# Patient Record
Sex: Male | Born: 1935 | Race: White | Hispanic: No | Marital: Married | Smoking: Former smoker
Health system: Southern US, Community
[De-identification: ages and names within clinical notes are randomized; demographics above are authoritative.]

## PROBLEM LIST (undated history)

## (undated) DIAGNOSIS — G709 Myoneural disorder, unspecified: Secondary | ICD-10-CM

## (undated) DIAGNOSIS — J209 Acute bronchitis, unspecified: Secondary | ICD-10-CM

## (undated) DIAGNOSIS — D649 Anemia, unspecified: Secondary | ICD-10-CM

## (undated) DIAGNOSIS — I509 Heart failure, unspecified: Secondary | ICD-10-CM

## (undated) DIAGNOSIS — I2510 Atherosclerotic heart disease of native coronary artery without angina pectoris: Secondary | ICD-10-CM

## (undated) DIAGNOSIS — N400 Enlarged prostate without lower urinary tract symptoms: Secondary | ICD-10-CM

## (undated) DIAGNOSIS — E039 Hypothyroidism, unspecified: Secondary | ICD-10-CM

## (undated) DIAGNOSIS — C4490 Unspecified malignant neoplasm of skin, unspecified: Secondary | ICD-10-CM

## (undated) DIAGNOSIS — G4730 Sleep apnea, unspecified: Secondary | ICD-10-CM

## (undated) DIAGNOSIS — N189 Chronic kidney disease, unspecified: Secondary | ICD-10-CM

## (undated) DIAGNOSIS — E785 Hyperlipidemia, unspecified: Secondary | ICD-10-CM

## (undated) DIAGNOSIS — I10 Essential (primary) hypertension: Secondary | ICD-10-CM

## (undated) DIAGNOSIS — K219 Gastro-esophageal reflux disease without esophagitis: Secondary | ICD-10-CM

## (undated) DIAGNOSIS — R059 Cough, unspecified: Secondary | ICD-10-CM

## (undated) DIAGNOSIS — IMO0002 Reserved for concepts with insufficient information to code with codable children: Secondary | ICD-10-CM

## (undated) DIAGNOSIS — R05 Cough: Secondary | ICD-10-CM

## (undated) DIAGNOSIS — Z9289 Personal history of other medical treatment: Secondary | ICD-10-CM

## (undated) DIAGNOSIS — J440 Chronic obstructive pulmonary disease with acute lower respiratory infection: Secondary | ICD-10-CM

## (undated) DIAGNOSIS — T82198A Other mechanical complication of other cardiac electronic device, initial encounter: Secondary | ICD-10-CM

## (undated) DIAGNOSIS — C679 Malignant neoplasm of bladder, unspecified: Secondary | ICD-10-CM

## (undated) HISTORY — DX: Heart failure, unspecified: I50.9

## (undated) HISTORY — DX: Atherosclerotic heart disease of native coronary artery without angina pectoris: I25.10

## (undated) HISTORY — PX: CORONARY ANGIOPLASTY: SHX604

## (undated) HISTORY — DX: Chronic kidney disease, unspecified: N18.9

## (undated) HISTORY — PX: COLONOSCOPY: SHX174

## (undated) HISTORY — DX: Essential (primary) hypertension: I10

## (undated) HISTORY — DX: Hyperlipidemia, unspecified: E78.5

## (undated) HISTORY — PX: CARDIAC DEFIBRILLATOR PLACEMENT: SHX171

## (undated) HISTORY — DX: Gastro-esophageal reflux disease without esophagitis: K21.9

## (undated) HISTORY — PX: ESOPHAGOGASTRODUODENOSCOPY: SHX1529

## (undated) HISTORY — DX: Reserved for concepts with insufficient information to code with codable children: IMO0002

## (undated) HISTORY — DX: Other mechanical complication of other cardiac electronic device, initial encounter: T82.198A

---

## 1970-10-14 HISTORY — PX: BACK SURGERY: SHX140

## 1983-10-15 HISTORY — PX: CERVICAL LAMINECTOMY: SHX94

## 1987-10-15 HISTORY — PX: HERNIA REPAIR: SHX51

## 2001-10-26 ENCOUNTER — Encounter: Payer: Self-pay | Admitting: Family Medicine

## 2001-10-26 ENCOUNTER — Encounter: Admission: RE | Admit: 2001-10-26 | Discharge: 2001-10-26 | Payer: Self-pay | Admitting: Family Medicine

## 2001-11-27 ENCOUNTER — Ambulatory Visit (HOSPITAL_COMMUNITY): Admission: RE | Admit: 2001-11-27 | Discharge: 2001-11-27 | Payer: Self-pay | Admitting: Gastroenterology

## 2004-09-15 ENCOUNTER — Inpatient Hospital Stay (HOSPITAL_COMMUNITY): Admission: EM | Admit: 2004-09-15 | Discharge: 2004-09-27 | Payer: Self-pay | Admitting: Emergency Medicine

## 2004-09-15 ENCOUNTER — Ambulatory Visit: Payer: Self-pay | Admitting: *Deleted

## 2004-10-01 ENCOUNTER — Ambulatory Visit: Payer: Self-pay | Admitting: Cardiovascular Disease

## 2004-10-01 ENCOUNTER — Ambulatory Visit: Payer: Self-pay | Admitting: Cardiology

## 2004-10-06 ENCOUNTER — Encounter (HOSPITAL_COMMUNITY): Admission: RE | Admit: 2004-10-06 | Discharge: 2005-01-04 | Payer: Self-pay | Admitting: *Deleted

## 2004-10-09 ENCOUNTER — Ambulatory Visit: Payer: Self-pay | Admitting: Cardiovascular Disease

## 2004-10-10 ENCOUNTER — Ambulatory Visit: Payer: Self-pay | Admitting: *Deleted

## 2004-10-10 ENCOUNTER — Ambulatory Visit: Payer: Self-pay | Admitting: Cardiology

## 2004-10-10 ENCOUNTER — Ambulatory Visit: Payer: Self-pay

## 2004-10-10 ENCOUNTER — Inpatient Hospital Stay (HOSPITAL_COMMUNITY): Admission: AD | Admit: 2004-10-10 | Discharge: 2004-10-13 | Payer: Self-pay | Admitting: Cardiology

## 2004-10-17 ENCOUNTER — Ambulatory Visit: Payer: Self-pay | Admitting: Internal Medicine

## 2004-10-25 ENCOUNTER — Ambulatory Visit: Payer: Self-pay | Admitting: Physician Assistant

## 2004-11-08 ENCOUNTER — Ambulatory Visit: Payer: Self-pay | Admitting: *Deleted

## 2004-11-12 ENCOUNTER — Ambulatory Visit: Payer: Self-pay | Admitting: *Deleted

## 2004-11-19 ENCOUNTER — Ambulatory Visit: Payer: Self-pay | Admitting: *Deleted

## 2004-11-20 ENCOUNTER — Ambulatory Visit: Payer: Self-pay | Admitting: *Deleted

## 2004-11-20 ENCOUNTER — Ambulatory Visit (HOSPITAL_COMMUNITY): Admission: RE | Admit: 2004-11-20 | Discharge: 2004-11-21 | Payer: Self-pay | Admitting: *Deleted

## 2004-12-03 ENCOUNTER — Ambulatory Visit: Payer: Self-pay | Admitting: *Deleted

## 2005-01-01 ENCOUNTER — Ambulatory Visit: Payer: Self-pay | Admitting: *Deleted

## 2005-01-04 ENCOUNTER — Ambulatory Visit: Payer: Self-pay | Admitting: *Deleted

## 2005-01-05 ENCOUNTER — Encounter (HOSPITAL_COMMUNITY): Admission: RE | Admit: 2005-01-05 | Discharge: 2005-04-05 | Payer: Self-pay | Admitting: *Deleted

## 2005-01-07 ENCOUNTER — Encounter (INDEPENDENT_AMBULATORY_CARE_PROVIDER_SITE_OTHER): Payer: Self-pay | Admitting: Specialist

## 2005-01-07 ENCOUNTER — Ambulatory Visit (HOSPITAL_COMMUNITY): Admission: RE | Admit: 2005-01-07 | Discharge: 2005-01-07 | Payer: Self-pay | Admitting: Gastroenterology

## 2005-01-08 ENCOUNTER — Ambulatory Visit: Payer: Self-pay

## 2005-01-08 ENCOUNTER — Ambulatory Visit: Payer: Self-pay | Admitting: *Deleted

## 2005-02-06 ENCOUNTER — Ambulatory Visit: Payer: Self-pay | Admitting: Internal Medicine

## 2005-02-13 ENCOUNTER — Ambulatory Visit: Payer: Self-pay | Admitting: Internal Medicine

## 2005-02-19 ENCOUNTER — Ambulatory Visit: Payer: Self-pay | Admitting: Internal Medicine

## 2005-02-19 ENCOUNTER — Inpatient Hospital Stay (HOSPITAL_COMMUNITY): Admission: AD | Admit: 2005-02-19 | Discharge: 2005-02-20 | Payer: Self-pay | Admitting: Internal Medicine

## 2005-03-04 ENCOUNTER — Ambulatory Visit: Payer: Self-pay | Admitting: *Deleted

## 2005-03-04 ENCOUNTER — Ambulatory Visit: Payer: Self-pay | Admitting: Internal Medicine

## 2005-05-04 ENCOUNTER — Ambulatory Visit: Payer: Self-pay | Admitting: Cardiology

## 2005-05-05 ENCOUNTER — Inpatient Hospital Stay (HOSPITAL_COMMUNITY): Admission: EM | Admit: 2005-05-05 | Discharge: 2005-05-07 | Payer: Self-pay | Admitting: Emergency Medicine

## 2005-05-14 ENCOUNTER — Ambulatory Visit: Payer: Self-pay | Admitting: Internal Medicine

## 2005-05-20 ENCOUNTER — Ambulatory Visit: Admission: RE | Admit: 2005-05-20 | Discharge: 2005-05-20 | Payer: Self-pay | Admitting: Internal Medicine

## 2005-05-21 ENCOUNTER — Ambulatory Visit: Payer: Self-pay

## 2005-06-03 ENCOUNTER — Ambulatory Visit: Payer: Self-pay | Admitting: Internal Medicine

## 2005-06-27 ENCOUNTER — Ambulatory Visit: Payer: Self-pay | Admitting: Internal Medicine

## 2005-08-05 ENCOUNTER — Ambulatory Visit: Payer: Self-pay | Admitting: Internal Medicine

## 2005-08-06 ENCOUNTER — Ambulatory Visit: Payer: Self-pay | Admitting: Internal Medicine

## 2005-09-10 ENCOUNTER — Ambulatory Visit: Payer: Self-pay | Admitting: Internal Medicine

## 2005-09-13 ENCOUNTER — Ambulatory Visit: Payer: Self-pay | Admitting: Cardiovascular Disease

## 2005-12-18 ENCOUNTER — Ambulatory Visit: Payer: Self-pay | Admitting: Internal Medicine

## 2006-01-06 ENCOUNTER — Ambulatory Visit: Payer: Self-pay

## 2006-03-08 ENCOUNTER — Emergency Department (HOSPITAL_COMMUNITY): Admission: EM | Admit: 2006-03-08 | Discharge: 2006-03-08 | Payer: Self-pay | Admitting: Emergency Medicine

## 2006-03-21 ENCOUNTER — Ambulatory Visit: Payer: Self-pay | Admitting: Internal Medicine

## 2006-03-24 ENCOUNTER — Ambulatory Visit: Payer: Self-pay | Admitting: Internal Medicine

## 2006-03-28 ENCOUNTER — Ambulatory Visit: Admission: RE | Admit: 2006-03-28 | Discharge: 2006-03-28 | Payer: Self-pay | Admitting: Internal Medicine

## 2006-04-08 ENCOUNTER — Ambulatory Visit: Payer: Self-pay | Admitting: Internal Medicine

## 2006-04-21 ENCOUNTER — Ambulatory Visit: Payer: Self-pay | Admitting: Internal Medicine

## 2006-04-21 ENCOUNTER — Ambulatory Visit: Payer: Self-pay

## 2006-04-21 ENCOUNTER — Encounter: Payer: Self-pay | Admitting: Cardiology

## 2006-07-15 ENCOUNTER — Ambulatory Visit: Payer: Self-pay | Admitting: Internal Medicine

## 2006-09-23 ENCOUNTER — Ambulatory Visit: Payer: Self-pay | Admitting: Internal Medicine

## 2006-10-27 ENCOUNTER — Ambulatory Visit: Admission: RE | Admit: 2006-10-27 | Discharge: 2006-10-27 | Payer: Self-pay | Admitting: Internal Medicine

## 2006-11-06 ENCOUNTER — Ambulatory Visit: Payer: Self-pay

## 2006-11-10 ENCOUNTER — Ambulatory Visit: Payer: Self-pay | Admitting: Internal Medicine

## 2006-11-13 ENCOUNTER — Ambulatory Visit: Payer: Self-pay

## 2006-11-13 LAB — CONVERTED CEMR LAB
ALT: 47 units/L — ABNORMAL HIGH (ref 0–40)
Alkaline Phosphatase: 65 units/L (ref 39–117)
BUN: 26 mg/dL — ABNORMAL HIGH (ref 6–23)
Bilirubin, Direct: 0.2 mg/dL (ref 0.0–0.3)
Chloride: 107 meq/L (ref 96–112)
Creatinine, Ser: 2.1 mg/dL — ABNORMAL HIGH (ref 0.4–1.5)
Free T4: 1.1 ng/dL (ref 0.6–1.6)
GFR calc Af Amer: 40 mL/min
Glucose, Bld: 109 mg/dL — ABNORMAL HIGH (ref 70–99)
Potassium: 4.7 meq/L (ref 3.5–5.1)
Sodium: 141 meq/L (ref 135–145)
Total CHOL/HDL Ratio: 3.7
Total Protein: 6.5 g/dL (ref 6.0–8.3)
Triglycerides: 75 mg/dL (ref 0–149)

## 2006-12-24 ENCOUNTER — Ambulatory Visit: Payer: Self-pay | Admitting: Internal Medicine

## 2007-01-06 ENCOUNTER — Ambulatory Visit: Payer: Self-pay | Admitting: Internal Medicine

## 2007-02-24 ENCOUNTER — Ambulatory Visit: Payer: Self-pay | Admitting: Internal Medicine

## 2007-03-17 ENCOUNTER — Ambulatory Visit (HOSPITAL_COMMUNITY): Admission: RE | Admit: 2007-03-17 | Discharge: 2007-03-17 | Payer: Self-pay | Admitting: Internal Medicine

## 2007-03-18 ENCOUNTER — Ambulatory Visit: Payer: Self-pay | Admitting: Internal Medicine

## 2007-03-23 ENCOUNTER — Ambulatory Visit: Payer: Self-pay | Admitting: Internal Medicine

## 2007-04-07 ENCOUNTER — Ambulatory Visit: Payer: Self-pay | Admitting: Internal Medicine

## 2007-07-07 ENCOUNTER — Ambulatory Visit: Payer: Self-pay | Admitting: Internal Medicine

## 2007-08-18 ENCOUNTER — Ambulatory Visit: Payer: Self-pay | Admitting: Internal Medicine

## 2007-08-18 LAB — CONVERTED CEMR LAB
Digitoxin Lvl: 0.9 ng/mL (ref 0.8–2.0)
T3, Free: 2.4 pg/mL (ref 2.3–4.2)

## 2007-08-19 ENCOUNTER — Encounter: Payer: Self-pay | Admitting: Internal Medicine

## 2007-08-19 ENCOUNTER — Ambulatory Visit: Admission: RE | Admit: 2007-08-19 | Discharge: 2007-08-19 | Payer: Self-pay | Admitting: Internal Medicine

## 2007-08-19 ENCOUNTER — Ambulatory Visit: Payer: Self-pay

## 2007-08-25 ENCOUNTER — Ambulatory Visit: Payer: Self-pay | Admitting: Internal Medicine

## 2007-08-25 LAB — CONVERTED CEMR LAB
Creatinine, Ser: 1.6 mg/dL — ABNORMAL HIGH (ref 0.4–1.5)
GFR calc Af Amer: 55 mL/min
GFR calc non Af Amer: 45 mL/min
Potassium: 4.3 meq/L (ref 3.5–5.1)

## 2007-08-28 ENCOUNTER — Ambulatory Visit (HOSPITAL_COMMUNITY): Admission: RE | Admit: 2007-08-28 | Discharge: 2007-08-28 | Payer: Self-pay | Admitting: Internal Medicine

## 2007-09-04 ENCOUNTER — Ambulatory Visit (HOSPITAL_COMMUNITY): Admission: RE | Admit: 2007-09-04 | Discharge: 2007-09-04 | Payer: Self-pay | Admitting: Internal Medicine

## 2007-09-24 ENCOUNTER — Ambulatory Visit: Payer: Self-pay | Admitting: Internal Medicine

## 2007-10-06 ENCOUNTER — Emergency Department (HOSPITAL_COMMUNITY): Admission: EM | Admit: 2007-10-06 | Discharge: 2007-10-06 | Payer: Self-pay | Admitting: Emergency Medicine

## 2007-10-12 ENCOUNTER — Ambulatory Visit: Payer: Self-pay | Admitting: Cardiology

## 2007-10-12 LAB — CONVERTED CEMR LAB
BUN: 22 mg/dL (ref 6–23)
Calcium: 9.1 mg/dL (ref 8.4–10.5)
Chloride: 105 meq/L (ref 96–112)
GFR calc non Af Amer: 40 mL/min
Glucose, Bld: 106 mg/dL — ABNORMAL HIGH (ref 70–99)

## 2007-10-13 ENCOUNTER — Ambulatory Visit: Payer: Self-pay

## 2007-10-19 ENCOUNTER — Ambulatory Visit: Payer: Self-pay | Admitting: Internal Medicine

## 2007-10-19 LAB — CONVERTED CEMR LAB
CO2: 29 meq/L (ref 19–32)
Calcium: 8.8 mg/dL (ref 8.4–10.5)
Chloride: 104 meq/L (ref 96–112)
Creatinine, Ser: 1.8 mg/dL — ABNORMAL HIGH (ref 0.4–1.5)
Glucose, Bld: 104 mg/dL — ABNORMAL HIGH (ref 70–99)
Potassium: 4.6 meq/L (ref 3.5–5.1)
Pro B Natriuretic peptide (BNP): 224 pg/mL — ABNORMAL HIGH (ref 0.0–100.0)
Sodium: 138 meq/L (ref 135–145)

## 2007-11-02 ENCOUNTER — Ambulatory Visit: Payer: Self-pay | Admitting: Internal Medicine

## 2007-11-02 LAB — CONVERTED CEMR LAB
AST: 53 units/L — ABNORMAL HIGH (ref 0–37)
Albumin: 3.3 g/dL — ABNORMAL LOW (ref 3.5–5.2)
BUN: 25 mg/dL — ABNORMAL HIGH (ref 6–23)
Bilirubin, Direct: 0.2 mg/dL (ref 0.0–0.3)
CO2: 28 meq/L (ref 19–32)
Chloride: 103 meq/L (ref 96–112)
GFR calc Af Amer: 42 mL/min
Glucose, Bld: 112 mg/dL — ABNORMAL HIGH (ref 70–99)
Potassium: 4.8 meq/L (ref 3.5–5.1)
Sodium: 138 meq/L (ref 135–145)
Triglycerides: 85 mg/dL (ref 0–149)
VLDL: 17 mg/dL (ref 0–40)

## 2007-12-21 ENCOUNTER — Ambulatory Visit: Payer: Self-pay | Admitting: Internal Medicine

## 2008-01-25 ENCOUNTER — Ambulatory Visit: Payer: Self-pay | Admitting: Internal Medicine

## 2008-01-25 LAB — CONVERTED CEMR LAB
Basophils Absolute: 0.1 10*3/uL (ref 0.0–0.1)
Basophils Relative: 1.2 % — ABNORMAL HIGH (ref 0.0–1.0)
CO2: 30 meq/L (ref 19–32)
Calcium: 8.8 mg/dL (ref 8.4–10.5)
Creatinine, Ser: 1.6 mg/dL — ABNORMAL HIGH (ref 0.4–1.5)
Eosinophils Relative: 4 % (ref 0.0–5.0)
GFR calc Af Amer: 55 mL/min
Glucose, Bld: 86 mg/dL (ref 70–99)
Hemoglobin: 12.9 g/dL — ABNORMAL LOW (ref 13.0–17.0)
INR: 1 (ref 0.8–1.0)
Monocytes Absolute: 0.8 10*3/uL (ref 0.1–1.0)
Monocytes Relative: 13.9 % — ABNORMAL HIGH (ref 3.0–12.0)
Platelets: 228 10*3/uL (ref 150–400)
Prothrombin Time: 12.2 s (ref 10.9–13.3)
RDW: 13.2 % (ref 11.5–14.6)
WBC: 5.7 10*3/uL (ref 4.5–10.5)

## 2008-01-29 ENCOUNTER — Inpatient Hospital Stay (HOSPITAL_COMMUNITY): Admission: AD | Admit: 2008-01-29 | Discharge: 2008-01-31 | Payer: Self-pay | Admitting: Internal Medicine

## 2008-01-29 ENCOUNTER — Ambulatory Visit: Payer: Self-pay | Admitting: Internal Medicine

## 2008-02-11 ENCOUNTER — Ambulatory Visit: Payer: Self-pay | Admitting: Internal Medicine

## 2008-02-11 ENCOUNTER — Ambulatory Visit: Payer: Self-pay

## 2008-02-11 LAB — CONVERTED CEMR LAB
BUN: 23 mg/dL (ref 6–23)
Creatinine, Ser: 1.5 mg/dL (ref 0.4–1.5)
GFR calc Af Amer: 59 mL/min
GFR calc non Af Amer: 49 mL/min
Glucose, Bld: 114 mg/dL — ABNORMAL HIGH (ref 70–99)
Sodium: 141 meq/L (ref 135–145)

## 2008-04-13 ENCOUNTER — Ambulatory Visit: Payer: Self-pay | Admitting: Internal Medicine

## 2008-04-19 ENCOUNTER — Ambulatory Visit: Payer: Self-pay | Admitting: Internal Medicine

## 2008-04-25 ENCOUNTER — Ambulatory Visit: Payer: Self-pay | Admitting: Internal Medicine

## 2008-04-25 LAB — CONVERTED CEMR LAB
AST: 64 units/L — ABNORMAL HIGH (ref 0–37)
BUN: 23 mg/dL (ref 6–23)
Bilirubin, Direct: 0.1 mg/dL (ref 0.0–0.3)
CO2: 30 meq/L (ref 19–32)
Calcium: 8.8 mg/dL (ref 8.4–10.5)
Creatinine, Ser: 1.5 mg/dL (ref 0.4–1.5)
Digitoxin Lvl: 1.3 ng/mL (ref 0.8–2.0)
Glucose, Bld: 117 mg/dL — ABNORMAL HIGH (ref 70–99)
Potassium: 5 meq/L (ref 3.5–5.1)
TSH: 0.29 microintl units/mL — ABNORMAL LOW (ref 0.35–5.50)
Total Protein: 6.6 g/dL (ref 6.0–8.3)

## 2008-04-28 ENCOUNTER — Ambulatory Visit: Payer: Self-pay | Admitting: Internal Medicine

## 2008-04-28 LAB — CONVERTED CEMR LAB
Free T4: 1.7 ng/dL — ABNORMAL HIGH (ref 0.6–1.6)
T3, Free: 3.8 pg/mL (ref 2.3–4.2)

## 2008-06-28 ENCOUNTER — Ambulatory Visit: Payer: Self-pay | Admitting: Internal Medicine

## 2008-06-28 LAB — CONVERTED CEMR LAB
Free T4: 2.6 ng/dL — ABNORMAL HIGH (ref 0.6–1.6)
T3, Free: 3.9 pg/mL (ref 2.3–4.2)

## 2008-07-26 ENCOUNTER — Ambulatory Visit: Payer: Self-pay | Admitting: Internal Medicine

## 2008-08-01 ENCOUNTER — Ambulatory Visit (HOSPITAL_COMMUNITY): Admission: RE | Admit: 2008-08-01 | Discharge: 2008-08-01 | Payer: Self-pay | Admitting: Internal Medicine

## 2008-08-01 ENCOUNTER — Ambulatory Visit: Payer: Self-pay | Admitting: Internal Medicine

## 2008-08-03 ENCOUNTER — Encounter (HOSPITAL_COMMUNITY): Admission: RE | Admit: 2008-08-03 | Discharge: 2008-10-11 | Payer: Self-pay | Admitting: Internal Medicine

## 2008-09-23 ENCOUNTER — Ambulatory Visit: Payer: Self-pay | Admitting: Internal Medicine

## 2008-09-23 LAB — CONVERTED CEMR LAB: TSH: 0.05 microintl units/mL — ABNORMAL LOW (ref 0.35–5.50)

## 2008-09-27 ENCOUNTER — Ambulatory Visit: Payer: Self-pay | Admitting: Internal Medicine

## 2008-10-19 ENCOUNTER — Ambulatory Visit: Payer: Self-pay | Admitting: Internal Medicine

## 2008-10-31 ENCOUNTER — Ambulatory Visit: Payer: Self-pay | Admitting: Internal Medicine

## 2008-12-06 ENCOUNTER — Ambulatory Visit: Payer: Self-pay

## 2009-01-21 DIAGNOSIS — I4891 Unspecified atrial fibrillation: Secondary | ICD-10-CM

## 2009-01-21 DIAGNOSIS — Z95810 Presence of automatic (implantable) cardiac defibrillator: Secondary | ICD-10-CM

## 2009-01-24 ENCOUNTER — Encounter: Payer: Self-pay | Admitting: Internal Medicine

## 2009-01-24 ENCOUNTER — Ambulatory Visit: Payer: Self-pay | Admitting: Internal Medicine

## 2009-01-24 DIAGNOSIS — I255 Ischemic cardiomyopathy: Secondary | ICD-10-CM | POA: Insufficient documentation

## 2009-02-01 ENCOUNTER — Encounter: Payer: Self-pay | Admitting: Internal Medicine

## 2009-02-01 ENCOUNTER — Ambulatory Visit: Payer: Self-pay | Admitting: Internal Medicine

## 2009-02-01 DIAGNOSIS — I2510 Atherosclerotic heart disease of native coronary artery without angina pectoris: Secondary | ICD-10-CM

## 2009-04-24 ENCOUNTER — Ambulatory Visit: Payer: Self-pay | Admitting: Internal Medicine

## 2009-04-24 DIAGNOSIS — G629 Polyneuropathy, unspecified: Secondary | ICD-10-CM | POA: Insufficient documentation

## 2009-04-24 DIAGNOSIS — K219 Gastro-esophageal reflux disease without esophagitis: Secondary | ICD-10-CM | POA: Insufficient documentation

## 2009-04-24 DIAGNOSIS — G4700 Insomnia, unspecified: Secondary | ICD-10-CM | POA: Insufficient documentation

## 2009-05-01 ENCOUNTER — Encounter: Payer: Self-pay | Admitting: Internal Medicine

## 2009-05-10 ENCOUNTER — Telehealth: Payer: Self-pay | Admitting: Internal Medicine

## 2009-05-16 ENCOUNTER — Ambulatory Visit: Payer: Self-pay

## 2009-05-16 ENCOUNTER — Encounter: Payer: Self-pay | Admitting: Internal Medicine

## 2009-05-25 ENCOUNTER — Ambulatory Visit: Payer: Self-pay | Admitting: Internal Medicine

## 2009-05-25 DIAGNOSIS — E782 Mixed hyperlipidemia: Secondary | ICD-10-CM

## 2009-05-26 ENCOUNTER — Telehealth: Payer: Self-pay | Admitting: Internal Medicine

## 2009-06-01 ENCOUNTER — Telehealth: Payer: Self-pay | Admitting: Internal Medicine

## 2009-06-05 ENCOUNTER — Telehealth: Payer: Self-pay | Admitting: Internal Medicine

## 2009-06-07 ENCOUNTER — Ambulatory Visit: Payer: Self-pay | Admitting: Internal Medicine

## 2009-06-08 ENCOUNTER — Encounter: Payer: Self-pay | Admitting: Internal Medicine

## 2009-06-14 ENCOUNTER — Encounter: Payer: Self-pay | Admitting: Internal Medicine

## 2009-06-14 ENCOUNTER — Telehealth: Payer: Self-pay | Admitting: Internal Medicine

## 2009-06-21 ENCOUNTER — Telehealth: Payer: Self-pay | Admitting: Internal Medicine

## 2009-06-26 DIAGNOSIS — E039 Hypothyroidism, unspecified: Secondary | ICD-10-CM | POA: Insufficient documentation

## 2009-08-08 ENCOUNTER — Ambulatory Visit: Payer: Self-pay | Admitting: Internal Medicine

## 2009-08-08 DIAGNOSIS — R079 Chest pain, unspecified: Secondary | ICD-10-CM | POA: Insufficient documentation

## 2009-09-14 ENCOUNTER — Ambulatory Visit: Payer: Self-pay | Admitting: Internal Medicine

## 2009-09-14 DIAGNOSIS — I5022 Chronic systolic (congestive) heart failure: Secondary | ICD-10-CM

## 2009-09-22 ENCOUNTER — Ambulatory Visit: Payer: Self-pay | Admitting: Internal Medicine

## 2009-09-22 DIAGNOSIS — I10 Essential (primary) hypertension: Secondary | ICD-10-CM | POA: Insufficient documentation

## 2009-09-25 LAB — CONVERTED CEMR LAB
Calcium: 8.8 mg/dL (ref 8.4–10.5)
Creatinine, Ser: 1.4 mg/dL (ref 0.4–1.5)
Sodium: 141 meq/L (ref 135–145)

## 2009-10-18 ENCOUNTER — Telehealth: Payer: Self-pay | Admitting: Internal Medicine

## 2009-10-27 ENCOUNTER — Telehealth: Payer: Self-pay | Admitting: Internal Medicine

## 2009-11-05 ENCOUNTER — Encounter: Payer: Self-pay | Admitting: Internal Medicine

## 2009-11-06 ENCOUNTER — Ambulatory Visit: Payer: Self-pay | Admitting: Internal Medicine

## 2009-11-15 ENCOUNTER — Encounter: Payer: Self-pay | Admitting: Internal Medicine

## 2010-01-10 ENCOUNTER — Telehealth: Payer: Self-pay | Admitting: Internal Medicine

## 2010-01-16 ENCOUNTER — Ambulatory Visit: Payer: Self-pay | Admitting: Internal Medicine

## 2010-02-04 ENCOUNTER — Encounter: Payer: Self-pay | Admitting: Internal Medicine

## 2010-02-05 ENCOUNTER — Telehealth: Payer: Self-pay | Admitting: Internal Medicine

## 2010-02-05 ENCOUNTER — Ambulatory Visit: Payer: Self-pay | Admitting: Internal Medicine

## 2010-02-09 ENCOUNTER — Encounter: Payer: Self-pay | Admitting: Internal Medicine

## 2010-05-08 ENCOUNTER — Telehealth (INDEPENDENT_AMBULATORY_CARE_PROVIDER_SITE_OTHER): Payer: Self-pay | Admitting: *Deleted

## 2010-05-11 ENCOUNTER — Encounter: Payer: Self-pay | Admitting: Internal Medicine

## 2010-05-17 ENCOUNTER — Ambulatory Visit: Payer: Self-pay | Admitting: Internal Medicine

## 2010-05-17 ENCOUNTER — Encounter: Payer: Self-pay | Admitting: Internal Medicine

## 2010-05-30 ENCOUNTER — Ambulatory Visit: Payer: Self-pay | Admitting: Internal Medicine

## 2010-05-31 ENCOUNTER — Encounter: Payer: Self-pay | Admitting: Internal Medicine

## 2010-07-24 ENCOUNTER — Ambulatory Visit: Payer: Self-pay | Admitting: Internal Medicine

## 2010-09-04 ENCOUNTER — Encounter: Payer: Self-pay | Admitting: Internal Medicine

## 2010-09-04 DIAGNOSIS — I739 Peripheral vascular disease, unspecified: Secondary | ICD-10-CM | POA: Insufficient documentation

## 2010-09-13 ENCOUNTER — Ambulatory Visit: Payer: Self-pay | Admitting: Internal Medicine

## 2010-09-14 ENCOUNTER — Encounter: Payer: Self-pay | Admitting: Internal Medicine

## 2010-09-19 ENCOUNTER — Ambulatory Visit: Payer: Self-pay | Admitting: Internal Medicine

## 2010-09-20 ENCOUNTER — Encounter: Payer: Self-pay | Admitting: Internal Medicine

## 2010-10-25 ENCOUNTER — Encounter: Payer: Self-pay | Admitting: Internal Medicine

## 2010-10-25 ENCOUNTER — Ambulatory Visit
Admission: RE | Admit: 2010-10-25 | Discharge: 2010-10-25 | Payer: Self-pay | Source: Home / Self Care | Attending: Internal Medicine | Admitting: Internal Medicine

## 2010-11-11 LAB — CONVERTED CEMR LAB
AST: 20 units/L (ref 0–37)
Albumin: 3.8 g/dL (ref 3.5–5.2)
BUN: 23 mg/dL (ref 6–23)
Bilirubin, Direct: 0.2 mg/dL (ref 0.0–0.3)
Calcium: 8.9 mg/dL (ref 8.4–10.5)
Chloride: 105 meq/L (ref 96–112)
Creatinine, Ser: 1.5 mg/dL (ref 0.4–1.5)
Creatinine, Ser: 1.5 mg/dL (ref 0.4–1.5)
Glucose, Bld: 96 mg/dL (ref 70–99)
Potassium: 4.4 meq/L (ref 3.5–5.1)
Pro B Natriuretic peptide (BNP): 149 pg/mL — ABNORMAL HIGH (ref 0.0–100.0)
Sodium: 141 meq/L (ref 135–145)
TSH: 1.12 microintl units/mL (ref 0.35–5.50)
Total Protein: 6.7 g/dL (ref 6.0–8.3)

## 2010-11-13 NOTE — Progress Notes (Signed)
Summary: noise on ICD  Phone Note Outgoing Call Call back at [facility] Phone [phone]   Call placed by: Chanetta Marshall RN BSN,  February 05, 2010 10:49 AM Summary of Call: Spoke with patient's wife.  Noise on ICD leads that corresponded with appt at [facility] with Dr Link Snuffer.  During procedure, noise was picked up that inhibited pacing and pt had an aborted shock for sensing of close intervals.  Wife aware that they should use a magnet from now on during these procedures to help prevent the possibility of inappropriate therapy.  Called patient and left message on machine for [facility] to call to discuss. Chanetta Marshall RN BSN  February 05, 2010 10:50 AM   Follow-up for Phone Call        [facility].  she is aware of issues.  Chanetta Marshall RN BSN  February 05, 2010 11:13 AM

## 2010-11-13 NOTE — Cardiovascular Report (Signed)
Summary: Office Visit Remote   Office Visit Remote   Imported By: Sallee Provencal 11/20/2009 16:40:27  _____________________________________________________________________  External Attachment:    Type:   Image     Comment:   External Document

## 2010-11-13 NOTE — Progress Notes (Signed)
Summary: needs help with transmission  Phone Note Call from Patient Call back at Home Phone [phone]   Caller: Spouse Reason for Call: Talk to Nurse, Talk to Doctor Summary of Call: pt got a letter regarding his device check and is a little confused at how he is suppose do it and needs a call Initial call taken by: Shelda Pal,  May 08, 2010 12:54 PM  Follow-up for Phone Call        spoke with wife, patient has wireless device and will transmitt automatically on the 28th.   Follow-up by: Alma Friendly, LPN,  July 26, 624THL 624THL PM

## 2010-11-13 NOTE — Miscellaneous (Signed)
Summary: Orders Update pft charges  Clinical Lists Changes  Orders: Added new Service order of Carbon Monoxide diffusing w/capacity (94720) - Signed Added new Service order of Lung Volumes (94240) - Signed Added new Service order of Spirometry (Pre & Post) (94060) - Signed 

## 2010-11-13 NOTE — Assessment & Plan Note (Signed)
Summary: per check out/sf   Visit Type:  Follow-up Referring Provider:  Rhylen Shaheen Primary Provider:  [name]  CC:  sob only with exertion.  History of Present Illness: [name] is delightful 77 year old male with a history of coronary artery disease, status post previous large anterior wall myocardial infarction.  This has been complicated by congestive heart failure.  Ejection fraction of 25%.  He is status post placement of a St. Jude BiV ICD.  He also has had multivessel stenting in the past. Remainder of his medical history is notable for atrial fibrillation,maintaining sinus rhythm on amiodarone.  He is not on Coumadin secondary to GI bleed, chronic renal insufficiency with baseline creatinine about 1.5-2.0, hypertension, hyperlipidemia, and a systemic tremor. He returns today with his wife for routine f/u.   At his last visit we increased Coreg. He was able to do this slowly without too much problem. Previously unable to tolerate increase in Diovan due to near syncope.  Continues to do well. Can walk around [facility] 3-4x without problem. Tolerating meds well. No orthopnea, PND or edema. No ICD shocks. Wife feels he is doing much better. mild exertional dyspnea.  Current Medications (verified): 1)  Pacerone 200 Mg Tabs (Amiodarone Hcl) .... Take 1/2 Tablet By Mouth Once Daily 2)  Carvedilol 6.25 Mg Tabs (Carvedilol) .... Take 2 By Mouth Two Times A Day 3)  Lasix 20 Mg Tabs (Furosemide) .... Take One Tablet By Mouth Once Daily. 4)  Nitroglycerin 0.4 Mg Subl (Nitroglycerin) .... Place 1 Tablet Under Tongue As Directed 5)  Potassium Chloride Cr 10 Meq Cr-Tabs (Potassium Chloride) .... Take 1 Tablet By Mouth Once A  Day 6)  Lanoxin 0.125 Mg Tabs (Digoxin) .... 1/2 Tab Once Daily 7)  Lipitor 80 Mg Tabs (Atorvastatin Calcium) .Marland Kitchen.. 1 Tab Once Daily (Changing To Crestor) 8)  Plavix 75 Mg Tabs (Clopidogrel Bisulfate) .Marland Kitchen.. 1 Tab Once Daily 9)  Protonix 40 Mg Tbec (Pantoprazole Sodium) .... Take 1  By Mouth Once Daily 10)  Zetia 10 Mg Tabs (Ezetimibe) .Marland Kitchen.. 1 Once Daily 11)  Centrum Silver  Tabs (Multiple Vitamins-Minerals) .Marland Kitchen.. 1 Tab Once Daily 12)  Cvs Iron 325 (65 Fe) Mg Tabs (Ferrous Sulfate) .... 3 Times A Week 13)  Diovan 80 Mg Tabs (Valsartan) .... Take 1 Tablet By Mouth Once A Day 14)  Aspirin Ec 325 Mg Tbec (Aspirin) .... Take One Tablet By Mouth Daily 15)  Synthroid 50 Mcg Tabs (Levothyroxine Sodium) .... Take 1 By Mouth Once Daily  Allergies (verified): 1)  ! Amoxicillin 2)  ! Penicillin 3)  Altace  Past History:  Past Medical History: Last updated: 09/14/2009 1. CAD    a. s/p anterior MI 12/05 c/b shock -> stent LAD    b. s/p stenting OM-1, 2/06 2. CHF due to ischemic CM    a. EF 20-30%. (Nov 2008)    b. s/p St. Jude BiV-ICD    c. CPX 07/2008  pvo2 16.3 (63% predicted) slope 34 RER 1.08 O2 pulse 93% 3. h/o AF maintaining sinus on amiodarone     --PFTS, TFTs ok 8/10 4. h/o large GIB - refuses coumadin 5. CRI (baseline 2.0-2.2) 6. HTN 7. Hyperlipidemia 8. GERD        Review of Systems       As per HPI and past medical history; otherwise all systems negative.   Vital Signs:  Patient profile:   77 year old male Height:      71 inches Weight:  184 pounds BMI:     25.76 Pulse rate:   74 / minute BP sitting:   122 / 60  (left arm) Cuff size:   regular  Vitals Entered By: Mignon Pine, RMA (January 16, 2010 9:19 AM)  Physical Exam  General:  General:  Gen: well appearing. no resp difficulty HEENT: normal Neck: supple. JVP 5-6. Carotids 2+ bilat; no bruits. Cor: PMI laterally displaced. Regular rate & rhythm. No rubs, gallops, 2/6 SEM murmur at apex Lungs: clear with decreased air movement (mild) Abdomen: soft, nontender, nondistended. No hepatosplenomegaly. No bruits or masses. Good bowel sounds. Extremities: no cyanosis, clubbing, rash, edema Neuro: alert & orientedx3, cranial nerves grossly intact. moves all 4 extremities w/o difficulty.  affect pleasant     ICD Specifications Following MD:  Virl Axe, MD     ICD Vendor:  St Jude     ICD Model Number:  [id number]     ICD Serial Number:  [id number] ICD DOI:  01/29/2008     ICD Implanting MD:  Virl Axe, MD  Lead 1:    Location: RA     DOI: 02/19/2005     Model #: [id number]     Serial #: [id number]     Status: active Lead 2:    Location: RV     DOI: 02/19/2005     Model #: [id number]     Serial #: [id number]     Status: active Lead 3:    Location: LV     DOI: 01/29/2008     Model #: [id number]     Serial #: [id number]     Status: active  Indications::  ICM, CHF   ICD Follow Up ICD Dependent:  No       ICD Device Measurements Configuration: BIPOLAR  Episodes Coumadin:  No  Brady Parameters Mode DDDR     Lower Rate Limit:  60     Upper Rate Limit 120 PAV 180     Sensed AV Delay:  130 Rate Response Parameters:  SLOPE >12 FOR LACK OF ENERGY  Tachy Zones VF:  250     VT:  210     VT1:  171     Impression & Recommendations:  Problem # 1:  SYSTOLIC HEART FAILURE, CHRONIC (ICD-428.22) Doing very well. NYHA Class II. Volume status looks good. Continue current meds. Unable to titrate Diovan due to hypotension. Will attempt to titrate Coreg slowly again at next visit.  Problem # 2:  CAD, NATIVE VESSEL (ICD-414.01) Stable. No evidence of ischemia. Continue current regimen.  Problem # 3:  ATRIAL FIBRILLATION (ICD-427.31) Doing well. Maintaining SR on amio. Surveillance up to date. not coumadin candidate due to GIB. Continue ASA/plavix.  Patient Instructions: 1)  Follow up in 4 months

## 2010-11-13 NOTE — Letter (Signed)
Summary: Device-Delinquent Phone Proofreader, [place]  [address] [place]   [place], [place] [address]   Phone: [phone]  Fax: [phone]     May 11, 2010 MRN: [id number]   [name], [place]  [id number]   Dear [name],  According to our records, you were scheduled for a device phone transmission on 05-10-2010.     We did not receive any results from this check.  If you transmitted on your scheduled day, please call us to help troubleshoot your system.  If you forgot to send your transmission, please send one upon receipt of this letter.  Thank you,   [facility]

## 2010-11-13 NOTE — Progress Notes (Signed)
Summary: pt having dizzness from diovan  Phone Note Call from Patient Call back at Home Phone [phone]   Caller: Patient Summary of Call: pt is having dizzness and he is taking diovan and the pt want to go back to taking  1 a day and stop taking 1 1/2 of diovan pt would like a call today. Initial call taken by: Delsa Sale,  October 27, 2009 4:30 PM  Follow-up for Phone Call        pt has been having some dizzy spells, BP has been ok, per Dr Haroldine Laws ok to go back to diovan 80mg  daily, pts wife aware Kevan Rosebush, RN  October 27, 2009 4:47 PM     New/Updated Medications: DIOVAN 80 MG TABS (VALSARTAN) Take 1 tablet by mouth once a day

## 2010-11-13 NOTE — Progress Notes (Signed)
Summary: prior auth for diovan  Phone Note Outgoing Call   Summary of Call: received prior auth for Diovan from phamacy, called [employer] at [phone] and it was approved, pharmacy and pt's wife aware  Initial call taken by: Kevan Rosebush, RN,  October 18, 2009 4:52 PM   New Allergies: ALTACE New Allergies: ALTACE

## 2010-11-13 NOTE — Procedures (Signed)
Summary: Cardiology Device Clinic   Allergies: 1)  ! Amoxicillin 2)  ! Penicillin 3)  Altace   ICD Specifications Following MD:  Virl Axe, MD     ICD Vendor:  St Jude     ICD Model Number:  [id number]     ICD Serial Number:  [id number] ICD DOI:  01/29/2008     ICD Implanting MD:  Virl Axe, MD  Lead 1:    Location: RA     DOI: 02/19/2005     Model #: [id number]     Serial #: [id number]     Status: active Lead 2:    Location: RV     DOI: 02/19/2005     Model #: [id number]     Serial #: [id number]     Status: active Lead 3:    Location: LV     DOI: 01/29/2008     Model #: [id number]     Serial #: [id number]     Status: active  Indications::  ICM, CHF   ICD Follow Up Remote Check?  No Battery Voltage:  2.93 V     Charge Time:  11.3 seconds     Battery Est. Longevity:  2.9 years Underlying rhythm:  Brady ICD Dependent:  No       ICD Device Measurements Atrium:  Amplitude: 1.9 mV, Impedance: 490 ohms, Threshold: 0.75 V at 0.5 msec Right Ventricle:  Amplitude: 10.3 mV, Impedance: 400 ohms, Threshold: 1.5 V at 0.5 msec Left Ventricle:  Impedance: 750 ohms, Threshold: 0.75 V at 0.8 msec Configuration: BIPOLAR Shock Impedance: 50 ohms   Episodes MS Episodes:  1     Percent Mode Switch:  <1%     Coumadin:  No Shock:  0     ATP:  0     Nonsustained:  0      Brady Parameters Mode DDDR     Lower Rate Limit:  60     Upper Rate Limit 120 PAV 180     Sensed AV Delay:  130 Rate Response Parameters:  SLOPE >12 FOR LACK OF ENERGY  Tachy Zones VF:  250     VT:  210     VT1:  171     Next Remote Date:  08/16/2010     Tech Comments:  RV reprogrammed 3.0@ 0.5.  Merlin transmissions every 3 months.   Alma Friendly, LPN  August  4, 624THL 11:58 AM

## 2010-11-13 NOTE — Letter (Signed)
Summary: Remote Device Check  [employer] [place]  [id number] [place]   [place], [place] [address]   Phone: [phone]  Fax: [phone]     November 15, 2009 MRN: [id number]   [name],   [id number]   Dear [name],   Your remote transmission was recieved and reviewed by your physician.  All diagnostics were within normal limits for you.  __X___Your next transmission is scheduled for:   February 05, 2010.  Please transmit at any time this day.  If you have a wireless device your transmission will be sent automatically.      Sincerely,  Hotel manager

## 2010-11-13 NOTE — Progress Notes (Signed)
Summary: PHARMACY CALLING WITH QUESTIONS ABOUT LIPITOR  Phone Note From Pharmacy   Caller: [facility]/ [phone] OPT 1 Summary of Call: PHARMACY HAVE QUESTION ABOUT LIPITOR 80MG  Initial call taken by: Delsa Sale,  January 10, 2010 11:12 AM  Follow-up for Phone Call        needs to try Lovastatin, Crestor,Vytorin, or Pravastatin before ins will pay for lipitor  Ref # [id number] Follow-up by: Mignon Pine, RMA,  January 10, 2010 1:10 PM  Additional Follow-up for Phone Call Additional follow up Details #1::        per Dr Haroldine Laws crestor probably ok but needs to come from Dr Virgina Jock who has been following cholesterol, [facility] aware they will cancel order for Lipitor, pts wife aware she will contact Dr [place] [employer] office for further recommendations Kevan Rosebush, RN  January 11, 2010 2:27 PM

## 2010-11-13 NOTE — Assessment & Plan Note (Signed)
Summary: device/saf   Visit Type:  Follow-up Referring Provider:  Bensimhon Primary Provider:  [name]   History of Present Illness:  [name] is seen in followup for congestive heart failure and is in ischemic heart disease with anterior MI w d  left ventricular functioe stimated at 25% He is status post CRT-D implantation.  He also has a history of paroxysmal atrial fibrillation for which he takes amiodarone  surveilllance underrtaken in Aug showed normal LFT, TSH as well as corrected DLCO.  Dr Db plans to recheck in a few months  The patient denies SOB, chest pain, edema or palpitations          Current Medications (verified): 1)  Pacerone 200 Mg Tabs (Amiodarone Hcl) .... Take 1/2 Tablet By Mouth Once Daily 2)  Carvedilol 6.25 Mg Tabs (Carvedilol) .... Take 2 By Mouth Two Times A Day 3)  Lasix 20 Mg Tabs (Furosemide) .... Take One Tablet By Mouth Once Daily. 4)  Nitroglycerin 0.4 Mg Subl (Nitroglycerin) .... Place 1 Tablet Under Tongue As Directed 5)  Potassium Chloride Cr 10 Meq Cr-Tabs (Potassium Chloride) .... Take 1 Tablet By Mouth Once A  Day 6)  Lanoxin 0.125 Mg Tabs (Digoxin) .... 1/2 Tab Once Daily 7)  Crestor 20 Mg Tabs (Rosuvastatin Calcium) .... Take One Tablet By Mouth Daily. 8)  Plavix 75 Mg Tabs (Clopidogrel Bisulfate) .Marland Kitchen.. 1 Tab Once Daily 9)  Protonix 40 Mg Tbec (Pantoprazole Sodium) .... Take 1 By Mouth Once Daily 10)  Zetia 10 Mg Tabs (Ezetimibe) .Marland Kitchen.. 1 Once Daily 11)  Centrum Silver  Tabs (Multiple Vitamins-Minerals) .Marland Kitchen.. 1 Tab Once Daily 12)  Cvs Iron 325 (65 Fe) Mg Tabs (Ferrous Sulfate) .... 3 Times A Week 13)  Diovan 80 Mg Tabs (Valsartan) .... Take 1 Tablet By Mouth Once A Day 14)  Aspirin Ec 325 Mg Tbec (Aspirin) .... Take One Tablet By Mouth Daily 15)  Synthroid 50 Mcg Tabs (Levothyroxine Sodium) .... Take 1 By Mouth Once Daily  Allergies: 1)  ! Amoxicillin 2)  ! Penicillin 3)  Altace  Past History:  Past Medical History: Last  updated: 09/14/2009 1. CAD    a. s/p anterior MI 12/05 c/b shock -> stent LAD    b. s/p stenting OM-1, 2/06 2. CHF due to ischemic CM    a. EF 20-30%. (Nov 2008)    b. s/p St. Jude BiV-ICD    c. CPX 07/2008  pvo2 16.3 (63% predicted) slope 34 RER 1.08 O2 pulse 93% 3. h/o AF maintaining sinus on amiodarone     --PFTS, TFTs ok 8/10 4. h/o large GIB - refuses coumadin 5. CRI (baseline 2.0-2.2) 6. HTN 7. Hyperlipidemia 8. GERD        Vital Signs:  Patient profile:   77 year old male Height:      71 inches Weight:      179 pounds BMI:     25.06 Pulse rate:   60 / minute BP sitting:   104 / 62  (left arm)  Vitals Entered By: Margaretmary Bayley CMA (July 24, 2010 11:13 AM)  Physical Exam  General:  well appearing. no resp difficulty HEENT: normal Neck: supple. JVP flatCarotids 2+ bilat; no bruits. Cor: PMI laterally displaced. Regular rate & rhythm. No rubs, gallops, 2/6 SEM murmur at apex Lungs: clear with decreased air movement (mild) Abdomen: soft, nontender, nondistended. No hepatosplenomegaly. No bruits or masses. Good bowel sounds. Extremities: no cyanosis, clubbing, rash, edema Neuro: alert & orientedx3, cranial nerves grossly  intact. moves all 4 extremities w/o difficulty. affect pleasant device pocket somewhat retracted but skin moves easily acroos the surface     ICD Specifications Following MD:  Virl Axe, MD     ICD Vendor:  St Jude     ICD Model Number:  [id number]     ICD Serial Number:  [id number] ICD DOI:  01/29/2008     ICD Implanting MD:  Virl Axe, MD  Lead 1:    Location: RA     DOI: 02/19/2005     Model #: [id number]     Serial #: [id number]     Status: active Lead 2:    Location: RV     DOI: 02/19/2005     Model #: [id number]     Serial #: [id number]     Status: active Lead 3:    Location: LV     DOI: 01/29/2008     Model #: [id number]     Serial #: [id number]     Status: active  Indications::  ICM, CHF   ICD Follow Up Remote Check?  No Battery Voltage:  2.90 V     Charge  Time:  11.3 seconds     Battery Est. Longevity:  3.3 YEARS Underlying rhythm:  SR ICD Dependent:  No       ICD Device Measurements Atrium:  Amplitude: 3.7 mV, Impedance: 530 ohms, Threshold: 0.75 V at 0.5 msec Right Ventricle:  Amplitude: 11.4 mV, Impedance: 390 ohms, Threshold: 1.25 V at 0.5 msec Left Ventricle:  Impedance: 740 ohms, Threshold: 1.0 V at 0.8 msec Configuration: BIPOLAR Shock Impedance: 49 ohms   Episodes MS Episodes:  0     Percent Mode Switch:  0     Coumadin:  No Shock:  0     ATP:  0     Nonsustained:  0     Atrial Pacing:  99%     Ventricular Pacing:  99%  Brady Parameters Mode DDDR     Lower Rate Limit:  60     Upper Rate Limit 120 PAV 180     Sensed AV Delay:  130 Rate Response Parameters:  SLOPE >12 FOR LACK OF ENERGY  Tachy Zones VF:  250     VT:  210     VT1:  171     Next Remote Date:  10/25/2010     Next Cardiology Appt Due:  07/15/2011 Tech Comments:  No parameter changes.  Device function normal.  Merlin transmissions every 3 months.  ROV 1 year with Dr. Caryl Comes. Alma Friendly, LPN  October 11, 624THL 11:23 AM   Impression & Recommendations:  Problem # 1:  SYSTOLIC HEART FAILURE, CHRONIC (ICD-428.22)  stable  His updated medication list for this problem includes:    Pacerone 200 Mg Tabs (Amiodarone hcl) .Marland Kitchen... Take 1/2 tablet by mouth once daily    Carvedilol 6.25 Mg Tabs (Carvedilol) .Marland Kitchen... Take 2 by mouth two times a day    Lasix 20 Mg Tabs (Furosemide) .Marland Kitchen... Take one tablet by mouth once daily.    Nitroglycerin 0.4 Mg Subl (Nitroglycerin) .Marland Kitchen... Place 1 tablet under tongue as directed    Lanoxin 0.125 Mg Tabs (Digoxin) .Marland Kitchen... 1/2 tab once daily    Plavix 75 Mg Tabs (Clopidogrel bisulfate) .Marland Kitchen... 1 tab once daily    Diovan 80 Mg Tabs (Valsartan) .Marland Kitchen... Take 1 tablet by mouth once a day    Aspirin Ec 325 Mg Tbec (Aspirin) .Marland Kitchen... Take one tablet by mouth daily  Problem # 2:  ATRIAL FIBRILLATION (ICD-427.31) no recurrent AF,  tests look good..Modest decrease  in DLCO apparently normal for corrected alveolar volume His updated medication list for this problem includes:    Pacerone 200 Mg Tabs (Amiodarone hcl) .Marland Kitchen... Take 1/2 tablet by mouth once daily    Carvedilol 6.25 Mg Tabs (Carvedilol) .Marland Kitchen... Take 2 by mouth two times a day    Lanoxin 0.125 Mg Tabs (Digoxin) .Marland Kitchen... 1/2 tab once daily    Plavix 75 Mg Tabs (Clopidogrel bisulfate) .Marland Kitchen... 1 tab once daily    Aspirin Ec 325 Mg Tbec (Aspirin) .Marland Kitchen... Take one tablet by mouth daily  Problem # 3:  CARDIOMYOPATHY, ISCHEMIC (ICD-414.8) no chest pain   Problem # 4:  ICD - IN SITU (ICD-V45.02) Device parameters and data were reviewed and no changes were made  Patient Instructions: 1)  Your physician recommends that you continue on your current medications as directed. Please refer to the Current Medication list given to you today. 2)  Your physician wants you to follow-up in: 1year  You will receive a reminder letter in the mail two months in advance. If you don't receive a letter, please call our office to schedule the follow-up appointment.

## 2010-11-13 NOTE — Assessment & Plan Note (Signed)
Summary: f49m   Visit Type:  Follow-up Referring Provider:  Bensimhon Primary Provider:  [name]  CC:  no complaints.  History of Present Illness: [name] is delightful 77 year old male with a history of coronary artery disease, status post previous large anterior wall myocardial infarction.  This has been complicated by congestive heart failure.  Ejection fraction of 25%.  He is status post placement of a St. Jude BiV ICD.  He also has had multivessel stenting in the past. Remainder of his medical history is notable for atrial fibrillation,maintaining sinus rhythm on amiodarone.  He is not on Coumadin secondary to GI bleed, chronic renal insufficiency with baseline creatinine about 1.5-2.0, hypertension, hyperlipidemia, and a systemic tremor. He returns today with his wife for routine f/u.   We have been trying to titrate Coreg and also restarted Diovan which was stopped in the past due to near syncope.  Continues to do well. Can walk around [place] 3-4x without problem. Tolerating meds well. No orthopnea, PND or edema. No ICD shocks. Wife feels he is doing much better. mild exertional dyspnea.  Current Medications (verified): 1)  Pacerone 200 Mg Tabs (Amiodarone Hcl) .... Take 1/2 Tablet By Mouth Once Daily 2)  Carvedilol 6.25 Mg Tabs (Carvedilol) .... Take 2 By Mouth Two Times A Day 3)  Lasix 20 Mg Tabs (Furosemide) .... Take One Tablet By Mouth Once Daily. 4)  Nitroglycerin 0.4 Mg Subl (Nitroglycerin) .... Place 1 Tablet Under Tongue As Directed 5)  Potassium Chloride Cr 10 Meq Cr-Tabs (Potassium Chloride) .... Take 1 Tablet By Mouth Once A  Day 6)  Lanoxin 0.125 Mg Tabs (Digoxin) .... 1/2 Tab Once Daily 7)  Crestor 20 Mg Tabs (Rosuvastatin Calcium) .... Take One Tablet By Mouth Daily. 8)  Plavix 75 Mg Tabs (Clopidogrel Bisulfate) .Marland Kitchen.. 1 Tab Once Daily 9)  Protonix 40 Mg Tbec (Pantoprazole Sodium) .... Take 1 By Mouth Once Daily 10)  Zetia 10 Mg Tabs (Ezetimibe) .Marland Kitchen.. 1 Once Daily 11)   Centrum Silver  Tabs (Multiple Vitamins-Minerals) .Marland Kitchen.. 1 Tab Once Daily 12)  Cvs Iron 325 (65 Fe) Mg Tabs (Ferrous Sulfate) .... 3 Times A Week 13)  Diovan 80 Mg Tabs (Valsartan) .... Take 1 Tablet By Mouth Once A Day 14)  Aspirin Ec 325 Mg Tbec (Aspirin) .... Take One Tablet By Mouth Daily 15)  Synthroid 50 Mcg Tabs (Levothyroxine Sodium) .... Take 1 By Mouth Once Daily  Allergies (verified): 1)  ! Amoxicillin 2)  ! Penicillin 3)  Altace  Review of Systems       As per HPI and past medical history; otherwise all systems negative.   Vital Signs:  Patient profile:   77 year old male Height:      71 inches Weight:      183 pounds BMI:     25.62 Pulse rate:   60 / minute BP sitting:   106 / 64  (right arm) Cuff size:   regular  Vitals Entered By: Mignon Pine, RMA (May 17, 2010 11:12 AM)  Physical Exam  General:  well appearing. no resp difficulty HEENT: normal Neck: supple. JVP flatCarotids 2+ bilat; no bruits. Cor: PMI laterally displaced. Regular rate & rhythm. No rubs, gallops, 2/6 SEM murmur at apex Lungs: clear with decreased air movement (mild) Abdomen: soft, nontender, nondistended. No hepatosplenomegaly. No bruits or masses. Good bowel sounds. Extremities: no cyanosis, clubbing, rash, edema Neuro: alert & orientedx3, cranial nerves grossly intact. moves all 4 extremities w/o difficulty. affect pleasant  ICD Specifications Following MD:  Virl Axe, MD     ICD Vendor:  [facility] Jude     ICD Model Number:  [id number]     ICD Serial Number:  [id number] ICD DOI:  01/29/2008     ICD Implanting MD:  Virl Axe, MD  Lead 1:    Location: RA     DOI: 02/19/2005     Model #: [id number]     Serial #: [id number]     Status: active Lead 2:    Location: RV     DOI: 02/19/2005     Model #: [id number]     Serial #: [id number]     Status: active Lead 3:    Location: LV     DOI: 01/29/2008     Model #: [id number]     Serial #: [id number]     Status: active  Indications::  ICM, CHF   ICD Follow  Up ICD Dependent:  No       ICD Device Measurements Configuration: BIPOLAR  Episodes Coumadin:  No  Brady Parameters Mode DDDR     Lower Rate Limit:  60     Upper Rate Limit 120 PAV 180     Sensed AV Delay:  130 Rate Response Parameters:  SLOPE >12 FOR LACK OF ENERGY  Tachy Zones VF:  250     VT:  210     VT1:  171     Impression & Recommendations:  Problem # 1:  CAD, NATIVE VESSEL (ICD-414.01) Stable. No evidence of ischemia. Continue current regimen.  Problem # 2:  SYSTOLIC HEART FAILURE, CHRONIC (ICD-428.22) Doing very well. NYHA Class II. Volume status looks great. Will keep Diovan and Coreg at current dose given previous problems with hypotension and hyperkalemia.  Problem # 3:  ATRIAL FIBRILLATION (ICD-427.31) Maintaining sinus rhythm on amio. Recheck PFTs, TFTs and LFTs.  Other Orders: EKG w/ Interpretation (93000) TLB-BMP (Basic Metabolic Panel-BMET) (99991111) TLB-Hepatic/Liver Function Pnl (80076-HEPATIC) TLB-TSH (Thyroid Stimulating Hormone) (84443-TSH) TLB-T4 (Thyrox), Free [phone]) TLB-T3, Free (Triiodothyronine) (84481-T3FREE) T-2 View CXR (71020TC) Pulmonary Function Test (PFT)  Patient Instructions: 1)  Labs today 2)  A chest x-ray takes a picture of the organs and structures inside the chest, including the heart, lungs, and blood vessels. This test can show several things, including, whether the heart is enlarged; whether fluid is building up in the lungs; and whether pacemaker / defibrillator leads are still in place. 3)  Your physician has recommended that you have a pulmonary function test.  Pulmonary Function Tests are a group of tests that measure how well air moves in and out of your lungs. 4)  Follow up in 4 months

## 2010-11-13 NOTE — Cardiovascular Report (Signed)
Summary: Office Visit   Office Visit   Imported By: Sallee Provencal 07/26/2010 14:43:07  _____________________________________________________________________  External Attachment:    Type:   Image     Comment:   External Document

## 2010-11-13 NOTE — Assessment & Plan Note (Signed)
Summary: 4 month rov/sl   Referring Provider:  Bensimhon Primary Provider:  [name]  CC:  no complaints.  History of Present Illness: [name] is delightful 77 year old male with a history of coronary artery disease, status post previous large anterior wall myocardial infarction.  This has been complicated by congestive heart failure.  Ejection fraction of 25%.  He is status post placement of a St. Jude BiV ICD.  He also has had multivessel stenting in the past. Remainder of his medical history is notable for atrial fibrillation,maintaining sinus rhythm on amiodarone.  He is not on Coumadin secondary to GI bleed, chronic renal insufficiency with baseline creatinine about 1.5-2.0, hypertension, hyperlipidemia, and a systemic tremor. He returns today with his wife for routine f/u.   We have been trying to titrate Coreg and also restarted Diovan which was stopped in the past due to near syncope. In January 2011 we increaqsed Diovan to 80/40 and he could not tolerate.  Continues to do well. Can walk around [facility] 3-4x without  Very active at home. Tolerating meds well. No orthopnea, PND or edema. No ICD shocks. Recent PFTs with mild COPD (mostly small airway) and normal DLCO. Recent labs with Dr. Virgina Jock with Cr 1.6, k 4.7 and digoxin 0.7 (all good)  Current Medications (verified): 1)  Pacerone 200 Mg Tabs (Amiodarone Hcl) .... Take 1/2 Tablet By Mouth Once Daily 2)  Carvedilol 6.25 Mg Tabs (Carvedilol) .... Take 2 By Mouth Two Times A Day 3)  Lasix 20 Mg Tabs (Furosemide) .... Take One Tablet By Mouth Once Daily. 4)  Nitroglycerin 0.4 Mg Subl (Nitroglycerin) .... Place 1 Tablet Under Tongue As Directed 5)  Potassium Chloride Cr 10 Meq Cr-Tabs (Potassium Chloride) .... Take 1 Tablet By Mouth Once A  Day 6)  Lanoxin 0.125 Mg Tabs (Digoxin) .... 1/2 Tab Once Daily 7)  Crestor 20 Mg Tabs (Rosuvastatin Calcium) .... Take One Tablet By Mouth Daily. 8)  Plavix 75 Mg Tabs (Clopidogrel Bisulfate) .Marland Kitchen.. 1  Tab Once Daily 9)  Protonix 40 Mg Tbec (Pantoprazole Sodium) .... Take 1 By Mouth Once Daily 10)  Zetia 10 Mg Tabs (Ezetimibe) .Marland Kitchen.. 1 Once Daily 11)  Centrum Silver  Tabs (Multiple Vitamins-Minerals) .Marland Kitchen.. 1 Tab Once Daily 12)  Cvs Iron 325 (65 Fe) Mg Tabs (Ferrous Sulfate) .... 3 Times A Week 13)  Diovan 80 Mg Tabs (Valsartan) .... Take 1 Tablet By Mouth Once A Day 14)  Aspirin Ec 325 Mg Tbec (Aspirin) .... Take One Tablet By Mouth Daily 15)  Synthroid 50 Mcg Tabs (Levothyroxine Sodium) .... Take 1 By Mouth Once Daily  Allergies: 1)  ! Amoxicillin 2)  ! Penicillin 3)  Altace  Past History:  Past Medical History: Last updated: 09/14/2009 1. CAD    a. s/p anterior MI 12/05 c/b shock -> stent LAD    b. s/p stenting OM-1, 2/06 2. CHF due to ischemic CM    a. EF 20-30%. (Nov 2008)    b. s/p St. Jude BiV-ICD    c. CPX 07/2008  pvo2 16.3 (63% predicted) slope 34 RER 1.08 O2 pulse 93% 3. h/o AF maintaining sinus on amiodarone     --PFTS, TFTs ok 8/10 4. h/o large GIB - refuses coumadin 5. CRI (baseline 2.0-2.2) 6. HTN 7. Hyperlipidemia 8. GERD        Review of Systems       As per HPI and past medical history; otherwise all systems negative.   Vital Signs:  Patient profile:   78  year old male Height:      71 inches Weight:      179 pounds BMI:     25.06 Pulse rate:   60 / minute Resp:     14 per minute BP sitting:   115 / 70  (left arm)  Vitals Entered By: Burnett Kanaris (September 20, 2010 9:23 AM)  Physical Exam  General:  well appearing. no resp difficulty HEENT: normal Neck: supple. JVP flatCarotids 2+ bilat; no bruits. Cor: PMI laterally displaced. Regular rate & rhythm. No rubs, gallops, 2/6 SEM murmur at apex Lungs: clear with decreased air movement (mild) Abdomen: soft, nontender, nondistended. No hepatosplenomegaly. No bruits or masses. Good bowel sounds. Extremities: no cyanosis, clubbing, rash, edema Neuro: alert & orientedx3, cranial nerves grossly  intact. moves all 4 extremities w/o difficulty. affect pleasant device pocket somewhat retracted but skin moves easily acroos the surface     ICD Specifications Following MD:  Virl Axe, MD     ICD Vendor:  [employer] Jude     ICD Model Number:  [phone]     ICD Serial Number:  [id number] ICD DOI:  01/29/2008     ICD Implanting MD:  Virl Axe, MD  Lead 1:    Location: RA     DOI: 02/19/2005     Model #: [id number]     Serial #: [id number]     Status: active Lead 2:    Location: RV     DOI: 02/19/2005     Model #: [id number]     Serial #: [id number]     Status: active Lead 3:    Location: LV     DOI: 01/29/2008     Model #: [id number]     Serial #: [id number]     Status: active  Indications::  ICM, CHF   ICD Follow Up ICD Dependent:  No       ICD Device Measurements Configuration: BIPOLAR  Episodes Coumadin:  No  Brady Parameters Mode DDDR     Lower Rate Limit:  60     Upper Rate Limit 120 PAV 180     Sensed AV Delay:  130 Rate Response Parameters:  SLOPE >12 FOR LACK OF ENERGY  Tachy Zones VF:  250     VT:  210     VT1:  171     Impression & Recommendations:  Problem # 1:  SYSTOLIC HEART FAILURE, CHRONIC (ICD-428.22) Very stable. NYHA II-III. Volume status looks good. Unable to titrate Diovan or carvedialol furhter at this point.   Problem # 2:  CAD, NATIVE VESSEL (ICD-414.01) Stable. No evidence of ischemia. Continue current regimen.  Problem # 3:  ATRIAL FIBRILLATION (ICD-427.31) Maintaining SR on amio. F/u PFTs look good. Continue surveillance. Refuses coumadin due to previous GI bleed.   Other Orders: EKG w/ Interpretation (93000)  Patient Instructions: 1)  Your physician recommends that you schedule a follow-up appointment in: 4 months with Dr. Haroldine Laws 2)  Your physician recommends that you continue on your current medications as directed. Please refer to the Current Medication list given to you today. Prescriptions: DIOVAN 80 MG TABS (VALSARTAN) Take 1 tablet by mouth once a day  #90 x  3   Entered by:   Joelyn Oms RN   Authorized by:   Jolaine Artist, MD, [facility]   Signed by:   Joelyn Oms RN on 09/20/2010   Method used:   Faxed to ...       [facility] (mail-order)             ,  [place]         Ph: [phone]       Fax: [phone]   RxIDDQ:[id number] PLAVIX 75 MG TABS (CLOPIDOGREL BISULFATE) 1 tab once daily  #90 x 3   Entered by:   Joelyn Oms RN   Authorized by:   Jolaine Artist, MD, [facility]   Signed by:   Joelyn Oms RN on 09/20/2010   Method used:   Faxed to ...       [facility] (mail-order)             , [place]         Ph: [phone]       Fax: [phone]   RxID:   [id number] CRESTOR 20 MG TABS (ROSUVASTATIN CALCIUM) Take one tablet by mouth daily.  #90 x 3   Entered by:   Joelyn Oms RN   Authorized by:   Jolaine Artist, MD, [facility]   Signed by:   Joelyn Oms RN on 09/20/2010   Method used:   Faxed to ...       [facility] (mail-order)             , [place]         Ph: [phone]       Fax: [phone]   RxID:   [id number] LASIX 20 MG TABS (FUROSEMIDE) Take one tablet by mouth once daily.  #30 x 9   Entered by:   Joelyn Oms RN   Authorized by:   Jolaine Artist, MD, [facility]   Signed by:   Joelyn Oms RN on 09/20/2010   Method used:   Electronically to        [facility] (retail)       [address]       [place], [place]  [address]       Ph: [phone]       Fax: [phone]   RxID:   [id number] PACERONE 200 MG TABS (AMIODARONE HCL) Take 1/2 tablet by mouth once daily  #90 Each x 1   Entered by:   Joelyn Oms RN   Authorized by:   Jolaine Artist, MD, [facility]   Signed by:   Joelyn Oms RN on 09/20/2010   Method used:   Electronically to        [facility] (retail)       [address]       [place], [place]  [address]       Ph: [phone]       Fax: [phone]   RxID:   [id number] POTASSIUM CHLORIDE CR 10 MEQ CR-TABS (POTASSIUM CHLORIDE) Take 1 tablet by mouth once a   day  #60 x 5   Entered by:   Joelyn Oms RN   Authorized by:   Jolaine Artist, MD, [facility]   Signed by:   Joelyn Oms RN on 09/20/2010   Method used:   Electronically to        [facility] (retail)       [address]       [place], [place]  [address]       Ph: [phone]       Fax: [phone]   RxID:   [id number] NITROGLYCERIN 0.4 MG SUBL (NITROGLYCERIN) Place 1 tablet under tongue as directed  #  25 x 6   Entered by:   Joelyn Oms RN   Authorized by:   Jolaine Artist, MD, [facility]   Signed by:   Joelyn Oms RN on 09/20/2010   Method used:   Electronically to        [facility] (retail)       [address]       [place], [place]  [address]       Ph: [phone]       Fax: [phone]   RxID:   [id number]

## 2010-11-13 NOTE — Letter (Signed)
Summary: Remote Device Check  [facility], [place]  [id number] [place]   [place], [place] [address]   Phone: [phone]  Fax: [phone]     February 09, 2010 MRN: [id number]   [place], [place]  [address]   Dear [name],   Your remote transmission was recieved and reviewed by your physician.  All diagnostics were within normal limits for you.  __X___Your next transmission is scheduled for:  May 10, 2010.  Please transmit at any time this day.  If you have a wireless device your transmission will be sent automatically.     Sincerely,  Hotel manager

## 2010-11-15 NOTE — Letter (Signed)
Summary: [facility] Office Visit Note   [facility] Office Visit Note   Imported By: Sallee Provencal 10/17/2010 16:02:27  _____________________________________________________________________  External Attachment:    Type:   Image     Comment:   External Document

## 2010-11-25 ENCOUNTER — Encounter (INDEPENDENT_AMBULATORY_CARE_PROVIDER_SITE_OTHER): Payer: Self-pay | Admitting: *Deleted

## 2010-11-27 ENCOUNTER — Other Ambulatory Visit: Payer: Self-pay | Admitting: Dermatology

## 2010-12-05 NOTE — Letter (Signed)
Summary: Remote Device Check  [facility], [place]  [id number] [place]   [place], [place] [address]   Phone: [phone]  Fax: [phone]     November 25, 2010 MRN: [id number]   [facility] [address] [place], [place]  [address]   Dear Mr. Elgin,   Your remote transmission was recieved and reviewed by your physician.  All diagnostics were within normal limits for you.  __X___Your next transmission is scheduled for:  01-17-2011 .  Please transmit at any time this day.  If you have a wireless device your transmission will be sent automatically.   Sincerely,  Shelly Bombard

## 2010-12-05 NOTE — Cardiovascular Report (Signed)
Summary: Office Visit Remote   Office Visit Remote   Imported By: Sallee Provencal 11/27/2010 15:17:09  _____________________________________________________________________  External Attachment:    Type:   Image     Comment:   External Document

## 2010-12-06 ENCOUNTER — Encounter: Payer: Self-pay | Admitting: Internal Medicine

## 2010-12-06 ENCOUNTER — Encounter (INDEPENDENT_AMBULATORY_CARE_PROVIDER_SITE_OTHER): Payer: Medicare Other

## 2010-12-06 DIAGNOSIS — I6529 Occlusion and stenosis of unspecified carotid artery: Secondary | ICD-10-CM

## 2010-12-11 NOTE — Miscellaneous (Signed)
Summary: Orders Update  Clinical Lists Changes  Problems: Added new problem of CAROTID ARTERY DISEASE (ICD-433.10) Orders: Added new Test order of Carotid Duplex (Carotid Duplex) - Signed 

## 2011-01-24 ENCOUNTER — Ambulatory Visit (INDEPENDENT_AMBULATORY_CARE_PROVIDER_SITE_OTHER): Payer: Medicare Other | Admitting: *Deleted

## 2011-01-24 ENCOUNTER — Telehealth: Payer: Self-pay | Admitting: Internal Medicine

## 2011-01-24 DIAGNOSIS — I428 Other cardiomyopathies: Secondary | ICD-10-CM

## 2011-01-24 NOTE — Telephone Encounter (Signed)
Option 1 ref# [id number]. [employer] calling pt diovan is not covered. [employer] wants to if he can get another med.

## 2011-01-25 ENCOUNTER — Telehealth: Payer: Self-pay | Admitting: *Deleted

## 2011-01-29 ENCOUNTER — Other Ambulatory Visit: Payer: Self-pay

## 2011-01-30 NOTE — Telephone Encounter (Signed)
Needed prior auth for Diovan or can change pt to enalapril, lisinopril, losartan, avapro or micardis, called [employer] at [id number] (pts ID [id number]) Diovan 80mg  daily was approved, pt's wife approved

## 2011-01-31 NOTE — Progress Notes (Signed)
icd remote

## 2011-02-04 ENCOUNTER — Telehealth: Payer: Self-pay | Admitting: Internal Medicine

## 2011-02-04 DIAGNOSIS — I10 Essential (primary) hypertension: Secondary | ICD-10-CM

## 2011-02-04 MED ORDER — VALSARTAN 80 MG PO TABS
80.0000 mg | ORAL_TABLET | Freq: Every day | ORAL | Status: DC
Start: 2011-02-04 — End: 2011-08-12

## 2011-02-04 NOTE — Telephone Encounter (Signed)
Patient got a phone call that [employer] would not be approved. Spoke with [employer] and there was an error on their part. Diovan 80 mg was approved. Patient is aware.

## 2011-02-07 ENCOUNTER — Encounter: Payer: Self-pay | Admitting: Internal Medicine

## 2011-02-08 ENCOUNTER — Encounter: Payer: Self-pay | Admitting: Internal Medicine

## 2011-02-08 ENCOUNTER — Ambulatory Visit (INDEPENDENT_AMBULATORY_CARE_PROVIDER_SITE_OTHER): Payer: Medicare Other | Admitting: Internal Medicine

## 2011-02-08 VITALS — BP 92/48 | HR 60 | Resp 18 | Ht 71.0 in | Wt 179.8 lb

## 2011-02-08 DIAGNOSIS — I5022 Chronic systolic (congestive) heart failure: Secondary | ICD-10-CM

## 2011-02-08 DIAGNOSIS — I4891 Unspecified atrial fibrillation: Secondary | ICD-10-CM

## 2011-02-08 DIAGNOSIS — I2510 Atherosclerotic heart disease of native coronary artery without angina pectoris: Secondary | ICD-10-CM

## 2011-02-08 DIAGNOSIS — R0989 Other specified symptoms and signs involving the circulatory and respiratory systems: Secondary | ICD-10-CM

## 2011-02-08 LAB — CBC WITH DIFFERENTIAL/PLATELET
Basophils Relative: 0.5 % (ref 0.0–3.0)
Eosinophils Relative: 1.6 % (ref 0.0–5.0)
HCT: 35.8 % — ABNORMAL LOW (ref 39.0–52.0)
Lymphs Abs: 1.9 10*3/uL (ref 0.7–4.0)
MCV: 90.5 fl (ref 78.0–100.0)
Monocytes Absolute: 1 10*3/uL (ref 0.1–1.0)
Monocytes Relative: 13.1 % — ABNORMAL HIGH (ref 3.0–12.0)
Neutrophils Relative %: 60 % (ref 43.0–77.0)
RBC: 3.95 Mil/uL — ABNORMAL LOW (ref 4.22–5.81)
WBC: 7.6 10*3/uL (ref 4.5–10.5)

## 2011-02-08 LAB — BASIC METABOLIC PANEL
Calcium: 8.9 mg/dL (ref 8.4–10.5)
Chloride: 107 mEq/L (ref 96–112)
Creatinine, Ser: 1.5 mg/dL (ref 0.4–1.5)
Sodium: 142 mEq/L (ref 135–145)

## 2011-02-08 LAB — TSH: TSH: 0.02 u[IU]/mL — ABNORMAL LOW (ref 0.35–5.50)

## 2011-02-08 NOTE — Assessment & Plan Note (Signed)
No evidence of ischemia. Continue current regimen.   

## 2011-02-08 NOTE — Assessment & Plan Note (Signed)
Maintaining SR on low dose amio. Recent surveillance was ok. Not coumadin candidate due to h/o GIB. Continue ASA.

## 2011-02-08 NOTE — Progress Notes (Signed)
HPI:  [name] is delightful 77 year old male with a history of coronary artery disease, status post previous large anterior wall myocardial infarction.  This has been complicated by congestive heart failure.  Ejection fraction of 25%.  He is status post placement of a St. Jude BiV ICD.  He also has had multivessel stenting in the past. Remainder of his medical history is notable for atrial fibrillation,maintaining sinus rhythm on amiodarone.  He is not on Coumadin secondary to GI bleed, chronic renal insufficiency with baseline creatinine about 1.5-2.0, hypertension, hyperlipidemia, and a systemic tremor.  He returns today with his wife for routine f/u.   Says he feels OK but just has no energy. Feels it is much worse over past few weeks. No CP. Does get some dyspnea but it is mostly fatigue. No edema, orthopnea or PND. Can walk a few hundred feet and then has to sit down. No dizziness. Having sharp pain in his shoulder and arm (chronic). + indigestion  ROS: All systems negative except as listed in HPI, PMH and Problem List.  Past Medical History  Diagnosis Date  . CAD (coronary artery disease)      a. s/p anterior MI 12/05 c/b shock -> stent LAD   b. s/p stenting OM-1, 2/06  . CHF (congestive heart failure)     due to ischemic CM  a. EF 20-30%. (Nov 2008)   b. s/p St. Jude BiV-ICD    c. CPX 07/2008  pvo2 16.3 (63% predicted) slope 34 RER 1.08 O2 pulse 93%  . Atrial fibrillation or flutter     maintaining sinus on amiodarone  --PFTS, TFTs ok 8/10  . CRI (chronic renal insufficiency)     (baseline 2.0-2.2)  . HTN (hypertension)   . Hyperlipidemia   . GERD (gastroesophageal reflux disease)     Current Outpatient Prescriptions  Medication Sig Dispense Refill  . aspirin 325 MG EC tablet Take 325 mg by mouth daily.        . carvedilol (COREG) 6.25 MG tablet Take 2 tablets by mouth Twice daily.      . CRESTOR 20 MG tablet Take 1 tablet by mouth daily.      . digoxin (LANOXIN) 0.125 MG tablet  1/2 tab once daily      . ferrous sulfate ([facility] IRON) 325 (65 FE) MG tablet 3 times a week       . furosemide (LASIX) 20 MG tablet Take 1 tablet by mouth daily.      Marland Kitchen KLOR-CON M10 10 MEQ tablet Take 1 tablet by mouth daily.      Marland Kitchen levothyroxine (SYNTHROID, LEVOTHROID) 75 MCG tablet Take 1 tablet by mouth daily.      . Multiple Vitamins-Minerals (CENTRUM SILVER PO) Take 1 tablet by mouth daily.        . nitroGLYCERIN (NITROSTAT) 0.4 MG SL tablet Place 0.4 mg under the tongue as directed.        Marland Kitchen PACERONE 200 MG tablet Take 1/2 tablet by mouth once daily      . pantoprazole (PROTONIX) 40 MG tablet Take 1 tablet by mouth daily.      Marland Kitchen PLAVIX 75 MG tablet Take 1 tablet by mouth daily.      . valsartan (DIOVAN) 80 MG tablet Take 1 tablet (80 mg total) by mouth daily.  90 tablet  3  . ZETIA 10 MG tablet Take 1 tablet by mouth daily.         PHYSICAL EXAM: Filed Vitals:   02/08/11 1351  BP: 96/56  Pulse: 60  Resp: 18    General:  Mildly fatigued appearing. no resp difficulty HEENT: normal Neck: supple. JVP flat Carotids 2+ bilat; no bruits. Cor: PMI laterally displaced. Regular rate & rhythm. No rubs, gallops, 2/6 SEM murmur at apex Lungs: clear with decreased air movement (mild) Abdomen: soft, nontender, nondistended. No hepatosplenomegaly. No bruits or masses. Good bowel sounds. Extremities: no cyanosis, clubbing, rash, edema Neuro: alert & orientedx3, cranial nerves grossly intact. moves all 4 extremities w/o difficulty. affect pleasant device pocket somewhat retracted but skin moves easily acroos the surface   ECG: AV pacing 60   ASSESSMENT & PLAN:

## 2011-02-08 NOTE — Patient Instructions (Signed)
Labs today Your physician has recommended that you have an AV optimization echo. During this procedure, an echocardiogram is performed to optimize the timing of your device using ultrasound and a device programmer. Changes will be made to the device settings to help the heart chambers pump more efficiently. This procedure takes approximately one hour.  NEEDS NEXT WEEK WITH DR Caryl Comes Your physician recommends that you schedule a follow-up appointment in: 3 weeks

## 2011-02-08 NOTE — Assessment & Plan Note (Signed)
Fatigued today with NYHA III-IIIB symptoms. Will check bloodwork, Decrease coreg to 6.25 bid and refer him for AV-optimization of his device. If no improvement in 3 weeks will need RHC.

## 2011-02-12 ENCOUNTER — Encounter: Payer: Self-pay | Admitting: *Deleted

## 2011-02-12 ENCOUNTER — Ambulatory Visit (INDEPENDENT_AMBULATORY_CARE_PROVIDER_SITE_OTHER): Payer: Medicare Other | Admitting: *Deleted

## 2011-02-12 ENCOUNTER — Ambulatory Visit (HOSPITAL_COMMUNITY): Payer: Medicare Other | Attending: Internal Medicine | Admitting: Radiology

## 2011-02-12 DIAGNOSIS — I428 Other cardiomyopathies: Secondary | ICD-10-CM

## 2011-02-12 DIAGNOSIS — Z95810 Presence of automatic (implantable) cardiac defibrillator: Secondary | ICD-10-CM

## 2011-02-12 DIAGNOSIS — I5022 Chronic systolic (congestive) heart failure: Secondary | ICD-10-CM | POA: Insufficient documentation

## 2011-02-12 DIAGNOSIS — I059 Rheumatic mitral valve disease, unspecified: Secondary | ICD-10-CM

## 2011-02-14 ENCOUNTER — Other Ambulatory Visit: Payer: Self-pay

## 2011-02-14 NOTE — Progress Notes (Signed)
Av opt

## 2011-02-26 NOTE — Assessment & Plan Note (Signed)
[facility]                         ELECTROPHYSIOLOGY OFFICE NOTE   NAME:[name], [name]                   MRN:          [id number]  DATE:09/24/2007                            DOB:          [birth year]    The patient is tolerating his medication up-titration quite well.  He  continues to be on amiodarone for his paroxysmal atrial fibrillation and  his Coreg and Diovan have been also up-titrated.   PHYSICAL EXAMINATION:  VITAL SIGNS:  Blood pressure today is 109/64,  pulse 63.  LUNGS:  Clear.  HEART:  Sounds were regular.  EXTREMITIES:  Without edema.   Interrogation of his [place] ICD demonstrates a P wave of 3 with  impedance of 450, threshold of 0.75 at 0.5, R wave was 12 with impedance  of 440, threshold of 1 volt at 0.5.  The PMT option was turned off.  He  is in the DDR mode because of underlying chronotropic incompetence.   IMPRESSION:  1. Chronotropic incompetence.  2. Paroxysmal atrial fibrillation.  3. Ischemic heart disease with depressed left ventricular function and      narrow QRS.  4. Status post dual chamber ICD for the above.  5. Amiodarone therapy.   The patient is doing quite well.  He had amiodarone surveillance labs  checked in November.   We will plan to see him again in six months' time.     Deboraha Sprang, MD, [facility]  Electronically Signed    SCK/MedQ  DD: 09/24/2007  DT: 09/25/2007  Job #: [id number]

## 2011-02-26 NOTE — Assessment & Plan Note (Signed)
[facility]                            CARDIOLOGY OFFICE NOTE   NAME:[name], [name]                   MRN:          [id number]  DATE:10/12/2007                            DOB:          11/19/1931    PRIMARY CARDIOLOGIST:  Dr. Glori Bickers.   PRIMARY CARE PHYSICIAN:  Dr. [name].   PATIENT PROFILE:  A 77 year old Caucasian male with prior history of CAD  and ischemic cardiomyopathy who presents following a recent ER visit for  dyspnea.   PROBLEM LIST:  1. Coronary artery disease.      a.     Status post anterior wall MI in 2005 treated with PCI and       stenting of the LAD and second diagonal.      b.     Most recent catheterization in February 2006 with PCI and       stenting to OM1 with CYPHER drug-eluting stent.  LAD and diagonal       stents were patent.      c.     Myoview November 13, 2006 adenosine Myoview EF 23% with large       prior anterior septal and apical infarct, and very mild peri-       infarct ischemia in the anterior wall.  The patient was medically       managed.  2. Ischemic cardiomyopathy/chronic systolic heart failure.      a.     Status post St. Jude ICD.      b.     August 19, 2007 2D echocardiogram EF 20-30% with akinesis       of the entire periapical wall and the mid to distal anterior wall       and entire septal wall.  There was trivial MR, PR, and TR.  3. History of paroxysmal atrial fibrillation.      a.     On amiodarone.      b.     Not on Coumadin secondary to history of GI bleed.  4. Chronic renal insufficiency with baseline creatinine of      approximately 1.7.  5. Hypertension.  6. Hyperlipidemia.   HISTORY OF PRESENT ILLNESS:  A 77 year old Caucasian male with the above  problem list.  He was last seen in the clinic in November of 2008 and  approximately 10 days ago began to have upper respiratory symptoms with  cough and congestion.  On Tuesday, December 23, when he went to lie down  to  bed at night he noted orthopnea as well as dyspnea on exertion, and  presented to the [facility] ED.  There, his chest x-ray showed no edema.  However, his BNP was elevated at 474.  He was advised to double his  Lasix for 2 days, thus increasing it from 20 to 40 mg daily and, with  this regimen, his weight had reduced from 187 down to 184.  He also had  some symptomatic improvement, although he says he is not quite back at  baseline.  In reviewing his records, his weight had been in the 180 to  181 range for the better part of 2 years, and he is 184 today.  He has  not had any chest pain, but continues to become dyspneic with what he  describes as any amount of exertion beyond the ordinary.  He denies any  PND, dizziness, syncope, edema, or early satiety.  He continues to sleep  on 2 pillows at night, whereas he would normally sleep on 1.  He says  the change is because of the cough, which has improved, but persisted.   HOME MEDICATIONS:  1. Amiodarone 20 mg daily.  2. Aspirin 325 mg daily.  3. Lasix 20 mg daily.  4. Plavix 75 mg daily.  5. Lipitor 80 mg daily.  6. Zetia 10 mg daily.  7. Iron 325 mg b.i.d.  8. Nexium 40 mg b.i.d.  9. Potassium 10 mEq daily.  10.Coreg 18.5 mg b.i.d.  11.Centrum Silver multivitamin daily.  12.Digoxin 0.125 mg daily.  13.Diovan 80 mg b.i.d.   PHYSICAL EXAM:  Blood pressure 101/66, heart rate 65, respirations 16.  His weight is 184 pounds.  This is down 3 pounds from when he was seen  in [facility] clinic December 11.  Pleasant white male in no acute distress.  Awake, alert, and oriented  x3.  HEENT:  Normal.  NEURO:  Grossly intact and nonfocal.  NECK:  No bruits or JVD.  LUNGS:  Aeration is regular and unlabored.  Clear to auscultation.  CARDIAC:  Regular S1, S2.  No S3 or S4 murmurs.  ABDOMEN:  Round, semi-firm.  Nontender.  Nondistended.  Bowel sounds  present x4.  EXTREMITIES:  Warm and dry.  Pink.  No cyanosis, clubbing, or edema.  Dorsalis  pedis pulses 1+ and equal bilaterally.   ACCESSORY CLINICAL FINDINGS:  BMET and adenosine Myoview are pending.   ASSESSMENT AND PLAN:  1. Acute on chronic systolic congestive heart failure/ischemic      cardiomyopathy.  [name] presented to [facility] emergency      department last week with symptoms of heart failure with elevated      BNP and his symptoms have resolved some with 2 days' worth of      doubling up his Lasix.  His weight, although down form 187 to 184      is still above his prior baseline was at 180 to 181.  I have      recommended that he double up his Lasix for 2 additional days,      along with doubling his potassium.  We will check a BMET today.  He      denies any dietary noncompliance over the holidays to account for      his symptoms, and given his history of coronary disease, we will go      ahead and schedule him for a Myoview to rule out ischemia as a      cause for his acute heart failure last week.  He did have mild peri-      infarct ischemia a year ago.  Hopefully, we can get that this week      and have him follow up with Dr. Haroldine [name] next week with a repeat      BMET at that time.  2. Coronary artery disease.  See number 1.  We will plan on a Myoview      to rule out ischemia.  Continue his aspirin, Plavix, beta blocker,      and ARB.  3. History of atrial fibrillation.  He is atrial paced by ECG.  He      remains on amiodarone, digoxin, and beta blocker therapy.  He is      not on Coumadin secondary to history of bleeding, but is on      aspirin.  4. Chronic renal insufficiency.  His creatinine in the ER was 1.9 by      point of care.  We will repeat this today in light of increased      diuretic over the past week, and increasing of diuretic today for      the next 2 days.  He should have repeat BMET next week.  5. Disposition.  We will increase his Lasix and potassium for the next      2 days and have him come back for a Myoview within the  week, and      follow up with Dr. Haroldine [name] in 1 week.      [name], ANP  Electronically Signed      [name]. [name] Blalock, MD, [facility]  Electronically Signed   CB/MedQ  DD: 10/12/2007  DT: 10/12/2007  Job #: [id number]

## 2011-02-26 NOTE — Assessment & Plan Note (Signed)
[facility]                         ELECTROPHYSIOLOGY OFFICE NOTE   NAME:[name], [name]                   MRN:          [id number]  DATE:06/28/2008                            DOB:          03-Oct-1932    [name] was seen in follow-up for CRTD implanted for congestive  heart failure.  He continues to complain of fatigue and weakness.  We  have reprogrammed his [name] to quick up couple of months ago without  significant benefit.   His medications are notable today for amiodarone taking 2 mg a day for  atrial fibrillation, Coreg 12.5 b.i.d., Diovan 80 b.i.d., Lanoxin  0.0625, Lasix 20, Lipitor 80, Plavix 75, Nexium, and iron.   On examination, his blood pressure was 78/46 with pulse of 74.  His  blood pressure at home today was 105.  His lungs were clear.  His heart  sounds were regular.  The neck veins were flat.  The extremities had no  edema.   Interrogation of his [name] demonstrated P-wave of 4.1, impedance of 40,  and threshold of 0.7 and 0.5 with an R-wave 11.9, impedance of 360,  threshold of 1.25 and 0.5, LV threshold 1.25 and 0.8 with impedance of  760.  He is 100% AV paced.  Heart rate excursion was pretty good.  There  were no episodes of intercurrent atrial fibrillation.   IMPRESSION:  1. Congestive heart failure - chronic - systolic.  2. Ischemic cardiomyopathy.      a.     Prior myocardial infarction.      b.     Depressed left ventricular function.  3. Paroxysmal atrial fibrillation on amiodarone.  4. Hypertension.  5. Protrusion of lateral aspect of his [name].  I should note at this      point that when I manipulated his shoulder over the [name], the      skin easily moved across the surface of the [name] and the pressure      points are eliminated.   [name], I think he is going to be okay.  He was not at  this point in his surgery, to place the [name] below the muscle.   Because of his hypotension,  I asked him to decrease his Diovan to 40  b.i.d. and to decrease his carvedilol to 26.25 b.i.d..  We will change  his appointment with Dr. Haroldine Laws to be seen in about 3 weeks.   We will see again in April.     Deboraha Sprang, MD, [facility]  Electronically Signed   SCK/MedQ  DD: 06/28/2008  DT: 06/29/2008  Job #: [id number]

## 2011-02-26 NOTE — Assessment & Plan Note (Signed)
[facility]                         ELECTROPHYSIOLOGY OFFICE NOTE   NAME:Klingel, [name]                   MRN:          [id number]  DATE:04/19/2008                            DOB:          1932-05-08    [name] comes in today because of concerns about a rash over his  device pocket.  He underwent device generator replacement about 3-4  months ago.  He has also noted an increasing fatigue and worsening  shortness of breath.  He had initial significant response to CRT that  has waned.   MEDICATIONS:  1. Lasix at 20 mg a day.  2. Amiodarone 200 mg a day.  3. Plavix.  4. Lipitor.  5. Aspirin.  6. Digoxin.   PHYSICAL EXAMINATION:  GENERAL:  He is an elderly Caucasian male  appearing his stated age of 59.  VITAL SIGNS:  His blood pressure was 111/71, his pulse was 72, and his  weight was 181.  LUNGS:  Clear.  NECK:  Neck veins were 7.  HEART:  Sounds were regular.  EXTREMITIES:  He had 1+ to 2+ edema.   Interrogation of his device demonstrated a threshold of 1.25 at 0.5 in  the RV, at 0.8 in the LV, and 0.75 to 0.5 in the RA.  The amplitudes  were 4 in the A , 11 in the V, and impedances were 480/390/740.  Quick  output run and the AV delay was lengthened from 110-130 and the LV was  programmed on earlier at 10 milliseconds.   IMPRESSION:  1. Congestive heart failure - Acute on chronic with intercurrent      worsening.  2. Fluid overload.  3. Ischemic cardiomyopathy with prior large anterior wall myocardial      infarction.  4. Amiodarone therapy for paroxysmal atrial fibrillation.  5. Protrusion of the lateral aspect of the device.   [name] is concerned about his device pocket.  There are couple of  areas of blistering.  There was some tape on there, I suspect that this  is noninfectious.  We will have to keep an eye on it.   There is also protrusion of the lateral superior margin.  I suspect that  this relates to an  anchoring suture that is forcing the flap device to  protrude a little bit.  At this point, it is not bothering the patient.  We have reviewed the potential concerns of erosion.  I should mention  that the skin over it is currently not tethered at all.  As related to  his shortness of breath, his device was reprogrammed and we will  increase his Lasix from 20 daily to 20 b.i.d. for 1 week.  He is to come  in to get blood work next week for [name].  At that time, we will  check his BMET.  We will also check his amiodarone surveillance labs and  the Lanoxin level.   We will plan to see him again in 3 months' time in the [facility].     Deboraha Sprang, MD, [facility]  Electronically Signed    SCK/MedQ  DD:  04/19/2008  DT: 04/20/2008  Job #: [id number]

## 2011-02-26 NOTE — Assessment & Plan Note (Signed)
[facility]                            CARDIOLOGY OFFICE NOTE   NAME:[name], [name]                   MRN:          [id number]  DATE:04/13/2008                            DOB:          05/04/1932    PRIMARY CARE PHYSICIAN:  [name] Reel, MD.   INTERVAL HISTORY:  [name] is a delightful 77 year old male with a  complicated past medical history including coronary artery disease  status post previous large anterior wall myocardial infarction  complicated by EF of approximately 25%.  He is status post placement of  dual-chamber St. Jude ICD.  He does not have a biventricular component.  He is status post multivessel stenting.  He also has a history of  paroxysmal atrial fibrillation on Coumadin secondary to GI bleed,  chronic renal insufficiency, baseline creatinine about 2, hypertension,  hyperlipidemia, and a systemic tremor.  He has been maintained on  amiodarone for his atrial fibrillation.   He returns today for routine followup.  Overall, he is doing okay.  He  continues to work in his yard, but gets short of breath with mild  activity, and this is unchanged.  He has not had any chest pain.  He  denies any orthopnea or PND.   CURRENT MEDICATIONS:  1. Amiodarone 200 mg a day.  2. Aspirin 325 a day.  3. Lasix 20 a day.  4. Plavix 75 a day.  5. Lipitor 80 a day.  6. Zetia 10 a day.  7. Potassium 10 a day.  8. Digoxin 0.125 a day.  9. Diovan 80 b.i.d.  10.Multivitamin.  11.Nexium 40 a day.  12.Iron 325 a day.  13.Coreg 12.5 b.i.d.  14.Ambien 6.25 mg half-tablet at night.   PHYSICAL EXAMINATION:  GENERAL:  He is an elderly male in no acute  distress.  He ambulates in the clinic slowly without any respiratory  difficulty.  VITAL SIGNS:  Blood pressure is 114/67, heart rate of 60.  HEENT:  Normal.  NECK:  Supple.  JVP is about 6 cm in water.  Carotids are 2+ bilaterally  without bruits.  There is no lymphadenopathy or thyromegaly.  CARDIAC:  PMI is laterally displaced.  He is regular with no murmurs,  rubs, or gallops.  He has mild skin irritation over his prominent  defibrillator wires.  There is no evidence of infection or wire  protrusion.  LUNGS:  Clear with mildly decreased breath sounds.  ABDOMEN:  Soft, nontender, nondistended.  No hepatosplenomegaly.  No  bruits.  No masses.  Good bowel sounds.  EXTREMITIES:  Warm with no cyanosis, clubbing, or edema.  No rash.  NEURO:  Alert and oriented x3.  Cranial nerves II-XII are intact.  Moves  all 4 extremities without difficulty.  Affect is pleasant.  There is  diffuse tremor.   EKG shows AV pacing at a rate of 60.   ASSESSMENT:  1. Congestive heart failure secondary to ischemic cardiomyopathy.  He      is stable New York Heart Association class III.  There is no volume      overload.  Titration of his medications has  been limited due to      blood pressure.  Given his significant amount of right ventricular      pacing in the setting of class III heart failure symptoms and a      reduced ejection fraction, we discussed possible upgrade to a BiV      device, but he is still a bit reluctant to do this.  2. Chronic atrial fibrillation.  He is maintaining sinus rhythm on      amiodarone.  Recent PFTs looked okay.  He refuses Coumadin.      Continue aspirin 325.  He does have a tremor.  I am wondering if      this may be related to the amiodarone.  For now, we will continue      the amiodarone.  If he gets worse, we can consider stopping.  3. Coronary artery disease, stable.  No evidence of ongoing ischemia.      Continue current therapy.  4. Hyperlipidemia.  He is due for a lipid check, and we will continue      to follow.  5. Mild skin irritation over defibrillator site.  There does seem to      be any evidence of infection.  I told him he can put a little pad      over it as needed and told him to watch very closely for any signs      of infection or skin  breakdown, which should prompt a call to [place]      immediately.     Shaune Pascal. Bensimhon, MD  Electronically Signed    DRB/MedQ  DD: 04/13/2008  DT: 04/14/2008  Job #: [id number]

## 2011-02-26 NOTE — Assessment & Plan Note (Signed)
[facility]                            CARDIOLOGY OFFICE NOTE   NAME:[name], [name]                   MRN:          [id number]  DATE:07/26/2008                            DOB:          12/20/1931    PRIMARY CARE PHYSICIAN:  [name], MD   INTERVAL HISTORY:  [name] is a delightful 77 year old male with a history  of coronary artery disease status post previous large anterior wall  myocardial infarction.  This has been complicated by congestive heart  failure with an ejection fraction of 25%.  He is status post placement  of St. Jude BiV ICD.  He has also had multivessel stenting in the past.  Remainder of past medical history is notable for paroxysmal atrial  fibrillation not on Coumadin secondary to a GI bleed, chronic renal  insufficiency with baseline creatinine of about 2, hypertension,  hyperlipidemia and significant systemic tremor.  He has been maintained  on amiodarone for his atrial fibrillation.   Last month, he saw Dr. Caryl Comes in the EP office.  His blood pressure was  quite low with systolics in the [id number].  Dr. Caryl Comes cut both his Diovan and  Coreg in half.   He returns today for followup.  He says he feels much better.  He has  more energy.  He says he can walk at least half a mile without having to  stop.  He is able to walk up some steps, but he does get short of breath  at the end.  He has not had any orthopnea or PND.  No lower extremity  edema.  No chest pain.   CURRENT MEDICATIONS:  1. Nexium 40 a day.  2. Iron 325 three times a day.  3. Ambien.  4. Aspirin 325 a day.  5. Ambien 200 mg 5 days a week.  6. Digoxin 125 mcg half tablet a day.  7. Lasix 20 a day.  8. Lipitor 80 a day.  9. Plavix 75 a day.  10.Potassium 10 a day.  11.Zetia 10 a day.  12.Coreg 6.25 b.i.d.  13.Diovan 80 a day.  14.Multivitamin.   PHYSICAL EXAMINATION:  GENERAL:  He is in no acute distress.  Ambulates  around the clinic without any respiratory  difficulty.  VITAL SIGNS:  Blood pressure is initially 96/62, on my manual recheck  114/60, heart rate 60, weight 173 which is stable.  HEENT:  Normal.  NECK:  Supple.  There is no JVD.  Carotids are 2+ bilaterally without  any bruits.  There is no lymphadenopathy or thyromegaly.  CARDIAC:  PMI  is laterally displaced.  He has no murmurs, rubs or gallops.  Defibrillator wires are prominent.  There is no skin breakdown.  LUNGS:  Clear with mildly decreased breath sounds throughout.  ABDOMEN:  Soft, nontender, nondistended.  No hepatosplenomegaly.  No  bruits, no mass.  EXTREMITIES:  Warm with no cyanosis, clubbing, or edema.  NEUROLOGICAL:  Alert and oriented x3.  Cranial nerves II through XII are  intact.  Moves all four extremities without difficulty.  Affect is  pleasant.  Diffuse resting tremor.  EKG shows sinus rhythm with ventricular pacing at a rate of 60.   ASSESSMENT AND PLAN:  1. Congestive heart failure secondary to ischemic cardiomyopathy.  He      is currently New York Heart Association class III.  He is      euvolemic.  His symptoms have markedly improved after cutting back      his ACE inhibitor and beta-blocker, although I am happy that he is      feeling better.  Intolerance of these medications concerns me for      progressive heart failure.  We will proceed with cardiopulmonary      exercise testing to evaluate clearly where he is.  I would like to      titrate these medicines back up if at all possible.  We will wait      for the results of his CPX test.  2. Coronary artery disease.  This is stable.  No evidence of ischemia.  3. Atrial fibrillation, maintaining sinus rhythm on amiodarone.      Continue current therapy.  He does have a tremor, but has not seen      any benefit after decreasing his amiodarone.  He refuses Coumadin      due to previous gastrointestinal bleeding.  Continue aspirin.   DISPOSITION:  We will see him back in a month or two for followup.   I  may bring him back sooner if his CPX test is concerning.     Shaune Pascal. Bensimhon, MD  Electronically Signed    DRB/MedQ  DD: 07/26/2008  DT: 07/27/2008  Job #: [id number]   cc:   [name], MD

## 2011-02-26 NOTE — Assessment & Plan Note (Signed)
[facility]                            CARDIOLOGY OFFICE NOTE   NAME:Gough, DMETRIUS BARNFIELD                   MRN:          [id number]  DATE:08/18/2007                            DOB:          08/11/1932    PRIMARY CARE PHYSICIAN:  Dr. [name].   INTERVAL HISTORY:  Mr. Berganza is a delightful 77 year old male with a  history of coronary artery disease, status post previous anterior wall  myocardial infarction.  He also has a history of congestive heart  failure secondary to ischemic cardiomyopathy with an EF of 20% to 30%.  He is status post placement of a St. Jude ICD.  The remainder of his  history is notable for paroxysmal atrial fibrillation, chronic renal  insufficiency, hypertension, hyperlipidemia and a tremor.  He returns  today for routine followup.   Overall, he is doing fairly well.  He is able to do most of his  activities without too much difficulty.  He does note that he gets short  of breath with mild to moderate exertion.  He has not had problems with  chest pain.  No significant orthopnea or PND.  He is having some  problems with pain down his left arm and neck concerning for possible  cervical pinched nerve.  He has not had any ICD shocks.   CURRENT MEDICATIONS:  1. Amiodarone 200 a day.  2. Aspirin 325.  3. Lasix 20.  4. Plavix 75.  5. Lipitor 80 a day.  6. Zetia 10 a day.  7. Iron 325 b.i.d.  8. Nexium 40 a day.  9. Diovan 40 in the morning and 80 at night.  10.Potassium 10 a day.  11.Coreg 18.75 b.i.d.  12.Digoxin 0.125 mg a day.  13.Neurontin 100 mg a day.  14.Multivitamin.   PHYSICAL EXAMINATION:  GENERAL:  He is in no acute distress, ambulatory  in the clinic without any respiratory difficulty.  VITAL SIGNS:  Blood pressure is 108/64, weight is 183, which is stable.  HEENT:  Normal.  NECK:  Supple.  There is no JVD.  Carotids are 2+ bilaterally without  any bruits.  There is no lymphadenopathy or thyromegaly.  CARDIAC:  PMI is laterally displaced.  He is regular with no murmurs,  rubs, or gallops.  LUNGS:  Clear.  ABDOMEN:  Soft, nontender and non-distended.  No hepatosplenomegaly.  No  bruits.  No masses.  Good bowel sounds.  EXTREMITIES:  Warm with no cyanosis, clubbing or edema.  No rash.  NEUROLOGIC:  He is alert and oriented x3.  Cranial nerves II-XII are  intact.  He moves all 4 extremities without difficulty.  Affect is very  pleasant.  He does have a tremor.   EKG:  Shows AV pacing at a rate of 64.   ASSESSMENT AND PLAN:  1. Coronary artery disease, status post previous anterior myocardial      infarction and no evidence of ischemia.  He is on good medical      therapy.  He will continue this.  2. Congestive heart failure secondary to ischemic cardiomyopathy.      Recent VO2 was  16.3 with a slope of 32.  He is currently [place]      Heart Association class III.  He is euvolemic.  We will titrate his      Diovan up to 80 mg b.i.d. and get a BMET in 1 week to make sure his      renal function and potassium are stable.  3. Paroxysmal atrial fibrillation.  He is maintaining sinus rhythm on      amiodarone.  He is due for surveillance pulmonary function tests      and thyroid panel.  He had a recent liver panel by Dr. Virgina Jock; we      will need to get the results of this.  He is not on Coumadin      secondary to fear of gastrointestinal bleeding.  4. Hyperlipidemia, followed by Dr. Virgina Jock.  Goal LDL is less than 70.   DISPOSITION:  We will see him back in 4 months for routine followup.  I  did discuss with him the possibility of participating in the [facility] and we will contact him further about this.     Shaune Pascal. Bensimhon, MD  Electronically Signed    DRB/MedQ  DD: 08/18/2007  DT: 08/19/2007  Job #: [id number]   cc:   [name], MD

## 2011-02-26 NOTE — Assessment & Plan Note (Signed)
[facility]                            CARDIOLOGY OFFICE NOTE   NAME:[name], [name] BARBERI                   MRN:          [id number]  DATE:10/19/2007                            DOB:          October 21, 1931    PRIMARY CARE PHYSICIAN:  [name].   INTERVAL HISTORY:  [name] is a delightful and complicated [age]-  old male with a history of coronary artery disease status post previous  large anterior wall myocardial infarction complicated by an ischemic  cardiomyopathy with an EF of approximately 25%.  He is status post  placement of his St. Jude ICD.  Remainder of his medical history is also  notable for paroxysmal atrial fibrillation not on Coumadin due to  previous GI bleed, chronic renal insufficiency baseline creatinine about  2, hypertension, hyperlipidemia, and systemic tremor.  He returns today  for continued followup.   On Christmas Eve, he was suffering from a cough.  He went to lie down  and developed some orthopnea.  He was seen in the emergency room and  thought to be mildly volume overloaded with a BNP of 474.  His Lasix was  doubled.  He was then seen back in the cardiology office and was thought  that he still may have some volume on board.  His Lasix was increased  for another 2 days and he underwent Myoview to rule out underlying  ischemia.  This showed an EF of 24% with a large anterior apical infarct  with no significant ischemia.  This was essentially unchanged from  previous.   He returns today and says overall he is doing okay.  He denies any chest  pain or chest pressure.  He has NYHA class III heart failure symptoms.  He denies any orthopnea, though he continues to sleep on 2 pillows for  comfort.  No lower extremity edema.   His wife was worried about some possible recurrent GI bleeding given  that he has had some black stools, but he attributes this to his iron  supplementation.  A recent hemoglobin was 13.   MEDICATIONS:  1. Amiodarone 200 a day.  2. Aspirin 325 a day.  3. Lasix 20 a day.  4. Plavix 75 a day.  5. Lipitor 80 a day.  6. Zetia 10 a day.  7. Iron 325 b.i.d.  8. Nexium 40 a day.  9. Potassium 10 a day.  10.Coreg 18.7 b.i.d.  11.Centrum Silver.  12.Digoxin 0.125 a day.  13.Diovan 80 b.i.d.   PHYSICAL EXAM:  This is an elderly male, somewhat frail-appearing, but  in no acute distress.  He ambulates around the clinic without  respiratory difficulty.  Blood pressure is 88/60 in the left arm and I get 80/40 in the right  arm.  It was hard to hear.  Heart rate is 68, weight 184, which is  stable.  HEENT:  Normal.  NECK:  Supple.  There is no JVD.  Carotids are 2+ bilaterally without  bruits.  There is no lymphadenopathy or thyromegaly.  CARDIAC:  PMI is laterally displaced.  He is regular with no murmurs,  rubs, or gallops.  LUNGS:  Clear with mildly decreased breath sounds at the bases.  ABDOMEN:  Soft, nontender, nondistended.  No hepatosplenomegaly.  No  bruits.  No masses.  Good bowel sounds.  EXTREMITIES:  Warm with no cyanosis, clubbing, or edema.  No rash.  NEURO:  He is alert and oriented x3.  Cranial nerves 2-12 are intact.  Moves all 4 extremities without difficulty.  Affect is very pleasant.  He does have a diffuse tremor.   EKG shows sinus rhythm at a rate of 60.  He has previous anterior  lateral infarct with diffuse nonspecific ST-T wave abnormalities.  QRS  duration is 92 ms.   CPX testing from June of last year shows a VO2 of 16.3 mL/kg per minute  with a slope of 32.   ASSESSMENT AND PLAN:  1. Congestive heart failure secondary to ischemic cardiomyopathy.  He      continues to be New York Heart Association class III.  I am worried      that his symptoms may be slowly getting worse.  However, I am      unsure that his most recent problem was related to heart failure      and may have just been more of a bronchitis.  Never the less, I am      concerned  about his possibly declining functional capacity.  We      discussed at length the options of where to go from here.  He has      already essentially maxed out his medical therapy given his blood      pressure.  We talked about possible dyssynchrony echo for      evaluation for resynchronization therapy as well as possible      implantation of a CardioMEMS sensor.  At this point, he is not      interested in pursuing any of these options and feels that he is      stable at this time, and would like to reevaluate in a few months.  2. Coronary artery disease status post previous infarct.  This is      stable.  Recent Myoview does not reveal any ischemia.  3. Atrial fibrillation.  Maintaining sinus rhythm with amiodarone.  He      is not on Coumadin due to gastrointestinal bleeding.  Continue full      dose aspirin.  4. Dark stools.  Hemoglobin is stable.  I have asked him to follow up      with [name] for possible testing with stool cards.   DISPOSITION:  Will see him back in 2 to 3 months for routine followup.   Total time 50 minutes.     Shaune Pascal. Bensimhon, MD  Electronically Signed    DRB/MedQ  DD: 10/19/2007  DT: 10/19/2007  Job #: [id number]   cc:   [name] Reel, MD

## 2011-02-26 NOTE — Discharge Summary (Signed)
NAME:  [name], [name] NO.:  [id number]   MEDICAL RECORD NO.:  [id number]           PATIENT TYPE:   LOCATION:                                 FACILITY:   PHYSICIAN:  Sueanne Margarita, PA   DATE OF BIRTH:  Jun 30, 1932   DATE OF ADMISSION:  01/29/2008  DATE OF DISCHARGE:  01/30/2008                               DISCHARGE SUMMARY   This patient has allergies to penicillin and Imdur.  Greater than 30  minutes for this dictation and exam.   FINAL DIAGNOSES:  1. Discharge day 1 after explantation of St. Jude Atlas cardioverter-      defibrillator implanted in May 2006.  2. Discharge day 1, status post implant of the St. Jude Promote RF      dual chamber, cardioverter-defibrillator with cardiac      resynchronization lead.   SECONDARY DIAGNOSES:  1. History of anterolateral wall myocardial infarction in December      2005.      a.     Emergent catheterization on September 15, 2004, ejection       fraction 42% with cath.  He already had 100% occlusion, there was       sustained VT during the catheterization.  The patient required       intraaortic balloon pump.  The patient received bare-metal stent       to the left anterior descending and percutaneous transluminal       coronary angioplasty of the second diagonal.  2. Electronic catheterization on November 20, 2004 stent to the first      obtuse marginal.  3. Dual chamber cardioverter-defibrillator implanted on May 2006, [place].  4. Ejection fraction of 20%.  5. The patient's functional status at [place] Class      III, chronic systolic congestive heart failure.  6. Chronotropic incompetence.   PROCEDURE:  January 29, 2008, explant of existing St. Jude [employer]  cardioverter defibrillator, implant of St. Jude, Promote RF CRT-D  system,  [name],  no postop procedure complications.  A basic  metabolic panel would be checked in the morning as well as a 12-lead  electrocardiogram  as well as a chest x-ray.   BRIEF HISTORY:  Mr.  [name] is a 77 year old male.  He had ICD  implanted for primary prevention of sudden cardiac death.  The patient  has a history of ischemic heart disease.  He has prior myocardial  infarction with stenting to the LAD and PTCA of the second diagonal and  stenting also to the first obtuse marginal.   The patient was seen by [name] in the fall of 2008, he seemed to be  doing pretty well, however, the patient in the meantime is complaining  of fatigue, lack of energy, and he has some nocturnal dyspnea and some  peripheral edema.  He might possibly benefit from resynchronization  therapy.  His ejection fraction has declined from the catheterization  study in 2005 from 42% to 20 % now.  He is on maximum medical therapy.   Reconfiguration of  the patient's existing cardioverter defibrillator  would help perhaps with his functional capacity.  We will shorten his AV  delay, but this would result in more ventricular pacing and would  require resynchronization anyway.  Potential benefits and risk of  cardiac resynchronization therapy have been discussed with the patient  and he wishes to proceed.   HOSPITAL COURSE:  The patient presents electively, January 29, 2008,  he  underwent implant of the St. Jude Promote CRT-D system.  There have been  no post procedural complications.  He discharged day 1 with education to  keeps his incision dry for the next 7 days, he is to sponge bathe until  Friday April 12,  he is not drive for 1 week and he is to be careful  with mobility of his left arm as described him after the surgery.   DISCHARGE MEDICATIONS:  1. Nexium 40 mg daily.  2. Ambien 6.25 mg one half tablet at bedtime.  3. Iron 325 mg 3 times weekly.  4. Amiodarone 200 mg daily.  5. Enteric coated aspirin 325 mg daily.  6. Plavix 75 mg daily.  7. Lipitor 80 mg daily at bedtime.  8. Zetia 10 mg daily.  9. Potassium chloride 10 mEq daily.   10.Digoxin 0.125 mg daily.  11.Coreg  18.75 twice daily.  12.Diovan 80 mg daily.   He follows up at the [facility], [address] ICD  clinic, Thursday February 11, 2008 at 9 o'clock.  A basic metabolic panel  would be taken his [name] on Tuesday May 24, 2008 at 9.20 in the  morning.  This also goes to his cardiologist who is [name]  [facility].   LABORATORY DATA:  Labs pertinent to this admission, white cells 5.7,  hemoglobin 12.9, hematocrit 39.1, platelets 228, this was drawn on January 25, 2008.  Protime 12.2, INR 1, sodium 140, potassium 4.3, chloride 106,  bicarbonate 30, glucose 86, BUN is 20, and creatinine 1.6.      Sueanne Margarita, PA     GM/MEDQ  D:  01/29/2008  T:  01/30/2008  Job:  [id number]   cc:   [name] Borg, MD  [name] Sprang, MD, [facility]

## 2011-02-26 NOTE — Discharge Summary (Signed)
NAME:  [name], [name] NO.:  [id number]   MEDICAL RECORD NO.:  [id number]          PATIENT TYPE:  INP   LOCATION:  2041                         FACILITY:  [place]   PHYSICIAN:  Denice Bors. Stanford Breed, MD, FACCDATE OF BIRTH:  January 29, 1932   DATE OF ADMISSION:  01/29/2008  DATE OF DISCHARGE:  01/31/2008                               DISCHARGE SUMMARY   PROCEDURES:  1. Explantation of the previously implanted device.  2. Implantation of a new St. Jude implantable cardioverter      defibrillator with pocket revision.  3. Lead repair, insertion of an LV lead and intraoperative      defibrillation threshold testing.   PRIMARY FINAL DISCHARGE DIAGNOSIS:  Chronic systolic congestive heart  failure.   SECONDARY DIAGNOSES:  1. Presumed ischemic cardiomyopathy.  2. Chronotropic incompetence.  3. Status post anterior myocardial infarction in December 2005 with a      bare metal stents to left anterior descending and percutaneous      transluminal coronary angioplasty to the second diagonal,      complicated by cardiogenic shock and paroxysmal atrial fibrillation  4. Status post percutaneous transluminal coronary angioplasty and drug-      eluting stent to the obtuse margin 1.  5. History of gastrointestinal bleed, Coumadin discontinued.  6. History of hiatal hernia, Barrett's esophagus, diverticulosis, and      gastroesophageal reflux disease as well as peptic ulcer disease.  7. Hyperlipidemia.  8. Allergy or intolerance to amoxicillin.   TIME AT DISCHARGE:  29 minutes.   HOSPITAL COURSE:  [name] is a 76 year old male with known  coronary artery disease.  He was evaluated by Dr. Caryl Comes for a device  upgrade.  [name] agreed to the procedure and he was admitted for  this on January 29, 2008.   [name] underwent a successful CRT upgrade with no significant  difficulty.  A followup chest x-ray showed pacer leads and pacer in  satisfactory position without  pneumothorax.  He had some hypotension and  problems with pain control so he was held over another day.   On January 31, 2008, [name] was a little weak but was able to move  himself around with a little help from his wife and having no chest pain  or shortness of breath.  Dr. Stanford Breed considered him stable for  discharge with close outpatient followup.   DISCHARGE INSTRUCTIONS:  His activity level is to be increased gradually  with no shower for a  weeks.  No driving for a week and increasing his  arm movements as indicated on the discharge sheet.  He is to follow up  with the pacer clinic on February 11, 2008 for a wound was checked and the  device check and then see Dr. Caryl Comes on August 11 at 9:20.  He is to  follow up with Dr. Virgina Jock as needed.   DISCHARGE MEDICATIONS:  1. Nexium 30 mg a day.  2. Ambien 1-1/2 tablet at bedtime.  3. Iron 325 mg 3 times a week.  4..  Amiodarone 2 mg a day.  1. Aspirin 325 mg daily.  2. Plavix 75 mg a day.  3. Lipitor 80 mg at bedtime.  4. Zetia 10 mg a day.  5. Potassium 10 mEq a day.  6. Digoxin 0.125 mg daily.  7. Coreg 12.5 mg b.i.d.  8. Diovan 80 mg a day.      Rosaria Ferries, PA-C      Denice Bors. Stanford Breed, MD, [facility]  Electronically Signed    RB/MEDQ  D:  01/31/2008  T:  02/01/2008  Job:  [id number]   cc:   [name], MD

## 2011-02-26 NOTE — Letter (Signed)
January 25, 2008    [name] [name]   RE:  [name] [name]  MRN:  [id number]  /  DOB:  October 22, 1931   Dear [name] [name]:   It was a pleasure to see [name] [name] at your request to consider  upgrade of his device to CRT.   As you know, he is a gentleman who underwent ICD implantation for  primary intervention for ischemic heart disease with prior myocardial  infarction, prior stenting and ejection fraction of about 20% or so.  Device implant was May 2006.  When I last saw him last fall, he seemed  to be doing pretty well.  Obviously, I did not get a good enough  history, because when you saw him a month or so later, you were quite  impressed at his functional incapacity, fatigue, paucity of energy.  He  has some nocturnal dyspnea and some peripheral edema.   You raised the question at that time as to whether he might benefit from  resynchronization.  As noted, he is status post dual-chamber ICD  implantation.  He is paced in the ventricle about 50-60% of the time.  When he does not pace, he conducts with a moderately long first degree  AV block with PR intervals of about 300 milliseconds, notwithstanding  the electrocardiogram.   MEDICATIONS:  1. Amiodarone 200.  2. Aspirin 325.  3. Lasix 20.  4. Lipitor.  5. Zetia.  6. Coreg 6.25 b.i.d.  7. Nexium.  8. Diovan 80 b.i.d.   PHYSICAL EXAMINATION:  GENERAL APPEARANCE:  He is an elderly Caucasian  male appearing his stated age.  VITAL SIGNS:  His blood pressure is 102/50 with a pulse of 76.  His  weight was 188.  NECK:  His neck veins were 7 cm with positive hepatojugular reflux.  Carotids were brisk.  BACK:  Without kyphosis or scoliosis.  LUNGS:  Clear.  CARDIOVASCULAR:  Heart sounds were regular with an early systolic  murmur.  No S3 was appreciated.  ABDOMEN:  Soft with active bowel sounds without midline pulsation or  hepatomegaly.  EXTREMITIES:  Femoral pulses were 2+.   Distal pulses were intact.  There  was no clubbing, cyanosis or edema.  NEUROLOGIC:  Exam was grossly normal.  SKIN:  Warm and dry.   Electrocardiogram today demonstrated AV pacing.   IMPRESSION:  1. Ischemic cardiomyopathy.      a.     Prior percutaneous coronary intervention.      b.     Prior myocardial infarction.      c.     Ejection fraction 20%.  2. Class III congestive heart failure.  3. Chronotropic incompetence.  4. Status post implantable cardioverter defibrillator - dual-chamber      for the above with 50-60% ventricular pacing and intrinsic first      degree AV block.   DISCUSSION:  [name] [name], has a couple of impairments that may be  contributing to his incapacity which might be remediable by  reconfiguration his device.  The first would be to shorten up his AV  delay.  This, however, would result in much more ventricular pacing and  as such would require resynchronization which might be beneficial anyway  given the fact that he paces 50-60% of the time anytime he gets up to do  anything.   I have reviewed the potential benefits with him as well as the potential  risks including infection and  the lack of response.  Given the above  abnormalities, however, he would like to proceed.   We will plan to go ahead and schedule that at  his convenience.   Thank you for the consultation.    Sincerely,      Deboraha Sprang, MD, [facility]  Electronically Signed    SCK/MedQ  DD: 01/25/2008  DT: 01/25/2008  Job #: [id number]   CC:    [name], MD

## 2011-02-26 NOTE — Assessment & Plan Note (Signed)
[facility]                            CARDIOLOGY OFFICE NOTE   NAME:[name], [name]                   MRN:          [id number]  DATE:12/21/2007                            DOB:          30-Sep-1932    PRIMARY CARE PHYSICIAN:  [name], M.D.   INTERVAL HISTORY:  [name] is a delightful 77 year old male with a  complicated past medical history, including coronary artery disease,  status post previous large anterior wall myocardial infarction,  complicated by ischemic cardiomyopathy, with an EF of approximately 25%.  He is status post placement of a dual-chamber St. Jude ICD.  He does not  have a biventricular component.  The remainder of his medical history is  notable for paroxysmal atrial fibrillation, not on Coumadin secondary to  GI bleed, chronic renal sufficiency, baseline creatinine of about 2,  hypertension, hyperlipidemia, and systemic tremor.   He returns today for routine followup.  Overall, he says he is doing  okay.  He does continue to struggle with some dyspnea and fatigue.  However, he says he is able to walk around [place] for up to an hour with  his wife is long as he goes at a slow pace.  He gets winded quite easily  if he goes faster.  He is able to take a flight of steps at a slow pace.  He denies any chest pain.  No volume overload.  Recent Myoview showed an  EF of 24%, with large anterior apical infarct.  No ischemia.   Interrogation of his device shows maintaining sinus rhythm, with 95%  atrial pacing, about 50% or more ventricular pacing.   CURRENT MEDICATIONS:  1. Amiodarone 200 mg a day.  2. Aspirin 325.  3. Lasix 20.  4. Plavix 75 a day.  5. Lipitor 80 a day.  6. Zetia 10 a day.  7. Potassium 10 a day.  8. Coreg 18.75 b.i.d.  9. Centrum Silver.  10.Digoxin 0.125 mg a day.  11.Diovan 80 b.i.d.  12.Nexium 40 a day.  13.Iron 325.  14.Ambien.   PHYSICAL EXAMINATION:  GENERAL:  He is an elderly male, in no acute  distress.  Ambulates around the clinic slowly, without any respiratory  difficulty.  VITAL SIGNS:  Blood pressure is 92/58, heart rate 65, weight is 186.  HEENT:  Normal.  NECK:  Supple.  There is no JVD.  Carotid are 2+ bilaterally, without  bruits.  There is no lymphadenopathy or thyromegaly.  CARDIAC:  PMI is laterally displaced.  He is regular. with no murmurs,  rubs or gallops.  LUNGS:  Clear. with mildly decreased breath sounds at the bases.  ABDOMEN:  Soft, nontender, nondistended.  No hepatosplenomegaly, no  bruits, no masses.  Good bowel sounds.  EXTREMITIES:  Warm, with no cyanosis, clubbing, or edema.  No rash.  NEUROLOGIC:  Alert and oriented x3.  Cranial nerves II-XII are intact.  Moves all four extremities without difficulty.  Affect is pleasant.  Does have a diffuse tremor.   EKG shows AV pacing at a rate of 65.   ASSESSMENT AND PLAN:  1. Congestive heart failure  secondary to ischemic cardiomyopathy.  He      is stable, NYHA Class III.  There is no volume overload.  He is on      decent medications, but titration has been limited due to his blood      pressure.  Given his significant amount of RV pacing in the setting      of Class III heart failure symptoms and a reduced ejection      fraction, I do think it is reasonable to consider upgrade to a      biventricular device.  I will have an see Dr. Caryl Comes in the near      future to discuss this.  2. Atrial fibrillation.  He is maintaining sinus rhythm on amiodarone.      Recent labs had PFTs looked fine.  3. Coronary artery disease is stable, with no evidence of ongoing      ischemia.  Continue current therapy.  4. Hyperlipidemia.  Most recent LDL was 73, which is right at goal.      HDL is low at 31, but he has refused Niaspan.  LFTs are just      minimally elevated.  We will continue to follow.   DISPOSITION:  He will return clinic in about 2 months for routine  followup.     Shaune Pascal. Bensimhon, MD   Electronically Signed    DRB/MedQ  DD: 12/21/2007  DT: 12/22/2007  Job #: [id number]   cc:   [name] Reel, MD

## 2011-02-26 NOTE — Assessment & Plan Note (Signed)
[facility]                                 ON-CALL NOTE   NAME:Moder, GEARY                     MRN:          [id number]  DATE:10/06/2007                            DOB:          [birth year]    Mr. Limbach's wife called me concerning her husband's shortness of  breath.  She states, over the last 3 to 4 days he has had increasing  shortness of breath with cough and cold symptoms.  This evening, he has  become weaker, just trying to get dressed to go to bed.  She has noticed  a good bit of weakness and shortness of breath with increased coughing.  He denied any chest pain other than soreness because of frequent  coughing.  His cough has been nonproductive at this time.  She denies  any fevers or chills that he has been experiencing, but he has become  weaker and is not wanting to eat over the last couple days and was  concerned to call us and ask for advisement.   The patient has a history of CHF with an EF of 23%.  He also has a St.  Jude ICD pacemaker and a history of atrial fibrillation.  He also has a  history of CAD with a previous anterior wall MI and CHF.   I have advised his wife to call EMS and have them bring him to the  hospital for further evaluation and possible treatment.  This may be  related to pneumonia versus heart failure.  Would like to have him  evaluated further and seen by the ER physician first, and if necessary  call us for cardiology assessment as necessary.  The patient's wife  verbalized understanding and will have him brought to the emergency room  for further evaluation.      Phill Myron. Purcell Nails, NP  Electronically Signed      Champ Mungo. Lovena Le, MD  Electronically Signed   KML/MedQ  DD: 10/06/2007  DT: 10/07/2007  Job #: [id number]

## 2011-02-26 NOTE — Assessment & Plan Note (Signed)
[facility]                            CARDIOLOGY OFFICE NOTE   NAME:[name], [name]                   MRN:          [id number]  DATE:02/24/2007                            DOB:          10-08-1932    PRIMARY CARE PHYSICIAN:  Dr. Precious [name]   INTERVAL HISTORY:  [name] is a very pleasant 77 year old male  with a history of coronary artery disease status post previous anterior  wall myocardial infarction with related congestive heart failure due to  a ischemic cardiomyopathy.  Most recent ejection fraction was 23% by  Myoview.  He is status post single chamber defibrillator.  He also has a  history of paroxysmal atrial fibrillation and is on Amiodarone.  He is  not on Coumadin secondary to previous gastrointestinal bleed.   At his last visit he was complaining of some chest pain.  We did get a  Myoview which showed a ejection fraction of 23%.  There was a large  previous anterior apical infarct with very minimal peri-infarct  ischemia.   He returns today, he says that he is feeling more fatigue.  He can ride  the lawnmower and do some weeding, but after he does this he needs to  take a fairly long break.  He says that his fatigue is about baseline,  however compared to previous visits it seems to me that he is slightly  more run down.  He denies orthopnea, paroxysmal nocturnal dyspnea, no  lower extremity edema, no recent chest pain.   CURRENT MEDICATIONS:  1. Amiodarone 200 mg a day.  2. Aspirin 325 mg.  3. Lasix 20 mg.  4. Plavix 75 mg.  5. Lipitor 80 mg.  6. Zetia 10 mg.  7. Iron 325 mg b.i.d.  8. Nexium 40 mg b.i.d.  9. Diovan 40 mg in the morning and 80 mg at night.  10.Potassium 10 mEq a day.  11.Coreg 18.75 mg b.i.d.  12.Centrum silver multivitamin.   PHYSICAL EXAMINATION:  He is a elderly male who ambulates around the  clinic slowly with no acute distress.  Respirations are unlabored.  Blood pressure 121/64, heart rate  64, weight 181.  HEENT:  Normal.  NECK:  Supple, there is no jugular venous distension, carotids are 2+  bilaterally without bruits, there is no lymphadenopathy or thyromegaly.  CARDIAC:  He has regular rate and rhythm with a soft S3, no obvious  murmur.  LUNGS:  Clear.  ABDOMEN:  Soft, nontender, nondistended, there is no hepatosplenomegaly,  no bruits, no masses, good bowel sounds.  EXTREMITIES:  Warm with no cyanosis, clubbing or edema.  There is no  rash.  NEURO:  He is alert and oriented x3, cranial nerves II-XII are intact,  moves all 4 extremities without difficulty, affect is normal.   EKG shows a atrial paced rhythm with non-specific ST T-wave  abnormalities.  There is previous anterior infarct.   ASSESSMENT/PLAN:  1. Congestive heart failure secondary to ischemic cardiomyopathy.  His      functional status seems to be declining.  Currently NYHA class 3.      I think  it would be appropriate to go ahead and do a      cardiopulmonary exercise test to clearly define his functional      capacity and see if he is a candidate for advanced therapies.  We      could also consider upgrade to biventricular ICD, however secure      restoration is only 100 milliseconds.  For now we will add Digoxin      0.125 mg a day.  I will not titrate his beta blocker or ARB any      further at this point.  He would be a candidate for spironolactone      as well.  We will see him back in 3 weeks to further evaluate with      his VO2 numbers in hand.  2. Hyperlipidemia, LDL is at goal.  3. Coronary artery disease.  This is stable without any events of      obvious ischemia.  4. Mild transamination.  This is being evaluated by [name].  I      think it is likely secondary to his heart failure, plus minus his      statin.  Agree with the plan to recheck in month.     [name] Pascal. Bensimhon, MD     DRB/MedQ  DD: 02/24/2007  DT: 02/24/2007  Job #: [id number]   cc:   [name] Reel, MD

## 2011-02-26 NOTE — Assessment & Plan Note (Signed)
[facility]                            CARDIOLOGY OFFICE NOTE   NAME:[name], [name]                   MRN:          [id number]  DATE:09/27/2008                            DOB:          January 05, 1932    PRIMARY CARE PHYSICIAN:  [name], MD   INTERVAL HISTORY:  [name] is delightful 77 year old male with a history  of coronary artery disease, status post previous large anterior wall  myocardial infarction.  This has been complicated by congestive heart  failure.  Ejection fraction of 25%.  He is status post placement of a  St. Jude BiV ICD.  He also has had multivessel stenting in the past.  Remainder of his medical history is notable for atrial fibrillation,  maintaining sinus rhythm on amiodarone.  He is not on Coumadin secondary  to GI bleed, chronic renal sufficiency with baseline creatinine about 2,  hypertension, hyperlipidemia, and a systemic tremor.   A few months ago, he saw Dr. Caryl Comes for hypotension and his Diovan and  Coreg were cut down.   Overall, he is doing fairly well.  He denies any chest pain.  He does  have some mild shortness of breath, which is chronic.  His main  complaint is pain in his right hip which radiates into his groin.  He  denies any orthopnea.  No PND.  No significant lower extremity edema.  His ICD has not fired.   CURRENT MEDICATIONS:  1. Iron.  2. Ambien.  3. Aspirin 325 a day.  4. Amiodarone 200 mg 5 days a week.  5. Lanoxin 0.625 mg a day.  6. Lasix 20 a day.  7. Lipitor 80 a day.  8. Plavix 75 a day.  9. Potassium 10 a day.  10.Zetia 10 a day.  11.Multivitamin.  12.Coreg 6.25 b.i.d.  13.Diovan 80 a day.  14.Omega-3.  15.Prevacid.   PHYSICAL EXAMINATION:  GENERAL:  He is in no acute distress.  He  ambulates around the clinic without any respiratory difficulty.  VITAL SIGNS:  Blood pressure is 116/70, heart rate is 70, and weight is  173.  HEENT:  Normal.  NECK:  Supple.  No obvious JVD.   Carotids are 2+ bilaterally without any  bruits.  There is no lymphadenopathy or thyromegaly.  CARDIAC:  PMI is laterally displaced.  No murmurs, rubs, or gallops.  LUNGS:  Clear with mildly decreased breath sounds throughout.  No  wheezes or rales.  ABDOMEN:  Soft, nontender, and nondistended.  No hepatosplenomegaly.  No  bruits.  No masses.  EXTREMITIES:  Warm with no cyanosis, clubbing, or edema.  NEUROLOGIC:  Alert and oriented x3.  Cranial nerves II through XII are  intact.  Moves all 4 extremities without difficulty.  Affect is  pleasant.  He has a mild tremor.   Free T4 is 1.0.  Free T3 of 2.5.  TSH is 0.05.   ASSESSMENT AND PLAN:  1. Coronary artery disease, this is stable.  No evidence of ischemia.      Continue current therapy.  2. Congestive heart failure, New York Heart Association class III,  this is stable.  We will attempt to re-titrate up some of his      medications.  We will start with Coreg 9.375.  We will watch his      blood pressure closely.  3. Atrial fibrillation.  He is maintaining sinus rhythm on amiodarone.      We will decrease it to 100 mg 5 days a week.  He continues to      refuse Coumadin.  We will continue aspirin 325 a day.  4. Hip pain.  I told him if this continues, he need to see someone in      Orthopedics.   DISPOSITION:  We will see him back in several months for followup.     Shaune Pascal. Bensimhon, MD  Electronically Signed    DRB/MedQ  DD: 09/27/2008  DT: 09/28/2008  Job #: [id number]   cc:   [name], MD

## 2011-02-26 NOTE — Op Note (Signed)
NAME:  JUSTUS, KLINKO NO.:  [id number]   MEDICAL RECORD NO.:  [id number]          PATIENT TYPE:  INP   LOCATION:  2041                         FACILITY:  [facility]   PHYSICIAN:  Deboraha Sprang, MD, FACCDATE OF BIRTH:  [birth year]   DATE OF PROCEDURE:  01/29/2008  DATE OF DISCHARGE:                               OPERATIVE REPORT   PREOPERATIVE DIAGNOSES:  Congestive heart failure previously implanted  implantable cardioverter-defibrillator, presumed ischemic  cardiomyopathy.   POSTOPERATIVE DIAGNOSES:  Congestive heart failure previously implanted  implantable cardioverter-defibrillator, presumed ischemic  cardiomyopathy.   PROCEDURES:  Explantation of a previously implanted device, implantation  of a new device, pocket revision, lead repair, insertion of an LV lead,  and intraoperative defibrillation threshold testing.   Following obtaining informed consent, the patient was brought to the  electrophysiology laboratory and placed on the fluoroscopic table in the  supine position.  After routine prep and drape of the left upper chest,  lidocaine was infiltrated along line of the previous incision.  Carried  down to the layer of the prepectoral fascia using electrocautery and  sharp dissection.  The device pocket was not opened.  Attention was  turned to gain access to extrathoracic left subclavian vein ,which was  actually a little bit difficult.  Given the vein was okay, but I was not  able to pass a micropuncture wire transversely across the innominate  vein, it would go up into the jugular.  We then repunctured with a  Glidewire and this allowed for passage of a wire through the innominate  vein into the inferior vena cava.  A 5-French sheath was used to replace  this with a Wholey wire over which was then passed a Medtronic MB2  coronary sinus cannulation catheter.  Radial access of the coronary  sinus was obtained.  Contrast venography demonstrated a mid  lateral  branch.  Initial attempts to put a Whisper wire into this vein were  successful.  So, we used a 90-degree attain one angulation sheath, which  facilitated our cannulating this vein and placing a wire.  Over this  wire was passed a St. Jude 1156T, 86-cm lead serial number [id number] to  a midpoint between through the base and apex.  In this location the  bipolar L wave was 25 with a pace impedance of 957 ohms, and threshold  0.8 volts of 0.5 msec.  Current threshold 0.8 mA.  The delivery system  was then removed.  The parameters were confirmed, the lead was secured  to the prepectoral fascia.  At this point, the previously implanted  device pocket was opened.  The device was explanted and the leads were  evaluated.  It was noted that there was heme in the right atrial lead.  Interrogation of this lead demonstrated a P-wave at 3.8 with impedance  of 501ohms, threshold 0.7 volts of 0.5 msec.  Current threshold 1.3 mA.  The RV lead was well-preserved.  The impedance was 422 with an R-wave of  14.8, threshold 0.8 volts of 0.5 msec.  Current threshold 2.6 mA.  With  these acceptable parameters recorded, the  leads were then attached to a  Promote RF ICD model 3207, serial number [id number].  Through the device,  the bipolar P-wave was 3.5 with a pace impedance of 490, threshold 0.75  at 0.5.  The R-wave was 9.2 with a pace impedance of 400, threshold 1  volt of 0.5 and the LV impedance of 790 with a threshold 1 volt of 0.5.  High-voltage impedance was 44 ohms.  At this point, the pocket was  revised to allow for caudal migration given the larger header.  I should  note that prior to insertion of the lead into the insertion of the leads  into the device medical adhesive was deployed across the proximal 3  inches of the atrial lead down to where the insulation.  Least  discolored suggesting heme had moved distal to proximal via the tip of  the lead.  The leads in the pulse generator were then  placed in the  pocket secured to the prepectoral fascia.  I did use Surgicel on the  caudal aspect of the pocket as well as the posterior aspect of the  pocket and defibrillation threshold testing was then undertaken.  Ventricular fibrillation was induced via T-wave shock.  After a total  duration of 7 seconds, a 15 joules shock was delivered, failing to  terminate ventricular fibrillation.  After a total duration of 16.5  seconds, 25 joules shock was delivered through measured resistance of 44  ohms, terminating ventricular fibrillation and restoring paced rhythm.   After wait of 5-6 minutes, ventricular fibrillation was reinduced via T-  wave shock.  After duration of 10 seconds, a 25 joules shock was  delivered through a measured resistance of 42 ohms, terminating  ventricular fibrillation and restoring sinus rhythm.  At this point, the  device was implanted.  The pocket was copiously irrigated with  antibiotic containing saline solution.  This having been done actually  prior to insertion of Surgicel, the wound was then closed in 3 layers in  a normal fashion using Monocryl and layered 1 and 2 and a Vicryl on the  closing layer.  The patient tolerated the procedure well without  apparent complications.      Deboraha Sprang, MD, [facility]  Electronically Signed     SCK/MEDQ  D:  01/29/2008  T:  01/30/2008  Job:  [id number]

## 2011-02-26 NOTE — Assessment & Plan Note (Signed)
[facility]                            CARDIOLOGY OFFICE NOTE   NAME:Bocock, ANIR ROSENER                   MRN:          [id number]  DATE:03/23/2007                            DOB:          [birth year]    PRIMARY CARE PHYSICIAN:  Dr. [name].   INTERVAL HISTORY:  [name] is a delightful 77 year old male with a  history of coronary artery disease, status post previous anterior wall  myocardial infarction.  Also has a history of congestive heart failure  secondary to ischemic cardiomyopathy with an EF of 20%-30%.  He is  status post single chamber St. Jude ICD.  The remainder of his history  is notable for paroxysmal atrial fibrillation, chronic renal  insufficiency, hypertension, and hyperlipidemia.  He returns today for  routine followup.  He did undergo cardiopulmonary exercise test last  week.  Peak VO2 was 16.3 mg/kg per minute with a VE/VCO2 slope of 32.  States his energy continues to be low.  He denies any chest pain or  shortness of breath.  He says that, however, he is able to walk about a  mile at a time before stopping as long as he goes at a slow pace.  He is  unable to keep up with his wife.  He has not had any ICD shocks.   CURRENT MEDICATIONS:  1. Amiodarone 200 a day.  2. Aspirin 325 a day.  3. Lasix 20 a day.  4. Plavix 75 a day.  5. Lipitor 80 a day.  6. Zetia 10 mg a day.  7. Iron 325 b.i.d.  8. Nexium 40 b.i.d.  9. Diovan 40 in the morning, 80 at night.  10.Potassium 10 mEq a day.  11.Coreg 18.75 b.i.d.  12.Centrum Silver.  13.Digoxin 0.125 a day.   PHYSICAL EXAMINATION:  He is an elderly male in no acute distress.  Ambulates around the clinic without difficulty.  Respirations are unlabored.  Blood pressure is 101/60 with a heart rate  of 68, weight is 181 which is stable.  HEENT:  Normal.  NECK:  Supple, no JVD.  Carotids are 2+ bilaterally without any bruits.  There is no lymphadenopathy or thyromegaly.  CARDIAC:  Distant heart sounds, regular rate and rhythm, no obvious rub  or gallop, no murmur.  PMI is laterally displaced.  LUNGS:  Clear.  ABDOMEN:  Soft, nontender, nondistended.  There is no  hepatosplenomegaly, no bruits, no masses, good bowel sounds.  EXTREMITIES:  Warm with no cyanosis, clubbing, or edema.  No rash.  NEURO:  He is alert and oriented x3.  Cranial nerves II-XII are intact.  Moves all 4 extremities without difficulty.  Affect is normal.   ASSESSMENT AND PLAN:  1. Congestive heart failure secondary to ischemic cardiomyopathy.  His      VO2 is somewhat better than I expected.  He continues to be NYHA      class III.  He is euvolemic currently and on a good medical      regimen.  We will continue his current medicines at this time.  Can      consider titrating  his Diovan up a bit in the near future.  2. Coronary artery disease.  This is stable, no evidence of ischemia.      Continue current medication.  3. Paroxysmal atrial fibrillation.  Maintaining sinus rhythm on      amiodarone.  His PFTs and thyroid functions were normal in January.      Will continue routine surveillance at the next visit.  He is not on      Coumadin due to a fear of gastrointestinal bleeding.  4. Hyperlipidemia.  This is at goal.   DISPOSITION:  We will see him back in clinic in 4-5 months.  At this  point, I think his functional capacity is too good to consider advanced  therapies such as a left ventricular assist device.  We have, in the  past, considered possible dyssynchrony echo to see if he might benefit  from a biventricular upgrade to his ICD.  We will discuss this with him  at his next visit.     Shaune Pascal. Bensimhon, MD  Electronically Signed    DRB/MedQ  DD: 03/23/2007  DT: 03/23/2007  Job #: [id number]   cc:   [name], MD

## 2011-03-01 NOTE — Op Note (Signed)
. [facility]  Patient:    [name], [name] Visit Number: [id number] MRN: [id number]          Service Type: END Location: ENDO Attending Physician:  Ernie Avena Dictated by:   Cleotis Nipper, M.D. Proc. Date: 11/27/01 Admit Date:  11/27/2001   CC:         Bobby Rumpf D. Alroy Dust, M.D.   Operative Report  PROCEDURE:  Upper endoscopy.  INDICATIONS:  This is a 77 year old with longstanding reflux symptoms and recent progressive dysphagia.  FINDINGS:   No evident ring or stricture.  Minimal exudative distal esophagitis above small hiatal hernia.  DESCRIPTION OF PROCEDURE:  The nature, purpose and risks of this procedure and possible esophageal dilatation were discussed with the patient who provide written consent.  Sedation was fentanyl 85 mcg and Versed 8.5 mg IV.  Prior to and during the course of this procedure and the colonoscopy which followed it, without significant arrhythmias or desaturation during the course of either procedure.  The Olympus small-caliber adult video endoscope was passed under direct vision.  The vocal cords looked normal and the larynx was also unremarkable.  The esophagus was entered.  The esophageal mucosa was normal.  There was a little bit of exudate right at the gastroesophageal junction without any evident mass effect or ulceration.  This was not felt to be clinically significant.  No overt ring or stricture could be identified despite the fact that a barium tablet had hung up at the gastroesophageal junction on radiographic testing just a month or two ago.  A 2-3 cm hiatal hernia was present.  The stomach contained no significant residual and he had normal mucosa without evidence of gastritis, erosions, ulcers, polyps or masses.  Retroflexed view of the cardia of the stomach was unremarkable as was the appearance of the pylorus, duodenal bulb and second duodenum.  Because the patient had had  virtually complete resolution of his dysphagia symptoms as an outpatient after being started on PPI therapy (Nexium 40 mg once daily), I elected not to perform an esophageal dilatation at this time. The scope was, therefore, removed from the patient who tolerated the procedure well and without apparent complications.  IMPRESSION: 1. No evident ring or stricture to account for previous dysphagia symptoms    which have subsequently resolved on proton pump inhibitors therapy. 2. A small hiatal hernia. 3. Focal exudate at gastroesophageal junction, possibly some residual mild    esophagitis. 4. Dilatation not performed in view of absence of endoscopic stricturing or    significant ring formation and in view of the resolution of his dysphagia    symptoms.  PLAN: 1. Continue Nexium. 2. Consider follow-up endoscopy in approximately six months to make    sure that the residual esophageal has resolved and that there is no    evidence of any underlying neoplastic process although this would seem    to be highly unlikely based on todays appearance. Dictated by:   Cleotis Nipper, M.D. Attending Physician:  Ernie Avena DD:  11/27/01 TD:  11/28/01 Job: [id number]

## 2011-03-01 NOTE — Discharge Summary (Signed)
NAME:  [name], [name] NO.:  [id number]   MEDICAL RECORD NO.:  [id number]          PATIENT TYPE:  INP   LOCATION:  2022                         FACILITY:  [place]   PHYSICIAN:  Glori Bickers, M.D. [facility] OF BIRTH:  1932-06-25   DATE OF ADMISSION:  05/05/2005  DATE OF DISCHARGE:  05/07/2005                           DISCHARGE SUMMARY - REFERRING   HISTORY:  [name] is a 77 year old white male who presented with chest  discomfort that began on the evening prior to his admission.  Describes it  as a squeezing sensation that would like three to five minutes without  associated symptoms and did not resemble his previous angina.  He continued  to have episodes throughout the evening with spontaneous resolution, rated 7  on a scale of 0-10.  His symptoms were also noted to be pleuritic. On the  morning of admission, he was with an increased intensity of similar  symptoms.  He took two nitroglycerin with some relief.  However, his  symptoms recurred and have been constant since he awakened.  After his wife  came home from church, she brought him to the emergency room around noon.  In the emergency room he received morphine and nitroglycerin with minimal  relief.   PAST MEDICAL HISTORY:  1.  Known coronary artery disease with an anterior myocardial infarction in      December 2005.  Last cardiac catheterization was in February 2006 at      which time he received a Cypher stent to the OM-1.  He also has a known      EF of 20-30% with history of heart failure, status post St. Jude ICD in      May 2006, history of paroxysmal atrial fibrillation treated with      anticoagulation with Coumadin.  However, this was discontinued secondary      to a GI bleed.  Most recent EGD/colonoscopy approximately six weeks ago.      The patient was told he did not have any active bleeding.  2.  History of hiatal hernia.  3.  Diverticulosis.  4.  GERD.  5.  Peptic ulcer disease.  6.   Hyperlipidemia.   LABORATORY DATA:  Chest x-ray on admission showed stable cardiomegaly.  A VQ  scan showed high probability of pulmonary embolic disease.  A CT scan showed  a pericardial effusion, cardiomegaly, atherosclerotic calcification of the  aorta.  No evidence of pulmonary embolism.  EKG showed normal sinus rhythm,  atrial pacing, right axis deviation, significantly delayed R waves,  nonspecific ST-T changes.  On admission weight was 175.5.  Hemoglobin 13.9,  hematocrit 41. Normal indices.  Platelets 323. WBC 9.6.  Prior to discharge  hemoglobin 10.4 and hematocrit 30.7.  Normal indices, platelets 248, WBC  7.3.  On admission, PT 13.3, INR 1, PTT 28.  D-dimer was elevated at 1.05.  On admission, sodium 139, potassium 4.4, BUN 20, creatinine 1.5, glucose  116, normal LFTs.  Prior to discharge, sodium was 141, potassium 3.9, BUN  22, creatinine 1.7, normal LFTs.  Amylase and lipase on admission 182 and  188.  Prior  to discharge 76 and 4.  CK-MB and troponins were negative x2.  BNP was elevated at 508.  TSH 2.88.   HOSPITAL COURSE:  [name] was admitted to the unit 2000.  He was  continued on his home medications and aspirin was reduced to 81 mg and he  was started on Naprosyn.  Overnight he continued to have exertional chest  discomfort.  However, enzymes and EKGs ruled out myocardial infarction.  Dr.  Haroldine Laws felt that it was musculoskeletal but with elevated D-dimer, a VQ  was performed.  VQ was abnormal.  It showed a high probability of pulmonary  embolism.  Dr. Haroldine Laws, given his risk of further bleeding with Coumadin,  ordered a spiral CT scan which did not show any evidence of pulmonary  embolism.  He was premedicated with Mucomyst and gently hydrated.  Dr.  Haroldine Laws also started heparin and Coumadin, given the probability of  pulmonary embolism.  With negative results of CT scan, Dr. Haroldine Laws felt  that he could be discharged home.  Prior to discharge after the  CT scan, his  creatinine was 1.7 and amylase and lipase had returned to normal.   DISCHARGE DIAGNOSES:  1.  Chest discomfort of uncertain etiology.  2.  Chronic renal insufficiency.  3.  History of ischemic cardiomyopathy.  4.  Elevated amylase and lipase.  However, on review of CT scan, Dr.      Haroldine Laws did not find any evidence of pancreatitis history as      previously.   DISPOSITION:  The patient is discharged home.   DISCHARGE MEDICATIONS:  1.  Amiodarone 200 mg p.o. daily.  2.  Aspirin 325 mg p.o. daily.  3.  Coreg 3.125 mg b.i.d.  4.  Cozaar 50 mg daily.  5.  Lasix 20 mg daily.  6.  Nexium 40 mg daily.  7.  Plavix 75 mg daily.  8.  K-Dur 20 mEq daily.  9.  Lipitor 80 mg at night.  10. Zetia 2 mg daily.  11. Iron daily.  12. Nitroglycerin 0.4 mg p.r.n.   DIET:  He was asked to maintain a low-salt, low-fat, low-cholesterol diet.   FOLLOW UP:  He will have some blood work for his BMET when he is seen by Dr.  Haroldine Laws on August 1 at 3 p.m.  (please note this is the only appointment  available).  He also has a scheduled appointment with the pacer clinic on  August 14 at 11:30 a.m. and a followup with Dr. __________  on August 21 at  9:45.       EW/MEDQ  D:  05/07/2005  T:  05/07/2005  Job:  [id number]   cc:   L. Donnie Coffin, M.D.  [address]  [place] [address]  Fax: [phone]   Ronald Lobo, M.D.  [address]  [place], [place] [address]  Fax: [phone]

## 2011-03-01 NOTE — Cardiovascular Report (Signed)
NAME:  [name], [name]            ACCOUNT NO.:  [id number]   MEDICAL RECORD NO.:  [id number]          PATIENT TYPE:  OIB   LOCATION:  6524                         FACILITY:  [place]   PHYSICIAN:  Junious Silk, M.D. LHCDATE OF BIRTH:  Feb 02, 1932   DATE OF PROCEDURE:  11/20/2004  DATE OF DISCHARGE:                              CARDIAC CATHETERIZATION   PROCEDURE PERFORMED:  1.  Selective coronary angiography of the left coronaries.  2.  Percutaneous transluminal coronary angiography with placement of a drug-      eluting stent in the first obtuse marginal branch.   INDICATIONS:  Mr. Ohr is a 77 year old male who experienced an acute  anterolateral wall myocardial infarction approximately 2 months ago. He was  treated with emergent PTCA and placement of the stent the proximal left  anterior descending artery as well as a PTCA of the diagonal branch.  At  that time he was found to have an 80% stenosis in the first obtuse marginal  branch.  The patient has been given time to recover from this myocardial  infarction.  One month ago, he experienced an acute GI bleed secondary to  diffuse gastritis. This occurred while on Coumadin, aspirin and ticlopidine.  The Coumadin was discontinued.  The patient has been stabilized since and  has had no evidence of recurrent bleeding.  He has, however, had episodes of  substernal chest pain occasionally on exertion as well as at rest.  We  therefore brought back today for a staged percutaneous coronary intervention  of the obtuse marginal branch.   PROCEDURE NOTE:  A 6-French sheath was placed in the right femoral artery.  Angiomax was administered per protocol.  We used a 6-French CLS 3.5 guiding  catheter.  We initially performed angiography of the  left anterior  descending artery. This revealed the stent in proximal LAD to be widely  patent with proximal 20% stenosis within the mid portion of stent.  The  diagonal branch was also widely  patent.  The mid to distal LAD showed mild  luminal irregularities.  Angiography of the left circumflex revealed a  complex 80% stenosis in the proximal portion of the first obtuse marginal  branch.  An Asahi soft coronary guidewire was advanced under fluoroscopic  guidance into the distal aspect of the obtuse marginal branch.  We then  performed PTCA with a 2.5 x 12 mm Quantum balloon inflated to 10  atmospheres.  Following this, we deployed a 2.5 x 13 mm Cypher stent at a  deployment pressure of 11 atmospheres.  We then went back with our 2.5 x 12  mm Quantum balloon and inflated this to 15 atmospheres within the stent.  Intermittent doses of intracoronary nitroglycerin were administered.  Final  angiographic images were obtained showing patency of the obtuse marginal  with 0% residual stenosis at the stent site and TIMI III flow.   At the conclusion of procedure, an AngioSeal vascular closure device was  placed in the right femoral artery with good hemostasis.   COMPLICATIONS:  None.   RESULTS:  Successful PTCA placement of a drug-eluting stent in the  first  obtuse marginal branch.  An 80% stenosis was reduced to 0% residual with  TIMI III flow.   PLAN:  Angiomax will be discontinued.  The patient has a questionable Plavix  allergy.  He will therefore be continued on combination of aspirin and  ticlopidine for a minimum of 6 months.  Because of a mild elevation in  creatinine, we did not perform a left ventriculogram today.  We will  schedule an echocardiogram in the office to reassess his left ventricular  function.      MWP/MEDQ  D:  11/20/2004  T:  11/20/2004  Job:  [id number]   cc:   L. Donnie Coffin, M.D.  [address]  [place] [address]  Fax: [phone]

## 2011-03-01 NOTE — H&P (Signed)
NAME:  [name], [name]            ACCOUNT NO.:  [id number]   MEDICAL RECORD NO.:  [id number]          PATIENT TYPE:  INP   LOCATION:  4740                         FACILITY:  [place]   PHYSICIAN:  Kirk Ruths, M.D. LHCDATE OF BIRTH:  1932/07/03   DATE OF ADMISSION:  10/10/2004  DATE OF DISCHARGE:                                HISTORY & PHYSICAL   ADDENDUM:   IMPRESSION:  1.  Anticoagulation: Because of his recent stent and myocardial infarction      will continue the aspirin 81 mg daily and Ticlid at this time. His      Coumadin will be reversed with vitamin K. His hemoglobin continues to      decrease despite discontinuing the Coumadin. Will hold Ticlid as well.  2.  Hypertension: Will continue the ACE inhibitor for now with parameters.      Decrease the amiodarone to 200 mg a day and add Coreg in the next 24 to      48 hours if blood pressure allows.  3.  History of AV fistula: Dr. Scot Dock evaluated him for this and has made      him a follow-up appointment.  4.  Bilateral carotid bruits: Carotid Dopplers can be obtained as an      outpatient.  5.  Residual 80% OM: Follow up with Dr. Vicenta Aly for possible staged      percutaneous intervention of the OM once the GI bleed has resolved.   Dr. Kirk Ruths saw the patient and determined the plan of care.       RB/MEDQ  D:  10/10/2004  T:  10/10/2004  Job:  [id number]

## 2011-03-01 NOTE — Procedures (Signed)
[place]  Patient:    NESBIT, MURRA Visit Number: [id number] MRN: [id number]          Service Type: END Location: ENDO Attending Physician:  Ernie Avena Dictated by:   Cleotis Nipper, M.D. Proc. Date: 11/27/01 Admit Date:  11/27/2001   CC:         Bobby Rumpf D. Alroy Dust, M.D.   Procedure Report  PROCEDURE:  Colonoscopy.  INDICATION:  Screening for colon cancer in an asymptomatic 77 year old gentleman without any risk factors such as family history.  FINDINGS:  Minimal left-sided diverticulosis.  DESCRIPTION OF PROCEDURE:  The nature, purpose, and risks of the procedure had been discussed with the patient, who provided written consent.  Digital exam of the prostate was unremarkable.  The Olympus adult video colonoscope was advanced with slight difficulty through a somewhat angulated and fixated sigmoid region and then quite easily around the colon to the level of the ileocecal valve.  Due to his anatomy, despite placing the patient in the supine position and applying some external abdominal compression, we were never able to reach the base of the cecum, but I was able to look down into the cecum quite readily and I believe that virtually the entire cecal surface area was visualized and that no significant lesions would have been missed. Pullback was then performed.  The quality of the prep was excellent, and it is felt that all areas were well-seen.  There were a few left-sided diverticula, but this was otherwise a normal exam, without evidence of polyps, cancer, colitis, or vascular malformations. Retroflexion in the rectum was normal.  No biopsies were obtained.  The patient tolerated the procedure well, and there were no apparent complications.  IMPRESSION:  Minimal diverticulosis, otherwise normal exam.  PLAN:  Flexible sigmoidoscopy for continued screening in five years. Dictated by:   Cleotis Nipper,  M.D. Attending Physician:  Ernie Avena DD:  11/27/01 TD:  11/28/01 Job: [id number]

## 2011-03-01 NOTE — Discharge Summary (Signed)
NAME:  KARAPET, MARTELLI            ACCOUNT NO.:  [id number]   MEDICAL RECORD NO.:  [id number]          PATIENT TYPE:  INP   LOCATION:  2022                         FACILITY:  [place]   PHYSICIAN:  Jacqulyn Ducking, M.D.  DATE OF BIRTH:  02-13-1932   DATE OF ADMISSION:  05/05/2005  DATE OF DISCHARGE:  05/07/2005                                 DISCHARGE SUMMARY   ADDENDUM:  The patient was admitted May 05, 2005, with chest pain.  This  was thought to be musculoskeletal.  He was ruled out for myocardial  infarction with serial cardiac enzymes.  CT of the chest ruled out pulmonary  embolism.  The patient is on aspirin and Plavix.  As seen by Dr. Lattie Haw on  admission and noted in his assessment history and physical, this patient  will need an outpatient Cardiolite study.  Patient says he walks on a  treadmill, rides a bicycle.  He weighs 149 pounds I think he said, and  probably could do an exercise Cardiolite.  Address this at the May 14, 2005 meeting with Dr. Haroldine Laws.       GM/MEDQ  D:  05/07/2005  T:  05/07/2005  Job:  [id number]   cc:   Glori Bickers, M.D. [facility]

## 2011-03-01 NOTE — Discharge Summary (Signed)
NAME:  [name], [name]            ACCOUNT NO.:  [id number]   MEDICAL RECORD NO.:  [id number]          PATIENT TYPE:  INP   LOCATION:  [id number]                         FACILITY:  [place]   PHYSICIAN:  Deboraha Sprang, M.D.  DATE OF BIRTH:  Sep 24, 1932   DATE OF ADMISSION:  02/19/2005  DATE OF DISCHARGE:  02/20/2005                                 DISCHARGE SUMMARY   DISCHARGE DIAGNOSES:  1.  Discharging day #1 status post implantation of St. Jude V243      cardioverter defibrillator.  2.  History of chest tightness and pain post procedure with troponin I      studies of 0.1 and eight hours later 0.08.  Pain abated by morning.  Has      been helped with both oral analgesics such as Percocet, morphine, as      well as one nitroglycerin and most effectively for this patient,      administration of his proton pump inhibitor of Protonix 40 mg.  Pain      probably of esophageal reflux spasm.  3.  History of anterior myocardial infarction with concurrent cardiogenic      shock/atrial fibrillation in December 2005, status post stent to the      proximal left anterior descending.  4.  Ischemic cardiomyopathy, ejection fraction 25 to 30%.  5.  Class II congestive heart failure.  6.  Subsequent stent to the first obtuse marginal November 20, 2004.   SECONDARY DIAGNOSES:  1.  Gastrointestinal bleed on Coumadin for his atrial fibrillation which was      discontinued.  2.  Atrial fibrillation currently sinus rhythm on amiodarone.  3.  Negative colonoscopy January 07, 2005.  4.  Esophagogastroduodenoscopy, possible short segment of Barrett's      esophagus, hiatal hernia.  No arteriovenous malformations or      ulcerations.  5.  Moderate diverticulosis.  6.  Gastroesophageal reflux disease.   PROCEDURE:  Feb 19, 2005, implantation of St. Jude Atlas + DR cardioverter  defibrillator, Dr. Deboraha Sprang, practitioner.  This is a  DDD/cardioverter defibrillator with defibrillator threshold study less  than  or equal to 15 joules.  The patient tolerated the procedure well without  complications.   DISPOSITION:  Mr. Korson discharging post procedure day #1.  In the  immediate post procedure period he was complaining of chest pain, left-sided  under the breast, spreading across the chest.  He said it was not like the  incredible pain that he was experiencing in December when he had an acute  myocardial infarction.  Electrocardiograms showed no evidence of ST  elevation or Q-waves, but the patient did do better with a combination of  Percocet and one sublingual nitroglycerin, so troponin I studies were  ordered and they were 0.10, then eight hours later 0.08.  Patient's pain has  been relieved, especially he says after getting his Protonix 40 mg which he  takes twice daily for reflux.  Patient is pain-free on the morning of  discharge.  He is afebrile.  Vital signs are stable.  He is A pacing at a  rate  of 60.  His x-ray shows that the leads are in appropriate position  without pneumothorax.  The incision is healing nicely.  Only modest  swelling.  No erythema or ecchymoses.  Patient discharging on the  medications as follows.   DISCHARGE MEDICATIONS:  1.  Coreg 3.125 mg twice daily.  2.  Lasix 20 mg daily.  3.  Amiodarone 200 mg daily.  4.  Lipitor 40 mg daily at bedtime.  5.  Enteric coated aspirin 325 mg daily.  6.  Potassium chloride 20 mEq daily.  7.  Zetia 10 mg daily.  8.  Plavix 75 mg daily.  9.  Nitroglycerin 0.4 mg one tablet under the tongue every five minutes x3      doses as needed for chest pain.  10. Nexium 40 mg twice daily.  11. For pain management, Darvocet-N 100 one to two tablets every three to      four hours as needed for pain.   Patient has been given activity sheet demonstrating left upper extremity  mobility.  He is asked to keep his left arm quiet for the next four days.   DISCHARGE DIET:  Low sodium, low cholesterol diet.   WOUND CARE:  The patient  is to keep his incision dry for the next seven  days, sponge bathe until Tuesday, Feb 26, 2005.   FOLLOW UP:  1.  At [facility] Monday, Mar 04, 2005, at 12:30 to see Dr.      Vicenta Aly.  2.  ICD clinic Thursday, Mar 07, 2005, at 10 o'clock.  3.  He will see Dr. Caryl Comes in August.  The office will call with that      appointment.   BRIEF HISTORY:  Mr. Steer is a 77 year old male.  He presents for  cardioverter defibrillator placement.  He had a history of acute myocardial  infarction in December of 2005, with concurrent cardiogenic shock with  atrial fibrillation contributing to inefficient cardiac output.  He is  status post stent to the proximal LAD at that time.  He had a subsequent to  the first obtuse marginal in February 2006.  He has class II congestive  heart failure.  He has ischemic cardiomyopathy with ejection fraction of 25  to 30%.  He was doing well since his stenting in February.  He has had no  chest pain, occasionally some tingling pain under the left breast.  He is  not short of breath with his activities of daily living.  He has paroxysms  of cough which have not responded to ACE inhibitor withdrawal.  They are  protracted. They cause nausea when they do occur.  Patient has atrial  fibrillation but is now on sinus rhythm on amiodarone.  He was put on  Coumadin for his atrial fibrillation but had a GI bleed and has currently  latent Hemoccults, five out of six were positive; however, the patient is  also taking ferrous sulfate.  He has had EGD and colonoscopy, both of which  were negative studies.  He has a penicillin allergy.  He presents Feb 19, 2005, for elective implantation of cardioverter defibrillator.   HOSPITAL COURSE:  Patient presenting Feb 19, 2005.  He underwent  implantation of cardioverter defibrillator the same day.  He has had some  mild occasion of chest pain in the post procedure period which is treated with analgesia, first Percocet then  sublingual nitroglycerin which did ease  the pain, which also caused troponin I studies  to be obtained which were both relatively not elevated.  His pain has  subsided by the morning of post procedure day #1 and including only very  mild incisional tenderness in the left shoulder.  The patient is discharged  with medications and follow-up as dictated.      GM/MEDQ  D:  02/20/2005  T:  02/20/2005  Job:  [id number]   cc:   Junious Silk, M.D. [facility]   Biagio Borg, M.D. [facility]

## 2011-03-01 NOTE — Op Note (Signed)
NAME:  [name], [name]            ACCOUNT NO.:  [id number]   MEDICAL RECORD NO.:  [id number]          PATIENT TYPE:  AMB   LOCATION:  ENDO                         FACILITY:  [place]   PHYSICIAN:  Ronald Lobo, M.D.   DATE OF BIRTH:  [birth year]   DATE OF PROCEDURE:  01/07/2005  DATE OF DISCHARGE:                                 OPERATIVE REPORT   PROCEDURE:  Upper endoscopy with biopsies.   INDICATIONS:  Transiently Hemoccult positive stool in a 77 year old who has  been on aspirin and Plavix and has a history of coronary disease.   FINDINGS:  Small hiatal hernia with possible short segment Barrett's  esophagus.   PROCEDURE:  The nature, purpose, risks of the procedure were familiar to the  patient, who provided written consent. Sedation was fentanyl 40 mcg and  Versed 6 milligrams IV without arrhythmias or desaturation. The Olympus  video endoscope was passed under direct vision. The vocal cords looked  normal. The esophagus was entered without significant difficulty. The  proximal esophageal mucosa was normal. The distal esophagus had what may be  some minimal tongues of Barrett's mucosa, versus a slightly irregular Z-line  above a  small hiatal hernia. A few biopsies were obtained at the GE  junction to help differentiate between those two possibilities. I did not  see any reflux esophagitis, extensive Barrett's esophagus, varices,  infection, neoplasia or any sign of a ring or stricture.  He had a small  hiatal hernia as noted. The stomach contained no significant residual. The  gastric mucosa was essentially normal. There was some punctate erythema here  and there but no erosive or significant inflammatory changes, no ulcers,  polyps or masses including a retroflexed view of the cardia that showed a  minimally patulous diaphragmatic hiatus. The pylorus, duodenal bulb and  second duodenum looked normal. The stomach and the duodenum were reinspected  prior to removal the scope.  The patient tolerated the procedure well and  there no apparent complications.   IMPRESSION:  Essentially normal examination. Questionable short segment  Barrett's esophagus. No source of heme positive stool identified (792.1).   PLAN:  Await pathology results and proceeded colonoscopic evaluation.      RB/MEDQ  D:  01/07/2005  T:  01/07/2005  Job:  [id number]   cc:   Kirk Ruths, M.D. [facility]   L. Donnie Coffin, M.D.  [address]  [place] [address]  Fax: [phone]

## 2011-03-01 NOTE — Discharge Summary (Signed)
NAME:  [name], [name]            ACCOUNT NO.:  [id number]   MEDICAL RECORD NO.:  [id number]          PATIENT TYPE:  INP   LOCATION:  2028                         FACILITY:  [facility]   PHYSICIAN:  Junious Silk, M.D. LHCDATE OF BIRTH:  August 28, 1932   DATE OF ADMISSION:  09/15/2004  DATE OF DISCHARGE:  09/27/2004                           DISCHARGE SUMMARY - REFERRING   DISCHARGE DIAGNOSES:  1.  Anterior myocardial infarction on September 15, 2004.  2.  Renal insufficiency, improved.  3.  Atrial fibrillation, currently in normal sinus rhythm.  4.  Long-term amiodarone use.  5.  Long-term Coumadin use.  6.  Urinary tract infection, treated.  7.  Ischemic cardiomyopathy, ejection fraction 40%.  8.  Gastroesophageal reflux disease.  9.  Hyperlipidemia, treated.  10. Diverticulosis.  11. Status post back and neck surgery.  12. Status post hernia repair.   HOSPITAL COURSE:  [name] is a 77 year old male patient who presented  to the emergency room after 3 hours of moderate-to-severe substernal chest  pain associated with diaphoresis and nausea.  He denied any other  constitutional symptoms.  He was taken emergently to the cardiac  catheterization lab by Dr. Wyonia Hough. Pulsipher and was found to have a totaled  LAD lesion.  He then underwent PTCA/bare-metal stent placement to the  proximal LAD.  Because of his hemodynamics, an intra-aortic balloon pump was  placed for cardiogenic shock.  This was quickly discontinued over the next  several days.  We did attempt to place the patient on Plavix, however, he  developed a rash.  It was unclear whether or not this was from the  penicillin that he had been on, or the Plavix, therefore the Plavix was  stopped and we placed him on Ticlid; the rash disappeared.   A 2-D echo was performed which showed an EF of around 40%.  MR was 1+.  There was hypokinesis of the mid-distal anterior wall and akinesis of the  entire anteroseptal wall.   He  also developed paroxysmal atrial fibrillation and was treated with  digoxin plus amiodarone and is currently being discharged in normal sinus  rhythm.  He was placed on Coumadin.  He was also noted to have a urinary  tract infection and was treated with antibiotics, Tequin.  In addition, he  was felt to be intravascularly volume-depleted while he was on Lasix and  Aldactone.  His creatinine reached a maximum of 1.7; his Aldactone and Lasix  were stopped.  In the future, we may need to restart these medications,  however, at this time, he is unable to tolerate them due to renal function  issues.   Lab studies on the day of discharge include a pro time of 24.8, INR of 3.1,  BUN 24, creatinine 1.6, potassium 4.4.  He did have transient elevation of  his LFTs with an AST of 273, ALT of 69, AST of 53.  Hemoglobin A1c was 6.0.  Maximum CK was 5844 with 736 MB fractions.  His troponin was greater than  100.  Total cholesterol 155, HDL 28, LDL 110, triglycerides 60.  TSH 1.361.  C-reactive  protein 0.4.   DISCHARGE MEDICATIONS:  Upon discharge, the patient is going home on the  following medications:  1.  He is to continue his home Nexium.  2.  Lipitor 80 mg nightly.  3.  Digoxin 0.125 mg a day.  4.  Baby aspirin daily.  5.  Amiodarone 200 mg 2 tablets once daily.  6.  Ticlid 250 mg b.i.d.  7.  Altace 2.5 mg daily.  8.  Sublingual nitroglycerin p.r.n. chest pain.   ACTIVITY:  No straining, no lifting over 10 pounds for 1 week, increase  activity as per cardiac rehabilitation, no driving until see in the office.   DIET:  Remain on a low-fat diet.   WOUND CARE:  Clean over cath site with soap and water, no scrubbing.   FOLLOWUP:  The patient was enrolled in the Horizon Study and the research  department will contact the patient for followup.  The patient is to follow  up at the Coumadin Clinic at [facility] on Monday, October 01, 2004, at 8:30 a.m.  At this point, we will recheck  a BMET and a CBC (since  the patient is on Ticlid).  In addition, we will evaluate his renal  function.  He then has a followup appointment with Dr. Suszanne Conners, P.A.-C at [facility] on October 10, 2004 at 1:30 p.m.  At  that point, we will need to recheck a CBC because the patient is on Ticlid  and may need to go ahead and recheck a BUN and creatinine as well.   CONDITION ON DISCHARGE:  The patient is being discharged home in stable  condition.      Jeani Hawking   LB/MEDQ  D:  09/27/2004  T:  09/27/2004  Job:  [id number]   cc:   L. Donnie Coffin, M.D.  [address]  [place] [address]  Fax: [phone]

## 2011-03-01 NOTE — Assessment & Plan Note (Signed)
[facility]                              CARDIOLOGY OFFICE NOTE   NAME:[name], [name] VANHOUSEN                   MRN:          [id number]  DATE:07/15/2006                            DOB:          August 22, 1932    PRIMARY CARE PHYSICIAN:  [name], MD.   PATIENT IDENTIFICATION:  [name] is a delightful 77 year old male who  returns for routine followup.   PROBLEM LIST:  1. Coronary artery disease.      a.     Status post anterior wall myocardial infarction in 2005 treated       with PTCA and stenting of LAD and second diagonal.      b.     Most recent catheterization February 2006 with PTCA and stenting       of the OM1 with Cypher drug-eluting stent, LAD and diagonal stents       were patent.      c.     Adenosine Myoview August 2006, EF 28%, large scar but no       ischemia.  2. Congestive heart failure secondary to ischemic cardiomyopathy.      a.     EF 20-30% by echocardiogram in July 2007.      b.     Status post St. Jude ICD.  3. Paroxysmal atrial fibrillation on amiodarone but refusing Coumadin      secondary to history of GI bleed.  4. Chronic renal insufficiency. Baseline creatinine about 1.7.  5. Hypertension.  6. Hyperlipidemia.   CURRENT MEDICATIONS:  1. Amiodarone 200 a day.  2. Aspirin 325.  3. Lasix 20.  4. Plavix 75.  5. Lipitor 80.  6. Zetia 10.  7. Iron 325 b.i.d.  8. Nexium 40 b.i.d.  9. Diovan 40 in the morning, 80 at night.  10.Potassium 10 mEq a day.  11.Coreg 12.5 b.i.d.  12.Multivitamin.   ALLERGIES:  PENICILLIN.   INTERVAL HISTORY:  [name] returns today for routine followup.  Overall, he is doing quite well. He denies any chest pain. He does have  dyspnea when he walks up hills but really none on flat ground. He denies any  orthopnea, PND or lower extremity edema. He has not had any palpitations or  syncope or presyncope. He is complaining of decreased sexual drive and  decreased sexual  function.   PHYSICAL EXAMINATION:  GENERAL:  Well appearing, no acute distress,  ambulates around the clinic without any respiratory difficulty.  HEENT:  Sclera anicteric. EOMI. There is no xanthelasma. Mucous membranes  are moist.  NECK:  Supple. No JVD. Carotids are 2+ bilaterally without any bruits. There  is  no lymphadenopathy or thyromegaly.  CARDIAC:  He has got distant heart sounds, he is regular with no obvious  murmurs, rubs or gallops.  LUNGS:  Clear.  ABDOMEN:  Soft, nontender, nondistended. There is no hepatosplenomegaly, no  bruits, no masses.  EXTREMITIES:  Warm with no cyanosis or edema. Perhaps mild clubbing.  NEUROLOGIC:  Alert and oriented x3. Cranial nerves II-XII are intact. Moves  all 4 extremities without difficulty.   EKG shows  sinus rhythm with a first degree AV block, previous anterior  infarct. No acute ST-T wave changes. Rate is 60.   ASSESSMENT/PLAN:  1. Coronary artery disease. This is stable without any evidence of ongoing      ischemia. He is on an excellent medical regimen which we will continue.  2. Congestive heart failure secondary to ischemic cardiomyopathy.      Currently NYHA class 2 symptoms. Unfortunately his blood pressure      prevents further titration of his ARB or beta blocker beta blocker at      this time. Volume status looks good.  3. Paroxysmal atrial fibrillation maintaining sinus rhythm on amiodarone.      Continue his refuse Coumadin. Will check his PFTs and thyroid functions      at his next visit.  4. Hyperlipidemia. Continue current therapy. Check his lipids at the next      visit.  5. Erectile dysfunction. We have given a prescription for Viagra 50 mg      tablets to try to see if this helps. I have made it very clear to him      and his wife that he should not use nitroglycerin in conjunction with      this or the results could be life-threatening.  6. Disposition. Return to clinic in 4 months for routine followup. I have       suggested to him that he start walking on a daily basis to get himself      in better physical condition.       Shaune Pascal. Bensimhon, MD     DRB/MedQ  DD:  07/15/2006  DT:  07/16/2006  Job #:  [id number]   cc:   [name], MD

## 2011-03-01 NOTE — H&P (Signed)
NAME:  [name], [name] NO.:  [id number]   MEDICAL RECORD NO.:  [id number]          PATIENT TYPE:  INP   LOCATION:  2022                         FACILITY:  [facility]   PHYSICIAN:  Jacqulyn Ducking, M.D.  DATE OF BIRTH:  [birth year]   DATE OF ADMISSION:  05/05/2005  DATE OF DISCHARGE:                                HISTORY & PHYSICAL   PRIMARY CARDIOLOGIST:  Glori Bickers, M.D.   PRIMARY CARE PHYSICIAN:  L. Donnie Coffin, M.D.   PATIENT PROFILE:  A 77 year old white male with a prior history of CAD who  presents with recurrent chest pain.   PROBLEM LIST:  1.  CAD.      1.  September 15, 2004 - anterior MI.      2.  September 15, 2004 - cardiac catheterization revealing total LAD          stenting with a 2.5 x 24 [id number] bare metal stent.  The 80% D2 was          angioplastied.  Hospital course complicated by cardiogenic shock          requiring IABP and paroxysmal atrial fibrillation requiring          amiodarone and digoxin therapy.      3.  November 20, 2004 - cardiac catheterization, OM-1 80% successfully          stented with a 2.5 x 13 mm Cypher drug-eluting stent.  The LAD and          diagonal were within normal limits.  2.  Ischemic cardiomyopathy/congestive heart failure.  EF of 20% to 30%.      1.  Status post St. Jude's ICD on Feb 19, 2005.  3.  History of paroxysmal atrial fibrillation, previously treated with      Coumadin and stopped secondary to GI bleed.  4.  GI bleed.      1.  Previously on Coumadin, discontinued.  Most recent EGD colonoscopy          approximately 6 weeks ago.  The patient was told no active bleeding.          Followed by Dr. Cristina Gong.  5.  Hiatal hernia.  6.  Barrett's esophagus.  7.  Diverticulosis.  8.  GERD.  9.  Peptic ulcer disease.  10. Hyperlipidemia.   HISTORY OF PRESENT ILLNESS:  A 77 year old male with a history of CAD,  status post anterior MI in December of 2005 with PCI of the LAD and D1  complicated by cardiogenic  shock requiring IABP and paroxysmal atrial  fibrillation requiring amiodarone and Coumadin therapy.  He subsequently  developed a GI bleed, requiring that his Coumadin be discontinued.  He has  been followed by GI since with EGD and colonoscopy most recently about 6  weeks ago, performed secondary to persistent melena.  In February of 2006,  she had recurrent chest discomfort and underwent PCI of the OM-1 with Cypher  drug-eluting stent.  Most recently, he underwent ICD placement in May of  2006 for persistent low EF.  The patient has been doing well with stable  dyspnea  on exertion on inclines only, otherwise doing well with flat  surfaces, and has not been having any chest discomfort until approximately  11 p.m. last night when he laid down for bed and developed 7/10 left chest  squeezing that lasted 3-5 minutes and resolved spontaneously.  He noted that  he had no associated symptoms, and that this was not similar to his previous  angina.  This discomfort was a little more focal.  Throughout the night, he  had approximately 3-4 episodes that occurred each time he would lay back  down after going to the bathroom.  The symptoms, again, would only last  several minutes and resolved spontaneously.  If he laid on his right side,  he noticed that the pain would more likely be on his right side; whereas, if  he laid on his left, it was on the leg.  The symptoms were worsened with  deep breathing, as well as moving the upper body.  He woke approximately 8  a.m. this morning with 8-9/10 discomfort, similar to what he had experienced  throughout the night without associated symptoms.  He took 2 nitroglycerin  with some relief, but after a short time, the symptoms recurred.  The  symptoms have been more or less constant since 8 a.m., and when his wife  came home from church, she advised him that he needed to go to the ER, and  he showed up here at noon.  He has continued to have between 7-9/10  chest  discomfort while in the ER without ECG changes.  His cardiac enzymes are  currently pending.  His pain was only minimally relieved with morphine and  nitroglycerin.  After nitroglycerin, his pressure did drop into the 90s,  requiring fluid bolus.   ALLERGIES:  AMOXICILLIN/PENICILLIN.   HOME MEDICATIONS:  1.  Amiodarone 10 mg daily.  2.  Aspirin 325 mg daily.  3.  Coreg 3.125 mg b.i.d.  4.  Cozaar 50 mg daily.  5.  Lasix 20 mg daily.  6.  Nexium 40 mg b.i.d.  7.  Plavix 75 mg daily.  8.  Potassium chloride 20 mEq daily.  9.  Lipitor 80 mg daily.  10. Zetia 10 mg daily.  11. Iron sulfate daily.  12. Nitroglycerin 0.4 mg sublingual p.r.n. chest pain.   FAMILY HISTORY:  Mother died in her 72s of old age.  Father died at age 20  of a cerebral bleed.  He had 3 brothers.  He thinks all of them died from  COPD.  There was no CAD in his siblings.   SOCIAL HISTORY:  He lives in [place] with his wife.  He is a retired  Airline pilot in [employer]	[place], and he smoked for approximately 60  years using cigarettes, and then primarily cigars and pipes over the  majority of that time.  He does not drink alcohol and does not use drugs.   REVIEW OF SYSTEMS:  Positive for straining on urination.  Positive for  history of a GI bleed.  No current melena with recent EGD and colonoscopy 6  weeks ago.  All other systems reviewed and negative, except for as pointed  out in the HPI.   PHYSICAL EXAMINATION:  VITAL SIGNS:  Temperature 98.1, heart rate 59,  respirations 18, blood pressure 103/62, pulse oximetry 93% on room air.  GENERAL:  He is a pleasant white male in no acute distress.  Awake, alert,  and oriented x3.  NECK:  Normal carotid upstrokes with  a soft bruit on the right versus  radiated murmur.  LUNGS:  Respirations regular and unlabored with a few crackles at the bases. CARDIAC:  Distal S1 and S2 with a 2/6 systolic murmur at the upper sternal  border.  ABDOMEN:  Rounded,  soft, nontender, nondistended.  Bowel sounds present.  EXTREMITIES:  Warm and dry, pink.  No clubbing, cyanosis, or edema.  Dorsalis pedis and posterior tibial pulses were 1+ and equal bilaterally.  He does have soft bilateral femoral bruits.   His chest x-ray shows stable cardiomegaly.  His EKG shows sinus bradycardia  with a rate of 59, right axis deviation.  No ST changes.   LABORATORY DATA:  Hemoglobin 12.1, hematocrit 36.3.  WBC 9.6, platelets 323.  Sodium 140, potassium 4.1, chloride 104, CO2 of 28.6, BUN 22, creatinine  1.8, glucose 103.  His PTT was 28, INR 1.0, PT 13.3.  Cardiac enzymes are  currently pending.   ASSESSMENT AND PLAN:  1.  The patient presents with chest pain that is worse with breathing and      repositioning.  It is dissimilar from his previous angina.  Will cycle      enzymes and check a D-dimer.  Questionable musculoskeletal cause.  Will      continue his home medications, which include a beta blocker, ARB,      aspirin, Statin, Plavix, and Zetia.  Will avoid using heparin, given his      history of GI bleed, and, provided that he rules out, we plan on      outpatient functional study.  If his D-dimer is positive, we will likely      plan for a VQ scan, given his renal insufficiency.  2.  Ischemic cardiomyopathy.  He does have some crackles in the base.  Will      check a BNP.  Will continue his home medications.  3.  Hyperlipidemia.  Continue Statin and Zetia.  Check an FLP.  4.  Paroxysmal atrial fibrillation.  He is currently in sinus rhythm.  He is      to continue beta blocker.  No anticoagulation, with the exception of      aspirin and Plavix.  5.  Peptic ulcer disease.  Will continue his Nexium.  6.  Renal insufficiency.  Will follow creatinine.  His creatinine currently      is 1.8.       CRB/MEDQ  D:  05/05/2005  T:  05/06/2005  Job:  [id number]

## 2011-03-01 NOTE — Op Note (Signed)
NAME:  [name], [name] NO.:  [id number]   MEDICAL RECORD NO.:  [id number]          PATIENT TYPE:  INP   LOCATION:  2899                         FACILITY:  [facility]   PHYSICIAN:  Deboraha Sprang, M.D.  DATE OF BIRTH:  1932/01/23   DATE OF PROCEDURE:  02/19/2005  DATE OF DISCHARGE:                                 OPERATIVE REPORT   POSTOPERATIVE DIAGNOSES:  1.  Ischemic cardiomyopathy with depressed left ventricular function.  2.  Class I heart failure.  3.  Narrow QRS.   POSTOPERATIVE DIAGNOSES:  1.  Ischemic cardiomyopathy with depressed left ventricular function.  2.  Class I heart failure.  3.  Narrow QRS.   PROCEDURES:  Dual-chamber defibrillator implantation with intraoperative  defibrillation threshold testing.   DESCRIPTION OF PROCEDURE:  Following the attainment of informed consent, the  patient was brought to the electrophysiology laboratory and placed on the  fluoroscopic table in the supine position.  After routine prep and drape of  the left upper chest, lidocaine was infiltrated in the prepectoral  subclavicular region.  An incision was made and carried down to the layer of  the prepectoral fascia using electrocautery and sharp dissection.  In fact,  the incision violated the pectoral fascia at the superior aspect of the  pocket.  The pocket was formed using electrocautery and sharp dissection.  Hemostasis was obtained.   Thereafter, attention was turned to gaining access of the extrathoracic left  subclavian vein, which was accomplished with only mild difficulty, but  without the aspiration of air or puncture of the artery.  Two separate  venipunctures were accomplished.  Guidewires were placed and retained and 7  French tear-away introducer sheaths were placed sequentially that allowed  for the passage of a [place] [id number] ICD and a Medtronic [id number] 52  cm lead, serial [id number].  Under fluoroscopic guidance these were  manipulated  in the right ventricular apex and the right atrial appendage,  respectively, where the bipolar R wave was 8.6 MV with a pacing impedence of  536 ohms and a threshold of 0.5 volts at 0.5 msec.  The current threshold  was 0.9 MA and there was no diaphragmatic pacing at 10 volts.   The bipolar P wave was 3.3 MV with a pacing impedence of 759 ohms and a  threshold of 1.8 volts at 0.5 msec.  The current threshold was 3.2 MA.  With  these acceptable parameters recorded, the leads were secured to a [place] ICD, serial [id number].  AV pacing was identified.  Through the device,  the bipolar P wave was 3 MV with a pacing impedence of 475 ohms and a  threshold of 1.5 volts at 0.5 msec.  R wave was 9 MV with a pacing impedence  of 455 ohms with a threshold of 0.5 volts at 0.5 msec pulse width.   At this point, defibrillation threshold testing was undertaken.  Ventricular  fibrillation was induced via the T-wave.  After a total duration of 7.5  seconds, a 15 joule shock was delivered through a measured resistance of 37  ohms, terminating  ventricular fibrillation and restoring sinus rhythm.   After a wait of five to six minutes, ventricular fibrillation was reinduced  via the T-wave shock.  After a total duration of six seconds, a 15 joule  shock was delivered through a measured resistance of 36 ohms, terminating  ventricular fibrillation and restoring an AV paced rhythm.  At this point,  the device was implanted.  The leads and the pulse generator were secured to  the prepectoral fascia.  Surgicel was placed at the superior margin of the  device where the pectoral fascia had been violated.  The wound was then  closed in three layers in a normal fashion.  The wound was washed and dried  and Benzoin and Steri-Strip dressing was applied.  The needle counts, sponge  counts and instrument counts were correct at the end of the procedure,  according to the staff.  The patient tolerated the procedure  without  apparent complication.      SCK/MEDQ  D:  02/19/2005  T:  02/19/2005  Job:  [id number]   cc:   Junious Silk, M.D. [facility]   Electrophysiology Laboratory   [facility]

## 2011-03-01 NOTE — Assessment & Plan Note (Signed)
[facility]                            CARDIOLOGY OFFICE NOTE   NAME:[name], [name] COODY                   MRN:          [id number]  DATE:12/24/2006                            DOB:          [birth year]    PRIMARY CARE PHYSICIAN:  [name], M.D.   INTERVAL HISTORY:  [name] is a delightful 77 year old male with  coronary artery disease, status post previous anterior wall myocardial  infarction, treated with stenting of the LAD.  He is also status post  stenting of a marginal branch.   PAST MEDICAL HISTORY:  Also significant for congestive heart failure,  secondary to severe ischemic cardiomyopathy.  Most recent EF was 23% by  Cardiolite.  He is status post single-chamber [employer] Jude ICD.  He also has  chronic renal insufficiency and atrial fibrillation for which he is  maintained on amiodarone, but refuses to take Coumadin.  He is here for  routine followup.  Since we last saw him, he underwent a Myoview for  some atypical chest pain.  This showed an EF of 23% with a large  anterior scar and just minimal anterior wall ischemia.  He also had  surveillance PFTs for his amiodarone therapy, which were okay.  In  general, he is doing fairly well.  He says that he can walk on flat  ground for several miles, but as soon as there is any hill or he bends  over, he does get short of breath.  He denies chest pain, no lower  extremity edema, no orthopnea, no PND, no syncope, no ICD shocks.   CURRENT MEDICATIONS:  1. Amiodarone 200 a day.  2. Aspirin 325.  3. Lasix 20 a day.  4. Plavix 75.  5. Lipitor 80.  6. Zetia 10.  7. Iron 325.  8. Nexium 40 b.i.d.  9. Diovan 40 in the morning, 80 at night.  10.Potassium 10 a day.  11.Coreg 18.75 b.i.d.  12.Ambien p.r.n.   PHYSICAL EXAM:  He is an elderly male in no acute distress, ambulates  around the clinic slowly without any respiratory difficulty.  Blood  pressure is 82/48, checked manually in both  arms.  Heart rate is 60.  HEENT:  Sclerae are anicteric.  EOMI.  There are a few scattered  xanthelasmas.  Oropharynx is clear.  Mucous membranes are moist.  NECK:  Supple, no JVD.  Carotids are 2+ bilaterally without any bruits.  There is no lymphadenopathy or thyromegaly.  CARDIAC:  He has a regular rate and rhythm with an S4 and no S3, no  murmur.  LUNGS:  Clear.  ABDOMEN:  Soft, nontender, nondistended.  There is no  hepatosplenomegaly, no bruits, no masses appreciated.  EXTREMITIES:  Warm with no cyanosis, clubbing or edema.  NEUROLOGIC:  He is alert and oriented times three.  Cranial nerves II  through XII are intact.  Moves all four extremities without difficulty.  Affect is pleasant.   EKG:  Shows normal sinus rhythm at a rate of 60 with a previous anterior  infarct and nonspecific T-wave abnormalities otherwise.   ASSESSMENT AND PLAN:  1. Congestive  heart failure, secondary to ischemic cardiomyopathy.      Overall, I would put him at NYHA Class II-III.  His blood pressure      is quite low today, but he is asymptomatic, so we will keep his      therapy where it is at.  We did discuss the possibility of adding      digoxin, but he is not interested in adding another medication.      Given his blood pressure, I would not consider spironolactone at      this time.  Given his severe LV dysfunction and low blood pressure,      I think we have to watch him very closely.  At some point, we may      want to consider a dys-synchrony echo to evaluate for possible      upgrade to a biventricular device.  2. Coronary artery disease.  This is stable.  Recent Myoview just      showed minimal ischemia.  Continue current therapy.  3. Hyperlipidemia.  Continue Lipitor and Zetia.  We will check lipids      at next visit.  4. Paroxysmal atrial fibrillation.  Maintaining sinus rhythm on      amiodarone.  Surveillance labs and PFTs look okay.   DISPOSITION:  We will see him back in six to  eight weeks for routine  followup.     Shaune Pascal. Bensimhon, MD  Electronically Signed    DRB/MedQ  DD: 12/24/2006  DT: 12/26/2006  Job #: [id number]   cc:   [name] Reel, MD

## 2011-03-01 NOTE — Consult Note (Signed)
NAME:  [name], [name]            ACCOUNT NO.:  [id number]   MEDICAL RECORD NO.:  [id number]          PATIENT TYPE:  INP   LOCATION:  4740                         FACILITY:  [facility]   PHYSICIAN:  John C. Amedeo Plenty, M.D.    DATE OF BIRTH:  1931-11-12   DATE OF CONSULTATION:  10/10/2004  DATE OF DISCHARGE:                                   CONSULTATION   REASON FOR CONSULTATION:  GI bleeding.   HISTORY OF PRESENT ILLNESS:  The patient is a 77 year old white male who  sustained a significant myocardial infarction status post intra-aortic  balloon pump and metal stent placement to the proximal LAD, was discharged  on December 15.  He was discharged on Coumadin, Ticlid, and aspirin and had  a hemoglobin of approximately 10.  He noticed some dark stool while in the  hospital and his INR on discharge was 3.1.  His stools have remained fairly  black since then and he has felt weak with occasional mild lower abdominal  cramps.  His stools have been formed, however.  His hemoglobin today was  found to be 5.9 with a platelet count of 391, INR of 4.5. He was also found  to have heme positive stools yesterday and was brought in for admission.  He  denies any nausea, vomiting, maroon, or bloody stools, and denies any  orthostatic dizziness or near syncope.  He had an EGD and colonoscopy in  February 2003 which showed a small hiatal hernia and mild diverticulosis  respectively.   PAST MEDICAL HISTORY:  Coronary artery disease, atrial fibrillation on his  last admission, hyperlipidemia, ischemic cardiomyopathy.   PAST SURGICAL HISTORY:  History of hernia repair, history of back and neck  surgery.   ALLERGIES:  None known.   FAMILY HISTORY:  Negative for coronary artery disease or GI malignancy.   SOCIAL HISTORY:  The patient has a 40-pack-year history of smoking, denies  alcohol use.   DISCHARGE MEDICATIONS:  Lipitor, digoxin, baby aspirin, Amiodarone, Ticlid,  Altace, Coumadin is currently on  hold.   PHYSICAL EXAMINATION:  GENERAL:  Well developed, well nourished, pale white male in no acute  distress.  HEART:  Regular rate and rhythm without murmurs.  ABDOMEN:  Soft, nondistended, with normal active bowel sounds, no  hepatosplenomegaly, masses, or guarding.   IMPRESSION:  GI bleeding is exacerbated by anticoagulant, the patient is  hemodynamically stable but a 5 gram drop in hemoglobin over the last nine  days.   PLAN:  Since he is hemodynamically stable and has eaten in the last four  hours, we will hold off on the EGD tonight.  He will be electively  transfused with further attempts to lower his INR and we will proceed with  EGD in the morning.       JCH/MEDQ  D:  10/10/2004  T:  10/10/2004  Job:  [id number]   cc:   Junious Silk, M.D. [facility]   Ronald Lobo, M.D.  [address]  [place], [place] [address]  Fax: [phone]   L. Donnie Coffin, M.D.  [address]  [place] [address]  Fax: [phone]

## 2011-03-01 NOTE — H&P (Signed)
NAME:  [name], [name]            ACCOUNT NO.:  [id number]   MEDICAL RECORD NO.:  [id number]          PATIENT TYPE:  INP   LOCATION:  2929                         FACILITY:  [facility]   PHYSICIAN:  Daniel Nones, M.D. LHCDATE OF BIRTH:  [birth year]   DATE OF ADMISSION:  09/15/2004  DATE OF DISCHARGE:                                HISTORY & PHYSICAL   PRIMARY CARE PHYSICIAN:  L. Donnie Coffin, M.D.   CHIEF COMPLAINT:  Chest pain.   HISTORY OF PRESENT ILLNESS:  The patient is a 77 year old male with minimal  past medical history except for hyperlipidemia who complains of three hours  of moderate to severe substernal chest pain associated with diaphoresis and  nausea.  He has had no shortness of breath, palpitations, presyncope,  orthopnea, PND, or edema.   PAST MEDICAL HISTORY:  1.  Gastroesophageal reflux disease.  2.  Hyperlipidemia.  3.  Neck and back surgery.  4.  Herniorrhaphy.  5.  Diverticulosis.   ALLERGIES:  No known drug allergies.   MEDICATIONS:  1.  Nexium.  2.  Zocor.   SOCIAL HISTORY:  He has a 40-pack-year smoking history, but is now smoking  only three to four packs per day.  He does not drink alcohol.   FAMILY HISTORY:  Negative for coronary artery disease.   REVIEW OF SYSTEMS:  Positive for some sweats, chest pain, dyspnea on  exertion which is no change from chronic.  The remainder of his 10-point  review is negative.   PHYSICAL EXAMINATION:  VITAL SIGNS:  Blood pressure 97.0, blood pressure  121/70, heart rate 66, respiratory rate 26, saturating 100% on room air.  GENERAL:  He is in moderate distress.  HEENT:  Unremarkable.  NECK:  JVP normal, carotid upstrokes normal, no bruits.  LUNGS:  Clear.  HEART:  Regular rate and rhythm without murmurs, rubs, or gallops.  ABDOMEN:  Benign.  RECTAL:  Heme negative.  EXTREMITIES:  No cyanosis, clubbing, or edema.  NEUROLOGY:  Nonfocal.   Chest x-ray pending.   EKG shows sinus rhythm at a rate of  about 66 with some ST segment elevation  V1 and V2.  Subsequent EKG showed more definitive ST segment elevation in  the true lateral leads.  He also has had reciprocal ST segment depression in  the inferior leads.   LABORATORY DATA:  White count 9, hemoglobin 12.4, hematocrit 36.4, platelets  268, creatinine 1.4, MB 1.7, troponin less than 0.05.  The remainder of his  labs are pending.   ASSESSMENT:  1.  Acute myocardial infarction.  The patient was given aspirin and      nitroglycerin thus far.  The nitroglycerin will be up-titrated for      symptom management.  Morphine will also be used.  The catheterization      line was immediately activated and he is currently being enrolled in the      [employer] trial.  As such, he will receive either bivalirudin or      heparin/Integrilin for primary PCI.  Plavix will be given as dictated by      Wyonia Hough. Pulsipher,  M.D. [facility], who will be doing the case.  Further      management will be determined based on his anatomy.       RPK/MEDQ  D:  09/16/2004  T:  09/16/2004  Job:  [id number]   cc:   L. Donnie Coffin, M.D.  [address]  [place] [address]  Fax: [phone]

## 2011-03-01 NOTE — Discharge Summary (Signed)
NAME:  [name], [name]            ACCOUNT NO.:  [id number]   MEDICAL RECORD NO.:  [id number]          PATIENT TYPE:  INP   LOCATION:  [id number]                         FACILITY:  [facility]   PHYSICIAN:  Junious Silk, M.D. LHCDATE OF BIRTH:  November 11, 1931   DATE OF ADMISSION:  10/10/2004  DATE OF DISCHARGE:  10/13/2004                           DISCHARGE SUMMARY - REFERRING   DISCHARGE DIAGNOSES:  1.  Gastrointestinal bleed secondary to gastritis.  2.  Long-term Coumadin therapy, now stopped.  3.  Elevated BNP, diuresed.  4.  Known coronary artery disease.  5.  History of previous myocardial infarction on aspirin and Ticlid (patient      allergic to Plavix).  6.  Paroxysmal atrial fibrillation on amiodarone.  No Coumadin secondary to      history of bleed.  7.  Urinary tract infection status post treatment.  8.  Long-term amiodarone therapy.  9.  Ejection fraction 42%, on ACE inhibitor.   HISTORY OF PRESENT ILLNESS/HOSPITAL COURSE:  Mr. Rabun is a 77 year old  male patient who was recently admitted with a myocardial infarction and a  PCI of the LAD.  He is on amiodarone and long-term Coumadin for paroxysmal  atrial fibrillation.  He had an approximate five point drop in his  hemoglobin and was hospitalized.  His Coumadin was reversed with vitamin K.  A GI consult was obtained and, eventually, the patient underwent an EGD that  revealed diffuse antral gastritis with no active bleeding.  H. pylori  antibody was negative.   The patient did have a slightly elevated BNP and was given Lasix.  Please  note that he did require several transfusions during this hospitalization.  He was also noted to have a UTI and this was treated with Tequin.  On  discharge, his hemoglobin was 11.4, hematocrit 33.4, platelets 338, BUN 16,  creatinine 1.4, and he was discharged to home in stable condition.  He was  discharged to home on the following medications:  Lasix 20 mg a day,  potassium 20 mEq a day,  Altace 2.5 mg a day.  He is to continue his Nexium,  baby aspirin, Ticlid, and amiodarone.  He is to stop his digoxin and Coumadin.  He may utilize Tylenol, but I have  asked him to avoid Advil, ibuprofen, and Motrin. Activity is as tolerated.  Remain on a low-fat diet.  Call for questions or concerns at [phone], and  Dr. Danton Clap office will call to follow up.      Jeani Hawking   LB/MEDQ  D:  10/13/2004  T:  10/13/2004  Job:  [id number]   cc:   L. Donnie Coffin, M.D.  [address] [address]  Fax: [phone]   Junious Silk, M.D. [facility]   Joyice Faster. Rolla Flatten., M.D.  [address], [place]  [place]  [place] [address]  Fax: [phone]

## 2011-03-01 NOTE — H&P (Signed)
NAME:  MEKHI, RAYER NO.:  [id number]   MEDICAL RECORD NO.:  [id number]          PATIENT TYPE:  INP   LOCATION:  2899                         FACILITY:  [facility]   PHYSICIAN:  Deboraha Sprang, M.D.  DATE OF BIRTH:  03-16-1932   DATE OF ADMISSION:  02/19/2005  DATE OF DISCHARGE:                                HISTORY & PHYSICAL   ELECTROPHYSIOLOGIST:  Deboraha Sprang, M.D.   CARDIOLOGIST:  Junious Silk, M.D. [facility].   PRIMARY CAREGIVER:  Biagio Borg, M.D. [facility].   HISTORY OF PRESENT ILLNESS:  Mr. Varriale is a 77 year old male presenting  Feb 19, 2005 for implantation of cardioverter defibrillator.  He has a  history of acute anterior myocardial infarction in December 2005 with  concurrent cardiogenic shock and atrial fibrillation.  He was stented to the  LAD and treated medically.  He subsequently had a stent placed to a residual  stenosis in his obtuse marginal in February 2006.  The patient has class II  congestive heart failure.  He is mildly short of breath when he tries to  walk uphill, and he walk on a level surface without stopping to rest.  He  has ischemic cardiomyopathy, ejection fraction 25-30%.  He has done well  since his stenting.  He has occasional tingling and pain under the left  breast at times, but this is nowhere near the encompassing burning pain  throughout his whole chest that he experienced in December 2005 during his  heart attack.  He has paroxysms of cough which have not responded to ACE  withdrawal.  These are protracted.  They cause nausea when they occur.  His  atrial fibrillation is now in sinus rhythm on amiodarone therapy.  He was  placed on Coumadin for his atrial fibrillation, but had subsequent GI  bleeding.  This was discontinued.  His last hemoccult studies, 5/6 were  positive.  The patient is continuing on iron therapy, but  esophagogastroduodenoscopy and colonoscopy workup were negative.   ALLERGIES:  PENICILLIN.   MEDICATIONS:  1.  Angiotensin receptor blocker.  2.  Amiodarone 200 daily.  3.  Aspirin 325 mg daily.  4.  Coreg 3.125 mg b.i.d.  5.  Lasix 20 mg daily.  6.  Nexium 40 mg daily.  7.  Plavix 75 mg daily.  8.  Potassium supplementation.  9.  Lipitor 80 mg daily.  10. Zetia 10 mg daily.   SOCIAL HISTORY:  The patient is married.  Has one child.  Does not smoke,  although he had been smoking up until the time of his heart attack in  December 2005.  He does not use alcohol or recreational drugs.   REVIEW OF SYSTEMS:  CONSTITUTIONAL:  He is not indicating any uncontrolled  weight gain or loss.  No fevers, chills, or night sweats.  HEENT:  No nasal  discharge.  He does have a cough, as mentioned in the history of present  illness.  No vertigo, no photophobia, no diplopia.  INTEGUMENT:  No rashes  or nonhealing ulcerations.  CARDIOPULMONARY:  No dyspnea.  He has mild  dyspnea  when walking uphill.  Chest pain as indicated in the history of  present illness.  No presyncope or syncope.  No claudication.  No lower  extremity edema.  No palpitations.  No orthopnea or paroxysmal nocturnal  dyspnea.  He has once again cough as indicated in history of present  illness.  UROGENITAL:  The patient has no dysuria, however does have some  urinary problems.  He has mild nocturia.  GI:  No bright red blood per  rectum.  No melena.  Hemoccult studies are positive.  The patient is on iron  therapy.  NEUROLOGIC:  No neuro deficits.  No anxiety.  No depression.   PHYSICAL EXAMINATION:  GENERAL:  This is an alert and oriented male in no  acute distress, well nourished.  VITAL SIGNS:  Temperature 97.5; blood pressure 93/49; pulse 57 and regular;  respirations 18; oxygen saturation 96% on room air; height 5 feet 11 inches;  weight 174 pounds.  LUNGS:  Clear to auscultation and percussion bilaterally.  HEENT:  Eyes:  Pupils equal, round, and reactive to light.  Extraocular  movements are intact.  No icterus or  xanthomas.  NECK:  Supple.  No carotid bruits auscultated.  No cervical lymphadenopathy.  ABDOMEN:  Soft, nondistended.  Bowel sounds are present.  EXTREMITIES:  Show no evidence of edema.  Dorsalis pedis pulses are readily  palpable at 4/4 bilaterally.  Radial 4/4 bilaterally.  NEUROLOGIC:  No neurologic deficits noted.   PAST SURGERY:  1.  In 1969, finger amputation secondary to work accident.  2.  In 1972, back surgery.  3.  In 1985, cervical laminectomy.  4.  In 1989, bilateral herniorrhaphies.   IMPRESSION:  1.  History of acute anterior myocardial infarction, December 2005,      demonstrating two-vessel coronary artery disease, status post stenting      in the LAD and the obtuse marginal.  2.  Ischemic cardiomyopathy, ejection fraction 25-30%.  3.  Class II congestive heart failure.  4.  History of atrial fibrillation, sinus rhythm on amiodarone.  5.  Anemia.  Negative GI workup. The patient is on iron supplementation.   PLAN:  For implantable cardioverter defibrillator, Feb 19, 2005, Dr. Virl Axe.      GM/MEDQ  D:  02/19/2005  T:  02/19/2005  Job:  [id number]

## 2011-03-01 NOTE — Cardiovascular Report (Signed)
NAME:  [name], [name]            ACCOUNT NO.:  [id number]   MEDICAL RECORD NO.:  [id number]          PATIENT TYPE:  INP   LOCATION:  2929                         FACILITY:  [facility]   PHYSICIAN:  Junious Silk, M.D. LHCDATE OF BIRTH:  Jan 09, 1932   DATE OF PROCEDURE:  09/15/2004  DATE OF DISCHARGE:                              CARDIAC CATHETERIZATION   PROCEDURE PERFORMED:  1.  Right and left heart catheterization with coronary angiography, left      ventriculography, and abdominal aortography.  2.  Placement of intraaortic balloon pump via the left femoral artery.  3.  Percutaneous transluminal coronary angioplasty with stent placement in      the proximal left anterior descending artery.  4.  Percutaneous transluminal coronary angioplasty of the second diagonal      branch.   INDICATION:  [name] is a 77 year old male who presented to the  emergency room with an acute anterior lateral wall myocardial infarction.  He was brought emergently to the cardiac catheterization laboratory.   CATHETERIZATION PROCEDURAL NOTE:  We initially place a 6 French sheath was  placed in the right femoral artery.  Coronary angiography was performed with  standard Judkins 6 French catheters.  Left ventriculography and abdominal  aortography were performed with an angled pigtail catheter.  Contrast was  Visipaque.  During the percutaneous coronary intervention, we placed an 8  French in the right femoral vein.  We performed a right heart  catheterization with a Swan-Ganz catheter after completion of the  percutaneous coronary intervention to be described below.   CATHETERIZATION RESULTS:   HEMODYNAMICS:  (Right heart pressures were measured after completion of the  percutaneous coronary intervention).  1.  Right atrial mean pressure 8, right ventricular pressure 58/10,      pulmonary artery pressure 52/22, pulmonary capillary wedge mean pressure      27 with a V wave of 46.  2.  Left  ventricular pressure 108/29.  3.  Aortic pressure 108/62.  4.  There is no aortic valve gradient.   LEFT VENTRICULOGRAM:  There is severe akinesis of the anterior wall with  dyskinesis of the apical wall.  Ejection fraction calculated at 42%.  There  is 3-4+ moderate to severe mitral regurgitation.   Abdominal aortogram reveals mild diffuse atherosclerotic disease of the  distal abdominal aorta and proximal iliac arteries.  The renal arteries are  patent.   CORONARY ARTERIOGRAPHY (RIGHT DOMINANT):  Left main has distal 20% stenosis.   Left anterior descending artery is 100% occluded in the proximal vessel just  after a small first diagonal branch.  There is TIMI-0 flow into the distal  vessel.  After we established reperfusion in the LAD, the LAD was  demonstrated to give rise to a very large bifurcating second diagonal branch  which also had an 80% stenosis at its ostium.   Left circumflex has as 50% stenosis in the ostium.  The circumflex gives  rise to a large first obtuse marginal and small second and third obtuse  marginal branch.  The first obtuse marginal has an 80% stenosis proximally.   Right coronary artery  is a large, dominant vessel.  There is a diffuse 30%  stenosis in the proximal vessel and a 20% stenosis in the distal vessel.  Distal right coronary artery gives rise to large posterior descending artery  and small first posterior lateral branch and normal size second posterior  lateral branch.   IMPRESSION:  1.  Significantly decreased left ventricular systolic function secondary to      an acute anterior lateral wall myocardial infarction.  2.  Moderate to severe mitral regurgitation.  3.  Two-vessel coronary artery disease.  Culprit for the acute myocardial      infarction is the 100% occlusion of the proximal left anterior      descending artery prior to the bifurcation of a very large diagonal      branch.  There is also significant disease in the first obtuse  marginal      branch.   PLAN:  Percutaneous intervention of the left anterior descending artery.  See below.   PERCUTANEOUS TRANSLUMINAL CORONARY ANGIOPLASTY PROCEDURAL NOTE:  We utilized  the pre-existing 6 French sheath in the right femoral artery.  The patient  was enrolled in the HORIZON study and randomized to treatment with Angiomax  which was administered per protocol.  We used a 6 [place] JL-4 guiding  catheter.  A Hi-Torque Floppy wire was advanced successfully beyond the  occlusion and this wire actually advanced into the diagonal branch.  We then  advanced an Asahi soft wire beyond the occlusion into the distal LAD.  This  established partial reperfusion.  We then performed percutaneous  transluminal coronary angioplasty of the left anterior descending artery  with a 3.0 x 15-mm Quantum balloon inflated to 8 atmospheres.  Following  this, we went in with a 2.5 x 15-mm Maverick balloon into the second  diagonal branch and inflated this to 4 atmospheres.  However, the balloon  appeared to be too large for this branch.  We therefore went back with a 2.0  x 15-mm Maverick balloon and inflated this to 8 atmospheres in the second  diagonal branch.  We then went back with our 2.5 x 15-mm Maverick balloon in  the LAD and inflated this to 8 atmospheres.  Finally, we went back in with  our 2.0 x 15-mm Maverick balloon in the diagonal branch and inflated to 6  atmospheres.  Following this, we positioned a 2.5 x 24-mm AVES 660 stent  across a diseased segment of LAD.  This stent did extent across the origin  of the second diagonal branch.  We removed our Floppy wire from the  diagonal.  We deployed this stent at a deployment pressure of 8 atmospheres.  We then readvanced our Floppy wire through the side struts of the stent into  the second diagonal branch.  We then went back with a 2.5 x 15-mm Quantum balloon and positioned this in the distal aspect of the stent inflated to 14   atmospheres.  We positioned the balloon in the proximal aspect of the stent  and inflated it to 22 atmospheres.  We then went in with a 2.0 x 15-mm  Maverick balloon in the diagonal through the stent side struts and inflated  this to 7 and then 9 atmospheres.  Finally, we went in with a 3.0 x 15-mm  Quantum balloon and inflated this to 18 atmospheres in the proximal portion  of the stent and 8 atmospheres in the mid portion of the stent.  Final  angiographic images were obtained  revealing patency of the LAD with 0%  residual stenosis at the stent site and TIMI-3 flow into the distal vessel.  The diagonal branch also remained patent with residual 20% stenosis and TIMI-  3 flow.   Of note, during the procedure the patient developed severe hypotension and  then cardiogenic shock.  This occurred after we established reperfusion in  the left anterior descending artery.  We therefore placed an 8 French 40-mL  intraaortic balloon pump via the left femoral artery.  The patient also had  sustained ventricular tachycardia with stable blood pressure.  He was  treated with intravenous lidocaine which did successfully convert the  patient back to normal sinus rhythm.   At the conclusion of the procedure we had restored TIMI-3 flow as described  above.  The patient was hemodynamically stable.  It was necessary to start  the patient on intravenous dopamine and Levophed during the procedure for  severe hypotension. At the end of the procedure, we were able to wean the  Levophed off and decrease the dopamine to a very low dose.  The patient was  then transported the cardiac care unit in stable condition.   COMPLICATIONS:  None.   RESULTS:  1.  Successful percutaneous transluminal coronary angioplasty with placement      of a bare-metal stent in the proximal left anterior descending artery.      100% occlusion with TIMI-0 flow was reduced to 0% residual with TIMI-3      flow.  2.  Successful  percutaneous transluminal coronary angioplasty of the second      diagonal branch reducing 80% stenosis to 20% residual with TIMI-3 flow.   PLAN:  The sheath will be removed from the right femoral artery.  After  which, Angiomax will be resumed and continued while the intraaortic balloon  pump is left in place.  The patient will be continued on intraaortic balloon  support over the next 12-36 hours depending on his hemodynamics.  Swan-Ganz  catheter will be left in place for hemodynamic monitoring.  We will wean the  dopamine as tolerated by blood pressure.  We will leave the lidocaine  running over the next several hours and then discontinue this if he has no  further arrhythmias.   The patient will be started on Plavix which should be continued for a  minimum of one month.  We will also reassess his left ventricular function  and severity of mitral regurgitation with an echocardiogram.  If the mitral regurgitation is not severe enough to warrant surgery, then we would  anticipate staged percutaneous coronary intervention of the first obtuse  marginal branch in approximately  four weeks.      Mark   MWP/MEDQ  D:  09/16/2004  T:  09/16/2004  Job:  [id number]   cc:   Dr. Alroy Dust

## 2011-03-01 NOTE — Discharge Summary (Signed)
NAME:  [name], [name]            ACCOUNT NO.:  [id number]   MEDICAL RECORD NO.:  [id number]          PATIENT TYPE:  OIB   LOCATION:  [id number]                         FACILITY:  [facility]   PHYSICIAN:  Junious Silk, M.D. LHCDATE OF BIRTH:  Feb 04, 1932   DATE OF ADMISSION:  11/20/2004  DATE OF DISCHARGE:  11/21/2004                                 DISCHARGE SUMMARY   TENTATIVE DATE OF DISCHARGE:  November 21, 2004.   PROCEDURES:  1.  Cardiac catheterization.  2.  Placement of a Cypher stent to the OM1.   HISTORY OF PRESENT ILLNESS:  [name] is a 77 year old male with known  coronary artery disease.  He had an [place] stent to the proximal LAD and  PTCA of a diagonal branch in December for an acute anterior wall MI.  He had  cardiogenic shock and paroxysmal atrial fibrillation as well.  He was placed  on amiodarone and Coumadin, but then had a GI bleed and the Coumadin was  discontinued.  He has been continued on a proton pump inhibitor.   [name] was evaluated on November 19, 2004, for some chest pain and  left arm pain.  His films were reviewed and it was felt that an 80% stenosis  in an OM branch could be approached percutaneously.  He was admitted to the  hospital for percutaneous intervention on November 20, 2004.   HOSPITAL COURSE:  A relook catheterization was performed as well and it  showed the previous stent to the LAD and PTCA site in the diagonal were  widely patent with less than 20% in-stent restenosis.  The OM1 had an 80%  stenosis and this was treated with a Cypher stent, reducing the stenosis to  0.  [name] tolerated the procedure well.   Post procedure, [name] had some dysuria with decreased urinary flow.  He has no history of BPH, but is to follow up with his primary care  physician for this.  A urinalysis was ordered, but was negative for  leukocytes and nitrites.  A urine culture is pending at the time of  dictation.  He was seen by  cardiac rehabilitation and was ambulating without  chest pain or shortness of breath.  He was given instructions on exercise  guidelines and nitroglycerin use.   On November 21, 2004, [name] was having no chest pain or shortness of  breath.  His systolic blood pressure was approximately 100, but was stable.  He had some serous drainage on his Angio-Seal, but when the dressing was  removed there was no oozing; it had stopped.  His post procedure CK-MB was  64/4.5 with a troponin slightly elevated at 0.30.  A recheck on these  enzymes is pending.  If the enzymes are stable or trending down, he will be  discharged today.  If he has significant enzyme elevations, he will be held  overnight.  Of note, a BNP was checked and was slightly elevated at 206.  The lipid profile showed a total cholesterol of 167, triglycerides 78, HDL  39 and LDL 112.  He has been on  Lipitor 80 mg q.h.s.  Zetia 10mg  daily was  added.  He is to continue the Ticlid for six months and then Dr. Vicenta [name]  will decide whether to re challenge him with Plavix as he has a questionable  allergy to either penicillin or Plavix with a rash.   DISCHARGE DIAGNOSES:  1.  Unstable anginal pain, status post percutaneous transluminal coronary      angiography and Cypher stent to the first obtuse marginal this      admission.  2.  Acute anterior wall myocardial infarction in December of 2005 with Frederika stent to the proximal left anterior descending and percutaneous      transluminal coronary angiography to diagonal.  3.  Cardiogenic shock secondary to myocardial infarction.  4.  Paroxysmal atrial fibrillation.  5.  Gastrointestinal bleed with a hemoglobin of 5.9 and diffuse gastritis,      Coumadin discontinued.  6.  Mild anemia with a hemoglobin of 11.7 and a hematocrit of 34.3 post      catheterization.  7.  Hyperlipidemia.   DISCHARGE INSTRUCTIONS:  1.  His activity level is to include no strenuous activity for  three days.  2.  He is to stick to a diet that is low in salt and fat.  3.  He is to call the office for problems with the catheterization site.  4.  He is to see Dr. Vicenta [name] on December 10, 2004, at 11:30 a.m.  5.  He is to see Dr. Alroy [name] as scheduled.   DISCHARGE MEDICATIONS:  1.  Coated aspirin 325 mg daily.  2.  Ticlid 250 mg b.i.d.  3.  Sublingual nitroglycerin p.r.n.  4.  Nexium 40 mg daily.  5.  Lipitor 80 mg q.h.s.  6.  Amiodarone 200 mg daily.  7.  Lasix 20 mg daily.  8.  Potassium 20 mEq daily.  9.  Altace 2.5 mg daily.  10. Iron 325 mg t.i.d.  11. Zetia 10mg  daily.      RB/MEDQ  D:  11/21/2004  T:  11/21/2004  Job:  [id number]

## 2011-03-01 NOTE — Consult Note (Signed)
NAME:  [name], [name] NO.:  [id number]   MEDICAL RECORD NO.:  [id number]          PATIENT TYPE:  INP   LOCATION:  2929                         FACILITY:  [facility]   PHYSICIAN:  Judeth Cornfield. 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   HISTORY:  This is a pleasant 77 year old gentleman who was admitted on  September 15, 2004, with chest pain.  He underwent coronary arteriography via  a right femoral approach and also had an intra-aortic balloon pump placed on  the left side.  He has had an acute MI and did well with angioplasty, and  his intra-aortic balloon pump was removed, I believe, the following day.  He  has done well with no further chest pain since his angioplasty.  On  examination he was noted to have a bruit in the left groin, and this  prompted a duplex scan, which showed evidence of an AV fistula in the left  groin between the greater saphenous vein and the adjacent artery.  Vascular  surgery was consulted for further recommendations.   Prior to this admission this patient denies any history of claudication,  rest pain, or nonhealing ulcers.  He has had some paresthesias in his feet  for some time.  The etiology of this is not clear.   PAST MEDICAL HISTORY:  1.  Significant for his recent acute MI.  2.  Hypercholesterolemia.  3.  He denies any history of diabetes, hypertension, or history of      congestive heart failure.  4.  He does have a history of gastroesophageal reflux disease.  5.  Diverticulosis.   PAST SURGICAL HISTORY:  Significant for a hernia repair in the past and neck  and back surgery.   SOCIAL HISTORY:  He has a 40 pack-year history of smoking but now only  smokes a few cigarettes a day.   REVIEW OF SYSTEMS:  Documented on the attached medical history form.  Of  note, since  his angioplasty he has had no further chest pain.  He does admit  to some mild dyspnea on exertion.   PHYSICAL EXAMINATION:  VITAL SIGNS:  Blood pressure is 104/48, heart rate is  82.  NECK:  I do not detect any carotid bruits.  CHEST:  Lungs are clear bilaterally to auscultation.  CARDIAC:  He has a regular rate and rhythm.  ABDOMEN: Soft and nontender.  VASCULAR:  He has palpable femoral pulses.  He does have a left femoral  bruit.  He has palpable popliteal, dorsalis pedis, and posterior tibial  pulses bilaterally.  He has no significant swelling on the left.  He is not  tender in the left groin.   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  This currently is asymptomatic, and I would simply recommend a  follow-up duplex scan in four to six weeks, which I will arrange.  These will often  times resolve spontaneously.  I would only consider  elective repair if it were enlarging or became symptomatic.  I will be  available as needed during this admission and will arrange to see him in the  office in four to six weeks.      Chri   CSD/MEDQ  D:  09/18/2004  T:  09/19/2004  Job:  [id number]

## 2011-03-01 NOTE — Discharge Summary (Signed)
NAME:  ALWIN, HAILU NO.:  [id number]   MEDICAL RECORD NO.:  [id number]          PATIENT TYPE:  REC   LOCATION:  REHS                         FACILITY:  [facility]   PHYSICIAN:  Sharyl Nimrod, P.A. [facility] DATE OF BIRTH:  Mar 17, 1932   DATE OF ADMISSION:  10/06/2004  DATE OF DISCHARGE:                           DISCHARGE SUMMARY - REFERRING   Dictation canceled.       EW/MEDQ  D:  10/07/2004  T:  10/08/2004  Job:  [id number]

## 2011-03-01 NOTE — Assessment & Plan Note (Signed)
[facility]                         ELECTROPHYSIOLOGY OFFICE NOTE   NAME:Leavitt, IMARI WOLBECK                   MRN:          [id number]  DATE:09/23/2006                            DOB:          1932-04-30    HISTORY:  Mr. Uppal is seen today.  He complains of shortness of  breath with walking up inclines.  He is on amiodarone for paroxysmal  atrial fibrillation.  He has PFT scheduled for next month with Dr.  Shaune Pascal. Bensimhon.   MEDICATIONS:  1. Are notable for his Coreg dose of 12.5 mg.  2. Diovan 80 mg q. evening and 40 mg q. morning.   PHYSICAL EXAMINATION:  VITAL SIGNS:  Blood pressure today 104/62, pulse  50.  LUNGS:  Clear.  HEART:  Sounds were regular.  EXTREMITIES:  Without edema.   Interrogation of his [facility] device demonstrates a P-wave of  3, impedance of 460 and a threshold of 0.75 at 0.5, with an R-wave of 12  and an impedance of 435 and threshold of 0.5, battery voltage of 3.20.  Heart rate excursion very blunted with an atrial pacing of 86% with the  sensor off.   IMPRESSION:  1. Chronotropic incompetence.  2. Paroxysmal atrial fibrillation.  3. Medications contributing potentially to number one.  4. Ischemic heart disease with depressed left ventricular function and      narrow QRS.   PLAN:  1. We have reprogrammed Mr. Massucci ICD to activate rate response      and made it a little bit more sensitive.  2. I have asked him to increase his Coreg from 12.5 mg b.i.d. to 12.5      mg q.a.m. and 18.75 mg q.p.m.  3. He is to see Dr. Shaune Pascal. Bensimhon next month and further up-      titration may be possible at that time.     Deboraha Sprang, MD, [facility]  Electronically Signed    SCK/MedQ  DD: 09/23/2006  DT: 09/23/2006  Job #: [id number]   cc:   [name], MD

## 2011-03-01 NOTE — Op Note (Signed)
NAME:  [name], [name]            ACCOUNT NO.:  [id number]   MEDICAL RECORD NO.:  [id number]          PATIENT TYPE:  AMB   LOCATION:  ENDO                         FACILITY:  [facility]   PHYSICIAN:  Ronald Lobo, M.D.   DATE OF BIRTH:  29-Oct-1931   DATE OF PROCEDURE:  01/07/2005  DATE OF DISCHARGE:                                 OPERATIVE REPORT   PROCEDURE:  Colonoscopy.   ENDOSCOPIST:  Ronald Lobo, M.D.   INDICATION:  Transiently Hemoccult positive stool in a 78 year old who has  been on aspirin and Plavix.  Colonoscopy several years ago was negative   FINDINGS:  Moderate left-sided diverticulosis.   PROCEDURE:  The nature, purpose and risks of the procedure were familiar to  the patient who provided written consent. Sedation for this procedure and  the upper endoscopy which preceded it totalled fentanyl 50 mcg and Versed 8  mg IV without arrhythmias other than his baseline bradycardia in the high  40s, or desaturation. Systolic blood pressure got as low as 88, but the  patient appeared to be clinically stable throughout.   Digital exam of the prostate was unremarkable.   The Olympus adjustable tension pediatric video colonoscope was advanced  around the colon without too much difficulty, using some external abdominal  compression to control looping. The terminal ileum was entered for a short  distance and appeared normal.  Pullback was then performed. The quality of  the prep was excellent and it was felt that all areas were well seen.   This was a normal exam except for some left-sided diverticulosis. No polyps,  cancer, colitis or vascular malformations were seen. Retroflexion of the  rectum and reinspection of the rectum were unremarkable. No biopsies were  obtained. The patient tolerated the procedure well and there were apparent  complications.   IMPRESSION:  Transiently Hemoccult positive stool, without source evident on  current examination.   PLAN:  Consider  screening flexible sigmoidoscopy in 5 years.      RB/MEDQ  D:  01/07/2005  T:  01/07/2005  Job:  [id number]   cc:   Kirk Ruths, M.D. [facility]   L. Donnie Coffin, M.D.  [address]  [place] [address]  Fax: [phone]

## 2011-03-01 NOTE — Cardiovascular Report (Signed)
NAME:  ABDULHAKEEM, KENERSON            ACCOUNT NO.:  [id number]   MEDICAL RECORD NO.:  [id number]          PATIENT TYPE:  INP   LOCATION:  2929                         FACILITY:  [facility]   PHYSICIAN:  Junious Silk, M.D. LHCDATE OF BIRTH:  28-Nov-1931   DATE OF PROCEDURE:  09/18/2004  DATE OF DISCHARGE:                              CARDIAC CATHETERIZATION   PROCEDURE PERFORMED:  1.  Left heart catheterization.  2.  Selective coronary angiography of the left coronary arteries.   INDICATIONS FOR PROCEDURE:  Mr. Fryrear is a 77 year old man who  experienced extensive acute anterolateral wall myocardial infarction 2 1/2  days ago.  This was complicated by cardiogenic shock and congestive heart  failure.  This morning the patient developed atrial fibrillation with rapid  ventricular rate.  EKG showed a new significant ST segment elevation in the  anterolateral leads.  He is, therefore, brought back emergently for relook  catheterization.   PROCEDURE NOTE:  A 6 French sheath was placed in the right femoral artery.  Coronary angiography of the left coronary artery was performed using a 6  [place] JL4 catheter.  Left ventricular pressure was measured with an angled  pigtail catheter.  Contrast was Omnipaque.  There were no complications.   RESULTS:  Hemodynamics:  Left ventricular  pressure 104/30, aortic pressure 104/82,  there was no aortic gradient.   Left ventriculography was not performed.   Coronary angiography   Left main has a 20% stenosis.   Left anterior descending artery is widely patent at the stent site and the  proximal and mid LAD with 0% stenosis within the stent and TIMI III flow  into the distal vessel.  The LAD gives rise to a small first diagonal and  large second diagonal branch.  The second diagonal branch arises from within  the stented segment of the LAD.  There is a residual 30% stenosis in the  ostium of the second diagonal branch with TIMI III flow into this  vessel.   Left circumflex is unchanged from before with a 50% stenosis in the ostium  and an 80% stenosis in the proximal portion of a large first obtuse marginal  branch.   IMPRESSION:  1.  Elevated left ventricular end diastolic pressure.  2.  Patent stent in the left anterior descending artery and patent      angioplasty site of the second diagonal branch.  3.  No change in disease in the left circumflex as described.   PLAN:  The patient will be continued on current medical therapy.      Mark   MWP/MEDQ  D:  09/18/2004  T:  09/18/2004  Job:  [id number]   cc:   L. Donnie Coffin, M.D.  [address]  [place] [address]  Fax: [phone]

## 2011-03-01 NOTE — H&P (Signed)
NAME:  [name], [name]            ACCOUNT NO.:  [id number]   MEDICAL RECORD NO.:  [id number]          PATIENT TYPE:  INP   LOCATION:  [id number]                         FACILITY:  [facility]   PHYSICIAN:  Kirk Ruths, M.D. LHCDATE OF BIRTH:  [birth year]   DATE OF ADMISSION:  10/10/2004  DATE OF DISCHARGE:                                HISTORY & PHYSICAL   CHIEF COMPLAINT:  Anemia.   HISTORY OF PRESENT ILLNESS:  [name] is a 77 year old male with a  recent history of MI. He received a bare metal stent to his LAD on September 15, 2004.  He was placed on aspirin and Plavix at that time, but had a  reaction to Plavix and this was switched to Ticlid. He has paroxysmal atrial  fibrillation during his hospital stay as well as cardiogenic shock requiring  an intra-aortic balloon pump. He was placed on Coumadin for the paroxysmal  atrial fibrillation and discharged on September 27, 2004.   Since discharge [name], who felt weak at discharge, has not noticed  any improvement in his weakness. He may have even felt a little worse. He  states that prior to discharge he was having some black and tarry stools,  but these became worse after discharge. He contacted [facility] and was told to  do stool guaiac cards which he did and turned in on October 09, 2004; 6/6  were heme positive. [name] was seen in the office and had a CBC  drawn. His hemoglobin came back at 5.9 with a hematocrit of 17.2. He is  admitted for further evaluation and treatment.   [name] has had no chest pain since discharge. He had some dyspnea on  exertion, but no edema or other symptoms of volume overload. His dyspnea on  exertion has not significantly worsened recently.   PAST MEDICAL HISTORY:  1.  Cardiac catheterization, September 15, 2004, showing proximal LAD 100%      stenosed and treated with balloon angioplasty in bare metal stent      reducing the stenosis to 0.  PTCA of the second diagonal branch  reducing      that stenosis from 80% to 20% with TIMI-III flow. Circumflex with a 50%      stenosis  and OM-1 80% proximal stenosis.  RCA reduced from 30% to a 20%      stenosis.  2.  Left ventricular dysfunction with an EF of 42% at catheterization.  3.  Cardiogenic shock requiring intra-aortic balloon pump secondary to MI.  4.  Acute renal insufficiency, improved by discharge.  5.  Paroxysmal atrial fibrillation.  6.  Anticoagulation with Coumadin.  7.  Resolved urinary tract infections.  8.  Gastroesophageal reflux disease.  9.  Hyperlipidemia.  10. Diverticulosis.   PAST SURGICAL HISTORY:  Cardiac catheterization as well as back and neck  surgery and hernia repair.   SOCIAL HISTORY:  [name] is married. He has an approximately 50-pack  year history of tobacco, but has not smoked since his MI. He does not abuse  alcohol or drugs.   FAMILY HISTORY:  Negative for premature coronary  artery disease.   ALLERGIES:  PLAVIX and PENICILLIN.   CURRENT MEDICATIONS:  1.  Digoxin 0.125 mg daily.  2.  Nexium 40 mg daily.  3.  Lipitor 80 mg q.h.s.  4.  Aspirin 81 mg daily.  5.  Amiodarone 2 mg two tabs daily.  6.  Ticlid 250 mg b.i.d.  7.  Coumadin 5 mg as directed.   REVIEW OF SYSTEMS:  Significant for weakness and dyspnea on exertion as  described above.  He describes a feeling of his heart pounding in his  head, but states the rate is not extremely high and it is not irregular. He  gets this at times. He also states that he has some chest discomfort that is  relieved by belching. His reflux symptoms are well controlled on the Nexium,  but he describes melena. He has had no fevers or chills. He has had no  nausea or vomiting.   REVIEW OF SYSTEMS:  Otherwise negative.   PHYSICAL EXAMINATION:  VITAL SIGNS: Temperature is 100.1, blood pressure  106/52, heart rate 85, respiratory rate 20. O2 saturation 97% on room air.  GENERAL: He is a well-developed, elderly white male in no  acute distress.  SKIN: Pale, but warm and dry.  HEENT: Head is normocephalic and atraumatic with pupils equal, round, and  reactive to light and accommodation. Extraocular movements are intact.  Sclera clear.  NECK: There is no JVD or thyromegaly. Bilateral carotid bruits are  appreciated.  CHEST: Essentially clear to auscultation bilaterally.  CV: Regular rate and rhythm with a 2/6 systolic ejection murmur.  ABDOMEN: Soft and nontender with active bowel sounds.  EXTREMITIES: There is no clubbing, cyanosis, or edema. Distal pulses are 2+.  He has bilateral femoral bruits.  MUSCULOSKELETAL: There is no joint deformities or effusions. No spinal or  CVA tenderness.  NEUROLOGIC: He is alert and oriented. Cranial nerves II-XII grossly intact.   ASSESSMENT/PLAN:  Anemia: Will type, cross, and transfuse three units of  packed cells. Will give Lasix 20 mg IV after the first and third unit.  Recheck CBC two hours after the third unit. GI has been asked to see him. He  is a patient of Dr. Osborn Coho and has had an endoscopy and colonoscopy in  the past.       RB/MEDQ  D:  10/10/2004  T:  10/10/2004  Job:  [id number]

## 2011-03-01 NOTE — Op Note (Signed)
NAME:  [name], [name]            ACCOUNT NO.:  [id number]   MEDICAL RECORD NO.:  [id number]          PATIENT TYPE:  INP   LOCATION:  4740                         FACILITY:  [facility]   PHYSICIAN:  John C. Amedeo Plenty, M.D.    DATE OF BIRTH:  Dec 20, 1931   DATE OF PROCEDURE:  10/10/2004  DATE OF DISCHARGE:                                 OPERATIVE REPORT   PROCEDURE PERFORMED:  Esophagogastroduodenoscopy.   ENDOSCOPIST:  Missy Sabins, M.D.   INDICATIONS FOR PROCEDURE:  Anemia and heme positive stools.   DESCRIPTION OF PROCEDURE:  The patient was placed in the left lateral  decubitus position and placed on the pulse monitor with continuous low-flow  oxygen delivered by nasal cannula.  He was sedated with 50 mcg IV fentanyl  and 5 mg of IV Versed.  The Olympus video endoscope was advanced under  direct vision into the oropharynx and esophagus.  The esophagus was straight  and of normal caliber with the squamocolumnar line at 38 cm above a 2 cm  hiatal hernia.  There was possibly a very widely patent lower esophageal  ring that was only seen when the LES was completely relaxed.  There was no  visible esophagitis, Mallory-Weiss tear or other potential bleeding lesions  of the distal esophagus or gastroesophageal junction.  The stomach was  entered and a small amount of liquid secretions was suctioned from the  fundus.  A retroflex view of the cardia was unremarkable.  The fundus showed  some generalized erythema as did the body.  In the antrum there was some  adherent exudate which could not be adequately washed off covering a large  portion of the distal antrum.  There did not appear to be any sign of ulcers  although visualization of underlying mucosa was somewhat incomplete despite  vigorous water lavage.  It was felt that some of this may have been due to  the cetacaine spray but the exudate was more pronounced than you would  typically see with this.  To my knowledge, the patient had not  been given  any Carafate which could also cause this appearance.  Due to the high INR  and recent bleeding, I did not take a CLO test.  No definite bleeding source  was seen and there was no old blood in the stomach.  The pylorus appeared  somewhat fixed and slightly inflamed but relatively easily allowed passage  of the scope beyond it.  Both the bulb and second portion of the duodenum  were well seen and appeared to be within normal limits.  The scope was then  withdrawn and the patient returned to the recovery room in stable condition.  He tolerated the procedure well and there were no immediate complications.   IMPRESSION:  Diffuse gastritis most pronounced distally with no focal  bleeding source.   PLAN:  1.  Will check Helicobacter pylori antibody and treat for eradication of      Helicobacter if positive.  2.  Minimize anticoagulation to the extent allowable by his cardiac      condition.  3.  Transfuse as necessary.  4.  Continue proton pump inhibitor.       JCH/MEDQ  D:  10/11/2004  T:  10/11/2004  Job:  [id number]   cc:   Kirk Ruths, M.D. [facility]   L. Donnie Coffin, M.D.  [address]  [place] [address]  Fax: [phone]

## 2011-03-01 NOTE — Assessment & Plan Note (Signed)
[facility]                            CARDIOLOGY OFFICE NOTE   NAME:[name], [name]                   MRN:          [id number]  DATE:11/10/2006                            DOB:          23-Jun-1932    PRIMARY CARE PHYSICIAN:  Dr. Shon [name].   IDENTIFICATION:  [name] is a delightful 77 year old male who  returns for routine followup.   PROBLEM LIST:  1. Coronary artery disease.      a.     Status post anterior wall myocardial infarction in 2005       treated with PTCI and stenting of the LAD and second diagonal.      b.     Most recent catheterization February 2006 with PTCI and       stenting of the OM1 with a Cypher drug-eluting stent.  LAD and       diagonal stents were patent.      c.     Adenosine Myoview August 2006 EF 28%, large scar, no       ischemia.  2. Congestive heart failure secondary to ischemic cardiomyopathy.      a.     EF 20-30% by echocardiogram July 2007.      b.     Status post St. Jude ICD.  3. Paroxysmal atrial fibrillation on amiodarone but not taking      Coumadin secondary to history of GI bleed.  4. Chronic renal insufficiency, baseline creatinine about 1.7.  5. Hypertension.  6. Hyperlipidemia.   CURRENT MEDICATIONS:  1. Amiodarone 200 a day.  2. Aspirin 325.  3. Lasix 20.  4. Plavix 75.  5. Lipitor 80.  6. Zetia 10.  7. Iron 325 twice a day.  8. Nexium 40 b.i.d.  9. Diovan 40 in the morning, 80 at night.  10.Potassium 10 once a day.  11.Coreg 12.5 in the morning, 18.75 at night.  12.Multivitamin.   INTERVAL HISTORY:  [name] returns with his wife today for routine  followup.  He is doing quite well.  He said that he saw Dr. Caryl [name] a week  or so ago and his rate response was turned on and since that time he  feels like he has much more energy.  This morning, he had a 5 minute  episode of sort of crampy left-sided chest pain which came and went.  There were no associated symptoms.  It was  not related to exertion.  It  resolved on its own.  He did have a previous episode of vague chest pain  which was atypical while lying in bed several weeks ago.  It got worse  with positional movement and also resolved on its own.  He has been  riding a stationary bike without any chest pain, denies any heart  failure symptoms.  No bleeding.   PHYSICAL EXAMINATION:  On physical examination, he is well appearing, in  no acute distress, ambulates around the clinic without respiratory  difficulty.  Blood pressure is 122/64, heart rate 60, weight is 182.  HEENT:  Sclerae are anicteric, EOMI.  There are no xanthelasmas.  Mucous  membranes are moist, oropharynx is clear.  NECK:  Supple, no JVD.  Carotids are 2+ bilaterally without any bruits.  There is no lymphadenopathy or thyromegaly.  CARDIAC:  Very distant heart sounds, regular rate and rhythm with no  obvious murmurs, rubs or gallops.  LUNGS:  Clear.  ABDOMEN:  Soft, nontender, nondistended.  There is no  hepatosplenomegaly, no bruits, no mass.  EXTREMITIES:  Warm, with no cyanosis or edema, perhaps mild clubbing.  NEURO:  Alert and oriented x3.  Cranial nerves II-XII intact.  Moves all  4 extremities without difficulty.   EKG shows atrial pacing at a rate of 60 with a right axis deviation.  There are previous anterior high-lateral infarcts which are old.  He has  P-wave inversions, V3 through V6 which are new since October of 2007.   ASSESSMENT AND PLAN:  1. Chest pain.  His symptoms are quite atypical and I do not think      they are ischemic in nature, however as there has been a change in      his EKG over the past few months, thus we have decided to proceed      with an Adenosine Myoview to further evaluate.  I have started him      on Imdur 30 a day.  2. Heart failure.  He is very well compensated.  I put him in an NYHA      class II.  We will go ahead and continue to titrate his Coreg to      18.75 b.i.d.  3. Hypertension.   Well controlled.  4. Hyperlipidemia.  At the last check, his LDL was just above goal.      We will recheck it with is stress test.  5. Atrial fibrillation.  Maintaining sinus nicely on amiodarone.  We      will check routine surveillance labs for his thyroid.  He also      recently had PFTs and we will follow up on his diffusion capacity.   DISPOSITION:  Return to clinic in 6 weeks for followup.  I told him  should he have recurrent worsening chest pain he needs to go to  emergency room or call 911.     [name] Pascal. Bensimhon, MD  Electronically Signed   DRB/MedQ  DD: 11/10/2006  DT: 11/10/2006  Job #: [id number]   cc:   [name] Reel, MD

## 2011-03-04 ENCOUNTER — Ambulatory Visit (INDEPENDENT_AMBULATORY_CARE_PROVIDER_SITE_OTHER): Payer: Medicare Other | Admitting: Internal Medicine

## 2011-03-04 ENCOUNTER — Encounter: Payer: Self-pay | Admitting: Internal Medicine

## 2011-03-04 VITALS — BP 110/62 | HR 68 | Resp 18 | Ht 71.0 in | Wt 179.8 lb

## 2011-03-04 DIAGNOSIS — I2510 Atherosclerotic heart disease of native coronary artery without angina pectoris: Secondary | ICD-10-CM

## 2011-03-04 DIAGNOSIS — I509 Heart failure, unspecified: Secondary | ICD-10-CM

## 2011-03-04 DIAGNOSIS — I5022 Chronic systolic (congestive) heart failure: Secondary | ICD-10-CM

## 2011-03-04 MED ORDER — DIGOXIN 125 MCG PO TABS: ORAL_TABLET | ORAL | Status: DC

## 2011-03-04 NOTE — Assessment & Plan Note (Signed)
Seems to be a bit better. NYHA III. Volume status looks good. No evidence of progressive end-organ dysfunction. We discussed options of home inotropes and/or LVAD. However, we both feel he is doing fairly well. Will continue to follow closely. If symptoms get worse will need RHC.

## 2011-03-04 NOTE — Assessment & Plan Note (Signed)
No evidence of ischemia. Continue current regimen.   

## 2011-03-04 NOTE — Assessment & Plan Note (Signed)
Seems to be a bit better. NYHA III. Volume status looks good. We discussed options of home inotropes and/or LVAD. However, we both feel he is doing fairly well. Will continue to follow closely. If symptoms get worse will need RHC.

## 2011-03-04 NOTE — Patient Instructions (Signed)
Your physician recommends that you schedule a follow-up appointment in: 2 months  

## 2011-03-04 NOTE — Progress Notes (Signed)
HPI:  [name] is delightful 77 year old male with a history of coronary artery disease, status post previous large anterior wall myocardial infarction.  This has been complicated by congestive heart failure.  Ejection fraction of 25%.  He is status post placement of a St. Jude BiV ICD.  He also has had multivessel stenting in the past. Remainder of his medical history is notable for atrial fibrillation,maintaining sinus rhythm on amiodarone.  He is not on Coumadin secondary to GI bleed, chronic renal insufficiency with baseline creatinine about 1.5-2.0, hypertension, hyperlipidemia, and a systemic tremor.  We saw him a few weeks ago with NYHA III-IIIB symptoms. We discussed possibility of RHC and inotropes but he wanted to defer.  We decreased coreg and had labs. Also underwent AV optimization. Labs stable/  He returns today with his wife for routine f/u.   Says he feels OK. Feels a bit better No CP. Does get some dyspnea but it is mostly fatigue. No edema, orthopnea or PND. Can walk a few hundred yards and then has to stop. No CP. Able to work in garden.   ROS: All systems negative except as listed in HPI, PMH and Problem List.  Past Medical History  Diagnosis Date  . CAD (coronary artery disease)      a. s/p anterior MI 12/05 c/b shock -> stent LAD   b. s/p stenting OM-1, 2/06  . CHF (congestive heart failure)     due to ischemic CM  a. EF 20-30%. (Nov 2008)   b. s/p St. Jude BiV-ICD    c. CPX 07/2008  pvo2 16.3 (63% predicted) slope 34 RER 1.08 O2 pulse 93%  . Atrial fibrillation or flutter     maintaining sinus on amiodarone  --PFTS, TFTs ok 8/10  . CRI (chronic renal insufficiency)     (baseline 2.0-2.2)  . HTN (hypertension)   . Hyperlipidemia   . GERD (gastroesophageal reflux disease)     Current Outpatient Prescriptions  Medication Sig Dispense Refill  . aspirin 325 MG EC tablet Take 325 mg by mouth daily.        . carvedilol (COREG) 6.25 MG tablet Take 2 tablets by mouth  Twice daily.      . CRESTOR 20 MG tablet Take 1 tablet by mouth daily.      . digoxin (LANOXIN) 0.125 MG tablet 1/2 tab once daily      . ferrous sulfate ([employer] IRON) 325 (65 FE) MG tablet 3 times a week       . furosemide (LASIX) 20 MG tablet Take 1 tablet by mouth daily.      Marland Kitchen KLOR-CON M10 10 MEQ tablet Take 1 tablet by mouth daily.      Marland Kitchen levothyroxine (SYNTHROID, LEVOTHROID) 75 MCG tablet Take 1 tablet by mouth daily.      . Multiple Vitamins-Minerals (CENTRUM SILVER PO) Take 1 tablet by mouth daily.        . nitroGLYCERIN (NITROSTAT) 0.4 MG SL tablet Place 0.4 mg under the tongue as directed.        Marland Kitchen PACERONE 200 MG tablet Take 1/2 tablet by mouth once daily      . pantoprazole (PROTONIX) 40 MG tablet Take 1 tablet by mouth daily.      Marland Kitchen PLAVIX 75 MG tablet Take 1 tablet by mouth daily.      . valsartan (DIOVAN) 80 MG tablet Take 1 tablet (80 mg total) by mouth daily.  90 tablet  3  . ZETIA 10 MG tablet Take  1 tablet by mouth daily.         PHYSICAL EXAM: Filed Vitals:   03/04/11 1343  BP: 110/62  Pulse: 68  Resp: 18    General:  Mildly fatigued appearing. no resp difficulty HEENT: normal Neck: supple. JVP flat Carotids 2+ bilat; no bruits. Cor: PMI laterally displaced. Regular rate & rhythm. No rubs, gallops, 2/6 SEM murmur at apex Lungs: clear with decreased air movement (mild) Abdomen: soft, nontender, nondistended. No hepatosplenomegaly. No bruits or masses. Good bowel sounds. Extremities: no cyanosis, clubbing, rash, edema Neuro: alert & orientedx3, cranial nerves grossly intact. moves all 4 extremities w/o difficulty. affect pleasant device pocket somewhat retracted but skin moves easily acroos the surface   ECG: AV pacing 60   ASSESSMENT & PLAN:

## 2011-03-08 DIAGNOSIS — M1990 Unspecified osteoarthritis, unspecified site: Secondary | ICD-10-CM | POA: Insufficient documentation

## 2011-03-08 HISTORY — DX: Unspecified osteoarthritis, unspecified site: M19.90

## 2011-05-02 ENCOUNTER — Encounter: Payer: Medicare Other | Admitting: *Deleted

## 2011-05-13 ENCOUNTER — Encounter: Payer: Self-pay | Admitting: Internal Medicine

## 2011-05-13 ENCOUNTER — Ambulatory Visit (INDEPENDENT_AMBULATORY_CARE_PROVIDER_SITE_OTHER): Payer: Medicare Other | Admitting: Internal Medicine

## 2011-05-13 VITALS — BP 108/79 | HR 60 | Resp 14 | Ht 71.0 in | Wt 179.0 lb

## 2011-05-13 DIAGNOSIS — I4891 Unspecified atrial fibrillation: Secondary | ICD-10-CM

## 2011-05-13 DIAGNOSIS — I2510 Atherosclerotic heart disease of native coronary artery without angina pectoris: Secondary | ICD-10-CM

## 2011-05-13 DIAGNOSIS — I5022 Chronic systolic (congestive) heart failure: Secondary | ICD-10-CM

## 2011-05-13 NOTE — Progress Notes (Signed)
HPI:  [name] is delightful 77 year old male with a history of coronary artery disease, status post previous large anterior wall myocardial infarction.  This has been complicated by congestive heart failure.  Ejection fraction of 25%.  He is status post placement of a St. Jude BiV ICD.  He also has had multivessel stenting in the past. Remainder of his medical history is notable for atrial fibrillation,maintaining sinus rhythm on amiodarone.  He is not on Coumadin secondary to GI bleed, chronic renal insufficiency with baseline creatinine about 1.5-2.0, hypertension, hyperlipidemia, and a systemic tremor.  He returns today with his wife for routine f/u.   At last visit cut carvedilol back due to fatigue. Says he feels much better - energy improved. Takes BP a few times a week and SBP ~110.No CP. Able to run tiller in garden and mows grass with riding mower. Takes frequent breaks. No edema, orthopnea or PND. Compliant with meds. Weight very stable. Dr. Virgina Jock following lipids.   ROS: All systems negative except as listed in HPI, PMH and Problem List.  Past Medical History  Diagnosis Date  . CAD (coronary artery disease)      a. s/p anterior MI 12/05 c/b shock -> stent LAD   b. s/p stenting OM-1, 2/06  . CHF (congestive heart failure)     due to ischemic CM  a. EF 20-30%. (Nov 2008)   b. s/p St. Jude BiV-ICD    c. CPX 07/2008  pvo2 16.3 (63% predicted) slope 34 RER 1.08 O2 pulse 93%  . Atrial fibrillation or flutter     maintaining sinus on amiodarone  --PFTS, TFTs ok 8/10  . CRI (chronic renal insufficiency)     (baseline 2.0-2.2)  . HTN (hypertension)   . Hyperlipidemia   . GERD (gastroesophageal reflux disease)     Current Outpatient Prescriptions  Medication Sig Dispense Refill  . aspirin 325 MG EC tablet Take 325 mg by mouth daily.        . carvedilol (COREG) 6.25 MG tablet Take 2 tablets by mouth Twice daily.      . CRESTOR 20 MG tablet Take 1 tablet by mouth daily.      . digoxin  (LANOXIN) 0.125 MG tablet 1/2 tab once daily  30 tablet  6  . ferrous sulfate ([employer] IRON) 325 (65 FE) MG tablet 3 times a week       . furosemide (LASIX) 20 MG tablet Take 1 tablet by mouth daily.      Marland Kitchen KLOR-CON M10 10 MEQ tablet Take 1 tablet by mouth daily.      Marland Kitchen levothyroxine (SYNTHROID, LEVOTHROID) 50 MCG tablet Take 50 mcg by mouth daily.        . Multiple Vitamins-Minerals (CENTRUM SILVER PO) Take 1 tablet by mouth daily.        . nitroGLYCERIN (NITROSTAT) 0.4 MG SL tablet Place 0.4 mg under the tongue as directed.        Marland Kitchen PACERONE 200 MG tablet Take 1/2 tablet by mouth once daily      . pantoprazole (PROTONIX) 40 MG tablet Take 1 tablet by mouth daily.      Marland Kitchen PLAVIX 75 MG tablet Take 1 tablet by mouth daily.      . valsartan (DIOVAN) 80 MG tablet Take 1 tablet (80 mg total) by mouth daily.  90 tablet  3  . ZETIA 10 MG tablet Take 1 tablet by mouth daily.         PHYSICAL EXAM: Filed Vitals:  05/13/11 1209  BP: 108/79  Pulse: 60  Resp: 14    General:  Well appearing. no resp difficulty HEENT: normal Neck: supple. JVP flat Carotids 2+ bilat; no bruits. Cor: PMI laterally displaced. Regular rate & rhythm. No rubs, gallops, 2/6 SEM murmur at apex Lungs: clear with decreased air movement (mild) Abdomen: soft, nontender, nondistended. No hepatosplenomegaly. No bruits or masses. Good bowel sounds. Extremities: no cyanosis, clubbing, rash, edema Neuro: alert & orientedx3, cranial nerves grossly intact. moves all 4 extremities w/o difficulty. affect pleasant    ECG: Sinus with V pacing 60   ASSESSMENT & PLAN:

## 2011-05-14 ENCOUNTER — Encounter: Payer: Self-pay | Admitting: Internal Medicine

## 2011-05-16 ENCOUNTER — Other Ambulatory Visit: Payer: Self-pay | Admitting: Internal Medicine

## 2011-05-16 ENCOUNTER — Encounter: Payer: Self-pay | Admitting: Internal Medicine

## 2011-05-16 ENCOUNTER — Ambulatory Visit (INDEPENDENT_AMBULATORY_CARE_PROVIDER_SITE_OTHER): Payer: Medicare Other | Admitting: *Deleted

## 2011-05-16 DIAGNOSIS — I428 Other cardiomyopathies: Secondary | ICD-10-CM

## 2011-05-16 DIAGNOSIS — I4891 Unspecified atrial fibrillation: Secondary | ICD-10-CM

## 2011-05-16 DIAGNOSIS — Z95810 Presence of automatic (implantable) cardiac defibrillator: Secondary | ICD-10-CM

## 2011-05-16 LAB — REMOTE ICD DEVICE
AL AMPLITUDE: 4.8 mv
ATRIAL PACING ICD: 97 pct
BAMS-0003: 70 {beats}/min
BATTERY VOLTAGE: 2.6 V
DEVICE MODEL ICD: 528626
HV IMPEDENCE: 52 Ohm
MODE SWITCH EPISODES: 3
TZAT-0001SLOWVT: 1
TZAT-0004SLOWVT: 8
TZAT-0012FASTVT: 200 ms
TZAT-0012SLOWVT: 200 ms
TZAT-0013FASTVT: 1
TZAT-0013SLOWVT: 3
TZAT-0018SLOWVT: NEGATIVE
TZAT-0019FASTVT: 7.5 V
TZAT-0019SLOWVT: 7.5 V
TZAT-0020FASTVT: 1 ms
TZAT-0020SLOWVT: 1 ms
TZON-0003SLOWVT: 350 ms
TZON-0004FASTVT: 12
TZON-0004SLOWVT: 12
TZON-0005FASTVT: 6
TZON-0005SLOWVT: 6
TZST-0001FASTVT: 2
TZST-0001FASTVT: 4
TZST-0001SLOWVT: 3
TZST-0003FASTVT: 36 J
TZST-0003FASTVT: 36 J
TZST-0003SLOWVT: 25 J
TZST-0003SLOWVT: 30 J

## 2011-05-16 NOTE — Assessment & Plan Note (Signed)
Maintaining SR on low dose amio. Recent surveillance was ok. Not coumadin candidate due to h/o GIB. Continue ASA.

## 2011-05-16 NOTE — Assessment & Plan Note (Signed)
Doing well. NYHA II-III. Volume status looks good. Med titration limited by BP and fatigue. Continue current regimen.

## 2011-05-16 NOTE — Assessment & Plan Note (Signed)
No evidence of ischemia. Continue current regimen.   

## 2011-05-22 NOTE — Progress Notes (Signed)
icd remote check  

## 2011-06-05 ENCOUNTER — Encounter: Payer: Self-pay | Admitting: *Deleted

## 2011-06-28 ENCOUNTER — Other Ambulatory Visit: Payer: Self-pay | Admitting: Internal Medicine

## 2011-06-28 DIAGNOSIS — T82198A Other mechanical complication of other cardiac electronic device, initial encounter: Secondary | ICD-10-CM

## 2011-06-28 NOTE — Telephone Encounter (Signed)
Checking lead

## 2011-07-03 ENCOUNTER — Ambulatory Visit (INDEPENDENT_AMBULATORY_CARE_PROVIDER_SITE_OTHER)
Admission: RE | Admit: 2011-07-03 | Discharge: 2011-07-03 | Disposition: A | Discharge status: Home / Self Care | Payer: Medicare Other | Source: Ambulatory Visit | Attending: Internal Medicine | Admitting: Internal Medicine

## 2011-07-03 ENCOUNTER — Telehealth (HOSPITAL_COMMUNITY): Payer: Self-pay | Admitting: *Deleted

## 2011-07-03 DIAGNOSIS — T82198A Other mechanical complication of other cardiac electronic device, initial encounter: Secondary | ICD-10-CM

## 2011-07-03 NOTE — Telephone Encounter (Signed)
Ms. Fluegel called this afternoon, she picked up her husbands carvedilol and the dosage was different from what he had been taking and wanted to know which one he should take.

## 2011-07-04 MED ORDER — CARVEDILOL 6.25 MG PO TABS: 6.2500 mg | ORAL_TABLET | Freq: Two times a day (BID) | ORAL | Status: DC

## 2011-07-04 NOTE — Telephone Encounter (Signed)
Spoke w/pt's wife and clarified med

## 2011-07-08 ENCOUNTER — Telehealth: Payer: Self-pay | Admitting: Internal Medicine

## 2011-07-08 NOTE — Telephone Encounter (Signed)
Pt was wondering what was going on with results of chest x-ray. They wanted results prior to going out of town

## 2011-07-09 LAB — BASIC METABOLIC PANEL
BUN: 21
CO2: 28
Chloride: 105
Chloride: 105
GFR calc Af Amer: 49 — ABNORMAL LOW
GFR calc Af Amer: 52 — ABNORMAL LOW
Potassium: 4
Potassium: 4.2
Sodium: 139
Sodium: 140

## 2011-07-09 LAB — CBC
HCT: 35.7 — ABNORMAL LOW
Hemoglobin: 12.8 — ABNORMAL LOW
MCHC: 35.8
MCV: 92.1
RBC: 3.87 — ABNORMAL LOW
WBC: 8.7

## 2011-07-11 ENCOUNTER — Telehealth: Payer: Self-pay | Admitting: *Deleted

## 2011-07-11 NOTE — Telephone Encounter (Signed)
Notified. 

## 2011-07-15 NOTE — Telephone Encounter (Signed)
Pt's wife was notified of results

## 2011-07-19 LAB — I-STAT 8, (EC8 V) (CONVERTED LAB)
BUN: 29 — ABNORMAL HIGH
Chloride: 109
HCT: 41
Hemoglobin: 13.9
Operator id: 282201
Potassium: 4.3
pCO2, Ven: 45.1

## 2011-07-19 LAB — DIFFERENTIAL
Basophils Relative: 0
Eosinophils Absolute: 0.4
Eosinophils Relative: 4
Neutrophils Relative %: 80 — ABNORMAL HIGH

## 2011-07-19 LAB — CBC
HCT: 37.9 — ABNORMAL LOW
MCHC: 34.1
MCV: 92.3
Platelets: 219

## 2011-07-19 LAB — DIGOXIN LEVEL: Digoxin Level: 0.8

## 2011-07-19 LAB — POCT I-STAT CREATININE
Creatinine, Ser: 1.9 — ABNORMAL HIGH
Operator id: 282201

## 2011-07-19 LAB — POCT CARDIAC MARKERS
Operator id: 282201
Troponin i, poc: <0.05

## 2011-07-19 LAB — B-NATRIURETIC PEPTIDE (CONVERTED LAB): Pro B Natriuretic peptide (BNP): 474 — ABNORMAL HIGH

## 2011-07-23 ENCOUNTER — Ambulatory Visit (INDEPENDENT_AMBULATORY_CARE_PROVIDER_SITE_OTHER): Payer: Medicare Other | Admitting: Internal Medicine

## 2011-07-23 ENCOUNTER — Encounter: Payer: Self-pay | Admitting: Internal Medicine

## 2011-07-23 DIAGNOSIS — R5383 Other fatigue: Secondary | ICD-10-CM

## 2011-07-23 DIAGNOSIS — I5022 Chronic systolic (congestive) heart failure: Secondary | ICD-10-CM

## 2011-07-23 DIAGNOSIS — I2589 Other forms of chronic ischemic heart disease: Secondary | ICD-10-CM

## 2011-07-23 DIAGNOSIS — R5381 Other malaise: Secondary | ICD-10-CM

## 2011-07-23 LAB — ICD DEVICE OBSERVATION
AL IMPEDENCE ICD: 500 Ohm
ATRIAL PACING ICD: 97 pct
BAMS-0001: 170 {beats}/min
BAMS-0003: 70 {beats}/min
HV IMPEDENCE: 53 Ohm
LV LEAD IMPEDENCE ICD: 800 Ohm
LV LEAD THRESHOLD: 1.25 V
RV LEAD AMPLITUDE: 12 mv
RV LEAD IMPEDENCE ICD: 437.5 Ohm
TOT-0006: 20090417000000
TOT-0007: 3
TOT-0008: 0
TOT-0009: 1
TZAT-0004FASTVT: 8
TZAT-0004SLOWVT: 8
TZAT-0012FASTVT: 200 ms
TZAT-0018FASTVT: NEGATIVE
TZAT-0019FASTVT: 7.5 V
TZON-0003FASTVT: 285 ms
TZON-0004FASTVT: 12
TZON-0010FASTVT: 80 ms
TZON-0010SLOWVT: 80 ms
TZST-0001FASTVT: 3
TZST-0001SLOWVT: 2
TZST-0001SLOWVT: 5
TZST-0003FASTVT: 36 J
TZST-0003SLOWVT: 25 J
TZST-0003SLOWVT: 36 J
VENTRICULAR PACING ICD: 99.95 pct

## 2011-07-23 LAB — HEPATIC FUNCTION PANEL
ALT: 15 U/L (ref 0–53)
AST: 23 U/L (ref 0–37)
Albumin: 4 g/dL (ref 3.5–5.2)
Alkaline Phosphatase: 62 U/L (ref 39–117)
Total Protein: 7.4 g/dL (ref 6.0–8.3)

## 2011-07-23 LAB — BASIC METABOLIC PANEL
BUN: 24 mg/dL — ABNORMAL HIGH (ref 6–23)
Chloride: 105 mEq/L (ref 96–112)
Creatinine, Ser: 1.7 mg/dL — ABNORMAL HIGH (ref 0.4–1.5)

## 2011-07-23 LAB — TSH: TSH: 3.99 u[IU]/mL (ref 0.35–5.50)

## 2011-07-23 NOTE — Progress Notes (Signed)
HPI  [name] is a 77 y.o. male Seen in followup for CRT-D implanted for congestive heart failure in the setting of ischemic heart disease. He is status post previous large anterior wall myocardial infarction. Ejection fraction of 25%.  He also has had multivessel stenting in the past.   Remainder of his medical history is notable for atrial fibrillation,maintaining sinus rhythm on amiodarone. He is not on Coumadin secondary to GI bleed, chronic renal insufficiency with baseline creatinine about 1.5-2.0,   The patient denies chest pain edema or palpitations.  There has been no syncope or presyncope. He notes fatigue in his legs getting tired.  His other concern is getting over his defibrillator site. The skin continues to move easily.    Past Medical History  Diagnosis Date  . CAD (coronary artery disease)      a. s/p anterior MI 12/05 c/b shock -> stent LAD   b. s/p stenting OM-1, 2/06  . CHF (congestive heart failure)     due to ischemic CM  a. EF 20-30%. (Nov 2008)   b. s/p St. Jude BiV-ICD    c. CPX 07/2008  pvo2 16.3 (63% predicted) slope 34 RER 1.08 O2 pulse 93%  . Atrial fibrillation or flutter     maintaining sinus on amiodarone  --PFTS, TFTs ok 8/10  . CRI (chronic renal insufficiency)     (baseline 2.0-2.2)  . HTN (hypertension)   . Hyperlipidemia   . GERD (gastroesophageal reflux disease)     Past Surgical History  Procedure Date  . Hernia repair 1989    bilateral  . Cervical laminectomy 1985  . Back surgery 1972    lower back  . Cardiac defibrillator placement     ICD St Jude    Current Outpatient Prescriptions  Medication Sig Dispense Refill  . aspirin 325 MG EC tablet Take 325 mg by mouth daily.        . carvedilol (COREG) 6.25 MG tablet Take 1 tablet (6.25 mg total) by mouth 2 (two) times daily with a meal.  120 tablet  6  . CRESTOR 20 MG tablet Take 1 tablet by mouth daily.      . digoxin (LANOXIN) 0.125 MG tablet 1/2 tab once daily  30 tablet   6  . ferrous sulfate ([employer] IRON) 325 (65 FE) MG tablet 3 times a week       . furosemide (LASIX) 20 MG tablet Take 1 tablet by mouth daily.      Marland Kitchen KLOR-CON M10 10 MEQ tablet Take 1 tablet by mouth daily.      Marland Kitchen levothyroxine (SYNTHROID, LEVOTHROID) 50 MCG tablet Take 50 mcg by mouth daily.        . Multiple Vitamins-Minerals (CENTRUM SILVER PO) Take 1 tablet by mouth daily.        . nitroGLYCERIN (NITROSTAT) 0.4 MG SL tablet Place 0.4 mg under the tongue as directed.        Marland Kitchen PACERONE 200 MG tablet Take 1/2 tablet by mouth once daily      . pantoprazole (PROTONIX) 40 MG tablet Take 1 tablet by mouth daily.      Marland Kitchen PLAVIX 75 MG tablet Take 1 tablet by mouth daily.      . valsartan (DIOVAN) 80 MG tablet Take 1 tablet (80 mg total) by mouth daily.  90 tablet  3  . ZETIA 10 MG tablet Take 1 tablet by mouth daily.        Allergies  Allergen Reactions  .  Amoxicillin     REACTION: rash  . Penicillins   . Ramipril     REACTION: cough    Review of Systems negative except from HPI and PMH  Physical Exam Well developed and well nourished in no acute distress HENT normal E scleral and icterus clear Neck Supple JVP greater than 10 cm; carotids brisk and full Defibrillator pocket is well-healed there is significant atrophy. There is some retraction of the skin but there is no tethering. Clear to ausculation Regular rate and rhythm, no murmurs gallops or rub Soft with active bowel sounds No clubbing cyanosis and edema Alert and oriented, grossly normal motor and sensory function Skin Warm and Dry   Assessment and  Plan

## 2011-07-23 NOTE — Assessment & Plan Note (Signed)
Stable. We'll continue him on his current medications

## 2011-07-23 NOTE — Assessment & Plan Note (Signed)
Stable. I'm impressed by his volume status but gases of edema. He will be seeing Dr. Reine Just later this month. It may well be a role for up titration  We'll look at his BUN and creatinine to about doing this in anticipation of him seeing Dr. Reine Just

## 2011-07-23 NOTE — Patient Instructions (Signed)
Your physician wants you to follow-up in: 1 year You will receive a reminder letter in the mail two months in advance. If you don't receive a letter, please call our office to schedule the follow-up appointment.   Your physician recommends that you return for lab work today 07/23/2011 (TSH, LIVER, Digoxin)

## 2011-07-23 NOTE — Assessment & Plan Note (Signed)
Maintaining sinus rhythm is identified by his device. Atrial fibrillation burden is less than 1%. We will check his amiodarone surveillance labs with his teenage last checked in April. We will also check a digoxin level looking for drug interactions

## 2011-07-23 NOTE — Assessment & Plan Note (Signed)
The patient's device was interrogated.  The information was reviewed. No changes were made in the programming.    

## 2011-08-12 ENCOUNTER — Telehealth (HOSPITAL_COMMUNITY): Payer: Self-pay | Admitting: *Deleted

## 2011-08-12 ENCOUNTER — Ambulatory Visit (HOSPITAL_COMMUNITY)
Admission: RE | Admit: 2011-08-12 | Discharge: 2011-08-12 | Disposition: A | Discharge status: Home / Self Care | Payer: Medicare Other | Source: Ambulatory Visit | Attending: Internal Medicine | Admitting: Internal Medicine

## 2011-08-12 DIAGNOSIS — I252 Old myocardial infarction: Secondary | ICD-10-CM | POA: Insufficient documentation

## 2011-08-12 DIAGNOSIS — I2510 Atherosclerotic heart disease of native coronary artery without angina pectoris: Secondary | ICD-10-CM

## 2011-08-12 DIAGNOSIS — Z79899 Other long term (current) drug therapy: Secondary | ICD-10-CM | POA: Insufficient documentation

## 2011-08-12 DIAGNOSIS — I10 Essential (primary) hypertension: Secondary | ICD-10-CM

## 2011-08-12 DIAGNOSIS — Z95810 Presence of automatic (implantable) cardiac defibrillator: Secondary | ICD-10-CM | POA: Insufficient documentation

## 2011-08-12 DIAGNOSIS — I5022 Chronic systolic (congestive) heart failure: Secondary | ICD-10-CM

## 2011-08-12 DIAGNOSIS — E785 Hyperlipidemia, unspecified: Secondary | ICD-10-CM | POA: Insufficient documentation

## 2011-08-12 DIAGNOSIS — N189 Chronic kidney disease, unspecified: Secondary | ICD-10-CM | POA: Insufficient documentation

## 2011-08-12 DIAGNOSIS — I129 Hypertensive chronic kidney disease with stage 1 through stage 4 chronic kidney disease, or unspecified chronic kidney disease: Secondary | ICD-10-CM | POA: Insufficient documentation

## 2011-08-12 DIAGNOSIS — I509 Heart failure, unspecified: Secondary | ICD-10-CM | POA: Insufficient documentation

## 2011-08-12 DIAGNOSIS — I4891 Unspecified atrial fibrillation: Secondary | ICD-10-CM | POA: Insufficient documentation

## 2011-08-12 DIAGNOSIS — K219 Gastro-esophageal reflux disease without esophagitis: Secondary | ICD-10-CM | POA: Insufficient documentation

## 2011-08-12 MED ORDER — ROSUVASTATIN CALCIUM 20 MG PO TABS: 20.0000 mg | ORAL_TABLET | Freq: Every day | ORAL | Status: DC

## 2011-08-12 MED ORDER — VALSARTAN 80 MG PO TABS: 80.0000 mg | ORAL_TABLET | Freq: Every day | ORAL | Status: DC

## 2011-08-12 MED ORDER — CLOPIDOGREL BISULFATE 75 MG PO TABS: 75.0000 mg | ORAL_TABLET | Freq: Every day | ORAL | Status: DC

## 2011-08-12 MED ORDER — EZETIMIBE 10 MG PO TABS: 10.0000 mg | ORAL_TABLET | Freq: Every day | ORAL | Status: DC

## 2011-08-12 NOTE — Assessment & Plan Note (Addendum)
Doing well. NYHA II . Volume status looks good.  Continue current regimen. He is instructed to take Diovan 40 mg bid instead of 80mg  in the am.  Follow up in 6 months.   Patient seen and examined with Darrick Grinder, NP. We discussed all aspects of the encounter. I agree with the assessment and plan as stated above.

## 2011-08-12 NOTE — Progress Notes (Signed)
Patient seen and examined with Amy Clegg, NP. We discussed all aspects of the encounter. I agree with the assessment and plan as stated above.   

## 2011-08-12 NOTE — Telephone Encounter (Signed)
Ms Lewey called this afternoon to find out when her husband will need to come back.  She said that on her paper it says 3 months, but Dr Haroldine Laws told the 6 months. Thanks!

## 2011-08-12 NOTE — Progress Notes (Signed)
HPI:  [name] is delightful 77 year old male with a history of coronary artery disease, status post previous large anterior wall myocardial infarction.  This has been complicated by congestive heart failure.  Ejection fraction of 25%.  He is status post placement of a St. Jude BiV ICD.  He also has had multivessel stenting in the past. Remainder of his medical history is notable for atrial fibrillation,maintaining sinus rhythm on amiodarone.  He is not on Coumadin secondary to GI bleed, chronic renal insufficiency with baseline creatinine about 1.5-2.0, hypertension, hyperlipidemia, and a systemic tremor.  We saw him a few weeks ago with NYHA III-IIIB symptoms. We discussed possibility of RHC and inotropes but he wanted to defer.  We decreased coreg and had labs. Also underwent AV optimization. Labs stable/  He returns today with his wife for routine f/u.   He is here for following up. SOB walking up hills and steps other wise denies SOB. No CP/ PND. Sleeps on one pillow. No edema in in lower extremity. Complains of gas.  Weight at home has been 183-184. He is weighing 2-3 times a week.   ROS: All systems negative except as listed in HPI, PMH and Problem List.  Past Medical History  Diagnosis Date  . CAD (coronary artery disease)      a. s/p anterior MI 12/05 c/b shock -> stent LAD   b. s/p stenting OM-1, 2/06  . CHF (congestive heart failure)     due to ischemic CM  a. EF 20-30%. (Nov 2008)   b. s/p St. Jude BiV-ICD    c. CPX 07/2008  pvo2 16.3 (63% predicted) slope 34 RER 1.08 O2 pulse 93%  . Atrial fibrillation or flutter     maintaining sinus on amiodarone  --PFTS, TFTs ok 8/10  . CRI (chronic renal insufficiency)     (baseline 2.0-2.2)  . HTN (hypertension)   . Hyperlipidemia   . GERD (gastroesophageal reflux disease)     Current Outpatient Prescriptions  Medication Sig Dispense Refill  . aspirin 325 MG EC tablet Take 325 mg by mouth daily.        . carvedilol (COREG) 6.25 MG  tablet Take 1 tablet (6.25 mg total) by mouth 2 (two) times daily with a meal.  120 tablet  6  . clopidogrel (PLAVIX) 75 MG tablet Take 1 tablet (75 mg total) by mouth daily.  90 tablet  3  . digoxin (LANOXIN) 0.125 MG tablet 1/2 tab once daily  30 tablet  6  . ezetimibe (ZETIA) 10 MG tablet Take 1 tablet (10 mg total) by mouth daily.  90 tablet  3  . ferrous sulfate ([employer] IRON) 325 (65 FE) MG tablet 3 times a week       . furosemide (LASIX) 20 MG tablet Take 1 tablet by mouth daily.      Marland Kitchen KLOR-CON M10 10 MEQ tablet Take 1 tablet by mouth daily.      Marland Kitchen levothyroxine (SYNTHROID, LEVOTHROID) 50 MCG tablet Take 50 mcg by mouth daily.        . Multiple Vitamins-Minerals (CENTRUM SILVER PO) Take 1 tablet by mouth daily.        . nitroGLYCERIN (NITROSTAT) 0.4 MG SL tablet Place 0.4 mg under the tongue as directed.        Marland Kitchen PACERONE 200 MG tablet Take 1/2 tablet by mouth once daily      . pantoprazole (PROTONIX) 40 MG tablet Take 1 tablet by mouth daily.      Marland Kitchen  rosuvastatin (CRESTOR) 20 MG tablet Take 1 tablet (20 mg total) by mouth daily.  90 tablet  3  . valsartan (DIOVAN) 80 MG tablet Take 1 tablet (80 mg total) by mouth daily.  90 tablet  3     PHYSICAL EXAM: Filed Vitals:   08/12/11 1048  BP: 104/56  Pulse: 66    General:   no resp difficulty HEENT: normal Neck: supple. JVP 5-6 Carotids 2+ bilat; no bruits. Cor: PMI laterally displaced. Regular rate & rhythm. No rubs, gallops, 2/6 SEM murmur at apex Lungs: clear with decreased air movement (mild) Abdomen: soft, nontender, distended  No hepatosplenomegaly. No bruits or masses. Good bowel sounds. Extremities: no cyanosis, clubbing, rash, edema Neuro: alert & orientedx3, cranial nerves grossly intact. moves all 4 extremities w/o difficulty. affect pleasant       ASSESSMENT & PLAN:

## 2011-08-12 NOTE — Patient Instructions (Signed)
Please take Diovan 40 mg in am and in pm.  Please continue to weigh daily.  Follow up 6 months.

## 2011-08-12 NOTE — Telephone Encounter (Signed)
6 months, pts wife aware 6 months, she is not sure what appt on 10/24/11 is, aware it is a remote device check

## 2011-08-12 NOTE — Assessment & Plan Note (Signed)
No evidence of ischemia. Continue current regimen.   

## 2011-08-15 ENCOUNTER — Other Ambulatory Visit: Payer: Self-pay | Admitting: Internal Medicine

## 2011-10-24 ENCOUNTER — Ambulatory Visit (INDEPENDENT_AMBULATORY_CARE_PROVIDER_SITE_OTHER): Payer: Medicare Other | Admitting: *Deleted

## 2011-10-24 DIAGNOSIS — I4891 Unspecified atrial fibrillation: Secondary | ICD-10-CM

## 2011-10-24 DIAGNOSIS — I2589 Other forms of chronic ischemic heart disease: Secondary | ICD-10-CM

## 2011-10-24 DIAGNOSIS — Z95810 Presence of automatic (implantable) cardiac defibrillator: Secondary | ICD-10-CM | POA: Diagnosis not present

## 2011-10-25 ENCOUNTER — Encounter: Payer: Self-pay | Admitting: Internal Medicine

## 2011-10-31 NOTE — Progress Notes (Signed)
Remote icd check  

## 2011-11-04 ENCOUNTER — Other Ambulatory Visit: Payer: Self-pay | Admitting: Internal Medicine

## 2011-11-05 ENCOUNTER — Encounter: Payer: Self-pay | Admitting: *Deleted

## 2011-11-26 DIAGNOSIS — Z85828 Personal history of other malignant neoplasm of skin: Secondary | ICD-10-CM | POA: Diagnosis not present

## 2011-11-26 DIAGNOSIS — L821 Other seborrheic keratosis: Secondary | ICD-10-CM | POA: Diagnosis not present

## 2012-01-23 ENCOUNTER — Ambulatory Visit (INDEPENDENT_AMBULATORY_CARE_PROVIDER_SITE_OTHER): Payer: Medicare Other | Admitting: *Deleted

## 2012-01-23 ENCOUNTER — Encounter: Payer: Self-pay | Admitting: Internal Medicine

## 2012-01-23 DIAGNOSIS — Z95810 Presence of automatic (implantable) cardiac defibrillator: Secondary | ICD-10-CM | POA: Diagnosis not present

## 2012-01-23 DIAGNOSIS — I4891 Unspecified atrial fibrillation: Secondary | ICD-10-CM

## 2012-01-24 LAB — REMOTE ICD DEVICE
AL AMPLITUDE: 3.7 mv
AL IMPEDENCE ICD: 500 Ohm
BRDY-0003RA: 120 {beats}/min
BRDY-0004RA: 120 {beats}/min
HV IMPEDENCE: 50 Ohm
TZAT-0004SLOWVT: 8
TZAT-0012SLOWVT: 200 ms
TZAT-0013FASTVT: 1
TZAT-0013SLOWVT: 3
TZAT-0018FASTVT: NEGATIVE
TZAT-0020FASTVT: 1 ms
TZAT-0020SLOWVT: 1 ms
TZON-0003FASTVT: 285 ms
TZON-0004FASTVT: 12
TZON-0004SLOWVT: 12
TZON-0005FASTVT: 6
TZON-0005SLOWVT: 6
TZST-0001FASTVT: 2
TZST-0001SLOWVT: 3
TZST-0001SLOWVT: 4
TZST-0001SLOWVT: 5
TZST-0003FASTVT: 36 J
TZST-0003FASTVT: 36 J
TZST-0003SLOWVT: 30 J

## 2012-01-30 NOTE — Progress Notes (Signed)
Remote icd check  

## 2012-02-11 ENCOUNTER — Encounter (HOSPITAL_COMMUNITY): Payer: Self-pay

## 2012-02-11 ENCOUNTER — Ambulatory Visit (HOSPITAL_COMMUNITY)
Admission: RE | Admit: 2012-02-11 | Discharge: 2012-02-11 | Disposition: A | Discharge status: Home / Self Care | Payer: Medicare Other | Source: Ambulatory Visit | Attending: Internal Medicine | Admitting: Internal Medicine

## 2012-02-11 ENCOUNTER — Encounter: Payer: Self-pay | Admitting: *Deleted

## 2012-02-11 VITALS — BP 122/60 | HR 66 | Wt 187.5 lb

## 2012-02-11 DIAGNOSIS — M25569 Pain in unspecified knee: Secondary | ICD-10-CM | POA: Diagnosis not present

## 2012-02-11 DIAGNOSIS — I4891 Unspecified atrial fibrillation: Secondary | ICD-10-CM | POA: Diagnosis not present

## 2012-02-11 DIAGNOSIS — M25561 Pain in right knee: Secondary | ICD-10-CM

## 2012-02-11 DIAGNOSIS — I2510 Atherosclerotic heart disease of native coronary artery without angina pectoris: Secondary | ICD-10-CM | POA: Insufficient documentation

## 2012-02-11 DIAGNOSIS — I5022 Chronic systolic (congestive) heart failure: Secondary | ICD-10-CM | POA: Insufficient documentation

## 2012-02-11 NOTE — Assessment & Plan Note (Signed)
No evidence of ischemia. Continue current regimen.   

## 2012-02-11 NOTE — Assessment & Plan Note (Signed)
Maintaining SR on amio. Refuses coumadin.

## 2012-02-11 NOTE — Assessment & Plan Note (Addendum)
Doing well. NYHA II-III. Volume status looks good. Digital scale provided. Reinforced need for daily weights and reviewed use of sliding scale diuretics. Unable to tolerate further titration of ARB and b-blocker.

## 2012-02-11 NOTE — Assessment & Plan Note (Signed)
Discussed with Dr. Ninfa Linden by phone. Suspect peripatellar bursitis. We have arranged for him to f/u there tomorrow.

## 2012-02-11 NOTE — Progress Notes (Signed)
Patient ID: [name], male   DOB: [birth year], 78 y.o.   MRN: [id number]  HPI:  [name] is delightful 78 year old male with a history of coronary artery disease, status post previous large anterior wall myocardial infarction.  This has been complicated by congestive heart failure.  Ejection fraction of 25%.  He is status post placement of a St. Jude BiV ICD.  He also has had multivessel stenting in the past. Remainder of his medical history is notable for atrial fibrillation,maintaining sinus rhythm on amiodarone.  He is not on Coumadin secondary to GI bleed, chronic renal insufficiency with baseline creatinine about 1.5-2.0, hypertension, hyperlipidemia, and a systemic tremor.  In 2012 had NYHA III-IIIB symptoms. We discussed possibility of RHC and inotropes but he wanted to defer. We decreased coreg and had labs. Also underwent AV optimization. Improved subsequently.  He returns today with his wife for routine f/u. Doing well. Remains active in yard. Able to pull up 421 pounds of carpet recently and rolled it up. Occasional dyspnea. No CP/ PND. Sleeps on one pillow. No lower extremity edema.  Weight at home has been 180. He is weighing only occasionally.  Tripped and fell in yard and hurt R knee. Now swollen and red. No fever/chills.    Had ICD interrogated last week: Remote CRT-D device check. Thresholds and sensing consistent with previous device measurements. Lead impedance trends stable over time. 7 mode switch episodes recorded, + plavix. No ventricular arrhythmia episodes recorded. Patient bi-ventricularly  pacing >100% of the time. Device programmed with appropriate safety margins. Heart failure diagnostics reviewed and trends are stable for patient. Next remote 04/30/12.   ROS: All systems negative except as listed in HPI, PMH and Problem List.  Past Medical History  Diagnosis Date  . CAD (coronary artery disease)      a. s/p anterior MI 12/05 c/b shock -> stent LAD   b. s/p  stenting OM-1, 2/06  . CHF (congestive heart failure)     due to ischemic CM  a. EF 20-30%. (Nov 2008)   b. s/p St. Jude BiV-ICD    c. CPX 07/2008  pvo2 16.3 (63% predicted) slope 34 RER 1.08 O2 pulse 93%  . Atrial fibrillation or flutter     maintaining sinus on amiodarone  --PFTS, TFTs ok 8/10  . CRI (chronic renal insufficiency)     (baseline 2.0-2.2)  . HTN (hypertension)   . Hyperlipidemia   . GERD (gastroesophageal reflux disease)     Current Outpatient Prescriptions  Medication Sig Dispense Refill  . aspirin 325 MG EC tablet Take 325 mg by mouth daily.        . carvedilol (COREG) 6.25 MG tablet Take 1 tablet (6.25 mg total) by mouth 2 (two) times daily with a meal.  120 tablet  6  . clopidogrel (PLAVIX) 75 MG tablet Take 1 tablet (75 mg total) by mouth daily.  90 tablet  3  . digoxin (LANOXIN) 0.125 MG tablet 1/2 tab once daily  30 tablet  6  . ezetimibe (ZETIA) 10 MG tablet Take 1 tablet (10 mg total) by mouth daily.  90 tablet  3  . ferrous sulfate ([employer] IRON) 325 (65 FE) MG tablet 3 times a week       . furosemide (LASIX) 20 MG tablet TAKE ONE TABLET BY MOUTH EVERY DAY  30 tablet  8  . KLOR-CON M10 10 MEQ tablet TAKE ONE TABLET  BY MOUTH EVERY DAY  60 each  2  . levothyroxine (SYNTHROID,  LEVOTHROID) 50 MCG tablet Take 50 mcg by mouth daily.        . Multiple Vitamins-Minerals (CENTRUM SILVER PO) Take 1 tablet by mouth daily.        . nitroGLYCERIN (NITROSTAT) 0.4 MG SL tablet Place 0.4 mg under the tongue as directed.        Marland Kitchen PACERONE 200 MG tablet TAKE ONE-HALF TABLET BY MOUTH EVERY DAY  90 each  2  . pantoprazole (PROTONIX) 40 MG tablet Take 1 tablet by mouth daily.      . rosuvastatin (CRESTOR) 20 MG tablet Take 1 tablet (20 mg total) by mouth daily.  90 tablet  3  . valsartan (DIOVAN) 80 MG tablet Take 40 mg by mouth 2 (two) times daily.      Marland Kitchen DISCONTD: valsartan (DIOVAN) 80 MG tablet Take 1 tablet (80 mg total) by mouth daily.  90 tablet  3     PHYSICAL EXAM: Filed  Vitals:   02/11/12 1056  BP: 122/60  Pulse: 100    General:   no resp difficulty HEENT: normal Neck: supple. JVP 5-6 Carotids 2+ bilat; no bruits. Cor: PMI laterally displaced. Regular rate & rhythm. No rubs, gallops, 2/6 SEM murmur at apex Lungs: clear with decreased air movement (mild) Abdomen: soft, nontender, distended  No hepatosplenomegaly. No bruits or masses. Good bowel sounds. Extremities: no cyanosis, clubbing, rash, edema. +vericose veins. R knee swollen + ecchymosis. Half-dollar sized area of swelling and erythema above patella Neuro: alert & orientedx3, cranial nerves grossly intact. moves all 4 extremities w/o difficulty. affect pleasant  ECG: AV pacing 66     ASSESSMENT & PLAN:

## 2012-02-11 NOTE — Patient Instructions (Signed)
We will contact you in 3 months to schedule your next appointment.  

## 2012-02-12 DIAGNOSIS — M25569 Pain in unspecified knee: Secondary | ICD-10-CM | POA: Diagnosis not present

## 2012-02-14 ENCOUNTER — Telehealth (HOSPITAL_COMMUNITY): Payer: Self-pay | Admitting: *Deleted

## 2012-02-14 MED ORDER — VALSARTAN 80 MG PO TABS: 40.0000 mg | ORAL_TABLET | Freq: Two times a day (BID) | ORAL | Status: DC

## 2012-02-14 NOTE — Telephone Encounter (Signed)
Pt's wife aware, rx sent in

## 2012-02-14 NOTE — Telephone Encounter (Signed)
Ms Smilowitz called today. She needs a refill for her husband on the diovan 80 mg called into [facility].  Thanks.

## 2012-02-17 ENCOUNTER — Telehealth (HOSPITAL_COMMUNITY): Payer: Self-pay | Admitting: *Deleted

## 2012-02-17 NOTE — Telephone Encounter (Signed)
Per [facility] pt needs PA for his Diovan, Dr Haroldine Laws called [phone]) and med was approved until the end of the year, [facility] aware

## 2012-03-04 ENCOUNTER — Other Ambulatory Visit: Payer: Self-pay | Admitting: Internal Medicine

## 2012-03-06 DIAGNOSIS — E039 Hypothyroidism, unspecified: Secondary | ICD-10-CM | POA: Diagnosis not present

## 2012-03-06 DIAGNOSIS — Z125 Encounter for screening for malignant neoplasm of prostate: Secondary | ICD-10-CM | POA: Diagnosis not present

## 2012-03-06 DIAGNOSIS — IMO0001 Reserved for inherently not codable concepts without codable children: Secondary | ICD-10-CM | POA: Diagnosis not present

## 2012-03-06 DIAGNOSIS — E785 Hyperlipidemia, unspecified: Secondary | ICD-10-CM | POA: Diagnosis not present

## 2012-03-06 DIAGNOSIS — I10 Essential (primary) hypertension: Secondary | ICD-10-CM | POA: Diagnosis not present

## 2012-03-12 DIAGNOSIS — I2510 Atherosclerotic heart disease of native coronary artery without angina pectoris: Secondary | ICD-10-CM | POA: Diagnosis not present

## 2012-03-12 DIAGNOSIS — Z0000 Encounter for general adult medical examination without abnormal findings: Secondary | ICD-10-CM | POA: Diagnosis not present

## 2012-03-12 DIAGNOSIS — I2589 Other forms of chronic ischemic heart disease: Secondary | ICD-10-CM | POA: Diagnosis not present

## 2012-03-12 DIAGNOSIS — Z125 Encounter for screening for malignant neoplasm of prostate: Secondary | ICD-10-CM | POA: Diagnosis not present

## 2012-03-16 DIAGNOSIS — Z1212 Encounter for screening for malignant neoplasm of rectum: Secondary | ICD-10-CM | POA: Diagnosis not present

## 2012-04-28 ENCOUNTER — Telehealth (HOSPITAL_COMMUNITY): Payer: Self-pay | Admitting: *Deleted

## 2012-04-28 NOTE — Telephone Encounter (Signed)
Please call [name], in regards to [name] f/u appt.  She would like to talk to heather.

## 2012-04-28 NOTE — Telephone Encounter (Signed)
Spoke w/pt's wife f/u appt scheduled

## 2012-04-29 DIAGNOSIS — I10 Essential (primary) hypertension: Secondary | ICD-10-CM | POA: Diagnosis not present

## 2012-04-29 DIAGNOSIS — R310 Gross hematuria: Secondary | ICD-10-CM | POA: Diagnosis not present

## 2012-04-30 ENCOUNTER — Encounter: Payer: Self-pay | Admitting: Internal Medicine

## 2012-04-30 ENCOUNTER — Ambulatory Visit (INDEPENDENT_AMBULATORY_CARE_PROVIDER_SITE_OTHER): Payer: Medicare Other | Admitting: *Deleted

## 2012-04-30 DIAGNOSIS — I2589 Other forms of chronic ischemic heart disease: Secondary | ICD-10-CM

## 2012-05-01 ENCOUNTER — Telehealth (HOSPITAL_COMMUNITY): Payer: Self-pay | Admitting: *Deleted

## 2012-05-01 DIAGNOSIS — R310 Gross hematuria: Secondary | ICD-10-CM | POA: Diagnosis not present

## 2012-05-01 LAB — REMOTE ICD DEVICE
AL IMPEDENCE ICD: 480 Ohm
BRDY-0002RA: 60 {beats}/min
BRDY-0003RA: 120 {beats}/min
BRDY-0004RA: 120 {beats}/min
LV LEAD IMPEDENCE ICD: 760 Ohm
RV LEAD IMPEDENCE ICD: 360 Ohm
TZAT-0001FASTVT: 1
TZAT-0004FASTVT: 8
TZAT-0004SLOWVT: 8
TZAT-0012FASTVT: 200 ms
TZAT-0012SLOWVT: 200 ms
TZAT-0013SLOWVT: 3
TZAT-0018FASTVT: NEGATIVE
TZAT-0018SLOWVT: NEGATIVE
TZAT-0019FASTVT: 7.5 V
TZAT-0019SLOWVT: 7.5 V
TZAT-0020FASTVT: 1 ms
TZON-0003FASTVT: 285 ms
TZON-0003SLOWVT: 350 ms
TZON-0004FASTVT: 12
TZON-0004SLOWVT: 12
TZON-0005FASTVT: 6
TZON-0010FASTVT: 80 ms
TZST-0001FASTVT: 3
TZST-0001FASTVT: 4
TZST-0001SLOWVT: 3
TZST-0001SLOWVT: 5
TZST-0003FASTVT: 36 J
TZST-0003FASTVT: 36 J
TZST-0003SLOWVT: 30 J
TZST-0003SLOWVT: 36 J

## 2012-05-01 NOTE — Telephone Encounter (Signed)
The patient noted hematuria.  Dr. Virgina Jock ruled out infection and had patient hold ASA and plavis until follow up with [facility].  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  Surgery has been recommended but the patient would like to speak to Dr. Haroldine Laws prior to scheduling procedure.  Have informed the patient and his wife that Dr. Haroldine Laws will not be back until Monday.  They would like to speak to him at this time.  The patient's bleeding has increased since cysto today.  Have asked them to continue to hold plavix (last stent 2006), he may start back ASA if bleeding improves.  The patient's wife voiced understanding and appreciated the call back.

## 2012-05-01 NOTE — Telephone Encounter (Signed)
Please call Romie Minus (wife of patient) in regards to surgery that was just presented to [facility].

## 2012-05-05 ENCOUNTER — Other Ambulatory Visit: Payer: Self-pay | Admitting: Urology

## 2012-05-05 ENCOUNTER — Telehealth (HOSPITAL_COMMUNITY): Payer: Self-pay | Admitting: Internal Medicine

## 2012-05-05 NOTE — Telephone Encounter (Signed)
[name] called and stated that [name] has been off his plavix and aspirin 6 days and would like to know if Dr. Haroldine Laws could call and let him what the status is concerning when he needs to start back taking his aspirin and plavix.  Thanks

## 2012-05-06 ENCOUNTER — Telehealth (HOSPITAL_COMMUNITY): Payer: Self-pay | Admitting: *Deleted

## 2012-05-06 NOTE — Telephone Encounter (Signed)
See phone note 7/24

## 2012-05-06 NOTE — Telephone Encounter (Signed)
Please call patient in regards to [facility], would like a call today.

## 2012-05-06 NOTE — Telephone Encounter (Signed)
Left message to call back  

## 2012-05-07 NOTE — Telephone Encounter (Signed)
Pt's wife aware see phone note 7/24

## 2012-05-07 NOTE — Telephone Encounter (Addendum)
Pt's wife aware ok for surgery per Dr Haroldine Laws, she does mention pt is having more fatigue than usually and this has been going on for a couple of weeks, his wt is down about 5-6 lbs from the end of April and he has no edema or SOB, will let Dr Haroldine Laws know

## 2012-05-07 NOTE — Telephone Encounter (Signed)
Patient at moderate risk for surgery but has no choice he needs to have tumor removed. Ok to stop asa and plavix. Would proceed without further testing.

## 2012-05-13 ENCOUNTER — Telehealth (HOSPITAL_COMMUNITY): Payer: Self-pay | Admitting: *Deleted

## 2012-05-13 ENCOUNTER — Encounter (HOSPITAL_COMMUNITY): Payer: Self-pay | Admitting: Pharmacy Technician

## 2012-05-13 NOTE — Telephone Encounter (Signed)
Pt advised that it is okay to have PPM checked at Dr. Olin Pia office.

## 2012-05-13 NOTE — Telephone Encounter (Signed)
[name] is seeing Dr Caryl Comes tomorrow to ck his defib & pacemaker, he is to have a procedure on 8/12 and has questions with [facility] before his procedure.  Please give patient a call.

## 2012-05-14 ENCOUNTER — Encounter: Payer: Self-pay | Admitting: Internal Medicine

## 2012-05-14 ENCOUNTER — Ambulatory Visit (INDEPENDENT_AMBULATORY_CARE_PROVIDER_SITE_OTHER): Payer: Medicare Other | Admitting: Cardiology

## 2012-05-14 ENCOUNTER — Encounter: Payer: Self-pay | Admitting: Cardiology

## 2012-05-14 VITALS — BP 95/65 | HR 89 | Ht 71.0 in | Wt 184.1 lb

## 2012-05-14 DIAGNOSIS — I5022 Chronic systolic (congestive) heart failure: Secondary | ICD-10-CM | POA: Diagnosis not present

## 2012-05-14 DIAGNOSIS — I4891 Unspecified atrial fibrillation: Secondary | ICD-10-CM | POA: Diagnosis not present

## 2012-05-14 DIAGNOSIS — I2589 Other forms of chronic ischemic heart disease: Secondary | ICD-10-CM | POA: Diagnosis not present

## 2012-05-14 DIAGNOSIS — Z95810 Presence of automatic (implantable) cardiac defibrillator: Secondary | ICD-10-CM | POA: Diagnosis not present

## 2012-05-14 NOTE — Progress Notes (Signed)
ELECTROPHYSIOLOGY OFFICE NOTE  Patient ID: [name] MRN: [id number], DOB/AGE: Dec 31, 1931   Date of Visit: 05/14/2012  Primary Physician: [name], MD Primary Cardiologist: Haroldine Laws, MD Reason for Visit: Fatigue  History of Present Illness [name] is a pleasant 78 year old gentleman with CAD, ischemic CM with chronic systolic CHF s/p BiV ICD implantation, PAF, CKD, HTN and dyslipidemia who presents today for evaluation of fatigue. He is accompanied by his wife. He reports feeling this way previously and at that time there was some sort of programming change made to his pacemaker that made him feel better. So they called to schedule this appointment for device/pacemaker evaluation. Of note, he is scheduled for urologic surgery on 05/25/2012 and has been given cardiac clearance for surgery from Dr. Haroldine Laws, see phone note 05/07/2012. He reports his fatigue has been constant x 2 weeks stating, "I just don't have any energy." He denies CP, SOB, palpitations, dizziness, near-syncope syncope. He denies LE swelling, orthopnea or PND.   Past Medical History  Diagnosis Date  . CAD (coronary artery disease)      a. s/p anterior MI 12/05 c/b shock -> stent LAD   b. s/p stenting OM-1, 2/06  . CHF (congestive heart failure)     due to ischemic CM  a. EF 20-30%. (Nov 2008)   b. s/p St. Jude BiV-ICD    c. CPX 07/2008  pvo2 16.3 (63% predicted) slope 34 RER 1.08 O2 pulse 93%  . Atrial fibrillation or flutter     maintaining sinus on amiodarone  --PFTS, TFTs ok 8/10  . CRI (chronic renal insufficiency)     (baseline 2.0-2.2)  . HTN (hypertension)   . Hyperlipidemia   . GERD (gastroesophageal reflux disease)      Past Surgical History  Procedure Date  . Hernia repair 1989    bilateral  . Cervical laminectomy 1985  . Back surgery 1972    lower back  . Cardiac defibrillator placement     ICD St Jude     Allergies/Intolerances Allergies  Allergen Reactions  . Amoxicillin    REACTION: rash  . Ramipril     REACTION: cough  . Penicillins Rash    Current Home Medications Current Outpatient Prescriptions  Medication Sig Dispense Refill  . amiodarone (PACERONE) 200 MG tablet Take 100 mg by mouth every morning.      Marland Kitchen aspirin 325 MG EC tablet Take 325 mg by mouth every morning.       . carvedilol (COREG) 6.25 MG tablet Take 1 tablet (6.25 mg total) by mouth 2 (two) times daily with a meal.  120 tablet  6  . clopidogrel (PLAVIX) 75 MG tablet Take 75 mg by mouth every morning.      . digoxin (LANOXIN) 0.125 MG tablet Take 0.25 mg by mouth every morning.      . ezetimibe (ZETIA) 10 MG tablet Take 10 mg by mouth every morning.      . ferrous sulfate ([employer] IRON) 325 (65 FE) MG tablet Take 325 mg by mouth 3 (three) times a week. 3 times a week      . furosemide (LASIX) 20 MG tablet Take 20 mg by mouth every morning.      Marland Kitchen levothyroxine (SYNTHROID, LEVOTHROID) 50 MCG tablet Take 50 mcg by mouth daily before breakfast.       . Multiple Vitamins-Minerals (CENTRUM SILVER PO) Take 1 tablet by mouth daily.        . nitroGLYCERIN (NITROSTAT) 0.4 MG  SL tablet Place 0.4 mg under the tongue every 5 (five) minutes as needed. For chest pain      . pantoprazole (PROTONIX) 40 MG tablet Take 1 tablet by mouth every morning.       . potassium chloride (K-DUR) 10 MEQ tablet Take 10 mEq by mouth every morning.      . rosuvastatin (CRESTOR) 20 MG tablet Take 20 mg by mouth at bedtime.      . valsartan (DIOVAN) 80 MG tablet Take 0.5 tablets (40 mg total) by mouth 2 (two) times daily.  30 tablet  12    Social History Social History  . Marital Status: Married   Occupational History  . retired    Social History Main Topics  . Smoking status: Former Research scientist (life sciences)  . Smokeless tobacco: Never Used   Comment: quit in 2005  . Alcohol Use: No  . Drug Use: No   Review of Systems General: No chills, fever, night sweats or weight changes Cardiovascular: No chest pain, dyspnea on exertion, edema,  orthopnea, palpitations, paroxysmal nocturnal dyspnea Dermatological: No rash, lesions or masses Respiratory: No cough, dyspnea Urologic: No hematuria, dysuria Abdominal: No nausea, vomiting, diarrhea, bright red blood per rectum, melena, or hematemesis Neurologic: No visual changes, weakness, changes in mental status All other systems reviewed and are otherwise negative except as noted above.  Physical Exam Blood pressure 95/65, pulse 89, height 5\' 11"  (1.803 m), weight 184 lb 1.9 oz (83.516 kg).  General: Well developed, well appearing 78 year old male in no acute distress. HEENT: Normocephalic, atraumatic. EOMs intact. Sclera nonicteric. Oropharynx clear.  Neck: Supple. No JVD. Lungs: Respirations regular and unlabored, CTA bilaterally. No wheezes, rales or rhonchi. Heart: RRR. S1, S2 present. No murmurs, rub, S3 or S4. Abdomen: Soft, non-distended. Extremities: No clubbing, cyanosis or edema.  Psych: Normal affect. Neuro: Alert and oriented X 3. Moves all extremities spontaneously.   Diagnostics 12-lead ECG - AV paced at 60 bpm Device interrogation shows normal BiV ICD function with good battery status and stable lead parameters/measurements; QuickOpt performed with programming changes made accordingly; no VT/VF episodes; see PaceArt report  Assessment and Plan 1. Chronic systolic CHF, ischemic CM s/p BiV ICD implant - normal device function; QuickOpt performed with programming changes made accordingly; keep scheduled follow-up with Dr. Caryl Comes in October 2013 unless needed sooner  [name] and his wife expressed verbal understanding and agree with this plan of care. Signed, Ileene Hutchinson, PA-C 05/14/2012, 3:00 PM

## 2012-05-18 ENCOUNTER — Encounter (HOSPITAL_COMMUNITY): Payer: Self-pay

## 2012-05-18 ENCOUNTER — Ambulatory Visit (HOSPITAL_COMMUNITY)
Admission: RE | Admit: 2012-05-18 | Discharge: 2012-05-18 | Disposition: A | Discharge status: Home / Self Care | Payer: Medicare Other | Source: Ambulatory Visit | Attending: Internal Medicine | Admitting: Internal Medicine

## 2012-05-18 VITALS — BP 90/55 | HR 62 | Ht 70.0 in | Wt 183.1 lb

## 2012-05-18 DIAGNOSIS — I509 Heart failure, unspecified: Secondary | ICD-10-CM | POA: Insufficient documentation

## 2012-05-18 DIAGNOSIS — Z7982 Long term (current) use of aspirin: Secondary | ICD-10-CM | POA: Insufficient documentation

## 2012-05-18 DIAGNOSIS — I5022 Chronic systolic (congestive) heart failure: Secondary | ICD-10-CM | POA: Diagnosis not present

## 2012-05-18 DIAGNOSIS — N189 Chronic kidney disease, unspecified: Secondary | ICD-10-CM | POA: Insufficient documentation

## 2012-05-18 DIAGNOSIS — D494 Neoplasm of unspecified behavior of bladder: Secondary | ICD-10-CM | POA: Insufficient documentation

## 2012-05-18 DIAGNOSIS — I4891 Unspecified atrial fibrillation: Secondary | ICD-10-CM | POA: Diagnosis not present

## 2012-05-18 DIAGNOSIS — I2510 Atherosclerotic heart disease of native coronary artery without angina pectoris: Secondary | ICD-10-CM | POA: Diagnosis not present

## 2012-05-18 DIAGNOSIS — I129 Hypertensive chronic kidney disease with stage 1 through stage 4 chronic kidney disease, or unspecified chronic kidney disease: Secondary | ICD-10-CM | POA: Insufficient documentation

## 2012-05-18 DIAGNOSIS — K219 Gastro-esophageal reflux disease without esophagitis: Secondary | ICD-10-CM | POA: Diagnosis not present

## 2012-05-18 DIAGNOSIS — G250 Essential tremor: Secondary | ICD-10-CM | POA: Diagnosis not present

## 2012-05-18 DIAGNOSIS — I252 Old myocardial infarction: Secondary | ICD-10-CM | POA: Insufficient documentation

## 2012-05-18 DIAGNOSIS — E785 Hyperlipidemia, unspecified: Secondary | ICD-10-CM | POA: Diagnosis not present

## 2012-05-18 LAB — ICD DEVICE OBSERVATION
AL AMPLITUDE: 3.9 mv
AL THRESHOLD: 0.5 V
BAMS-0001: 170 {beats}/min
DEVICE MODEL ICD: 528626
LV LEAD IMPEDENCE ICD: 800 Ohm
RV LEAD AMPLITUDE: 9.5 mv
TZAT-0001FASTVT: 1
TZAT-0004FASTVT: 8
TZAT-0004SLOWVT: 8
TZAT-0013SLOWVT: 3
TZAT-0018SLOWVT: NEGATIVE
TZAT-0019FASTVT: 7.5 V
TZAT-0019SLOWVT: 7.5 V
TZAT-0020FASTVT: 1 ms
TZON-0003SLOWVT: 350 ms
TZON-0004FASTVT: 12
TZON-0004SLOWVT: 12
TZON-0005FASTVT: 6
TZON-0010FASTVT: 80 ms
TZON-0010SLOWVT: 80 ms
TZST-0001FASTVT: 2
TZST-0001FASTVT: 4
TZST-0001SLOWVT: 2
TZST-0001SLOWVT: 3
TZST-0001SLOWVT: 5
TZST-0003FASTVT: 36 J
TZST-0003FASTVT: 36 J
TZST-0003SLOWVT: 30 J
TZST-0003SLOWVT: 36 J
VENTRICULAR PACING ICD: 99 pct

## 2012-05-18 NOTE — Progress Notes (Signed)
Patient ID: [name], male   DOB: 12-16-1931, 78 y.o.   MRN: [id number] Urologist: Dr [name] HPI: [name] is delightful 78 year old male with a history of coronary artery disease, status post previous large anterior wall myocardial infarction.  This has been complicated by congestive heart failure.  Ejection fraction of 25%.  He is status post placement of a St. Jude BiV ICD.  He also has had multivessel stenting in the past. Remainder of his medical history is notable for atrial fibrillation,maintaining sinus rhythm on amiodarone.  He is not on Coumadin secondary to GI bleed, chronic renal insufficiency with baseline creatinine about 1.5-2.0, hypertension, hyperlipidemia, and a systemic tremor.  In 2012 had NYHA III-IIIB symptoms. We discussed possibility of RHC and inotropes but he wanted to defer. We decreased coreg and had labs. Also underwent AV optimization. Improved subsequently.  Had ICD interrogated last week: Remote CRT-D device check. Thresholds and sensing consistent with previous device measurements. Lead impedance trends stable over time. 7 mode switch episodes recorded, + plavix. No ventricular arrhythmia episodes recorded. Patient bi-ventricularly  pacing >100% of the time. Device programmed with appropriate safety margins. Heart failure diagnostics reviewed and trends are stable for patient. Next remote 04/30/12.  He returns for follow up with his wife. Recently diagnosed with bladder cancer. Denies hematuria. Denies dizziness/SOB/PND/Orthopnea/CP.  Complaint with medications. Weighing daily weight stable 179-180. Staying active. 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 Can walk around store without any problem. No dizziness.    ROS: All systems negative except as listed in HPI, PMH and Problem List.  Past Medical History  Diagnosis Date  . CAD (coronary artery disease)      a. s/p anterior MI 12/05 c/b shock -> stent LAD   b. s/p stenting OM-1, 2/06  . CHF (congestive heart failure)       due to ischemic CM  a. EF 20-30%. (Nov 2008)   b. s/p St. Jude BiV-ICD    c. CPX 07/2008  pvo2 16.3 (63% predicted) slope 34 RER 1.08 O2 pulse 93%  . Atrial fibrillation or flutter     maintaining sinus on amiodarone  --PFTS, TFTs ok 8/10  . CRI (chronic renal insufficiency)     (baseline 2.0-2.2)  . HTN (hypertension)   . Hyperlipidemia   . GERD (gastroesophageal reflux disease)     Current Outpatient Prescriptions  Medication Sig Dispense Refill  . amiodarone (PACERONE) 200 MG tablet Take 100 mg by mouth every morning.      Marland Kitchen aspirin 325 MG EC tablet Take 325 mg by mouth every morning.       . carvedilol (COREG) 6.25 MG tablet Take 1 tablet (6.25 mg total) by mouth 2 (two) times daily with a meal.  120 tablet  6  . clopidogrel (PLAVIX) 75 MG tablet Take 75 mg by mouth every morning.      . digoxin (LANOXIN) 0.125 MG tablet Take 0.25 mg by mouth every morning.      . ezetimibe (ZETIA) 10 MG tablet Take 10 mg by mouth every morning.      . ferrous sulfate ([employer] IRON) 325 (65 FE) MG tablet Take 325 mg by mouth 3 (three) times a week. 3 times a week      . furosemide (LASIX) 20 MG tablet Take 20 mg by mouth every morning.      Marland Kitchen levothyroxine (SYNTHROID, LEVOTHROID) 50 MCG tablet Take 50 mcg by mouth daily before breakfast.       . Multiple Vitamins-Minerals (CENTRUM SILVER PO)  Take 1 tablet by mouth daily.        . nitroGLYCERIN (NITROSTAT) 0.4 MG SL tablet Place 0.4 mg under the tongue every 5 (five) minutes as needed. For chest pain      . pantoprazole (PROTONIX) 40 MG tablet Take 1 tablet by mouth every morning.       . potassium chloride (K-DUR) 10 MEQ tablet Take 10 mEq by mouth every morning.      . rosuvastatin (CRESTOR) 20 MG tablet Take 20 mg by mouth at bedtime.      . valsartan (DIOVAN) 80 MG tablet Take 0.5 tablets (40 mg total) by mouth 2 (two) times daily.  30 tablet  12     PHYSICAL EXAM: Filed Vitals:   05/18/12 1453  BP: 90/55  Pulse: 62    General:   no resp  difficulty (wife present)  HEENT: normal Neck: supple. JVP 5-6 Carotids 2+ bilat; no bruits. Cor: PMI laterally displaced. Regular rate & rhythm. No rubs, gallops, 2/6 SEM murmur at apex Lungs: clear with decreased air movement (mild) Abdomen: soft, nontender, distended  No hepatosplenomegaly. No bruits or masses. Good bowel sounds. Extremities: no cyanosis, clubbing, rash, edema. +vericose veins. R knee swollen + ecchymosis. Half-dollar sized area of swelling and erythema above patella Neuro: alert & orientedx3, cranial nerves grossly intact. moves all 4 extremities w/o difficulty. affect pleasant      ASSESSMENT & PLAN:

## 2012-05-18 NOTE — Assessment & Plan Note (Signed)
Encouraged him to have it resected. He is stable from cardiac perspective for the procedure. Stop Plavix today. Hold ASA starting Wednesday.

## 2012-05-18 NOTE — Addendum Note (Signed)
Encounter addended by: Scarlette Calico, RN on: 05/18/2012  4:24 PM<BR>     Documentation filed: Patient Instructions Section

## 2012-05-18 NOTE — Assessment & Plan Note (Signed)
Maintaining SR on amio. Refused coumadin due to bleeding.

## 2012-05-18 NOTE — Assessment & Plan Note (Signed)
No evidence of ischemia. Continue current regimen.   

## 2012-05-18 NOTE — Assessment & Plan Note (Addendum)
Stable NYHA II-early III. Volume status looks good. Unable to titrate HF medications due to soft SBP. Follow up in 3 months.

## 2012-05-18 NOTE — Patient Instructions (Addendum)
Follow up in 3 months   Do the following things EVERYDAY: 1) Weigh yourself in the morning before breakfast. Write it down and keep it in a log. 2) Take your medicines as prescribed 3) Eat low salt foods-Limit salt (sodium) to 2000 mg per day.  4) Stay as active as you can everyday 5) Limit all fluids for the day to less than 2 liters  

## 2012-05-19 ENCOUNTER — Ambulatory Visit (HOSPITAL_COMMUNITY)
Admission: RE | Admit: 2012-05-19 | Discharge: 2012-05-19 | Disposition: A | Discharge status: Home / Self Care | Payer: Medicare Other | Source: Ambulatory Visit | Attending: Urology | Admitting: Urology

## 2012-05-19 ENCOUNTER — Encounter (HOSPITAL_COMMUNITY)
Admission: RE | Admit: 2012-05-19 | Discharge: 2012-05-19 | Disposition: A | Discharge status: Home / Self Care | Payer: Medicare Other | Source: Ambulatory Visit | Attending: Urology | Admitting: Urology

## 2012-05-19 ENCOUNTER — Encounter (HOSPITAL_COMMUNITY): Payer: Self-pay

## 2012-05-19 DIAGNOSIS — R059 Cough, unspecified: Secondary | ICD-10-CM | POA: Insufficient documentation

## 2012-05-19 DIAGNOSIS — Z95810 Presence of automatic (implantable) cardiac defibrillator: Secondary | ICD-10-CM | POA: Insufficient documentation

## 2012-05-19 DIAGNOSIS — Z01812 Encounter for preprocedural laboratory examination: Secondary | ICD-10-CM | POA: Diagnosis not present

## 2012-05-19 DIAGNOSIS — Z01811 Encounter for preprocedural respiratory examination: Secondary | ICD-10-CM | POA: Diagnosis not present

## 2012-05-19 DIAGNOSIS — I700 Atherosclerosis of aorta: Secondary | ICD-10-CM | POA: Insufficient documentation

## 2012-05-19 DIAGNOSIS — R05 Cough: Secondary | ICD-10-CM | POA: Insufficient documentation

## 2012-05-19 DIAGNOSIS — Z01818 Encounter for other preprocedural examination: Secondary | ICD-10-CM | POA: Diagnosis not present

## 2012-05-19 HISTORY — DX: Myoneural disorder, unspecified: G70.9

## 2012-05-19 HISTORY — DX: Cough: R05

## 2012-05-19 HISTORY — DX: Unspecified malignant neoplasm of skin, unspecified: C44.90

## 2012-05-19 HISTORY — DX: Anemia, unspecified: D64.9

## 2012-05-19 HISTORY — DX: Cough, unspecified: R05.9

## 2012-05-19 HISTORY — DX: Personal history of other medical treatment: Z92.89

## 2012-05-19 HISTORY — DX: Hypothyroidism, unspecified: E03.9

## 2012-05-19 LAB — BASIC METABOLIC PANEL
CO2: 30 mEq/L (ref 19–32)
Calcium: 9.7 mg/dL (ref 8.4–10.5)
Creatinine, Ser: 1.6 mg/dL — ABNORMAL HIGH (ref 0.50–1.35)
Glucose, Bld: 114 mg/dL — ABNORMAL HIGH (ref 70–99)

## 2012-05-19 LAB — CBC
HCT: 39.5 % (ref 39.0–52.0)
Hemoglobin: 12.7 g/dL — ABNORMAL LOW (ref 13.0–17.0)
MCH: 29.6 pg (ref 26.0–34.0)
MCHC: 32.2 g/dL (ref 30.0–36.0)
MCV: 92.1 fL (ref 78.0–100.0)
Platelets: 231 K/uL (ref 150–400)
RBC: 4.29 MIL/uL (ref 4.22–5.81)
RDW: 13.5 % (ref 11.5–15.5)
WBC: 6.5 K/uL (ref 4.0–10.5)

## 2012-05-19 LAB — SURGICAL PCR SCREEN
MRSA, PCR: NEGATIVE
Staphylococcus aureus: POSITIVE — AB

## 2012-05-19 NOTE — Pre-Procedure Instructions (Signed)
States has a scrotal rash/itch with reddness- states has had several weeks- states has been seen by Dr Virgina Jock and Dr [name] is aware. States has been using "jock itch spray" since yesterday

## 2012-05-19 NOTE — Patient Instructions (Addendum)
[place]  05/19/2012   Your procedure is scheduled on:  05/25/12  Monday  Surgery [id number]  Report to [facility] at  [place]     AM.  Call this number if you have problems the morning of surgery: [phone]     Or PST   [id number]  [facility]   Remember:   Do not eat food or drink any fluids :After Midnight. Sunday NIGHT      Take these medicines the morning of surgery with A SIP OF WATER:COREG, PACERONE, DIGOXIN, LEVOTHYROXINE, PROTONIX                                      May take nitroglycerin if needed   Do not wear jewelry, make-up or nail polish.  Do not wear lotions, powders, or perfumes. You may wear deodorant.  Do not shave 48 hours prior to surgery.  Do not bring valuables to the hospital.  Contacts, dentures or bridgework may not be worn into surgery.  Leave suitcase in the car. After surgery it may be brought to your room.  For patients admitted to the hospital, checkout time is 11:00 AM the day of discharge.   Patients discharged the day of surgery will not be allowed to drive home.  Name and phone number of your driver:   wife                                                                   Special Instructions: CHG Shower Use Special Wash: 1/2 bottle night before surgery and 1/2 bottle morning of surgery. REGULAR SOAP FACE AND PRIVATES                            MEN-MAY SHAVE FACE MORNING OF SURGERY  Please read over the following fact sheets that you were given: MRSA Information

## 2012-05-19 NOTE — Pre-Procedure Instructions (Signed)
Faxed abnormal bmet with cbc to Dr [name]  With confirmation for review

## 2012-05-21 NOTE — Pre-Procedure Instructions (Signed)
Received fax from Dr Darol Destine office that labs have been reviewed and pt has chronic renal disease which accounts for abnormal BMP.   [name] called today stating that her husbands scrotal rash has not improved-instructed her to call Dr Darol Destine office

## 2012-05-24 NOTE — Anesthesia Preprocedure Evaluation (Addendum)
Anesthesia Evaluation  Patient identified by MRN, date of birth, ID band Patient awake    Reviewed: Allergy & Precautions, H&P , NPO status , Patient's Chart, lab work & pertinent test results, reviewed documented beta blocker date and time   Airway Mallampati: II TM Distance: >3 FB Neck ROM: Full    Dental  (+) Missing and Dental Advisory Given,    Pulmonary  breath sounds clear to auscultation  Pulmonary exam normal       Cardiovascular Exercise Tolerance: Poor hypertension, Pt. on medications and Pt. on home beta blockers + CAD, + Past MI, + Cardiac Stents and +CHF + dysrhythmias Atrial Fibrillation + pacemaker + Cardiac Defibrillator Rhythm:Regular Rate:Normal     Neuro/Psych negative psych ROS   GI/Hepatic GERD-  Medicated,  Endo/Other  Hypothyroidism   Renal/GU Renal Insufficiency and CRFRenal disease     Musculoskeletal   Abdominal (+) - obese,  Abdomen: soft.    Peds  Hematology negative hematology ROS (+)   Anesthesia Other Findings   Reproductive/Obstetrics                         Anesthesia Physical Anesthesia Plan  ASA: IV  Anesthesia Plan: General and Spinal   Post-op Pain Management:    Induction: Intravenous  Airway Management Planned: Oral ETT  Additional Equipment:   Intra-op Plan:   Post-operative Plan: Extubation in OR  Informed Consent: I have reviewed the patients History and Physical, chart, labs and discussed the procedure including the risks, benefits and alternatives for the proposed anesthesia with the patient or authorized representative who has indicated his/her understanding and acceptance.   Dental advisory given  Plan Discussed with: CRNA and Surgeon  Anesthesia Plan Comments:        Anesthesia Quick Evaluation

## 2012-05-25 ENCOUNTER — Encounter (HOSPITAL_COMMUNITY): Payer: Self-pay | Admitting: *Deleted

## 2012-05-25 ENCOUNTER — Encounter (HOSPITAL_COMMUNITY): Payer: Self-pay | Admitting: Anesthesiology

## 2012-05-25 ENCOUNTER — Ambulatory Visit (HOSPITAL_COMMUNITY)
Admission: RE | Admit: 2012-05-25 | Discharge: 2012-05-25 | Disposition: A | Discharge status: Home / Self Care | Payer: Medicare Other | Source: Ambulatory Visit | Attending: Urology | Admitting: Urology

## 2012-05-25 ENCOUNTER — Encounter (HOSPITAL_COMMUNITY)
Admission: RE | Disposition: A | Discharge status: Home / Self Care | Payer: Self-pay | Source: Ambulatory Visit | Attending: Urology

## 2012-05-25 ENCOUNTER — Ambulatory Visit (HOSPITAL_COMMUNITY): Payer: Medicare Other | Admitting: Anesthesiology

## 2012-05-25 DIAGNOSIS — N179 Acute kidney failure, unspecified: Secondary | ICD-10-CM | POA: Diagnosis not present

## 2012-05-25 DIAGNOSIS — K219 Gastro-esophageal reflux disease without esophagitis: Secondary | ICD-10-CM | POA: Insufficient documentation

## 2012-05-25 DIAGNOSIS — E785 Hyperlipidemia, unspecified: Secondary | ICD-10-CM | POA: Insufficient documentation

## 2012-05-25 DIAGNOSIS — C672 Malignant neoplasm of lateral wall of bladder: Secondary | ICD-10-CM | POA: Diagnosis not present

## 2012-05-25 DIAGNOSIS — I252 Old myocardial infarction: Secondary | ICD-10-CM | POA: Insufficient documentation

## 2012-05-25 DIAGNOSIS — N390 Urinary tract infection, site not specified: Secondary | ICD-10-CM | POA: Diagnosis not present

## 2012-05-25 DIAGNOSIS — C679 Malignant neoplasm of bladder, unspecified: Secondary | ICD-10-CM | POA: Diagnosis not present

## 2012-05-25 DIAGNOSIS — F29 Unspecified psychosis not due to a substance or known physiological condition: Secondary | ICD-10-CM | POA: Diagnosis not present

## 2012-05-25 DIAGNOSIS — E039 Hypothyroidism, unspecified: Secondary | ICD-10-CM | POA: Insufficient documentation

## 2012-05-25 DIAGNOSIS — I2510 Atherosclerotic heart disease of native coronary artery without angina pectoris: Secondary | ICD-10-CM | POA: Insufficient documentation

## 2012-05-25 DIAGNOSIS — C669 Malignant neoplasm of unspecified ureter: Secondary | ICD-10-CM | POA: Insufficient documentation

## 2012-05-25 DIAGNOSIS — N189 Chronic kidney disease, unspecified: Secondary | ICD-10-CM | POA: Insufficient documentation

## 2012-05-25 DIAGNOSIS — I509 Heart failure, unspecified: Secondary | ICD-10-CM | POA: Insufficient documentation

## 2012-05-25 DIAGNOSIS — D494 Neoplasm of unspecified behavior of bladder: Secondary | ICD-10-CM

## 2012-05-25 DIAGNOSIS — I129 Hypertensive chronic kidney disease with stage 1 through stage 4 chronic kidney disease, or unspecified chronic kidney disease: Secondary | ICD-10-CM | POA: Insufficient documentation

## 2012-05-25 DIAGNOSIS — I4891 Unspecified atrial fibrillation: Secondary | ICD-10-CM | POA: Insufficient documentation

## 2012-05-25 HISTORY — PX: CYSTOSCOPY W/ RETROGRADES: SHX1426

## 2012-05-25 HISTORY — PX: TRANSURETHRAL RESECTION OF BLADDER TUMOR: SHX2575

## 2012-05-25 SURGERY — TURBT (TRANSURETHRAL RESECTION OF BLADDER TUMOR)
Anesthesia: General

## 2012-05-25 SURGERY — Surgical Case
Anesthesia: *Unknown

## 2012-05-25 MED ORDER — CIPROFLOXACIN IN D5W 400 MG/200ML IV SOLN
INTRAVENOUS | Status: AC
  Filled 2012-05-25: qty 200

## 2012-05-25 MED ORDER — ONDANSETRON HCL 4 MG/2ML IJ SOLN
INTRAMUSCULAR | Status: DC | PRN
  Administered 2012-05-25: 4 mg via INTRAVENOUS

## 2012-05-25 MED ORDER — HYOSCYAMINE SULFATE 0.125 MG PO TABS: 0.1250 mg | ORAL_TABLET | ORAL | Status: DC | PRN

## 2012-05-25 MED ORDER — CIPROFLOXACIN HCL 500 MG PO TABS: 500.0000 mg | ORAL_TABLET | Freq: Two times a day (BID) | ORAL | Status: DC

## 2012-05-25 MED ORDER — IOHEXOL 300 MG/ML  SOLN
INTRAMUSCULAR | Status: DC | PRN
  Administered 2012-05-25: 10 mL

## 2012-05-25 MED ORDER — OXYBUTYNIN CHLORIDE 5 MG PO TABS: 5.0000 mg | ORAL_TABLET | Freq: Four times a day (QID) | ORAL | Status: DC | PRN

## 2012-05-25 MED ORDER — IOHEXOL 300 MG/ML  SOLN
INTRAMUSCULAR | Status: AC
  Filled 2012-05-25: qty 1

## 2012-05-25 MED ORDER — OXYCODONE HCL 5 MG PO TABS: 5.0000 mg | ORAL_TABLET | Freq: Once | ORAL | Status: DC | PRN

## 2012-05-25 MED ORDER — PROMETHAZINE HCL 25 MG/ML IJ SOLN: 6.2500 mg | INTRAMUSCULAR | Status: DC | PRN

## 2012-05-25 MED ORDER — INDIGOTINDISULFONATE SODIUM 8 MG/ML IJ SOLN
INTRAMUSCULAR | Status: AC
  Filled 2012-05-25: qty 5

## 2012-05-25 MED ORDER — BACITRACIN-NEOMYCIN-POLYMYXIN 400-5-5000 EX OINT: TOPICAL_OINTMENT | Freq: Three times a day (TID) | CUTANEOUS | Status: DC

## 2012-05-25 MED ORDER — FENTANYL CITRATE 0.05 MG/ML IJ SOLN
INTRAMUSCULAR | Status: DC | PRN
  Administered 2012-05-25: 25 ug via INTRAVENOUS
  Administered 2012-05-25: 50 ug via INTRAVENOUS
  Administered 2012-05-25: 25 ug via INTRAVENOUS

## 2012-05-25 MED ORDER — ACETAMINOPHEN 10 MG/ML IV SOLN: 1000.0000 mg | Freq: Once | INTRAVENOUS | Status: DC | PRN

## 2012-05-25 MED ORDER — ACETAMINOPHEN 10 MG/ML IV SOLN
INTRAVENOUS | Status: AC
  Filled 2012-05-25: qty 100

## 2012-05-25 MED ORDER — ROCURONIUM BROMIDE 100 MG/10ML IV SOLN
INTRAVENOUS | Status: DC | PRN
  Administered 2012-05-25: 10 mg via INTRAVENOUS
  Administered 2012-05-25: 35 mg via INTRAVENOUS

## 2012-05-25 MED ORDER — HYDROCODONE-ACETAMINOPHEN 5-325 MG PO TABS: 1.0000 | ORAL_TABLET | ORAL | Status: DC | PRN

## 2012-05-25 MED ORDER — OXYCODONE HCL 5 MG PO TABS
5.0000 mg | ORAL_TABLET | Freq: Once | ORAL | Status: AC | PRN
  Administered 2012-05-25: 5 mg via ORAL
  Filled 2012-05-25: qty 1

## 2012-05-25 MED ORDER — LIDOCAINE HCL (CARDIAC) 20 MG/ML IV SOLN
INTRAVENOUS | Status: DC | PRN
  Administered 2012-05-25: 40 mg via INTRAVENOUS

## 2012-05-25 MED ORDER — HYDROMORPHONE HCL PF 1 MG/ML IJ SOLN: 0.2500 mg | INTRAMUSCULAR | Status: DC | PRN

## 2012-05-25 MED ORDER — PHENYLEPHRINE HCL 10 MG/ML IJ SOLN
INTRAMUSCULAR | Status: DC | PRN
  Administered 2012-05-25 (×4): 80 ug via INTRAVENOUS

## 2012-05-25 MED ORDER — FENTANYL CITRATE 0.05 MG/ML IJ SOLN
INTRAMUSCULAR | Status: AC
  Filled 2012-05-25: qty 2

## 2012-05-25 MED ORDER — ACETAMINOPHEN 10 MG/ML IV SOLN
INTRAVENOUS | Status: DC | PRN
  Administered 2012-05-25: 1000 mg via INTRAVENOUS

## 2012-05-25 MED ORDER — LIDOCAINE HCL 4 % MT SOLN
OROMUCOSAL | Status: DC | PRN
  Administered 2012-05-25: 4 mL via TOPICAL

## 2012-05-25 MED ORDER — 0.9 % SODIUM CHLORIDE (POUR BTL) OPTIME
TOPICAL | Status: DC | PRN
  Administered 2012-05-25: 1000 mL

## 2012-05-25 MED ORDER — SODIUM CHLORIDE 0.9 % IV SOLN
INTRAVENOUS | Status: DC | PRN
  Administered 2012-05-25: 13:00:00 via INTRAVENOUS

## 2012-05-25 MED ORDER — LIDOCAINE HCL 2 % EX GEL
CUTANEOUS | Status: AC
  Filled 2012-05-25: qty 10

## 2012-05-25 MED ORDER — BELLADONNA ALKALOIDS-OPIUM 16.2-60 MG RE SUPP
RECTAL | Status: DC | PRN
  Administered 2012-05-25: 1 via RECTAL

## 2012-05-25 MED ORDER — OXYCODONE HCL 5 MG/5ML PO SOLN: 5.0000 mg | Freq: Once | ORAL | Status: DC | PRN

## 2012-05-25 MED ORDER — NEOSTIGMINE METHYLSULFATE 1 MG/ML IJ SOLN
INTRAMUSCULAR | Status: DC | PRN
  Administered 2012-05-25: 3 mg via INTRAVENOUS

## 2012-05-25 MED ORDER — MEPERIDINE HCL 50 MG/ML IJ SOLN: 6.2500 mg | INTRAMUSCULAR | Status: DC | PRN

## 2012-05-25 MED ORDER — CIPROFLOXACIN IN D5W 400 MG/200ML IV SOLN
400.0000 mg | INTRAVENOUS | Status: AC
  Administered 2012-05-25: 400 mg via INTRAVENOUS

## 2012-05-25 MED ORDER — HYDROMORPHONE HCL PF 1 MG/ML IJ SOLN
0.2500 mg | INTRAMUSCULAR | Status: DC | PRN
  Administered 2012-05-25 (×4): 0.5 mg via INTRAVENOUS

## 2012-05-25 MED ORDER — HYDROMORPHONE HCL PF 1 MG/ML IJ SOLN
INTRAMUSCULAR | Status: AC
  Filled 2012-05-25: qty 1

## 2012-05-25 MED ORDER — SODIUM CHLORIDE 0.9 % IR SOLN
Status: DC | PRN
  Administered 2012-05-25: 16000 mL

## 2012-05-25 MED ORDER — PROPOFOL 10 MG/ML IV EMUL
INTRAVENOUS | Status: DC | PRN
  Administered 2012-05-25: 80 mg via INTRAVENOUS

## 2012-05-25 MED ORDER — LACTATED RINGERS IV SOLN
INTRAVENOUS | Status: DC
  Administered 2012-05-25: 15:00:00 via INTRAVENOUS
  Administered 2012-05-25: 1000 mL via INTRAVENOUS

## 2012-05-25 MED ORDER — BELLADONNA ALKALOIDS-OPIUM 16.2-60 MG RE SUPP
RECTAL | Status: AC
  Filled 2012-05-25: qty 1

## 2012-05-25 MED ORDER — OXYCODONE HCL 5 MG/5ML PO SOLN
5.0000 mg | Freq: Once | ORAL | Status: AC | PRN
  Filled 2012-05-25: qty 5

## 2012-05-25 MED ORDER — STERILE WATER FOR IRRIGATION IR SOLN
Status: DC | PRN
  Administered 2012-05-25: 500 mL

## 2012-05-25 MED ORDER — PHENAZOPYRIDINE HCL 100 MG PO TABS: 100.0000 mg | ORAL_TABLET | Freq: Three times a day (TID) | ORAL | Status: DC | PRN

## 2012-05-25 MED ORDER — GLYCOPYRROLATE 0.2 MG/ML IJ SOLN
INTRAMUSCULAR | Status: DC | PRN
  Administered 2012-05-25: 0.4 mg via INTRAVENOUS

## 2012-05-25 SURGICAL SUPPLY — 35 items
ADAPTER CATH URET PLST 4-6FR (CATHETERS) IMPLANT
BAG URINE DRAINAGE (UROLOGICAL SUPPLIES) IMPLANT
BAG URO CATCHER STRL LF (DRAPE) ×4 IMPLANT
BASKET ZERO TIP NITINOL 2.4FR (BASKET) IMPLANT
CATH HEMA 3WAY 30CC 24FR COUDE (CATHETERS) ×4 IMPLANT
CATH INTERMIT  6FR 70CM (CATHETERS) IMPLANT
CATH URET 5FR 28IN CONE TIP (BALLOONS) ×1
CATH URET 5FR 28IN OPEN ENDED (CATHETERS) ×4 IMPLANT
CATH URET 5FR 70CM CONE TIP (BALLOONS) ×3 IMPLANT
CLOTH BEACON ORANGE TIMEOUT ST (SAFETY) ×4 IMPLANT
DRAPE CAMERA CLOSED 9X96 (DRAPES) ×4 IMPLANT
ELECT LOOP MED HF 24F 12D CBL (CLIP) ×4 IMPLANT
ELECT REM PT RETURN 9FT ADLT (ELECTROSURGICAL)
ELECTRODE REM PT RTRN 9FT ADLT (ELECTROSURGICAL) IMPLANT
EVACUATOR MICROVAS BLADDER (UROLOGICAL SUPPLIES) IMPLANT
GLOVE BIOGEL M STRL SZ7.5 (GLOVE) IMPLANT
GLOVE BIOGEL PI IND STRL 7.0 (GLOVE) ×3 IMPLANT
GLOVE BIOGEL PI IND STRL 7.5 (GLOVE) IMPLANT
GLOVE BIOGEL PI INDICATOR 7.0 (GLOVE) ×1
GLOVE BIOGEL PI INDICATOR 7.5 (GLOVE)
GLOVE ECLIPSE 7.0 STRL STRAW (GLOVE) IMPLANT
GLOVE ECLIPSE 7.5 STRL STRAW (GLOVE) IMPLANT
GOWN PREVENTION PLUS XLARGE (GOWN DISPOSABLE) ×4 IMPLANT
GOWN STRL NON-REIN LRG LVL3 (GOWN DISPOSABLE) IMPLANT
GUIDEWIRE ANG ZIPWIRE 038X150 (WIRE) IMPLANT
GUIDEWIRE STR DUAL SENSOR (WIRE) ×4 IMPLANT
KIT ASPIRATION TUBING (SET/KITS/TRAYS/PACK) ×4 IMPLANT
LASER FIBER DISP (UROLOGICAL SUPPLIES) IMPLANT
LOOPS RESECTOSCOPE DISP (ELECTROSURGICAL) IMPLANT
MANIFOLD NEPTUNE II (INSTRUMENTS) ×4 IMPLANT
PACK CYSTO (CUSTOM PROCEDURE TRAY) ×4 IMPLANT
PLUG CATH AND CAP STER (CATHETERS) ×4 IMPLANT
SYR 20CC LL (SYRINGE) ×4 IMPLANT
SYRINGE IRR TOOMEY STRL 70CC (SYRINGE) ×4 IMPLANT
TUBING CONNECTING 10 (TUBING) ×4 IMPLANT

## 2012-05-25 NOTE — H&P (Signed)
Urology History and Physical Exam  CC: Bladder tumor  HPI: 78 year old male with a bladder tumor presents for cystoscopy, transurethral resection of bladder tumor, bilateral retrograde pyelogram, possible left ureteral stent placement, possible left ureteroscopy with biopsy. This was discovered on office cystoscopy for hematuria. The tumor is overlying the left ureter orifice. He has a personal history of smoking for 45 years; he quit 7 years ago.  He has baseline urinary symptoms nocturia 3 times per night and a weak urinary stream. He has baseline renal insufficiency. UA April 29, 2012 from his PCPs office revealed too numerous to count RBCs, negative WBCs/leukocyte esterase/nitrite.  CT scan of the abdomen and pelvis without contrast on April 29, 2012 was performed through [facility]. I have the report which shows no stones or hydronephrosis bilaterally. There are small pre-caval and periportal lymph nodes present.   We have discussed the risks, benefits, alternatives, and likelihood of achieving goals. The patient was cleared by Dr. Haroldine Laws to be off plavix and aspirin for the surgery.   PMH: Past Medical History  Diagnosis Date  . CAD (coronary artery disease)      a. s/p anterior MI 12/05 c/b shock -> stent LAD   b. s/p stenting OM-1, 2/06  . CHF (congestive heart failure)     due to ischemic CM  a. EF 20-30%. (Nov 2008)   b. s/p St. Jude BiV-ICD    c. CPX 07/2008  pvo2 16.3 (63% predicted) slope 34 RER 1.08 O2 pulse 93%  . Atrial fibrillation or flutter     maintaining sinus on amiodarone  --PFTS, TFTs ok 8/10  . CRI (chronic renal insufficiency)     (baseline 2.0-2.2)  . Hyperlipidemia   . Hypothyroidism   . GERD (gastroesophageal reflux disease)     hx Barretts esophagitis  . Skin cancer     basal cells   facial x 4, 1 right forearm  . Anemia   . 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 Myocardial infarction 09/15/2004  . History  of blood transfusion     following coumadin  usage  . HTN (hypertension)     EKG 4/13 EPIC, LOV Dr Ronda Fairly with note 05/18/12,  pre Dr Caryl Comes- "stable cardiac perspective" with ICD orders on chart, last interrogation 05/14/12 EPIC  . Cough     x 1 month- productive, no fever  . ICD (implantable cardiac defibrillator) in place     BiV/ICD    PSH: Past Surgical History  Procedure Date  . Cervical laminectomy 1985  . Back surgery 1972    lower back  . Cardiac defibrillator placement     ICD St Jude  . Hernia repair 1989    bilateral  . Esophagogastroduodenoscopy   . Colonoscopy   . Coronary angioplasty [id number]    Allergies: Allergies  Allergen Reactions  . Amoxicillin     REACTION: rash  . Ramipril     REACTION: cough  . Penicillins Rash    Medications: No prescriptions prior to admission     Social History: History   Social History  . Marital Status: Married    Spouse Name: N/A    Number of Children: N/A  . Years of Education: N/A   Occupational History  . retired    Social History Main Topics  . Smoking status: Former Smoker    Types: Cigarettes, Cigars    Quit date: 03/02/2004  . Smokeless tobacco: Former Systems developer  Quit date: 05/20/1999   Comment: quit in 2005  . Alcohol Use: No  . Drug Use: No  . Sexually Active: Not on file   Other Topics Concern  . Not on file   Social History Narrative  . No narrative on file    Family History: Family History  Problem Relation Age of Onset  . Coronary artery disease    . Stroke      Review of Systems: Positive: Chest discomfort relieved with belching.  Negative: Fever, SOB, nausea.  A further 10 point review of systems was negative except what is listed in the HPI.  Physical Exam:  General: No acute distress.  Awake. Head:  Normocephalic.  Atraumatic. ENT:  EOMI.  Mucous membranes moist Neck:  Supple.  No lymphadenopathy. CV:  S1 present. S2 present. Regular rate. Pulmonary: Equal effort  bilaterally.  Clear to auscultation bilaterally. Abdomen: Soft.  Non- tender to palpation. Skin:  Normal turgor.  No visible rash. Extremity: No gross deformity of bilateral upper extremities.  No gross deformity of    bilateral lower extremities. Neurologic: Alert. Appropriate mood.   Studies:  No results found for this basename: HGB:2,WBC:2,PLT:2 in the last 72 hours  No results found for this basename: NA:2,K:2,CL:2,CO2:2,BUN:2,CREATININE:2,CALCIUM:2,MAGNESIUM:2,GFRNONAA:2,GFRAA:2 in the last 72 hours   No results found for this basename: PT:2,INR:2,APTT:2 in the last 72 hours   No components found with this basename: ABG:2    Assessment:  Bladder tumor  Plan: -To OR for cystoscopy, transurethral resection of bladder tumor, bilateral retrograde pyelogram, possible left ureteral stent placement, possible left ureteroscopy with biopsy.

## 2012-05-25 NOTE — Transfer of Care (Signed)
Immediate Anesthesia Transfer of Care Note  Patient: [name]  Procedure(s) Performed: Procedure(s) (LRB): TRANSURETHRAL RESECTION OF BLADDER TUMOR (TURBT) (N/A) CYSTOSCOPY WITH RETROGRADE PYELOGRAM (Bilateral) CYSTOSCOPY WITH STENT PLACEMENT (Bilateral)  Patient Location: PACU  Anesthesia Type: General  Level of Consciousness: awake, alert , oriented and patient cooperative  Airway & Oxygen Therapy: Patient Spontanous Breathing and Patient connected to face mask oxygen  Post-op Assessment: Report given to PACU RN, Post -op Vital signs reviewed and stable and Patient moving all extremities X 4  Post vital signs: stable  Complications: No apparent anesthesia complications

## 2012-05-25 NOTE — Brief Op Note (Signed)
05/25/2012  2:26 PM  PATIENT:  [name]  78 y.o. male  PRE-OPERATIVE DIAGNOSIS:  BLADDER TUMOR  POST-OPERATIVE DIAGNOSIS:  Bladder Tumor  PROCEDURE:  Procedure(s) (LRB): TRANSURETHRAL RESECTION OF BLADDER TUMOR (TURBT) (N/A) CYSTOSCOPY WITH RETROGRADE PYELOGRAM (Bilateral) CYSTOSCOPY WITH STENT PLACEMENT (Bilateral)  SURGEON:  Surgeon(s) and Role:    * Molli Hazard, MD - Primary  PHYSICIAN ASSISTANT:   ASSISTANTS: none   ANESTHESIA:   general  EBL:  Total I/O In: 1150 [I.V.:1150] Out: - Minimal  BLOOD ADMINISTERED:none  DRAINS: Urinary Catheter (Foley)   LOCAL MEDICATIONS USED:  OTHER B&O suppository.  SPECIMEN:  Source of Specimen:  Bladder tumor.  DISPOSITION OF SPECIMEN:  PATHOLOGY  COUNTS:  YES  TOURNIQUET:  * No tourniquets in log *  DICTATION: .Other Dictation: Dictation Number [id number]  PLAN OF CARE: Discharge to home after PACU  PATIENT DISPOSITION:  PACU - hemodynamically stable.   Delay start of Pharmacological VTE agent (>24hrs) due to surgical blood loss or risk of bleeding: yes

## 2012-05-25 NOTE — Anesthesia Postprocedure Evaluation (Signed)
Anesthesia Post Note  Patient: [name]  Procedure(s) Performed: Procedure(s) (LRB): TRANSURETHRAL RESECTION OF BLADDER TUMOR (TURBT) (N/A) CYSTOSCOPY WITH RETROGRADE PYELOGRAM (Bilateral) CYSTOSCOPY WITH STENT PLACEMENT (Bilateral)  Anesthesia type: General  Patient location: PACU  Post pain: Pain level controlled  Post assessment: Post-op Vital signs reviewed  Last Vitals: BP 119/69  Pulse 77  Temp 36.8 C (Oral)  Resp 16  SpO2 97%  Post vital signs: Reviewed  Level of consciousness: sedated  Complications: No apparent anesthesia complications

## 2012-05-26 ENCOUNTER — Emergency Department (HOSPITAL_COMMUNITY): Payer: Medicare Other

## 2012-05-26 ENCOUNTER — Encounter (HOSPITAL_COMMUNITY): Payer: Self-pay | Admitting: Urology

## 2012-05-26 ENCOUNTER — Inpatient Hospital Stay (HOSPITAL_COMMUNITY)
Admission: EM | Admit: 2012-05-26 | Discharge: 2012-05-28 | DRG: 669 | Disposition: A | Discharge status: Home Health | Payer: Medicare Other | Attending: Internal Medicine | Admitting: Internal Medicine

## 2012-05-26 DIAGNOSIS — I252 Old myocardial infarction: Secondary | ICD-10-CM | POA: Diagnosis not present

## 2012-05-26 DIAGNOSIS — E039 Hypothyroidism, unspecified: Secondary | ICD-10-CM | POA: Diagnosis present

## 2012-05-26 DIAGNOSIS — I4891 Unspecified atrial fibrillation: Secondary | ICD-10-CM | POA: Diagnosis present

## 2012-05-26 DIAGNOSIS — Z95810 Presence of automatic (implantable) cardiac defibrillator: Secondary | ICD-10-CM | POA: Diagnosis not present

## 2012-05-26 DIAGNOSIS — D6489 Other specified anemias: Secondary | ICD-10-CM | POA: Diagnosis present

## 2012-05-26 DIAGNOSIS — K5900 Constipation, unspecified: Secondary | ICD-10-CM | POA: Diagnosis present

## 2012-05-26 DIAGNOSIS — Z79899 Other long term (current) drug therapy: Secondary | ICD-10-CM | POA: Diagnosis not present

## 2012-05-26 DIAGNOSIS — C679 Malignant neoplasm of bladder, unspecified: Secondary | ICD-10-CM | POA: Diagnosis not present

## 2012-05-26 DIAGNOSIS — N179 Acute kidney failure, unspecified: Secondary | ICD-10-CM

## 2012-05-26 DIAGNOSIS — E860 Dehydration: Secondary | ICD-10-CM | POA: Diagnosis present

## 2012-05-26 DIAGNOSIS — N19 Unspecified kidney failure: Secondary | ICD-10-CM

## 2012-05-26 DIAGNOSIS — R109 Unspecified abdominal pain: Secondary | ICD-10-CM | POA: Diagnosis not present

## 2012-05-26 DIAGNOSIS — K5730 Diverticulosis of large intestine without perforation or abscess without bleeding: Secondary | ICD-10-CM | POA: Diagnosis not present

## 2012-05-26 DIAGNOSIS — Z8551 Personal history of malignant neoplasm of bladder: Secondary | ICD-10-CM | POA: Diagnosis not present

## 2012-05-26 DIAGNOSIS — N390 Urinary tract infection, site not specified: Secondary | ICD-10-CM | POA: Diagnosis present

## 2012-05-26 DIAGNOSIS — Z87891 Personal history of nicotine dependence: Secondary | ICD-10-CM

## 2012-05-26 DIAGNOSIS — R142 Eructation: Secondary | ICD-10-CM | POA: Diagnosis not present

## 2012-05-26 DIAGNOSIS — F29 Unspecified psychosis not due to a substance or known physiological condition: Secondary | ICD-10-CM | POA: Diagnosis present

## 2012-05-26 DIAGNOSIS — E785 Hyperlipidemia, unspecified: Secondary | ICD-10-CM | POA: Diagnosis present

## 2012-05-26 DIAGNOSIS — N189 Chronic kidney disease, unspecified: Secondary | ICD-10-CM | POA: Diagnosis present

## 2012-05-26 DIAGNOSIS — D494 Neoplasm of unspecified behavior of bladder: Secondary | ICD-10-CM | POA: Diagnosis present

## 2012-05-26 DIAGNOSIS — R509 Fever, unspecified: Secondary | ICD-10-CM | POA: Diagnosis not present

## 2012-05-26 DIAGNOSIS — R410 Disorientation, unspecified: Secondary | ICD-10-CM

## 2012-05-26 DIAGNOSIS — I2510 Atherosclerotic heart disease of native coronary artery without angina pectoris: Secondary | ICD-10-CM | POA: Diagnosis not present

## 2012-05-26 DIAGNOSIS — I129 Hypertensive chronic kidney disease with stage 1 through stage 4 chronic kidney disease, or unspecified chronic kidney disease: Secondary | ICD-10-CM | POA: Diagnosis present

## 2012-05-26 DIAGNOSIS — R1084 Generalized abdominal pain: Secondary | ICD-10-CM | POA: Diagnosis not present

## 2012-05-26 DIAGNOSIS — R4182 Altered mental status, unspecified: Secondary | ICD-10-CM | POA: Diagnosis not present

## 2012-05-26 HISTORY — DX: Malignant neoplasm of bladder, unspecified: C67.9

## 2012-05-26 LAB — BASIC METABOLIC PANEL
CO2: 24 mEq/L (ref 19–32)
Chloride: 101 mEq/L (ref 96–112)
Creatinine, Ser: 2.83 mg/dL — ABNORMAL HIGH (ref 0.50–1.35)
Potassium: 4.4 mEq/L (ref 3.5–5.1)

## 2012-05-26 LAB — CBC WITH DIFFERENTIAL/PLATELET
Basophils Absolute: 0 10*3/uL (ref 0.0–0.1)
HCT: 37.2 % — ABNORMAL LOW (ref 39.0–52.0)
Hemoglobin: 12.1 g/dL — ABNORMAL LOW (ref 13.0–17.0)
Lymphocytes Relative: 10 % — ABNORMAL LOW (ref 12–46)
Monocytes Absolute: 1.5 10*3/uL — ABNORMAL HIGH (ref 0.1–1.0)
Neutro Abs: 10.5 10*3/uL — ABNORMAL HIGH (ref 1.7–7.7)
RDW: 13.7 % (ref 11.5–15.5)
WBC: 13.4 10*3/uL — ABNORMAL HIGH (ref 4.0–10.5)

## 2012-05-26 LAB — URINALYSIS, ROUTINE W REFLEX MICROSCOPIC
Glucose, UA: NEGATIVE mg/dL
Protein, ur: >300 mg/dL — AB
pH: 5 (ref 5.0–8.0)

## 2012-05-26 MED ORDER — PHENAZOPYRIDINE HCL 100 MG PO TABS
100.0000 mg | ORAL_TABLET | Freq: Three times a day (TID) | ORAL | Status: DC
  Administered 2012-05-27 (×2): 100 mg via ORAL
  Filled 2012-05-26 (×5): qty 1

## 2012-05-26 MED ORDER — DIGOXIN 250 MCG PO TABS
0.2500 mg | ORAL_TABLET | Freq: Every morning | ORAL | Status: DC
  Administered 2012-05-27 – 2012-05-28 (×2): 0.25 mg via ORAL
  Filled 2012-05-26 (×2): qty 1

## 2012-05-26 MED ORDER — ACETAMINOPHEN 325 MG PO TABS
650.0000 mg | ORAL_TABLET | Freq: Four times a day (QID) | ORAL | Status: DC | PRN
  Administered 2012-05-27 (×3): 650 mg via ORAL
  Filled 2012-05-26 (×3): qty 2

## 2012-05-26 MED ORDER — BACITRACIN-NEOMYCIN-POLYMYXIN OINTMENT TUBE
1.0000 "application " | TOPICAL_OINTMENT | Freq: Three times a day (TID) | CUTANEOUS | Status: DC
  Administered 2012-05-27 – 2012-05-28 (×6): 1 via TOPICAL
  Filled 2012-05-26 (×3): qty 1

## 2012-05-26 MED ORDER — ACETAMINOPHEN 325 MG PO TABS
650.0000 mg | ORAL_TABLET | Freq: Once | ORAL | Status: AC
  Administered 2012-05-26: 650 mg via ORAL
  Filled 2012-05-26: qty 2

## 2012-05-26 MED ORDER — SODIUM CHLORIDE 0.9 % IV SOLN
INTRAVENOUS | Status: DC
  Administered 2012-05-27 (×2): via INTRAVENOUS

## 2012-05-26 MED ORDER — IRBESARTAN 75 MG PO TABS
75.0000 mg | ORAL_TABLET | Freq: Every day | ORAL | Status: DC
  Administered 2012-05-27 – 2012-05-28 (×2): 75 mg via ORAL
  Filled 2012-05-26 (×2): qty 1

## 2012-05-26 MED ORDER — ATORVASTATIN CALCIUM 40 MG PO TABS
40.0000 mg | ORAL_TABLET | Freq: Every day | ORAL | Status: DC
  Administered 2012-05-27: 40 mg via ORAL
  Filled 2012-05-26 (×2): qty 1

## 2012-05-26 MED ORDER — NITROGLYCERIN 0.4 MG SL SUBL: 0.4000 mg | SUBLINGUAL_TABLET | SUBLINGUAL | Status: DC | PRN

## 2012-05-26 MED ORDER — SODIUM CHLORIDE 0.9 % IV SOLN
Freq: Once | INTRAVENOUS | Status: AC
  Administered 2012-05-26: 23:00:00 via INTRAVENOUS

## 2012-05-26 MED ORDER — FERROUS SULFATE 325 (65 FE) MG PO TABS
325.0000 mg | ORAL_TABLET | ORAL | Status: DC
  Administered 2012-05-27: 325 mg via ORAL
  Filled 2012-05-26 (×3): qty 1

## 2012-05-26 MED ORDER — PANTOPRAZOLE SODIUM 40 MG PO TBEC
40.0000 mg | DELAYED_RELEASE_TABLET | Freq: Every day | ORAL | Status: DC
  Administered 2012-05-27 – 2012-05-28 (×2): 40 mg via ORAL
  Filled 2012-05-26 (×4): qty 1

## 2012-05-26 MED ORDER — LEVOTHYROXINE SODIUM 50 MCG PO TABS
50.0000 ug | ORAL_TABLET | Freq: Every day | ORAL | Status: DC
  Administered 2012-05-27 – 2012-05-28 (×2): 50 ug via ORAL
  Filled 2012-05-26 (×5): qty 1

## 2012-05-26 MED ORDER — CIPROFLOXACIN HCL 250 MG PO TABS
250.0000 mg | ORAL_TABLET | Freq: Two times a day (BID) | ORAL | Status: DC
  Administered 2012-05-27 (×3): 250 mg via ORAL
  Filled 2012-05-26 (×4): qty 1
  Filled 2012-05-26: qty 2
  Filled 2012-05-26: qty 1

## 2012-05-26 MED ORDER — CARVEDILOL 6.25 MG PO TABS
6.2500 mg | ORAL_TABLET | Freq: Two times a day (BID) | ORAL | Status: DC
  Administered 2012-05-27 – 2012-05-28 (×4): 6.25 mg via ORAL
  Filled 2012-05-26 (×6): qty 1

## 2012-05-26 MED ORDER — AMIODARONE HCL 100 MG PO TABS
100.0000 mg | ORAL_TABLET | Freq: Every day | ORAL | Status: DC
  Administered 2012-05-27 – 2012-05-28 (×2): 100 mg via ORAL
  Filled 2012-05-26 (×4): qty 1

## 2012-05-26 MED ORDER — ACETAMINOPHEN 650 MG RE SUPP: 650.0000 mg | Freq: Four times a day (QID) | RECTAL | Status: DC | PRN

## 2012-05-26 MED ORDER — EZETIMIBE 10 MG PO TABS
10.0000 mg | ORAL_TABLET | Freq: Every day | ORAL | Status: DC
  Administered 2012-05-27 – 2012-05-28 (×2): 10 mg via ORAL
  Filled 2012-05-26 (×2): qty 1

## 2012-05-26 MED ORDER — FUROSEMIDE 20 MG PO TABS
20.0000 mg | ORAL_TABLET | Freq: Every day | ORAL | Status: DC
  Filled 2012-05-26 (×2): qty 1

## 2012-05-26 MED ORDER — DEXTROSE 5 % IV SOLN
1.0000 g | INTRAVENOUS | Status: DC
  Administered 2012-05-27 – 2012-05-28 (×2): 1 g via INTRAVENOUS
  Filled 2012-05-26 (×2): qty 10

## 2012-05-26 MED ORDER — SODIUM CHLORIDE 0.9 % IV BOLUS (SEPSIS)
500.0000 mL | Freq: Once | INTRAVENOUS | Status: AC
  Administered 2012-05-26: 500 mL via INTRAVENOUS

## 2012-05-26 MED ORDER — ADULT MULTIVITAMIN W/MINERALS CH
0.5000 | ORAL_TABLET | Freq: Two times a day (BID) | ORAL | Status: DC
  Administered 2012-05-27 – 2012-05-28 (×4): 1 via ORAL
  Filled 2012-05-26 (×5): qty 1

## 2012-05-26 NOTE — H&P (Addendum)
[name] is an 78 y.o. male.   PCP:   [name], MD   Chief Complaint:  Confusion, fever, and ARF 1 day post op for Bladder tumor  HPI: 51 Male with MMP who is one day post op per Uro Dr  [name] for Cystoscopy, Transurethral resection of bladder tumor larger than 2 cm less than 5 cm, Bilateral retrograde pyelogram, Bilateral ureteral stent placement and Fluoroscopy.  He was D/ced post op.  He presented to the ED with confusion and messing with the foley.  He was found to have temp 101 rectal, Dehydration and ARF.  Urology was called first to admit but felt the pt would benefit from medicine admit.  He will need IVF and ABx.  He had a bladder scan showing 80-100ccs of urine and not obstructed.  Other issues are blood on his legs and intermittent pain in his lower abdomen.   Currently a little off despite looking good and will be admitted.     Past Medical History:  Past Medical History  Diagnosis Date  . CAD (coronary artery disease)      a. s/p anterior MI 12/05 c/b shock -> stent LAD   b. s/p stenting OM-1, 2/06  . CHF (congestive heart failure)     due to ischemic CM  a. EF 20-30%. (Nov 2008)   b. s/p St. Jude BiV-ICD    c. CPX 07/2008  pvo2 16.3 (63% predicted) slope 34 RER 1.08 O2 pulse 93%  . Atrial fibrillation or flutter     maintaining sinus on amiodarone  --PFTS, TFTs ok 8/10  . CRI (chronic renal insufficiency)     (baseline 2.0-2.2)  . Hyperlipidemia   . Hypothyroidism   . GERD (gastroesophageal reflux disease)     hx Barretts esophagitis  . Skin cancer     basal cells   facial x 4, 1 right forearm  . Anemia   . 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 Myocardial infarction 09/15/2004  . History of blood transfusion     following coumadin  usage  . HTN (hypertension)     EKG 4/13 EPIC, LOV Dr Ronda Fairly with note 05/18/12,  pre Dr Caryl Comes- "stable cardiac perspective" with ICD orders on chart, last interrogation  05/14/12 EPIC  . Cough     x 1 month- productive, no fever  . ICD (implantable cardiac defibrillator) in place     BiV/ICD    Past Surgical History  Procedure Date  . Cervical laminectomy 1985  . Back surgery 1972    lower back  . Cardiac defibrillator placement     ICD St Jude  . Hernia repair 1989    bilateral  . Esophagogastroduodenoscopy   . Colonoscopy   . Coronary angioplasty [id number]  . Transurethral resection of bladder tumor 05/25/2012    Procedure: TRANSURETHRAL RESECTION OF BLADDER TUMOR (TURBT);  Surgeon: Molli Hazard, MD;  Location: [facility];  Service: Urology;  Laterality: N/A;  Cystoscopy, TURBT, Cold cup biospy of bladder    . Cystoscopy w/ retrogrades 05/25/2012    Procedure: CYSTOSCOPY WITH RETROGRADE PYELOGRAM;  Surgeon: Molli Hazard, MD;  Location: [facility];  Service: Urology;  Laterality: Bilateral;   2nd finger removal after table saw injury-1969 Lumbar laminectomy-1972, Cervical discectomy-1985, Hernia inguinal-1989, L Cataract Surgery    Allergies:   Allergies  Allergen Reactions  . Amoxicillin     REACTION: rash  . Ramipril  REACTION: cough  . Penicillins Rash     Medications: Prior to Admission medications   Medication Sig Start Date End Date Taking? Authorizing Provider  amiodarone (PACERONE) 200 MG tablet Take 100 mg by mouth daily with breakfast.    Yes Historical Provider, MD  aspirin EC 325 MG tablet Take 325 mg by mouth daily.   Yes Historical Provider, MD  carvedilol (COREG) 6.25 MG tablet Take 6.25 mg by mouth 2 (two) times daily with a meal. 07/04/11  Yes Jolaine Artist, MD  ciprofloxacin (CIPRO) 500 MG tablet Take 500 mg by mouth 2 (two) times daily. 05/25/12 06/04/12 Yes Molli Hazard, MD  clopidogrel (PLAVIX) 75 MG tablet Take 75 mg by mouth daily.   Yes Historical Provider, MD  digoxin (LANOXIN) 0.125 MG tablet Take 0.25 mg by mouth every morning. 1/2 tablet- .125 mg  tablets   Yes Historical Provider, MD   ezetimibe (ZETIA) 10 MG tablet Take 10 mg by mouth daily with supper.  08/12/11  Yes Jolaine Artist, MD  ferrous sulfate ([employer] IRON) 325 (65 FE) MG tablet Take 325 mg by mouth 3 (three) times a week. 3 times a week   Yes Historical Provider, MD  furosemide (LASIX) 20 MG tablet Take 20 mg by mouth daily with breakfast.    Yes Historical Provider, MD  HYDROcodone-acetaminophen (NORCO/VICODIN) 5-325 MG per tablet Take 1-2 tablets by mouth every 4 (four) hours as needed. For pain 05/25/12 06/04/12 Yes Molli Hazard, MD  hyoscyamine (LEVSIN, ANASPAZ) 0.125 MG tablet Take 0.125 mg by mouth every 4 (four) hours as needed. For cramps 05/25/12 06/04/12 Yes Molli Hazard, MD  levothyroxine (SYNTHROID, LEVOTHROID) 50 MCG tablet Take 50 mcg by mouth daily before breakfast.    Yes Historical Provider, MD  Multiple Vitamin (MULTIVITAMIN WITH MINERALS) TABS Take 0.5 tablets by mouth 2 (two) times daily.   Yes Historical Provider, MD  neomycin-bacitracin-polymyxin (NEOSPORIN) ointment Apply 1 application topically 3 (three) times daily. apply to tip of penis. 05/25/12 06/04/12 Yes Molli Hazard, MD  oxybutynin (DITROPAN) 5 MG tablet Take 5 mg by mouth every 6 (six) hours as needed. For bladder spasm 05/25/12 05/25/13 Yes Molli Hazard, MD  pantoprazole (PROTONIX) 40 MG tablet Take 1 tablet by mouth daily with breakfast.  01/15/11  Yes Historical Provider, MD  phenazopyridine (PYRIDIUM) 100 MG tablet Take 100 mg by mouth every 8 (eight) hours as needed. For bladder pain 05/25/12 05/28/12 Yes Molli Hazard, MD  potassium chloride (K-DUR) 10 MEQ tablet Take 10 mEq by mouth daily with breakfast.    Yes Historical Provider, MD  rosuvastatin (CRESTOR) 20 MG tablet Take 20 mg by mouth daily with supper.  08/12/11  Yes Jolaine Artist, MD  valsartan (DIOVAN) 80 MG tablet Take 40 mg by mouth 2 (two) times daily. 02/14/12 02/13/13 Yes Shaune Pascal Bensimhon, MD  nitroGLYCERIN (NITROSTAT) 0.4 MG SL  tablet Place 0.4 mg under the tongue every 5 (five) minutes as needed. For chest pain    Historical Provider, MD   Complete Medication List: 1)  Amiodarone Hcl 200 Mg Tabs (Amiodarone hcl) .... Take 1/2 tablet po qd 2)  Aspirin Ec 325 Mg Tbec (Aspirin) .... Take one (1) tablet by mouth daily 3)  Coreg 6.25 Mg Tabs (Carvedilol) .... Take 1 tablets by mouth twice daily 4)  Lasix 20 Mg Tabs (Furosemide) .... Take 1 tablet po qd 5)  Crestor 20 Mg Tabs (Rosuvastatin calcium) .Marland Kitchen.. 1 po qd 6)  Plavix  75 Mg Tabs (Clopidogrel bisulfate) .... Take 1 tablet po qd 7)  Klor-con M10 10 Meq Cr-tabs (Potassium chloride crys cr) .... Take 1 tablet po qd 8)  Zetia 10 Mg Tabs (Ezetimibe) .... Take 1 tablet po qd 9)  Nitroglycerin 0.4 Mg Subl (Nitroglycerin) 10)  Ferrous Sulfate 325 (65 Fe) Mg Tabs (Ferrous sulfate) .... Take 1 tablet po 3 x per week 11)  Diovan 80 Mg Tabs (Valsartan) .... 1/2 po bid 12)  Digoxin 0.125 Mg Tabs (Digoxin) .... Take 1/2 po qd 13)  Daily Multiple Vitamins Tabs (Multiple vitamin) 14)  Synthroid 50 Mcg Tabs (Levothyroxine sodium) .... Take one tablet by mouth daily 15)  Protonix 40 Mg Tbec (Pantoprazole sodium) .... Take one tablet by mouth once daily    (Not in a hospital admission)   Social History:  reports that he quit smoking about 8 years ago. His smoking use included Cigarettes and Cigars. He quit smokeless tobacco use about 13 years ago. He reports that he does not drink alcohol or use illicit drugs. personal history of smoking for 45 years; he quit 8 years ago. 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 2GC's.     Family History: Family History  Problem Relation Age of Onset  . Coronary artery disease    . Stroke     Father deceased at 59 of CVA, Mother deceased at 63 CAD/MI..hx. OA. All 3 siblings deceased HX of COPD. One child deased at 2 wks coxsachie, and other has MI at 24 (still living)   Review of Systems:  Review of  Systems - No CP, SOB or any issue according to him. Poor appetite. Having BMs. All organ Systems (-) per pt despite obvious issues.  Physical Exam:  Blood pressure 100/43, pulse 78, temperature 101.5 F (38.6 C), temperature source Rectal, resp. rate 22, SpO2 93.00%. Filed Vitals:   05/26/12 1927 05/26/12 1929 05/26/12 2106  BP: 97/45 100/43   Pulse: 78    Temp: 98.4 F (36.9 C)  101.5 F (38.6 C)  TempSrc: Oral  Rectal  Resp: 22    SpO2: 93%     General appearance: looks fine Head: Normocephalic, without obvious abnormality, atraumatic Eyes: conjunctivae/corneas clear. PERRL, EOM's intact.  Nose: Nares normal. Septum midline. Mucosa normal. No drainage or sinus tenderness. Throat: lips, mucosa, and tongue dry Neck: no adenopathy, no carotid bruit, no JVD and thyroid not enlarged, symmetric, no tenderness/mass/nodules Resp: CTA Cardio: Reg/paced. m. GI: soft, non-tender; bowel sounds normal; no masses,  no organomegaly Foley draining blood Extremities: extremities normal, atraumatic, no cyanosis or edema  2nd finger removal after table saw injury-1969 Pulses: 2+ and symmetric Lymph nodes: no cervical lymphadenopathy Neurologic: Alert and oriented person and my name.  He is a little off from baseline, normal strength and tone. Normal symmetric reflexes.     Labs on Admission:   [facility] 05/26/12 2132  NA 136  K 4.4  CL 101  CO2 24  GLUCOSE 111*  BUN 33*  CREATININE 2.83*  CALCIUM 8.8  MG --  PHOS --   No results found for this basename: AST:2,ALT:2,ALKPHOS:2,BILITOT:2,PROT:2,ALBUMIN:2 in the last 72 hours No results found for this basename: LIPASE:2,AMYLASE:2 in the last 72 hours  Basename 05/26/12 2132  WBC 13.4*  NEUTROABS 10.5*  HGB 12.1*  HCT 37.2*  MCV 91.9  PLT 185   No results found for this basename: CKTOTAL:3,CKMB:3,CKMBINDEX:3,TROPONINI:3 in the last 72 hours Lab Results  Component Value Date   INR 1.0 RATIO  01/25/2008     LAB RESULT POCT:    Results for orders placed during the hospital encounter of 05/26/12  URINALYSIS, ROUTINE W REFLEX MICROSCOPIC      Component Value Range   Color, Urine RED (*) YELLOW   APPearance TURBID (*) CLEAR   Specific Gravity, Urine 1.020  1.005 - 1.030   pH 5.0  5.0 - 8.0   Glucose, UA NEGATIVE  NEGATIVE mg/dL   Hgb urine dipstick LARGE (*) NEGATIVE   Bilirubin Urine LARGE (*) NEGATIVE   Ketones, ur 15 (*) NEGATIVE mg/dL   Protein, ur >300 (*) NEGATIVE mg/dL   Urobilinogen, UA 1.0  0.0 - 1.0 mg/dL   Nitrite POSITIVE (*) NEGATIVE   Leukocytes, UA LARGE (*) NEGATIVE  CBC WITH DIFFERENTIAL      Component Value Range   WBC 13.4 (*) 4.0 - 10.5 K/uL   RBC 4.05 (*) 4.22 - 5.81 MIL/uL   Hemoglobin 12.1 (*) 13.0 - 17.0 g/dL   HCT 37.2 (*) 39.0 - 52.0 %   MCV 91.9  78.0 - 100.0 fL   MCH 29.9  26.0 - 34.0 pg   MCHC 32.5  30.0 - 36.0 g/dL   RDW 13.7  11.5 - 15.5 %   Platelets 185  150 - 400 K/uL   Neutrophils Relative 79 (*) 43 - 77 %   Neutro Abs 10.5 (*) 1.7 - 7.7 K/uL   Lymphocytes Relative 10 (*) 12 - 46 %   Lymphs Abs 1.4  0.7 - 4.0 K/uL   Monocytes Relative 11  3 - 12 %   Monocytes Absolute 1.5 (*) 0.1 - 1.0 K/uL   Eosinophils Relative 0  0 - 5 %   Eosinophils Absolute 0.0  0.0 - 0.7 K/uL   Basophils Relative 0  0 - 1 %   Basophils Absolute 0.0  0.0 - 0.1 K/uL  BASIC METABOLIC PANEL      Component Value Range   Sodium 136  135 - 145 mEq/L   Potassium 4.4  3.5 - 5.1 mEq/L   Chloride 101  96 - 112 mEq/L   CO2 24  19 - 32 mEq/L   Glucose, Bld 111 (*) 70 - 99 mg/dL   BUN 33 (*) 6 - 23 mg/dL   Creatinine, Ser 2.83 (*) 0.50 - 1.35 mg/dL   Calcium 8.8  8.4 - 10.5 mg/dL   GFR calc non Af Amer 20 (*) >90 mL/min   GFR calc Af Amer 23 (*) >90 mL/min  URINE MICROSCOPIC-ADD ON      Component Value Range   Squamous Epithelial / LPF RARE  RARE   WBC, UA TOO NUMEROUS TO COUNT  <3 WBC/hpf   RBC / HPF TOO NUMEROUS TO COUNT  <3 RBC/hpf   Bacteria, UA FIELD OBSCURED BY RBC'S (*) RARE    Urine-Other URINALYSIS PERFORMED ON SUPERNATANT        Radiological Exams on Admission: Ct Head Wo Contrast  05/26/2012  *RADIOLOGY REPORT*  Clinical Data: Altered mental status.  CT HEAD WITHOUT CONTRAST  Technique:  Contiguous axial images were obtained from the base of the skull through the vertex without contrast.  Comparison: None.  Findings: No mass lesion, mass effect, midline shift, hydrocephalus, hemorrhage.  No acute territorial cortical ischemia/infarct. Atrophy and chronic ischemic white matter disease is present.  Benign basal ganglia calcifications are present.  Mild intracranial atherosclerosis.  Mastoid air cells are clear.  The paranasal sinuses appear normal.  IMPRESSION: Atrophy and chronic ischemic white matter  disease without acute intracranial abnormality.  Original Report Authenticated By: Dereck Ligas, M.D.      Orders placed in visit on 05/14/12  . EKG 12-LEAD  . EKG 12-LEAD     Assessment/Plan Active Problems:  * No active hospital problems. *    AMS- from infection, post op care, possible Medicines, DeH, and ARF.  Hold Narcotics, levsin, and Ditropan with the confusion Gentle Hydrate.  Check Dig level. Abx.  Change Cipro to renal dosing.  Add Rocephin (PCN Allergy should tolerate easily).  Cx. H/O CAD s/p PTCIs and previous large anterior wall myocardial infarction 12/05.  ASA and Plavix on hold for 5-7 days. No Angina. CHF c EF 25% watch for vol Overload.  Lasix on hold through Thursday. S/P St. Jude BiV ICD for V Tachy- recent interogation. Dr Sung Amabile. Parox AFib but maintaining NSR rhythm on amiodarone. He is not on Coumadin secondary to GI bleed. CKDz/CRI - baseline creatinine about 1.5-2.1. 04/29/12 Cr 2.0.  Currently 2.8. Gentle hydration and follow in am HTN - BP on lower side.  Home meds written with parameters. Lipids BET.  Bladder cancer - S/P Surgery yesterday Chronic Anemia. Hbg 12.1 and follow Barrett"s esophagus/GERD/Hx of  gastrointestinal bleeding/PUD - PPI  OA   Hypothyroid  SCDs for DVT Proph - cannot use anticoagulants. PT/OT/CW if needed - assess in am.       May need Vanco?  Staph was + 05/19/12   Kenyada Dosch M 05/26/2012, 10:43 PM

## 2012-05-26 NOTE — ED Notes (Addendum)
Pt coming form [facility] with c/o foley catheter not working properly. Pt is a poor historian. Family states he gets the urge to urinate, so he goes to the bathroom and messes with his catheter. Pt reports his bag was filled twice yesterday, since then the drainage has stopped. Pt also has blood on his legs. Pt reports pain in his lower abdomen that is intermittent. Family reports pt has become more confused since yesterday.

## 2012-05-26 NOTE — Op Note (Signed)
NAME:  [name], [name] NO.:  [id number]  MEDICAL RECORD NO.:  [id number]  LOCATION:  [facility]                         FACILITY:  [facility]  PHYSICIAN:  Rolan Bucco, MD    DATE OF BIRTH:  Nov 21, 1931  DATE OF PROCEDURE: 05/25/2012 DATE OF DISCHARGE:                                OPERATIVE REPORT   SURGEON:  Rolan Bucco, MD  ASSISTANT:  None.  PREOPERATIVE DIAGNOSIS:  Bladder tumor.  POSTOPERATIVE DIAGNOSIS:  Bladder tumor.  PROCEDURES PERFORMED: 1. Cystoscopy. 2. Transurethral resection of bladder tumor larger than 2 cm less than     5 cm. 3. Bilateral retrograde pyelogram. 4. Bilateral ureteral stent placement. 5. Fluoroscopy with interpretation less than 1 hour.  FINDINGS:  Sessile tumor larger than 2 cm but less than 5 cm, along the left bladder along the left bladder floor extending up the wall of the bladder, covering the left ureteral orifice.  There were no filling defects and bilateral ureteral retrograde pyelograms.  No bladder perforation is noted.  ESTIMATED BLOOD LOSS:  Minimal.  COMPLICATIONS:  None.  DRAINS:  Foley catheter.  SPECIMEN:  Bladder tumor sent for pathology.  HISTORY OF PRESENT ILLNESS:  A 78 year old man who was worked up for hematuria and found to have a bladder tumor on office cystoscopy.  He has renal insufficiency.  Given his comorbidities and findings of the bladder tumor, I recommended bilateral retrograde pyelograms and transurethral resection of bladder tumor.  He presents to the hospital for this procedure.  He held his Plavix as recommended by the cardiologist.  PROCEDURE:  Informed consent was obtained.  The patient was taken to the operating room, was placed supine position.  IV antibiotics infused and general anesthesia was induced.  SCDs were turned on and in place prior to the induction of anesthesia.  Following this, he was placed in dorsal lithotomy position making sure to pad all pertinent  neurovascular pressure points appropriately.  His genitals were prepped and draped in usual fashion.  Rigid cystoscope was passed through the urethra and the bladder.  He was noted to have a high- riding bladder neck due to his prostate.  The bladder was distended fully evaluated in systematic fashion with a 30-degree and 70-degree lens.  There was no to be assessed all bladder tumor larger than 2 cm less than 5 cm, located on the left lateral bladder floor obscuring the left ureteral orifice, somewhat and extending up the left lateral bladder wall.  There were no other bladder tumors elsewhere in the bladder.  Noted to have a highly trabeculated bladder.  Attention was then turned to the right ureteral orifice.  It was attempted to be cannulated with, it was noted to be very narrow and was attempted to be cannulated with a 5-French ureteral catheter.  However this was unsuccessful.  Next, I attempted cannulation with a cone tip catheter.  I injected contrast in order to obtain a retrograde pyelogram, however, this simply resulted in distention of the mucosa of the bladder.  Next, a sensor tip wire was placed up the ureteral orifice which was confirmed on fluoroscopy with a good curl on the right renal pelvis. The 5-French ureteral catheter was placed over  this into the distal ureter and a retrograde pyelogram was obtained when I injected the contrast.  There were no filling defects in the bilateral upper tracts or in the ureters.  Because of the edema around the ureteral orifice due to the extravasation, I felt that it would be best to leave a ureteral stent placed.  Therefore, a sensor tip wire was placed back through the ureter catheter and the catheter was back loaded and then a 6 x 26 double-J ureteral stent was placed without the strings in place over the wire.  Good curl was noted in the right renal pelvis on fluoroscopy and a good curl was noted in their direct visualization in the  bladder. Attention was then turned to the left bladder wall.  After searching to the tumor, I was able to find the ureteral orifice and a 5-French open- end catheter was able to be placed and obtained a retrograde pyelogram by injecting contrast.  There were no filling defects in the ureter or the collecting system.  Next, a gyrus resectoscope was placed with the visual obturator, and then a transurethral resection of bladder tumor was carried out.  All of the initial chips were sent for permanent pathology.  Next, a cold cup biopsy forceps was used to biopsy the deeper sections sent for muscle and these were sent to pathology separately.  Finally, resection over the ureteral orifice on the left side.  Itself was carried out and this was sent for separate pathology.  Fulguration was carried out to maintain good hemostasis.  There was no perforation of the bladder noted.  Next, the opening to the left ureter was identified underneath the resection area.  Sensory tip wire was placed up the left renal pelvis with a good curl on the pelvis on fluoroscopy.  A double-J 6 x 26 stent was placed without strings in place.  It was deployed in the left renal pelvis with a good curl in the pelvis and a good curl noted in the bladder.  There was noted to be bleeding from the bladder neck and the prostate itself due to the tumor growing along the bladder neck. Fulguration was used to maintain hemostasis.  After this was done, a 24- [place] 3 way Coude tip hematuria catheter was placed with 30 mL of sterile water placed in the balloon.  This placed on traction and this completed the procedure.  Belladonna and opium suppository was placed into his rectum.  He was placed back in supine position.  Anesthesia was reversed and he was taken to the PACU in stable condition.  He will be assessed at that time whether the urine is clear enough to be discharged home versus kept in the hospital overnight for  observation.          ______________________________ Rolan Bucco, MD     DW/MEDQ  D:  05/25/2012  T:  05/26/2012  Job:  [id number]

## 2012-05-26 NOTE — ED Notes (Signed)
Bladder scan showed around 80-136mL in the bladder.

## 2012-05-26 NOTE — ED Provider Notes (Signed)
Patient with recent urologic procedure with decreased uop.  Bladder scan shows little urine in bladder.  Patient with increased bun, creatinine and temp.  History/physical exam/procedure(s) were performed by non-physician practitioner and as supervising physician I was immediately available for consultation/collaboration. I have reviewed all notes and am in agreement with care and plan.   Shaune Pollack, MD 05/26/12 [phone]

## 2012-05-26 NOTE — Addendum Note (Signed)
Addended by: Lubertha Basque A on: 05/26/2012 03:49 PM   Modules accepted: Orders

## 2012-05-26 NOTE — ED Provider Notes (Signed)
History     CSN: [id number]  Arrival date & time 05/26/12  [id number]   First MD Initiated Contact with Patient 05/26/12 2049      Chief Complaint  Patient presents with  . Problem with Foley catheter    HPI  History provided by patient and family. Patient is a 78 year old male with history of hypertension, hyperlipidemia, CAD, CHF, bladder tumor with recent resection presents with concerns of Foley catheter problems. Patient had resection of bladder tumor yesterday by Dr. [name] patient appear to be doing very well following surgery and was discharged home. Family states that today patient seem to be having low output with Foley catheter with associated agitation and slight confusion. Family states agitation and confusion has improved some but patient has still been slightly confused from baseline. Family does note that there is been continuous dark urine with blood following the surgery procedure. Patient has not had any episodes of vomiting or fever at home. Patient is currently on ciprofloxacin following his surgery.    Past Medical History  Diagnosis Date  . CAD (coronary artery disease)      a. s/p anterior MI 12/05 c/b shock -> stent LAD   b. s/p stenting OM-1, 2/06  . CHF (congestive heart failure)     due to ischemic CM  a. EF 20-30%. (Nov 2008)   b. s/p St[employer] Jude BiV-ICD    c. CPX 07/2008  pvo2 16.3 (63% predicted) slope 34 RER 1.08 O2 pulse 93%  . Atrial fibrillation or flutter     maintaining sinus on amiodarone  --PFTS, TFTs ok 8/10  . CRI (chronic renal insufficiency)     (baseline 2.0-2.2)  . Hyperlipidemia   . Hypothyroidism   . GERD (gastroesophageal reflux disease)     hx Barretts esophagitis  . Skin cancer     basal cells   facial x 4, 1 right forearm  . Anemia   . 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 Myocardial infarction 09/15/2004  . History of blood transfusion     following coumadin  usage  . HTN (hypertension)       EKG 4/13 EPIC, LOV Dr Ronda Fairly with note 05/18/12,  pre Dr Caryl Comes- "stable cardiac perspective" with ICD orders on chart, last interrogation 05/14/12 EPIC  . Cough     x 1 month- productive, no fever  . ICD (implantable cardiac defibrillator) in place     BiV/ICD    Past Surgical History  Procedure Date  . Cervical laminectomy 1985  . Back surgery 1972    lower back  . Cardiac defibrillator placement     ICD St Jude  . Hernia repair 1989    bilateral  . Esophagogastroduodenoscopy   . Colonoscopy   . Coronary angioplasty [id number]  . Transurethral resection of bladder tumor 05/25/2012    Procedure: TRANSURETHRAL RESECTION OF BLADDER TUMOR (TURBT);  Surgeon: Molli Hazard, MD;  Location: [facility];  Service: Urology;  Laterality: N/A;  Cystoscopy, TURBT, Cold cup biospy of bladder    . Cystoscopy w/ retrogrades 05/25/2012    Procedure: CYSTOSCOPY WITH RETROGRADE PYELOGRAM;  Surgeon: Molli Hazard, MD;  Location: [facility];  Service: Urology;  Laterality: Bilateral;    Family History  Problem Relation Age of Onset  . Coronary artery disease    . Stroke      History  Substance Use Topics  . Smoking status: Former Smoker    Types: Cigarettes,  Cigars    Quit date: 03/02/2004  . Smokeless tobacco: Former Systems developer    Quit date: 05/20/1999   Comment: quit in 2005  . Alcohol Use: No      Review of Systems  Constitutional: Negative for fever and chills.  Respiratory: Negative for cough.   Gastrointestinal: Negative for nausea and vomiting.  Genitourinary:       Blocked Foley  Psychiatric/Behavioral: Positive for confusion and agitation.    Allergies  Amoxicillin; Ramipril; and Penicillins  Home Medications   Current Outpatient Rx  Name Route Sig Dispense Refill  . AMIODARONE HCL 200 MG PO TABS Oral Take 100 mg by mouth daily with breakfast.     . ASPIRIN EC 325 MG PO TBEC Oral Take 325 mg by mouth daily.    Marland Kitchen CARVEDILOL 6.25 MG PO TABS Oral Take 6.25 mg by  mouth 2 (two) times daily with a meal.    . CIPROFLOXACIN HCL 500 MG PO TABS Oral Take 500 mg by mouth 2 (two) times daily.    Marland Kitchen CLOPIDOGREL BISULFATE 75 MG PO TABS Oral Take 75 mg by mouth daily.    Marland Kitchen DIGOXIN 0.125 MG PO TABS Oral Take 0.25 mg by mouth every morning. 1/2 tablet- .125 mg  tablets    . EZETIMIBE 10 MG PO TABS Oral Take 10 mg by mouth daily with supper.     Marland Kitchen FERROUS SULFATE 325 (65 FE) MG PO TABS Oral Take 325 mg by mouth 3 (three) times a week. 3 times a week    . FUROSEMIDE 20 MG PO TABS Oral Take 20 mg by mouth daily with breakfast.     . HYDROCODONE-ACETAMINOPHEN 5-325 MG PO TABS Oral Take 1-2 tablets by mouth every 4 (four) hours as needed. For pain    . HYOSCYAMINE SULFATE 0.125 MG PO TABS Oral Take 0.125 mg by mouth every 4 (four) hours as needed. For cramps    . LEVOTHYROXINE SODIUM 50 MCG PO TABS Oral Take 50 mcg by mouth daily before breakfast.     . ADULT MULTIVITAMIN W/MINERALS CH Oral Take 0.5 tablets by mouth 2 (two) times daily.    Marland Kitchen BACITRACIN-NEOMYCIN-POLYMYXIN [id number] EX OINT Topical Apply 1 application topically 3 (three) times daily. apply to tip of penis.    . OXYBUTYNIN CHLORIDE 5 MG PO TABS Oral Take 5 mg by mouth every 6 (six) hours as needed. For bladder spasm    . PANTOPRAZOLE SODIUM 40 MG PO TBEC Oral Take 1 tablet by mouth daily with breakfast.     . PHENAZOPYRIDINE HCL 100 MG PO TABS Oral Take 100 mg by mouth every 8 (eight) hours as needed. For bladder pain    . POTASSIUM CHLORIDE ER 10 MEQ PO TBCR Oral Take 10 mEq by mouth daily with breakfast.     . ROSUVASTATIN CALCIUM 20 MG PO TABS Oral Take 20 mg by mouth daily with supper.     Marland Kitchen VALSARTAN 80 MG PO TABS Oral Take 40 mg by mouth 2 (two) times daily.    Marland Kitchen NITROGLYCERIN 0.4 MG SL SUBL Sublingual Place 0.4 mg under the tongue every 5 (five) minutes as needed. For chest pain      BP 100/43  Pulse 78  Temp 101.5 F (38.6 C) (Rectal)  Resp 22  SpO2 93%  Physical Exam  Nursing note and vitals  reviewed. Constitutional: He appears well-developed and well-nourished. No distress.  HENT:  Head: Normocephalic and atraumatic.  Neck: Normal range of motion. Neck supple.  No meningeal sign  Cardiovascular: Normal rate and regular rhythm.   Pulmonary/Chest: Effort normal and breath sounds normal.  Abdominal: Soft. He exhibits no distension. There is no tenderness. There is no rebound and no guarding.       No CVA tenderness  Genitourinary: Penis normal.       Foley catheter with small amounts of blood and leg bag. There appears to be a dark blood clot along the Foley catheter tubing.  Neurological: He is alert.       Unsteady gait requiring slight assistant. Strength equal bilaterally. No facial droop.  Skin: Skin is warm.  Psychiatric: He has a normal mood and affect. His behavior is normal.    ED Course  Procedures   Results for orders placed during the hospital encounter of 05/26/12  URINALYSIS, ROUTINE W REFLEX MICROSCOPIC      Component Value Range   Color, Urine RED (*) YELLOW   APPearance TURBID (*) CLEAR   Specific Gravity, Urine 1.020  1.005 - 1.030   pH 5.0  5.0 - 8.0   Glucose, UA NEGATIVE  NEGATIVE mg/dL   Hgb urine dipstick LARGE (*) NEGATIVE   Bilirubin Urine LARGE (*) NEGATIVE   Ketones, ur 15 (*) NEGATIVE mg/dL   Protein, ur >300 (*) NEGATIVE mg/dL   Urobilinogen, UA 1.0  0.0 - 1.0 mg/dL   Nitrite POSITIVE (*) NEGATIVE   Leukocytes, UA LARGE (*) NEGATIVE  CBC WITH DIFFERENTIAL      Component Value Range   WBC 13.4 (*) 4.0 - 10.5 K/uL   RBC 4.05 (*) 4.22 - 5.81 MIL/uL   Hemoglobin 12.1 (*) 13.0 - 17.0 g/dL   HCT 37.2 (*) 39.0 - 52.0 %   MCV 91.9  78.0 - 100.0 fL   MCH 29.9  26.0 - 34.0 pg   MCHC 32.5  30.0 - 36.0 g/dL   RDW 13.7  11.5 - 15.5 %   Platelets 185  150 - 400 K/uL   Neutrophils Relative 79 (*) 43 - 77 %   Neutro Abs 10.5 (*) 1.7 - 7.7 K/uL   Lymphocytes Relative 10 (*) 12 - 46 %   Lymphs Abs 1.4  0.7 - 4.0 K/uL   Monocytes Relative  11  3 - 12 %   Monocytes Absolute 1.5 (*) 0.1 - 1.0 K/uL   Eosinophils Relative 0  0 - 5 %   Eosinophils Absolute 0.0  0.0 - 0.7 K/uL   Basophils Relative 0  0 - 1 %   Basophils Absolute 0.0  0.0 - 0.1 K/uL  BASIC METABOLIC PANEL      Component Value Range   Sodium 136  135 - 145 mEq/L   Potassium 4.4  3.5 - 5.1 mEq/L   Chloride 101  96 - 112 mEq/L   CO2 24  19 - 32 mEq/L   Glucose, Bld 111 (*) 70 - 99 mg/dL   BUN 33 (*) 6 - 23 mg/dL   Creatinine, Ser 2.83 (*) 0.50 - 1.35 mg/dL   Calcium 8.8  8.4 - 10.5 mg/dL   GFR calc non Af Amer 20 (*) >90 mL/min   GFR calc Af Amer 23 (*) >90 mL/min  URINE MICROSCOPIC-ADD ON      Component Value Range   Squamous Epithelial / LPF RARE  RARE   WBC, UA TOO NUMEROUS TO COUNT  <3 WBC/hpf   RBC / HPF TOO NUMEROUS TO COUNT  <3 RBC/hpf   Bacteria, UA FIELD OBSCURED BY RBC'S (*) RARE  Urine-Other URINALYSIS PERFORMED ON SUPERNATANT         Ct Head Wo Contrast  05/26/2012  *RADIOLOGY REPORT*  Clinical Data: Altered mental status.  CT HEAD WITHOUT CONTRAST  Technique:  Contiguous axial images were obtained from the base of the skull through the vertex without contrast.  Comparison: None.  Findings: No mass lesion, mass effect, midline shift, hydrocephalus, hemorrhage.  No acute territorial cortical ischemia/infarct. Atrophy and chronic ischemic white matter disease is present.  Benign basal ganglia calcifications are present.  Mild intracranial atherosclerosis.  Mastoid air cells are clear.  The paranasal sinuses appear normal.  IMPRESSION: Atrophy and chronic ischemic white matter disease without acute intracranial abnormality.  Original Report Authenticated By: Dereck Ligas, M.D.     1. Renal failure   2. Dehydration   3. Fever       MDM  8:50 PM patient seen and evaluated.  Patient seen and discussed with attending physician. Patient with significantly increased BUN and creatinine. Patient also with rectal temperature. Bladder scan did not  show any urine and bladder. This time suspect dehydration. Will continue fluids.   Spoke with Dr. Gaynelle Arabian on-call for urology. He states he is not interested in admitting pt at this time. He agrees with plan for continued IV fluids. Recommends consultation with hospitalist.   Spoke with Dr. Virgina Jock, patient's PCP. He will admit patient for observation and continued treatment.       Martie Lee, PA 05/26/12 2300

## 2012-05-27 ENCOUNTER — Encounter (HOSPITAL_COMMUNITY): Payer: Self-pay | Admitting: Radiology

## 2012-05-27 ENCOUNTER — Inpatient Hospital Stay (HOSPITAL_COMMUNITY): Payer: Medicare Other

## 2012-05-27 DIAGNOSIS — E860 Dehydration: Secondary | ICD-10-CM

## 2012-05-27 DIAGNOSIS — R410 Disorientation, unspecified: Secondary | ICD-10-CM

## 2012-05-27 DIAGNOSIS — N179 Acute kidney failure, unspecified: Secondary | ICD-10-CM

## 2012-05-27 DIAGNOSIS — R509 Fever, unspecified: Secondary | ICD-10-CM

## 2012-05-27 DIAGNOSIS — N390 Urinary tract infection, site not specified: Secondary | ICD-10-CM | POA: Diagnosis present

## 2012-05-27 LAB — CBC
MCH: 30.1 pg (ref 26.0–34.0)
MCV: 92.2 fL (ref 78.0–100.0)
Platelets: 180 10*3/uL (ref 150–400)
RBC: 3.72 MIL/uL — ABNORMAL LOW (ref 4.22–5.81)

## 2012-05-27 LAB — COMPREHENSIVE METABOLIC PANEL
AST: 26 U/L (ref 0–37)
Albumin: 3.1 g/dL — ABNORMAL LOW (ref 3.5–5.2)
Chloride: 106 mEq/L (ref 96–112)
Creatinine, Ser: 2.34 mg/dL — ABNORMAL HIGH (ref 0.50–1.35)
Potassium: 3.9 mEq/L (ref 3.5–5.1)
Total Bilirubin: 0.4 mg/dL (ref 0.3–1.2)

## 2012-05-27 NOTE — H&P (Signed)
Urology History and Physical Exam  CC: ARF. Fever.  HPI: 78 year old male with bladder cancer who had a transurethral resection of bladder tumor 05/25/12 with placement of bilateral ureter stents. He was discharged home with a catheter in place. The patient returned to the ER late last night with delirium and fever. He complains of SP pressure which are likely due to bladder spasms.  His catheter flushes easily without return of blood clot. He has had good urine output since admission. His urine is relatively clear from the hematuria point of view (it is orange which is from the pyridium). UA looks infected, but I do not know if this was drawn from his bag or not. He was covered with antibiotics when discharged home.  His renal function has improved with IV hydration. He has bilateral ureter stents in place with a catheter in place as well. I recommend ruling out obstruction of the ureters with a renal US (he may have mild hydronephrosis due to reflux up the stents. I also recommend ruling out bladder perforation with CT cystogram. The risk of this is low with good urine output and an indwelling catheter, but I feel it is in his best interest to ensure there is no leakage.  PMH: Past Medical History  Diagnosis Date  . CAD (coronary artery disease)      a. s/p anterior MI 12/05 c/b shock -> stent LAD   b. s/p stenting OM-1, 2/06  . CHF (congestive heart failure)     due to ischemic CM  a. EF 20-30%. (Nov 2008)   b. s/p St. Jude BiV-ICD    c. CPX 07/2008  pvo2 16.3 (63% predicted) slope 34 RER 1.08 O2 pulse 93%  . Atrial fibrillation or flutter     maintaining sinus on amiodarone  --PFTS, TFTs ok 8/10  . CRI (chronic renal insufficiency)     (baseline 2.0-2.2)  . Hyperlipidemia   . Hypothyroidism   . GERD (gastroesophageal reflux disease)     hx Barretts esophagitis  . Skin cancer     basal cells   facial x 4, 1 right forearm  . Anemia   . Neuromuscular disorder     tremors x years-  "familial tremors"   no neurologist  . Myocardial infarction 09/15/2004  . History of blood transfusion     following coumadin  usage  . HTN (hypertension)     EKG 4/13 EPIC, LOV Dr Ronda Fairly with note 05/18/12,  pre Dr Caryl Comes- "stable cardiac perspective" with ICD orders on chart, last interrogation 05/14/12 EPIC  . Cough     x 1 month- productive, no fever  . ICD (implantable cardiac defibrillator) in place     BiV/ICD    PSH: Past Surgical History  Procedure Date  . Cervical laminectomy 1985  . Back surgery 1972    lower back  . Cardiac defibrillator placement     ICD St Jude  . Hernia repair 1989    bilateral  . Esophagogastroduodenoscopy   . Colonoscopy   . Coronary angioplasty [id number]  . Transurethral resection of bladder tumor 05/25/2012    Procedure: TRANSURETHRAL RESECTION OF BLADDER TUMOR (TURBT);  Surgeon: Molli Hazard, MD;  Location: [facility];  Service: Urology;  Laterality: N/A;  Cystoscopy, TURBT, Cold cup biospy of bladder    . Cystoscopy w/ retrogrades 05/25/2012    Procedure: CYSTOSCOPY WITH RETROGRADE PYELOGRAM;  Surgeon: Molli Hazard, MD;  Location: [facility];  Service: Urology;  Laterality: Bilateral;  Allergies: Allergies  Allergen Reactions  . Amoxicillin     REACTION: rash  . Ramipril     REACTION: cough  . Penicillins Rash    Medications: Prescriptions prior to admission  Medication Sig Dispense Refill  . amiodarone (PACERONE) 200 MG tablet Take 100 mg by mouth daily with breakfast.       . aspirin EC 325 MG tablet Take 325 mg by mouth daily.      . carvedilol (COREG) 6.25 MG tablet Take 6.25 mg by mouth 2 (two) times daily with a meal.      . ciprofloxacin (CIPRO) 500 MG tablet Take 500 mg by mouth 2 (two) times daily.      . clopidogrel (PLAVIX) 75 MG tablet Take 75 mg by mouth daily.      . digoxin (LANOXIN) 0.125 MG tablet Take 0.25 mg by mouth every morning. 1/2 tablet- .125 mg  tablets      . ezetimibe (ZETIA) 10 MG tablet  Take 10 mg by mouth daily with supper.       . ferrous sulfate ([employer] IRON) 325 (65 FE) MG tablet Take 325 mg by mouth 3 (three) times a week. 3 times a week      . furosemide (LASIX) 20 MG tablet Take 20 mg by mouth daily with breakfast.       . HYDROcodone-acetaminophen (NORCO/VICODIN) 5-325 MG per tablet Take 1-2 tablets by mouth every 4 (four) hours as needed. For pain      . hyoscyamine (LEVSIN, ANASPAZ) 0.125 MG tablet Take 0.125 mg by mouth every 4 (four) hours as needed. For cramps      . levothyroxine (SYNTHROID, LEVOTHROID) 50 MCG tablet Take 50 mcg by mouth daily before breakfast.       . Multiple Vitamin (MULTIVITAMIN WITH MINERALS) TABS Take 0.5 tablets by mouth 2 (two) times daily.      Marland Kitchen neomycin-bacitracin-polymyxin (NEOSPORIN) ointment Apply 1 application topically 3 (three) times daily. apply to tip of penis.      Marland Kitchen oxybutynin (DITROPAN) 5 MG tablet Take 5 mg by mouth every 6 (six) hours as needed. For bladder spasm      . pantoprazole (PROTONIX) 40 MG tablet Take 1 tablet by mouth daily with breakfast.       . phenazopyridine (PYRIDIUM) 100 MG tablet Take 100 mg by mouth every 8 (eight) hours as needed. For bladder pain      . potassium chloride (K-DUR) 10 MEQ tablet Take 10 mEq by mouth daily with breakfast.       . rosuvastatin (CRESTOR) 20 MG tablet Take 20 mg by mouth daily with supper.       . valsartan (DIOVAN) 80 MG tablet Take 40 mg by mouth 2 (two) times daily.      . nitroGLYCERIN (NITROSTAT) 0.4 MG SL tablet Place 0.4 mg under the tongue every 5 (five) minutes as needed. For chest pain         Social History: History   Social History  . Marital Status: Married    Spouse Name: N/A    Number of Children: N/A  . Years of Education: N/A   Occupational History  . retired    Social History Main Topics  . Smoking status: Former Smoker    Types: Cigarettes, Cigars    Quit date: 03/02/2004  . Smokeless tobacco: Former Systems developer    Quit date: 05/20/1999   Comment:  quit in 2005  . Alcohol Use: No  . Drug Use: No  .  Sexually Active: Not on file   Other Topics Concern  . Not on file   Social History Narrative  . No narrative on file    Family History: Family History  Problem Relation Age of Onset  . Coronary artery disease    . Stroke      Review of Systems: Positive: Malaise, SP pressure, fever. Negative: Nausea, chest pain, SOB.  A further 10 point review of systems was negative except what is listed in the HPI.  Physical Exam:  General: No acute distress.  Awake. Head:  Normocephalic.  Atraumatic. ENT:  EOMI.  Mucous membranes moist Neck:  Supple.  No lymphadenopathy. CV:  S1 present. S2 present. Regular rate. Pulmonary: Equal effort bilaterally.  Clear to auscultation bilaterally. Abdomen: Soft.  Non- tender to palpation. No rebound TTP Skin:  Normal turgor.  No visible rash. Extremity: No gross deformity of bilateral upper extremities.  No gross deformity of    bilateral lower extremities. Neurologic: Alert. Appropriate mood.  Penis:  Uncircumcised.  Paraphimosis present- easily reduced. No lesions. Urethra: Foley catheter in place.  Orthotopic meatus. Foley draining orange urine. Scrotum: No lesions.  No ecchymosis.  No erythema.   Studies:  Recent Labs  [facility] 05/27/12 0505 05/26/12 2132   HGB 11.2* 12.1*   WBC 10.4 13.4*   PLT 180 185    Recent Labs  Basename 05/27/12 0505 05/26/12 2132   NA 140 136   K 3.9 4.4   CL 106 101   CO2 23 24   BUN 29* 33*   CREATININE 2.34* 2.83*   CALCIUM 8.2* 8.8   GFRNONAA 25* 20*   GFRAA 29* 23*     No results found for this basename: PT:2,INR:2,APTT:2 in the last 72 hours   No components found with this basename: ABG:2    Assessment:  Fever. Acute renal failure.  Plan: -Rule out renal obstruction with bilateral renal US now (ordered). -Rule out bladder perforation with CT cystogram stat (ordered). I feel this is a low probability. -NPO with sips of meds. -Will  follow. -Treat empirically for UTI, but if UA specimen was drawn from the bag it may be inaccurate.

## 2012-05-27 NOTE — Progress Notes (Signed)
Subjective: Less confusion. Seems at baseline. Ab distended. Ab tender and cramping Somehow he was kept on 125 cc per hr NS despite order for only 30 cc - he has no cp or sob or any issues with the fluids. No More fever. sats fine BP better  Objective: Vital signs in last 24 hours: Temp:  [98.2 F (36.8 C)-101.5 F (38.6 C)] 98.2 F (36.8 C) (08/14 0520) Pulse Rate:  [60-78] 60  (08/14 0520) Resp:  [18-22] 18  (08/14 0520) BP: (97-148)/(43-73) 148/73 mmHg (08/14 0520) SpO2:  [91 %-96 %] 96 % (08/14 0520) Weight:  [83.7 kg (184 lb 8.4 oz)] 83.7 kg (184 lb 8.4 oz) (08/14 0115) Weight change:  Last BM Date: 05/25/12  CBG (last 3)  No results found for this basename: GLUCAP:3 in the last 72 hours  Intake/Output from previous day:  Intake/Output Summary (Last 24 hours) at 05/27/12 0711 Last data filed at 05/27/12 0646  Gross per 24 hour  Intake      0 ml  Output   1450 ml  Net  -1450 ml   08/13 0701 - 08/14 0700 In: -  Out: [id number] [Urine:1450]   Physical Exam  General appearance: looks fine  OP - Moist Throat: lips, mucosa, and tongue dry  Neck: no adenopathy, no carotid bruit, no JVD and thyroid not enlarged, symmetric, no tenderness/mass/nodules  Resp: CTA  Cardio: Reg/paced. M. AICD  GI: Distended.  Mild Tender soft,  bowel sounds normal; no masses, no organomegaly  Foley draining blood  Extremities: extremities normal,  no cyanosis or edema  2nd finger removal after table saw injury-1969  Pulses: 2+ and symmetric  Lymph nodes: no cervical lymphadenopathy  Neurologic: Alert and oriented person and my name. He is a better mentally, normal strength and tone. Normal symmetric reflexes   Lab Results:  Basename 05/27/12 0505 05/26/12 2132  NA 140 136  K 3.9 4.4  CL 106 101  CO2 23 24  GLUCOSE 106* 111*  BUN 29* 33*  CREATININE 2.34* 2.83*  CALCIUM 8.2* 8.8  MG -- --  PHOS -- --     Basename 05/27/12 0505  AST 26  ALT 11  ALKPHOS 47  BILITOT 0.4    PROT 6.2  ALBUMIN 3.1*     Basename 05/27/12 0505 05/26/12 2132  WBC 10.4 13.4*  NEUTROABS -- 10.5*  HGB 11.2* 12.1*  HCT 34.3* 37.2*  MCV 92.2 91.9  PLT 180 185    Lab Results  Component Value Date   INR 1.0 RATIO 01/25/2008    No results found for this basename: CKTOTAL:3,CKMB:3,CKMBINDEX:3,TROPONINI:3 in the last 72 hours  No results found for this basename: TSH,T4TOTAL,FREET3,T3FREE,THYROIDAB in the last 72 hours  No results found for this basename: VITAMINB12:2,FOLATE:2,FERRITIN:2,TIBC:2,IRON:2,RETICCTPCT:2 in the last 72 hours  Micro Results: Recent Results (from the past 240 hour(s))  SURGICAL PCR SCREEN     Status: Abnormal   Collection Time   05/19/12  9:52 AM      Component Value Range Status Comment   MRSA, PCR NEGATIVE  NEGATIVE Final    Staphylococcus aureus POSITIVE (*) NEGATIVE Final      Studies/Results: Ct Head Wo Contrast  05/26/2012  *RADIOLOGY REPORT*  Clinical Data: Altered mental status.  CT HEAD WITHOUT CONTRAST  Technique:  Contiguous axial images were obtained from the base of the skull through the vertex without contrast.  Comparison: None.  Findings: No mass lesion, mass effect, midline shift, hydrocephalus, hemorrhage.  No acute territorial cortical ischemia/infarct. Atrophy and  chronic ischemic white matter disease is present.  Benign basal ganglia calcifications are present.  Mild intracranial atherosclerosis.  Mastoid air cells are clear.  The paranasal sinuses appear normal.  IMPRESSION: Atrophy and chronic ischemic white matter disease without acute intracranial abnormality.  Original Report Authenticated By: Dereck Ligas, M.D.     Medications: Scheduled:    . sodium chloride   Intravenous Once  . acetaminophen  650 mg Oral Once  . amiodarone  100 mg Oral Q breakfast  . atorvastatin  40 mg Oral q1800  . carvedilol  6.25 mg Oral BID WC  . cefTRIAXone (ROCEPHIN)  IV  1 g Intravenous Q24H  . ciprofloxacin  250 mg Oral BID  . digoxin   0.25 mg Oral q morning - 10a  . ezetimibe  10 mg Oral Q supper  . ferrous sulfate  325 mg Oral 3 times weekly  . furosemide  20 mg Oral Q breakfast  . irbesartan  75 mg Oral Daily  . levothyroxine  50 mcg Oral QAC breakfast  . multivitamin with minerals  1 tablet Oral BID  . neomycin-bacitracin-polymyxin  1 application Topical TID  . pantoprazole  40 mg Oral Q breakfast  . phenazopyridine  100 mg Oral Q8H  . sodium chloride  500 mL Intravenous Once   Continuous:    . sodium chloride 30 mL/hr at 05/27/12 [id number]     Assessment/Plan: Principal Problem:  *Confusion Active Problems:  Bladder tumor  UTI (lower urinary tract infection)  Acute kidney injury  Fever  Dehydration  AMS- from infection, post op care, possible Medicines, DeH, and ARF. Hold Narcotics, levsin, and Ditropan with the confusion  I ordered gentle hydration, but he received 125cc/hr (based on order written 1 hr before mine by ED PA) overnight and I had the nurse back it to 30 cc per hr.  Cr improved from 2.8 - 2.3. Dig level 0.8. Watch for CHF issues. Continue Abx for UTI? =  continue Cipro at renal dosing and s/p Rocephin (PCN Allergy should tolerate easily). Cx P.  H/O CAD s/p PTCIs and previous large anterior wall myocardial infarction 12/05. ASA and Plavix on hold for 5-7 days. No Angina.  CHF c EF 25% watch for vol Overload. Lasix on hold through Thursday. He tolerated the fluids overnight and is not in CHF Monitor. S/P [employer] BiV ICD for V Tachy- recent interogation. Dr Sung Amabile.  Parox AFib but maintaining NSR rhythm on amiodarone. He is not on Coumadin secondary to GI bleed.  CKDz/CRI - baseline creatinine about 1.5-2.1. 04/29/12 Cr 2.0. Currently 2.8 - 2.3. Gentle hydration and follow in am tomorrow HTN - BP Better post fluids. Home meds written with parameters.  Bladder cancer - Post Op Day 2 - S/P foley irrigation and Bladder scan.  CHeck KUB with soreness.  Get Dr [name] to see pt. ? Need for Ab  [place]? Chronic Anemia. Hbg 12.1 to 11.2 post fluids - expected. Barrett"s esophagus/GERD/Hx of gastrointestinal bleeding/PUD - PPI  Hypothyroid - on meds SCDs for DVT Proph - cannot use anticoagulants.  PT/OT/CW if needed - Will order if needed.      ID -  Anti-infectives     Start     Dose/Rate Route Frequency Ordered Stop   05/27/12 0000   ciprofloxacin (CIPRO) tablet 250 mg        250 mg Oral 2 times daily 05/26/12 2354     05/27/12 0000   cefTRIAXone (ROCEPHIN) 1 g in dextrose 5 % 50  mL IVPB        1 g 100 mL/hr over 30 Minutes Intravenous Every 24 hours 05/26/12 2354              LOS: 1 day   Alexandrina Fiorini M 05/27/2012, 7:11 AM

## 2012-05-28 ENCOUNTER — Encounter: Payer: Self-pay | Admitting: *Deleted

## 2012-05-28 LAB — CBC
HCT: 33.5 % — ABNORMAL LOW (ref 39.0–52.0)
Hemoglobin: 10.9 g/dL — ABNORMAL LOW (ref 13.0–17.0)
MCH: 30.1 pg (ref 26.0–34.0)
MCHC: 32.5 g/dL (ref 30.0–36.0)
MCV: 92.5 fL (ref 78.0–100.0)

## 2012-05-28 LAB — BASIC METABOLIC PANEL
BUN: 21 mg/dL (ref 6–23)
CO2: 27 mEq/L (ref 19–32)
Chloride: 108 mEq/L (ref 96–112)
Creatinine, Ser: 1.66 mg/dL — ABNORMAL HIGH (ref 0.50–1.35)
Glucose, Bld: 118 mg/dL — ABNORMAL HIGH (ref 70–99)

## 2012-05-28 LAB — URINE CULTURE
Colony Count: NO GROWTH
Culture: NO GROWTH

## 2012-05-28 MED ORDER — FUROSEMIDE 40 MG PO TABS
40.0000 mg | ORAL_TABLET | Freq: Every day | ORAL | Status: DC
  Administered 2012-05-28: 40 mg via ORAL
  Filled 2012-05-28 (×2): qty 1

## 2012-05-28 MED ORDER — LEVOFLOXACIN 250 MG PO TABS
250.0000 mg | ORAL_TABLET | Freq: Every day | ORAL | Status: DC
  Administered 2012-05-28: 250 mg via ORAL
  Filled 2012-05-28: qty 1

## 2012-05-28 MED ORDER — LEVOFLOXACIN IN D5W 250 MG/50ML IV SOLN
250.0000 mg | INTRAVENOUS | Status: DC
  Filled 2012-05-28: qty 50

## 2012-05-28 MED ORDER — LEVOFLOXACIN 250 MG PO TABS: 250.0000 mg | ORAL_TABLET | Freq: Every day | ORAL | Status: AC

## 2012-05-28 MED ORDER — BISACODYL 10 MG RE SUPP
10.0000 mg | Freq: Two times a day (BID) | RECTAL | Status: DC
  Administered 2012-05-28: 10 mg via RECTAL
  Filled 2012-05-28: qty 1

## 2012-05-28 MED ORDER — FLEET ENEMA 7-19 GM/118ML RE ENEM
1.0000 | ENEMA | Freq: Once | RECTAL | Status: DC
  Filled 2012-05-28: qty 1

## 2012-05-28 MED ORDER — ACETAMINOPHEN 325 MG PO TABS: 650.0000 mg | ORAL_TABLET | Freq: Four times a day (QID) | ORAL | Status: DC | PRN

## 2012-05-28 NOTE — Evaluation (Addendum)
Physical Therapy Evaluation Patient Details Name: [name] MRN: [id number] DOB: 07/24/1932 Today's Date: 05/28/2012 Time: [id number] PT Time Calculation (min): 36 min  PT Assessment / Plan / Recommendation Clinical Impression   pt will benefit from HHPT to maximize independence.    PT Assessment  All further PT needs can be met in the next venue of care    Follow Up Recommendations  Home health PT    Barriers to Discharge        Equipment Recommendations  None recommended by PT    Recommendations for Other Services     Frequency      Precautions / Restrictions Precautions Precautions: Fall   Pertinent Vitals/Pain      Mobility  Bed Mobility Bed Mobility: Supine to Sit;Sit to Supine Supine to Sit: 7: Independent Sit to Supine: 7: Independent Transfers Transfers: Sit to Stand;Stand to Sit Sit to Stand: 5: Supervision Stand to Sit: 5: Supervision Details for Transfer Assistance: verbal cues for hand placement Ambulation/Gait Ambulation/Gait Assistance: 4: Min guard;5: Supervision Ambulation Distance (Feet): 250 Feet Assistive device: Rolling walker Gait Pattern: Step-through pattern    Exercises     PT Diagnosis:    PT Problem List:   PT Treatment Interventions:     PT Goals    Visit Information  Last PT Received On: 05/28/12    Subjective Data  Subjective: walked once down the hall Patient Stated Goal: home   Prior [place] Lives With: Spouse Available Help at Discharge: Family;Available PRN/intermittently Type of Home: House Home Access: Stairs to enter [employer] of Steps: 4 Entrance Stairs-Rails: Right Home Layout: One level;Laundry or work area in basement;Able to live on main level with bedroom/bathroom Home Adaptive Equipment: Walker - rolling;Bedside commode/3-in-1 Additional Comments: pt work area in basement Prior Function Level of Independence: Independent Communication Communication: No  difficulties    Cognition  Overall Cognitive Status: Appears within functional limits for tasks assessed/performed Arousal/Alertness: Awake/alert Orientation Level: Oriented X4 / Intact Behavior During Session: [facility] for tasks performed    Extremity/Trunk Assessment Right Upper Extremity Assessment RUE ROM/Strength/Tone: [facility] for tasks assessed Left Upper Extremity Assessment LUE ROM/Strength/Tone: [facility] for tasks assessed Right Lower Extremity Assessment RLE ROM/Strength/Tone: [facility] for tasks assessed Left Lower Extremity Assessment LLE ROM/Strength/Tone: WFL for tasks assessed   Balance Static Standing Balance Static Standing - Balance Support: No upper extremity supported Static Standing - Level of Assistance: 5: Stand by assistance  End of Session PT - End of Session Equipment Utilized During Treatment: Gait belt Activity Tolerance: Patient tolerated treatment well Patient left: in bed;with call bell/phone within reach;with family/visitor present Nurse Communication: Mobility status (pt will need HHPT)  GP     [facility] 05/28/2012, 3:06 PM

## 2012-05-28 NOTE — Care Management Note (Signed)
    Page 1 of 1   05/28/2012     3:10:53 PM   CARE MANAGEMENT NOTE 05/28/2012  Patient:  [name], [name]   Account Number:  [id number]  Date Initiated:  05/28/2012  Documentation initiated by:  Dessa Phi  Subjective/Objective Assessment:   ADMITTED W/AMS.BALDDER CA.HAS F/C.     Action/Plan:   FROM HOME W/SPOUSE.HAS RW.   Anticipated DC Date:  05/28/2012   Anticipated DC Plan:  [place]  CM consult      Choice offered to / List presented to:  C-3 Spouse        HH arranged  HH-2 PT      [place].   Status of service:  Completed, signed off Medicare Important Message given?   (If response is "NO", the following Medicare IM given date fields will be blank) Date Medicare IM given:   Date Additional Medicare IM given:    Discharge Disposition:  [place]  Per UR Regulation:  Reviewed for med. necessity/level of care/duration of stay  If discussed at [place] of Stay Meetings, dates discussed:    Comments:  05/28/12 Maryah Marinaro RN,BSN NCM [place] D/C HOME W/HHPT.

## 2012-05-28 NOTE — Discharge Summary (Signed)
Physician Discharge Summary  DISCHARGE SUMMARY   Patient ID: [name] MR#: [id number] DOB/AGE: Aug 11, 1932 78 y.o.   Attending 33 M  Patient's [id number] M, MD  Consults:Treatment Team:  Molli Hazard, MD**  Admit date: 05/26/2012 Discharge date: 05/28/2012  Discharge Diagnoses:  Principal Problem:  *Confusion Active Problems:  Bladder tumor  UTI (lower urinary tract infection)  Acute kidney injury  Fever  Dehydration   Patient Active Problem List  Diagnosis  . MIXED HYPERLIPIDEMIA  . ESSENTIAL HYPERTENSION, BENIGN  . CAD, NATIVE VESSEL  . CARDIOMYOPATHY, ISCHEMIC  . ATRIAL FIBRILLATION  . SYSTOLIC HEART FAILURE, CHRONIC  . CHEST PAIN  . ICD-CRT-St. Jude  . CAROTID ARTERY DISEASE  . Knee pain, acute, right  . Bladder tumor  . UTI (lower urinary tract infection)  . Confusion  . Acute kidney injury  . Fever  . Dehydration   Past Medical History  Diagnosis Date  . CAD (coronary artery disease)      a. s/p anterior MI 12/05 c/b shock -> stent LAD   b. s/p stenting OM-1, 2/06  . CHF (congestive heart failure)     due to ischemic CM  a. EF 20-30%. (Nov 2008)   b. s/p St. Jude BiV-ICD    c. CPX 07/2008  pvo2 16.3 (63% predicted) slope 34 RER 1.08 O2 pulse 93%  . Atrial fibrillation or flutter     maintaining sinus on amiodarone  --PFTS, TFTs ok 8/10  . CRI (chronic renal insufficiency)     (baseline 2.0-2.2)  . Hyperlipidemia   . Hypothyroidism   . GERD (gastroesophageal reflux disease)     hx Barretts esophagitis  . Anemia   . 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 Myocardial infarction 09/15/2004  . History of blood transfusion     following coumadin  usage  . HTN (hypertension)     EKG 4/13 EPIC, LOV Dr Ronda Fairly with note 05/18/12,  pre Dr Caryl Comes- "stable cardiac perspective" with ICD orders on chart, last interrogation 05/14/12 EPIC  . Cough     x 1 month- productive, no  fever  . ICD (implantable cardiac defibrillator) in place     BiV/ICD  . Skin cancer     basal cells   facial x 4, 1 right forearm  . Bladder cancer dx'd 05/2012    Discharged Condition: Better  Discharge Medications: Medication List  As of 05/28/2012  3:50 PM   STOP taking these medications         ciprofloxacin 500 MG tablet      HYDROcodone-acetaminophen 5-325 MG per tablet      hyoscyamine 0.125 MG tablet      oxybutynin 5 MG tablet         TAKE these medications         acetaminophen 325 MG tablet   Commonly known as: TYLENOL   Take 2 tablets (650 mg total) by mouth every 6 (six) hours as needed (or Fever >/= 101).      amiodarone 200 MG tablet   Commonly known as: PACERONE   Take 100 mg by mouth daily with breakfast.      aspirin EC 325 MG tablet   Take 325 mg by mouth daily.      carvedilol 6.25 MG tablet   Commonly known as: COREG   Take 6.25 mg by mouth 2 (two) times daily with a meal.      clopidogrel 75  MG tablet   Commonly known as: PLAVIX   Take 75 mg by mouth daily.      [employer] IRON 325 (65 FE) MG tablet   Generic drug: ferrous sulfate   Take 325 mg by mouth 3 (three) times a week. 3 times a week      digoxin 0.125 MG tablet   Commonly known as: LANOXIN   Take 0.25 mg by mouth every morning. 1/2 tablet- .125 mg  tablets      ezetimibe 10 MG tablet   Commonly known as: ZETIA   Take 10 mg by mouth daily with supper.      furosemide 20 MG tablet   Commonly known as: LASIX   Take 20 mg by mouth daily with breakfast.      levofloxacin 250 MG tablet   Commonly known as: LEVAQUIN   Take 1 tablet (250 mg total) by mouth daily.      levothyroxine 50 MCG tablet   Commonly known as: SYNTHROID, LEVOTHROID   Take 50 mcg by mouth daily before breakfast.      multivitamin with minerals Tabs   Take 0.5 tablets by mouth 2 (two) times daily.      neomycin-bacitracin-polymyxin ointment   Commonly known as: NEOSPORIN   Apply 1 application topically 3  (three) times daily. apply to tip of penis.      nitroGLYCERIN 0.4 MG SL tablet   Commonly known as: NITROSTAT   Place 0.4 mg under the tongue every 5 (five) minutes as needed. For chest pain      pantoprazole 40 MG tablet   Commonly known as: PROTONIX   Take 1 tablet by mouth daily with breakfast.      phenazopyridine 100 MG tablet   Commonly known as: PYRIDIUM   Take 100 mg by mouth every 8 (eight) hours as needed. For bladder pain      potassium chloride 10 MEQ tablet   Commonly known as: K-DUR   Take 10 mEq by mouth daily with breakfast.      rosuvastatin 20 MG tablet   Commonly known as: CRESTOR   Take 20 mg by mouth daily with supper.      valsartan 80 MG tablet   Commonly known as: DIOVAN   Take 40 mg by mouth 2 (two) times daily.            Hospital Procedures: Dg Chest 2 View  05/19/2012  *RADIOLOGY REPORT*  Clinical Data: Preoperative evaluation.  History of cough for 1 month.  CHEST - 2 VIEW  Comparison: Multiple priors, most recently 07/03/2011.  Findings: Lung volumes are normal.  No consolidative airspace disease.  No pleural effusions.  Bilateral apical pleuroparenchymal thickening (right greater than left) is unchanged compared to numerous prior examinations, and compatible with scarring.  Focal area of pleural-based scarring in the lateral aspect of the right mid hemithorax is unchanged, and corresponds to a focal area of pleural thickening demonstrated on remote prior CT scans 05/06/2005.  No definite suspicious appearing pulmonary nodules or masses are identified.  Pulmonary vasculature is normal. Cardiomediastinal silhouette is within normal limits. Atherosclerosis of the thoracic aorta.  Left-sided biventricular pacemaker/AICD with lead tips projecting over the expected location of the right atrium, right ventricular apex and overlying the lateral wall of the left ventricle (via the coronary sinus and coronary veins).  IMPRESSION: 1.  No radiographic evidence of  acute cardiopulmonary disease. 2.  Radiographic appearance of the chest is essentially unchanged, as detailed above.  Original  Report Authenticated By: Etheleen Mayhew, M.D.   Dg Abd 1 View  05/27/2012  *RADIOLOGY REPORT*  Clinical Data: Generalized abdominal pain.  Abdominal distention.  ABDOMEN - 1 VIEW  Comparison: None  Findings: Bilateral ureteral stents in place.  There is diffuse gaseous distention of bowel suggesting ileus.  No definite bowel obstruction.  No free air or visible organomegaly.  No acute bony abnormality.  IMPRESSION: Diffuse gaseous distention of bowel suggests ileus.  Bilateral ureteral stents.  Original Report Authenticated By: Raelyn Number, M.D.   Ct Head Wo Contrast  05/26/2012  *RADIOLOGY REPORT*  Clinical Data: Altered mental status.  CT HEAD WITHOUT CONTRAST  Technique:  Contiguous axial images were obtained from the base of the skull through the vertex without contrast.  Comparison: None.  Findings: No mass lesion, mass effect, midline shift, hydrocephalus, hemorrhage.  No acute territorial cortical ischemia/infarct. Atrophy and chronic ischemic white matter disease is present.  Benign basal ganglia calcifications are present.  Mild intracranial atherosclerosis.  Mastoid air cells are clear.  The paranasal sinuses appear normal.  IMPRESSION: Atrophy and chronic ischemic white matter disease without acute intracranial abnormality.  Original Report Authenticated By: Dereck Ligas, M.D.   Ct Pelvis Wo Contrast  05/27/2012  *RADIOLOGY REPORT*  Clinical Data:  Evaluate for potential bladder perforation.  The patient is postoperative day #2 status post resection of bladder tumor.  CT PELVIS WITHOUT CONTRAST  Technique:  Multidetector CT imaging of the pelvis was performed following the standard protocol without intravenous contrast. 80 ml of dilute Omnipaque-300 was administered via the Foley catheter.  Comparison:  No priors.  Findings:  Pelvis: The urinary bladder is nearly  completely decompressed, with a Foley balloon catheter in place.  There are several small outpouchings of contrast into the urinary bladder wall, compatible with bladder wall diverticula.  However, there is no frank extravasation of contrast outside the confines of the urinary bladder wall to suggest bladder wall perforation. The distal aspects of bilateral ureteral stents are noted, with the distal loops properly reformed within the urinary bladder.  Normal appendix.  Numerous colonic diverticula are noted within the visualized portions of the pelvis, without surrounding inflammatory changes to suggest acute diverticulitis at this time. Atherosclerosis is noted in the visualized abdominal and pelvic vasculature.  Musculoskeletal: There are no aggressive appearing lytic or blastic lesions noted in the visualized portions of the skeleton.  IMPRESSION: 1.  No evidence of bladder perforation. 2.  Multiple small bladder wall diverticula. 3.  Colonic diverticulosis without evidence to suggest acute diverticulitis within the visualized portions of the pelvis. 4.  Bilateral double J ureteral stents are in place, as above. 5.  Atherosclerosis.  Original Report Authenticated By: Etheleen Mayhew, M.D.   US Renal  05/27/2012  *RADIOLOGY REPORT*  Clinical Data: Indwelling bilateral ureteral stents.  2 days post resection of bladder tumor.  RENAL/URINARY TRACT ULTRASOUND COMPLETE  Comparison:  CT cystogram obtained same date.  No prior renal imaging.  Findings:  Right Kidney:  No hydronephrosis.  Mild diffuse cortical thinning. No focal parenchymal abnormality.  Normal parenchymal echotexture. No shadowing calculi.  Approximately 10.6 cm in length.  Left Kidney:  No hydronephrosis.  Mild diffuse cortical thinning. No focal parenchymal abnormality.  Normal parenchymal echotexture. No shadowing calculi.  Approximate 9.2 cm in length.  Bladder:  Decompressed by Foley catheter.  IMPRESSION: No evidence of hydronephrosis  involving either kidney.  Mild diffuse cortical thinning involving both kidneys consistent with age.  Original Report Authenticated  By: Deniece Portela, M.D.    History of Present Illness: 23 Male with MMP who was admitted to my service one day post op Cystoscopy, Transurethral resection of bladder tumor larger than 2 cm less than 5 cm, Bilateral retrograde pyelogram, Bilateral ureteral stent placement and Fluoroscopy per Uro Dr [name].  He presented to the ED with confusion and messing with the foley. He was found to have temp 101 rectal, Dehydration and ARF. Urology was called first to admit but felt the pt would benefit from medicine admit. He was given IVF and ABx. He had a bladder scan showing 80-100ccs of urine and not obstructed.   Hospital Course: He was admitted, hydrated, provided IV ABx, Uro Consulted, meds adjusted and tests ran.  AMS- Multifactorial from infection?, post op care, Medicines, DeH, and ARF. We held Narcotics, levsin, and Ditropan with the confusion. CCT (-).  He improved.  Continue Abx for UTI? = I discussed with Uro and will D/C on 5 days Levaquin. Cx P.   H/O CAD s/p PTCIs and previous large anterior wall myocardial infarction 12/05. ASA and Plavix on hold for 5-7 days. No Angina.   CHF c EF 25% watch for vol Overload. Now with wet cough. Cr down a lot - give 40 lasix now, then resume outpt lasix dose. IV just Hep locked.   S/P St. Jude BiV ICD for V Tachy- recent interogation. Dr Sung Amabile.   Parox AFib but maintaining NSR rhythm on amiodarone. He is not on Coumadin secondary to GI bleed.   CKDz/CRI - baseline creatinine about 1.5-2.1. 04/29/12 Cr 2.0. Currently 2.8 - 2.3 - 1.66. S/P Gentle hydration.   HTN - BP Better post fluids. On Home meds  Bladder cancer - Post Op Day 3 - S/P foley irrigation and Bladder scan. S/p tests yesterday per Dr [name]. CT Pelvis and [place] dose yesterday revealed no new issues. He has appt with Dr [name] next week.     Chronic Anemia. Hbg 12.1 to 11.2 to 10.9. post fluids - expected.   Barrett"s esophagus/GERD/Hx of gastrointestinal bleeding/PUD - PPI   Hypothyroid - on meds   SCDs for DVT Proph was provided- cannot use anticoagulants.   Per URO's note this am:  Constipation- Dulcolax suppository.  Paraphimosis- advised patient and nurse regarding keeping foreskin reduced.  Fever- unsure of origin. Could be UTI, but doubtful. Would discharge home on low dose antibiotic prophylaxis.  Hematuria- appropriate for this stage of recovery  The plan when I left him this am was "He has been walking and OK for D/c @ lunchtime."  I was called @ 1 pm and he had no BM after the suppository and Fleets enema was given.  The lasix caused 2.6 L UOP and his breathing improved nicely.  MS remained fine.  He walked with nursing and was unstable with it - PT was ordered.   PT saw pt and recommended HHPT.  Will be set up.  Also the Enema did not work - he can go home and push bowel prep.    Day of Discharge Exam BP 145/71  Pulse 66  Temp 98.7 F (37.1 C) (Oral)  Resp 18  Ht 5\' 10"  (1.778 m)  Wt 83.7 kg (184 lb 8.4 oz)  BMI 26.48 kg/m2  SpO2 95%  See this ams PE note  Discharge Labs:  [facility] 05/28/12 0423 05/27/12 0505  NA 141 140  K 4.3 3.9  CL 108 106  CO2 27 23  GLUCOSE 118* 106*  BUN  21 29*  CREATININE 1.66* 2.34*  CALCIUM 8.6 8.2*  MG -- --  PHOS -- --    Basename 05/27/12 0505  AST 26  ALT 11  ALKPHOS 47  BILITOT 0.4  PROT 6.2  ALBUMIN 3.1*    Basename 05/28/12 0423 05/27/12 0505 05/26/12 2132  WBC 9.9 10.4 --  NEUTROABS -- -- 10.5*  HGB 10.9* 11.2* --  HCT 33.5* 34.3* --  MCV 92.5 92.2 --  PLT 183 180 --   No results found for this basename: CKTOTAL:3,CKMB:3,CKMBINDEX:3,TROPONINI:3 in the last 72 hours No results found for this basename: TSH,T4TOTAL,FREET3,T3FREE,THYROIDAB in the last 72 hours No results found for this basename:  VITAMINB12:2,FOLATE:2,FERRITIN:2,TIBC:2,IRON:2,RETICCTPCT:2 in the last 72 hours Lab Results  Component Value Date   INR 1.0 RATIO 01/25/2008       Discharge instructions:  01-Home or Self Care Follow-up Information    Follow up with [name], MD in 2 weeks.   Contact information:   [place], [place]. [place] [phone]       Follow up with Molli Hazard, MD in 1 week.   Contact information:   [facility] [place] [phone]           Disposition: Home  Follow-up Appts: Follow-up with Dr. Virgina Jock at [facility] in 2 weeks.  Call for appointment.  Condition on Discharge: stable, but cough  Tests Needing Follow-up: I will follow up labs in 2 weeks.  Time spent in discharge (includes decision making & examination of pt): 30 min  Signed: Cliffton Spradley M 05/28/2012, 3:50 PM

## 2012-05-28 NOTE — Progress Notes (Signed)
Subjective: At  Baseline. No More fever. S/P uro W/Up I just talked c Dr [name] and We will work on D/C today. New cough...chest clear.   Objective: Vital signs in last 24 hours: Temp:  [98.6 F (37 C)-99.1 F (37.3 C)] 99.1 F (37.3 C) (08/15 0532) Pulse Rate:  [66] 66  (08/15 0532) Resp:  [18] 18  (08/15 0532) BP: (108-112)/(57-62) 108/57 mmHg (08/15 0532) SpO2:  [92 %-94 %] 94 % (08/15 0532) Weight change:  Last BM Date: 05/25/12  CBG (last 3)  No results found for this basename: GLUCAP:3 in the last 72 hours  Intake/Output from previous day:  Intake/Output Summary (Last 24 hours) at 05/28/12 0736 Last data filed at 05/28/12 0653  Gross per 24 hour  Intake  902.5 ml  Output   2350 ml  Net -1447.5 ml   08/14 0701 - 08/15 0700 In: 902.5 [P.O.:240; I.V.:562.5; IV Piggyback:100] Out: 2350 [Urine:2350]   Physical Exam  General appearance: looks fine  OP - Moist Throat: lips, mucosa, and tongue dry  Neck: no adenopathy, no carotid bruit, no JVD and thyroid not enlarged, symmetric, no tenderness/mass/nodules  Resp: CTA  Cardio: Reg/paced. [name]. AICD  GI: Distended.  Mild Tender soft,  bowel sounds normal; no masses, no organomegaly  Foley draining clearer fluid Extremities: extremities normal,  no cyanosis or edema  2nd finger removal after table saw injury-1969  Pulses: 2+ and symmetric  Lymph nodes: no cervical lymphadenopathy  Neurologic: Alert and oriented person and my name. He is a better mentally, normal strength and tone. Normal symmetric reflexes   Lab Results:  Basename 05/28/12 0423 05/27/12 0505  NA 141 140  K 4.3 3.9  CL 108 106  CO2 27 23  GLUCOSE 118* 106*  BUN 21 29*  CREATININE 1.66* 2.34*  CALCIUM 8.6 8.2*  MG -- --  PHOS -- --     Basename 05/27/12 0505  AST 26  ALT 11  ALKPHOS 47  BILITOT 0.4  PROT 6.2  ALBUMIN 3.1*     Basename 05/28/12 0423 05/27/12 0505 05/26/12 2132  WBC 9.9 10.4 --  NEUTROABS -- -- 10.5*  HGB  10.9* 11.2* --  HCT 33.5* 34.3* --  MCV 92.5 92.2 --  PLT 183 180 --    Lab Results  Component Value Date   INR 1.0 RATIO 01/25/2008    No results found for this basename: CKTOTAL:3,CKMB:3,CKMBINDEX:3,TROPONINI:3 in the last 72 hours  No results found for this basename: TSH,T4TOTAL,FREET3,T3FREE,THYROIDAB in the last 72 hours  No results found for this basename: VITAMINB12:2,FOLATE:2,FERRITIN:2,TIBC:2,IRON:2,RETICCTPCT:2 in the last 72 hours  Micro Results: Recent Results (from the past 240 hour(s))  SURGICAL PCR SCREEN     Status: Abnormal   Collection Time   05/19/12  9:52 AM      Component Value Range Status Comment   MRSA, PCR NEGATIVE  NEGATIVE Final    Staphylococcus aureus POSITIVE (*) NEGATIVE Final      Studies/Results: Dg Abd 1 View  05/27/2012  *RADIOLOGY REPORT*  Clinical Data: Generalized abdominal pain.  Abdominal distention.  ABDOMEN - 1 VIEW  Comparison: None  Findings: Bilateral ureteral stents in place.  There is diffuse gaseous distention of bowel suggesting ileus.  No definite bowel obstruction.  No free air or visible organomegaly.  No acute bony abnormality.  IMPRESSION: Diffuse gaseous distention of bowel suggests ileus.  Bilateral ureteral stents.  Original Report Authenticated By: Raelyn Number, [name].D.   Ct Head Wo Contrast  05/26/2012  *RADIOLOGY REPORT*  Clinical Data: Altered mental status.  CT HEAD WITHOUT CONTRAST  Technique:  Contiguous axial images were obtained from the base of the skull through the vertex without contrast.  Comparison: None.  Findings: No mass lesion, mass effect, midline shift, hydrocephalus, hemorrhage.  No acute territorial cortical ischemia/infarct. Atrophy and chronic ischemic white matter disease is present.  Benign basal ganglia calcifications are present.  Mild intracranial atherosclerosis.  Mastoid air cells are clear.  The paranasal sinuses appear normal.  IMPRESSION: Atrophy and chronic ischemic white matter disease without  acute intracranial abnormality.  Original Report Authenticated By: Dereck Ligas, [name].D.   Ct Pelvis Wo Contrast  05/27/2012  *RADIOLOGY REPORT*  Clinical Data:  Evaluate for potential bladder perforation.  The patient is postoperative day #2 status post resection of bladder tumor.  CT PELVIS WITHOUT CONTRAST  Technique:  Multidetector CT imaging of the pelvis was performed following the standard protocol without intravenous contrast. 80 ml of dilute Omnipaque-300 was administered via the Foley catheter.  Comparison:  No priors.  Findings:  Pelvis: The urinary bladder is nearly completely decompressed, with a Foley balloon catheter in place.  There are several small outpouchings of contrast into the urinary bladder wall, compatible with bladder wall diverticula.  However, there is no frank extravasation of contrast outside the confines of the urinary bladder wall to suggest bladder wall perforation. The distal aspects of bilateral ureteral stents are noted, with the distal loops properly reformed within the urinary bladder.  Normal appendix.  Numerous colonic diverticula are noted within the visualized portions of the pelvis, without surrounding inflammatory changes to suggest acute diverticulitis at this time. Atherosclerosis is noted in the visualized abdominal and pelvic vasculature.  Musculoskeletal: There are no aggressive appearing lytic or blastic lesions noted in the visualized portions of the skeleton.  IMPRESSION: 1.  No evidence of bladder perforation. 2.  Multiple small bladder wall diverticula. 3.  Colonic diverticulosis without evidence to suggest acute diverticulitis within the visualized portions of the pelvis. 4.  Bilateral double J ureteral stents are in place, as above. 5.  Atherosclerosis.  Original Report Authenticated By: Etheleen Mayhew, [name].D.   US Renal  05/27/2012  *RADIOLOGY REPORT*  Clinical Data: Indwelling bilateral ureteral stents.  2 days post resection of bladder tumor.   RENAL/URINARY TRACT ULTRASOUND COMPLETE  Comparison:  CT cystogram obtained same date.  No prior renal imaging.  Findings:  Right Kidney:  No hydronephrosis.  Mild diffuse cortical thinning. No focal parenchymal abnormality.  Normal parenchymal echotexture. No shadowing calculi.  Approximately 10.6 cm in length.  Left Kidney:  No hydronephrosis.  Mild diffuse cortical thinning. No focal parenchymal abnormality.  Normal parenchymal echotexture. No shadowing calculi.  Approximate 9.2 cm in length.  Bladder:  Decompressed by Foley catheter.  IMPRESSION: No evidence of hydronephrosis involving either kidney.  Mild diffuse cortical thinning involving both kidneys consistent with age.  Original Report Authenticated By: Deniece Portela, [name].D.     Medications: Scheduled:    . amiodarone  100 mg Oral Q breakfast  . atorvastatin  40 mg Oral q1800  . bisacodyl  10 mg Rectal BID  . carvedilol  6.25 mg Oral BID WC  . cefTRIAXone (ROCEPHIN)  IV  1 g Intravenous Q24H  . ciprofloxacin  250 mg Oral BID  . digoxin  0.25 mg Oral q morning - 10a  . ezetimibe  10 mg Oral Q supper  . ferrous sulfate  325 mg Oral 3 times weekly  . furosemide  20 mg  Oral Q breakfast  . irbesartan  75 mg Oral Daily  . levothyroxine  50 mcg Oral QAC breakfast  . multivitamin with minerals  1 tablet Oral BID  . neomycin-bacitracin-polymyxin  1 application Topical TID  . pantoprazole  40 mg Oral Q breakfast  . DISCONTD: phenazopyridine  100 mg Oral Q8H   Continuous:    . sodium chloride 30 mL/hr at 05/27/12 [id number]     Assessment/Plan: Principal Problem:  *Confusion Active Problems:  Bladder tumor  UTI (lower urinary tract infection)  Acute kidney injury  Fever  Dehydration  AMS- Multifactorial from infection?, post op care,  Medicines, DeH, and ARF. Hold Narcotics, levsin, and Ditropan with the confusion.  CCT (-)   Continue Abx for UTI? =  I discussed with Uro and will D/C on 5 days Levaquin.  Cx P.   H/O CAD s/p  PTCIs and previous large anterior wall myocardial infarction 12/05. ASA and Plavix on hold for 5-7 days. No Angina.   CHF c EF 25% watch for vol Overload. Now with wet cough.  Cr down a lot - give 40 lasix now.  Hep lock IVF  S/P St. Jude BiV ICD for V Tachy- recent interogation. Dr Sung Amabile.   Parox AFib but maintaining NSR rhythm on amiodarone. He is not on Coumadin secondary to GI bleed.   CKDz/CRI - baseline creatinine about 1.5-2.1. 04/29/12 Cr 2.0. Currently 2.8 - 2.3 - 1.66. S/P Gentle hydration.  HTN - BP Better post fluids. Home meds written with parameters.   Bladder cancer - Post Op Day 3 - S/P foley irrigation and Bladder scan.  S/p tests yesterday per Dr [name].  Chronic Anemia. Hbg 12.1 to 11.2 to 10.9. post fluids - expected.  Barrett"s esophagus/GERD/Hx of gastrointestinal bleeding/PUD - PPI   Hypothyroid - on meds  SCDs for DVT Proph - cannot use anticoagulants.   He has been walking  Per URO: Constipation- Dulcolax suppository.  Paraphimosis- advised patient and nurse regarding keeping foreskin reduced.  Fever- unsure of origin. Could be UTI, but doubtful. Would discharge home on low dose antibiotic prophylaxis.  Hematuria- appropriate for this stage of recovery    ID -  Anti-infectives     Start     Dose/Rate Route Frequency Ordered Stop   05/27/12 0000   ciprofloxacin (CIPRO) tablet 250 mg        250 mg Oral 2 times daily 05/26/12 2354     05/27/12 0000   cefTRIAXone (ROCEPHIN) 1 g in dextrose 5 % 50 mL IVPB        1 g 100 mL/hr over 30 Minutes Intravenous Every 24 hours 05/26/12 2354              LOS: 2 days   [name] 05/28/2012, 7:36 AM

## 2012-05-28 NOTE — Progress Notes (Signed)
Urology Progress Note  Subjective:     No acute urologic events overnight. Complains of productive cough. No BM since Monday. Urine more clear today. Has some bladder spasms.  I reviewed the pathology report with the patient & his son. I explained the findings and explained I would recommend repeat TURBT in 6-8 weeks to confirm the diagnosis.     ROS: Negative: SOB  Objective:  Patient Vitals for the past 24 hrs:  BP Temp Temp src Pulse Resp SpO2  05/28/12 0532 108/57 mmHg 99.1 F (37.3 C) Oral 66  18  94 %  05/27/12 2138 112/62 mmHg 98.6 F (37 C) Oral 66  18  92 %  05/27/12 1312 110/61 mmHg 98.7 F (37.1 C) Oral 66  18  94 %    Physical Exam: General:  No acute distress, awake Cardiovascular:    [x]   S1/S2 present, RRR  []   Irregularly irregular Chest:  CTA-B Abdomen:               []  Soft, appropriately TTP  [x]  Soft, NTTP  []  Soft, appropriately TTP, incision(s) clean/dry/intact  Genitourinary: Foley in place. Foreskin retracted- reduced with ease. Foley:  Draining pink fluid. No clots.    I/O last 3 completed shifts: In: 902.5 [P.O.:240; I.V.:562.5; IV Piggyback:100] Out: 3800 [Urine:3800]  Recent Labs  [facility] 05/28/12 0423 05/27/12 0505   HGB 10.9* 11.2*   WBC 9.9 10.4   PLT 183 180    Recent Labs  Basename 05/28/12 0423 05/27/12 0505   NA 141 140   K 4.3 3.9   CL 108 106   CO2 27 23   BUN 21 29*   CREATININE 1.66* 2.34*   CALCIUM 8.6 8.2*   GFRNONAA 38* 25*   GFRAA 44* 29*     No results found for this basename: PT:2,INR:2,APTT:2 in the last 72 hours   No components found with this basename: ABG:2    Length of stay: 2 days.  Assessment: Hematuria. Confusion. Fever of unknown origin.   Plan: Constipation- Dulcolax suppository. Paraphimosis- advised patient and nurse regarding keeping foreskin reduced. Fever- unsure of origin. Could be UTI, but doubtful. Would discharge home on low dose antibiotic prophylaxis. Hematuria- appropriate  for this stage of recovery.  OK for discharge home from GU point of view. Keep appointment with me next week for further discussion of bladder cancer management.   Rolan Bucco, MD [phone]

## 2012-06-02 LAB — CULTURE, BLOOD (ROUTINE X 2): Culture: NO GROWTH

## 2012-06-03 ENCOUNTER — Other Ambulatory Visit: Payer: Self-pay | Admitting: Internal Medicine

## 2012-06-03 DIAGNOSIS — I509 Heart failure, unspecified: Secondary | ICD-10-CM | POA: Diagnosis not present

## 2012-06-03 DIAGNOSIS — N390 Urinary tract infection, site not specified: Secondary | ICD-10-CM | POA: Diagnosis not present

## 2012-06-03 DIAGNOSIS — C679 Malignant neoplasm of bladder, unspecified: Secondary | ICD-10-CM | POA: Diagnosis not present

## 2012-06-03 DIAGNOSIS — I2589 Other forms of chronic ischemic heart disease: Secondary | ICD-10-CM | POA: Diagnosis not present

## 2012-06-03 DIAGNOSIS — R269 Unspecified abnormalities of gait and mobility: Secondary | ICD-10-CM | POA: Diagnosis not present

## 2012-06-03 DIAGNOSIS — IMO0001 Reserved for inherently not codable concepts without codable children: Secondary | ICD-10-CM | POA: Diagnosis not present

## 2012-06-03 NOTE — Telephone Encounter (Signed)
Refilled furosemide

## 2012-06-04 DIAGNOSIS — R310 Gross hematuria: Secondary | ICD-10-CM | POA: Diagnosis not present

## 2012-06-04 DIAGNOSIS — C670 Malignant neoplasm of trigone of bladder: Secondary | ICD-10-CM | POA: Diagnosis not present

## 2012-06-12 DIAGNOSIS — Z23 Encounter for immunization: Secondary | ICD-10-CM | POA: Diagnosis not present

## 2012-06-12 DIAGNOSIS — I10 Essential (primary) hypertension: Secondary | ICD-10-CM | POA: Diagnosis not present

## 2012-06-12 DIAGNOSIS — C679 Malignant neoplasm of bladder, unspecified: Secondary | ICD-10-CM | POA: Diagnosis not present

## 2012-06-19 ENCOUNTER — Other Ambulatory Visit: Payer: Self-pay | Admitting: Urology

## 2012-06-25 DIAGNOSIS — C679 Malignant neoplasm of bladder, unspecified: Secondary | ICD-10-CM | POA: Diagnosis not present

## 2012-06-25 DIAGNOSIS — R82998 Other abnormal findings in urine: Secondary | ICD-10-CM | POA: Diagnosis not present

## 2012-06-26 DIAGNOSIS — I10 Essential (primary) hypertension: Secondary | ICD-10-CM | POA: Diagnosis not present

## 2012-06-26 DIAGNOSIS — C679 Malignant neoplasm of bladder, unspecified: Secondary | ICD-10-CM | POA: Diagnosis not present

## 2012-06-29 ENCOUNTER — Encounter (HOSPITAL_COMMUNITY): Payer: Self-pay | Admitting: Pharmacy Technician

## 2012-07-02 ENCOUNTER — Encounter (HOSPITAL_COMMUNITY): Payer: Self-pay

## 2012-07-02 ENCOUNTER — Encounter (HOSPITAL_COMMUNITY)
Admission: RE | Admit: 2012-07-02 | Discharge: 2012-07-02 | Disposition: A | Discharge status: Home / Self Care | Payer: Medicare Other | Source: Ambulatory Visit | Attending: Urology | Admitting: Urology

## 2012-07-02 DIAGNOSIS — N3289 Other specified disorders of bladder: Secondary | ICD-10-CM | POA: Diagnosis not present

## 2012-07-02 DIAGNOSIS — Z01812 Encounter for preprocedural laboratory examination: Secondary | ICD-10-CM | POA: Diagnosis not present

## 2012-07-02 DIAGNOSIS — E039 Hypothyroidism, unspecified: Secondary | ICD-10-CM | POA: Diagnosis not present

## 2012-07-02 DIAGNOSIS — C679 Malignant neoplasm of bladder, unspecified: Secondary | ICD-10-CM | POA: Diagnosis not present

## 2012-07-02 DIAGNOSIS — I129 Hypertensive chronic kidney disease with stage 1 through stage 4 chronic kidney disease, or unspecified chronic kidney disease: Secondary | ICD-10-CM | POA: Diagnosis not present

## 2012-07-02 DIAGNOSIS — K219 Gastro-esophageal reflux disease without esophagitis: Secondary | ICD-10-CM | POA: Diagnosis not present

## 2012-07-02 DIAGNOSIS — Z95810 Presence of automatic (implantable) cardiac defibrillator: Secondary | ICD-10-CM | POA: Diagnosis not present

## 2012-07-02 DIAGNOSIS — Z79899 Other long term (current) drug therapy: Secondary | ICD-10-CM | POA: Diagnosis not present

## 2012-07-02 DIAGNOSIS — N189 Chronic kidney disease, unspecified: Secondary | ICD-10-CM | POA: Diagnosis not present

## 2012-07-02 DIAGNOSIS — E785 Hyperlipidemia, unspecified: Secondary | ICD-10-CM | POA: Diagnosis not present

## 2012-07-02 DIAGNOSIS — G4730 Sleep apnea, unspecified: Secondary | ICD-10-CM | POA: Diagnosis not present

## 2012-07-02 DIAGNOSIS — N2889 Other specified disorders of kidney and ureter: Secondary | ICD-10-CM | POA: Diagnosis not present

## 2012-07-02 HISTORY — DX: Sleep apnea, unspecified: G47.30

## 2012-07-02 LAB — CBC
HCT: 36.6 % — ABNORMAL LOW (ref 39.0–52.0)
Hemoglobin: 11.8 g/dL — ABNORMAL LOW (ref 13.0–17.0)
MCH: 29.4 pg (ref 26.0–34.0)
MCHC: 32.2 g/dL (ref 30.0–36.0)
MCV: 91.3 fL (ref 78.0–100.0)

## 2012-07-02 LAB — BASIC METABOLIC PANEL
BUN: 25 mg/dL — ABNORMAL HIGH (ref 6–23)
Calcium: 9.1 mg/dL (ref 8.4–10.5)
GFR calc non Af Amer: 43 mL/min — ABNORMAL LOW (ref >90)
Glucose, Bld: 100 mg/dL — ABNORMAL HIGH (ref 70–99)

## 2012-07-02 NOTE — Patient Instructions (Signed)
Nicholson  07/02/2012   Your procedure is scheduled on:  07/08/12  Wednesday  Surgery 1400-1600       2PM- 4PM  Report to [facility] at   1130    AM.  Call this number if you have problems the morning of surgery: [phone]     Or PST   [id number]  [facility]   Remember:   Do not eat food:After Midnight. Tuesday NIGHT  May have clear liquids: until 0530 am  Wednesday MORNING THEN  NONE  Clear liquids include soda, tea, black coffee, apple or grape juice, broth.  Take these medicines the morning of surgery with A SIP OF WATER: Pacerone, COREG, DIGOXIN, LEVOTHYROXINE, PROTONIX                                             May use nitroglycerin if needed   Do not wear jewelry, make-up or nail polish.  Do not wear lotions, powders, or perfumes. You may wear deodorant.  Do not shave 48 hours prior to surgery.  Do not bring valuables to the hospital.  Contacts, dentures or bridgework may not be worn into surgery.  Leave suitcase in the car. After surgery it may be brought to your room.  For patients admitted to the hospital, checkout time is 11:00 AM the day of discharge.   Patients discharged the day of surgery will not be allowed to drive home.  Name and phone number of your driver:    wife                                                                  Special Instructions: CHG Shower Use Special Wash:   Instructions   reviewed  REGULAR SOAP FACE AND PRIVATES                        MEN-MAY SHAVE FACE MORNING OF SURGERY  Please read over the following fact sheets that you were given: MRSA Information

## 2012-07-02 NOTE — Pre-Procedure Instructions (Signed)
Faxed STOP BANG SCREEN to Dr Virgina Jock with [facility]

## 2012-07-02 NOTE — Pre-Procedure Instructions (Signed)
Left voicemail message with [facility], urology, for possible cardiac clearance report if one is available

## 2012-07-02 NOTE — Progress Notes (Signed)
07/02/12 1128  OBSTRUCTIVE SLEEP APNEA  Have you ever been diagnosed with sleep apnea through a sleep study? No  Do you snore loudly (loud enough to be heard through closed doors)?  0  Do you often feel tired, fatigued, or sleepy during the daytime? 1  Has anyone observed you stop breathing during your sleep? 0  Do you have, or are you being treated for high blood pressure? 1  BMI more than 35 kg/m2? 0  Age over 76 years old? 1  Neck circumference greater than 40 cm/18 inches? 0  Gender: 1  Obstructive Sleep Apnea Score 4   Score 4 or greater  Updated health history;Results sent to PCP

## 2012-07-02 NOTE — Pre-Procedure Instructions (Signed)
Faxed CBC, BMET to Dr [name] for review with confirmation

## 2012-07-08 ENCOUNTER — Encounter (HOSPITAL_COMMUNITY): Payer: Self-pay | Admitting: *Deleted

## 2012-07-08 ENCOUNTER — Encounter (HOSPITAL_COMMUNITY): Payer: Self-pay | Admitting: Anesthesiology

## 2012-07-08 ENCOUNTER — Ambulatory Visit (HOSPITAL_COMMUNITY): Payer: Medicare Other | Admitting: Anesthesiology

## 2012-07-08 ENCOUNTER — Ambulatory Visit (HOSPITAL_COMMUNITY)
Admission: RE | Admit: 2012-07-08 | Discharge: 2012-07-09 | Disposition: A | Discharge status: Home / Self Care | Payer: Medicare Other | Source: Ambulatory Visit | Attending: Urology | Admitting: Urology

## 2012-07-08 ENCOUNTER — Encounter (HOSPITAL_COMMUNITY)
Admission: RE | Disposition: A | Discharge status: Home / Self Care | Payer: Self-pay | Source: Ambulatory Visit | Attending: Urology

## 2012-07-08 DIAGNOSIS — E785 Hyperlipidemia, unspecified: Secondary | ICD-10-CM | POA: Insufficient documentation

## 2012-07-08 DIAGNOSIS — N3289 Other specified disorders of bladder: Secondary | ICD-10-CM | POA: Insufficient documentation

## 2012-07-08 DIAGNOSIS — K219 Gastro-esophageal reflux disease without esophagitis: Secondary | ICD-10-CM | POA: Insufficient documentation

## 2012-07-08 DIAGNOSIS — N2889 Other specified disorders of kidney and ureter: Secondary | ICD-10-CM | POA: Insufficient documentation

## 2012-07-08 DIAGNOSIS — C679 Malignant neoplasm of bladder, unspecified: Secondary | ICD-10-CM | POA: Insufficient documentation

## 2012-07-08 DIAGNOSIS — Z01812 Encounter for preprocedural laboratory examination: Secondary | ICD-10-CM | POA: Insufficient documentation

## 2012-07-08 DIAGNOSIS — R310 Gross hematuria: Secondary | ICD-10-CM | POA: Diagnosis not present

## 2012-07-08 DIAGNOSIS — I2510 Atherosclerotic heart disease of native coronary artery without angina pectoris: Secondary | ICD-10-CM | POA: Diagnosis not present

## 2012-07-08 DIAGNOSIS — N189 Chronic kidney disease, unspecified: Secondary | ICD-10-CM | POA: Insufficient documentation

## 2012-07-08 DIAGNOSIS — G4730 Sleep apnea, unspecified: Secondary | ICD-10-CM | POA: Insufficient documentation

## 2012-07-08 DIAGNOSIS — Z79899 Other long term (current) drug therapy: Secondary | ICD-10-CM | POA: Insufficient documentation

## 2012-07-08 DIAGNOSIS — I129 Hypertensive chronic kidney disease with stage 1 through stage 4 chronic kidney disease, or unspecified chronic kidney disease: Secondary | ICD-10-CM | POA: Insufficient documentation

## 2012-07-08 DIAGNOSIS — E039 Hypothyroidism, unspecified: Secondary | ICD-10-CM | POA: Insufficient documentation

## 2012-07-08 DIAGNOSIS — I4891 Unspecified atrial fibrillation: Secondary | ICD-10-CM | POA: Diagnosis not present

## 2012-07-08 DIAGNOSIS — Z95810 Presence of automatic (implantable) cardiac defibrillator: Secondary | ICD-10-CM | POA: Insufficient documentation

## 2012-07-08 HISTORY — PX: TRANSURETHRAL RESECTION OF BLADDER TUMOR: SHX2575

## 2012-07-08 HISTORY — PX: CYSTOSCOPY W/ URETERAL STENT PLACEMENT: SHX1429

## 2012-07-08 HISTORY — PX: CYSTOSCOPY W/ URETERAL STENT REMOVAL: SHX1430

## 2012-07-08 HISTORY — PX: CYSTOSCOPY/RETROGRADE/URETEROSCOPY: SHX5316

## 2012-07-08 SURGERY — TURBT (TRANSURETHRAL RESECTION OF BLADDER TUMOR)
Anesthesia: General | Wound class: Clean Contaminated

## 2012-07-08 MED ORDER — ACETAMINOPHEN 10 MG/ML IV SOLN
INTRAVENOUS | Status: DC | PRN
  Administered 2012-07-08: 1000 mg via INTRAVENOUS

## 2012-07-08 MED ORDER — NEOSTIGMINE METHYLSULFATE 1 MG/ML IJ SOLN
INTRAMUSCULAR | Status: DC | PRN
  Administered 2012-07-08: 3 mg via INTRAVENOUS

## 2012-07-08 MED ORDER — ATORVASTATIN CALCIUM 40 MG PO TABS
40.0000 mg | ORAL_TABLET | Freq: Every day | ORAL | Status: DC
  Administered 2012-07-08: 40 mg via ORAL
  Filled 2012-07-08 (×2): qty 1

## 2012-07-08 MED ORDER — CIPROFLOXACIN IN D5W 400 MG/200ML IV SOLN
400.0000 mg | INTRAVENOUS | Status: AC
  Administered 2012-07-08: 400 mg via INTRAVENOUS

## 2012-07-08 MED ORDER — PANTOPRAZOLE SODIUM 40 MG PO TBEC
40.0000 mg | DELAYED_RELEASE_TABLET | Freq: Every evening | ORAL | Status: DC
  Administered 2012-07-08: 40 mg via ORAL
  Filled 2012-07-08 (×2): qty 1

## 2012-07-08 MED ORDER — AMIODARONE HCL 100 MG PO TABS
100.0000 mg | ORAL_TABLET | Freq: Every day | ORAL | Status: DC
  Administered 2012-07-09: 100 mg via ORAL
  Filled 2012-07-08: qty 1

## 2012-07-08 MED ORDER — BACITRACIN-NEOMYCIN-POLYMYXIN 400-5-5000 EX OINT
1.0000 "application " | TOPICAL_OINTMENT | Freq: Four times a day (QID) | CUTANEOUS | Status: DC
  Administered 2012-07-08 (×2): 1 via TOPICAL

## 2012-07-08 MED ORDER — OXYCODONE HCL 5 MG PO TABS
5.0000 mg | ORAL_TABLET | ORAL | Status: DC | PRN
  Administered 2012-07-08: 5 mg via ORAL
  Filled 2012-07-08: qty 1

## 2012-07-08 MED ORDER — MIDAZOLAM HCL 5 MG/5ML IJ SOLN
INTRAMUSCULAR | Status: DC | PRN
  Administered 2012-07-08: .5 mg via INTRAVENOUS

## 2012-07-08 MED ORDER — IRBESARTAN 75 MG PO TABS
75.0000 mg | ORAL_TABLET | Freq: Every day | ORAL | Status: DC
  Administered 2012-07-08 – 2012-07-09 (×2): 75 mg via ORAL
  Filled 2012-07-08 (×2): qty 1

## 2012-07-08 MED ORDER — PROPOFOL 10 MG/ML IV BOLUS
INTRAVENOUS | Status: DC | PRN
  Administered 2012-07-08: 170 mg via INTRAVENOUS

## 2012-07-08 MED ORDER — HETASTARCH-ELECTROLYTES 6 % IV SOLN
INTRAVENOUS | Status: DC | PRN
  Administered 2012-07-08: 16:00:00 via INTRAVENOUS

## 2012-07-08 MED ORDER — LACTATED RINGERS IV SOLN
INTRAVENOUS | Status: DC
  Administered 2012-07-08: 1000 mL via INTRAVENOUS

## 2012-07-08 MED ORDER — BACITRACIN-NEOMYCIN-POLYMYXIN 400-5-5000 EX OINT: 1.0000 "application " | TOPICAL_OINTMENT | Freq: Three times a day (TID) | CUTANEOUS | Status: DC | PRN

## 2012-07-08 MED ORDER — PROMETHAZINE HCL 25 MG/ML IJ SOLN: 6.2500 mg | INTRAMUSCULAR | Status: DC | PRN

## 2012-07-08 MED ORDER — ACETAMINOPHEN 325 MG PO TABS: 650.0000 mg | ORAL_TABLET | ORAL | Status: DC | PRN

## 2012-07-08 MED ORDER — POTASSIUM CHLORIDE ER 10 MEQ PO TBCR
10.0000 meq | EXTENDED_RELEASE_TABLET | Freq: Every day | ORAL | Status: DC
  Administered 2012-07-09: 10 meq via ORAL
  Filled 2012-07-08: qty 1

## 2012-07-08 MED ORDER — SODIUM CHLORIDE 0.9 % IV SOLN
INTRAVENOUS | Status: DC
  Administered 2012-07-08: 17:00:00 via INTRAVENOUS

## 2012-07-08 MED ORDER — EZETIMIBE 10 MG PO TABS
10.0000 mg | ORAL_TABLET | Freq: Every day | ORAL | Status: DC
  Administered 2012-07-08: 10 mg via ORAL
  Filled 2012-07-08 (×2): qty 1

## 2012-07-08 MED ORDER — DIATRIZOATE MEGLUMINE 30 % UR SOLN
URETHRAL | Status: DC | PRN
  Administered 2012-07-08: 300 mL via URETHRAL

## 2012-07-08 MED ORDER — FUROSEMIDE 20 MG PO TABS
20.0000 mg | ORAL_TABLET | Freq: Every day | ORAL | Status: DC
  Administered 2012-07-09: 20 mg via ORAL
  Filled 2012-07-08: qty 1

## 2012-07-08 MED ORDER — FENTANYL CITRATE 0.05 MG/ML IJ SOLN
INTRAMUSCULAR | Status: DC | PRN
  Administered 2012-07-08: 100 ug via INTRAVENOUS
  Administered 2012-07-08: 25 ug via INTRAVENOUS
  Administered 2012-07-08: 75 ug via INTRAVENOUS

## 2012-07-08 MED ORDER — ONDANSETRON HCL 4 MG/2ML IJ SOLN
INTRAMUSCULAR | Status: DC | PRN
  Administered 2012-07-08: 4 mg via INTRAVENOUS

## 2012-07-08 MED ORDER — ACETAMINOPHEN 10 MG/ML IV SOLN
INTRAVENOUS | Status: AC
  Filled 2012-07-08: qty 100

## 2012-07-08 MED ORDER — NITROGLYCERIN 0.4 MG SL SUBL: 0.4000 mg | SUBLINGUAL_TABLET | SUBLINGUAL | Status: DC | PRN

## 2012-07-08 MED ORDER — ACETAMINOPHEN 10 MG/ML IV SOLN
1000.0000 mg | Freq: Four times a day (QID) | INTRAVENOUS | Status: DC
  Administered 2012-07-08 – 2012-07-09 (×2): 1000 mg via INTRAVENOUS
  Filled 2012-07-08 (×4): qty 100

## 2012-07-08 MED ORDER — LACTATED RINGERS IV SOLN: INTRAVENOUS | Status: DC

## 2012-07-08 MED ORDER — ONDANSETRON HCL 4 MG/2ML IJ SOLN: 4.0000 mg | INTRAMUSCULAR | Status: DC | PRN

## 2012-07-08 MED ORDER — BELLADONNA ALKALOIDS-OPIUM 16.2-60 MG RE SUPP: 1.0000 | Freq: Four times a day (QID) | RECTAL | Status: DC | PRN

## 2012-07-08 MED ORDER — MORPHINE SULFATE 2 MG/ML IJ SOLN: 2.0000 mg | INTRAMUSCULAR | Status: DC | PRN

## 2012-07-08 MED ORDER — CARVEDILOL 6.25 MG PO TABS
6.2500 mg | ORAL_TABLET | Freq: Two times a day (BID) | ORAL | Status: DC
  Administered 2012-07-09: 6.25 mg via ORAL
  Filled 2012-07-08 (×4): qty 1

## 2012-07-08 MED ORDER — SODIUM CHLORIDE 0.9 % IV SOLN
INTRAVENOUS | Status: DC
  Administered 2012-07-09: 02:00:00 via INTRAVENOUS

## 2012-07-08 MED ORDER — SODIUM CHLORIDE 0.9 % IV SOLN
INTRAVENOUS | Status: DC | PRN
  Administered 2012-07-08: 17:00:00 via INTRAVENOUS

## 2012-07-08 MED ORDER — FENTANYL CITRATE 0.05 MG/ML IJ SOLN: 25.0000 ug | INTRAMUSCULAR | Status: DC | PRN

## 2012-07-08 MED ORDER — PHENYLEPHRINE HCL 10 MG/ML IJ SOLN
INTRAMUSCULAR | Status: DC | PRN
  Administered 2012-07-08 (×3): 80 ug via INTRAVENOUS

## 2012-07-08 MED ORDER — BELLADONNA ALKALOIDS-OPIUM 16.2-60 MG RE SUPP
RECTAL | Status: AC
  Filled 2012-07-08: qty 1

## 2012-07-08 MED ORDER — BELLADONNA ALKALOIDS-OPIUM 16.2-60 MG RE SUPP
RECTAL | Status: DC | PRN
  Administered 2012-07-08: 1 via RECTAL

## 2012-07-08 MED ORDER — CIPROFLOXACIN IN D5W 400 MG/200ML IV SOLN
INTRAVENOUS | Status: AC
  Filled 2012-07-08: qty 200

## 2012-07-08 MED ORDER — CISATRACURIUM BESYLATE (PF) 10 MG/5ML IV SOLN
INTRAVENOUS | Status: DC | PRN
  Administered 2012-07-08: 6 mg via INTRAVENOUS
  Administered 2012-07-08: 1 mg via INTRAVENOUS

## 2012-07-08 MED ORDER — DIGOXIN 125 MCG PO TABS
0.1250 mg | ORAL_TABLET | Freq: Every day | ORAL | Status: DC
  Administered 2012-07-09: 0.125 mg via ORAL
  Filled 2012-07-08: qty 1

## 2012-07-08 MED ORDER — LIDOCAINE HCL (CARDIAC) 20 MG/ML IV SOLN
INTRAVENOUS | Status: DC | PRN
  Administered 2012-07-08: 60 mg via INTRAVENOUS

## 2012-07-08 MED ORDER — IOHEXOL 300 MG/ML  SOLN
INTRAMUSCULAR | Status: AC
  Filled 2012-07-08: qty 1

## 2012-07-08 MED ORDER — MEPERIDINE HCL 50 MG/ML IJ SOLN: 6.2500 mg | INTRAMUSCULAR | Status: DC | PRN

## 2012-07-08 MED ORDER — IOHEXOL 300 MG/ML  SOLN
INTRAMUSCULAR | Status: DC | PRN
  Administered 2012-07-08: 10 mL

## 2012-07-08 MED ORDER — SODIUM CHLORIDE 0.9 % IR SOLN
Status: DC | PRN
  Administered 2012-07-08: 9000 mL

## 2012-07-08 MED ORDER — LEVOTHYROXINE SODIUM 50 MCG PO TABS
50.0000 ug | ORAL_TABLET | Freq: Every day | ORAL | Status: DC
  Administered 2012-07-09: 50 ug via ORAL
  Filled 2012-07-08 (×3): qty 1

## 2012-07-08 MED ORDER — LIDOCAINE HCL 2 % EX GEL
CUTANEOUS | Status: AC
  Filled 2012-07-08: qty 10

## 2012-07-08 MED ORDER — SENNOSIDES-DOCUSATE SODIUM 8.6-50 MG PO TABS
1.0000 | ORAL_TABLET | Freq: Two times a day (BID) | ORAL | Status: DC
  Administered 2012-07-08 – 2012-07-09 (×2): 1 via ORAL
  Filled 2012-07-08 (×3): qty 1

## 2012-07-08 MED ORDER — 0.9 % SODIUM CHLORIDE (POUR BTL) OPTIME
TOPICAL | Status: DC | PRN
  Administered 2012-07-08: 1000 mL

## 2012-07-08 MED ORDER — GLYCOPYRROLATE 0.2 MG/ML IJ SOLN
INTRAMUSCULAR | Status: DC | PRN
  Administered 2012-07-08: 0.6 mg via INTRAVENOUS

## 2012-07-08 MED ORDER — CIPROFLOXACIN HCL 250 MG PO TABS
250.0000 mg | ORAL_TABLET | Freq: Two times a day (BID) | ORAL | Status: DC
  Administered 2012-07-08 – 2012-07-09 (×2): 250 mg via ORAL
  Filled 2012-07-08 (×6): qty 1

## 2012-07-08 SURGICAL SUPPLY — 33 items
ADAPTER CATH URET PLST 4-6FR (CATHETERS) ×4 IMPLANT
BAG URINE DRAINAGE (UROLOGICAL SUPPLIES) ×4 IMPLANT
BAG URO CATCHER STRL LF (DRAPE) ×4 IMPLANT
BASKET ZERO TIP NITINOL 2.4FR (BASKET) IMPLANT
CATH COUDE 22FR 5CC (CATHETERS) ×3 IMPLANT
CATH INTERMIT  6FR 70CM (CATHETERS) IMPLANT
CATH RIBBED COUDE 30CC (CATHETERS) ×1
CATH TIEMANN FOLEY 18FR 5CC (CATHETERS) ×4 IMPLANT
CATH URET 5FR 28IN OPEN ENDED (CATHETERS) ×4 IMPLANT
CATH URET DUAL LUMEN 6-10FR 50 (CATHETERS) ×4 IMPLANT
CLOTH BEACON ORANGE TIMEOUT ST (SAFETY) ×4 IMPLANT
DRAPE CAMERA CLOSED 9X96 (DRAPES) ×4 IMPLANT
ELECT LOOP MED HF 24F 12D CBL (CLIP) ×4 IMPLANT
ELECT REM PT RETURN 9FT ADLT (ELECTROSURGICAL)
ELECTRODE REM PT RTRN 9FT ADLT (ELECTROSURGICAL) IMPLANT
EVACUATOR MICROVAS BLADDER (UROLOGICAL SUPPLIES) IMPLANT
GLOVE BIOGEL M 7.0 STRL (GLOVE) ×4 IMPLANT
GLOVE BIOGEL M STRL SZ7.5 (GLOVE) ×4 IMPLANT
GLOVE BIOGEL PI IND STRL 7.5 (GLOVE) IMPLANT
GLOVE BIOGEL PI INDICATOR 7.5 (GLOVE)
GLOVE ECLIPSE 7.0 STRL STRAW (GLOVE) ×4 IMPLANT
GOWN PREVENTION PLUS XLARGE (GOWN DISPOSABLE) ×4 IMPLANT
GOWN STRL NON-REIN LRG LVL3 (GOWN DISPOSABLE) ×8 IMPLANT
GUIDEWIRE ANG ZIPWIRE 038X150 (WIRE) IMPLANT
GUIDEWIRE STR DUAL SENSOR (WIRE) ×8 IMPLANT
KIT ASPIRATION TUBING (SET/KITS/TRAYS/PACK) IMPLANT
LASER FIBER DISP (UROLOGICAL SUPPLIES) IMPLANT
LOOPS RESECTOSCOPE DISP (ELECTROSURGICAL) IMPLANT
MANIFOLD NEPTUNE II (INSTRUMENTS) ×4 IMPLANT
PACK CYSTO (CUSTOM PROCEDURE TRAY) ×4 IMPLANT
STENT CONTOUR 6FRX26X.038 (STENTS) ×4 IMPLANT
SYRINGE IRR TOOMEY STRL 70CC (SYRINGE) ×4 IMPLANT
TUBING CONNECTING 10 (TUBING) ×4 IMPLANT

## 2012-07-08 NOTE — Anesthesia Postprocedure Evaluation (Signed)
  Anesthesia Post-op Note  Patient: [name]  Procedure(s) Performed: Procedure(s) (LRB): TRANSURETHRAL RESECTION OF BLADDER TUMOR (TURBT) (N/A) CYSTOSCOPY WITH STENT REMOVAL (Bilateral) CYSTOSCOPY WITH STENT REPLACEMENT (Left) CYSTOSCOPY/RETROGRADE/URETEROSCOPY (Left)  Patient Location: PACU  Anesthesia Type: General  Level of Consciousness: awake and alert   Airway and Oxygen Therapy: Patient Spontanous Breathing  Post-op Pain: mild  Post-op Assessment: Post-op Vital signs reviewed, Patient's Cardiovascular Status Stable, Respiratory Function Stable, Patent Airway and No signs of Nausea or vomiting  Post-op Vital Signs: stable  Complications: No apparent anesthesia complications

## 2012-07-08 NOTE — Transfer of Care (Signed)
Immediate Anesthesia Transfer of Care Note  Patient: [name]  Procedure(s) Performed: Procedure(s) (LRB) with comments: TRANSURETHRAL RESECTION OF BLADDER TUMOR (TURBT) (N/A) - CYSTO, TURBT W/ GYRUS, BILATERAL STENT REMOVAL, LEFT URETEROSCOPY WITH BIOPSY, POSSIBLE LEFT STENT PLACEMENT   CYSTOSCOPY WITH STENT REMOVAL (Bilateral) CYSTOSCOPY WITH STENT REPLACEMENT (Left) CYSTOSCOPY/RETROGRADE/URETEROSCOPY (Left)  Patient Location: PACU  Anesthesia Type: General  Level of Consciousness: sedated  Airway & Oxygen Therapy: Patient Spontanous Breathing and Patient connected to face mask oxygen  Post-op Assessment: Report given to PACU RN, Post -op Vital signs reviewed and stable and Patient moving all extremities X 4  Post vital signs: stable  Complications: No apparent anesthesia complications

## 2012-07-08 NOTE — Anesthesia Preprocedure Evaluation (Signed)
Anesthesia Evaluation  Patient identified by MRN, date of birth, ID band Patient awake    Reviewed: Allergy & Precautions, H&P , NPO status , Patient's Chart, lab work & pertinent test results, reviewed documented beta blocker date and time   Airway Mallampati: II TM Distance: >3 FB Neck ROM: Full    Dental  (+) Missing and Dental Advisory Given,    Pulmonary  breath sounds clear to auscultation  Pulmonary exam normal       Cardiovascular Exercise Tolerance: Poor hypertension, Pt. on medications and Pt. on home beta blockers + CAD, + Past MI, + Cardiac Stents and +CHF + dysrhythmias Atrial Fibrillation + pacemaker + Cardiac Defibrillator Rhythm:Regular Rate:Normal     Neuro/Psych negative psych ROS   GI/Hepatic GERD-  Medicated,  Endo/Other  Hypothyroidism   Renal/GU Renal Insufficiency and CRFRenal disease     Musculoskeletal   Abdominal (+) - obese,  Abdomen: soft.    Peds  Hematology negative hematology ROS (+)   Anesthesia Other Findings   Reproductive/Obstetrics                           Anesthesia Physical  Anesthesia Plan  ASA: IV  Anesthesia Plan: General   Post-op Pain Management:    Induction: Intravenous  Airway Management Planned: Oral ETT  Additional Equipment:   Intra-op Plan:   Post-operative Plan: Extubation in OR  Informed Consent: I have reviewed the patients History and Physical, chart, labs and discussed the procedure including the risks, benefits and alternatives for the proposed anesthesia with the patient or authorized representative who has indicated his/her understanding and acceptance.   Dental advisory given  Plan Discussed with: CRNA and Surgeon  Anesthesia Plan Comments:         Anesthesia Quick Evaluation

## 2012-07-08 NOTE — Brief Op Note (Signed)
07/08/2012  4:39 PM  PATIENT:  [name]  78 y.o. male  PRE-OPERATIVE DIAGNOSIS:  BLADDER CANCER  POST-OPERATIVE DIAGNOSIS:  BLADDER CANCER  PROCEDURE:  Procedure(s) (LRB) with comments: TRANSURETHRAL RESECTION OF BLADDER TUMOR (TURBT) (N/A) - CYSTO, TURBT W/ GYRUS, BILATERAL STENT REMOVAL, LEFT URETEROSCOPY WITH BIOPSY, POSSIBLE LEFT STENT PLACEMENT   CYSTOSCOPY WITH STENT REMOVAL (Bilateral) CYSTOSCOPY WITH STENT REPLACEMENT (Left) CYSTOSCOPY/RETROGRADE/URETEROSCOPY (Left) Cystogram  SURGEON:  Surgeon(s) and Role:    * Molli Hazard, MD - Primary  PHYSICIAN ASSISTANT:   ASSISTANTS: none   ANESTHESIA:   general  EBL: Minimal  Total I/O In: 1000 [I.V.:1000] Out: -   BLOOD ADMINISTERED:none  DRAINS: Urinary Catheter (Foley)   LOCAL MEDICATIONS USED:  OTHER B&O suppository.   SPECIMEN:  Source of Specimen:  Bladder and left distal ureter.  DISPOSITION OF SPECIMEN:  PATHOLOGY  COUNTS:  YES  TOURNIQUET:  * No tourniquets in log *  DICTATION: .Other Dictation: Dictation Number [id number]  PLAN OF CARE: Admit for overnight observation  PATIENT DISPOSITION:  PACU - hemodynamically stable.   Delay start of Pharmacological VTE agent (>24hrs) due to surgical blood loss or risk of bleeding: yes

## 2012-07-08 NOTE — H&P (Signed)
Urology History and Physical Exam  CC: Bladder cancer.  HPI: 78 year old male presents for repeat TURBT for bladder cancer. He was found to have a bladder tumor and had TURBT which revealed high grade non-muscle invasive bladder cancer. This was diagnosed August 2013. I have recommended a repeat resection to ensure that he is accurately staged. We have reviewed the risks, benefits, side effects, alternatives, and likelihood of achieving goals. Urine culture from 06/25/12 was negative for growth. Patient was cleared by Dr. Haroldine Laws to be off plavix and aspirin for the surgery. Lab from Dr. Virgina Jock 06/12/12 show values to be at baseline: Cr 1.9 GFR 34 BUN 34   PMH: Past Medical History  Diagnosis Date  . CAD (coronary artery disease)      a. s/p anterior MI 12/05 c/b shock -> stent LAD   b. s/p stenting OM-1, 2/06  . CHF (congestive heart failure)     due to ischemic CM  a. EF 20-30%. (Nov 2008)   b. s/p St. Jude BiV-ICD    c. CPX 07/2008  pvo2 16.3 (63% predicted) slope 34 RER 1.08 O2 pulse 93%  . Hyperlipidemia   . Hypothyroidism   . GERD (gastroesophageal reflux disease)     hx Barretts esophagitis  . Anemia   . 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 Myocardial infarction 09/15/2004  . History of blood transfusion     following coumadin  usage  . Cough     occasional /productive, no fever/ not new  . ICD (implantable cardiac defibrillator) in place     BiV/ICD  . Skin cancer     basal cells   facial x 4, 1 right forearm  . Bladder cancer dx'd 05/2012  . HTN (hypertension)     EKG 05/14/12,Chest x ray 8/13, last ICD interrogation 8/13 EPIC  . Atrial fibrillation or flutter     maintaining sinus on amiodarone    . Sleep apnea     STOP BANG SCORE 4  . Pacemaker   . CRI (chronic renal insufficiency)     (baseline 2.0-2.2)/ recent hospitalization 8/13    PSH: Past Surgical History  Procedure Date  . Cervical laminectomy 1985  . Back  surgery 1972    lower back  . Cardiac defibrillator placement     ICD St Jude  . Hernia repair 1989    bilateral  . Esophagogastroduodenoscopy   . Colonoscopy   . Coronary angioplasty [id number]  . Transurethral resection of bladder tumor 05/25/2012    cold cup biopsy prostate  . Cystoscopy w/ retrogrades 05/25/2012    Procedure: CYSTOSCOPY WITH RETROGRADE PYELOGRAM;  Surgeon: Molli Hazard, MD;  Location: [facility];  Service: Urology;  Laterality: Bilateral;    Allergies: Allergies  Allergen Reactions  . Ramipril Other (See Comments)    cough  . Amoxicillin Rash  . Penicillins Rash    Medications: No prescriptions prior to admission     Social History: History   Social History  . Marital Status: Married    Spouse Name: N/A    Number of Children: N/A  . Years of Education: N/A   Occupational History  . retired    Social History Main Topics  . Smoking status: Former Smoker    Types: Cigarettes, Cigars    Quit date: 03/02/2004  . Smokeless tobacco: Former Systems developer    Quit date: 05/20/1999   Comment: quit in 2005  . Alcohol Use: No  .  Drug Use: No  . Sexually Active: Not on file   Other Topics Concern  . Not on file   Social History Narrative  . No narrative on file    Family History: Family History  Problem Relation Age of Onset  . Coronary artery disease    . Stroke      Review of Systems: Positive: Weak stream. Negative: Fever, chest pain, SOB.  A further 10 point review of systems was negative except what is listed in the HPI.  Physical Exam: Filed Vitals:   07/08/12 1053  BP: 105/70  Pulse: 61  Temp: 97.2 F (36.2 C)  Resp: 16    General: No acute distress.  Awake. Head:  Normocephalic.  Atraumatic. ENT:  EOMI.  Mucous membranes moist Neck:  Supple.  No lymphadenopathy. Pulmonary: Equal effort bilaterally.  Clear to auscultation bilaterally. Abdomen: Soft.  Non- tender to palpation. Skin:  Normal turgor.  No visible  rash. Extremity: No gross deformity of bilateral upper extremities.  No gross deformity of    bilateral lower extremities. Neurologic: Alert. Appropriate mood.   Studies:  No results found for this basename: HGB:2,WBC:2,PLT:2 in the last 72 hours  No results found for this basename: NA:2,K:2,CL:2,CO2:2,BUN:2,CREATININE:2,CALCIUM:2,MAGNESIUM:2,GFRNONAA:2,GFRAA:2 in the last 72 hours   No results found for this basename: PT:2,INR:2,APTT:2 in the last 72 hours   No components found with this basename: ABG:2    Assessment:  Bladder cancer.  Plan: To OR for cystoscopy, TURBT, bilateral ureteral stent removal, left ureteroscopy with biopsy, possible left ureter stent placement.  Admit for overnight observation due to multiple comorbidities.

## 2012-07-09 ENCOUNTER — Encounter (HOSPITAL_COMMUNITY): Payer: Self-pay | Admitting: Urology

## 2012-07-09 DIAGNOSIS — N2889 Other specified disorders of kidney and ureter: Secondary | ICD-10-CM | POA: Diagnosis not present

## 2012-07-09 DIAGNOSIS — D4100 Neoplasm of uncertain behavior of unspecified kidney: Secondary | ICD-10-CM | POA: Diagnosis not present

## 2012-07-09 DIAGNOSIS — D4120 Neoplasm of uncertain behavior of unspecified ureter: Secondary | ICD-10-CM | POA: Diagnosis not present

## 2012-07-09 LAB — BASIC METABOLIC PANEL
CO2: 26 mEq/L (ref 19–32)
Calcium: 8.2 mg/dL — ABNORMAL LOW (ref 8.4–10.5)
Creatinine, Ser: 1.6 mg/dL — ABNORMAL HIGH (ref 0.50–1.35)
Glucose, Bld: 106 mg/dL — ABNORMAL HIGH (ref 70–99)

## 2012-07-09 MED ORDER — SENNOSIDES-DOCUSATE SODIUM 8.6-50 MG PO TABS: 1.0000 | ORAL_TABLET | Freq: Two times a day (BID) | ORAL | Status: DC

## 2012-07-09 MED ORDER — PHENAZOPYRIDINE HCL 100 MG PO TABS: 200.0000 mg | ORAL_TABLET | Freq: Three times a day (TID) | ORAL | Status: DC | PRN

## 2012-07-09 MED ORDER — HYDROCODONE-ACETAMINOPHEN 5-325 MG PO TABS: 1.0000 | ORAL_TABLET | ORAL | Status: DC | PRN

## 2012-07-09 MED ORDER — OXYBUTYNIN CHLORIDE 5 MG PO TABS: 5.0000 mg | ORAL_TABLET | Freq: Four times a day (QID) | ORAL | Status: DC | PRN

## 2012-07-09 MED ORDER — CIPROFLOXACIN HCL 250 MG PO TABS: 250.0000 mg | ORAL_TABLET | Freq: Two times a day (BID) | ORAL | Status: DC

## 2012-07-09 NOTE — Op Note (Signed)
NAME:  [name], [name] NO.:  [id number]  MEDICAL RECORD NO.:  [id number]  LOCATION:  [id number]                         FACILITY:  [facility]  PHYSICIAN:  Rolan Bucco, MD    DATE OF BIRTH:  21-Apr-1932  DATE OF PROCEDURE:  07/08/2012 DATE OF DISCHARGE:                              OPERATIVE REPORT   SURGEON:  Rolan Bucco, MD  ASSISTANT:  None.  PREOPERATIVE DIAGNOSIS:  Bladder cancer.  POSTOPERATIVE DIAGNOSIS:  Bladder cancer.  PROCEDURES PERFORMED: 1. Cystoscopy. 2. Bilateral ureteral stent removal. 3. Left ureteroscopy. 4. Left ureteral biopsy. 5. Left retrograde pyelogram. 6. Left ureteral stent placement. 7. Transurethral resection of bladder tumor. 8. Cystogram.  FINDINGS:  No recurrent tumor in the bladder.  There was no concerning area of tumor in the distal left ureter, but biopsies were taken.  I did take several cold cup biopsies after performing the TURBT of deeper tissue to ensure we had muscle on specimen.  One biopsy was very deep. I did an on-table cystogram because of this specimen.  There was some fat showing, however, there was no extravasation on on-table cystogram.  PATHOLOGY: 1. Separate specimens were sent.  One specimen was a left ureter     biopsy. 2. Left ureteral orifice, which was resected with the resectoscope. 3. Transurethral resection of bladder tumor, the tumor bed  4. Deeper tissue biopsies with cold cup forceps.  COMPLICATIONS:  None.  DRAINS:  Foley catheter.  HISTORY OF PRESENT ILLNESS:  This is a 78 year old gentleman who presented today with a known history of high-grade urothelial carcinoma of the bladder.  The initial resection showed no invasion of the muscle of the bladder.  The patient presents today for re-resection to ensure that staging is accurate before proceeding with any other type of therapy.  PROCEDURE:  After informed consent was obtained, the patient was taken to the operating room where he  was placed in the supine position.  IV antibiotics were infused and general anesthesia was induced.  SCDs were in place and turned on prior to induction of anesthesia.  He was placed in the dorsal lithotomy position making sure to pad all pertinent neurovascular pressure points appropriately.  His genitals were prepped and draped in usual sterile fashion.  Time-out was performed, which the correct patient, surgical site, procedure were identified and agreed upon by the team.  Rigid cystoscope was advanced through the urethra and to the bladder. The bladder was distended fully and evaluated in a systematic fashion with the 30-degree and 70-degree lens.  After this, a stent grasper was used to grasp the right ureteral stent, and this was removed in its entirety.  Next, the left ureter stent was grasped and brought to the urethral meatus.  A Sensor wire was placed up over this with ease.  Due to the acute angle of the ureteral orifice, I had to use a dual-lumen access sheath and placed this over one Sensor tip wire to place a second Sensor wire into the left renal pelvis with a good curl on fluoroscopy into the left renal pelvis.  After this was done, semi-rigid ureteroscope was placed into the left distal ureter.  I was able to navigate this  into the midureter with no tumor noted in the ureter.  In the distal ureter, I did use a Piranha forceps to perform a biopsy in the distal ureter due to the patient's high-grade cancer.  These were sent for separate pathology.  After this was done, both wires were removed. Then, the visual obturator to the resectoscope was placed.  First, I carried out resection over the ureteral orifice and sent this as a separate pathology specimen.  Next, transurethral resection of the tumor bed was carried out and these were sent for separate specimen.  Finally, deep biopsies were taken with cold cup biopsy forceps and these were sent for separate specimen.   Hemostasis was maintained with the loop electrocautery.  I was careful about electrocautery around the ureteral orifice to prevent any stenosis.  Next, a 5-French ureteral catheter was placed through the cystoscope and the Sensor tip wire was placed into the left distal ureter up into the midureter.  The ureter catheter was placed up to the midureter and then the Sensor tip wire was removed.  I injected a contrast through this to perform retrograde pyelogram to ensure that we were in the correct position and this confirmed that we were in the ureter.  A Sensor tip wire was placed back into the left renal pelvis on fluoroscopy.  The ureteral catheter was removed and then a 6 x 26 double-J ureteral stent without strings in place was placed up with ease.  There was a good curl noted in the left renal pelvis on fluoroscopy and a good curl in the bladder on direct visualization. Next, because of the deep nature of the biopsies, I felt that there was a small amount of fat at one of the biopsy sites that was on the anterior bladder floor.  I placed a 22-French coude-tip catheter with 10 mL of sterile water in the balloon.  I then took an x-ray of the precontrast film and then I filled the bladder to gravity drainage with approximately 370 mL of Cystografin.  The patient's bladder stopped taking more contrast at this point.  Another spot film was taken noted to have bladder diverticuli, but no extravasation.  The catheter was then drained and then another spot film was taken.  There was no extravasation of contrast.  There was only some contrast around the balloon of the catheter.  The catheter was left in place.  Belladonna and opium suppository was placed into his rectum.  The rectal examination revealed no nodules of the prostate.  Anesthesia was reversed.  He has placed back in supine position and he was taken to the PACU in stable condition.  He will be admitted overnight for  observation.          ______________________________ Rolan Bucco, MD     DW/MEDQ  D:  07/08/2012  T:  07/09/2012  Job:  [id number]

## 2012-07-09 NOTE — Discharge Summary (Signed)
Physician Discharge Summary  Patient ID: [name] MRN: [id number] DOB/AGE: [birth year] 78 y.o.  Admit date: 07/08/2012 Discharge date: 07/09/2012  Admission Diagnoses: Bladder cancer  Discharge Diagnoses:  Principal Problem:  *Bladder cancer   Discharged Condition: good  Hospital Course:  Patient with known bladder cancer presented for repeat transurethral resection of bladder tumor for staging purposes. His surgery went well. He was admitted for observation overnight as he had some changes in mental status following he last TURBT. He did well overnight on telemetry monitoring. He was able to ambulate with ease, tolerated his foley catheter, and tolerated a diet.  He felt he could be discharged home and I agree.  Consults: None  Significant Diagnostic Studies: None.  Treatments: IV hydration, antibiotics: Cipro and surgery: Transurethral resection of bladder tumor, bilateral ureter stent removal, left ureteroscopy with biopsy, left retrograde pyelogram, left ureter stent placement, cystogram.  Discharge Exam: Blood pressure 119/54, pulse 65, temperature 97.6 F (36.4 C), temperature source Oral, resp. rate 16, height 5\' 10"  (1.778 m), weight 82.101 kg (181 lb), SpO2 98.00%. Refer to PE from progress note on date of discharge.  Disposition: Home- self care.    Medication List     As of 07/09/2012  7:23 AM    STOP taking these medications         aspirin EC 325 MG tablet      clopidogrel 75 MG tablet   Commonly known as: PLAVIX      TAKE these medications         amiodarone 200 MG tablet   Commonly known as: PACERONE   Take 100 mg by mouth daily with breakfast.      carvedilol 6.25 MG tablet   Commonly known as: COREG   Take 6.25 mg by mouth 2 (two) times daily with a meal.      CENTRUM SILVER PO   Take 0.5 tablets by mouth 2 (two) times daily.      ciprofloxacin 250 MG tablet   Commonly known as: CIPRO   Take 1 tablet (250 mg total) by mouth 2 (two)  times daily.      [employer] IRON 325 (65 FE) MG tablet   Generic drug: ferrous sulfate   Take 325 mg by mouth 3 (three) times a week. 3 times a week      digoxin 0.125 MG tablet   Commonly known as: LANOXIN   Take 0.125 mg by mouth daily with breakfast. 1/2 tablet- .125 mg  tablets      ezetimibe 10 MG tablet   Commonly known as: ZETIA   Take 10 mg by mouth daily with supper.      furosemide 20 MG tablet   Commonly known as: LASIX   Take 20 mg by mouth daily with breakfast.      HYDROcodone-acetaminophen 5-325 MG per tablet   Commonly known as: NORCO/VICODIN   Take 1-2 tablets by mouth every 4 (four) hours as needed for pain.      levothyroxine 50 MCG tablet   Commonly known as: SYNTHROID, LEVOTHROID   Take 50 mcg by mouth daily before breakfast.      neomycin-bacitracin-polymyxin [phone] ointment   Apply 1 application topically 4 (four) times daily. Apply to cath insertion      nitroGLYCERIN 0.4 MG SL tablet   Commonly known as: NITROSTAT   Place 0.4 mg under the tongue every 5 (five) minutes as needed. For chest pain      oxybutynin 5 MG tablet  Commonly known as: DITROPAN   Take 1 tablet (5 mg total) by mouth every 6 (six) hours as needed.      pantoprazole 40 MG tablet   Commonly known as: PROTONIX   Take 1 tablet by mouth every evening.      phenazopyridine 100 MG tablet   Commonly known as: PYRIDIUM   Take 2 tablets (200 mg total) by mouth every 8 (eight) hours as needed for pain (Burning urination.  Will turn urine and body fluids orange.).      potassium chloride 10 MEQ tablet   Commonly known as: K-DUR   Take 10 mEq by mouth daily with breakfast.      rosuvastatin 20 MG tablet   Commonly known as: CRESTOR   Take 20 mg by mouth daily with supper.      senna-docusate 8.6-50 MG per tablet   Commonly known as: Senokot-S   Take 1 tablet by mouth 2 (two) times daily.      valsartan 80 MG tablet   Commonly known as: DIOVAN   Take 40 mg by mouth 2 (two) times  daily.           Follow-up Information    Follow up with Molli Hazard, MD. On 07/17/2012. (Cystogram at 3:00, doctor at 3:30 pm)    Contact information:   [place] [address] [phone]          Signed: Molli Hazard 07/09/2012, 7:23 AM

## 2012-07-09 NOTE — Progress Notes (Signed)
Urology Progress Note  Subjective:     No acute urologic events overnight. No bladder spasms. Ambulating with ease. Tolerating diet. No nausea. Wants to go home.   ROS: Positive: dyspepsia Negative: cardiac type chest pain  Objective:  Patient Vitals for the past 24 hrs:  BP Temp Temp src Pulse Resp SpO2 Height Weight  07/09/12 0622 119/54 mmHg 97.6 F (36.4 C) Oral 65  16  98 % - -  07/09/12 0144 107/52 mmHg 97.6 F (36.4 C) Oral 60  16  95 % - -  07/08/12 2016 - - - - - 93 % - -  07/08/12 2004 112/51 mmHg 97.7 F (36.5 C) Oral 66  14  99 % - -  07/08/12 1813 - - - - - - 5\' 10"  (1.778 m) 82.101 kg (181 lb)  07/08/12 1809 - - - - - - 5\' 10"  (1.778 m) 82.101 kg (181 lb)  07/08/12 1744 109/50 mmHg 97.8 F (36.6 C) - 66  14  99 % - -  07/08/12 1730 109/47 mmHg 97.8 F (36.6 C) - 60  17  99 % - -  07/08/12 1715 103/48 mmHg - - 60  15  96 % - -  07/08/12 1700 103/66 mmHg - - 60  15  100 % - -  07/08/12 1645 110/49 mmHg 97.4 F (36.3 C) - 60  18  100 % - -  07/08/12 1053 105/70 mmHg 97.2 F (36.2 C) Oral 61  16  98 % - -    Physical Exam: General:  No acute distress, awake Cardiovascular:    [x]   S1/S2 present, RRR  []   Irregularly irregular Chest:  CTA-B Abdomen:               []  Soft, appropriately TTP  [x]  Soft, NTTP  []  Soft, appropriately TTP, incision(s) clean/dry/intact  Genitourinary: Foley in place. Negative genital edema. Foley:  Draining pink tinged urine.    I/O last 3 completed shifts: In: 2090 [P.O.:240; I.V.:1150; IV Piggyback:700] Out: [id number] [Urine:1365]  No results found for this basename: HGB:2,WBC:2,PLT:2 in the last 72 hours  Recent Labs  [facility] 07/09/12 0502   NA 137   K 3.8   CL 105   CO2 26   BUN 20   CREATININE 1.60*   CALCIUM 8.2*   GFRNONAA 40*   GFRAA 46*     No results found for this basename: PT:2,INR:2,APTT:2 in the last 72 hours   No components found with this basename: ABG:2    Length of stay: 1 days.  Assessment:  Bladder cancer. POD#1 Transurethral resection of bladder tumor, right ureter stent removal, left ureteroscopy with biopsy, left ureter stent placement.   Plan: -Eat breakfast. -Discharge home. -Follow up as scheduled 07/17/12 with cystogram and discussion of pathology.   Rolan Bucco, MD [phone]

## 2012-07-17 DIAGNOSIS — C679 Malignant neoplasm of bladder, unspecified: Secondary | ICD-10-CM | POA: Diagnosis not present

## 2012-07-22 ENCOUNTER — Other Ambulatory Visit: Payer: Self-pay | Admitting: Internal Medicine

## 2012-07-23 MED ORDER — CARVEDILOL 6.25 MG PO TABS: 6.2500 mg | ORAL_TABLET | Freq: Two times a day (BID) | ORAL | Status: DC

## 2012-07-27 ENCOUNTER — Encounter (HOSPITAL_COMMUNITY): Payer: Medicare Other

## 2012-08-12 ENCOUNTER — Ambulatory Visit (HOSPITAL_COMMUNITY)
Admission: RE | Admit: 2012-08-12 | Discharge: 2012-08-12 | Disposition: A | Discharge status: Home / Self Care | Payer: Medicare Other | Source: Ambulatory Visit | Attending: Internal Medicine | Admitting: Internal Medicine

## 2012-08-12 ENCOUNTER — Other Ambulatory Visit: Payer: Self-pay

## 2012-08-12 ENCOUNTER — Encounter (HOSPITAL_COMMUNITY): Payer: Self-pay

## 2012-08-12 VITALS — BP 130/62 | HR 70 | Ht 70.0 in | Wt 183.1 lb

## 2012-08-12 DIAGNOSIS — I2510 Atherosclerotic heart disease of native coronary artery without angina pectoris: Secondary | ICD-10-CM | POA: Diagnosis not present

## 2012-08-12 DIAGNOSIS — T148XXA Other injury of unspecified body region, initial encounter: Secondary | ICD-10-CM

## 2012-08-12 DIAGNOSIS — I5022 Chronic systolic (congestive) heart failure: Secondary | ICD-10-CM | POA: Insufficient documentation

## 2012-08-12 DIAGNOSIS — I4891 Unspecified atrial fibrillation: Secondary | ICD-10-CM | POA: Diagnosis not present

## 2012-08-12 LAB — CBC
MCV: 90.8 fL (ref 78.0–100.0)
Platelets: 193 10*3/uL (ref 150–400)
RBC: 4.11 MIL/uL — ABNORMAL LOW (ref 4.22–5.81)
WBC: 6 10*3/uL (ref 4.0–10.5)

## 2012-08-12 LAB — COMPREHENSIVE METABOLIC PANEL
ALT: 14 U/L (ref 0–53)
AST: 19 U/L (ref 0–37)
CO2: 27 mEq/L (ref 19–32)
Chloride: 109 mEq/L (ref 96–112)
Creatinine, Ser: 1.64 mg/dL — ABNORMAL HIGH (ref 0.50–1.35)
GFR calc non Af Amer: 38 mL/min — ABNORMAL LOW (ref >90)
Sodium: 144 mEq/L (ref 135–145)
Total Bilirubin: 0.4 mg/dL (ref 0.3–1.2)

## 2012-08-12 NOTE — Assessment & Plan Note (Signed)
Will check CBC 

## 2012-08-12 NOTE — Progress Notes (Signed)
Patient ID: [name], male   DOB: 10-Nov-1931, 78 y.o.   MRN: [id number] Urologist: Dr [name] HPI: [name] is delightful 78 year old male with a history of coronary artery disease, status post previous large anterior wall myocardial infarction.  This has been complicated by congestive heart failure.  Ejection fraction of 25%.  He is status post placement of a St. Jude BiV ICD.  He also has had multivessel stenting in the past. Remainder of his medical history is notable for atrial fibrillation,maintaining sinus rhythm on amiodarone.  He is not on Coumadin secondary to GI bleed, chronic renal insufficiency with baseline creatinine about 1.5-2.0, hypertension, hyperlipidemia, and a systemic tremor.  In 2012 had NYHA III-IIIB symptoms. We discussed possibility of RHC and inotropes but he wanted to defer. We decreased coreg and had labs. Also underwent AV optimization. Improved subsequently.  He returns for follow up with his wife. Recently diagnosed with bladder cancer which was excised in August 2013. Pending BCG treatments.   Doing well from a cardiac perspective. Back to his regular activities. "I do pretty much what I want to." Occasional urinary incontinence. Denies dizziness/SOB/PND/Orthopnea/CP.  Complaint with medications. Weighing daily weight stable 177-179.  Bruising easily  ROS: All systems negative except as listed in HPI, PMH and Problem List.  Past Medical History  Diagnosis Date  . CAD (coronary artery disease)      a. s/p anterior MI 12/05 c/b shock -> stent LAD   b. s/p stenting OM-1, 2/06  . CHF (congestive heart failure)     due to ischemic CM  a. EF 20-30%. (Nov 2008)   b. s/p St. Jude BiV-ICD    c. CPX 07/2008  pvo2 16.3 (63% predicted) slope 34 RER 1.08 O2 pulse 93%  . Hyperlipidemia   . Hypothyroidism   . GERD (gastroesophageal reflux disease)     hx Barretts esophagitis  . Anemia   . 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 Myocardial infarction 09/15/2004  . History of blood transfusion     following coumadin  usage  . Cough     occasional /productive, no fever/ not new  . ICD (implantable cardiac defibrillator) in place     BiV/ICD  . Skin cancer     basal cells   facial x 4, 1 right forearm  . Bladder cancer dx'd 05/2012  . HTN (hypertension)     EKG 05/14/12,Chest x ray 8/13, last ICD interrogation 8/13 EPIC  . Atrial fibrillation or flutter     maintaining sinus on amiodarone    . Sleep apnea     STOP BANG SCORE 4  . Pacemaker   . CRI (chronic renal insufficiency)     (baseline 2.0-2.2)/ recent hospitalization 8/13    Current Outpatient Prescriptions  Medication Sig Dispense Refill  . amiodarone (PACERONE) 200 MG tablet Take 100 mg by mouth daily with breakfast.       . carvedilol (COREG) 6.25 MG tablet Take 1 tablet (6.25 mg total) by mouth 2 (two) times daily with a meal.  60 tablet  2  . digoxin (LANOXIN) 0.125 MG tablet Take 0.125 mg by mouth daily with breakfast. 1/2 tablet- .125 mg  tablets      . ezetimibe (ZETIA) 10 MG tablet Take 10 mg by mouth daily with supper.       . ferrous sulfate (CVS IRON) 325 (65 FE) MG tablet Take 325 mg by mouth 3 (three) times a  week. 3 times a week      . furosemide (LASIX) 20 MG tablet Take 20 mg by mouth daily with breakfast.       . KLOR-CON M10 10 MEQ tablet TAKE ONE TABLET  BY MOUTH EVERY DAY  60 tablet  2  . levothyroxine (SYNTHROID, LEVOTHROID) 50 MCG tablet Take 50 mcg by mouth daily before breakfast.       . Multiple Vitamins-Minerals (CENTRUM SILVER PO) Take 0.5 tablets by mouth 2 (two) times daily.      . nitroGLYCERIN (NITROSTAT) 0.4 MG SL tablet Place 0.4 mg under the tongue every 5 (five) minutes as needed. For chest pain      . pantoprazole (PROTONIX) 40 MG tablet Take 1 tablet by mouth every evening.       . rosuvastatin (CRESTOR) 20 MG tablet Take 20 mg by mouth daily with supper.       . valsartan (DIOVAN) 80 MG tablet Take 40  mg by mouth 2 (two) times daily.      . ciprofloxacin (CIPRO) 250 MG tablet Take 1 tablet (250 mg total) by mouth 2 (two) times daily.  20 tablet  0  . HYDROcodone-acetaminophen (NORCO/VICODIN) 5-325 MG per tablet Take 1-2 tablets by mouth every 4 (four) hours as needed for pain.  40 tablet  0  . neomycin-bacitracin-polymyxin (NEOSPORIN) [phone] ointment Apply 1 application topically 4 (four) times daily. Apply to cath insertion      . oxybutynin (DITROPAN) 5 MG tablet Take 1 tablet (5 mg total) by mouth every 6 (six) hours as needed.  40 tablet  4  . phenazopyridine (PYRIDIUM) 100 MG tablet Take 2 tablets (200 mg total) by mouth every 8 (eight) hours as needed for pain (Burning urination.  Will turn urine and body fluids orange.).  30 tablet  1  . potassium chloride (K-DUR) 10 MEQ tablet Take 10 mEq by mouth daily with breakfast.       . senna-docusate (SENOKOT-S) 8.6-50 MG per tablet Take 1 tablet by mouth 2 (two) times daily.  60 tablet  0     PHYSICAL EXAM: Filed Vitals:   08/12/12 1032  BP: 130/62  Pulse: 104    General:  Looks good.  no resp difficulty (wife present)  HEENT: normal Neck: supple. JVP 5-6 Carotids 2+ bilat; no bruits. Cor: PMI laterally displaced. Regular rate & rhythm. No rubs, gallops, 2/6 SEM murmur at apex Lungs: clear with decreased air movement (mild) Abdomen: soft, nontender, distended  No hepatosplenomegaly. No bruits or masses. Good bowel sounds. Extremities: no cyanosis, clubbing, rash, edema. +vericose veins. + multiple ecchymosis Neuro: alert & orientedx3, cranial nerves grossly intact. moves all 4 extremities w/o difficulty. affect pleasant      ASSESSMENT & PLAN:

## 2012-08-12 NOTE — Patient Instructions (Addendum)
We will contact you in 3 months to schedule your next appointment.  

## 2012-08-12 NOTE — Assessment & Plan Note (Signed)
Maintaining SR on amio. Refused coumadin due to bleeding.

## 2012-08-12 NOTE — Assessment & Plan Note (Signed)
Stable NYHA II-early III. Volume status looks good. Unable to titrate HF medications due to soft SBP. Follow up in 3 months.

## 2012-08-12 NOTE — Assessment & Plan Note (Signed)
No evidence of ischemia. Continue current regimen.   

## 2012-08-25 DIAGNOSIS — C679 Malignant neoplasm of bladder, unspecified: Secondary | ICD-10-CM | POA: Diagnosis not present

## 2012-09-01 DIAGNOSIS — R82998 Other abnormal findings in urine: Secondary | ICD-10-CM | POA: Diagnosis not present

## 2012-09-07 ENCOUNTER — Other Ambulatory Visit (HOSPITAL_COMMUNITY): Payer: Self-pay | Admitting: *Deleted

## 2012-09-07 MED ORDER — EZETIMIBE 10 MG PO TABS: 10.0000 mg | ORAL_TABLET | Freq: Every day | ORAL | Status: DC

## 2012-09-07 MED ORDER — CLOPIDOGREL BISULFATE 75 MG PO TABS: 75.0000 mg | ORAL_TABLET | Freq: Every day | ORAL | Status: DC

## 2012-09-07 MED ORDER — ROSUVASTATIN CALCIUM 20 MG PO TABS: 20.0000 mg | ORAL_TABLET | Freq: Every day | ORAL | Status: DC

## 2012-09-08 DIAGNOSIS — C679 Malignant neoplasm of bladder, unspecified: Secondary | ICD-10-CM | POA: Diagnosis not present

## 2012-09-08 DIAGNOSIS — M539 Dorsopathy, unspecified: Secondary | ICD-10-CM | POA: Diagnosis not present

## 2012-09-14 DIAGNOSIS — I4891 Unspecified atrial fibrillation: Secondary | ICD-10-CM | POA: Diagnosis not present

## 2012-09-14 DIAGNOSIS — E039 Hypothyroidism, unspecified: Secondary | ICD-10-CM | POA: Diagnosis not present

## 2012-09-14 DIAGNOSIS — Z0000 Encounter for general adult medical examination without abnormal findings: Secondary | ICD-10-CM | POA: Diagnosis not present

## 2012-09-14 DIAGNOSIS — I2589 Other forms of chronic ischemic heart disease: Secondary | ICD-10-CM | POA: Diagnosis not present

## 2012-09-15 DIAGNOSIS — C679 Malignant neoplasm of bladder, unspecified: Secondary | ICD-10-CM | POA: Diagnosis not present

## 2012-09-17 DIAGNOSIS — E039 Hypothyroidism, unspecified: Secondary | ICD-10-CM | POA: Diagnosis not present

## 2012-09-17 DIAGNOSIS — E785 Hyperlipidemia, unspecified: Secondary | ICD-10-CM | POA: Diagnosis not present

## 2012-09-21 ENCOUNTER — Other Ambulatory Visit: Payer: Self-pay | Admitting: Internal Medicine

## 2012-09-21 DIAGNOSIS — R300 Dysuria: Secondary | ICD-10-CM | POA: Diagnosis not present

## 2012-09-21 DIAGNOSIS — R509 Fever, unspecified: Secondary | ICD-10-CM | POA: Diagnosis not present

## 2012-09-21 DIAGNOSIS — R6889 Other general symptoms and signs: Secondary | ICD-10-CM | POA: Diagnosis not present

## 2012-09-22 ENCOUNTER — Encounter: Payer: Medicare Other | Admitting: Internal Medicine

## 2012-09-24 ENCOUNTER — Ambulatory Visit
Admission: RE | Admit: 2012-09-24 | Discharge: 2012-09-24 | Disposition: A | Discharge status: Home / Self Care | Payer: Medicare Other | Source: Ambulatory Visit | Attending: Internal Medicine | Admitting: Internal Medicine

## 2012-09-24 DIAGNOSIS — N189 Chronic kidney disease, unspecified: Secondary | ICD-10-CM | POA: Diagnosis not present

## 2012-09-28 DIAGNOSIS — Z961 Presence of intraocular lens: Secondary | ICD-10-CM | POA: Diagnosis not present

## 2012-09-28 DIAGNOSIS — H2510 Age-related nuclear cataract, unspecified eye: Secondary | ICD-10-CM | POA: Diagnosis not present

## 2012-09-29 DIAGNOSIS — R300 Dysuria: Secondary | ICD-10-CM | POA: Diagnosis not present

## 2012-09-29 DIAGNOSIS — C679 Malignant neoplasm of bladder, unspecified: Secondary | ICD-10-CM | POA: Diagnosis not present

## 2012-09-29 DIAGNOSIS — N289 Disorder of kidney and ureter, unspecified: Secondary | ICD-10-CM | POA: Diagnosis not present

## 2012-10-02 DIAGNOSIS — C679 Malignant neoplasm of bladder, unspecified: Secondary | ICD-10-CM | POA: Diagnosis not present

## 2012-10-02 DIAGNOSIS — N179 Acute kidney failure, unspecified: Secondary | ICD-10-CM | POA: Diagnosis not present

## 2012-10-08 ENCOUNTER — Encounter: Payer: Self-pay | Admitting: *Deleted

## 2012-10-09 DIAGNOSIS — R82998 Other abnormal findings in urine: Secondary | ICD-10-CM | POA: Diagnosis not present

## 2012-10-12 DIAGNOSIS — I4891 Unspecified atrial fibrillation: Secondary | ICD-10-CM | POA: Diagnosis not present

## 2012-10-12 DIAGNOSIS — N179 Acute kidney failure, unspecified: Secondary | ICD-10-CM | POA: Diagnosis not present

## 2012-10-20 ENCOUNTER — Telehealth: Payer: Self-pay | Admitting: *Deleted

## 2012-10-20 NOTE — Telephone Encounter (Signed)
Pt's wife called in regarding letter for device check. Pt received letter for needing device check. Pt is currently under treatment for bladder cancer and will end treatments end of this week. Pt's wife will call back and reschedule once treatments are complete.

## 2012-10-23 ENCOUNTER — Other Ambulatory Visit: Payer: Self-pay | Admitting: Internal Medicine

## 2012-10-28 ENCOUNTER — Ambulatory Visit (INDEPENDENT_AMBULATORY_CARE_PROVIDER_SITE_OTHER): Payer: Medicare Other | Admitting: *Deleted

## 2012-10-28 ENCOUNTER — Encounter: Payer: Self-pay | Admitting: Internal Medicine

## 2012-10-28 DIAGNOSIS — I2589 Other forms of chronic ischemic heart disease: Secondary | ICD-10-CM

## 2012-10-28 DIAGNOSIS — I5022 Chronic systolic (congestive) heart failure: Secondary | ICD-10-CM

## 2012-10-28 LAB — ICD DEVICE OBSERVATION
AL THRESHOLD: 0.75 V
ATRIAL PACING ICD: 96 pct
BAMS-0001: 170 {beats}/min
FVT: 0
LV LEAD THRESHOLD: 1.25 V
PACEART VT: 0
RV LEAD AMPLITUDE: 8.2 mv
TZAT-0001FASTVT: 1
TZAT-0004FASTVT: 8
TZAT-0013SLOWVT: 3
TZAT-0018SLOWVT: NEGATIVE
TZAT-0019FASTVT: 7.5 V
TZAT-0019SLOWVT: 7.5 V
TZAT-0020SLOWVT: 1 ms
TZON-0003SLOWVT: 350 ms
TZON-0004FASTVT: 12
TZON-0004SLOWVT: 12
TZON-0005SLOWVT: 6
TZON-0010FASTVT: 80 ms
TZON-0010SLOWVT: 80 ms
TZST-0001FASTVT: 2
TZST-0001FASTVT: 3
TZST-0001FASTVT: 4
TZST-0001SLOWVT: 2
TZST-0001SLOWVT: 5
TZST-0003FASTVT: 36 J
TZST-0003FASTVT: 36 J
TZST-0003FASTVT: 36 J
TZST-0003SLOWVT: 30 J
TZST-0003SLOWVT: 36 J
VENTRICULAR PACING ICD: 99.96 pct

## 2012-10-28 NOTE — Progress Notes (Signed)
ICD check

## 2012-11-06 ENCOUNTER — Other Ambulatory Visit (HOSPITAL_COMMUNITY): Payer: Self-pay | Admitting: Internal Medicine

## 2012-11-09 ENCOUNTER — Encounter (HOSPITAL_COMMUNITY): Payer: Medicare Other

## 2012-11-16 ENCOUNTER — Ambulatory Visit (HOSPITAL_COMMUNITY)
Admission: RE | Admit: 2012-11-16 | Discharge: 2012-11-16 | Disposition: A | Discharge status: Home / Self Care | Payer: Medicare Other | Source: Ambulatory Visit | Attending: Internal Medicine | Admitting: Internal Medicine

## 2012-11-16 VITALS — BP 112/60 | HR 88 | Wt 184.0 lb

## 2012-11-16 DIAGNOSIS — I4891 Unspecified atrial fibrillation: Secondary | ICD-10-CM

## 2012-11-16 DIAGNOSIS — I5022 Chronic systolic (congestive) heart failure: Secondary | ICD-10-CM | POA: Insufficient documentation

## 2012-11-16 DIAGNOSIS — I2510 Atherosclerotic heart disease of native coronary artery without angina pectoris: Secondary | ICD-10-CM | POA: Diagnosis not present

## 2012-11-16 NOTE — Patient Instructions (Addendum)
Follow up in 3 months   Do the following things EVERYDAY: 1) Weigh yourself in the morning before breakfast. Write it down and keep it in a log. 2) Take your medicines as prescribed 3) Eat low salt foods-Limit salt (sodium) to 2000 mg per day.  4) Stay as active as you can everyday 5) Limit all fluids for the day to less than 2 liters  

## 2012-11-16 NOTE — Assessment & Plan Note (Addendum)
Doing well. NYHA II-III. No change. Volume status stable. Continue current diuretic regimen. Unable to tolerate further titration of HF meds. Repeat ECHO. Follow up in 3 months.

## 2012-11-19 ENCOUNTER — Other Ambulatory Visit (HOSPITAL_COMMUNITY): Payer: Self-pay | Admitting: Internal Medicine

## 2012-11-24 DIAGNOSIS — I10 Essential (primary) hypertension: Secondary | ICD-10-CM | POA: Diagnosis not present

## 2012-11-24 DIAGNOSIS — I2589 Other forms of chronic ischemic heart disease: Secondary | ICD-10-CM | POA: Diagnosis not present

## 2012-11-24 DIAGNOSIS — N179 Acute kidney failure, unspecified: Secondary | ICD-10-CM | POA: Diagnosis not present

## 2012-12-02 ENCOUNTER — Other Ambulatory Visit (HOSPITAL_COMMUNITY): Payer: Self-pay | Admitting: Internal Medicine

## 2012-12-12 NOTE — Progress Notes (Signed)
Patient ID: [name], male   DOB: March 18, 1932, 79 y.o.   MRN: [id number] Urologist: Dr [name] HPI: [name] is delightful 79 year old male with a history of coronary artery disease, status post previous large anterior wall myocardial infarction.  This has been complicated by congestive heart failure.  Ejection fraction of 25%.  He is status post placement of a St. Jude BiV ICD.  He also has had multivessel stenting in the past. Remainder of his medical history is notable for atrial fibrillation,maintaining sinus rhythm on amiodarone.  He is not on Coumadin secondary to GI bleed, chronic renal insufficiency with baseline creatinine about 1.5-2.0, hypertension, hyperlipidemia, and a systemic tremor.Bladder cancer 05/2012. Completed BCG treatment.   In 2012 had NYHA III-IIIB symptoms. We discussed possibility of RHC and inotropes but he wanted to defer.   He returns for follow up. Urologist cut back diuretics to 20 mg tid and Diovan 40 mg daily due to elevated kidney function. Plan for repeat cystoscope in a few months. Denies SOB/PND/Orthopnea. Complains of fatigue. Weight at home 180-181 pounds. Complaint with medications. Appetite improved off treatment. Denies lower extremity edema.   ROS: All systems negative except as listed in HPI, PMH and Problem List.  Past Medical History  Diagnosis Date  . CAD (coronary artery disease)      a. s/p anterior MI 12/05 c/b shock -> stent LAD   b. s/p stenting OM-1, 2/06  . CHF (congestive heart failure)     due to ischemic CM  a. EF 20-30%. (Nov 2008)   b. s/p St. Jude BiV-ICD    c. CPX 07/2008  pvo2 16.3 (63% predicted) slope 34 RER 1.08 O2 pulse 93%  . Hyperlipidemia   . Hypothyroidism   . GERD (gastroesophageal reflux disease)     hx Barretts esophagitis  . Anemia   . 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 Myocardial infarction 09/15/2004  . History of blood transfusion     following coumadin  usage   . Cough     occasional /productive, no fever/ not new  . ICD (implantable cardiac defibrillator) in place     BiV/ICD  . Skin cancer     basal cells   facial x 4, 1 right forearm  . Bladder cancer dx'd 05/2012  . HTN (hypertension)     EKG 05/14/12,Chest x ray 8/13, last ICD interrogation 8/13 EPIC  . Atrial fibrillation or flutter     maintaining sinus on amiodarone    . Sleep apnea     STOP BANG SCORE 4  . Pacemaker   . CRI (chronic renal insufficiency)     (baseline 2.0-2.2)/ recent hospitalization 8/13    Current Outpatient Prescriptions  Medication Sig Dispense Refill  . aspirin 325 MG tablet Take 325 mg by mouth daily.      . carvedilol (COREG) 6.25 MG tablet TAKE ONE TABLET BY MOUTH TWICE DAILY WITH MEALS  60 tablet  1  . CRESTOR 20 MG tablet TAKE ONE TABLET BY MOUTH EVERY DAY WITH  SUPPER  30 tablet  4  . digoxin (LANOXIN) 0.125 MG tablet Take 0.0625 mg by mouth daily.       . ferrous sulfate (CVS IRON) 325 (65 FE) MG tablet Take 325 mg by mouth 3 (three) times a week. 3 times a week      . furosemide (LASIX) 20 MG tablet Take 20 mg by mouth 3 (three) times a week.       Marland Kitchen  HYDROcodone-acetaminophen (NORCO/VICODIN) 5-325 MG per tablet Take 1-2 tablets by mouth every 4 (four) hours as needed for pain.  40 tablet  0  . levothyroxine (SYNTHROID, LEVOTHROID) 50 MCG tablet Take 50 mcg by mouth daily before breakfast.       . Multiple Vitamins-Minerals (CENTRUM SILVER PO) Take 0.5 tablets by mouth 2 (two) times daily.      Marland Kitchen neomycin-bacitracin-polymyxin (NEOSPORIN) [phone] ointment Apply 1 application topically 4 (four) times daily. Apply to cath insertion      . nitroGLYCERIN (NITROSTAT) 0.4 MG SL tablet Place 0.4 mg under the tongue every 5 (five) minutes as needed. For chest pain      . oxybutynin (DITROPAN) 5 MG tablet Take 1 tablet (5 mg total) by mouth every 6 (six) hours as needed.  40 tablet  4  . pantoprazole (PROTONIX) 40 MG tablet Take 1 tablet by mouth every evening.        . phenazopyridine (PYRIDIUM) 100 MG tablet Take 2 tablets (200 mg total) by mouth every 8 (eight) hours as needed for pain (Burning urination.  Will turn urine and body fluids orange.).  30 tablet  1  . potassium chloride (K-DUR) 10 MEQ tablet Take 10 mEq by mouth daily with breakfast.       . senna-docusate (SENOKOT-S) 8.6-50 MG per tablet Take 1 tablet by mouth as needed.      . valsartan (DIOVAN) 80 MG tablet Take 40 mg by mouth daily.       . clopidogrel (PLAVIX) 75 MG tablet TAKE ONE TABLET BY MOUTH EVERY DAY  30 tablet  0  . PACERONE 200 MG tablet TAKE ONE-HALF TABLET BY MOUTH EVERY DAY  45 tablet  3  . ZETIA 10 MG tablet TAKE ONE TABLET BY MOUTH EVERY DAY WITH SUPPER  30 tablet  12   No current facility-administered medications for this encounter.     PHYSICAL EXAM: Filed Vitals:   11/16/12 1334  BP: 112/60  Pulse: 88    General:  Elderly.   no resp difficulty (wife present)  HEENT: normal Neck: supple. JVP 6-7 Carotids 2+ bilat; no bruits. Cor: PMI laterally displaced. Regular rate & rhythm. No rubs, gallops, 2/6 SEM murmur at apex Lungs: clear with decreased air movement (mild) Abdomen: soft, nontender, distended  No hepatosplenomegaly. No bruits or masses. Good bowel sounds. Extremities: no cyanosis, clubbing, rash, edema. +vericose veins. + multiple ecchymosis Neuro: alert & orientedx3, cranial nerves grossly intact. moves all 4 extremities w/o difficulty. affect pleasant      ASSESSMENT & PLAN:

## 2012-12-12 NOTE — Assessment & Plan Note (Signed)
No evidence of ischemia. Continue current regimen.   

## 2012-12-12 NOTE — Assessment & Plan Note (Signed)
Maintaining SR on low-dose amio. Not coumadin candidate due to previous GIBs. Continue ASA.

## 2012-12-15 ENCOUNTER — Ambulatory Visit (INDEPENDENT_AMBULATORY_CARE_PROVIDER_SITE_OTHER): Payer: Medicare Other | Admitting: Internal Medicine

## 2012-12-15 ENCOUNTER — Encounter: Payer: Self-pay | Admitting: Internal Medicine

## 2012-12-15 VITALS — BP 118/80 | HR 61 | Wt 183.0 lb

## 2012-12-15 DIAGNOSIS — I2589 Other forms of chronic ischemic heart disease: Secondary | ICD-10-CM

## 2012-12-15 DIAGNOSIS — I4891 Unspecified atrial fibrillation: Secondary | ICD-10-CM

## 2012-12-15 LAB — ICD DEVICE OBSERVATION
AL IMPEDENCE ICD: 462.5 Ohm
ATRIAL PACING ICD: 99.24 pct
BAMS-0001: 170 {beats}/min
BAMS-0003: 70 {beats}/min
BATTERY VOLTAGE: 2.5418 V
CHARGE TIME: 14 s
DEVICE MODEL ICD: 528626
FVT: 0
HV IMPEDENCE: 45 Ohm
PACEART VT: 0
RV LEAD IMPEDENCE ICD: 375 Ohm
TOT-0006: 20090417000000
TOT-0008: 0
TOT-0009: 1
TOT-0010: 33
TZAT-0004FASTVT: 8
TZAT-0004SLOWVT: 8
TZAT-0012FASTVT: 200 ms
TZAT-0012SLOWVT: 200 ms
TZAT-0013FASTVT: 1
TZAT-0018FASTVT: NEGATIVE
TZAT-0018SLOWVT: NEGATIVE
TZAT-0019FASTVT: 7.5 V
TZAT-0020FASTVT: 1 ms
TZON-0003FASTVT: 285 ms
TZON-0003SLOWVT: 350 ms
TZON-0004FASTVT: 12
TZON-0005FASTVT: 6
TZON-0010SLOWVT: 80 ms
TZST-0001FASTVT: 3
TZST-0001FASTVT: 4
TZST-0001SLOWVT: 3
TZST-0001SLOWVT: 5
TZST-0003FASTVT: 36 J
TZST-0003FASTVT: 36 J
TZST-0003FASTVT: 36 J
TZST-0003SLOWVT: 25 J
TZST-0003SLOWVT: 36 J

## 2012-12-15 NOTE — Assessment & Plan Note (Signed)
No intercurrent atrial fibrillation. He is not on anticoagulation. This has been deferred secondary to GI bleeding. It is likely that this could possibly be resumed.

## 2012-12-15 NOTE — Patient Instructions (Signed)
Remote monitoring is used to monitor your Pacemaker of ICD from home. This monitoring reduces the number of office visits required to check your device to one time per year. It allows [place] to keep an eye on the functioning of your device to ensure it is working properly. You are scheduled for a device check from home on 03/15/13. You may send your transmission at any time that day. If you have a wireless device, the transmission will be sent automatically. After your physician reviews your transmission, you will receive a postcard with your next transmission date.   Your physician wants you to follow-up in: 1 year with Dr. Caryl Comes. You will receive a reminder letter in the mail two months in advance. If you don't receive a letter, please call our office to schedule the follow-up appointment.  Your physician recommends that you continue on your current medications as directed. Please refer to the Current Medication list given to you today.

## 2012-12-15 NOTE — Assessment & Plan Note (Signed)
Relatively stable. I will discuss with Dr. Reine Just as to whether we can reduce aspirin dose is also on Plavix

## 2012-12-15 NOTE — Assessment & Plan Note (Signed)
euvolemic but significantly fatigued. I wonder if there is a role of find using alternative beta blocker. I will defer this to Dr. Reine Just

## 2012-12-15 NOTE — Progress Notes (Signed)
Patient Care Team: [name], MD as PCP - General (Internal Medicine)   HPI  [name] is a 79 y.o. male Seen in followup for CRT-D implanted for congestive heart failure in the setting of ischemic heart disease. His had multiple prior revascularization by standing. It is a large anterior wall MI.  Also has a history of atrial fibrillation previously treated with amiodarone and not on anticoagulation secondary to GI bleeding: He has grade 3 kidney disease  He is undergoing chemotherapy for bladder cancer which has been very challenging. It makes her feel very cold.  Past Medical History  Diagnosis Date  . CAD (coronary artery disease)      a. s/p anterior MI 12/05 c/b shock -> stent LAD   b. s/p stenting OM-1, 2/06  . CHF (congestive heart failure)     due to ischemic CM  a. EF 20-30%. (Nov 2008)   b. s/p St. Jude BiV-ICD    c. CPX 07/2008  pvo2 16.3 (63% predicted) slope 34 RER 1.08 O2 pulse 93%  . Hyperlipidemia   . Hypothyroidism   . GERD (gastroesophageal reflux disease)     hx Barretts esophagitis  . Anemia   . 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 Myocardial infarction 09/15/2004  . History of blood transfusion     following coumadin  usage  . Cough     occasional /productive, no fever/ not new  . ICD (implantable cardiac defibrillator) in place     BiV/ICD  . Skin cancer     basal cells   facial x 4, 1 right forearm  . Bladder cancer dx'd 05/2012  . HTN (hypertension)     EKG 05/14/12,Chest x ray 8/13, last ICD interrogation 8/13 EPIC  . Atrial fibrillation or flutter     maintaining sinus on amiodarone    . Sleep apnea     STOP BANG SCORE 4  . Pacemaker   . CRI (chronic renal insufficiency)     (baseline 2.0-2.2)/ recent hospitalization 8/13    Past Surgical History  Procedure Laterality Date  . Cervical laminectomy  1985  . Back surgery  1972    lower back  . Cardiac defibrillator placement      ICD St  Jude  . Hernia repair  1989    bilateral  . Esophagogastroduodenoscopy    . Colonoscopy    . Coronary angioplasty  [id number]  . Transurethral resection of bladder tumor  05/25/2012    cold cup biopsy prostate  . Cystoscopy w/ retrogrades  05/25/2012    Procedure: CYSTOSCOPY WITH RETROGRADE PYELOGRAM;  Surgeon: Molli Hazard, MD;  Location: [facility];  Service: Urology;  Laterality: Bilateral;  . Transurethral resection of bladder tumor  07/08/2012    Procedure: TRANSURETHRAL RESECTION OF BLADDER TUMOR (TURBT);  Surgeon: Molli Hazard, MD;  Location: [facility];  Service: Urology;  Laterality: N/A;  CYSTO, TURBT W/ GYRUS, BILATERAL STENT REMOVAL, LEFT URETEROSCOPY WITH BIOPSY, POSSIBLE LEFT STENT PLACEMENT    . Cystoscopy w/ ureteral stent removal  07/08/2012    Procedure: CYSTOSCOPY WITH STENT REMOVAL;  Surgeon: Molli Hazard, MD;  Location: [facility];  Service: Urology;  Laterality: Bilateral;  . Cystoscopy w/ ureteral stent placement  07/08/2012    Procedure: CYSTOSCOPY WITH STENT REPLACEMENT;  Surgeon: Molli Hazard, MD;  Location: [facility];  Service: Urology;  Laterality: Left;  . Cystoscopy/retrograde/ureteroscopy  07/08/2012    Procedure: CYSTOSCOPY/RETROGRADE/URETEROSCOPY;  Surgeon: Molli Hazard, MD;  Location: [facility];  Service: Urology;  Laterality: Left;    Current Outpatient Prescriptions  Medication Sig Dispense Refill  . aspirin 325 MG tablet Take 325 mg by mouth daily.      . carvedilol (COREG) 6.25 MG tablet TAKE ONE TABLET BY MOUTH TWICE DAILY WITH MEALS  60 tablet  1  . clopidogrel (PLAVIX) 75 MG tablet TAKE ONE TABLET BY MOUTH EVERY DAY  30 tablet  0  . CRESTOR 20 MG tablet TAKE ONE TABLET BY MOUTH EVERY DAY WITH  SUPPER  30 tablet  4  . digoxin (LANOXIN) 0.125 MG tablet Take 0.0625 mg by mouth daily.       . ferrous sulfate ([facility] IRON) 325 (65 FE) MG tablet Take 325 mg by mouth 3 (three) times a week. 3 times a week      . furosemide (LASIX) 20 MG  tablet Take 20 mg by mouth 3 (three) times a week.       . levothyroxine (SYNTHROID, LEVOTHROID) 50 MCG tablet Take 50 mcg by mouth daily before breakfast.       . Multiple Vitamins-Minerals (CENTRUM SILVER PO) Take 0.5 tablets by mouth 2 (two) times daily.      Marland Kitchen neomycin-bacitracin-polymyxin (NEOSPORIN) [phone] ointment Apply 1 application topically 4 (four) times daily. Apply to cath insertion      . nitroGLYCERIN (NITROSTAT) 0.4 MG SL tablet Place 0.4 mg under the tongue every 5 (five) minutes as needed. For chest pain      . PACERONE 200 MG tablet TAKE ONE-HALF TABLET BY MOUTH EVERY DAY  45 tablet  3  . pantoprazole (PROTONIX) 40 MG tablet Take 1 tablet by mouth every evening.       . phenazopyridine (PYRIDIUM) 100 MG tablet Take 2 tablets (200 mg total) by mouth every 8 (eight) hours as needed for pain (Burning urination.  Will turn urine and body fluids orange.).  30 tablet  1  . potassium chloride (K-DUR) 10 MEQ tablet Take 10 mEq by mouth daily with breakfast.       . senna-docusate (SENOKOT-S) 8.6-50 MG per tablet Take 1 tablet by mouth as needed.      . valsartan (DIOVAN) 80 MG tablet Take 40 mg by mouth daily.       Marland Kitchen ZETIA 10 MG tablet TAKE ONE TABLET BY MOUTH EVERY DAY WITH SUPPER  30 tablet  12   No current facility-administered medications for this visit.    Allergies  Allergen Reactions  . Ramipril Other (See Comments)    cough  . Amoxicillin Rash  . Penicillins Rash    Review of Systems negative except from HPI and PMH  Physical Exam BP 118/80  Pulse 61  Wt 183 lb (83.008 kg)  BMI 26.26 kg/m2 Well developed and cachectic in no acute distress HENT normal E scleral and icterus clear Neck Supple JVP flat; carotids brisk and full Clear to ausculation  Regular rate and rhythm, no murmurs gallops or rub Soft with active bowel sounds No clubbing cyanosis none Edema Alert and oriented, grossly normal motor and sensory function Skin Warm and Dry  ECG  demonstrates AV pacing  Assessment and  Plan

## 2012-12-15 NOTE — Assessment & Plan Note (Signed)
The patient's device was interrogated.  The information was reviewed. No changes were made in the programming.   He is approaching ERI. A change out probably would be appropriate to change out for CRT P.

## 2012-12-21 ENCOUNTER — Encounter (INDEPENDENT_AMBULATORY_CARE_PROVIDER_SITE_OTHER): Payer: Medicare Other

## 2012-12-21 DIAGNOSIS — I6529 Occlusion and stenosis of unspecified carotid artery: Secondary | ICD-10-CM

## 2012-12-29 ENCOUNTER — Other Ambulatory Visit: Payer: Self-pay | Admitting: Internal Medicine

## 2012-12-31 DIAGNOSIS — H902 Conductive hearing loss, unspecified: Secondary | ICD-10-CM | POA: Diagnosis not present

## 2012-12-31 DIAGNOSIS — H6500 Acute serous otitis media, unspecified ear: Secondary | ICD-10-CM | POA: Diagnosis not present

## 2013-01-04 ENCOUNTER — Other Ambulatory Visit (HOSPITAL_COMMUNITY): Payer: Self-pay | Admitting: Internal Medicine

## 2013-01-06 ENCOUNTER — Encounter: Payer: Self-pay | Admitting: Internal Medicine

## 2013-01-30 ENCOUNTER — Other Ambulatory Visit: Payer: Self-pay | Admitting: Internal Medicine

## 2013-02-08 DIAGNOSIS — Z8551 Personal history of malignant neoplasm of bladder: Secondary | ICD-10-CM | POA: Diagnosis not present

## 2013-02-08 DIAGNOSIS — C679 Malignant neoplasm of bladder, unspecified: Secondary | ICD-10-CM | POA: Diagnosis not present

## 2013-02-11 DIAGNOSIS — I2589 Other forms of chronic ischemic heart disease: Secondary | ICD-10-CM | POA: Diagnosis not present

## 2013-02-11 DIAGNOSIS — I4891 Unspecified atrial fibrillation: Secondary | ICD-10-CM | POA: Diagnosis not present

## 2013-02-11 DIAGNOSIS — I2510 Atherosclerotic heart disease of native coronary artery without angina pectoris: Secondary | ICD-10-CM | POA: Diagnosis not present

## 2013-02-11 DIAGNOSIS — E039 Hypothyroidism, unspecified: Secondary | ICD-10-CM | POA: Diagnosis not present

## 2013-02-11 DIAGNOSIS — C679 Malignant neoplasm of bladder, unspecified: Secondary | ICD-10-CM | POA: Diagnosis not present

## 2013-02-11 DIAGNOSIS — I10 Essential (primary) hypertension: Secondary | ICD-10-CM | POA: Diagnosis not present

## 2013-02-11 DIAGNOSIS — E785 Hyperlipidemia, unspecified: Secondary | ICD-10-CM | POA: Diagnosis not present

## 2013-02-11 DIAGNOSIS — N179 Acute kidney failure, unspecified: Secondary | ICD-10-CM | POA: Diagnosis not present

## 2013-02-16 DIAGNOSIS — C679 Malignant neoplasm of bladder, unspecified: Secondary | ICD-10-CM | POA: Diagnosis not present

## 2013-02-18 ENCOUNTER — Ambulatory Visit (HOSPITAL_COMMUNITY)
Admission: RE | Admit: 2013-02-18 | Discharge: 2013-02-18 | Disposition: A | Discharge status: Home / Self Care | Payer: Medicare Other | Source: Ambulatory Visit | Attending: Internal Medicine | Admitting: Internal Medicine

## 2013-02-18 ENCOUNTER — Other Ambulatory Visit (HOSPITAL_COMMUNITY): Payer: Self-pay | Admitting: Radiology

## 2013-02-18 ENCOUNTER — Ambulatory Visit (HOSPITAL_BASED_OUTPATIENT_CLINIC_OR_DEPARTMENT_OTHER)
Admission: RE | Admit: 2013-02-18 | Discharge: 2013-02-18 | Disposition: A | Discharge status: Home / Self Care | Payer: Medicare Other | Source: Ambulatory Visit | Attending: Internal Medicine | Admitting: Internal Medicine

## 2013-02-18 VITALS — BP 96/50 | Wt 182.0 lb

## 2013-02-18 DIAGNOSIS — I509 Heart failure, unspecified: Secondary | ICD-10-CM | POA: Insufficient documentation

## 2013-02-18 DIAGNOSIS — Z9861 Coronary angioplasty status: Secondary | ICD-10-CM | POA: Diagnosis not present

## 2013-02-18 DIAGNOSIS — E039 Hypothyroidism, unspecified: Secondary | ICD-10-CM | POA: Insufficient documentation

## 2013-02-18 DIAGNOSIS — Z95810 Presence of automatic (implantable) cardiac defibrillator: Secondary | ICD-10-CM | POA: Insufficient documentation

## 2013-02-18 DIAGNOSIS — R259 Unspecified abnormal involuntary movements: Secondary | ICD-10-CM | POA: Diagnosis not present

## 2013-02-18 DIAGNOSIS — N189 Chronic kidney disease, unspecified: Secondary | ICD-10-CM | POA: Insufficient documentation

## 2013-02-18 DIAGNOSIS — I252 Old myocardial infarction: Secondary | ICD-10-CM | POA: Insufficient documentation

## 2013-02-18 DIAGNOSIS — I517 Cardiomegaly: Secondary | ICD-10-CM

## 2013-02-18 DIAGNOSIS — I129 Hypertensive chronic kidney disease with stage 1 through stage 4 chronic kidney disease, or unspecified chronic kidney disease: Secondary | ICD-10-CM | POA: Insufficient documentation

## 2013-02-18 DIAGNOSIS — I5022 Chronic systolic (congestive) heart failure: Secondary | ICD-10-CM | POA: Diagnosis not present

## 2013-02-18 DIAGNOSIS — I2589 Other forms of chronic ischemic heart disease: Secondary | ICD-10-CM | POA: Diagnosis not present

## 2013-02-18 DIAGNOSIS — Z7902 Long term (current) use of antithrombotics/antiplatelets: Secondary | ICD-10-CM | POA: Insufficient documentation

## 2013-02-18 DIAGNOSIS — Z79899 Other long term (current) drug therapy: Secondary | ICD-10-CM | POA: Diagnosis not present

## 2013-02-18 DIAGNOSIS — E785 Hyperlipidemia, unspecified: Secondary | ICD-10-CM | POA: Insufficient documentation

## 2013-02-18 DIAGNOSIS — K219 Gastro-esophageal reflux disease without esophagitis: Secondary | ICD-10-CM | POA: Insufficient documentation

## 2013-02-18 DIAGNOSIS — I2510 Atherosclerotic heart disease of native coronary artery without angina pectoris: Secondary | ICD-10-CM

## 2013-02-18 DIAGNOSIS — I6529 Occlusion and stenosis of unspecified carotid artery: Secondary | ICD-10-CM | POA: Diagnosis not present

## 2013-02-18 DIAGNOSIS — I4891 Unspecified atrial fibrillation: Secondary | ICD-10-CM | POA: Insufficient documentation

## 2013-02-18 DIAGNOSIS — C679 Malignant neoplasm of bladder, unspecified: Secondary | ICD-10-CM | POA: Diagnosis not present

## 2013-02-18 DIAGNOSIS — Z7982 Long term (current) use of aspirin: Secondary | ICD-10-CM | POA: Insufficient documentation

## 2013-02-18 NOTE — Assessment & Plan Note (Signed)
In SR on amio. Refuses coumadin due to h/o GIB. Will need f/u TFTs, CXR and PFTs.

## 2013-02-18 NOTE — Addendum Note (Signed)
Encounter addended by: Scarlette Calico, RN on: 02/18/2013  3:19 PM<BR>     Documentation filed: Patient Instructions Section, Orders

## 2013-02-18 NOTE — Assessment & Plan Note (Signed)
Stable. NYHA III symptoms. Volume status OK. BP soft so will not titrate meds. Continue current regimen. Reinforced need for daily weights and reviewed use of sliding scale diuretics.

## 2013-02-18 NOTE — Assessment & Plan Note (Signed)
U/S in 3/14.  40-59% R 0-39%%L F/u 1 year.

## 2013-02-18 NOTE — Progress Notes (Signed)
Patient ID: [name], male   DOB: 01-15-1932, 79 y.o.   MRN: [id number] Urologist: Dr [name] HPI: [name] is delightful 79 year old male with a history of coronary artery disease, status post previous large anterior wall myocardial infarction.  This has been complicated by congestive heart failure.  Ejection fraction of 25%.  He is status post placement of a St. Jude BiV ICD.  He also has had multivessel stenting in the past. Remainder of his medical history is notable for atrial fibrillation,maintaining sinus rhythm on amiodarone.  He is not on Coumadin secondary to GI bleed, chronic renal insufficiency with baseline creatinine about 1.5-2.0, hypertension, hyperlipidemia, and a systemic tremor.Bladder cancer 05/2012. Completed BCG treatment. He is not on coumadin due to previous GI bleeds.   In 2012 had NYHA III-IIIB symptoms. We discussed possibility of RHC and inotropes but he wanted to defer.   ECHO 02/18/13 EF 25%  He returns for follow up. He continues with BCG  Infusions. Dr Virgina Jock cut back Diovan to 40 mg once a day in December due to renal failure. Denies PND/Orthopnea/CP. Weight at home 176-177. Compliant with medications. Using lasix three times per week. Weights every day and would take extra if needed but hasn't had to. Continues with NYHA Class III fatigue/dyspnea.     ROS: All systems negative except as listed in HPI, PMH and Problem List.  Past Medical History  Diagnosis Date  . CAD (coronary artery disease)      a. s/p anterior MI 12/05 c/b shock -> stent LAD   b. s/p stenting OM-1, 2/06  . CHF (congestive heart failure)     due to ischemic CM  a. EF 20-30%. (Nov 2008)   b. s/p St. Jude BiV-ICD    c. CPX 07/2008  pvo2 16.3 (63% predicted) slope 34 RER 1.08 O2 pulse 93%  . Hyperlipidemia   . Hypothyroidism   . GERD (gastroesophageal reflux disease)     hx Barretts esophagitis  . Anemia   . Neuromuscular disorder     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"   no neurologist   . Myocardial infarction 09/15/2004  . History of blood transfusion     following coumadin  usage  . Cough     occasional /productive, no fever/ not new  . ICD (implantable cardiac defibrillator) in place     BiV/ICD  . Skin cancer     basal cells   facial x 4, 1 right forearm  . Bladder cancer dx'd 05/2012  . HTN (hypertension)     EKG 05/14/12,Chest x ray 8/13, last ICD interrogation 8/13 EPIC  . Atrial fibrillation or flutter     maintaining sinus on amiodarone    . Sleep apnea     STOP BANG SCORE 4  . Pacemaker   . CRI (chronic renal insufficiency)     (baseline 2.0-2.2)/ recent hospitalization 8/13    Current Outpatient Prescriptions  Medication Sig Dispense Refill  . aspirin 325 MG tablet Take 325 mg by mouth daily.      . carvedilol (COREG) 6.25 MG tablet TAKE ONE TABLET BY MOUTH TWICE DAILY WITH MEALS  60 tablet  12  . clopidogrel (PLAVIX) 75 MG tablet TAKE ONE TABLET BY MOUTH ONCE DAILY  30 tablet  5  . CRESTOR 20 MG tablet TAKE ONE TABLET BY MOUTH EVERY DAY WITH  SUPPER  30 tablet  4  . digoxin (LANOXIN) 0.125 MG tablet Take 0.0625 mg by mouth daily.       Marland Kitchen  ferrous sulfate (CVS IRON) 325 (65 FE) MG tablet Take 325 mg by mouth 3 (three) times a week. 3 times a week      . furosemide (LASIX) 20 MG tablet Take 20 mg by mouth 3 (three) times a week.       . levothyroxine (SYNTHROID, LEVOTHROID) 50 MCG tablet Take 50 mcg by mouth daily before breakfast.       . Multiple Vitamins-Minerals (CENTRUM SILVER PO) Take 0.5 tablets by mouth 2 (two) times daily.      Marland Kitchen neomycin-bacitracin-polymyxin (NEOSPORIN) [phone] ointment Apply 1 application topically 4 (four) times daily. Apply to cath insertion      . nitroGLYCERIN (NITROSTAT) 0.4 MG SL tablet Place 0.4 mg under the tongue every 5 (five) minutes as needed. For chest pain      . PACERONE 200 MG tablet TAKE ONE-HALF TABLET BY MOUTH EVERY DAY  45 tablet  3  . pantoprazole (PROTONIX) 40 MG tablet Take 1 tablet by mouth every  evening.       . potassium chloride (K-DUR) 10 MEQ tablet Take 10 mEq by mouth daily with breakfast.       . senna-docusate (SENOKOT-S) 8.6-50 MG per tablet Take 1 tablet by mouth as needed.      . valsartan (DIOVAN) 80 MG tablet Take 40 mg by mouth daily.       Marland Kitchen ZETIA 10 MG tablet TAKE ONE TABLET BY MOUTH EVERY DAY WITH SUPPER  30 tablet  12   No current facility-administered medications for this encounter.     PHYSICAL EXAM: Filed Vitals:   02/18/13 1407  BP: 96/50    General:  Elderly.   no resp difficulty (wife present)  HEENT: normal Neck: supple. JVP 6-7 Carotids 2+ bilat; no bruits. Cor: PMI laterally displaced. Regular rate & rhythm. No rubs, gallops, 2/6 SEM murmur at apex Lungs: clear with decreased air movement (mild) Abdomen: soft, nontender, distended  No hepatosplenomegaly. No bruits or masses. Good bowel sounds. Extremities: no cyanosis, clubbing, rash, edema. +vericose veins. + multiple ecchymosis Neuro: alert & orientedx3, cranial nerves grossly intact. moves all 4 extremities w/o difficulty. affect pleasant      ASSESSMENT & PLAN:

## 2013-02-18 NOTE — Patient Instructions (Addendum)
Chest xray today  Your physician has recommended that you have a pulmonary function test. Pulmonary Function Tests are a group of tests that measure how well air moves in and out of your lungs.  We will contact you in 6 months to schedule your next appointment.

## 2013-02-18 NOTE — Progress Notes (Signed)
  Echocardiogram 2D Echocardiogram has been performed.  [name], [name] 02/18/2013, 1:55 PM

## 2013-02-18 NOTE — Assessment & Plan Note (Signed)
No evidence of ischemia. Continue current regimen.   

## 2013-02-26 DIAGNOSIS — C679 Malignant neoplasm of bladder, unspecified: Secondary | ICD-10-CM | POA: Diagnosis not present

## 2013-03-04 ENCOUNTER — Ambulatory Visit (HOSPITAL_COMMUNITY)
Admission: RE | Admit: 2013-03-04 | Discharge: 2013-03-04 | Disposition: A | Discharge status: Home / Self Care | Payer: Medicare Other | Source: Ambulatory Visit | Attending: Internal Medicine | Admitting: Internal Medicine

## 2013-03-04 DIAGNOSIS — Z79899 Other long term (current) drug therapy: Secondary | ICD-10-CM | POA: Diagnosis not present

## 2013-03-04 DIAGNOSIS — I4891 Unspecified atrial fibrillation: Secondary | ICD-10-CM | POA: Insufficient documentation

## 2013-03-04 DIAGNOSIS — I5022 Chronic systolic (congestive) heart failure: Secondary | ICD-10-CM | POA: Diagnosis not present

## 2013-03-04 MED ORDER — ALBUTEROL SULFATE (5 MG/ML) 0.5% IN NEBU
2.5000 mg | INHALATION_SOLUTION | Freq: Once | RESPIRATORY_TRACT | Status: AC
  Administered 2013-03-04: 2.5 mg via RESPIRATORY_TRACT

## 2013-03-05 DIAGNOSIS — C679 Malignant neoplasm of bladder, unspecified: Secondary | ICD-10-CM | POA: Diagnosis not present

## 2013-03-15 ENCOUNTER — Encounter: Payer: Medicare Other | Admitting: *Deleted

## 2013-03-16 ENCOUNTER — Encounter: Payer: Self-pay | Admitting: *Deleted

## 2013-03-18 ENCOUNTER — Other Ambulatory Visit (HOSPITAL_COMMUNITY): Payer: Self-pay | Admitting: Internal Medicine

## 2013-03-29 ENCOUNTER — Other Ambulatory Visit: Payer: Self-pay | Admitting: *Deleted

## 2013-03-30 ENCOUNTER — Other Ambulatory Visit (HOSPITAL_COMMUNITY): Payer: Self-pay | Admitting: *Deleted

## 2013-03-30 ENCOUNTER — Other Ambulatory Visit: Payer: Self-pay

## 2013-03-30 MED ORDER — DIGOXIN 125 MCG PO TABS: 0.0625 mg | ORAL_TABLET | Freq: Every day | ORAL | Status: DC

## 2013-03-30 MED ORDER — FUROSEMIDE 20 MG PO TABS: 20.0000 mg | ORAL_TABLET | ORAL | Status: DC

## 2013-04-21 ENCOUNTER — Other Ambulatory Visit: Payer: Self-pay

## 2013-04-21 MED ORDER — NITROGLYCERIN 0.4 MG SL SUBL: 0.4000 mg | SUBLINGUAL_TABLET | SUBLINGUAL | Status: DC | PRN

## 2013-04-27 ENCOUNTER — Other Ambulatory Visit: Payer: Self-pay

## 2013-04-27 MED ORDER — ROSUVASTATIN CALCIUM 20 MG PO TABS: ORAL_TABLET | ORAL | Status: DC

## 2013-05-10 DIAGNOSIS — R300 Dysuria: Secondary | ICD-10-CM | POA: Diagnosis not present

## 2013-05-10 DIAGNOSIS — C679 Malignant neoplasm of bladder, unspecified: Secondary | ICD-10-CM | POA: Diagnosis not present

## 2013-05-13 DIAGNOSIS — E039 Hypothyroidism, unspecified: Secondary | ICD-10-CM | POA: Diagnosis not present

## 2013-05-13 DIAGNOSIS — E785 Hyperlipidemia, unspecified: Secondary | ICD-10-CM | POA: Diagnosis not present

## 2013-05-13 DIAGNOSIS — R82998 Other abnormal findings in urine: Secondary | ICD-10-CM | POA: Diagnosis not present

## 2013-05-13 DIAGNOSIS — I10 Essential (primary) hypertension: Secondary | ICD-10-CM | POA: Diagnosis not present

## 2013-05-13 DIAGNOSIS — Z125 Encounter for screening for malignant neoplasm of prostate: Secondary | ICD-10-CM | POA: Diagnosis not present

## 2013-05-21 DIAGNOSIS — N184 Chronic kidney disease, stage 4 (severe): Secondary | ICD-10-CM | POA: Diagnosis not present

## 2013-05-21 DIAGNOSIS — I739 Peripheral vascular disease, unspecified: Secondary | ICD-10-CM | POA: Diagnosis not present

## 2013-05-21 DIAGNOSIS — Z1331 Encounter for screening for depression: Secondary | ICD-10-CM | POA: Diagnosis not present

## 2013-05-21 DIAGNOSIS — I252 Old myocardial infarction: Secondary | ICD-10-CM | POA: Insufficient documentation

## 2013-05-21 DIAGNOSIS — R739 Hyperglycemia, unspecified: Secondary | ICD-10-CM | POA: Insufficient documentation

## 2013-05-21 DIAGNOSIS — Z0000 Encounter for general adult medical examination without abnormal findings: Secondary | ICD-10-CM | POA: Diagnosis not present

## 2013-05-21 DIAGNOSIS — I2510 Atherosclerotic heart disease of native coronary artery without angina pectoris: Secondary | ICD-10-CM | POA: Diagnosis not present

## 2013-05-21 DIAGNOSIS — I2589 Other forms of chronic ischemic heart disease: Secondary | ICD-10-CM | POA: Diagnosis not present

## 2013-05-21 DIAGNOSIS — R7309 Other abnormal glucose: Secondary | ICD-10-CM | POA: Diagnosis not present

## 2013-05-21 DIAGNOSIS — D649 Anemia, unspecified: Secondary | ICD-10-CM | POA: Insufficient documentation

## 2013-05-21 DIAGNOSIS — C4491 Basal cell carcinoma of skin, unspecified: Secondary | ICD-10-CM | POA: Insufficient documentation

## 2013-05-21 DIAGNOSIS — M1990 Unspecified osteoarthritis, unspecified site: Secondary | ICD-10-CM | POA: Diagnosis not present

## 2013-05-21 DIAGNOSIS — R159 Full incontinence of feces: Secondary | ICD-10-CM | POA: Insufficient documentation

## 2013-05-21 DIAGNOSIS — R251 Tremor, unspecified: Secondary | ICD-10-CM | POA: Insufficient documentation

## 2013-05-24 DIAGNOSIS — Z1212 Encounter for screening for malignant neoplasm of rectum: Secondary | ICD-10-CM | POA: Diagnosis not present

## 2013-06-07 DIAGNOSIS — H902 Conductive hearing loss, unspecified: Secondary | ICD-10-CM | POA: Diagnosis not present

## 2013-06-07 DIAGNOSIS — H6500 Acute serous otitis media, unspecified ear: Secondary | ICD-10-CM | POA: Diagnosis not present

## 2013-07-29 DIAGNOSIS — Z23 Encounter for immunization: Secondary | ICD-10-CM | POA: Diagnosis not present

## 2013-07-30 ENCOUNTER — Other Ambulatory Visit (HOSPITAL_COMMUNITY): Payer: Self-pay | Admitting: Cardiology

## 2013-07-30 DIAGNOSIS — I2510 Atherosclerotic heart disease of native coronary artery without angina pectoris: Secondary | ICD-10-CM

## 2013-07-30 MED ORDER — CLOPIDOGREL BISULFATE 75 MG PO TABS: ORAL_TABLET | ORAL | Status: DC

## 2013-07-30 NOTE — Telephone Encounter (Signed)
Requested Prescriptions   Signed Prescriptions Disp Refills  . clopidogrel (PLAVIX) 75 MG tablet 30 tablet 5    Sig: TAKE ONE TABLET BY MOUTH ONCE DAILY    Authorizing Provider: Jolaine Artist    Ordering User: JEFFRIES, Sharlot Gowda

## 2013-08-12 DIAGNOSIS — C679 Malignant neoplasm of bladder, unspecified: Secondary | ICD-10-CM | POA: Diagnosis not present

## 2013-08-15 ENCOUNTER — Inpatient Hospital Stay (HOSPITAL_COMMUNITY)
Admission: EM | Admit: 2013-08-15 | Discharge: 2013-08-18 | DRG: 872 | Disposition: A | Discharge status: Home Health | Payer: Medicare Other | Attending: Internal Medicine | Admitting: Internal Medicine

## 2013-08-15 ENCOUNTER — Emergency Department (HOSPITAL_COMMUNITY): Payer: Medicare Other

## 2013-08-15 ENCOUNTER — Inpatient Hospital Stay (HOSPITAL_COMMUNITY): Payer: Medicare Other

## 2013-08-15 ENCOUNTER — Encounter (HOSPITAL_COMMUNITY): Payer: Self-pay | Admitting: Emergency Medicine

## 2013-08-15 DIAGNOSIS — IMO0002 Reserved for concepts with insufficient information to code with codable children: Secondary | ICD-10-CM | POA: Diagnosis present

## 2013-08-15 DIAGNOSIS — D649 Anemia, unspecified: Secondary | ICD-10-CM | POA: Diagnosis present

## 2013-08-15 DIAGNOSIS — R066 Hiccough: Secondary | ICD-10-CM | POA: Diagnosis present

## 2013-08-15 DIAGNOSIS — Z95810 Presence of automatic (implantable) cardiac defibrillator: Secondary | ICD-10-CM

## 2013-08-15 DIAGNOSIS — E039 Hypothyroidism, unspecified: Secondary | ICD-10-CM | POA: Diagnosis present

## 2013-08-15 DIAGNOSIS — R509 Fever, unspecified: Secondary | ICD-10-CM | POA: Diagnosis not present

## 2013-08-15 DIAGNOSIS — D6489 Other specified anemias: Secondary | ICD-10-CM | POA: Diagnosis present

## 2013-08-15 DIAGNOSIS — I959 Hypotension, unspecified: Secondary | ICD-10-CM | POA: Diagnosis not present

## 2013-08-15 DIAGNOSIS — N179 Acute kidney failure, unspecified: Secondary | ICD-10-CM | POA: Diagnosis not present

## 2013-08-15 DIAGNOSIS — R5381 Other malaise: Secondary | ICD-10-CM | POA: Diagnosis not present

## 2013-08-15 DIAGNOSIS — R1031 Right lower quadrant pain: Secondary | ICD-10-CM | POA: Diagnosis present

## 2013-08-15 DIAGNOSIS — Z823 Family history of stroke: Secondary | ICD-10-CM | POA: Diagnosis not present

## 2013-08-15 DIAGNOSIS — Z87891 Personal history of nicotine dependence: Secondary | ICD-10-CM

## 2013-08-15 DIAGNOSIS — A419 Sepsis, unspecified organism: Secondary | ICD-10-CM | POA: Diagnosis not present

## 2013-08-15 DIAGNOSIS — I509 Heart failure, unspecified: Secondary | ICD-10-CM | POA: Diagnosis present

## 2013-08-15 DIAGNOSIS — J9819 Other pulmonary collapse: Secondary | ICD-10-CM | POA: Diagnosis not present

## 2013-08-15 DIAGNOSIS — E785 Hyperlipidemia, unspecified: Secondary | ICD-10-CM | POA: Diagnosis present

## 2013-08-15 DIAGNOSIS — I255 Ischemic cardiomyopathy: Secondary | ICD-10-CM | POA: Diagnosis present

## 2013-08-15 DIAGNOSIS — I129 Hypertensive chronic kidney disease with stage 1 through stage 4 chronic kidney disease, or unspecified chronic kidney disease: Secondary | ICD-10-CM | POA: Diagnosis present

## 2013-08-15 DIAGNOSIS — M545 Low back pain, unspecified: Secondary | ICD-10-CM | POA: Diagnosis present

## 2013-08-15 DIAGNOSIS — R627 Adult failure to thrive: Secondary | ICD-10-CM | POA: Diagnosis present

## 2013-08-15 DIAGNOSIS — Z87440 Personal history of urinary (tract) infections: Secondary | ICD-10-CM

## 2013-08-15 DIAGNOSIS — D696 Thrombocytopenia, unspecified: Secondary | ICD-10-CM | POA: Diagnosis present

## 2013-08-15 DIAGNOSIS — I252 Old myocardial infarction: Secondary | ICD-10-CM | POA: Diagnosis not present

## 2013-08-15 DIAGNOSIS — Y849 Medical procedure, unspecified as the cause of abnormal reaction of the patient, or of later complication, without mention of misadventure at the time of the procedure: Secondary | ICD-10-CM | POA: Diagnosis present

## 2013-08-15 DIAGNOSIS — N183 Chronic kidney disease, stage 3 unspecified: Secondary | ICD-10-CM | POA: Diagnosis not present

## 2013-08-15 DIAGNOSIS — G4730 Sleep apnea, unspecified: Secondary | ICD-10-CM | POA: Diagnosis present

## 2013-08-15 DIAGNOSIS — N9989 Other postprocedural complications and disorders of genitourinary system: Secondary | ICD-10-CM | POA: Diagnosis present

## 2013-08-15 DIAGNOSIS — N189 Chronic kidney disease, unspecified: Secondary | ICD-10-CM | POA: Diagnosis present

## 2013-08-15 DIAGNOSIS — Z8249 Family history of ischemic heart disease and other diseases of the circulatory system: Secondary | ICD-10-CM | POA: Diagnosis not present

## 2013-08-15 DIAGNOSIS — I5022 Chronic systolic (congestive) heart failure: Secondary | ICD-10-CM | POA: Diagnosis present

## 2013-08-15 DIAGNOSIS — I4891 Unspecified atrial fibrillation: Secondary | ICD-10-CM | POA: Diagnosis present

## 2013-08-15 DIAGNOSIS — K219 Gastro-esophageal reflux disease without esophagitis: Secondary | ICD-10-CM | POA: Diagnosis present

## 2013-08-15 DIAGNOSIS — K449 Diaphragmatic hernia without obstruction or gangrene: Secondary | ICD-10-CM | POA: Diagnosis not present

## 2013-08-15 DIAGNOSIS — K2970 Gastritis, unspecified, without bleeding: Secondary | ICD-10-CM | POA: Diagnosis not present

## 2013-08-15 DIAGNOSIS — R110 Nausea: Secondary | ICD-10-CM | POA: Diagnosis not present

## 2013-08-15 DIAGNOSIS — C679 Malignant neoplasm of bladder, unspecified: Secondary | ICD-10-CM | POA: Diagnosis present

## 2013-08-15 DIAGNOSIS — Z9889 Other specified postprocedural states: Secondary | ICD-10-CM

## 2013-08-15 DIAGNOSIS — I2510 Atherosclerotic heart disease of native coronary artery without angina pectoris: Secondary | ICD-10-CM | POA: Diagnosis present

## 2013-08-15 DIAGNOSIS — N390 Urinary tract infection, site not specified: Secondary | ICD-10-CM | POA: Diagnosis present

## 2013-08-15 DIAGNOSIS — R059 Cough, unspecified: Secondary | ICD-10-CM | POA: Diagnosis present

## 2013-08-15 DIAGNOSIS — R112 Nausea with vomiting, unspecified: Secondary | ICD-10-CM | POA: Diagnosis not present

## 2013-08-15 DIAGNOSIS — I2589 Other forms of chronic ischemic heart disease: Secondary | ICD-10-CM | POA: Diagnosis present

## 2013-08-15 DIAGNOSIS — R05 Cough: Secondary | ICD-10-CM | POA: Diagnosis present

## 2013-08-15 DIAGNOSIS — I10 Essential (primary) hypertension: Secondary | ICD-10-CM | POA: Diagnosis present

## 2013-08-15 LAB — POCT I-STAT, CHEM 8
Calcium, Ion: 1.11 mmol/L — ABNORMAL LOW (ref 1.13–1.30)
Creatinine, Ser: 3.3 mg/dL — ABNORMAL HIGH (ref 0.50–1.35)
Glucose, Bld: 135 mg/dL — ABNORMAL HIGH (ref 70–99)
Hemoglobin: 10.5 g/dL — ABNORMAL LOW (ref 13.0–17.0)
Sodium: 137 mEq/L (ref 135–145)
TCO2: 22 mmol/L (ref 0–100)

## 2013-08-15 LAB — COMPREHENSIVE METABOLIC PANEL
ALT: 14 U/L (ref 0–53)
Albumin: 3.2 g/dL — ABNORMAL LOW (ref 3.5–5.2)
Alkaline Phosphatase: 46 U/L (ref 39–117)
Potassium: 4.1 mEq/L (ref 3.5–5.1)
Sodium: 131 mEq/L — ABNORMAL LOW (ref 135–145)
Total Protein: 6.7 g/dL (ref 6.0–8.3)

## 2013-08-15 LAB — CBC WITH DIFFERENTIAL/PLATELET
Basophils Absolute: 0 10*3/uL (ref 0.0–0.1)
Basophils Relative: 0 % (ref 0–1)
Eosinophils Absolute: 0 10*3/uL (ref 0.0–0.7)
MCH: 29.2 pg (ref 26.0–34.0)
MCHC: 31.9 g/dL (ref 30.0–36.0)
Neutrophils Relative %: 87 % — ABNORMAL HIGH (ref 43–77)
Platelets: 159 10*3/uL (ref 150–400)
RBC: 3.43 MIL/uL — ABNORMAL LOW (ref 4.22–5.81)

## 2013-08-15 LAB — URINALYSIS, ROUTINE W REFLEX MICROSCOPIC
Bilirubin Urine: NEGATIVE
Nitrite: NEGATIVE
Specific Gravity, Urine: 1.026 (ref 1.005–1.030)
pH: 5.5 (ref 5.0–8.0)

## 2013-08-15 LAB — URINE MICROSCOPIC-ADD ON

## 2013-08-15 MED ORDER — CARVEDILOL 6.25 MG PO TABS
6.2500 mg | ORAL_TABLET | Freq: Two times a day (BID) | ORAL | Status: DC
  Administered 2013-08-16 – 2013-08-18 (×5): 6.25 mg via ORAL
  Filled 2013-08-15 (×7): qty 1

## 2013-08-15 MED ORDER — SODIUM CHLORIDE 0.9 % IV SOLN
Freq: Once | INTRAVENOUS | Status: AC
  Administered 2013-08-15: 11:00:00 via INTRAVENOUS

## 2013-08-15 MED ORDER — CLOPIDOGREL BISULFATE 75 MG PO TABS
75.0000 mg | ORAL_TABLET | Freq: Every day | ORAL | Status: DC
  Administered 2013-08-16 – 2013-08-18 (×3): 75 mg via ORAL
  Filled 2013-08-15 (×4): qty 1

## 2013-08-15 MED ORDER — LEVOTHYROXINE SODIUM 50 MCG PO TABS
50.0000 ug | ORAL_TABLET | Freq: Every day | ORAL | Status: DC
  Administered 2013-08-16 – 2013-08-18 (×3): 50 ug via ORAL
  Filled 2013-08-15 (×4): qty 1

## 2013-08-15 MED ORDER — NITROGLYCERIN 0.4 MG SL SUBL: 0.4000 mg | SUBLINGUAL_TABLET | SUBLINGUAL | Status: DC | PRN

## 2013-08-15 MED ORDER — ENOXAPARIN SODIUM 30 MG/0.3ML ~~LOC~~ SOLN
30.0000 mg | SUBCUTANEOUS | Status: DC
  Administered 2013-08-15 – 2013-08-17 (×3): 30 mg via SUBCUTANEOUS
  Filled 2013-08-15 (×4): qty 0.3

## 2013-08-15 MED ORDER — ASPIRIN 325 MG PO TABS
325.0000 mg | ORAL_TABLET | Freq: Every day | ORAL | Status: DC
  Administered 2013-08-15 – 2013-08-18 (×4): 325 mg via ORAL
  Filled 2013-08-15 (×4): qty 1

## 2013-08-15 MED ORDER — LEVOFLOXACIN IN D5W 500 MG/100ML IV SOLN
500.0000 mg | INTRAVENOUS | Status: DC
  Administered 2013-08-17: 500 mg via INTRAVENOUS
  Filled 2013-08-15: qty 100

## 2013-08-15 MED ORDER — PANTOPRAZOLE SODIUM 40 MG PO TBEC
40.0000 mg | DELAYED_RELEASE_TABLET | Freq: Every day | ORAL | Status: DC
  Administered 2013-08-15 – 2013-08-18 (×4): 40 mg via ORAL
  Filled 2013-08-15 (×4): qty 1

## 2013-08-15 MED ORDER — ACETAMINOPHEN 325 MG PO TABS
650.0000 mg | ORAL_TABLET | Freq: Four times a day (QID) | ORAL | Status: DC | PRN
  Administered 2013-08-16 – 2013-08-18 (×2): 650 mg via ORAL
  Filled 2013-08-15 (×2): qty 2

## 2013-08-15 MED ORDER — SODIUM CHLORIDE 0.9 % IV SOLN
INTRAVENOUS | Status: DC
  Administered 2013-08-15 – 2013-08-18 (×5): via INTRAVENOUS

## 2013-08-15 MED ORDER — SODIUM CHLORIDE 0.9 % IV BOLUS (SEPSIS): 1000.0000 mL | Freq: Once | INTRAVENOUS | Status: DC

## 2013-08-15 MED ORDER — ONDANSETRON HCL 4 MG/2ML IJ SOLN
4.0000 mg | INTRAMUSCULAR | Status: DC | PRN
  Administered 2013-08-15: 4 mg via INTRAVENOUS
  Filled 2013-08-15: qty 2

## 2013-08-15 MED ORDER — MORPHINE SULFATE 2 MG/ML IJ SOLN
2.0000 mg | INTRAMUSCULAR | Status: DC | PRN
  Administered 2013-08-15: 2 mg via INTRAVENOUS
  Filled 2013-08-15: qty 1

## 2013-08-15 MED ORDER — ATORVASTATIN CALCIUM 10 MG PO TABS
10.0000 mg | ORAL_TABLET | Freq: Every day | ORAL | Status: DC
  Administered 2013-08-15 – 2013-08-17 (×3): 10 mg via ORAL
  Filled 2013-08-15 (×4): qty 1

## 2013-08-15 MED ORDER — ONDANSETRON HCL 4 MG/2ML IJ SOLN
4.0000 mg | Freq: Once | INTRAMUSCULAR | Status: AC
  Administered 2013-08-15: 4 mg via INTRAVENOUS
  Filled 2013-08-15: qty 2

## 2013-08-15 MED ORDER — IOHEXOL 300 MG/ML  SOLN
50.0000 mL | Freq: Once | INTRAMUSCULAR | Status: AC | PRN
  Administered 2013-08-15: 50 mL via ORAL

## 2013-08-15 MED ORDER — SODIUM CHLORIDE 0.9 % IV BOLUS (SEPSIS)
500.0000 mL | Freq: Once | INTRAVENOUS | Status: AC
  Administered 2013-08-15: 500 mL via INTRAVENOUS

## 2013-08-15 MED ORDER — DIGOXIN 0.0625 MG HALF TABLET
0.0625 mg | ORAL_TABLET | Freq: Every day | ORAL | Status: DC
  Administered 2013-08-15 – 2013-08-18 (×4): 0.0625 mg via ORAL
  Filled 2013-08-15 (×4): qty 1

## 2013-08-15 MED ORDER — AMIODARONE HCL 200 MG PO TABS
200.0000 mg | ORAL_TABLET | Freq: Every day | ORAL | Status: DC
  Administered 2013-08-15 – 2013-08-18 (×4): 200 mg via ORAL
  Filled 2013-08-15 (×4): qty 1

## 2013-08-15 MED ORDER — DEXTROSE 5 % IV SOLN
1.0000 g | Freq: Once | INTRAVENOUS | Status: AC
  Administered 2013-08-15: 1 g via INTRAVENOUS
  Filled 2013-08-15: qty 1

## 2013-08-15 MED ORDER — ACETAMINOPHEN 650 MG RE SUPP: 650.0000 mg | Freq: Four times a day (QID) | RECTAL | Status: DC | PRN

## 2013-08-15 MED ORDER — LEVOFLOXACIN IN D5W 750 MG/150ML IV SOLN
750.0000 mg | Freq: Once | INTRAVENOUS | Status: AC
  Administered 2013-08-15: 750 mg via INTRAVENOUS
  Filled 2013-08-15: qty 150

## 2013-08-15 MED ORDER — SODIUM CHLORIDE 0.9 % IV SOLN
Freq: Once | INTRAVENOUS | Status: AC
  Administered 2013-08-15: 12:00:00 via INTRAVENOUS

## 2013-08-15 MED ORDER — SODIUM CHLORIDE 0.9 % IV SOLN: INTRAVENOUS | Status: DC

## 2013-08-15 MED ORDER — DEXTROSE 5 % IV SOLN
1.0000 g | Freq: Three times a day (TID) | INTRAVENOUS | Status: DC
  Administered 2013-08-15 – 2013-08-18 (×8): 1 g via INTRAVENOUS
  Filled 2013-08-15 (×9): qty 1

## 2013-08-15 NOTE — ED Notes (Signed)
Report given to Isaias Sakai, RN on floor

## 2013-08-15 NOTE — H&P (Signed)
PCP:   [name], MD   Chief Complaint:  N/v/fever  HPI: 79 yo with cad, htn, bladder ca to er with fever, n/v. Underwent cysto Thursday and sick since.  Called last pm, declined er, started on home zofran and cipro.  Symptoms continued and presented to er with continued sxs. He denies cp, sob, cough or sputum.  He denies blood, diarrhea, abdominal pain.  He does relate low back pain, dysuria.  Urology contacted by ED and despite seeming complication from cystoscopy, declined to admit.   Past Medical History: Past Medical History  Diagnosis Date  . CAD (coronary artery disease)      a. s/p anterior MI 12/05 c/b shock -> stent LAD   b. s/p stenting OM-1, 2/06  . CHF (congestive heart failure)     due to ischemic CM  a. EF 20-30%. (Nov 2008)   b. s/p St. Jude BiV-ICD    c. CPX 07/2008  pvo2 16.3 (63% predicted) slope 34 RER 1.08 O2 pulse 93%  . Hyperlipidemia   . Hypothyroidism   . GERD (gastroesophageal reflux disease)     hx Barretts esophagitis  . Anemia   . 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 Myocardial infarction 09/15/2004  . History of blood transfusion     following coumadin  usage  . Cough     occasional /productive, no fever/ not new  . ICD (implantable cardiac defibrillator) in place     BiV/ICD [address], Model [id number], Serial # [id number], implanted 01/29/08  . Skin cancer     basal cells   facial x 4, 1 right forearm  . Bladder cancer dx'd 05/2012  . HTN (hypertension)     EKG 05/14/12,Chest x ray 8/13, last ICD interrogation 8/13 EPIC  . Atrial fibrillation or flutter     maintaining sinus on amiodarone    . Sleep apnea     STOP BANG SCORE 4  . Pacemaker   . CRI (chronic renal insufficiency)     (baseline 2.0-2.2)/ recent hospitalization 8/13   Past Surgical History  Procedure Laterality Date  . Cervical laminectomy  1985  . Back surgery  1972    lower back  . Cardiac defibrillator placement      ICD St Jude  .  Hernia repair  1989    bilateral  . Esophagogastroduodenoscopy    . Colonoscopy    . Coronary angioplasty  [id number]  . Transurethral resection of bladder tumor  05/25/2012    cold cup biopsy prostate  . Cystoscopy w/ retrogrades  05/25/2012    Procedure: CYSTOSCOPY WITH RETROGRADE PYELOGRAM;  Surgeon: Molli Hazard, MD;  Location: [facility];  Service: Urology;  Laterality: Bilateral;  . Transurethral resection of bladder tumor  07/08/2012    Procedure: TRANSURETHRAL RESECTION OF BLADDER TUMOR (TURBT);  Surgeon: Molli Hazard, MD;  Location: [facility];  Service: Urology;  Laterality: N/A;  CYSTO, TURBT W/ GYRUS, BILATERAL STENT REMOVAL, LEFT URETEROSCOPY WITH BIOPSY, POSSIBLE LEFT STENT PLACEMENT    . Cystoscopy w/ ureteral stent removal  07/08/2012    Procedure: CYSTOSCOPY WITH STENT REMOVAL;  Surgeon: Molli Hazard, MD;  Location: [facility];  Service: Urology;  Laterality: Bilateral;  . Cystoscopy w/ ureteral stent placement  07/08/2012    Procedure: CYSTOSCOPY WITH STENT REPLACEMENT;  Surgeon: Molli Hazard, MD;  Location: [facility];  Service: Urology;  Laterality: Left;  . Cystoscopy/retrograde/ureteroscopy  07/08/2012    Procedure:  CYSTOSCOPY/RETROGRADE/URETEROSCOPY;  Surgeon: Molli Hazard, MD;  Location: [facility];  Service: Urology;  Laterality: Left;    Medications: Prior to Admission medications   Medication Sig Start Date End Date Taking? Authorizing Provider  aspirin 325 MG tablet Take 325 mg by mouth daily.   Yes Historical Provider, MD  carvedilol (COREG) 6.25 MG tablet TAKE ONE TABLET BY MOUTH TWICE DAILY WITH MEALS 12/29/12  Yes Jolaine Artist, MD  ciprofloxacin (CIPRO) 500 MG tablet Take 500 mg by mouth 2 (two) times daily. 08/14/13  Yes Historical Provider, MD  clopidogrel (PLAVIX) 75 MG tablet TAKE ONE TABLET BY MOUTH ONCE DAILY 07/30/13  Yes Jolaine Artist, MD  digoxin (LANOXIN) 0.125 MG tablet Take 0.5 tablets (0.0625 mg total) by mouth daily.  03/30/13  Yes Jolaine Artist, MD  ferrous sulfate ([employer]S IRON) 325 (65 FE) MG tablet Take 325 mg by mouth 3 (three) times a week. 3 times a week   Yes Historical Provider, MD  furosemide (LASIX) 20 MG tablet Take 1 tablet (20 mg total) by mouth 3 (three) times a week. 03/30/13  Yes Jolaine Artist, MD  levothyroxine (SYNTHROID, LEVOTHROID) 50 MCG tablet Take 50 mcg by mouth daily before breakfast.    Yes Historical Provider, MD  Multiple Vitamins-Minerals (CENTRUM SILVER PO) Take 0.5 tablets by mouth 2 (two) times daily.   Yes Historical Provider, MD  neomycin-bacitracin-polymyxin (NEOSPORIN) [phone] ointment Apply 1 application topically 4 (four) times daily. Apply to cath insertion   Yes Historical Provider, MD  nitroGLYCERIN (NITROSTAT) 0.4 MG SL tablet Place 1 tablet (0.4 mg total) under the tongue every 5 (five) minutes as needed. For chest pain 04/21/13  Yes Jolaine Artist, MD  PACERONE 200 MG tablet TAKE ONE-HALF TABLET BY MOUTH EVERY DAY 11/19/12  Yes Jolaine Artist, MD  pantoprazole (PROTONIX) 40 MG tablet Take 1 tablet by mouth daily.  01/15/11  Yes Historical Provider, MD  potassium chloride (K-DUR) 10 MEQ tablet Take 10 mEq by mouth daily with breakfast.    Yes Historical Provider, MD  rosuvastatin (CRESTOR) 20 MG tablet TAKE ONE TABLET BY MOUTH EVERY DAY WITH  SUPPER 04/27/13  Yes Shaune Pascal Bensimhon, MD  senna-docusate (SENOKOT-S) 8.6-50 MG per tablet Take 1 tablet by mouth as needed. 07/09/12  Yes Molli Hazard, MD  valsartan (DIOVAN) 80 MG tablet Take 40 mg by mouth daily.   Yes Historical Provider, MD  ZETIA 10 MG tablet TAKE ONE TABLET BY MOUTH EVERY DAY WITH SUPPER 11/19/12  Yes Jolaine Artist, MD    Allergies:   Allergies  Allergen Reactions  . Ramipril Other (See Comments)    cough  . Amoxicillin Rash  . Penicillins Rash    Social History:  reports that he quit smoking about 9 years ago. His smoking use included Cigarettes and Cigars. He smoked 0.00  packs per day. He quit smokeless tobacco use about 14 years ago. He reports that he does not drink alcohol or use illicit drugs.  Family History: Family History  Problem Relation Age of Onset  . Coronary artery disease    . Stroke      Physical Exam: Filed Vitals:   08/15/13 1415 08/15/13 1430 08/15/13 1516 08/15/13 1600  BP: 103/48 100/42 112/43 150/59  Pulse: 66 65 72 74  Temp:   98.5 F (36.9 C) 98.4 F (36.9 C)  TempSrc:   Oral Oral  Resp: 24 18 24 23   Height:   5\' 10"  (1.778 m)  Weight:   84.5 kg (186 lb 4.6 oz)   SpO2: 99%  100% 93%   General appearance: alert, cooperative and mild distress Head: Normocephalic, without obvious abnormality, atraumatic Eyes: conjunctivae/corneas clear. PERRL, EOM's intact.  Nose: Nares normal. Septum midline. Mucosa normal. No drainage or sinus tenderness. Throat: lips, mucosa, and tongue normal; teeth and gums normal Neck: no adenopathy, no carotid bruit, no JVD and thyroid not enlarged, symmetric, no tenderness/mass/nodules Resp: clear to auscultation bilaterally Cardio: regular rate and rhythm GI: soft, mild suprapubic tendernessr; bowel sounds normal; no masses,  no organomegaly Extremities: extremities normal, atraumatic, no cyanosis or edema Pulses: 2+ and symmetric Lymph nodes: Cervical adenopathy: no cervical lymphadenopathy Neurologic: Alert and oriented X 3, normal strength and tone. Normal symmetric reflexes.     Labs on Admission:   Recent Labs  08/15/13 1051  NA 131*  137  K 4.1  4.3  CL 98  106  CO2 22  GLUCOSE 140*  135*  BUN 37*  46*  CREATININE 2.73*  3.30*  CALCIUM 8.7    Recent Labs  08/15/13 1051  AST 39*  ALT 14  ALKPHOS 46  BILITOT 0.5  PROT 6.7  ALBUMIN 3.2*    Recent Labs  08/15/13 1051  LIPASE 21    Recent Labs  08/15/13 1051  WBC 12.3*  NEUTROABS 10.8*  HGB 10.0*  10.5*  HCT 31.3*  31.0*  MCV 91.3  PLT 159   No results found for this basename: CKTOTAL, CKMB,  CKMBINDEX, TROPONINI,  in the last 72 hours No results found for this basename: TSH, T4TOTAL, FREET3, T3FREE, THYROIDAB,  in the last 72 hours No results found for this basename: VITAMINB12, FOLATE, FERRITIN, TIBC, IRON, RETICCTPCT,  in the last 72 hours  Radiological Exams on Admission: Ct Abdomen Pelvis Wo Contrast  08/15/2013   CLINICAL DATA:  History of stroke. History of urinary tract infection. Question stones. Urosepsis.  EXAM: CT ABDOMEN AND PELVIS WITHOUT CONTRAST  TECHNIQUE: Multidetector CT imaging of the abdomen and pelvis was performed following the standard protocol without intravenous contrast.  COMPARISON:  CT of the abdomen and pelvis 05/27/2012  FINDINGS: There is scarring at both lung bases. The patient has AICD, with leads to the right ventricle and coronary sinus. Heart is enlarged.  No focal abnormality identified within the liver, spleen, pancreas, or adrenal glands. The gallbladder is present. Note is made of a hiatal hernia. No intrarenal or ureteral stones are identified. No evidence for renal abscess. The bladder is notable for several cellules. The prostate contains several calcifications.  Small bowel loops have a normal appearance. There are numerous colonic diverticula, especially involving the distal colon. However, no acute diverticulitis identified. The appendix is well seen and has a normal appearance.  There is atherosclerotic calcification of the abdominal aorta and without aneurysm. No retroperitoneal or mesenteric adenopathy. Significant degenerative changes are identified in the lumbar spine. No suspicious lytic or blastic lesions are noted.  IMPRESSION: 1. Cardiomegaly. 2. Hiatal hernia. 3. No intrarenal or ureteral stones. 4. Bladder cellules, consistent with increased bladder pressure and raising the question of bladder outlet obstruction. 5. Colonic diverticulosis. 6. No evidence for acute diverticulitis. 7. Atherosclerotic abdominal aorta.   Electronically Signed    By: Shon Hale M.D.   On: 08/15/2013 11:51   Dg Chest Portable 1 View  08/15/2013   CLINICAL DATA:  Initial encounter for fever, nausea, and hypotension. Prior history of resection of bladder tumor.  EXAM: PORTABLE CHEST - 1 VIEW  COMPARISON:  Two-view chest x-rays 02/18/2013, 05/19/2012, 07/03/2011, 05/17/2010.  FINDINGS: Suboptimal inspiration accounts for crowded bronchovascular markings, especially in the bases, and accentuates the cardiac silhouette. Cardiac silhouette moderately enlarged even allowing for the AP portable technique and degree of inspiration. Mild pulmonary venous hypertension without overt edema. Lungs clear. No visible pleural effusions. No pneumothorax. Stable biapical pleuroparenchymal scarring. Left subclavian biventricular pacing defibrillator unchanged and appears intact.  IMPRESSION: 1. Suboptimal inspiration.  No acute cardiopulmonary disease. 2. Stable cardiomegaly without pulmonary edema.   Electronically Signed   By: Evangeline Dakin M.D.   On: 08/15/2013 13:36   Orders placed in visit on 12/15/12  . EKG 12-LEAD    Assessment/Plan 1. Sepsis 2. UTI post cstoscopy 3. CAD- stable, no evidence chf  Admit, culture, ivf, iv abx*   Addalyn Speedy A 08/15/2013, 5:55 PM

## 2013-08-15 NOTE — ED Notes (Signed)
Pt to CT

## 2013-08-15 NOTE — Progress Notes (Signed)
ANTIBIOTIC CONSULT NOTE - INITIAL  Pharmacy Consult for Levaquin, Aztreonam Indication: Urosepsis  Allergies  Allergen Reactions  . Ramipril Other (See Comments)    cough  . Amoxicillin Rash  . Penicillins Rash    Patient Measurements: Height: 5\' 10"  (177.8 cm) Weight: 180 lb (81.647 kg) IBW/kg (Calculated) : 73 Adjusted Body Weight:   Vital Signs: Temp: 99.5 F (37.5 C) (11/02 1040) Temp src: Oral (11/02 1040) BP: 88/41 mmHg (11/02 1132) Pulse Rate: 66 (11/02 1132) Intake/Output from previous day:   Intake/Output from this shift:    Labs:  Recent Labs  08/15/13 1051  WBC 12.3*  HGB 10.0*  10.5*  PLT 159  CREATININE 2.73*  3.30*   Estimated Creatinine Clearance: 22.7 ml/min (by C-G formula based on Cr of 2.73). No results found for this basename: VANCOTROUGH, VANCOPEAK, VANCORANDOM, GENTTROUGH, GENTPEAK, GENTRANDOM, TOBRATROUGH, TOBRAPEAK, TOBRARND, AMIKACINPEAK, AMIKACINTROU, AMIKACIN,  in the last 72 hours   Microbiology: No results found for this or any previous visit (from the past 720 hour(s)).  Medical History: Past Medical History  Diagnosis Date  . CAD (coronary artery disease)      a. s/p anterior MI 12/05 c/b shock -> stent LAD   b. s/p stenting OM-1, 2/06  . CHF (congestive heart failure)     due to ischemic CM  a. EF 20-30%. (Nov 2008)   b. s/p St. Jude BiV-ICD    c. CPX 07/2008  pvo2 16.3 (63% predicted) slope 34 RER 1.08 O2 pulse 93%  . Hyperlipidemia   . Hypothyroidism   . GERD (gastroesophageal reflux disease)     hx Barretts esophagitis  . Anemia   . 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 Myocardial infarction 09/15/2004  . History of blood transfusion     following coumadin  usage  . Cough     occasional /productive, no fever/ not new  . ICD (implantable cardiac defibrillator) in place     BiV/ICD  . Skin cancer     basal cells   facial x 4, 1 right forearm  . Bladder cancer dx'd 05/2012   . HTN (hypertension)     EKG 05/14/12,Chest x ray 8/13, last ICD interrogation 8/13 EPIC  . Atrial fibrillation or flutter     maintaining sinus on amiodarone    . Sleep apnea     STOP BANG SCORE 4  . Pacemaker   . CRI (chronic renal insufficiency)     (baseline 2.0-2.2)/ recent hospitalization 8/13   Assessment:  65 yoM with PMHx bladder cancer with recurrent UTIs and CKD presents with fever and abdominal pain.  Pt had cystoscopy 3 days and was started on ciprofloxacin for possible UTI.  Pharmacy consulted to dose levaquin and aztreonam for urosepsis.  Pt has allergies to amoxicillin/penicillins with reaction of rash.    Antibiotics:  Outpatient ciprofloxacin 11/2 >> levaquin  >> 11/2 >> aztreonam >>    Tmax: 99.5 WBCs:12.3 Renal:SCr 3.30, CrCl ~19 (N=22)  Microbiology: 11/2 blood: Collected 11/2 urine: Collected  Goal of Therapy:  Eradication of Infection  Plan:  1.  Levaquin 750mg  IV x 1, then 500mg  IV q 48 hours 2.  Aztreonam 1gm IV q 8 h 3.  F/u cultures, renal function, clinical course  Ralene Bathe, PharmD 08/15/2013, 11:55 AM  Pager: [phone]

## 2013-08-15 NOTE — ED Notes (Addendum)
Per ems pt is from home. C/o back pain started 1 week ago, improved, yesterday N/V and abdominal pain started. Unable to keep most food down.   Temp 101.1 given 1000 mg tylenol PO.  cbg 114. Hx of bladder cancer.   Hx of afib, MI, bladder cancer. Pt responds very slowly to questions, generally not a quick responder. Hx of familial tremors. Hx of UTI, being treated with Cipro currently.

## 2013-08-15 NOTE — ED Provider Notes (Signed)
CSN: [id number]     Arrival date & time 08/15/13  [id number] History   First MD Initiated Contact with Patient 08/15/13 1020     Chief Complaint  Patient presents with  . Fever  . Abdominal Pain  . Nausea   (Consider location/radiation/quality/duration/timing/severity/associated sxs/prior Treatment) HPI Comments: 79 year old male returns with fever and intermittent abdominal pain since last night. He points to the right lower quadrant as the source of his abdominal pain. He had a cystoscopy done 3 days ago by Dr. [name]. He has a long history of bladder cancer there were multiple treatments. He is currently on Cipro for possible UTI by his PCP. Has been having some vomiting as well. States he's had some dysuria during this time but denies hematuria. He has had multiple UTIs in the past.   Past Medical History  Diagnosis Date  . CAD (coronary artery disease)      a. s/p anterior MI 12/05 c/b shock -> stent LAD   b. s/p stenting OM-1, 2/06  . CHF (congestive heart failure)     due to ischemic CM  a. EF 20-30%. (Nov 2008)   b. s/p St. Jude BiV-ICD    c. CPX 07/2008  pvo2 16.3 (63% predicted) slope 34 RER 1.08 O2 pulse 93%  . Hyperlipidemia   . Hypothyroidism   . GERD (gastroesophageal reflux disease)     hx Barretts esophagitis  . Anemia   . 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 Myocardial infarction 09/15/2004  . History of blood transfusion     following coumadin  usage  . Cough     occasional /productive, no fever/ not new  . ICD (implantable cardiac defibrillator) in place     BiV/ICD  . Skin cancer     basal cells   facial x 4, 1 right forearm  . Bladder cancer dx'd 05/2012  . HTN (hypertension)     EKG 05/14/12,Chest x ray 8/13, last ICD interrogation 8/13 EPIC  . Atrial fibrillation or flutter     maintaining sinus on amiodarone    . Sleep apnea     STOP BANG SCORE 4  . Pacemaker   . CRI (chronic renal insufficiency)     (baseline  2.0-2.2)/ recent hospitalization 8/13   Past Surgical History  Procedure Laterality Date  . Cervical laminectomy  1985  . Back surgery  1972    lower back  . Cardiac defibrillator placement      ICD St Jude  . Hernia repair  1989    bilateral  . Esophagogastroduodenoscopy    . Colonoscopy    . Coronary angioplasty  [id number]  . Transurethral resection of bladder tumor  05/25/2012    cold cup biopsy prostate  . Cystoscopy w/ retrogrades  05/25/2012    Procedure: CYSTOSCOPY WITH RETROGRADE PYELOGRAM;  Surgeon: Molli Hazard, MD;  Location: [facility];  Service: Urology;  Laterality: Bilateral;  . Transurethral resection of bladder tumor  07/08/2012    Procedure: TRANSURETHRAL RESECTION OF BLADDER TUMOR (TURBT);  Surgeon: Molli Hazard, MD;  Location: [facility];  Service: Urology;  Laterality: N/A;  CYSTO, TURBT W/ GYRUS, BILATERAL STENT REMOVAL, LEFT URETEROSCOPY WITH BIOPSY, POSSIBLE LEFT STENT PLACEMENT    . Cystoscopy w/ ureteral stent removal  07/08/2012    Procedure: CYSTOSCOPY WITH STENT REMOVAL;  Surgeon: Molli Hazard, MD;  Location: [facility];  Service: Urology;  Laterality: Bilateral;  . Cystoscopy w/ ureteral stent  placement  07/08/2012    Procedure: CYSTOSCOPY WITH STENT REPLACEMENT;  Surgeon: Molli Hazard, MD;  Location: [facility];  Service: Urology;  Laterality: Left;  . Cystoscopy/retrograde/ureteroscopy  07/08/2012    Procedure: CYSTOSCOPY/RETROGRADE/URETEROSCOPY;  Surgeon: Molli Hazard, MD;  Location: [facility];  Service: Urology;  Laterality: Left;   Family History  Problem Relation Age of Onset  . Coronary artery disease    . Stroke     History  Substance Use Topics  . Smoking status: Former Smoker    Types: Cigarettes, Cigars    Quit date: 03/02/2004  . Smokeless tobacco: Former Systems developer    Quit date: 05/20/1999     Comment: quit in 2005  . Alcohol Use: No    Review of Systems  Constitutional: Positive for fever.  Gastrointestinal:  Positive for vomiting and abdominal pain. Negative for diarrhea.  Genitourinary: Positive for dysuria and frequency. Negative for hematuria.  Musculoskeletal: Positive for back pain.  All other systems reviewed and are negative.    Allergies  Ramipril; Amoxicillin; and Penicillins  Home Medications   Current Outpatient Rx  Name  Route  Sig  Dispense  Refill  . aspirin 325 MG tablet   Oral   Take 325 mg by mouth daily.         . carvedilol (COREG) 6.25 MG tablet      TAKE ONE TABLET BY MOUTH TWICE DAILY WITH MEALS   60 tablet   12   . ciprofloxacin (CIPRO) 500 MG tablet   Oral   Take 500 mg by mouth 2 (two) times daily.         . clopidogrel (PLAVIX) 75 MG tablet      TAKE ONE TABLET BY MOUTH ONCE DAILY   30 tablet   5   . digoxin (LANOXIN) 0.125 MG tablet   Oral   Take 0.5 tablets (0.0625 mg total) by mouth daily.   15 tablet   5   . DIOVAN 80 MG tablet      TAKE ONE-HALF TABLET (40 MG)  BY MOUTH TWICE DAILY   30 tablet   2   . ferrous sulfate ([employer] IRON) 325 (65 FE) MG tablet   Oral   Take 325 mg by mouth 3 (three) times a week. 3 times a week         . furosemide (LASIX) 20 MG tablet   Oral   Take 1 tablet (20 mg total) by mouth 3 (three) times a week.   30 tablet   6   . levothyroxine (SYNTHROID, LEVOTHROID) 50 MCG tablet   Oral   Take 50 mcg by mouth daily before breakfast.          . Multiple Vitamins-Minerals (CENTRUM SILVER PO)   Oral   Take 0.5 tablets by mouth 2 (two) times daily.         Marland Kitchen neomycin-bacitracin-polymyxin (NEOSPORIN) [phone] ointment   Topical   Apply 1 application topically 4 (four) times daily. Apply to cath insertion         . nitroGLYCERIN (NITROSTAT) 0.4 MG SL tablet   Sublingual   Place 1 tablet (0.4 mg total) under the tongue every 5 (five) minutes as needed. For chest pain   25 tablet   2   . PACERONE 200 MG tablet      TAKE ONE-HALF TABLET BY MOUTH EVERY DAY   45 tablet   3   . pantoprazole  (PROTONIX) 40 MG tablet   Oral  Take 1 tablet by mouth every evening.          . potassium chloride (K-DUR) 10 MEQ tablet   Oral   Take 10 mEq by mouth daily with breakfast.          . rosuvastatin (CRESTOR) 20 MG tablet      TAKE ONE TABLET BY MOUTH EVERY DAY WITH  SUPPER   30 tablet   5   . senna-docusate (SENOKOT-S) 8.6-50 MG per tablet   Oral   Take 1 tablet by mouth as needed.         . valsartan (DIOVAN) 80 MG tablet   Oral   Take 40 mg by mouth daily.          Marland Kitchen ZETIA 10 MG tablet      TAKE ONE TABLET BY MOUTH EVERY DAY WITH SUPPER   30 tablet   12    BP 95/31  Pulse 65  Temp(Src) 99.5 F (37.5 C) (Oral)  Resp 20  SpO2 93% Physical Exam  Nursing note and vitals reviewed. Constitutional: He is oriented to person, place, and time. He appears well-developed and well-nourished.  HENT:  Head: Normocephalic and atraumatic.  Right Ear: External ear normal.  Left Ear: External ear normal.  Nose: Nose normal.  Eyes: Right eye exhibits no discharge. Left eye exhibits no discharge.  Neck: Neck supple.  Cardiovascular: Normal rate, regular rhythm, normal heart sounds and intact distal pulses.   Pulmonary/Chest: Effort normal.  Abdominal: Soft. There is tenderness in the right lower quadrant.  Musculoskeletal:       Lumbar back: He exhibits tenderness.  Neurological: He is alert and oriented to person, place, and time.  Skin: Skin is warm. He is diaphoretic.    ED Course  Procedures (including critical care time) Labs Review Labs Reviewed  CBC WITH DIFFERENTIAL - Abnormal; Notable for the following:    WBC 12.3 (*)    RBC 3.43 (*)    Hemoglobin 10.0 (*)    HCT 31.3 (*)    Neutrophils Relative % 87 (*)    Neutro Abs 10.8 (*)    Lymphocytes Relative 6 (*)    All other components within normal limits  COMPREHENSIVE METABOLIC PANEL - Abnormal; Notable for the following:    Sodium 131 (*)    Glucose, Bld 140 (*)    BUN 37 (*)    Creatinine, Ser  2.73 (*)    Albumin 3.2 (*)    AST 39 (*)    GFR calc non Af Amer 21 (*)    GFR calc Af Amer 24 (*)    All other components within normal limits  URINALYSIS, ROUTINE W REFLEX MICROSCOPIC - Abnormal; Notable for the following:    APPearance CLOUDY (*)    Hgb urine dipstick LARGE (*)    Protein, ur 100 (*)    Leukocytes, UA SMALL (*)    All other components within normal limits  URINE MICROSCOPIC-ADD ON - Abnormal; Notable for the following:    Bacteria, UA MANY (*)    All other components within normal limits  POCT I-STAT, CHEM 8 - Abnormal; Notable for the following:    BUN 46 (*)    Creatinine, Ser 3.30 (*)    Glucose, Bld 135 (*)    Calcium, Ion 1.11 (*)    Hemoglobin 10.5 (*)    HCT 31.0 (*)    All other components within normal limits  CG4 I-STAT (LACTIC ACID) - Abnormal; Notable for the following:  Lactic Acid, Venous 2.46 (*)    All other components within normal limits  URINE CULTURE  CULTURE, BLOOD (ROUTINE X 2)  CULTURE, BLOOD (ROUTINE X 2)  LIPASE, BLOOD   Imaging Review Ct Abdomen Pelvis Wo Contrast  08/15/2013   CLINICAL DATA:  History of stroke. History of urinary tract infection. Question stones. Urosepsis.  EXAM: CT ABDOMEN AND PELVIS WITHOUT CONTRAST  TECHNIQUE: Multidetector CT imaging of the abdomen and pelvis was performed following the standard protocol without intravenous contrast.  COMPARISON:  CT of the abdomen and pelvis 05/27/2012  FINDINGS: There is scarring at both lung bases. The patient has AICD, with leads to the right ventricle and coronary sinus. Heart is enlarged.  No focal abnormality identified within the liver, spleen, pancreas, or adrenal glands. The gallbladder is present. Note is made of a hiatal hernia. No intrarenal or ureteral stones are identified. No evidence for renal abscess. The bladder is notable for several cellules. The prostate contains several calcifications.  Small bowel loops have a normal appearance. There are numerous colonic  diverticula, especially involving the distal colon. However, no acute diverticulitis identified. The appendix is well seen and has a normal appearance.  There is atherosclerotic calcification of the abdominal aorta and without aneurysm. No retroperitoneal or mesenteric adenopathy. Significant degenerative changes are identified in the lumbar spine. No suspicious lytic or blastic lesions are noted.  IMPRESSION: 1. Cardiomegaly. 2. Hiatal hernia. 3. No intrarenal or ureteral stones. 4. Bladder cellules, consistent with increased bladder pressure and raising the question of bladder outlet obstruction. 5. Colonic diverticulosis. 6. No evidence for acute diverticulitis. 7. Atherosclerotic abdominal aorta.   Electronically Signed   By: Shon Hale M.D.   On: 08/15/2013 11:51     MDM   1. Sepsis   2. Acute renal failure   3. UTI (urinary tract infection)    Patient with signs/sx of sepsis. Given his recent cystoscopy, will cover for hospital acquired UTI. Given small fluid boluses to help his borderline BP. No respiratory sx while in ED to suggest heart failure component. D/w Dr. Reynaldo Minium, will admit to stepdown. His mental status remained well in ED.    Ephraim Hamburger, MD 08/15/13 [phone]

## 2013-08-15 NOTE — ED Notes (Signed)
Pt has received 1 L NS, now has 2 NS bags hanging, going in 43ml/hr for both. Pt getting 571ml bolus from one bag.   Pt was cleaned up from bowel movement, but continues to have small bowel movements and cannot control bowels.

## 2013-08-16 ENCOUNTER — Inpatient Hospital Stay (HOSPITAL_COMMUNITY): Payer: Medicare Other

## 2013-08-16 DIAGNOSIS — I2510 Atherosclerotic heart disease of native coronary artery without angina pectoris: Secondary | ICD-10-CM | POA: Diagnosis not present

## 2013-08-16 DIAGNOSIS — J9819 Other pulmonary collapse: Secondary | ICD-10-CM | POA: Diagnosis not present

## 2013-08-16 DIAGNOSIS — I959 Hypotension, unspecified: Secondary | ICD-10-CM | POA: Diagnosis not present

## 2013-08-16 DIAGNOSIS — D649 Anemia, unspecified: Secondary | ICD-10-CM | POA: Diagnosis not present

## 2013-08-16 DIAGNOSIS — A419 Sepsis, unspecified organism: Secondary | ICD-10-CM | POA: Diagnosis present

## 2013-08-16 LAB — CBC
HCT: 29.1 % — ABNORMAL LOW (ref 39.0–52.0)
Hemoglobin: 9.3 g/dL — ABNORMAL LOW (ref 13.0–17.0)
MCH: 29.3 pg (ref 26.0–34.0)
MCHC: 32 g/dL (ref 30.0–36.0)
MCV: 91.8 fL (ref 78.0–100.0)
RBC: 3.17 MIL/uL — ABNORMAL LOW (ref 4.22–5.81)

## 2013-08-16 LAB — BASIC METABOLIC PANEL
BUN: 36 mg/dL — ABNORMAL HIGH (ref 6–23)
CO2: 21 mEq/L (ref 19–32)
Calcium: 7.8 mg/dL — ABNORMAL LOW (ref 8.4–10.5)
Glucose, Bld: 103 mg/dL — ABNORMAL HIGH (ref 70–99)
Potassium: 4.3 mEq/L (ref 3.5–5.1)
Sodium: 136 mEq/L (ref 135–145)

## 2013-08-16 LAB — URINE CULTURE
Colony Count: NO GROWTH
Culture: NO GROWTH

## 2013-08-16 MED ORDER — IPRATROPIUM BROMIDE 0.02 % IN SOLN
0.5000 mg | Freq: Once | RESPIRATORY_TRACT | Status: AC
  Administered 2013-08-16: 0.5 mg via RESPIRATORY_TRACT
  Filled 2013-08-16: qty 2.5

## 2013-08-16 MED ORDER — FUROSEMIDE 10 MG/ML IJ SOLN
20.0000 mg | Freq: Once | INTRAMUSCULAR | Status: AC
Start: 2013-08-16 — End: 2013-08-16
  Administered 2013-08-16: 20 mg via INTRAVENOUS

## 2013-08-16 MED ORDER — SODIUM CHLORIDE 0.9 % IV SOLN
12.5000 mg | Freq: Once | INTRAVENOUS | Status: AC
  Administered 2013-08-16: 12.5 mg via INTRAVENOUS
  Filled 2013-08-16: qty 0.5

## 2013-08-16 MED ORDER — FUROSEMIDE 10 MG/ML IJ SOLN
INTRAMUSCULAR | Status: AC
  Administered 2013-08-16: 20 mg via INTRAVENOUS
  Filled 2013-08-16: qty 2

## 2013-08-16 MED ORDER — CHLORPROMAZINE HCL 25 MG PO TABS
12.5000 mg | ORAL_TABLET | Freq: Once | ORAL | Status: AC
  Administered 2013-08-16: 12.5 mg via ORAL
  Filled 2013-08-16: qty 1

## 2013-08-16 MED ORDER — SODIUM CHLORIDE 0.9 % IV BOLUS (SEPSIS)
250.0000 mL | INTRAVENOUS | Status: DC | PRN
  Administered 2013-08-16: 250 mL via INTRAVENOUS

## 2013-08-16 NOTE — Progress Notes (Signed)
[id number] Rosana Hoes, RN, BSN, [place] [phone] Chart Reviewed for discharge and hospital needs. Discharge needs at time of review:  None Review of patient progress due on [id number].

## 2013-08-16 NOTE — Progress Notes (Signed)
Called c SOB following PT requiring 4L [place]. He has had lots of IVF. IVF turned down from 40 - 10 cc/hr. CXR ordered. Atrovent neb Ordered.

## 2013-08-16 NOTE — Progress Notes (Signed)
[facility] Progress Note Patient Name: [name] DOB: 12-Apr-1932 MRN: [id number]  Date of Service  08/16/2013   HPI/Events of Note  Intractable hiccups  eICU Interventions  Plan: One time dose of thorazine 12.5 mg IV.  If hiccups persist nurse to contact primary for additional interventions.   Intervention Category Minor Interventions: Routine modifications to care plan (e.g. PRN medications for pain, fever)  DETERDING,ELIZABETH 08/16/2013, 4:38 AM

## 2013-08-16 NOTE — Evaluation (Signed)
Physical Therapy Evaluation Patient Details Name: [name] MRN: [id number] DOB: [birth year] Today's Date: 08/16/2013 Time: [id number] PT Time Calculation (min): 27 min  PT Assessment / Plan / Recommendation History of Present Illness  79 yo with cad, htn, bladder ca to er with fever, n/v. Underwent cysto Thursday and sick since.  Called last pm, declined er, started on home zofran and cipro.  Symptoms continued and presented to er with continued sxs. He denies cp, sob, cough or sputum.  He denies blood, diarrhea, abdominal pain.  He does relate low back pain, dysuria.  Urology contacted by ED and despite seeming complication from cystoscopy, declined to admit.  Clinical Impression  Pt will benefit from PT to address deficits below;    PT Assessment  Patient needs continued PT services    Follow Up Recommendations  Home health PT    Does the patient have the potential to tolerate intense rehabilitation      Barriers to Discharge        Equipment Recommendations       Recommendations for Other Services     Frequency Min 3X/week    Precautions / Restrictions Precautions Precautions: Fall Precaution Comments: monitor O2 sats   Pertinent Vitals/Pain Sats 89-92% on RA during mobility; O2 replaced at rest and sats >97% on 2L BP in the lower range but did incr with position changes and activity Pt without c/opain but does report feeling weak and a little SOB      Mobility  Bed Mobility Bed Mobility: Supine to Sit Supine to Sit: 3: Mod assist Details for Bed Mobility Assistance: cues for self assist; assist with trunk Transfers Transfers: Sit to Stand;Stand to Sit Sit to Stand: 3: Mod assist;From bed Stand to Sit: 4: Min assist;3: Mod assist;To chair/3-in-1 Details for Transfer Assistance: verbal cues for hand placement and safety Ambulation/Gait Ambulation/Gait Assistance: 1: +2 Total assist Ambulation/Gait: Patient Percentage: 60% Ambulation Distance (Feet): 80  Feet Assistive device: Rolling walker Ambulation/Gait Assistance Details: +2 for balance, RW direction and multiple lines Gait Pattern: Decreased stride length;Decreased trunk rotation    Exercises     PT Diagnosis: Difficulty walking;Generalized weakness  PT Problem List: Decreased strength;Decreased range of motion;Decreased activity tolerance;Decreased balance;Decreased mobility;Decreased knowledge of use of DME PT Treatment Interventions: DME instruction;Gait training;Functional mobility training;Stair training;Therapeutic activities;Therapeutic exercise;Patient/family education     PT Goals(Current goals can be found in the care plan section) Acute Rehab PT Goals Patient Stated Goal: home, return to I  PT Goal Formulation: With patient Time For Goal Achievement: 08/30/13 Potential to Achieve Goals: Good  Visit Information  Last PT Received On: 08/16/13 Assistance Needed: +1 History of Present Illness: 79 yo with cad, htn, bladder ca to er with fever, n/v. Underwent cysto Thursday and sick since.  Called last pm, declined er, started on home zofran and cipro.  Symptoms continued and presented to er with continued sxs. He denies cp, sob, cough or sputum.  He denies blood, diarrhea, abdominal pain.  He does relate low back pain, dysuria.  Urology contacted by ED and despite seeming complication from cystoscopy, declined to admit.       Prior [place] expects to be discharged to:: Private residence Living Arrangements: Spouse/significant other Available Help at Discharge: Family;Available PRN/intermittently Type of Home: House Home Access: Stairs to enter [employer] of Steps: 4 Entrance Stairs-Rails: Right Home Layout: One level;Laundry or work area in basement;Able to live on main level with bedroom/bathroom Home Equipment: Environmental consultant - 2  wheels;Bedside commode Additional Comments: work area in basement; pt likes to build furniture Prior  Function Level of Independence: Independent Communication Communication: No difficulties    Solicitor Arousal/Alertness: Awake/alert Behavior During Therapy: WFL for tasks assessed/performed Overall Cognitive Status: Within Functional Limits for tasks assessed    Extremity/Trunk Assessment Upper Extremity Assessment Upper Extremity Assessment: Generalized weakness Lower Extremity Assessment Lower Extremity Assessment: Generalized weakness   Balance    End of Session PT - End of Session Activity Tolerance: Patient limited by fatigue Patient left: in chair;with call bell/phone within reach;with family/visitor present Nurse Communication: Mobility status  GP     [facility] 08/16/2013, 4:02 PM

## 2013-08-16 NOTE — Progress Notes (Addendum)
Subjective: Admitted for Cystoscopy induced Urosepsis. On IV Abx. Hiccoughs treated last night c Thorazine. No more fever or chills. Feeling some better. Ab is tight. Last BM was yesterday. Foley in place   Objective: Vital signs in last 24 hours: Temp:  [98.4 F (36.9 C)-99.5 F (37.5 C)] 98.9 F (37.2 C) (11/03 0400) Pulse Rate:  [60-74] 66 (11/03 0500) Resp:  [16-31] 31 (11/03 0500) BP: (86-150)/(29-92) 131/92 mmHg (11/03 0500) SpO2:  [92 %-100 %] 97 % (11/03 0500) Weight:  [81.647 kg (180 lb)-84.5 kg (186 lb 4.6 oz)] 84.3 kg (185 lb 13.6 oz) (11/03 0454) Weight change:  Last BM Date: 08/15/13  CBG (last 3)  No results found for this basename: GLUCAP,  in the last 72 hours  Intake/Output from previous day:  Intake/Output Summary (Last 24 hours) at 08/16/13 0727 Last data filed at 08/16/13 0508  Gross per 24 hour  Intake 1904.17 ml  Output    825 ml  Net 1079.17 ml   11/02 0701 - 11/03 0700 In: 1904.2 [I.V.:1529.2; IV Piggyback:375] Out: [id number] [Urine:825]   Physical Exam General appearance: NAD.  Looks tired.  A and O Eyes: no scleral icterus Throat: oropharynx moist without erythema Resp: CTA Cardio: Pacer. Reg GI: soft, non-tender; bowel sounds normal; no masses,  no organomegaly.  Bloated Foley in place Extremities: no clubbing, cyanosis or edema   Lab Results:  Recent Labs  08/15/13 1051 08/16/13 0335  NA 131*  137 136  K 4.1  4.3 4.3  CL 98  106 105  CO2 22 21  GLUCOSE 140*  135* 103*  BUN 37*  46* 36*  CREATININE 2.73*  3.30* 2.67*  CALCIUM 8.7 7.8*     Recent Labs  08/15/13 1051  AST 39*  ALT 14  ALKPHOS 46  BILITOT 0.5  PROT 6.7  ALBUMIN 3.2*     Recent Labs  08/15/13 1051 08/16/13 0335  WBC 12.3* 11.7*  NEUTROABS 10.8*  --   HGB 10.0*  10.5* 9.3*  HCT 31.3*  31.0* 29.1*  MCV 91.3 91.8  PLT 159 132*    Lab Results  Component Value Date   INR 1.0 RATIO 01/25/2008    No results found for this basename:  CKTOTAL, CKMB, CKMBINDEX, TROPONINI,  in the last 72 hours  No results found for this basename: TSH, T4TOTAL, FREET3, T3FREE, THYROIDAB,  in the last 72 hours  No results found for this basename: VITAMINB12, FOLATE, FERRITIN, TIBC, IRON, RETICCTPCT,  in the last 72 hours  Micro Results: Recent Results (from the past 240 hour(s))  MRSA PCR SCREENING     Status: None   Collection Time    08/15/13  3:46 PM      Result Value Range Status   MRSA by PCR NEGATIVE  NEGATIVE Final   Comment:            The GeneXpert MRSA Assay (FDA     approved for NASAL specimens     only), is one component of a     comprehensive MRSA colonization     surveillance program. It is not     intended to diagnose MRSA     infection nor to guide or     monitor treatment for     MRSA infections.     Studies/Results: Ct Abdomen Pelvis Wo Contrast  08/15/2013   CLINICAL DATA:  History of stroke. History of urinary tract infection. Question stones. Urosepsis.  EXAM: CT ABDOMEN AND PELVIS WITHOUT CONTRAST  TECHNIQUE:  Multidetector CT imaging of the abdomen and pelvis was performed following the standard protocol without intravenous contrast.  COMPARISON:  CT of the abdomen and pelvis 05/27/2012  FINDINGS: There is scarring at both lung bases. The patient has AICD, with leads to the right ventricle and coronary sinus. Heart is enlarged.  No focal abnormality identified within the liver, spleen, pancreas, or adrenal glands. The gallbladder is present. Note is made of a hiatal hernia. No intrarenal or ureteral stones are identified. No evidence for renal abscess. The bladder is notable for several cellules. The prostate contains several calcifications.  Small bowel loops have a normal appearance. There are numerous colonic diverticula, especially involving the distal colon. However, no acute diverticulitis identified. The appendix is well seen and has a normal appearance.  There is atherosclerotic calcification of the abdominal  aorta and without aneurysm. No retroperitoneal or mesenteric adenopathy. Significant degenerative changes are identified in the lumbar spine. No suspicious lytic or blastic lesions are noted.  IMPRESSION: 1. Cardiomegaly. 2. Hiatal hernia. 3. No intrarenal or ureteral stones. 4. Bladder cellules, consistent with increased bladder pressure and raising the question of bladder outlet obstruction. 5. Colonic diverticulosis. 6. No evidence for acute diverticulitis. 7. Atherosclerotic abdominal aorta.   Electronically Signed   By: Shon Hale M.D.   On: 08/15/2013 11:51   Dg Chest Portable 1 View  08/15/2013   CLINICAL DATA:  Initial encounter for fever, nausea, and hypotension. Prior history of resection of bladder tumor.  EXAM: PORTABLE CHEST - 1 VIEW  COMPARISON:  Two-view chest x-rays 02/18/2013, 05/19/2012, 07/03/2011, 05/17/2010.  FINDINGS: Suboptimal inspiration accounts for crowded bronchovascular markings, especially in the bases, and accentuates the cardiac silhouette. Cardiac silhouette moderately enlarged even allowing for the AP portable technique and degree of inspiration. Mild pulmonary venous hypertension without overt edema. Lungs clear. No visible pleural effusions. No pneumothorax. Stable biapical pleuroparenchymal scarring. Left subclavian biventricular pacing defibrillator unchanged and appears intact.  IMPRESSION: 1. Suboptimal inspiration.  No acute cardiopulmonary disease. 2. Stable cardiomegaly without pulmonary edema.   Electronically Signed   By: Evangeline Dakin M.D.   On: 08/15/2013 13:36     Medications: Scheduled: . amiodarone  200 mg Oral Daily  . aspirin  325 mg Oral Daily  . atorvastatin  10 mg Oral q1800  . aztreonam  1 g Intravenous Q8H  . carvedilol  6.25 mg Oral BID WC  . clopidogrel  75 mg Oral Q breakfast  . digoxin  0.0625 mg Oral Daily  . enoxaparin (LOVENOX) injection  30 mg Subcutaneous Q24H  . [START ON 08/17/2013] levofloxacin (LEVAQUIN) IV  500 mg Intravenous  Q48H  . levothyroxine  50 mcg Oral QAC breakfast  . pantoprazole  40 mg Oral Daily   Continuous: . sodium chloride 125 mL/hr at 08/15/13 1758     Assessment/Plan: Principal Problem:   Urosepsis Active Problems:   Essential hypertension, benign   CAD, NATIVE VESSEL   CARDIOMYOPATHY, ISCHEMIC   Bladder cancer  Urosepsis due to Cystoscopy done Thursday.  Pt c Bladder CA.  Not on Maintenance BCG but no residual seen.  Continue IV Abx - Aztreonam/Levaquin.  Scale down Abx over the next 1-2 days.  Await Cxs and S. Continue IVF but lower the rate.  Keep Foley until tomorrow, then remove.  ARF/AKI/CKDz - Cr 2.73 up to 3.3 down to 2.67.  Baseline @ 2 - 2.2.  Watch for overload. S/P many IVF boluses.  Watch fluid status and BP.  congestive heart failure c EF  20-25% S/P BiV ICD in the setting of ischemic heart disease/CAD.   History of atrial fibrillation previously treated with amiodarone and not on anticoagulation secondary to GI bleeding  HTN - BP has improved.  Monitor in Step down for 24 more hrs.  Anemia - Hbg falling - Monitor.  Check Iron profile.  Thrombocytopenia - follow.  Feed pt.  Get moving.  PT ordered.  ID -  Anti-infectives   Start     Dose/Rate Route Frequency Ordered Stop   08/17/13 1100  levofloxacin (LEVAQUIN) IVPB 500 mg     500 mg 100 mL/hr over 60 Minutes Intravenous Every 48 hours 08/15/13 1157     08/15/13 2000  aztreonam (AZACTAM) 1 g in dextrose 5 % 50 mL IVPB     1 g 100 mL/hr over 30 Minutes Intravenous Every 8 hours 08/15/13 1157     08/15/13 1115  levofloxacin (LEVAQUIN) IVPB 750 mg     750 mg 100 mL/hr over 90 Minutes Intravenous  Once 08/15/13 1113 08/15/13 1258   08/15/13 1115  aztreonam (AZACTAM) 1 g in dextrose 5 % 50 mL IVPB     1 g 100 mL/hr over 30 Minutes Intravenous  Once 08/15/13 1113 08/15/13 1256     DVT Prophylaxis - on low dose Lovenox.    LOS: 1 day   Bryden Darden M 08/16/2013, 7:27 AM

## 2013-08-16 NOTE — Progress Notes (Signed)
Pt slightly more confused at times this evening noted per staff and wife at bedside.  Pt attempted to get out bed at least 2 times this evening.  Pt oriented to self and time.  Also has some low back pain radiating to shoulders 4/10, low grade temp and MAP<65 and chronic hiccups.  On call MD notified with orders received. Cont antibiotic course  And monitor for changes.

## 2013-08-16 NOTE — Progress Notes (Signed)
GU  Notified of patient's admission yesterday for possible urosepsis. Had uncomplicated office cystoscopy with clear UA on 08/12/13. CT reviewed; this was negative for obstruction of the upper tract, but the bladder was distended. Foley placed last evening. Patient feels better, but still weak.  Filed Vitals:   08/16/13 0500  BP: 131/92  Pulse: 66  Temp:   Resp: 31   Gen: NAD, awake Chest: Equal effort bilaterally, negative use of accessory muscles. Abd: NTTP, ND. GU: Foley in place draining clear yellow urine.  A/P: Possible urosepsis. -Agree with managing team regarding antibiotics. -Plan to d/c foley tomorrow; will see how he does.

## 2013-08-17 DIAGNOSIS — I959 Hypotension, unspecified: Secondary | ICD-10-CM | POA: Diagnosis not present

## 2013-08-17 DIAGNOSIS — D649 Anemia, unspecified: Secondary | ICD-10-CM | POA: Diagnosis not present

## 2013-08-17 DIAGNOSIS — N189 Chronic kidney disease, unspecified: Secondary | ICD-10-CM | POA: Diagnosis not present

## 2013-08-17 DIAGNOSIS — I2510 Atherosclerotic heart disease of native coronary artery without angina pectoris: Secondary | ICD-10-CM | POA: Diagnosis not present

## 2013-08-17 DIAGNOSIS — N179 Acute kidney failure, unspecified: Secondary | ICD-10-CM | POA: Diagnosis not present

## 2013-08-17 DIAGNOSIS — N390 Urinary tract infection, site not specified: Secondary | ICD-10-CM | POA: Diagnosis not present

## 2013-08-17 LAB — CBC
MCH: 29.4 pg (ref 26.0–34.0)
MCHC: 32.4 g/dL (ref 30.0–36.0)
MCV: 90.6 fL (ref 78.0–100.0)
Platelets: 145 10*3/uL — ABNORMAL LOW (ref 150–400)
RDW: 14.3 % (ref 11.5–15.5)

## 2013-08-17 LAB — BASIC METABOLIC PANEL
CO2: 21 mEq/L (ref 19–32)
Calcium: 8.5 mg/dL (ref 8.4–10.5)
Creatinine, Ser: 2.93 mg/dL — ABNORMAL HIGH (ref 0.50–1.35)
Glucose, Bld: 115 mg/dL — ABNORMAL HIGH (ref 70–99)

## 2013-08-17 LAB — IRON AND TIBC
Iron: 10 ug/dL — ABNORMAL LOW (ref 42–135)
TIBC: 176 ug/dL — ABNORMAL LOW (ref 215–435)
UIBC: 166 ug/dL (ref 125–400)

## 2013-08-17 LAB — TYPE AND SCREEN

## 2013-08-17 NOTE — Progress Notes (Signed)
Subjective: Admitted for Cystoscopy induced Urosepsis. On IV Abx. Hiccoughs again treated c Thorazine. Had pulm edema post working c PT yesterday and needed IV Lasix Elevated temp last night. Hypotensive last night requiring resumption of fluids. Less Ab pain Feeling some better. Foley in place Restless till 12:30 am then fell asleep. No longer confused - Last night episode seems related to Thorazine, low temp, Low BP and infection.  Objective: Vital signs in last 24 hours: Temp:  [97.3 F (36.3 C)-99.8 F (37.7 C)] 97.3 F (36.3 C) (11/04 0300) Pulse Rate:  [65-93] 70 (11/04 0330) Resp:  [17-29] 28 (11/04 0330) BP: (71-150)/(33-86) 99/40 mmHg (11/04 0330) SpO2:  [92 %-99 %] 99 % (11/04 0330) Weight change:  Last BM Date: 08/15/13  CBG (last 3)  No results found for this basename: GLUCAP,  in the last 72 hours  Intake/Output from previous day:  Intake/Output Summary (Last 24 hours) at 08/17/13 0609 Last data filed at 08/17/13 0400  Gross per 24 hour  Intake 1218.91 ml  Output   1620 ml  Net -401.09 ml   11/03 0701 - 11/04 0700 In: 968.9 [I.V.:868.9; IV Piggyback:100] Out: [id number] [Urine:1620]   Physical Exam General appearance: NAD.  Looks tired.  A and O.  Wearing FIO2 Eyes: no scleral icterus Throat: oropharynx moist without erythema Resp: mild rales o/w clear Cardio: Pacer. Some Irreg GI: soft, non-tender; bowel sounds normal; no masses,  no organomegaly.  Bloated Foley in place Extremities: no clubbing, cyanosis or edema   Lab Results:  Recent Labs  08/16/13 0335 08/17/13 0305  NA 136 139  K 4.3 3.7  CL 105 108  CO2 21 21  GLUCOSE 103* 115*  BUN 36* 42*  CREATININE 2.67* 2.93*  CALCIUM 7.8* 8.5     Recent Labs  08/15/13 1051  AST 39*  ALT 14  ALKPHOS 46  BILITOT 0.5  PROT 6.7  ALBUMIN 3.2*     Recent Labs  08/15/13 1051 08/16/13 0335 08/17/13 0305  WBC 12.3* 11.7* 9.0  NEUTROABS 10.8*  --   --   HGB 10.0*  10.5* 9.3* 9.4*   HCT 31.3*  31.0* 29.1* 29.0*  MCV 91.3 91.8 90.6  PLT 159 132* 145*    Lab Results  Component Value Date   INR 1.0 RATIO 01/25/2008    No results found for this basename: CKTOTAL, CKMB, CKMBINDEX, TROPONINI,  in the last 72 hours  No results found for this basename: TSH, T4TOTAL, FREET3, T3FREE, THYROIDAB,  in the last 72 hours  No results found for this basename: VITAMINB12, FOLATE, FERRITIN, TIBC, IRON, RETICCTPCT,  in the last 72 hours  Micro Results: Recent Results (from the past 240 hour(s))  CULTURE, BLOOD (ROUTINE X 2)     Status: None   Collection Time    08/15/13 10:41 AM      Result Value Range Status   Specimen Description BLOOD LEFT ARM   Final   Special Requests BOTTLES DRAWN AEROBIC AND ANAEROBIC 5CC EACH   Final   Culture  Setup Time     Final   Value: 08/15/2013 20:57     Performed at [facility]   Culture     Final   Value:        BLOOD CULTURE RECEIVED NO GROWTH TO DATE CULTURE WILL BE HELD FOR 5 DAYS BEFORE ISSUING A FINAL NEGATIVE REPORT     Performed at [facility]   Report Status PENDING   Incomplete  CULTURE, BLOOD (ROUTINE  X 2)     Status: None   Collection Time    08/15/13 11:00 AM      Result Value Range Status   Specimen Description BLOOD LEFT HAND   Final   Special Requests BOTTLES DRAWN AEROBIC AND ANAEROBIC [facility]   Final   Culture  Setup Time     Final   Value: 08/15/2013 20:57     Performed at [facility]   Culture     Final   Value:        BLOOD CULTURE RECEIVED NO GROWTH TO DATE CULTURE WILL BE HELD FOR 5 DAYS BEFORE ISSUING A FINAL NEGATIVE REPORT     Performed at [facility]   Report Status PENDING   Incomplete  URINE CULTURE     Status: None   Collection Time    08/15/13 11:32 AM      Result Value Range Status   Specimen Description URINE, CATHETERIZED   Final   Special Requests NONE   Final   Culture  Setup Time     Final   Value: 08/15/2013 21:22     Performed at [place]     Final   Value: NO GROWTH     Performed at [facility]   Culture     Final   Value: NO GROWTH     Performed at [facility]   Report Status 08/16/2013 FINAL   Final  MRSA PCR SCREENING     Status: None   Collection Time    08/15/13  3:46 PM      Result Value Range Status   MRSA by PCR NEGATIVE  NEGATIVE Final   Comment:            The GeneXpert MRSA Assay (FDA     approved for NASAL specimens     only), is one component of a     comprehensive MRSA colonization     surveillance program. It is not     intended to diagnose MRSA     infection nor to guide or     monitor treatment for     MRSA infections.     Studies/Results: Ct Abdomen Pelvis Wo Contrast  08/15/2013   CLINICAL DATA:  History of stroke. History of urinary tract infection. Question stones. Urosepsis.  EXAM: CT ABDOMEN AND PELVIS WITHOUT CONTRAST  TECHNIQUE: Multidetector CT imaging of the abdomen and pelvis was performed following the standard protocol without intravenous contrast.  COMPARISON:  CT of the abdomen and pelvis 05/27/2012  FINDINGS: There is scarring at both lung bases. The patient has AICD, with leads to the right ventricle and coronary sinus. Heart is enlarged.  No focal abnormality identified within the liver, spleen, pancreas, or adrenal glands. The gallbladder is present. Note is made of a hiatal hernia. No intrarenal or ureteral stones are identified. No evidence for renal abscess. The bladder is notable for several cellules. The prostate contains several calcifications.  Small bowel loops have a normal appearance. There are numerous colonic diverticula, especially involving the distal colon. However, no acute diverticulitis identified. The appendix is well seen and has a normal appearance.  There is atherosclerotic calcification of the abdominal aorta and without aneurysm. No retroperitoneal or mesenteric adenopathy. Significant degenerative changes are identified in the  lumbar spine. No suspicious lytic or blastic lesions are noted.  IMPRESSION: 1. Cardiomegaly. 2. Hiatal hernia. 3. No intrarenal or ureteral stones. 4. Bladder cellules,  consistent with increased bladder pressure and raising the question of bladder outlet obstruction. 5. Colonic diverticulosis. 6. No evidence for acute diverticulitis. 7. Atherosclerotic abdominal aorta.   Electronically Signed   By: Shon Hale M.D.   On: 08/15/2013 11:51   Dg Chest Port 1 View  08/16/2013   CLINICAL DATA:  Worsening shortness of breath, smoker  EXAM: PORTABLE CHEST - 1 VIEW  COMPARISON:  08/15/2013  FINDINGS: Cardiomegaly again evident with worsening diffuse vascular congestion and interstitial changes concerning for developing mild CHF pattern. Minor basilar atelectasis. No enlarging effusion or pneumothorax. Trachea midline. Left subclavian AICD/ pacer evident.  IMPRESSION: Cardiomegaly with mild developing edema pattern and basilar atelectasis. Suspect CHF.   Electronically Signed   By: Daryll Brod M.D.   On: 08/16/2013 16:31   Dg Chest Portable 1 View  08/15/2013   CLINICAL DATA:  Initial encounter for fever, nausea, and hypotension. Prior history of resection of bladder tumor.  EXAM: PORTABLE CHEST - 1 VIEW  COMPARISON:  Two-view chest x-rays 02/18/2013, 05/19/2012, 07/03/2011, 05/17/2010.  FINDINGS: Suboptimal inspiration accounts for crowded bronchovascular markings, especially in the bases, and accentuates the cardiac silhouette. Cardiac silhouette moderately enlarged even allowing for the AP portable technique and degree of inspiration. Mild pulmonary venous hypertension without overt edema. Lungs clear. No visible pleural effusions. No pneumothorax. Stable biapical pleuroparenchymal scarring. Left subclavian biventricular pacing defibrillator unchanged and appears intact.  IMPRESSION: 1. Suboptimal inspiration.  No acute cardiopulmonary disease. 2. Stable cardiomegaly without pulmonary edema.   Electronically  Signed   By: Evangeline Dakin M.D.   On: 08/15/2013 13:36     Medications: Scheduled: . amiodarone  200 mg Oral Daily  . aspirin  325 mg Oral Daily  . atorvastatin  10 mg Oral q1800  . aztreonam  1 g Intravenous Q8H  . carvedilol  6.25 mg Oral BID WC  . clopidogrel  75 mg Oral Q breakfast  . digoxin  0.0625 mg Oral Daily  . enoxaparin (LOVENOX) injection  30 mg Subcutaneous Q24H  . levofloxacin (LEVAQUIN) IV  500 mg Intravenous Q48H  . levothyroxine  50 mcg Oral QAC breakfast  . pantoprazole  40 mg Oral Daily   Continuous: . sodium chloride 100 mL/hr at 08/17/13 0124     Assessment/Plan: Principal Problem:   Urosepsis Active Problems:   Essential hypertension, benign   CAD, NATIVE VESSEL   CARDIOMYOPATHY, ISCHEMIC   Bladder cancer  Urosepsis due to Cystoscopy done Thursday.  Pt c Bladder CA.  Not on Maintenance BCG but no residual seen.  Continue IV Abx - Aztreonam/Levaquin.  Scale down Abx over the next 1-2 days as Cxs and S come out. Continue IVF but lower the rate.  Try to remove Foley today but keep Foley until more stable.  ARF/AKI/CKDz - Cr 2.73 up to 3.3 down to 2.67 - 2.93.  Baseline @ 2 - 2.2.  Abx are renally dosed.  Volume overload yesterday over responded to V Lasix and now back on gentle hydration - will follow closely. BP is soft.  Was in 6's - now in 40's  congestive heart failure c EF 20-25% S/P BiV ICD in the setting of ischemic heart disease/CAD.   History of atrial fibrillation treated with amiodarone and not on anticoagulation secondary to GI bleeding  HTN - BP erratic and low.  Monitor in Step down for another 24 more hrs.  Anemia - Hbg fell and now stable.  Type and screen done - Monitor.  Check Iron profile.  Thrombocytopenia - stable and follow.  Feed pt.  Get moving.  PT ordered.  Will continue to micro-manage in Step down and hopefully move to floor tomorrow - Home Thursday??  ID -  Anti-infectives   Start     Dose/Rate Route Frequency  Ordered Stop   08/17/13 1100  levofloxacin (LEVAQUIN) IVPB 500 mg     500 mg 100 mL/hr over 60 Minutes Intravenous Every 48 hours 08/15/13 1157     08/15/13 2000  aztreonam (AZACTAM) 1 g in dextrose 5 % 50 mL IVPB     1 g 100 mL/hr over 30 Minutes Intravenous Every 8 hours 08/15/13 1157     08/15/13 1115  levofloxacin (LEVAQUIN) IVPB 750 mg     750 mg 100 mL/hr over 90 Minutes Intravenous  Once 08/15/13 1113 08/15/13 1258   08/15/13 1115  aztreonam (AZACTAM) 1 g in dextrose 5 % 50 mL IVPB     1 g 100 mL/hr over 30 Minutes Intravenous  Once 08/15/13 1113 08/15/13 1256     DVT Prophylaxis - on low dose Lovenox.    LOS: 2 days   Calyb Mcquarrie M 08/17/2013, 6:09 AM    Atrovent neb Ordered.

## 2013-08-17 NOTE — Progress Notes (Signed)
GU   Patient with hypotension and SOB overnight. Now resolved. Has a productive cough. Had emesis from coughing.    Filed Vitals:   08/17/13 0700  BP: 95/38  Pulse: 71  Temp:   Resp: 22   Gen: NAD, awake Chest: Equal effort bilaterally, negative use of accessory muscles. Abd: NTTP, ND. GU: Foley in place draining clear yellow urine.  A/P: SIRS. ARF on CRI. -Urine cultures negative (could be due to the one dose of antibiotic he had on Saturday) or could be SIRS from another source. -I would leave the catheter for now to ensure there is no component of outlet obstruction associated with his renal dysfunction. His renal dysfunction is most likely due to a pre-renal cause. -I have ordered for cath to come out early tomorrow morning.

## 2013-08-17 NOTE — Progress Notes (Signed)
Pt hypotensive L) 87/33 R) 76/47.  Dr. Philip Aspen notified. Orders to increase IVF.

## 2013-08-18 ENCOUNTER — Encounter (HOSPITAL_COMMUNITY): Payer: Medicare Other

## 2013-08-18 DIAGNOSIS — N189 Chronic kidney disease, unspecified: Secondary | ICD-10-CM | POA: Diagnosis not present

## 2013-08-18 DIAGNOSIS — D649 Anemia, unspecified: Secondary | ICD-10-CM | POA: Diagnosis not present

## 2013-08-18 DIAGNOSIS — N390 Urinary tract infection, site not specified: Secondary | ICD-10-CM | POA: Diagnosis not present

## 2013-08-18 DIAGNOSIS — I959 Hypotension, unspecified: Secondary | ICD-10-CM | POA: Diagnosis not present

## 2013-08-18 DIAGNOSIS — I2510 Atherosclerotic heart disease of native coronary artery without angina pectoris: Secondary | ICD-10-CM | POA: Diagnosis not present

## 2013-08-18 DIAGNOSIS — N179 Acute kidney failure, unspecified: Secondary | ICD-10-CM | POA: Diagnosis not present

## 2013-08-18 LAB — CBC
HCT: 29.8 % — ABNORMAL LOW (ref 39.0–52.0)
MCH: 28.9 pg (ref 26.0–34.0)
MCV: 89.8 fL (ref 78.0–100.0)
Platelets: 170 10*3/uL (ref 150–400)
RDW: 14.4 % (ref 11.5–15.5)
WBC: 8.2 10*3/uL (ref 4.0–10.5)

## 2013-08-18 LAB — BASIC METABOLIC PANEL
BUN: 38 mg/dL — ABNORMAL HIGH (ref 6–23)
CO2: 20 mEq/L (ref 19–32)
Calcium: 8.4 mg/dL (ref 8.4–10.5)
Chloride: 108 mEq/L (ref 96–112)
Creatinine, Ser: 2.38 mg/dL — ABNORMAL HIGH (ref 0.50–1.35)
Glucose, Bld: 124 mg/dL — ABNORMAL HIGH (ref 70–99)

## 2013-08-18 MED ORDER — FERUMOXYTOL INJECTION 510 MG/17 ML
510.0000 mg | Freq: Once | INTRAVENOUS | Status: AC
  Administered 2013-08-18: 510 mg via INTRAVENOUS
  Filled 2013-08-18: qty 17

## 2013-08-18 MED ORDER — ACETAMINOPHEN 325 MG PO TABS: 650.0000 mg | ORAL_TABLET | Freq: Four times a day (QID) | ORAL | Status: DC | PRN

## 2013-08-18 MED ORDER — LEVOFLOXACIN 250 MG PO TABS: 250.0000 mg | ORAL_TABLET | Freq: Every day | ORAL | Status: DC

## 2013-08-18 MED ORDER — LEVOFLOXACIN 250 MG PO TABS
250.0000 mg | ORAL_TABLET | Freq: Every day | ORAL | Status: DC
  Administered 2013-08-18: 250 mg via ORAL
  Filled 2013-08-18: qty 1

## 2013-08-18 NOTE — Discharge Summary (Signed)
Physician Discharge Summary  DISCHARGE SUMMARY   Patient ID: [name] MR#: [id number] DOB/AGE: 15-Aug-1932 79 y.o.   Attending 79 M  Patient's [id number] M, MD  Consults:Treatment Team:  Molli Hazard, MD**  Admit date: 08/15/2013 Discharge date: 08/18/2013  Discharge Diagnoses:  Principal Problem:   Urosepsis Active Problems:   Essential hypertension, benign   CAD, NATIVE VESSEL   CARDIOMYOPATHY, ISCHEMIC   Bladder cancer   Patient Active Problem List   Diagnosis Date Noted  . Urosepsis 08/16/2013  . Bruising 08/12/2012  . Bladder cancer 07/08/2012  . Bladder tumor 05/18/2012  . Knee pain, acute, right 02/11/2012  . CAROTID ARTERY DISEASE 12/06/2010  . Essential hypertension, benign 09/22/2009  . SYSTOLIC HEART FAILURE, CHRONIC 09/14/2009  . CHEST PAIN 08/08/2009  . MIXED HYPERLIPIDEMIA 05/25/2009  . CAD, NATIVE VESSEL 02/01/2009  . CARDIOMYOPATHY, ISCHEMIC 01/24/2009  . Atrial fibrillation 01/21/2009  . ICD-CRT-St. Jude 01/21/2009   Past Medical History  Diagnosis Date  . CAD (coronary artery disease)      a. s/p anterior MI 12/05 c/b shock -> stent LAD   b. s/p stenting OM-1, 2/06  . CHF (congestive heart failure)     due to ischemic CM  a. EF 20-30%. (Nov 2008)   b. s/p St. Jude BiV-ICD    c. CPX 07/2008  pvo2 16.3 (63% predicted) slope 34 RER 1.08 O2 pulse 93%  . Hyperlipidemia   . Hypothyroidism   . GERD (gastroesophageal reflux disease)     hx Barretts esophagitis  . Anemia   . 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 Myocardial infarction 09/15/2004  . History of blood transfusion     following coumadin  usage  . Cough     occasional /productive, no fever/ not new  . ICD (implantable cardiac defibrillator) in place     BiV/ICD [address], Model [id number], Serial # [id number], implanted 01/29/08  . Skin cancer     basal cells   facial x 4, 1 right forearm  . Bladder cancer  dx'd 05/2012  . HTN (hypertension)     EKG 05/14/12,Chest x ray 8/13, last ICD interrogation 8/13 EPIC  . Atrial fibrillation or flutter     maintaining sinus on amiodarone    . Sleep apnea     STOP BANG SCORE 4  . Pacemaker   . CRI (chronic renal insufficiency)     (baseline 2.0-2.2)/ recent hospitalization 8/13    Discharged Condition: good   Discharge Medications:   Medication List    STOP taking these medications       ciprofloxacin 500 MG tablet  Commonly known as:  CIPRO     furosemide 20 MG tablet  Commonly known as:  LASIX     potassium chloride 10 MEQ tablet  Commonly known as:  K-DUR     valsartan 80 MG tablet  Commonly known as:  DIOVAN      TAKE these medications       acetaminophen 325 MG tablet  Commonly known as:  TYLENOL  Take 2 tablets (650 mg total) by mouth every 6 (six) hours as needed for mild pain (or Fever >/= 101).     aspirin 325 MG tablet  Take 325 mg by mouth daily.     carvedilol 6.25 MG tablet  Commonly known as:  COREG  TAKE ONE TABLET BY MOUTH TWICE DAILY WITH MEALS     CENTRUM SILVER PO  Take  0.5 tablets by mouth 2 (two) times daily.     clopidogrel 75 MG tablet  Commonly known as:  PLAVIX  TAKE ONE TABLET BY MOUTH ONCE DAILY     [employer] IRON 325 (65 FE) MG tablet  Generic drug:  ferrous sulfate  Take 325 mg by mouth 3 (three) times a week. 3 times a week     digoxin 0.125 MG tablet  Commonly known as:  LANOXIN  Take 0.5 tablets (0.0625 mg total) by mouth daily.     levofloxacin 250 MG tablet  Commonly known as:  LEVAQUIN  Take 1 tablet (250 mg total) by mouth daily.     levothyroxine 50 MCG tablet  Commonly known as:  SYNTHROID, LEVOTHROID  Take 50 mcg by mouth daily before breakfast.     neomycin-bacitracin-polymyxin [phone] ointment  Apply 1 application topically 4 (four) times daily. Apply to cath insertion     nitroGLYCERIN 0.4 MG SL tablet  Commonly known as:  NITROSTAT  Place 1 tablet (0.4 mg total) under  the tongue every 5 (five) minutes as needed. For chest pain     PACERONE 200 MG tablet  Generic drug:  amiodarone  TAKE ONE-HALF TABLET BY MOUTH EVERY DAY     pantoprazole 40 MG tablet  Commonly known as:  PROTONIX  Take 1 tablet by mouth daily.     rosuvastatin 20 MG tablet  Commonly known as:  CRESTOR  TAKE ONE TABLET BY MOUTH EVERY DAY WITH  SUPPER     senna-docusate 8.6-50 MG per tablet  Commonly known as:  Senokot-S  Take 1 tablet by mouth as needed.     ZETIA 10 MG tablet  Generic drug:  ezetimibe  TAKE ONE TABLET BY MOUTH EVERY DAY WITH SUPPER        Hospital Procedures: Ct Abdomen Pelvis Wo Contrast  08/15/2013   CLINICAL DATA:  History of stroke. History of urinary tract infection. Question stones. Urosepsis.  EXAM: CT ABDOMEN AND PELVIS WITHOUT CONTRAST  TECHNIQUE: Multidetector CT imaging of the abdomen and pelvis was performed following the standard protocol without intravenous contrast.  COMPARISON:  CT of the abdomen and pelvis 05/27/2012  FINDINGS: There is scarring at both lung bases. The patient has AICD, with leads to the right ventricle and coronary sinus. Heart is enlarged.  No focal abnormality identified within the liver, spleen, pancreas, or adrenal glands. The gallbladder is present. Note is made of a hiatal hernia. No intrarenal or ureteral stones are identified. No evidence for renal abscess. The bladder is notable for several cellules. The prostate contains several calcifications.  Small bowel loops have a normal appearance. There are numerous colonic diverticula, especially involving the distal colon. However, no acute diverticulitis identified. The appendix is well seen and has a normal appearance.  There is atherosclerotic calcification of the abdominal aorta and without aneurysm. No retroperitoneal or mesenteric adenopathy. Significant degenerative changes are identified in the lumbar spine. No suspicious lytic or blastic lesions are noted.  IMPRESSION: 1.  Cardiomegaly. 2. Hiatal hernia. 3. No intrarenal or ureteral stones. 4. Bladder cellules, consistent with increased bladder pressure and raising the question of bladder outlet obstruction. 5. Colonic diverticulosis. 6. No evidence for acute diverticulitis. 7. Atherosclerotic abdominal aorta.   Electronically Signed   By: Shon Hale M.D.   On: 08/15/2013 11:51   Dg Chest Port 1 View  08/16/2013   CLINICAL DATA:  Worsening shortness of breath, smoker  EXAM: PORTABLE CHEST - 1 VIEW  COMPARISON:  08/15/2013  FINDINGS: Cardiomegaly again evident with worsening diffuse vascular congestion and interstitial changes concerning for developing mild CHF pattern. Minor basilar atelectasis. No enlarging effusion or pneumothorax. Trachea midline. Left subclavian AICD/ pacer evident.  IMPRESSION: Cardiomegaly with mild developing edema pattern and basilar atelectasis. Suspect CHF.   Electronically Signed   By: Daryll Brod M.D.   On: 08/16/2013 16:31   Dg Chest Portable 1 View  08/15/2013   CLINICAL DATA:  Initial encounter for fever, nausea, and hypotension. Prior history of resection of bladder tumor.  EXAM: PORTABLE CHEST - 1 VIEW  COMPARISON:  Two-view chest x-rays 02/18/2013, 05/19/2012, 07/03/2011, 05/17/2010.  FINDINGS: Suboptimal inspiration accounts for crowded bronchovascular markings, especially in the bases, and accentuates the cardiac silhouette. Cardiac silhouette moderately enlarged even allowing for the AP portable technique and degree of inspiration. Mild pulmonary venous hypertension without overt edema. Lungs clear. No visible pleural effusions. No pneumothorax. Stable biapical pleuroparenchymal scarring. Left subclavian biventricular pacing defibrillator unchanged and appears intact.  IMPRESSION: 1. Suboptimal inspiration.  No acute cardiopulmonary disease. 2. Stable cardiomegaly without pulmonary edema.   Electronically Signed   By: Evangeline Dakin M.D.   On: 08/15/2013 13:36    History of Present  Illness: 79 yo with cad, htn, bladder ca who underwent maintenance Cysto on Thursday and no residual Bladder CA.  He developed fever, n/v, weakness, Dysuria, FTT, and fever.  He was started on home zofran and cipro. Symptoms continued and presented to er withworsening sxs.  He was noted to have Urosepsis c DeH, ARF, and hypotension.  Hospital Course: Admitted for Cystoscopy (done 08/12/13) induced Urosepsis. Placed on IVF and IV Abx. He defervesced and improved.  Foley was placed.  BP responded to fluids, but we overshot and he went into flash pulmonary edema requiring IV Lasix.  His BP remained soft and he was kept off the PO lasix and his Valsartan - which will be restarted as an outpatient as the need arises.  As he improved his IV Abx were converted over to PO.  U Cx remained (-).  His Creatinine improved then worsened then improved towards baseline.  He was determined to be stable and ready for D/c 08/19/13.  He was unsteady on feet in the morning.  He worked c PT and nursing throughout the day and improved. Home health PT was recommended and set up via [place]. Rolling walker with 5" wheels (pt reports RW at home is unreachable (in a barn)) was also provided.  Foley was removed per URO.  He was able to Urinate without difficulty.  He will finish up 3 more days of PO Levaquin.  His FIO2 had been weaned to off.  He had Hiccoughs 2x and both times treated c Thorazine (made him confused).   Bladder CA. Not on Maintenance BCG but no residual seen.   ARF/AKI/CKDz - Cr 2.73 -3.3 - 2.67 - 2.93 - 2.38. Baseline @ 2 - 2.2. Abx are renally dosed.   BP is soft but better - adjust BP meds as outpt.   Congestive heart failure c EF 20-25% S/P BiV ICD in the setting of ischemic heart disease/CAD - monitor BP and Vol.  History of atrial fibrillation treated with amiodarone and not on anticoagulation secondary to GI bleeding  HTN - BP erratic and low.  Anemia - Hbg stable - Monitor. Iron profile low  and given Feraheme.  Will be D/ced on oral Iron.  Thrombocytopenia - stable and follow.   Day of Discharge Exam BP 98/53  Pulse 70  Temp(Src) 98.2 F (36.8 C) (Oral)  Resp 19  Ht 5\' 10"  (1.778 m)  Wt 80 kg (176 lb 5.9 oz)  BMI 25.31 kg/m2  SpO2 94%  Physical Exam: See PN   Discharge Labs:  Recent Labs  08/17/13 0305 08/18/13 0330  NA 139 137  K 3.7 3.8  CL 108 108  CO2 21 20  GLUCOSE 115* 124*  BUN 42* 38*  CREATININE 2.93* 2.38*  CALCIUM 8.5 8.4   No results found for this basename: AST, ALT, ALKPHOS, BILITOT, PROT, ALBUMIN,  in the last 72 hours  Recent Labs  08/17/13 0305 08/18/13 0330  WBC 9.0 8.2  HGB 9.4* 9.6*  HCT 29.0* 29.8*  MCV 90.6 89.8  PLT 145* 170   No results found for this basename: CKTOTAL, CKMB, CKMBINDEX, TROPONINI,  in the last 72 hours No results found for this basename: TSH, T4TOTAL, FREET3, T3FREE, THYROIDAB,  in the last 72 hours  Recent Labs  08/17/13 0305  TIBC 176*  IRON 10*   Lab Results  Component Value Date   INR 1.0 RATIO 01/25/2008       Discharge instructions:  01-Home or Self Care     Follow-up Information   Follow up with [name], MD In 5 days.   Specialty:  Internal Medicine   Contact information:   [facility] [place] [address] [phone]       Follow up with Molli Hazard, MD. (As needed)    Specialty:  Urology   Contact information:   [address], [place]  [id number] [phone]        Disposition: home Follow-up Appts: Follow-up with Dr. Virgina Jock at [facility] in a few days.  Call for appointment.  Condition on Discharge: stable  Tests Needing Follow-up: Labs at [facility]  Time spent in discharge (includes decision making & examination of pt): 40 min  Signed: Krystelle Prashad M 08/18/2013, 2:43 PM

## 2013-08-18 NOTE — Progress Notes (Signed)
Physical Therapy Treatment Patient Details Name: [name] MRN: [id number] DOB: 02/10/1932 Today's Date: 08/18/2013 Time: [id number] PT Time Calculation (min): 27 min  PT Assessment / Plan / Recommendation  History of Present Illness 79 yo with cad, htn, bladder ca to er with fever, n/v. Underwent cysto Thursday and sick since.  Called last pm, declined er, started on home zofran and cipro.  Symptoms continued and presented to er with continued sxs. He denies cp, sob, cough or sputum.  He denies blood, diarrhea, abdominal pain.  He does relate low back pain, dysuria.  Urology contacted by ED and despite seeming complication from cystoscopy, declined to admit.   PT Comments   Pt demonstrating progress as he was able to ambulate longer distances with less assist today. PT educated pt and pt's wife on the importance of using RW during ambulation for safety and that pt would benefit from HHPT in order to increase LE strength, endurance, and safety; pt and pt's wife seem agreeable. Pt would continue to benefit from skilled PT to improve functional mobility and safety.  Follow Up Recommendations  Home health PT     Does the patient have the potential to tolerate intense rehabilitation     Barriers to Discharge        Equipment Recommendations  Rolling walker with 5" wheels (pt reports RW at home is unreachable (in a barn))    Recommendations for Other Services    Frequency Min 3X/week   Progress towards PT Goals Progress towards PT goals: Progressing toward goals  Plan Current plan remains appropriate    Precautions / Restrictions Precautions Precautions: Fall Precaution Comments: monitor O2 sats Restrictions Weight Bearing Restrictions: No   Pertinent Vitals/Pain No c/o pain during session. SaO2 at rest on room air: 93-96%; SaO2 during ambulation on room air: 91-95% with no c/o SOB; SaO2 after amb on room air: 94-95%.     Mobility  Bed Mobility Bed Mobility: Supine to  Sit;Sitting - Scoot to Edge of Bed Supine to Sit: 5: Supervision Sitting - Scoot to Edge of Bed: 6: Modified independent (Device/Increase time) Details for Bed Mobility Assistance: supervision to ensure safety and to manage lines. SaO2 monitored during session, please see vitals section for detail. Transfers Transfers: Sit to Stand;Stand to Sit Sit to Stand: 4: Min guard;From bed;With upper extremity assist Stand to Sit: 4: Min guard;To chair/3-in-1;With upper extremity assist;With armrests Details for Transfer Assistance: min guard to ensure safety, VC's for hand placement. Ambulation/Gait Ambulation/Gait Assistance: 4: Min guard Ambulation Distance (Feet): 350 Feet Assistive device: Rolling walker Ambulation/Gait Assistance Details: min guard to ensure safety with RW assisting to steady pt during ambulation, with pt progressing to supervision. VC's for upright posture, proper gait sequence, and to remain close to RW. pt required standing rest breaks during ambulation due to fatigue but no reports of SOB. Gait Pattern: Decreased stride length;Step-through pattern;Trunk flexed Gait velocity: decreased General Gait Details: PT educated pt on the importance of utilizing RW at all times to improve safety during ambulation.    Exercises     PT Diagnosis:    PT Problem List:   PT Treatment Interventions:     PT Goals (current goals can now be found in the care plan section)    Visit Information  Last PT Received On: 08/18/13 Assistance Needed: +1 History of Present Illness: 79 yo with cad, htn, bladder ca to er with fever, n/v. Underwent cysto Thursday and sick since.  Called last pm, declined er, started  on home zofran and cipro.  Symptoms continued and presented to er with continued sxs. He denies cp, sob, cough or sputum.  He denies blood, diarrhea, abdominal pain.  He does relate low back pain, dysuria.  Urology contacted by ED and despite seeming complication from cystoscopy, declined  to admit.    Subjective Data      Cognition  Cognition Arousal/Alertness: Awake/alert Behavior During Therapy: WFL for tasks assessed/performed Overall Cognitive Status: Within Functional Limits for tasks assessed    Balance     End of Session PT - End of Session Equipment Utilized During Treatment: Gait belt Activity Tolerance: Patient tolerated treatment well Patient left: in chair;with call bell/phone within reach;with family/visitor present Nurse Communication: Mobility status;Other (comment) (case manager notified pt needs RW to d/c home.)   GP     Audie Clear 08/18/2013, 12:38 PM

## 2013-08-18 NOTE — Progress Notes (Signed)
GU  No acute events overnight. Denies urinary urgency or flank pain.   ROS: negative SOB or chest pain.  Filed Vitals:   08/18/13 0600  BP: 102/46  Pulse: 70  Temp:   Resp: 26   Gen: NAD, awake Chest: Equal effort bilaterally, negative use of accessory muscles. Abd: NTTP, ND. GU: Foley in place draining clear yellow urine.  Microbiology: Blood and urine cultures: no growth.  A/P: SIRS. ARF on CRI. -Voiding trial today; nurse to contact me w/ results of a bladder scan if he has not voided by noon. -Continue antibiotics.

## 2013-08-18 NOTE — Progress Notes (Signed)
I have reviewed this note and agree with all findings. Kati Mylissa Lambe, PT, DPT Pager: [phone]   

## 2013-08-18 NOTE — Progress Notes (Signed)
Subjective: Admitted for Cystoscopy induced Urosepsis. On IV Abx. Hiccoughs gone.  Breathing well BPs better No Ab pain Foley out and he has urinated. Mild cough Asking when he can be D/ced  Objective: Vital signs in last 24 hours: Temp:  [98 F (36.7 C)-98.5 F (36.9 C)] 98.5 F (36.9 C) (11/05 0425) Pulse Rate:  [66-115] 70 (11/05 0600) Resp:  [17-26] 26 (11/05 0600) BP: (93-127)/(32-55) 102/46 mmHg (11/05 0600) SpO2:  [93 %-100 %] 94 % (11/05 0600) Weight:  [80 kg (176 lb 5.9 oz)] 80 kg (176 lb 5.9 oz) (11/05 0620) Weight change:  Last BM Date: 08/15/13  CBG (last 3)  No results found for this basename: GLUCAP,  in the last 72 hours  Intake/Output from previous day:  Intake/Output Summary (Last 24 hours) at 08/18/13 0711 Last data filed at 08/18/13 0600  Gross per 24 hour  Intake   1905 ml  Output   1915 ml  Net    -10 ml   11/04 0701 - 11/05 0700 In: 1905 [P.O.:480; I.V.:1175; IV Piggyback:250] Out: 1915 [Urine:1915]   Physical Exam General appearance: NAD.  Looks better  A and O.  off FIO2 Eyes: no scleral icterus Throat: oropharynx moist without erythema Resp: no rales o/w clear Cardio: Pacer. Some Irreg GI: soft, non-tender; bowel sounds normal; no masses,  no organomegaly.  Bloated Foley out Extremities: no clubbing, cyanosis or edema   Lab Results:  Recent Labs  08/17/13 0305 08/18/13 0330  NA 139 137  K 3.7 3.8  CL 108 108  CO2 21 20  GLUCOSE 115* 124*  BUN 42* 38*  CREATININE 2.93* 2.38*  CALCIUM 8.5 8.4     Recent Labs  08/15/13 1051  AST 39*  ALT 14  ALKPHOS 46  BILITOT 0.5  PROT 6.7  ALBUMIN 3.2*     Recent Labs  08/15/13 1051  08/17/13 0305 08/18/13 0330  WBC 12.3*  < > 9.0 8.2  NEUTROABS 10.8*  --   --   --   HGB 10.0*  10.5*  < > 9.4* 9.6*  HCT 31.3*  31.0*  < > 29.0* 29.8*  MCV 91.3  < > 90.6 89.8  PLT 159  < > 145* 170  < > = values in this interval not displayed.  Lab Results  Component Value Date   INR 1.0 RATIO 01/25/2008    No results found for this basename: CKTOTAL, CKMB, CKMBINDEX, TROPONINI,  in the last 72 hours  No results found for this basename: TSH, T4TOTAL, FREET3, T3FREE, THYROIDAB,  in the last 72 hours   Recent Labs  08/17/13 0305  TIBC 176*  IRON 10*    Micro Results: Recent Results (from the past 240 hour(s))  CULTURE, BLOOD (ROUTINE X 2)     Status: None   Collection Time    08/15/13 10:41 AM      Result Value Range Status   Specimen Description BLOOD LEFT ARM   Final   Special Requests BOTTLES DRAWN AEROBIC AND ANAEROBIC 5CC EACH   Final   Culture  Setup Time     Final   Value: 08/15/2013 20:57     Performed at [facility]   Culture     Final   Value:        BLOOD CULTURE RECEIVED NO GROWTH TO DATE CULTURE WILL BE HELD FOR 5 DAYS BEFORE ISSUING A FINAL NEGATIVE REPORT     Performed at [facility]   Report Status PENDING  Incomplete  CULTURE, BLOOD (ROUTINE X 2)     Status: None   Collection Time    08/15/13 11:00 AM      Result Value Range Status   Specimen Description BLOOD LEFT HAND   Final   Special Requests BOTTLES DRAWN AEROBIC AND ANAEROBIC [facility]   Final   Culture  Setup Time     Final   Value: 08/15/2013 20:57     Performed at [facility]   Culture     Final   Value:        BLOOD CULTURE RECEIVED NO GROWTH TO DATE CULTURE WILL BE HELD FOR 5 DAYS BEFORE ISSUING A FINAL NEGATIVE REPORT     Performed at [facility]   Report Status PENDING   Incomplete  URINE CULTURE     Status: None   Collection Time    08/15/13 11:32 AM      Result Value Range Status   Specimen Description URINE, CATHETERIZED   Final   Special Requests NONE   Final   Culture  Setup Time     Final   Value: 08/15/2013 21:22     Performed at [facility]     Final   Value: NO GROWTH     Performed at [facility]   Culture     Final   Value: NO GROWTH     Performed at [facility]   Report  Status 08/16/2013 FINAL   Final  MRSA PCR SCREENING     Status: None   Collection Time    08/15/13  3:46 PM      Result Value Range Status   MRSA by PCR NEGATIVE  NEGATIVE Final   Comment:            The GeneXpert MRSA Assay (FDA     approved for NASAL specimens     only), is one component of a     comprehensive MRSA colonization     surveillance program. It is not     intended to diagnose MRSA     infection nor to guide or     monitor treatment for     MRSA infections.     Studies/Results: Dg Chest Port 1 View  08/16/2013   CLINICAL DATA:  Worsening shortness of breath, smoker  EXAM: PORTABLE CHEST - 1 VIEW  COMPARISON:  08/15/2013  FINDINGS: Cardiomegaly again evident with worsening diffuse vascular congestion and interstitial changes concerning for developing mild CHF pattern. Minor basilar atelectasis. No enlarging effusion or pneumothorax. Trachea midline. Left subclavian AICD/ pacer evident.  IMPRESSION: Cardiomegaly with mild developing edema pattern and basilar atelectasis. Suspect CHF.   Electronically Signed   By: Daryll Brod M.D.   On: 08/16/2013 16:31     Medications: Scheduled: . amiodarone  200 mg Oral Daily  . aspirin  325 mg Oral Daily  . atorvastatin  10 mg Oral q1800  . aztreonam  1 g Intravenous Q8H  . carvedilol  6.25 mg Oral BID WC  . clopidogrel  75 mg Oral Q breakfast  . digoxin  0.0625 mg Oral Daily  . enoxaparin (LOVENOX) injection  30 mg Subcutaneous Q24H  . levofloxacin (LEVAQUIN) IV  500 mg Intravenous Q48H  . levothyroxine  50 mcg Oral QAC breakfast  . pantoprazole  40 mg Oral Daily   Continuous: . sodium chloride 50 mL/hr at 08/18/13 0335     Assessment/Plan: Principal Problem:   Urosepsis  Active Problems:   Essential hypertension, benign   CAD, NATIVE VESSEL   CARDIOMYOPATHY, ISCHEMIC   Bladder cancer  Urosepsis due to Cystoscopy done Thursday.  U Cx (-).  Switch to PO Levaquin today.  Transfer to floor and work towards D/c later  today or tomorrow.    Pt c Bladder CA.  Not on Maintenance BCG but no residual seen.  Continue IV Abx  ARF/AKI/CKDz - Cr 2.73 -3.3 - 2.67 - 2.93 - 2.38.  Baseline @ 2 - 2.2.  Abx are renally dosed. Hep Lock IV.  Try to remove Foley today as URO has written.  Volume overload resolved.  Wean FIO2. BP is soft but better.  congestive heart failure c EF 20-25% S/P BiV ICD in the setting of ischemic heart disease/CAD - monitor BP and Vol.   History of atrial fibrillation treated with amiodarone and not on anticoagulation secondary to GI bleeding  HTN - BP erratic and low.  Monitor in Step down for another 24 more hrs. Coreg was held c Low BP and had fast HR yesterday but HR better this am.  Anemia - Hbg stable - Monitor.  Iron profile low.  Thrombocytopenia - stable and follow.  ID -  Anti-infectives   Start     Dose/Rate Route Frequency Ordered Stop   08/17/13 1100  levofloxacin (LEVAQUIN) IVPB 500 mg     500 mg 100 mL/hr over 60 Minutes Intravenous Every 48 hours 08/15/13 1157     08/15/13 2000  aztreonam (AZACTAM) 1 g in dextrose 5 % 50 mL IVPB     1 g 100 mL/hr over 30 Minutes Intravenous Every 8 hours 08/15/13 1157     08/15/13 1115  levofloxacin (LEVAQUIN) IVPB 750 mg     750 mg 100 mL/hr over 90 Minutes Intravenous  Once 08/15/13 1113 08/15/13 1258   08/15/13 1115  aztreonam (AZACTAM) 1 g in dextrose 5 % 50 mL IVPB     1 g 100 mL/hr over 30 Minutes Intravenous  Once 08/15/13 1113 08/15/13 1256     DVT Prophylaxis - on low dose Lovenox.    LOS: 3 days   Arwyn Besaw M 08/18/2013, 7:11 AM

## 2013-08-20 ENCOUNTER — Encounter: Payer: Medicare Other | Admitting: *Deleted

## 2013-08-20 ENCOUNTER — Encounter: Payer: Self-pay | Admitting: Internal Medicine

## 2013-08-20 ENCOUNTER — Other Ambulatory Visit: Payer: Self-pay | Admitting: Internal Medicine

## 2013-08-20 DIAGNOSIS — I10 Essential (primary) hypertension: Secondary | ICD-10-CM | POA: Diagnosis not present

## 2013-08-20 DIAGNOSIS — R627 Adult failure to thrive: Secondary | ICD-10-CM | POA: Insufficient documentation

## 2013-08-20 DIAGNOSIS — D696 Thrombocytopenia, unspecified: Secondary | ICD-10-CM | POA: Diagnosis not present

## 2013-08-20 DIAGNOSIS — I2589 Other forms of chronic ischemic heart disease: Secondary | ICD-10-CM

## 2013-08-20 DIAGNOSIS — Z95810 Presence of automatic (implantable) cardiac defibrillator: Secondary | ICD-10-CM

## 2013-08-20 DIAGNOSIS — C679 Malignant neoplasm of bladder, unspecified: Secondary | ICD-10-CM | POA: Diagnosis not present

## 2013-08-20 DIAGNOSIS — IMO0001 Reserved for inherently not codable concepts without codable children: Secondary | ICD-10-CM | POA: Diagnosis not present

## 2013-08-20 DIAGNOSIS — N184 Chronic kidney disease, stage 4 (severe): Secondary | ICD-10-CM | POA: Diagnosis not present

## 2013-08-21 LAB — CULTURE, BLOOD (ROUTINE X 2): Culture: NO GROWTH

## 2013-08-31 LAB — MDC_IDC_ENUM_SESS_TYPE_REMOTE
Battery Remaining Longevity: 3 mo — CL
Brady Statistic RA Percent Paced: 98 %
Brady Statistic RV Percent Paced: 99 %
HighPow Impedance: 43 Ohm
Lead Channel Impedance Value: 360 Ohm
Lead Channel Impedance Value: 460 Ohm
Lead Channel Sensing Intrinsic Amplitude: 2.5 mV
Lead Channel Setting Pacing Amplitude: 2 V
Lead Channel Setting Pacing Amplitude: 2.5 V
Lead Channel Setting Pacing Amplitude: 2.5 V
Lead Channel Setting Pacing Pulse Width: 0.8 ms
Lead Channel Setting Pacing Pulse Width: 0.8 ms
Zone Setting Detection Interval: 285 ms

## 2013-09-02 DIAGNOSIS — R82998 Other abnormal findings in urine: Secondary | ICD-10-CM | POA: Diagnosis not present

## 2013-09-02 DIAGNOSIS — N184 Chronic kidney disease, stage 4 (severe): Secondary | ICD-10-CM | POA: Diagnosis not present

## 2013-09-02 DIAGNOSIS — R300 Dysuria: Secondary | ICD-10-CM | POA: Diagnosis not present

## 2013-09-06 DIAGNOSIS — I10 Essential (primary) hypertension: Secondary | ICD-10-CM | POA: Diagnosis not present

## 2013-09-10 ENCOUNTER — Encounter: Payer: Self-pay | Admitting: Internal Medicine

## 2013-09-14 ENCOUNTER — Encounter: Payer: Self-pay | Admitting: Internal Medicine

## 2013-09-14 ENCOUNTER — Ambulatory Visit (INDEPENDENT_AMBULATORY_CARE_PROVIDER_SITE_OTHER): Payer: Medicare Other | Admitting: Cardiology

## 2013-09-14 ENCOUNTER — Encounter: Payer: Self-pay | Admitting: Cardiology

## 2013-09-14 VITALS — BP 102/60 | HR 67 | Ht 70.0 in | Wt 190.0 lb

## 2013-09-14 DIAGNOSIS — I5022 Chronic systolic (congestive) heart failure: Secondary | ICD-10-CM | POA: Diagnosis not present

## 2013-09-14 DIAGNOSIS — I2589 Other forms of chronic ischemic heart disease: Secondary | ICD-10-CM

## 2013-09-14 DIAGNOSIS — I255 Ischemic cardiomyopathy: Secondary | ICD-10-CM

## 2013-09-14 DIAGNOSIS — I4891 Unspecified atrial fibrillation: Secondary | ICD-10-CM | POA: Diagnosis not present

## 2013-09-14 DIAGNOSIS — Z95810 Presence of automatic (implantable) cardiac defibrillator: Secondary | ICD-10-CM

## 2013-09-14 DIAGNOSIS — Z4502 Encounter for adjustment and management of automatic implantable cardiac defibrillator: Secondary | ICD-10-CM | POA: Diagnosis not present

## 2013-09-14 LAB — MDC_IDC_ENUM_SESS_TYPE_INCLINIC
Battery Remaining Longevity: 0 mo
Battery Voltage: 2.44 V
Date Time Interrogation Session: 20141202174928
HighPow Impedance: 50 Ohm
Implantable Pulse Generator Serial Number: 528626
Lead Channel Impedance Value: 425 Ohm
Lead Channel Impedance Value: 875 Ohm
Lead Channel Pacing Threshold Amplitude: 0.75 V
Lead Channel Pacing Threshold Amplitude: 1.25 V
Lead Channel Pacing Threshold Pulse Width: 0.5 ms
Lead Channel Pacing Threshold Pulse Width: 0.8 ms
Lead Channel Pacing Threshold Pulse Width: 0.8 ms
Lead Channel Pacing Threshold Pulse Width: 0.8 ms
Lead Channel Pacing Threshold Pulse Width: 0.8 ms
Lead Channel Setting Pacing Amplitude: 2.5 V
Lead Channel Setting Pacing Amplitude: 2.5 V
Lead Channel Setting Pacing Pulse Width: 0.8 ms
Lead Channel Setting Pacing Pulse Width: 0.8 ms
Zone Setting Detection Interval: 240 ms
Zone Setting Detection Interval: 285 ms

## 2013-09-14 NOTE — Progress Notes (Signed)
ELECTROPHYSIOLOGY OFFICE NOTE  Patient ID: RYLAN MINE MRN: [id number], DOB/AGE: 12-28-1931   Date of Visit: 09/14/2013  Primary Physician: [name], MD Primary Cardiologist: Haroldine Laws, MD / Caryl Comes, MD Reason for Visit: EP/device follow-up  History of Present Illness  [name] is a 79 y.o. male with an ischemic CM s/p CRT-D implant, chronic systolic HF, CAD and bladder CA s/p resection and BCG treatment who presents today for routine electrophysiology followup. He is accompanied by his wife. His BiV ICD battery has reached ERI.   Since last being seen in our clinic, he reports he is doing well from a cardiac standpoint and has no cardiac complaints. He and his wife report he has only been limited recently by UTI which he developed after recent routine cystoscopy. He was admitted to [facility] 08/15/2013 - 08/17/2013, hydrated and given IV abx. Urine and blood cultures were negative. He denies recurrent urinary symptoms, fever or chills and completed his course of abx. He has routine surveillance cystoscopies for bladder CA follow-up and he reports no recurrence. From a cardiac standpoint, he has no complaints. He denies chest pain or shortness of breath. He denies palpitations, dizziness, near syncope or syncope. He denies LE swelling, orthopnea, PND or recent weight gain. He is compliant with medications.  Past Medical History Past Medical History  Diagnosis Date  . CAD (coronary artery disease)      a. s/p anterior MI 12/05 c/b shock -> stent LAD   b. s/p stenting OM-1, 2/06  . CHF (congestive heart failure)     due to ischemic CM  a. EF 20-30%. (Nov 2008)   b. s/p St. Jude BiV-ICD    c. CPX 07/2008  pvo2 16.3 (63% predicted) slope 34 RER 1.08 O2 pulse 93%  . Hyperlipidemia   . Hypothyroidism   . GERD (gastroesophageal reflux disease)     hx Barretts esophagitis  . Anemia   . 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  Myocardial infarction 09/15/2004  . History of blood transfusion     following coumadin  usage  . Cough     occasional /productive, no fever/ not new  . ICD (implantable cardiac defibrillator) in place     BiV/ICD [address], Model [id number], Serial # [id number], implanted 01/29/08  . Skin cancer     basal cells   facial x 4, 1 right forearm  . Bladder cancer dx'd 05/2012  . HTN (hypertension)     EKG 05/14/12,Chest x ray 8/13, last ICD interrogation 8/13 EPIC  . Atrial fibrillation or flutter     maintaining sinus on amiodarone    . Sleep apnea     STOP BANG SCORE 4  . Pacemaker   . CRI (chronic renal insufficiency)     (baseline 2.0-2.2)/ recent hospitalization 8/13    Past Surgical History Past Surgical History  Procedure Laterality Date  . Cervical laminectomy  1985  . Back surgery  1972    lower back  . Cardiac defibrillator placement      ICD St Jude  . Hernia repair  1989    bilateral  . Esophagogastroduodenoscopy    . Colonoscopy    . Coronary angioplasty  [id number]  . Transurethral resection of bladder tumor  05/25/2012    cold cup biopsy prostate  . Cystoscopy w/ retrogrades  05/25/2012    Procedure: CYSTOSCOPY WITH RETROGRADE PYELOGRAM;  Surgeon: Molli Hazard, MD;  Location: [facility];  Service: Urology;  Laterality: Bilateral;  . Transurethral resection of bladder tumor  07/08/2012    Procedure: TRANSURETHRAL RESECTION OF BLADDER TUMOR (TURBT);  Surgeon: Molli Hazard, MD;  Location: [facility];  Service: Urology;  Laterality: N/A;  CYSTO, TURBT W/ GYRUS, BILATERAL STENT REMOVAL, LEFT URETEROSCOPY WITH BIOPSY, POSSIBLE LEFT STENT PLACEMENT    . Cystoscopy w/ ureteral stent removal  07/08/2012    Procedure: CYSTOSCOPY WITH STENT REMOVAL;  Surgeon: Molli Hazard, MD;  Location: [facility];  Service: Urology;  Laterality: Bilateral;  . Cystoscopy w/ ureteral stent placement  07/08/2012    Procedure: CYSTOSCOPY WITH STENT REPLACEMENT;  Surgeon: Molli Hazard,  MD;  Location: [facility];  Service: Urology;  Laterality: Left;  . Cystoscopy/retrograde/ureteroscopy  07/08/2012    Procedure: CYSTOSCOPY/RETROGRADE/URETEROSCOPY;  Surgeon: Molli Hazard, MD;  Location: [facility];  Service: Urology;  Laterality: Left;    Allergies/Intolerances Allergies  Allergen Reactions  . Ramipril Other (See Comments)    cough  . Amoxicillin Rash  . Penicillins Rash    Current Home Medications Current Outpatient Prescriptions  Medication Sig Dispense Refill  . acetaminophen (TYLENOL) 325 MG tablet Take 2 tablets (650 mg total) by mouth every 6 (six) hours as needed for mild pain (or Fever >/= 101).      Marland Kitchen aspirin 325 MG tablet Take 325 mg by mouth daily.      . carvedilol (COREG) 6.25 MG tablet TAKE ONE TABLET BY MOUTH TWICE DAILY WITH MEALS  60 tablet  12  . clopidogrel (PLAVIX) 75 MG tablet TAKE ONE TABLET BY MOUTH ONCE DAILY  30 tablet  5  . digoxin (LANOXIN) 0.125 MG tablet Take 0.5 tablets (0.0625 mg total) by mouth daily.  15 tablet  5  . ferrous sulfate ([employer]S IRON) 325 (65 FE) MG tablet Take 325 mg by mouth 3 (three) times a week. 3 times a week      . furosemide (LASIX) 20 MG tablet Take 20 mg by mouth. 3 TIMES A WEEK      . KLOR-CON M10 10 MEQ tablet Take 10 mEq by mouth. 3 TIMES A WEEK      . levothyroxine (SYNTHROID, LEVOTHROID) 50 MCG tablet Take 50 mcg by mouth daily before breakfast.       . Multiple Vitamins-Minerals (CENTRUM SILVER PO) Take 0.5 tablets by mouth 2 (two) times daily.      . nitroGLYCERIN (NITROSTAT) 0.4 MG SL tablet Place 1 tablet (0.4 mg total) under the tongue every 5 (five) minutes as needed. For chest pain  25 tablet  2  . PACERONE 200 MG tablet TAKE ONE-HALF TABLET BY MOUTH EVERY DAY  45 tablet  3  . pantoprazole (PROTONIX) 40 MG tablet Take 1 tablet by mouth daily.       . rosuvastatin (CRESTOR) 20 MG tablet TAKE ONE TABLET BY MOUTH EVERY DAY WITH  SUPPER  30 tablet  5  . senna-docusate (SENOKOT-S) 8.6-50 MG per tablet Take 1  tablet by mouth as needed.      . valsartan (DIOVAN) 80 MG tablet Take 40 mg by mouth daily.      Marland Kitchen ZETIA 10 MG tablet TAKE ONE TABLET BY MOUTH EVERY DAY WITH SUPPER  30 tablet  12   No current facility-administered medications for this visit.    Social History History   Social History  . Marital Status: Married    Spouse Name: N/A    Number of Children: N/A  . Years of Education:  N/A   Occupational History  . retired    Social History Main Topics  . Smoking status: Former Smoker    Types: Cigarettes, Cigars    Quit date: 03/02/2004  . Smokeless tobacco: Former Systems developer    Quit date: 05/20/1999     Comment: quit in 2005  . Alcohol Use: No  . Drug Use: No  . Sexual Activity: Not on file   Other Topics Concern  . Not on file   Social History Narrative  . No narrative on file     Review of Systems General: No chills, fever, night sweats or weight changes Cardiovascular: No chest pain, dyspnea on exertion, edema, orthopnea, palpitations, paroxysmal nocturnal dyspnea Dermatological: No rash, lesions or masses Respiratory: No cough, dyspnea Urologic: No hematuria, dysuria Abdominal: No nausea, vomiting, diarrhea, bright red blood per rectum, melena, or hematemesis Neurologic: No visual changes, weakness, changes in mental status All other systems reviewed and are otherwise negative except as noted above.  Physical Exam Vitals: Blood pressure 102/60, pulse 67, height 5\' 10"  (1.778 m), weight 190 lb (86.183 kg), SpO2 97.00%.  General: Well developed, well appearing 79 y.o. male in no acute distress. HEENT: Normocephalic, atraumatic. EOMs intact. Sclera nonicteric. Oropharynx clear.  Neck: Supple. No JVD. Lungs: Respirations regular and unlabored, CTA bilaterally. No wheezes, rales or rhonchi. Heart: RRR. S1, S2 present. No murmurs, rub, S3 or S4. Abdomen: Soft, non-tender, non-distended. BS present x 4 quadrants. No hepatosplenomegaly.  Extremities: No clubbing, cyanosis or  edema. PT/Radials 2+ and equal bilaterally. Psych: Normal affect. Neuro: Alert and oriented X 3. Moves all extremities spontaneously. Skin: Left upper chest / implant site intact and well healed.   Diagnostics Device interrogation today - BiV ICD battery at ERI since 09/09/2013; RV lead Riata on recall; otherwise normal device function with stable lead measurements; no programming changes made; see PaceArt report for full details  Assessment and Plan 1. CRT-D / BiV ICD battery at ERI 2. RV lead Riata on recall 3. Ischemic CM s/p CRT-D implant 3. Chronic systolic HF 4. CAD 5. Bladder CA s/p resection and BCG treatment  [name] presents for EP follow-up. His BiV ICD battery is at [facility]. Discussed the need for BiV ICD generator change. Also explained that the RV lead will be evaluated with fluoroscopy per FDA recommendations. Risks, benefits and alternatives to BiV ICD generator change were discussed in detail. These risks include, but are not limited to, lead dislodgement, bleeding and infection. As required for NCDR ICD registry, will update echo and ECG. [name] expressed verbal understanding and wishes to proceed. This will be scheduled with Dr. Caryl Comes at his next available time.   Signed, Ileene Hutchinson, PA-C 09/14/2013, 5:28 PM

## 2013-09-14 NOTE — Patient Instructions (Addendum)
Your physician has requested that you have an echocardiogram September 16, 2013 at 4:00 pm. Echocardiography is a painless test that uses sound waves to create images of your heart. It provides your doctor with information about the size and shape of your heart and how well your heart's chambers and valves are working. This procedure takes approximately one hour. There are no restrictions for this procedure.    Your physician has recommended that you have a defibrillator inserted. An implantable cardioverter defibrillator (ICD) GENERATOR CHANGE WITH RV LEAD FLUOROSCOPY is a small device that is placed in your chest or, in rare cases, your abdomen. This device uses electrical pulses or shocks to help control life-threatening, irregular heartbeats that could lead the heart to suddenly stop beating (sudden cardiac arrest). Leads are attached to the ICD that goes into your heart. This is done in the hospital and usually requires an overnight stay. Please see the instruction sheet given to you today for more information.  September 20, 2013 ARRIVE AT 8:30 AM FOR A PROCEDURE AT 10:30 AM.    Your physician recommends that you return for lab work in: TODAY (BMET, CBC, PT-INR)  Your physician has recommended you make the following change in your medication:   [place]

## 2013-09-15 ENCOUNTER — Encounter (HOSPITAL_COMMUNITY): Payer: Self-pay | Admitting: Pharmacy Technician

## 2013-09-15 ENCOUNTER — Telehealth: Payer: Self-pay | Admitting: Cardiology

## 2013-09-15 ENCOUNTER — Encounter: Payer: Self-pay | Admitting: *Deleted

## 2013-09-15 LAB — BASIC METABOLIC PANEL
Calcium: 9.1 mg/dL (ref 8.4–10.5)
GFR: 27.06 mL/min — ABNORMAL LOW (ref >60.00)
Potassium: 4.5 mEq/L (ref 3.5–5.1)
Sodium: 140 mEq/L (ref 135–145)

## 2013-09-15 LAB — CBC WITH DIFFERENTIAL/PLATELET
Basophils Relative: 0.6 % (ref 0.0–3.0)
Eosinophils Absolute: 0.5 10*3/uL (ref 0.0–0.7)
Eosinophils Relative: 5.7 % — ABNORMAL HIGH (ref 0.0–5.0)
Hemoglobin: 11.5 g/dL — ABNORMAL LOW (ref 13.0–17.0)
Lymphocytes Relative: 23.3 % (ref 12.0–46.0)
Monocytes Relative: 9.6 % (ref 3.0–12.0)
Neutro Abs: 5.2 10*3/uL (ref 1.4–7.7)
Neutrophils Relative %: 60.8 % (ref 43.0–77.0)
RBC: 3.98 Mil/uL — ABNORMAL LOW (ref 4.22–5.81)
RDW: 15.5 % — ABNORMAL HIGH (ref 11.5–14.6)

## 2013-09-15 NOTE — Telephone Encounter (Signed)
New message    Saw [name] yesterday--having procedure on Monday---will he need an antibiotic before procedure?

## 2013-09-16 ENCOUNTER — Ambulatory Visit (HOSPITAL_COMMUNITY): Payer: Medicare Other | Attending: Cardiology | Admitting: Radiology

## 2013-09-16 ENCOUNTER — Other Ambulatory Visit (HOSPITAL_COMMUNITY): Payer: Self-pay | Admitting: Radiology

## 2013-09-16 ENCOUNTER — Other Ambulatory Visit: Payer: Self-pay | Admitting: Internal Medicine

## 2013-09-16 ENCOUNTER — Encounter: Payer: Self-pay | Admitting: Cardiology

## 2013-09-16 ENCOUNTER — Other Ambulatory Visit (HOSPITAL_COMMUNITY): Payer: Self-pay | Admitting: Internal Medicine

## 2013-09-16 DIAGNOSIS — I252 Old myocardial infarction: Secondary | ICD-10-CM | POA: Diagnosis not present

## 2013-09-16 DIAGNOSIS — I359 Nonrheumatic aortic valve disorder, unspecified: Secondary | ICD-10-CM | POA: Diagnosis not present

## 2013-09-16 DIAGNOSIS — I2510 Atherosclerotic heart disease of native coronary artery without angina pectoris: Secondary | ICD-10-CM | POA: Insufficient documentation

## 2013-09-16 DIAGNOSIS — I509 Heart failure, unspecified: Secondary | ICD-10-CM | POA: Diagnosis not present

## 2013-09-16 DIAGNOSIS — I2589 Other forms of chronic ischemic heart disease: Secondary | ICD-10-CM

## 2013-09-16 DIAGNOSIS — I5022 Chronic systolic (congestive) heart failure: Secondary | ICD-10-CM

## 2013-09-16 DIAGNOSIS — Z950 Presence of cardiac pacemaker: Secondary | ICD-10-CM | POA: Insufficient documentation

## 2013-09-16 DIAGNOSIS — I428 Other cardiomyopathies: Secondary | ICD-10-CM | POA: Insufficient documentation

## 2013-09-16 DIAGNOSIS — I10 Essential (primary) hypertension: Secondary | ICD-10-CM | POA: Diagnosis not present

## 2013-09-16 DIAGNOSIS — Z87891 Personal history of nicotine dependence: Secondary | ICD-10-CM | POA: Insufficient documentation

## 2013-09-16 DIAGNOSIS — C639 Malignant neoplasm of male genital organ, unspecified: Secondary | ICD-10-CM | POA: Insufficient documentation

## 2013-09-16 DIAGNOSIS — I059 Rheumatic mitral valve disease, unspecified: Secondary | ICD-10-CM | POA: Insufficient documentation

## 2013-09-16 DIAGNOSIS — I079 Rheumatic tricuspid valve disease, unspecified: Secondary | ICD-10-CM | POA: Insufficient documentation

## 2013-09-16 DIAGNOSIS — I4891 Unspecified atrial fibrillation: Secondary | ICD-10-CM | POA: Diagnosis not present

## 2013-09-16 DIAGNOSIS — R079 Chest pain, unspecified: Secondary | ICD-10-CM

## 2013-09-16 NOTE — Progress Notes (Signed)
Echocardiogram performed.  

## 2013-09-17 NOTE — Telephone Encounter (Signed)
Called pt and informed that [facility] don't think he needs to have antibiotic before his procedure. Pt agreed.

## 2013-09-18 DIAGNOSIS — R040 Epistaxis: Secondary | ICD-10-CM | POA: Diagnosis not present

## 2013-09-18 DIAGNOSIS — Z6828 Body mass index (BMI) 28.0-28.9, adult: Secondary | ICD-10-CM | POA: Diagnosis not present

## 2013-09-18 DIAGNOSIS — D696 Thrombocytopenia, unspecified: Secondary | ICD-10-CM | POA: Diagnosis not present

## 2013-09-18 DIAGNOSIS — N390 Urinary tract infection, site not specified: Secondary | ICD-10-CM | POA: Diagnosis not present

## 2013-09-18 DIAGNOSIS — C679 Malignant neoplasm of bladder, unspecified: Secondary | ICD-10-CM | POA: Diagnosis not present

## 2013-09-18 DIAGNOSIS — N184 Chronic kidney disease, stage 4 (severe): Secondary | ICD-10-CM | POA: Diagnosis not present

## 2013-09-18 DIAGNOSIS — R627 Adult failure to thrive: Secondary | ICD-10-CM | POA: Diagnosis not present

## 2013-09-18 DIAGNOSIS — I10 Essential (primary) hypertension: Secondary | ICD-10-CM | POA: Diagnosis not present

## 2013-09-19 DIAGNOSIS — N189 Chronic kidney disease, unspecified: Secondary | ICD-10-CM | POA: Diagnosis not present

## 2013-09-19 DIAGNOSIS — Z87891 Personal history of nicotine dependence: Secondary | ICD-10-CM | POA: Diagnosis not present

## 2013-09-19 DIAGNOSIS — E039 Hypothyroidism, unspecified: Secondary | ICD-10-CM | POA: Diagnosis not present

## 2013-09-19 DIAGNOSIS — I4891 Unspecified atrial fibrillation: Secondary | ICD-10-CM | POA: Diagnosis not present

## 2013-09-19 DIAGNOSIS — K219 Gastro-esophageal reflux disease without esophagitis: Secondary | ICD-10-CM | POA: Diagnosis not present

## 2013-09-19 DIAGNOSIS — I252 Old myocardial infarction: Secondary | ICD-10-CM | POA: Diagnosis not present

## 2013-09-19 DIAGNOSIS — E785 Hyperlipidemia, unspecified: Secondary | ICD-10-CM | POA: Diagnosis not present

## 2013-09-19 DIAGNOSIS — Z4502 Encounter for adjustment and management of automatic implantable cardiac defibrillator: Secondary | ICD-10-CM | POA: Diagnosis not present

## 2013-09-19 DIAGNOSIS — I129 Hypertensive chronic kidney disease with stage 1 through stage 4 chronic kidney disease, or unspecified chronic kidney disease: Secondary | ICD-10-CM | POA: Diagnosis not present

## 2013-09-19 DIAGNOSIS — I5032 Chronic diastolic (congestive) heart failure: Secondary | ICD-10-CM | POA: Diagnosis not present

## 2013-09-19 DIAGNOSIS — Z01812 Encounter for preprocedural laboratory examination: Secondary | ICD-10-CM | POA: Diagnosis not present

## 2013-09-19 DIAGNOSIS — I2510 Atherosclerotic heart disease of native coronary artery without angina pectoris: Secondary | ICD-10-CM | POA: Diagnosis not present

## 2013-09-19 DIAGNOSIS — I2589 Other forms of chronic ischemic heart disease: Secondary | ICD-10-CM | POA: Diagnosis not present

## 2013-09-19 DIAGNOSIS — I509 Heart failure, unspecified: Secondary | ICD-10-CM | POA: Diagnosis not present

## 2013-09-19 DIAGNOSIS — Z8551 Personal history of malignant neoplasm of bladder: Secondary | ICD-10-CM | POA: Diagnosis not present

## 2013-09-19 DIAGNOSIS — C44319 Basal cell carcinoma of skin of other parts of face: Secondary | ICD-10-CM | POA: Diagnosis not present

## 2013-09-19 DIAGNOSIS — R259 Unspecified abnormal involuntary movements: Secondary | ICD-10-CM | POA: Diagnosis not present

## 2013-09-19 MED ORDER — SODIUM CHLORIDE 0.9 % IR SOLN
80.0000 mg | Status: AC
  Filled 2013-09-19: qty 2

## 2013-09-19 MED ORDER — VANCOMYCIN HCL IN DEXTROSE 1-5 GM/200ML-% IV SOLN
1000.0000 mg | INTRAVENOUS | Status: AC
  Filled 2013-09-19: qty 200

## 2013-09-20 ENCOUNTER — Ambulatory Visit (HOSPITAL_COMMUNITY)
Admission: RE | Admit: 2013-09-20 | Discharge: 2013-09-20 | Disposition: A | Discharge status: Home / Self Care | Payer: Medicare Other | Source: Ambulatory Visit | Attending: Internal Medicine | Admitting: Internal Medicine

## 2013-09-20 ENCOUNTER — Encounter (HOSPITAL_COMMUNITY)
Admission: RE | Disposition: A | Discharge status: Home / Self Care | Payer: Self-pay | Source: Ambulatory Visit | Attending: Internal Medicine

## 2013-09-20 DIAGNOSIS — Z8551 Personal history of malignant neoplasm of bladder: Secondary | ICD-10-CM | POA: Insufficient documentation

## 2013-09-20 DIAGNOSIS — Z87891 Personal history of nicotine dependence: Secondary | ICD-10-CM | POA: Insufficient documentation

## 2013-09-20 DIAGNOSIS — I2589 Other forms of chronic ischemic heart disease: Secondary | ICD-10-CM | POA: Insufficient documentation

## 2013-09-20 DIAGNOSIS — C44319 Basal cell carcinoma of skin of other parts of face: Secondary | ICD-10-CM | POA: Insufficient documentation

## 2013-09-20 DIAGNOSIS — E785 Hyperlipidemia, unspecified: Secondary | ICD-10-CM | POA: Insufficient documentation

## 2013-09-20 DIAGNOSIS — R259 Unspecified abnormal involuntary movements: Secondary | ICD-10-CM | POA: Insufficient documentation

## 2013-09-20 DIAGNOSIS — I509 Heart failure, unspecified: Secondary | ICD-10-CM | POA: Diagnosis not present

## 2013-09-20 DIAGNOSIS — I4891 Unspecified atrial fibrillation: Secondary | ICD-10-CM | POA: Insufficient documentation

## 2013-09-20 DIAGNOSIS — I252 Old myocardial infarction: Secondary | ICD-10-CM | POA: Insufficient documentation

## 2013-09-20 DIAGNOSIS — Z01812 Encounter for preprocedural laboratory examination: Secondary | ICD-10-CM | POA: Diagnosis not present

## 2013-09-20 DIAGNOSIS — N189 Chronic kidney disease, unspecified: Secondary | ICD-10-CM | POA: Insufficient documentation

## 2013-09-20 DIAGNOSIS — Z4502 Encounter for adjustment and management of automatic implantable cardiac defibrillator: Secondary | ICD-10-CM | POA: Insufficient documentation

## 2013-09-20 DIAGNOSIS — K219 Gastro-esophageal reflux disease without esophagitis: Secondary | ICD-10-CM | POA: Insufficient documentation

## 2013-09-20 DIAGNOSIS — I2510 Atherosclerotic heart disease of native coronary artery without angina pectoris: Secondary | ICD-10-CM | POA: Insufficient documentation

## 2013-09-20 DIAGNOSIS — I129 Hypertensive chronic kidney disease with stage 1 through stage 4 chronic kidney disease, or unspecified chronic kidney disease: Secondary | ICD-10-CM | POA: Insufficient documentation

## 2013-09-20 DIAGNOSIS — I5032 Chronic diastolic (congestive) heart failure: Secondary | ICD-10-CM | POA: Insufficient documentation

## 2013-09-20 DIAGNOSIS — E039 Hypothyroidism, unspecified: Secondary | ICD-10-CM | POA: Insufficient documentation

## 2013-09-20 HISTORY — PX: IMPLANTABLE CARDIOVERTER DEFIBRILLATOR (ICD) GENERATOR CHANGE: SHX5469

## 2013-09-20 LAB — BASIC METABOLIC PANEL
BUN: 30 mg/dL — ABNORMAL HIGH (ref 6–23)
CO2: 24 mEq/L (ref 19–32)
GFR calc non Af Amer: 26 mL/min — ABNORMAL LOW (ref >90)
Glucose, Bld: 108 mg/dL — ABNORMAL HIGH (ref 70–99)
Potassium: 4.6 mEq/L (ref 3.5–5.1)

## 2013-09-20 LAB — SURGICAL PCR SCREEN: Staphylococcus aureus: NEGATIVE

## 2013-09-20 SURGERY — ICD GENERATOR CHANGE
Anesthesia: LOCAL

## 2013-09-20 MED ORDER — CHLORHEXIDINE GLUCONATE 4 % EX LIQD
60.0000 mL | Freq: Once | CUTANEOUS | Status: DC
  Filled 2013-09-20: qty 60

## 2013-09-20 MED ORDER — LIDOCAINE HCL (PF) 1 % IJ SOLN
INTRAMUSCULAR | Status: AC
  Filled 2013-09-20: qty 60

## 2013-09-20 MED ORDER — FENTANYL CITRATE 0.05 MG/ML IJ SOLN
INTRAMUSCULAR | Status: AC
  Filled 2013-09-20: qty 2

## 2013-09-20 MED ORDER — MUPIROCIN 2 % EX OINT
TOPICAL_OINTMENT | Freq: Two times a day (BID) | CUTANEOUS | Status: DC
  Administered 2013-09-20: 1 via NASAL
  Filled 2013-09-20: qty 22

## 2013-09-20 MED ORDER — ONDANSETRON HCL 4 MG/2ML IJ SOLN: 4.0000 mg | Freq: Four times a day (QID) | INTRAMUSCULAR | Status: DC | PRN

## 2013-09-20 MED ORDER — MIDAZOLAM HCL 5 MG/5ML IJ SOLN
INTRAMUSCULAR | Status: AC
  Filled 2013-09-20: qty 5

## 2013-09-20 MED ORDER — SODIUM CHLORIDE 0.9 % IV SOLN
INTRAVENOUS | Status: DC
  Administered 2013-09-20: 09:00:00 via INTRAVENOUS

## 2013-09-20 MED ORDER — ACETAMINOPHEN 325 MG PO TABS
325.0000 mg | ORAL_TABLET | ORAL | Status: DC | PRN
Start: 2013-09-20 — End: 2013-09-20
  Filled 2013-09-20: qty 2

## 2013-09-20 MED ORDER — SODIUM CHLORIDE 0.9 % IV SOLN: INTRAVENOUS | Status: DC

## 2013-09-20 MED ORDER — MUPIROCIN 2 % EX OINT
TOPICAL_OINTMENT | CUTANEOUS | Status: AC
  Filled 2013-09-20: qty 22

## 2013-09-20 NOTE — Interval H&P Note (Signed)
History and Physical Interval Note:  09/20/2013 10:04 AM  [name]  has presented today for surgery, with the diagnosis of biv icd eol  The various methods of treatment have been discussed with the patient and family. After consideration of risks, benefits and other options for treatment, the patient has consented to  Procedure(s): ICD GENERATOR CHANGE (N/A) as a surgical intervention .  The patient's history has been reviewed, patient examined, no change in status, stable for surgery.  I have reviewed the patient's chart and labs.  Questions were answered to the patient's satisfaction.     Virl Axe  ICD Criteria  Current LVEF (within 6 months):20-25 %  NYHA Functional Classification: Class III  Heart Failure History:  Yes, Duration of heart failure since onset is > 9 months  Non-Ischemic Dilated Cardiomyopathy History:  No.  Atrial Fibrillation/Atrial Flutter:  Yes, A-Fib/A-Flutter type: Paroxysmal.  Ventricular Tachycardia History:  No.  Cardiac Arrest History:  No  History of Syndromes with Risk of Sudden Death:  No.  Previous ICD:  Yes, ICD Type:  CRT-D, Reason for ICD:  Primary prevention.  LVEF is not available  Electrophysiology Study: No.  Prior MI: Yes, Most recent MI timeframe is > 40 days.  PPM: No.  OSA:  No  Patient Life Expectancy of >=1 year: Yes.  Anticoagulation Therapy:  Patient is NOT on anticoagulation therapy.   Beta Blocker Therapy:  Yes.   Ace Inhibitor/ARB Therapy:  Yes.

## 2013-09-20 NOTE — H&P (View-Only) (Signed)
ELECTROPHYSIOLOGY OFFICE NOTE  Patient ID: [name] MRN: [id number], DOB/AGE: 11/29/1931   Date of Visit: 09/14/2013  Primary Physician: [name], MD Primary Cardiologist: Haroldine Laws, MD / Caryl Comes, MD Reason for Visit: EP/device follow-up  History of Present Illness  [name] is a 79 y.o. male with an ischemic CM s/p CRT-D implant, chronic systolic HF, CAD and bladder CA s/p resection and BCG treatment who presents today for routine electrophysiology followup. He is accompanied by his wife. His BiV ICD battery has reached ERI.   Since last being seen in our clinic, he reports he is doing well from a cardiac standpoint and has no cardiac complaints. He and his wife report he has only been limited recently by UTI which he developed after recent routine cystoscopy. He was admitted to [facility] 08/15/2013 - 08/17/2013, hydrated and given IV abx. Urine and blood cultures were negative. He denies recurrent urinary symptoms, fever or chills and completed his course of abx. He has routine surveillance cystoscopies for bladder CA follow-up and he reports no recurrence. From a cardiac standpoint, he has no complaints. He denies chest pain or shortness of breath. He denies palpitations, dizziness, near syncope or syncope. He denies LE swelling, orthopnea, PND or recent weight gain. He is compliant with medications.  Past Medical History Past Medical History  Diagnosis Date  . CAD (coronary artery disease)      a. s/p anterior MI 12/05 c/b shock -> stent LAD   b. s/p stenting OM-1, 2/06  . CHF (congestive heart failure)     due to ischemic CM  a. EF 20-30%. (Nov 2008)   b. s/p St. Jude BiV-ICD    c. CPX 07/2008  pvo2 16.3 (63% predicted) slope 34 RER 1.08 O2 pulse 93%  . Hyperlipidemia   . Hypothyroidism   . GERD (gastroesophageal reflux disease)     hx Barretts esophagitis  . Anemia   . 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  Myocardial infarction 09/15/2004  . History of blood transfusion     following coumadin  usage  . Cough     occasional /productive, no fever/ not new  . ICD (implantable cardiac defibrillator) in place     BiV/ICD [address], Model [id number], Serial # [id number], implanted 01/29/08  . Skin cancer     basal cells   facial x 4, 1 right forearm  . Bladder cancer dx'd 05/2012  . HTN (hypertension)     EKG 05/14/12,Chest x ray 8/13, last ICD interrogation 8/13 EPIC  . Atrial fibrillation or flutter     maintaining sinus on amiodarone    . Sleep apnea     STOP BANG SCORE 4  . Pacemaker   . CRI (chronic renal insufficiency)     (baseline 2.0-2.2)/ recent hospitalization 8/13    Past Surgical History Past Surgical History  Procedure Laterality Date  . Cervical laminectomy  1985  . Back surgery  1972    lower back  . Cardiac defibrillator placement      ICD St Jude  . Hernia repair  1989    bilateral  . Esophagogastroduodenoscopy    . Colonoscopy    . Coronary angioplasty  [id number]  . Transurethral resection of bladder tumor  05/25/2012    cold cup biopsy prostate  . Cystoscopy w/ retrogrades  05/25/2012    Procedure: CYSTOSCOPY WITH RETROGRADE PYELOGRAM;  Surgeon: Molli Hazard, MD;  Location: [facility];  Service: Urology;  Laterality: Bilateral;  . Transurethral resection of bladder tumor  07/08/2012    Procedure: TRANSURETHRAL RESECTION OF BLADDER TUMOR (TURBT);  Surgeon: Molli Hazard, MD;  Location: [facility];  Service: Urology;  Laterality: N/A;  CYSTO, TURBT W/ GYRUS, BILATERAL STENT REMOVAL, LEFT URETEROSCOPY WITH BIOPSY, POSSIBLE LEFT STENT PLACEMENT    . Cystoscopy w/ ureteral stent removal  07/08/2012    Procedure: CYSTOSCOPY WITH STENT REMOVAL;  Surgeon: Molli Hazard, MD;  Location: [facility];  Service: Urology;  Laterality: Bilateral;  . Cystoscopy w/ ureteral stent placement  07/08/2012    Procedure: CYSTOSCOPY WITH STENT REPLACEMENT;  Surgeon: Molli Hazard,  MD;  Location: [facility];  Service: Urology;  Laterality: Left;  . Cystoscopy/retrograde/ureteroscopy  07/08/2012    Procedure: CYSTOSCOPY/RETROGRADE/URETEROSCOPY;  Surgeon: Molli Hazard, MD;  Location: [facility];  Service: Urology;  Laterality: Left;    Allergies/Intolerances Allergies  Allergen Reactions  . Ramipril Other (See Comments)    cough  . Amoxicillin Rash  . Penicillins Rash    Current Home Medications Current Outpatient Prescriptions  Medication Sig Dispense Refill  . acetaminophen (TYLENOL) 325 MG tablet Take 2 tablets (650 mg total) by mouth every 6 (six) hours as needed for mild pain (or Fever >/= 101).      Marland Kitchen aspirin 325 MG tablet Take 325 mg by mouth daily.      . carvedilol (COREG) 6.25 MG tablet TAKE ONE TABLET BY MOUTH TWICE DAILY WITH MEALS  60 tablet  12  . clopidogrel (PLAVIX) 75 MG tablet TAKE ONE TABLET BY MOUTH ONCE DAILY  30 tablet  5  . digoxin (LANOXIN) 0.125 MG tablet Take 0.5 tablets (0.0625 mg total) by mouth daily.  15 tablet  5  . ferrous sulfate (CVS IRON) 325 (65 FE) MG tablet Take 325 mg by mouth 3 (three) times a week. 3 times a week      . furosemide (LASIX) 20 MG tablet Take 20 mg by mouth. 3 TIMES A WEEK      . KLOR-CON M10 10 MEQ tablet Take 10 mEq by mouth. 3 TIMES A WEEK      . levothyroxine (SYNTHROID, LEVOTHROID) 50 MCG tablet Take 50 mcg by mouth daily before breakfast.       . Multiple Vitamins-Minerals (CENTRUM SILVER PO) Take 0.5 tablets by mouth 2 (two) times daily.      . nitroGLYCERIN (NITROSTAT) 0.4 MG SL tablet Place 1 tablet (0.4 mg total) under the tongue every 5 (five) minutes as needed. For chest pain  25 tablet  2  . PACERONE 200 MG tablet TAKE ONE-HALF TABLET BY MOUTH EVERY DAY  45 tablet  3  . pantoprazole (PROTONIX) 40 MG tablet Take 1 tablet by mouth daily.       . rosuvastatin (CRESTOR) 20 MG tablet TAKE ONE TABLET BY MOUTH EVERY DAY WITH  SUPPER  30 tablet  5  . senna-docusate (SENOKOT-S) 8.6-50 MG per tablet Take 1  tablet by mouth as needed.      . valsartan (DIOVAN) 80 MG tablet Take 40 mg by mouth daily.      Marland Kitchen ZETIA 10 MG tablet TAKE ONE TABLET BY MOUTH EVERY DAY WITH SUPPER  30 tablet  12   No current facility-administered medications for this visit.    Social History History   Social History  . Marital Status: Married    Spouse Name: N/A    Number of Children: N/A  . Years of Education:  N/A   Occupational History  . retired    Social History Main Topics  . Smoking status: Former Smoker    Types: Cigarettes, Cigars    Quit date: 03/02/2004  . Smokeless tobacco: Former Systems developer    Quit date: 05/20/1999     Comment: quit in 2005  . Alcohol Use: No  . Drug Use: No  . Sexual Activity: Not on file   Other Topics Concern  . Not on file   Social History Narrative  . No narrative on file     Review of Systems General: No chills, fever, night sweats or weight changes Cardiovascular: No chest pain, dyspnea on exertion, edema, orthopnea, palpitations, paroxysmal nocturnal dyspnea Dermatological: No rash, lesions or masses Respiratory: No cough, dyspnea Urologic: No hematuria, dysuria Abdominal: No nausea, vomiting, diarrhea, bright red blood per rectum, melena, or hematemesis Neurologic: No visual changes, weakness, changes in mental status All other systems reviewed and are otherwise negative except as noted above.  Physical Exam Vitals: Blood pressure 102/60, pulse 67, height 5\' 10"  (1.778 m), weight 190 lb (86.183 kg), SpO2 97.00%.  General: Well developed, well appearing 79 y.o. male in no acute distress. HEENT: Normocephalic, atraumatic. EOMs intact. Sclera nonicteric. Oropharynx clear.  Neck: Supple. No JVD. Lungs: Respirations regular and unlabored, CTA bilaterally. No wheezes, rales or rhonchi. Heart: RRR. S1, S2 present. No murmurs, rub, S3 or S4. Abdomen: Soft, non-tender, non-distended. BS present x 4 quadrants. No hepatosplenomegaly.  Extremities: No clubbing, cyanosis or  edema. PT/Radials 2+ and equal bilaterally. Psych: Normal affect. Neuro: Alert and oriented X 3. Moves all extremities spontaneously. Skin: Left upper chest / implant site intact and well healed.   Diagnostics Device interrogation today - BiV ICD battery at ERI since 09/09/2013; RV lead Riata on recall; otherwise normal device function with stable lead measurements; no programming changes made; see PaceArt report for full details  Assessment and Plan 1. CRT-D / BiV ICD battery at ERI 2. RV lead Riata on recall 3. Ischemic CM s/p CRT-D implant 3. Chronic systolic HF 4. CAD 5. Bladder CA s/p resection and BCG treatment  Mr. Debnam presents for EP follow-up. His BiV ICD battery is at [facility]. Discussed the need for BiV ICD generator change. Also explained that the RV lead will be evaluated with fluoroscopy per FDA recommendations. Risks, benefits and alternatives to BiV ICD generator change were discussed in detail. These risks include, but are not limited to, lead dislodgement, bleeding and infection. As required for NCDR ICD registry, will update echo and ECG. Mr. Pitter expressed verbal understanding and wishes to proceed. This will be scheduled with Dr. Caryl Comes at his next available time.   Signed, Ileene Hutchinson, PA-C 09/14/2013, 5:28 PM

## 2013-09-20 NOTE — CV Procedure (Addendum)
Preoperative diagnosis icm CHD prev CRT-D now at [facility] Postoperative diagnosis same/   Procedure: Generator replacement     Following informed consent the patient was brought to the electrophysiology laboratory in place of the fluoroscopic table in the supine position after routine prep and drape lidocaine was infiltrated in the region of the previous incision and carried down to later the device pocket using sharp dissection and electrocautery. The pocket was opened the device was freed up and was explanted.  Interrogation of the previously implanted ICD ventricular lead  St Jude [id number]  demonstrated an R wave of  39millivolts., and impedance of 410 ohms, and a pacing threshold of 1.3 volts at 0.5 msec.  pacign impedances through the HV leads were 250-400Ohms     Interrogation of the previously implanted Left ventricular lead St Jude 1156  Demonstrated  and impedance of 780 ohms, and a pacing threshold of 1.8 volts at 0.5 msec.    The previously implanted atrial lead Medtronic demonstrated a P-wave amplitude of 3.5 milllivolts  and impedance of  420 ohms, and a pacing threshold of 0.8 volts at @ 0.108milliseconds.  Leads were attached to a St Jude  pulse generator, serial number [id number].     High voltage impedances were 48 ohms  The pocket was irrigated with antibiotic containing saline solution hemostasis was assured and the leads and the device were placed in the pocket. The wound was then closed in 3 layers in normal fashion.  The patient tolerated the procedure without apparent complication.  DFT testing was not performed  Virl Axe   \  flouroscopy normal lead appearance

## 2013-09-21 ENCOUNTER — Encounter (HOSPITAL_COMMUNITY): Payer: Self-pay | Admitting: *Deleted

## 2013-09-21 ENCOUNTER — Encounter: Payer: Self-pay | Admitting: *Deleted

## 2013-09-21 ENCOUNTER — Telehealth: Payer: Self-pay | Admitting: Internal Medicine

## 2013-09-21 NOTE — Telephone Encounter (Signed)
Concerned there were no steri strips in place - I explained that there was skin glue instead. Also advised them to keep area open, no bandage necessary, per their questions. Patient and wife verbalized understanding of instructions and agreeable to plan.

## 2013-09-21 NOTE — Telephone Encounter (Signed)
New message  Patient's wife took bandage off and she is concerned that it looks very exposed. Should it be covered up? Please call and advise.

## 2013-09-23 ENCOUNTER — Other Ambulatory Visit (HOSPITAL_COMMUNITY): Payer: Medicare Other

## 2013-09-30 ENCOUNTER — Ambulatory Visit (HOSPITAL_COMMUNITY)
Admission: RE | Admit: 2013-09-30 | Discharge: 2013-09-30 | Disposition: A | Discharge status: Home / Self Care | Payer: Medicare Other | Source: Ambulatory Visit | Attending: Internal Medicine | Admitting: Internal Medicine

## 2013-09-30 ENCOUNTER — Encounter (HOSPITAL_COMMUNITY): Payer: Self-pay

## 2013-09-30 VITALS — BP 108/58 | HR 91 | Resp 18 | Wt 182.0 lb

## 2013-09-30 DIAGNOSIS — I509 Heart failure, unspecified: Secondary | ICD-10-CM | POA: Diagnosis not present

## 2013-09-30 DIAGNOSIS — I2510 Atherosclerotic heart disease of native coronary artery without angina pectoris: Secondary | ICD-10-CM | POA: Diagnosis not present

## 2013-09-30 DIAGNOSIS — I4891 Unspecified atrial fibrillation: Secondary | ICD-10-CM

## 2013-09-30 DIAGNOSIS — I5022 Chronic systolic (congestive) heart failure: Secondary | ICD-10-CM | POA: Diagnosis not present

## 2013-09-30 LAB — COMPREHENSIVE METABOLIC PANEL
ALT: 12 U/L (ref 0–53)
AST: 18 U/L (ref 0–37)
Albumin: 3.6 g/dL (ref 3.5–5.2)
Alkaline Phosphatase: 64 U/L (ref 39–117)
CO2: 25 mEq/L (ref 19–32)
Chloride: 108 mEq/L (ref 96–112)
Creatinine, Ser: 2.46 mg/dL — ABNORMAL HIGH (ref 0.50–1.35)
GFR calc non Af Amer: 23 mL/min — ABNORMAL LOW (ref >90)
Potassium: 4.8 mEq/L (ref 3.5–5.1)
Sodium: 142 mEq/L (ref 135–145)
Total Bilirubin: 0.3 mg/dL (ref 0.3–1.2)

## 2013-09-30 NOTE — Progress Notes (Signed)
Patient ID: [name], male   DOB: 08-17-1932, 79 y.o.   MRN: [id number] Urologist: Dr [name] HPI:  [name] is delightful 79 year old male with a history of coronary artery disease, status post previous large anterior wall myocardial infarction s/p multivessel stenting, systolic CHF with EF 123456 s/p St. Jude BiV ICD.  Remainder of his medical history is notable for atrial fibrillation,maintaining sinus rhythm on amiodarone.  He is not on Coumadin secondary to GI bleed, chronic renal insufficiency with baseline creatinine about 2.2-2.5, hypertension, hyperlipidemia, and a systemic tremor.Bladder cancer 05/2012. Completed BCG treatment.   In 2012 had NYHA III-IIIB symptoms. We discussed possibility of RHC and inotropes but he wanted to defer.   ECHO 02/18/13 EF 25%  He returns for follow up. In November 2014 admitted for urosepsis after cystoscopy (which showed no residual bladder CA). During that admission creatinine peaked at 2.9. Valsartan stopped - had it restarted at half dose 940 mg daily) and holds with SBP < 100. Had ICD generator replaced last week. Weighs every day  Weight at home ~ 173 (lost 5 pounds) Compliant with medications. Using lasix three times per week with KCL.  Takes extra if needed but hasn't had to. Continues with NYHA Class II-III fatigue/dyspnea. No edema, orthopnea, PND.   ROS: All systems negative except as listed in HPI, PMH and Problem List.  Past Medical History  Diagnosis Date  . CAD (coronary artery disease)      a. s/p anterior MI 12/05 c/b shock -> stent LAD   b. s/p stenting OM-1, 2/06  . CHF (congestive heart failure)     due to ischemic CM  a. EF 20-30%. (Nov 2008)   b. s/p St. Jude BiV-ICD    c. CPX 07/2008  pvo2 16.3 (63% predicted) slope 34 RER 1.08 O2 pulse 93%  . Hyperlipidemia   . Hypothyroidism   . GERD (gastroesophageal reflux disease)     hx Barretts esophagitis  . Anemia   . Neuromuscular disorder     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"    no neurologist  . History of blood transfusion     following coumadin  usage  . Cough     occasional /productive, no fever/ not new  . Skin cancer     basal cells   facial x 4, 1 right forearm  . Bladder cancer dx'd 05/2012  . HTN (hypertension)     EKG 05/14/12,Chest x ray 8/13, last ICD interrogation 8/13 EPIC  . Atrial fibrillation or flutter     maintaining sinus on amiodarone    . Sleep apnea     STOP BANG SCORE 4  . CRI (chronic renal insufficiency)     (baseline 2.0-2.2)/ recent hospitalization 8/13    Current Outpatient Prescriptions  Medication Sig Dispense Refill  . acetaminophen (TYLENOL) 325 MG tablet Take 2 tablets (650 mg total) by mouth every 6 (six) hours as needed for mild pain (or Fever >/= 101).      Marland Kitchen aspirin 325 MG tablet Take 325 mg by mouth daily.      . carvedilol (COREG) 6.25 MG tablet Take 6.25 mg by mouth 2 (two) times daily with a meal.      . clopidogrel (PLAVIX) 75 MG tablet Take 75 mg by mouth daily.      . digoxin (LANOXIN) 0.125 MG tablet Take 0.5 tablets (0.0625 mg total) by mouth daily.  15 tablet  5  . ezetimibe (ZETIA) 10 MG tablet Take 10 mg by mouth daily.      Marland Kitchen  ferrous sulfate ([employer] IRON) 325 (65 FE) MG tablet Take 325 mg by mouth every Monday, Wednesday, and Friday.       . furosemide (LASIX) 20 MG tablet Take 20 mg by mouth every Monday, Wednesday, and Friday.       Marland Kitchen KLOR-CON M10 10 MEQ tablet Take 10 mEq by mouth every Monday, Wednesday, and Friday.       . levothyroxine (SYNTHROID, LEVOTHROID) 50 MCG tablet Take 50 mcg by mouth daily before breakfast.       . Magnesium Hydroxide (MILK OF MAGNESIA PO) Take 20 mLs by mouth daily as needed (for constipation).      . Multiple Vitamins-Minerals (CENTRUM SILVER PO) Take 0.5 tablets by mouth 2 (two) times daily.      . nitroGLYCERIN (NITROSTAT) 0.4 MG SL tablet Place 1 tablet (0.4 mg total) under the tongue every 5 (five) minutes as needed. For chest pain  25 tablet  2  . PACERONE 200 MG tablet TAKE  ONE-HALF TABLET BY MOUTH EVERY DAY  45 tablet  3  . pantoprazole (PROTONIX) 40 MG tablet Take 40 mg by mouth daily.       . rosuvastatin (CRESTOR) 20 MG tablet Take 20 mg by mouth daily.      . valsartan (DIOVAN) 80 MG tablet Take 40 mg by mouth daily. Only if SBP > 100       No current facility-administered medications for this encounter.     PHYSICAL EXAM: Filed Vitals:   09/30/13 1101  BP: 108/58  Pulse: 91  Resp: 18    General:  Elderly.   no resp difficulty (wife present)  HEENT: normal Neck: supple. JVP 6-7 Carotids 2+ bilat; no bruits. Cor: PMI laterally displaced. Regular rate & rhythm. No rubs, gallops, 2/6 SEM murmur at apex. ICD site with mild swelling over (evalauted by EP nurse) Lungs: clear with decreased air movement (mild) Abdomen: soft, nontender, distended  No hepatosplenomegaly. No bruits or masses. Good bowel sounds. Extremities: no cyanosis, clubbing, rash, edema. +vericose veins.  Neuro: alert & orientedx3, cranial nerves grossly intact. moves all 4 extremities w/o difficulty. affect pleasant    ASSESSMENT & PLAN: 1. Chronic systolic CHF    --Doing very well. Stable NYHA II-III. Agree with current medicine regimen. BP too low to advance further.    --Will check BMET today. If K > 45.0 will stop supplemental potassium  2. CAD     --No evidence of ischemia. Continue current regimen.  3. PAF       --Maintaining SR on low dose amio. Not on coumadin due to h/o GIB. Will check TFT, LFTs.   Laytoya Ion,MD 11:26 AM

## 2013-09-30 NOTE — Patient Instructions (Signed)
Labs today  We will contact you in 4 months to schedule your next appointment.  

## 2013-09-30 NOTE — Addendum Note (Signed)
Encounter addended by: Scarlette Calico, RN on: 09/30/2013 11:31 AM<BR>     Documentation filed: Patient Instructions Section, Orders

## 2013-10-11 ENCOUNTER — Other Ambulatory Visit: Payer: Self-pay | Admitting: Internal Medicine

## 2013-10-13 ENCOUNTER — Telehealth: Payer: Self-pay | Admitting: Internal Medicine

## 2013-10-13 ENCOUNTER — Other Ambulatory Visit: Payer: Self-pay

## 2013-10-13 DIAGNOSIS — Z4502 Encounter for adjustment and management of automatic implantable cardiac defibrillator: Secondary | ICD-10-CM

## 2013-10-13 DIAGNOSIS — I255 Ischemic cardiomyopathy: Secondary | ICD-10-CM

## 2013-10-13 DIAGNOSIS — Z95810 Presence of automatic (implantable) cardiac defibrillator: Secondary | ICD-10-CM

## 2013-10-13 DIAGNOSIS — I4891 Unspecified atrial fibrillation: Secondary | ICD-10-CM

## 2013-10-13 DIAGNOSIS — I5022 Chronic systolic (congestive) heart failure: Secondary | ICD-10-CM

## 2013-10-13 MED ORDER — VALSARTAN 80 MG PO TABS: 40.0000 mg | ORAL_TABLET | Freq: Every day | ORAL | Status: DC

## 2013-10-13 NOTE — Telephone Encounter (Signed)
Gave pt's wife [phone] for [employer]. Jude.

## 2013-10-13 NOTE — Telephone Encounter (Signed)
New Problem:  Pt's wife is calling to see when they can expect to have the new device ID card. Pt/Pt's wife would like a call back.

## 2013-10-21 ENCOUNTER — Other Ambulatory Visit (HOSPITAL_COMMUNITY): Payer: Self-pay | Admitting: Cardiology

## 2013-10-21 DIAGNOSIS — I5022 Chronic systolic (congestive) heart failure: Secondary | ICD-10-CM

## 2013-10-21 MED ORDER — DIGOXIN 125 MCG PO TABS: 0.0625 mg | ORAL_TABLET | Freq: Every day | ORAL | Status: DC

## 2013-10-21 NOTE — Telephone Encounter (Signed)
pts wife called called to request refill

## 2013-10-29 ENCOUNTER — Other Ambulatory Visit: Payer: Self-pay

## 2013-10-29 MED ORDER — ROSUVASTATIN CALCIUM 20 MG PO TABS: 20.0000 mg | ORAL_TABLET | Freq: Every day | ORAL | Status: DC

## 2013-11-18 DIAGNOSIS — H2510 Age-related nuclear cataract, unspecified eye: Secondary | ICD-10-CM | POA: Diagnosis not present

## 2013-11-18 DIAGNOSIS — Z961 Presence of intraocular lens: Secondary | ICD-10-CM | POA: Diagnosis not present

## 2013-11-23 DIAGNOSIS — N138 Other obstructive and reflux uropathy: Secondary | ICD-10-CM | POA: Diagnosis not present

## 2013-11-23 DIAGNOSIS — N401 Enlarged prostate with lower urinary tract symptoms: Secondary | ICD-10-CM | POA: Diagnosis not present

## 2013-11-23 DIAGNOSIS — R351 Nocturia: Secondary | ICD-10-CM | POA: Diagnosis not present

## 2013-11-23 DIAGNOSIS — Z8551 Personal history of malignant neoplasm of bladder: Secondary | ICD-10-CM | POA: Diagnosis not present

## 2013-11-23 DIAGNOSIS — R339 Retention of urine, unspecified: Secondary | ICD-10-CM | POA: Diagnosis not present

## 2013-11-24 DIAGNOSIS — E039 Hypothyroidism, unspecified: Secondary | ICD-10-CM | POA: Diagnosis not present

## 2013-11-24 DIAGNOSIS — I2589 Other forms of chronic ischemic heart disease: Secondary | ICD-10-CM | POA: Diagnosis not present

## 2013-11-24 DIAGNOSIS — R7309 Other abnormal glucose: Secondary | ICD-10-CM | POA: Diagnosis not present

## 2013-11-24 DIAGNOSIS — E785 Hyperlipidemia, unspecified: Secondary | ICD-10-CM | POA: Diagnosis not present

## 2013-11-24 DIAGNOSIS — I10 Essential (primary) hypertension: Secondary | ICD-10-CM | POA: Diagnosis not present

## 2013-11-24 DIAGNOSIS — N184 Chronic kidney disease, stage 4 (severe): Secondary | ICD-10-CM | POA: Diagnosis not present

## 2013-11-24 DIAGNOSIS — C679 Malignant neoplasm of bladder, unspecified: Secondary | ICD-10-CM | POA: Diagnosis not present

## 2013-11-24 DIAGNOSIS — I252 Old myocardial infarction: Secondary | ICD-10-CM | POA: Diagnosis not present

## 2013-12-06 ENCOUNTER — Encounter: Payer: Self-pay | Admitting: Internal Medicine

## 2013-12-11 ENCOUNTER — Other Ambulatory Visit (HOSPITAL_COMMUNITY): Payer: Self-pay | Admitting: Internal Medicine

## 2013-12-16 ENCOUNTER — Encounter: Payer: Self-pay | Admitting: Internal Medicine

## 2013-12-16 ENCOUNTER — Ambulatory Visit (INDEPENDENT_AMBULATORY_CARE_PROVIDER_SITE_OTHER): Payer: Medicare Other | Admitting: Internal Medicine

## 2013-12-16 VITALS — BP 107/64 | HR 60 | Ht 70.0 in | Wt 176.0 lb

## 2013-12-16 DIAGNOSIS — T82198A Other mechanical complication of other cardiac electronic device, initial encounter: Secondary | ICD-10-CM

## 2013-12-16 DIAGNOSIS — I2589 Other forms of chronic ischemic heart disease: Secondary | ICD-10-CM

## 2013-12-16 DIAGNOSIS — I4891 Unspecified atrial fibrillation: Secondary | ICD-10-CM | POA: Diagnosis not present

## 2013-12-16 DIAGNOSIS — I5022 Chronic systolic (congestive) heart failure: Secondary | ICD-10-CM | POA: Diagnosis not present

## 2013-12-16 DIAGNOSIS — Z95810 Presence of automatic (implantable) cardiac defibrillator: Secondary | ICD-10-CM

## 2013-12-16 DIAGNOSIS — I255 Ischemic cardiomyopathy: Secondary | ICD-10-CM

## 2013-12-16 HISTORY — DX: Other mechanical complication of other cardiac electronic device, initial encounter: T82.198A

## 2013-12-16 LAB — MDC_IDC_ENUM_SESS_TYPE_INCLINIC
Brady Statistic RA Percent Paced: 99.29 %
Brady Statistic RV Percent Paced: 99.96 %
HighPow Impedance: 48 Ohm
Implantable Pulse Generator Serial Number: 7138995
Lead Channel Impedance Value: 437.5 Ohm
Lead Channel Pacing Threshold Amplitude: 1 V
Lead Channel Pacing Threshold Amplitude: 1.5 V
Lead Channel Pacing Threshold Amplitude: 1.5 V
Lead Channel Pacing Threshold Pulse Width: 0.5 ms
Lead Channel Pacing Threshold Pulse Width: 0.5 ms
Lead Channel Pacing Threshold Pulse Width: 0.8 ms
Lead Channel Pacing Threshold Pulse Width: 0.8 ms
Lead Channel Sensing Intrinsic Amplitude: 3.2 mV
Lead Channel Setting Pacing Amplitude: 2 V
Lead Channel Setting Pacing Amplitude: 2.5 V
Lead Channel Setting Sensing Sensitivity: 0.5 mV
MDC IDC MSMT BATTERY REMAINING LONGEVITY: 58.8 mo
MDC IDC MSMT LEADCHNL LV IMPEDANCE VALUE: 437.5 Ohm
MDC IDC MSMT LEADCHNL LV PACING THRESHOLD AMPLITUDE: 1 V
MDC IDC MSMT LEADCHNL LV PACING THRESHOLD PULSEWIDTH: 0.8 ms
MDC IDC MSMT LEADCHNL LV PACING THRESHOLD PULSEWIDTH: 0.8 ms
MDC IDC MSMT LEADCHNL RA PACING THRESHOLD AMPLITUDE: 0.75 V
MDC IDC MSMT LEADCHNL RA PACING THRESHOLD AMPLITUDE: 0.75 V
MDC IDC MSMT LEADCHNL RV IMPEDANCE VALUE: 375 Ohm
MDC IDC MSMT LEADCHNL RV SENSING INTR AMPL: 12 mV
MDC IDC SESS DTM: 20150305154121
MDC IDC SET LEADCHNL LV PACING AMPLITUDE: 2.5 V
MDC IDC SET LEADCHNL LV PACING PULSEWIDTH: 0.8 ms
MDC IDC SET LEADCHNL RV PACING PULSEWIDTH: 0.8 ms
MDC IDC SET ZONE DETECTION INTERVAL: 250 ms
MDC IDC SET ZONE DETECTION INTERVAL: 300 ms

## 2013-12-16 NOTE — Progress Notes (Signed)
Patient Care Team: [name], MD as PCP - General (Internal Medicine)   HPI  [name] is a 80 y.o. male seen in followup for ischemic heart myopathy congestive heart failure and prior CRT-D with generator replaced 12/14 Past Medical History  Diagnosis Date  . CAD (coronary artery disease)      a. s/p anterior MI 12/05 c/b shock -> stent LAD   b. s/p stenting OM-1, 2/06  . CHF (congestive heart failure)     due to ischemic CM  a. EF 20-30%. (Nov 2008)   b. s/p St. Jude BiV-ICD    c. CPX 07/2008  pvo2 16.3 (63% predicted) slope 34 RER 1.08 O2 pulse 93%  . Hyperlipidemia   . Hypothyroidism   . GERD (gastroesophageal reflux disease)     hx Barretts esophagitis  . Anemia   . 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 History of blood transfusion     following coumadin  usage  . Cough     occasional /productive, no fever/ not new  . Skin cancer     basal cells   facial x 4, 1 right forearm  . Bladder cancer dx'd 05/2012  . HTN (hypertension)     EKG 05/14/12,Chest x ray 8/13, last ICD interrogation 8/13 EPIC  . Atrial fibrillation or flutter     maintaining sinus on amiodarone    . Sleep apnea     STOP BANG SCORE 4  . CRI (chronic renal insufficiency)     (baseline 2.0-2.2)/ recent hospitalization 8/13    Past Surgical History  Procedure Laterality Date  . Cervical laminectomy  1985  . Back surgery  1972    lower back  . Cardiac defibrillator placement      ICD St Jude; gen change 09-20-13  . Hernia repair  1989    bilateral  . Esophagogastroduodenoscopy    . Colonoscopy    . Coronary angioplasty  [id number]  . Transurethral resection of bladder tumor  05/25/2012    cold cup biopsy prostate  . Cystoscopy w/ retrogrades  05/25/2012    Procedure: CYSTOSCOPY WITH RETROGRADE PYELOGRAM;  Surgeon: Molli Hazard, MD;  Location: [facility];  Service: Urology;  Laterality: Bilateral;  . Transurethral resection of  bladder tumor  07/08/2012    Procedure: TRANSURETHRAL RESECTION OF BLADDER TUMOR (TURBT);  Surgeon: Molli Hazard, MD;  Location: [facility];  Service: Urology;  Laterality: N/A;  CYSTO, TURBT W/ GYRUS, BILATERAL STENT REMOVAL, LEFT URETEROSCOPY WITH BIOPSY, POSSIBLE LEFT STENT PLACEMENT   . Cystoscopy w/ ureteral stent removal  07/08/2012    Procedure: CYSTOSCOPY WITH STENT REMOVAL;  Surgeon: Molli Hazard, MD;  Location: [facility];  Service: Urology;  Laterality: Bilateral;  . Cystoscopy w/ ureteral stent placement  07/08/2012    Procedure: CYSTOSCOPY WITH STENT REPLACEMENT;  Surgeon: Molli Hazard, MD;  Location: [facility];  Service: Urology;  Laterality: Left;  . Cystoscopy/retrograde/ureteroscopy  07/08/2012    Procedure: CYSTOSCOPY/RETROGRADE/URETEROSCOPY;  Surgeon: Molli Hazard, MD;  Location: [facility];  Service: Urology;  Laterality: Left;    Current Outpatient Prescriptions  Medication Sig Dispense Refill  . acetaminophen (TYLENOL) 325 MG tablet Take 2 tablets (650 mg total) by mouth every 6 (six) hours as needed for mild pain (or Fever >/= 101).      Marland Kitchen aspirin 325 MG tablet Take 325 mg by mouth daily.      Marland Kitchen  carvedilol (COREG) 6.25 MG tablet Take 6.25 mg by mouth 2 (two) times daily with a meal.      . clopidogrel (PLAVIX) 75 MG tablet Take 75 mg by mouth daily.      . digoxin (LANOXIN) 0.125 MG tablet Take 0.5 tablets (0.0625 mg total) by mouth daily.  15 tablet  5  . ezetimibe (ZETIA) 10 MG tablet Take 10 mg by mouth daily.      . ferrous sulfate ([employer]S IRON) 325 (65 FE) MG tablet Take 325 mg by mouth every Monday, Wednesday, and Friday.       . furosemide (LASIX) 20 MG tablet Take 20 mg by mouth every Monday, Wednesday, and Friday.       . hydrOXYzine (ATARAX/VISTARIL) 25 MG tablet Take 1 tablet by mouth as needed.      Marland Kitchen KLOR-CON M10 10 MEQ tablet Take 10 mEq by mouth every Monday, Wednesday, and Friday.       . levothyroxine (SYNTHROID, LEVOTHROID) 50 MCG tablet  Take 50 mcg by mouth daily before breakfast.       . Magnesium Hydroxide (MILK OF MAGNESIA PO) Take 20 mLs by mouth daily as needed (for constipation).      . Multiple Vitamins-Minerals (CENTRUM SILVER PO) Take 0.5 tablets by mouth 2 (two) times daily.      . nitroGLYCERIN (NITROSTAT) 0.4 MG SL tablet Place 1 tablet (0.4 mg total) under the tongue every 5 (five) minutes as needed. For chest pain  25 tablet  2  . PACERONE 200 MG tablet TAKE ONE-HALF TABLET BY MOUTH EVERY DAY  45 tablet  3  . pantoprazole (PROTONIX) 40 MG tablet Take 40 mg by mouth daily.       . rosuvastatin (CRESTOR) 20 MG tablet Take 1 tablet (20 mg total) by mouth daily.  30 tablet  6  . tamsulosin (FLOMAX) 0.4 MG CAPS capsule Take 1 capsule by mouth at bedtime.      . valsartan (DIOVAN) 80 MG tablet Take 0.5 tablets (40 mg total) by mouth daily. Only if SBP > 100  30 tablet  6   No current facility-administered medications for this visit.    Allergies  Allergen Reactions  . Ramipril Other (See Comments)    cough  . Amoxicillin Rash  . Penicillins Rash    Review of Systems negative except from HPI and PMH  Physical Exam BP 107/64  Pulse 60  Ht 5\' 10"  (1.778 m)  Wt 176 lb (79.833 kg)  BMI 25.25 kg/m2 Well developed and well nourished in no acute distress HENT normal E scleral and icterus clear Neck Supple JVP flat; carotids brisk and full Clear to ausculation *Regular rate and rhythm, no murmurs gallops or rub Soft with active bowel sounds No clubbing cyanosis 2+ Edema Alert and oriented, grossly normal motor and sensory function Skin Warm and Dry  ECG changed axis   Assessment and  Plan  Ischemic cardiomyopathy]  CRT-d  Left ventricular lead failure to capture  Atrial fibrillation  Heart failure-chronic systolic  Renal insufficiency grade 3-4  The patient has symptoms of heart failure and fluid accumulation. His left ventricular lead has not been capturing because of a high is in the ring  electrodes suggestive of either fracture of the proximal electrode or an issue at the header. We have reprogrammed the device to use to the RV coil which is tolerating has a good threshold. We'll also increase his diuretic and 20-40 mg 3 times a week may go back  to his baseline.  There is no intercurrent atrial fibrillation of note.  Last creatinine was about 2.5

## 2013-12-16 NOTE — Patient Instructions (Signed)

## 2013-12-20 ENCOUNTER — Ambulatory Visit (HOSPITAL_COMMUNITY)
Admission: RE | Admit: 2013-12-20 | Discharge: 2013-12-20 | Disposition: A | Discharge status: Home / Self Care | Payer: Medicare Other | Source: Ambulatory Visit | Attending: Internal Medicine | Admitting: Internal Medicine

## 2013-12-20 VITALS — BP 102/52 | HR 90 | Wt 190.5 lb

## 2013-12-20 DIAGNOSIS — I2510 Atherosclerotic heart disease of native coronary artery without angina pectoris: Secondary | ICD-10-CM | POA: Diagnosis not present

## 2013-12-20 DIAGNOSIS — I6529 Occlusion and stenosis of unspecified carotid artery: Secondary | ICD-10-CM | POA: Diagnosis not present

## 2013-12-20 DIAGNOSIS — I5022 Chronic systolic (congestive) heart failure: Secondary | ICD-10-CM | POA: Diagnosis not present

## 2013-12-20 LAB — CBC
HEMATOCRIT: 36.8 % — AB (ref 39.0–52.0)
Hemoglobin: 11.9 g/dL — ABNORMAL LOW (ref 13.0–17.0)
MCH: 29.5 pg (ref 26.0–34.0)
MCHC: 32.3 g/dL (ref 30.0–36.0)
MCV: 91.1 fL (ref 78.0–100.0)
Platelets: 202 10*3/uL (ref 150–400)
RBC: 4.04 MIL/uL — ABNORMAL LOW (ref 4.22–5.81)
RDW: 13.7 % (ref 11.5–15.5)
WBC: 6.9 10*3/uL (ref 4.0–10.5)

## 2013-12-20 LAB — COMPREHENSIVE METABOLIC PANEL
ALK PHOS: 59 U/L (ref 39–117)
ALT: 12 U/L (ref 0–53)
AST: 17 U/L (ref 0–37)
Albumin: 3.6 g/dL (ref 3.5–5.2)
BILIRUBIN TOTAL: 0.4 mg/dL (ref 0.3–1.2)
BUN: 34 mg/dL — AB (ref 6–23)
CHLORIDE: 105 meq/L (ref 96–112)
CO2: 24 mEq/L (ref 19–32)
Calcium: 9 mg/dL (ref 8.4–10.5)
Creatinine, Ser: 2.74 mg/dL — ABNORMAL HIGH (ref 0.50–1.35)
GFR calc Af Amer: 24 mL/min — ABNORMAL LOW (ref >90)
GFR calc non Af Amer: 20 mL/min — ABNORMAL LOW (ref >90)
Glucose, Bld: 125 mg/dL — ABNORMAL HIGH (ref 70–99)
POTASSIUM: 4.1 meq/L (ref 3.7–5.3)
Sodium: 142 mEq/L (ref 137–147)
Total Protein: 7 g/dL (ref 6.0–8.3)

## 2013-12-20 NOTE — Addendum Note (Signed)
Encounter addended by: Scarlette Calico, RN on: 12/20/2013 12:37 PM<BR>     Documentation filed: Patient Instructions Section, Orders

## 2013-12-20 NOTE — Patient Instructions (Signed)
Labs today  We will contact you in 6 months to schedule your next appointment.  

## 2013-12-20 NOTE — Progress Notes (Signed)
Patient ID: [name], male   DOB: 02-26-1932, 80 y.o.   MRN: [id number] Urologist: Dr [name] HPI:  [name] is delightful 80 year old male with a history of coronary artery disease, status post previous large anterior wall myocardial infarction s/p multivessel stenting, systolic CHF with EF 123456 s/p St. Jude BiV ICD.  Remainder of his medical history is notable for atrial fibrillation,maintaining sinus rhythm on amiodarone.  He is not on Coumadin secondary to GI bleed, chronic renal insufficiency with baseline creatinine about 2.2-2.5, hypertension, hyperlipidemia, and a systemic tremor.Bladder cancer 05/2012. Completed BCG treatment.   In 2012 had NYHA III-IIIB symptoms. We discussed possibility of RHC and inotropes but he wanted to defer.    ECHO 12/14 LVEF 20-25% Carotid u/s 3/14: 40-59% R 0-39%%L   He returns for follow up. Saw Dr. Caryl [name] last week and found that his left ventricular lead has not been capturing suggestive of either fracture of the proximal electrode or an issue at the header. They reprogrammed the device to use to the RV coil which he tolerated well and had a good threshold. He also had volume overload and lasix increased from 20 mg 3x/week to 40mg  daily for 1 week. Noticed increased u/o for 2-3 days now back to baseline. Weight stable at 180.  Says he feels a bit better after reprogramming but not much different.    ROS: All systems negative except as listed in HPI, PMH and Problem List.  Past Medical History  Diagnosis Date  . CAD (coronary artery disease)      a. s/p anterior MI 12/05 c/b shock -> stent LAD   b. s/p stenting OM-1, 2/06  . CHF (congestive heart failure)     due to ischemic CM  a. EF 20-30%. (Nov 2008)   b. s/p St. Jude BiV-ICD    c. CPX 07/2008  pvo2 16.3 (63% predicted) slope 34 RER 1.08 O2 pulse 93%  . Hyperlipidemia   . Hypothyroidism   . GERD (gastroesophageal reflux disease)     hx Barretts esophagitis  . Anemia   . Neuromuscular disorder      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"   no neurologist  . History of blood transfusion     following coumadin  usage  . Cough     occasional /productive, no fever/ not new  . Skin cancer     basal cells   facial x 4, 1 right forearm  . Bladder cancer dx'd 05/2012  . HTN (hypertension)     EKG 05/14/12,Chest x ray 8/13, last ICD interrogation 8/13 EPIC  . Atrial fibrillation or flutter     maintaining sinus on amiodarone    . Sleep apnea     STOP BANG SCORE 4  . CRI (chronic renal insufficiency)     (baseline 2.0-2.2)/ recent hospitalization 8/13  . 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 acetaminophen (TYLENOL) 325 MG tablet Take 2 tablets (650 mg total) by mouth every 6 (six) hours as needed for mild pain (or Fever >/= 101).      Marland Kitchen aspirin 325 MG tablet Take 325 mg by mouth daily.      . carvedilol (COREG) 6.25 MG tablet Take 6.25 mg by mouth 2 (two) times daily with a meal.      . clopidogrel (PLAVIX) 75 MG tablet Take 75 mg by mouth daily.      . digoxin (LANOXIN) 0.125  MG tablet Take 0.5 tablets (0.0625 mg total) by mouth daily.  15 tablet  5  . ezetimibe (ZETIA) 10 MG tablet Take 10 mg by mouth daily.      . ferrous sulfate ([employer]S IRON) 325 (65 FE) MG tablet Take 325 mg by mouth every Monday, Wednesday, and Friday.       . furosemide (LASIX) 20 MG tablet Take 20 mg by mouth every Monday, Wednesday, and Friday.       . hydrOXYzine (ATARAX/VISTARIL) 25 MG tablet Take 1 tablet by mouth as needed.      Marland Kitchen KLOR-CON M10 10 MEQ tablet Take 10 mEq by mouth every Monday, Wednesday, and Friday.       . levothyroxine (SYNTHROID, LEVOTHROID) 50 MCG tablet Take 50 mcg by mouth daily before breakfast.       . Magnesium Hydroxide (MILK OF MAGNESIA PO) Take 20 mLs by mouth daily as needed (for constipation).      . Multiple Vitamins-Minerals (CENTRUM SILVER PO) Take 0.5 tablets by mouth 2 (two)  times daily.      . nitroGLYCERIN (NITROSTAT) 0.4 MG SL tablet Place 1 tablet (0.4 mg total) under the tongue every 5 (five) minutes as needed. For chest pain  25 tablet  2  . PACERONE 200 MG tablet TAKE ONE-HALF TABLET BY MOUTH EVERY DAY  45 tablet  3  . pantoprazole (PROTONIX) 40 MG tablet Take 40 mg by mouth daily.       . rosuvastatin (CRESTOR) 20 MG tablet Take 1 tablet (20 mg total) by mouth daily.  30 tablet  6  . tamsulosin (FLOMAX) 0.4 MG CAPS capsule Take 1 capsule by mouth at bedtime.      . valsartan (DIOVAN) 80 MG tablet Take 0.5 tablets (40 mg total) by mouth daily. Only if SBP > 100  30 tablet  6   No current facility-administered medications for this encounter.     PHYSICAL EXAM: Filed Vitals:   12/20/13 1152  BP: 102/52  Pulse: 90    General:  Elderly.   no resp difficulty (wife present)  HEENT: normal Neck: supple. JVP 6 Carotids 2+ bilat; no bruits. Cor: PMI laterally displaced. Regular rate & rhythm. No rubs, gallops, 2/6 SEM murmur at apex. ICD site ok Lungs: clear with decreased air movement (mild) Abdomen: soft, nontender, distended  No hepatosplenomegaly. No bruits or masses. Good bowel sounds. Extremities: no cyanosis, clubbing, rash, edema. +vericose veins.  Neuro: alert & orientedx3, cranial nerves grossly intact. moves all 4 extremities w/o difficulty. affect pleasant   ASSESSMENT & PLAN: 1. Chronic systolic CHF    --Doing well. Stable NYHA II-III. Agree with current medicine regimen. BP too low to advance further.    --He is on very low dose lasix for his LV function and renal failure but seems to be tolerating well. Reinforced need for daily weights and reviewed use of sliding scale diuretics. Told him he may need to take lasix most days of the week.     --Check labs today 2. CAD     --No evidence of ischemia. Continue current regimen. Dr. Virgina [name] following lipids.  3. PAF       --Maintaining SR on low dose amio. No AF on device interrogation with Dr.  Caryl [name]. Not on coumadin due to h/o GIB. 4. Carotid ultrasound - asx. Due for repeat scanning.    [name] Bensimhon,MD 12:20 PM

## 2013-12-20 NOTE — Addendum Note (Signed)
Encounter addended by: Renee Pain, RN on: 12/20/2013 12:45 PM<BR>     Documentation filed: Orders

## 2013-12-21 ENCOUNTER — Other Ambulatory Visit (HOSPITAL_COMMUNITY): Payer: Self-pay | Admitting: Internal Medicine

## 2013-12-21 DIAGNOSIS — N401 Enlarged prostate with lower urinary tract symptoms: Secondary | ICD-10-CM | POA: Diagnosis not present

## 2013-12-21 DIAGNOSIS — R339 Retention of urine, unspecified: Secondary | ICD-10-CM | POA: Diagnosis not present

## 2013-12-21 DIAGNOSIS — R351 Nocturia: Secondary | ICD-10-CM | POA: Diagnosis not present

## 2013-12-21 DIAGNOSIS — N138 Other obstructive and reflux uropathy: Secondary | ICD-10-CM | POA: Diagnosis not present

## 2013-12-21 DIAGNOSIS — Z8551 Personal history of malignant neoplasm of bladder: Secondary | ICD-10-CM | POA: Diagnosis not present

## 2013-12-28 ENCOUNTER — Ambulatory Visit (HOSPITAL_COMMUNITY)
Admission: RE | Admit: 2013-12-28 | Discharge: 2013-12-28 | Disposition: A | Discharge status: Home / Self Care | Payer: Medicare Other | Source: Ambulatory Visit | Attending: Internal Medicine | Admitting: Internal Medicine

## 2013-12-28 DIAGNOSIS — I6529 Occlusion and stenosis of unspecified carotid artery: Secondary | ICD-10-CM | POA: Insufficient documentation

## 2013-12-28 NOTE — Progress Notes (Signed)
VASCULAR LAB PRELIMINARY  PRELIMINARY  PRELIMINARY  PRELIMINARY  Carotid duplex  completed.    Preliminary report:  No significant ICA stenosis noted. Vertebral artery flow antegrade.  [name], RVT 12/28/2013, 11:38 AM

## 2014-01-25 ENCOUNTER — Other Ambulatory Visit: Payer: Self-pay

## 2014-01-25 MED ORDER — CARVEDILOL 6.25 MG PO TABS: 6.2500 mg | ORAL_TABLET | Freq: Two times a day (BID) | ORAL | Status: DC

## 2014-02-02 DIAGNOSIS — H6500 Acute serous otitis media, unspecified ear: Secondary | ICD-10-CM | POA: Diagnosis not present

## 2014-02-07 ENCOUNTER — Other Ambulatory Visit (HOSPITAL_COMMUNITY): Payer: Self-pay | Admitting: Internal Medicine

## 2014-02-24 ENCOUNTER — Telehealth (HOSPITAL_COMMUNITY): Payer: Self-pay | Admitting: Cardiology

## 2014-02-24 NOTE — Telephone Encounter (Signed)
Pt saw on television ad that taking a daily dose of Vitamin B3 can help reduce the risk of basal skin cancer Pt has already had small basal skin cell and squamous skin areas, pt would like to reduce the risk of areas returning. Please advise

## 2014-02-24 NOTE — Telephone Encounter (Signed)
Have been trying to reach pt however line is busy, will try again tomorrow

## 2014-02-25 NOTE — Telephone Encounter (Signed)
pt's wife aware ok to take

## 2014-03-01 DIAGNOSIS — Z8551 Personal history of malignant neoplasm of bladder: Secondary | ICD-10-CM | POA: Diagnosis not present

## 2014-03-17 ENCOUNTER — Encounter: Payer: Self-pay | Admitting: Internal Medicine

## 2014-03-17 ENCOUNTER — Ambulatory Visit (INDEPENDENT_AMBULATORY_CARE_PROVIDER_SITE_OTHER): Payer: Medicare Other | Admitting: *Deleted

## 2014-03-17 DIAGNOSIS — I4891 Unspecified atrial fibrillation: Secondary | ICD-10-CM | POA: Diagnosis not present

## 2014-03-17 DIAGNOSIS — I2589 Other forms of chronic ischemic heart disease: Secondary | ICD-10-CM

## 2014-03-18 NOTE — Progress Notes (Signed)
Remote ICD transmission.   

## 2014-03-21 LAB — MDC_IDC_ENUM_SESS_TYPE_REMOTE
Battery Remaining Percentage: 90 %
Battery Voltage: 3.1 V
Brady Statistic AP VS Percent: 1 %
Brady Statistic AS VP Percent: 1 %
Date Time Interrogation Session: 20150604080019
HIGH POWER IMPEDANCE MEASURED VALUE: 47 Ohm
HighPow Impedance: 46 Ohm
Implantable Pulse Generator Serial Number: 7138995
Lead Channel Impedance Value: 360 Ohm
Lead Channel Impedance Value: 400 Ohm
Lead Channel Pacing Threshold Amplitude: 0.75 V
Lead Channel Pacing Threshold Pulse Width: 0.5 ms
Lead Channel Sensing Intrinsic Amplitude: 2.3 mV
Lead Channel Setting Pacing Amplitude: 2.5 V
Lead Channel Setting Sensing Sensitivity: 0.5 mV
MDC IDC MSMT BATTERY REMAINING LONGEVITY: 61 mo
MDC IDC MSMT LEADCHNL LV IMPEDANCE VALUE: 480 Ohm
MDC IDC MSMT LEADCHNL LV PACING THRESHOLD AMPLITUDE: 1 V
MDC IDC MSMT LEADCHNL LV PACING THRESHOLD PULSEWIDTH: 0.8 ms
MDC IDC MSMT LEADCHNL RV PACING THRESHOLD AMPLITUDE: 1.5 V
MDC IDC MSMT LEADCHNL RV PACING THRESHOLD PULSEWIDTH: 0.8 ms
MDC IDC MSMT LEADCHNL RV SENSING INTR AMPL: 12 mV
MDC IDC SET LEADCHNL LV PACING AMPLITUDE: 2.5 V
MDC IDC SET LEADCHNL LV PACING PULSEWIDTH: 0.8 ms
MDC IDC SET LEADCHNL RA PACING AMPLITUDE: 2 V
MDC IDC SET LEADCHNL RV PACING PULSEWIDTH: 0.8 ms
MDC IDC STAT BRADY AP VP PERCENT: 99 %
MDC IDC STAT BRADY AS VS PERCENT: 1 %
MDC IDC STAT BRADY RA PERCENT PACED: 99 %
Zone Setting Detection Interval: 250 ms
Zone Setting Detection Interval: 300 ms

## 2014-03-24 DIAGNOSIS — I252 Old myocardial infarction: Secondary | ICD-10-CM | POA: Diagnosis not present

## 2014-03-24 DIAGNOSIS — R5383 Other fatigue: Secondary | ICD-10-CM | POA: Insufficient documentation

## 2014-03-24 DIAGNOSIS — D696 Thrombocytopenia, unspecified: Secondary | ICD-10-CM | POA: Diagnosis not present

## 2014-03-24 DIAGNOSIS — Z23 Encounter for immunization: Secondary | ICD-10-CM | POA: Diagnosis not present

## 2014-03-24 DIAGNOSIS — D649 Anemia, unspecified: Secondary | ICD-10-CM | POA: Diagnosis not present

## 2014-03-24 DIAGNOSIS — R5381 Other malaise: Secondary | ICD-10-CM | POA: Diagnosis not present

## 2014-03-24 DIAGNOSIS — E039 Hypothyroidism, unspecified: Secondary | ICD-10-CM | POA: Diagnosis not present

## 2014-03-24 DIAGNOSIS — Z950 Presence of cardiac pacemaker: Secondary | ICD-10-CM | POA: Insufficient documentation

## 2014-03-24 DIAGNOSIS — R531 Weakness: Secondary | ICD-10-CM | POA: Insufficient documentation

## 2014-03-24 DIAGNOSIS — N184 Chronic kidney disease, stage 4 (severe): Secondary | ICD-10-CM | POA: Diagnosis not present

## 2014-03-24 DIAGNOSIS — I4891 Unspecified atrial fibrillation: Secondary | ICD-10-CM | POA: Diagnosis not present

## 2014-03-24 DIAGNOSIS — I2510 Atherosclerotic heart disease of native coronary artery without angina pectoris: Secondary | ICD-10-CM | POA: Diagnosis not present

## 2014-03-24 DIAGNOSIS — E785 Hyperlipidemia, unspecified: Secondary | ICD-10-CM | POA: Diagnosis not present

## 2014-03-25 DIAGNOSIS — H66009 Acute suppurative otitis media without spontaneous rupture of ear drum, unspecified ear: Secondary | ICD-10-CM | POA: Diagnosis not present

## 2014-04-07 ENCOUNTER — Encounter: Payer: Self-pay | Admitting: Internal Medicine

## 2014-04-07 ENCOUNTER — Ambulatory Visit (INDEPENDENT_AMBULATORY_CARE_PROVIDER_SITE_OTHER): Payer: Medicare Other | Admitting: Internal Medicine

## 2014-04-07 VITALS — BP 119/65 | HR 60 | Ht 70.0 in | Wt 184.0 lb

## 2014-04-07 DIAGNOSIS — I2510 Atherosclerotic heart disease of native coronary artery without angina pectoris: Secondary | ICD-10-CM | POA: Diagnosis not present

## 2014-04-07 DIAGNOSIS — I2589 Other forms of chronic ischemic heart disease: Secondary | ICD-10-CM | POA: Diagnosis not present

## 2014-04-07 DIAGNOSIS — I5022 Chronic systolic (congestive) heart failure: Secondary | ICD-10-CM | POA: Diagnosis not present

## 2014-04-07 DIAGNOSIS — I4891 Unspecified atrial fibrillation: Secondary | ICD-10-CM | POA: Diagnosis not present

## 2014-04-07 LAB — MDC_IDC_ENUM_SESS_TYPE_INCLINIC
Brady Statistic RV Percent Paced: 99.78 %
Date Time Interrogation Session: 20150625162828
HIGH POWER IMPEDANCE MEASURED VALUE: 47 Ohm
Implantable Pulse Generator Serial Number: 7138995
Lead Channel Impedance Value: 412.5 Ohm
Lead Channel Impedance Value: 462.5 Ohm
Lead Channel Pacing Threshold Amplitude: 0.75 V
Lead Channel Pacing Threshold Amplitude: 1 V
Lead Channel Pacing Threshold Amplitude: 1 V
Lead Channel Pacing Threshold Pulse Width: 0.8 ms
Lead Channel Pacing Threshold Pulse Width: 0.8 ms
Lead Channel Pacing Threshold Pulse Width: 0.8 ms
Lead Channel Sensing Intrinsic Amplitude: 12 mV
Lead Channel Setting Pacing Amplitude: 2 V
Lead Channel Setting Pacing Pulse Width: 0.8 ms
Lead Channel Setting Sensing Sensitivity: 0.5 mV
MDC IDC MSMT BATTERY REMAINING LONGEVITY: 57.6 mo
MDC IDC MSMT LEADCHNL LV PACING THRESHOLD AMPLITUDE: 1 V
MDC IDC MSMT LEADCHNL RA PACING THRESHOLD AMPLITUDE: 0.75 V
MDC IDC MSMT LEADCHNL RA PACING THRESHOLD PULSEWIDTH: 0.5 ms
MDC IDC MSMT LEADCHNL RA PACING THRESHOLD PULSEWIDTH: 0.5 ms
MDC IDC MSMT LEADCHNL RA SENSING INTR AMPL: 4.4 mV
MDC IDC MSMT LEADCHNL RV IMPEDANCE VALUE: 362.5 Ohm
MDC IDC SET LEADCHNL LV PACING AMPLITUDE: 2 V
MDC IDC SET LEADCHNL RV PACING AMPLITUDE: 2.5 V
MDC IDC SET LEADCHNL RV PACING PULSEWIDTH: 0.8 ms
MDC IDC STAT BRADY RA PERCENT PACED: 99 %
Zone Setting Detection Interval: 250 ms
Zone Setting Detection Interval: 300 ms

## 2014-04-07 NOTE — Patient Instructions (Signed)
Your physician wants you to follow-up in: 12 months with Dr Gari Crown will receive a reminder letter in the mail two months in advance. If you don't receive a letter, please call our office to schedule the follow-up appointment.  Remote monitoring is used to monitor your Pacemaker of ICD from home. This monitoring reduces the number of office visits required to check your device to one time per year. It allows [place] to keep an eye on the functioning of your device to ensure it is working properly. You are scheduled for a device check from home on 07/07/14. You may send your transmission at any time that day. If you have a wireless device, the transmission will be sent automatically. After your physician reviews your transmission, you will receive a postcard with your next transmission date.

## 2014-04-07 NOTE — Progress Notes (Signed)
Patient Care Team: [name], MD as PCP - General (Internal Medicine)   HPI  [name] is a 80 y.o. male seen in followup for ischemic heart myopathy congestive heart failure and prior CRT-D with generator replaced 12/14   We  reprogrammed his device at the last visit to exclude the proximal ring on the LV lead.  He continues to complain of exercise intolerance. This is manifested mostly is fatigue. He does not recall when he had less fatigue however.  He has had no palpitations.  He has a history of significant GI bleeding on Coumadin years and years ago he takes aspirin 325. He is here because significant atrial fibrillation has been detected on his device  Past Medical History  Diagnosis Date  . CAD (coronary artery disease)      a. s/p anterior MI 12/05 c/b shock -> stent LAD   b. s/p stenting OM-1, 2/06  . CHF (congestive heart failure)     due to ischemic CM  a. EF 20-30%. (Nov 2008)   b. s/p St. Jude BiV-ICD    c. CPX 07/2008  pvo2 16.3 (63% predicted) slope 34 RER 1.08 O2 pulse 93%  . Hyperlipidemia   . Hypothyroidism   . GERD (gastroesophageal reflux disease)     hx Barretts esophagitis  . Anemia   . 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 History of blood transfusion     following coumadin  usage  . Cough     occasional /productive, no fever/ not new  . Skin cancer     basal cells   facial x 4, 1 right forearm  . Bladder cancer dx'd 05/2012  . HTN (hypertension)     EKG 05/14/12,Chest x ray 8/13, last ICD interrogation 8/13 EPIC  . Atrial fibrillation or flutter     maintaining sinus on amiodarone    . Sleep apnea     STOP BANG SCORE 4  . CRI (chronic renal insufficiency)     (baseline 2.0-2.2)/ recent hospitalization 8/13  . 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 Cervical laminectomy  1985  . Back surgery   1972    lower back  . Cardiac defibrillator placement      ICD St Jude; gen change 09-20-13  . Hernia repair  1989    bilateral  . Esophagogastroduodenoscopy    . Colonoscopy    . Coronary angioplasty  [id number]  . Transurethral resection of bladder tumor  05/25/2012    cold cup biopsy prostate  . Cystoscopy w/ retrogrades  05/25/2012    Procedure: CYSTOSCOPY WITH RETROGRADE PYELOGRAM;  Surgeon: Molli Hazard, MD;  Location: [facility];  Service: Urology;  Laterality: Bilateral;  . Transurethral resection of bladder tumor  07/08/2012    Procedure: TRANSURETHRAL RESECTION OF BLADDER TUMOR (TURBT);  Surgeon: Molli Hazard, MD;  Location: [facility];  Service: Urology;  Laterality: N/A;  CYSTO, TURBT W/ GYRUS, BILATERAL STENT REMOVAL, LEFT URETEROSCOPY WITH BIOPSY, POSSIBLE LEFT STENT PLACEMENT   . Cystoscopy w/ ureteral stent removal  07/08/2012    Procedure: CYSTOSCOPY WITH STENT REMOVAL;  Surgeon: Molli Hazard, MD;  Location: [facility];  Service: Urology;  Laterality: Bilateral;  . Cystoscopy w/ ureteral stent placement  07/08/2012    Procedure: CYSTOSCOPY WITH STENT REPLACEMENT;  Surgeon: Molli Hazard,  MD;  Location: [facility];  Service: Urology;  Laterality: Left;  . Cystoscopy/retrograde/ureteroscopy  07/08/2012    Procedure: CYSTOSCOPY/RETROGRADE/URETEROSCOPY;  Surgeon: Molli Hazard, MD;  Location: [facility];  Service: Urology;  Laterality: Left;    Current Outpatient Prescriptions  Medication Sig Dispense Refill  . acetaminophen (TYLENOL) 325 MG tablet Take 2 tablets (650 mg total) by mouth every 6 (six) hours as needed for mild pain (or Fever >/= 101).      Marland Kitchen aspirin 325 MG tablet Take 325 mg by mouth daily.      . carvedilol (COREG) 6.25 MG tablet Take 1 tablet (6.25 mg total) by mouth 2 (two) times daily with a meal.  60 tablet  4  . clopidogrel (PLAVIX) 75 MG tablet TAKE ONE TABLET BY MOUTH ONCE DAILY  30 tablet  12  . digoxin (LANOXIN) 0.125 MG tablet Take  0.5 tablets (0.0625 mg total) by mouth daily.  15 tablet  5  . ferrous sulfate ([employer]S IRON) 325 (65 FE) MG tablet Take 325 mg by mouth every Monday, Wednesday, and Friday.       . furosemide (LASIX) 20 MG tablet Take 20 mg by mouth every Monday, Wednesday, and Friday.       . hydrOXYzine (ATARAX/VISTARIL) 25 MG tablet Take 1 tablet by mouth as needed.      Marland Kitchen KLOR-CON M10 10 MEQ tablet Take 10 mEq by mouth daily.       Marland Kitchen levothyroxine (SYNTHROID, LEVOTHROID) 50 MCG tablet Take 50 mcg by mouth daily before breakfast.       . Magnesium Hydroxide (MILK OF MAGNESIA PO) Take 20 mLs by mouth daily as needed (for constipation).      . Multiple Vitamins-Minerals (CENTRUM SILVER PO) Take 0.5 tablets by mouth 2 (two) times daily.      . nitroGLYCERIN (NITROSTAT) 0.4 MG SL tablet Place 1 tablet (0.4 mg total) under the tongue every 5 (five) minutes as needed. For chest pain  25 tablet  2  . PACERONE 200 MG tablet TAKE ONE-HALF TABLET BY MOUTH EVERY DAY  45 tablet  3  . pantoprazole (PROTONIX) 40 MG tablet Take 40 mg by mouth daily.       . rosuvastatin (CRESTOR) 20 MG tablet Take 1 tablet (20 mg total) by mouth daily.  30 tablet  6  . tamsulosin (FLOMAX) 0.4 MG CAPS capsule Take 1 capsule by mouth at bedtime.      . valsartan (DIOVAN) 80 MG tablet Take 0.5 tablets (40 mg total) by mouth daily. Only if SBP > 100  30 tablet  6  . ZETIA 10 MG tablet TAKE ONE TABLET BY MOUTH EVERY DAY WITH  SUPPER  30 tablet  12   No current facility-administered medications for this visit.    Allergies  Allergen Reactions  . Ramipril Other (See Comments)    cough  . Amoxicillin Rash  . Penicillins Rash    Review of Systems negative except from HPI and PMH  Physical Exam BP 119/65  Pulse 60  Ht 5\' 10"  (1.778 m)  Wt 184 lb (83.462 kg)  BMI 26.40 kg/m2 Well developed and well nourished in no acute distress HENT normal E scleral and icterus clear Neck Supple JVP flat; carotids brisk and full Clear to  ausculation *Regular rate and rhythm, no murmurs gallops or rub Soft with active bowel sounds No clubbing cyanosis 2+ Edema Alert and oriented, grossly normal motor and sensory function Skin Warm and Dry  ECG changed axis  Assessment and  Plan  Ischemic cardiomyopathy]  CRT-d  Left ventricular lead failure to capture  Atrial fibrillation  Heart failure-chronic systolic  Renal insufficiency grade 3-4  His device has detected significant atrial fibrillation.  His CHADS-VASc score is 5 (age-17 heart failure-1 hypertension-1 vascular disease-1)  he is currently taking aspirin 325 I've advised that we consider anticoagulation. He has a history of GI bleeding and racing somewhat reluctant. He would like to review this with Dr. Reine Just.  I think that is reasonable. My bias would be to use apixaban was based on the dosing relative to a renal functions.

## 2014-04-08 ENCOUNTER — Telehealth (HOSPITAL_COMMUNITY): Payer: Self-pay | Admitting: Vascular Surgery

## 2014-04-08 NOTE — Telephone Encounter (Signed)
PT=== this call is for Dr. Haroldine Laws or his nurse pt wife called.Marland KitchenMarland KitchenConcerning a suggested medication change by another doctor. Please advise

## 2014-04-08 NOTE — Telephone Encounter (Signed)
Spoke w/pt's wife she states pt saw Dr Caryl Comes yesterday who recommended he may need to start on eliquis or another anticoag, he previously was on coumadin which was stopped years ago due to GI bleed and is only on ASA now, he would like to make sure this is ok with Dr Haroldine Laws before starting med, will send mess to Dr Haroldine Laws for review and get back to them next week

## 2014-04-13 NOTE — Telephone Encounter (Signed)
Dr Haroldine Laws discussed w/pt via phone he does not want to start anticoag at this time will discuss more at next OV

## 2014-04-19 ENCOUNTER — Other Ambulatory Visit (HOSPITAL_COMMUNITY): Payer: Self-pay | Admitting: Internal Medicine

## 2014-05-30 ENCOUNTER — Other Ambulatory Visit: Payer: Self-pay | Admitting: *Deleted

## 2014-05-30 MED ORDER — ROSUVASTATIN CALCIUM 20 MG PO TABS
20.0000 mg | ORAL_TABLET | Freq: Every day | ORAL | Status: DC
Start: 2014-05-30 — End: 2015-01-13

## 2014-06-07 ENCOUNTER — Encounter (HOSPITAL_COMMUNITY): Payer: Self-pay

## 2014-06-07 ENCOUNTER — Ambulatory Visit (HOSPITAL_COMMUNITY)
Admission: RE | Admit: 2014-06-07 | Discharge: 2014-06-07 | Disposition: A | Discharge status: Home / Self Care | Payer: Medicare Other | Source: Ambulatory Visit | Attending: Cardiology | Admitting: Cardiology

## 2014-06-07 VITALS — BP 120/72 | HR 95 | Wt 179.4 lb

## 2014-06-07 DIAGNOSIS — G4730 Sleep apnea, unspecified: Secondary | ICD-10-CM | POA: Insufficient documentation

## 2014-06-07 DIAGNOSIS — K922 Gastrointestinal hemorrhage, unspecified: Secondary | ICD-10-CM | POA: Diagnosis not present

## 2014-06-07 DIAGNOSIS — I480 Paroxysmal atrial fibrillation: Secondary | ICD-10-CM

## 2014-06-07 DIAGNOSIS — Z7982 Long term (current) use of aspirin: Secondary | ICD-10-CM | POA: Diagnosis not present

## 2014-06-07 DIAGNOSIS — I129 Hypertensive chronic kidney disease with stage 1 through stage 4 chronic kidney disease, or unspecified chronic kidney disease: Secondary | ICD-10-CM | POA: Diagnosis not present

## 2014-06-07 DIAGNOSIS — I5022 Chronic systolic (congestive) heart failure: Secondary | ICD-10-CM

## 2014-06-07 DIAGNOSIS — D500 Iron deficiency anemia secondary to blood loss (chronic): Secondary | ICD-10-CM | POA: Insufficient documentation

## 2014-06-07 DIAGNOSIS — Z95810 Presence of automatic (implantable) cardiac defibrillator: Secondary | ICD-10-CM | POA: Diagnosis not present

## 2014-06-07 DIAGNOSIS — C4490 Unspecified malignant neoplasm of skin, unspecified: Secondary | ICD-10-CM | POA: Insufficient documentation

## 2014-06-07 DIAGNOSIS — G709 Myoneural disorder, unspecified: Secondary | ICD-10-CM | POA: Diagnosis not present

## 2014-06-07 DIAGNOSIS — I252 Old myocardial infarction: Secondary | ICD-10-CM | POA: Insufficient documentation

## 2014-06-07 DIAGNOSIS — I4892 Unspecified atrial flutter: Secondary | ICD-10-CM | POA: Insufficient documentation

## 2014-06-07 DIAGNOSIS — N184 Chronic kidney disease, stage 4 (severe): Secondary | ICD-10-CM | POA: Insufficient documentation

## 2014-06-07 DIAGNOSIS — I2510 Atherosclerotic heart disease of native coronary artery without angina pectoris: Secondary | ICD-10-CM

## 2014-06-07 DIAGNOSIS — E039 Hypothyroidism, unspecified: Secondary | ICD-10-CM | POA: Diagnosis not present

## 2014-06-07 DIAGNOSIS — I4891 Unspecified atrial fibrillation: Secondary | ICD-10-CM | POA: Diagnosis not present

## 2014-06-07 DIAGNOSIS — C679 Malignant neoplasm of bladder, unspecified: Secondary | ICD-10-CM | POA: Insufficient documentation

## 2014-06-07 DIAGNOSIS — K219 Gastro-esophageal reflux disease without esophagitis: Secondary | ICD-10-CM | POA: Diagnosis not present

## 2014-06-07 DIAGNOSIS — I509 Heart failure, unspecified: Secondary | ICD-10-CM | POA: Diagnosis not present

## 2014-06-07 DIAGNOSIS — E785 Hyperlipidemia, unspecified: Secondary | ICD-10-CM | POA: Insufficient documentation

## 2014-06-07 LAB — CBC
HCT: 35.7 % — ABNORMAL LOW (ref 39.0–52.0)
Hemoglobin: 11.6 g/dL — ABNORMAL LOW (ref 13.0–17.0)
MCH: 28.2 pg (ref 26.0–34.0)
MCHC: 32.5 g/dL (ref 30.0–36.0)
MCV: 86.9 fL (ref 78.0–100.0)
PLATELETS: 209 10*3/uL (ref 150–400)
RBC: 4.11 MIL/uL — AB (ref 4.22–5.81)
RDW: 13.5 % (ref 11.5–15.5)
WBC: 7.5 10*3/uL (ref 4.0–10.5)

## 2014-06-07 LAB — COMPREHENSIVE METABOLIC PANEL
ALT: 12 U/L (ref 0–53)
AST: 17 U/L (ref 0–37)
Albumin: 3.4 g/dL — ABNORMAL LOW (ref 3.5–5.2)
Alkaline Phosphatase: 75 U/L (ref 39–117)
Anion gap: 12 (ref 5–15)
BILIRUBIN TOTAL: 0.4 mg/dL (ref 0.3–1.2)
BUN: 30 mg/dL — ABNORMAL HIGH (ref 6–23)
CALCIUM: 8.9 mg/dL (ref 8.4–10.5)
CHLORIDE: 107 meq/L (ref 96–112)
CO2: 20 mEq/L (ref 19–32)
Creatinine, Ser: 2.04 mg/dL — ABNORMAL HIGH (ref 0.50–1.35)
GFR, EST AFRICAN AMERICAN: 34 mL/min — AB (ref >90)
GFR, EST NON AFRICAN AMERICAN: 29 mL/min — AB (ref >90)
GLUCOSE: 111 mg/dL — AB (ref 70–99)
Potassium: 4.5 mEq/L (ref 3.7–5.3)
Sodium: 139 mEq/L (ref 137–147)
Total Protein: 7.1 g/dL (ref 6.0–8.3)

## 2014-06-07 LAB — T4, FREE: Free T4: 1.92 ng/dL — ABNORMAL HIGH (ref 0.80–1.80)

## 2014-06-07 LAB — DIGOXIN LEVEL: DIGOXIN LVL: 0.6 ng/mL — AB (ref 0.8–2.0)

## 2014-06-07 LAB — TSH: TSH: 0.005 u[IU]/mL — ABNORMAL LOW (ref 0.350–4.500)

## 2014-06-07 MED ORDER — AMIODARONE HCL 200 MG PO TABS: 200.0000 mg | ORAL_TABLET | Freq: Two times a day (BID) | ORAL | Status: DC

## 2014-06-07 MED ORDER — APIXABAN 2.5 MG PO TABS: 2.5000 mg | ORAL_TABLET | Freq: Two times a day (BID) | ORAL | Status: DC

## 2014-06-07 NOTE — Patient Instructions (Signed)
Increase Amiodarone to 200 mg Twice daily   Start Eliquis 2.5 mg Twice daily   Stop Aspirin  Stop Plavix   Labs today  You have been referred to Dr Marval Regal, we will fax them the referral and they will call you to schedule appointment  Your physician recommends that you schedule a follow-up appointment in: 1 month with Dr Haroldine Laws

## 2014-06-07 NOTE — Progress Notes (Signed)
Patient ID: [name], male   DOB: [birth year], 80 y.o.   MRN: [id number] Urologist: Dr [name] HPI:  [name] is delightful 80 year old male with a history of coronary artery disease, status post previous large anterior wall myocardial infarction s/p multivessel stenting, systolic CHF with EF 123456 s/p St. Jude BiV ICD.  Remainder of his medical history is notable for atrial fibrillation,maintaining sinus rhythm on amiodarone.  He is not on Coumadin secondary to GI bleed, chronic renal insufficiency with baseline creatinine about 2.2-2.5, hypertension, hyperlipidemia, and a systemic tremor.Bladder cancer 05/2012. Completed BCG treatment.   In 2012 had NYHA III-IIIB symptoms. We discussed possibility of RHC and inotropes but he wanted to defer.   Last year found that his left ventricular lead has not been capturing suggestive of either fracture of the proximal electrode or an issue at the header. They reprogrammed the device to use to the RV coil which he tolerated well and had a good threshold.    ECHO 12/14 LVEF 20-25% Carotid u/s 3/14: 40-59% R 0-39%%L  He returns for follow up. Saw Dr. Caryl [name] recently and found to have more PAF on his ICD. Continues to have exertional fatigue NYHA III. This is particularly with any inclines. No CP. Mild edema. No palpitations. Weight down 5 pounds.   Labs 12/20/13: K 4.1 Cr 2.7 Hgb 11.9     ROS: All systems negative except as listed in HPI, PMH and Problem List.  Past Medical History  Diagnosis Date  . CAD (coronary artery disease)      a. s/p anterior MI 12/05 c/b shock -> stent LAD   b. s/p stenting OM-1, 2/06  . CHF (congestive heart failure)     due to ischemic CM  a. EF 20-30%. (Nov 2008)   b. s/p St. Jude BiV-ICD    c. CPX 07/2008  pvo2 16.3 (63% predicted) slope 34 RER 1.08 O2 pulse 93%  . Hyperlipidemia   . Hypothyroidism   . GERD (gastroesophageal reflux disease)     hx Barretts esophagitis  . Anemia   . Neuromuscular disorder    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"   no neurologist  . History of blood transfusion     following coumadin  usage  . Cough     occasional /productive, no fever/ not new  . Skin cancer     basal cells   facial x 4, 1 right forearm  . Bladder cancer dx'd 05/2012  . HTN (hypertension)     EKG 05/14/12,Chest x ray 8/13, last ICD interrogation 8/13 EPIC  . Atrial fibrillation or flutter     maintaining sinus on amiodarone    . Sleep apnea     STOP BANG SCORE 4  . CRI (chronic renal insufficiency)     (baseline 2.0-2.2)/ recent hospitalization 8/13  . 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 acetaminophen (TYLENOL) 325 MG tablet Take 2 tablets (650 mg total) by mouth every 6 (six) hours as needed for mild pain (or Fever >/= 101).      Marland Kitchen aspirin 325 MG tablet Take 325 mg by mouth daily.      . carvedilol (COREG) 6.25 MG tablet Take 1 tablet (6.25 mg total) by mouth 2 (two) times daily with a meal.  60 tablet  4  . clopidogrel (PLAVIX) 75 MG tablet TAKE ONE TABLET BY MOUTH ONCE DAILY  30 tablet  12  .  digoxin (LANOXIN) 0.125 MG tablet TAKE ONE-HALF TABLET BY MOUTH ONCE DAILY  15 tablet  6  . ferrous sulfate ([employer]S IRON) 325 (65 FE) MG tablet Take 325 mg by mouth every Monday, Wednesday, and Friday.       . furosemide (LASIX) 20 MG tablet Take 20 mg by mouth every Monday, Wednesday, and Friday.       . hydrOXYzine (ATARAX/VISTARIL) 25 MG tablet Take 1 tablet by mouth as needed.      Marland Kitchen KLOR-CON M10 10 MEQ tablet Take 10 mEq by mouth daily.       Marland Kitchen levothyroxine (SYNTHROID, LEVOTHROID) 50 MCG tablet Take 50 mcg by mouth daily before breakfast.       . Magnesium Hydroxide (MILK OF MAGNESIA PO) Take 20 mLs by mouth daily as needed (for constipation).      . Multiple Vitamins-Minerals (CENTRUM SILVER PO) Take 0.5 tablets by mouth 2 (two) times daily.      . nitroGLYCERIN (NITROSTAT) 0.4 MG SL tablet  Place 1 tablet (0.4 mg total) under the tongue every 5 (five) minutes as needed. For chest pain  25 tablet  2  . PACERONE 200 MG tablet TAKE ONE-HALF TABLET BY MOUTH EVERY DAY  45 tablet  3  . pantoprazole (PROTONIX) 40 MG tablet Take 40 mg by mouth daily.       . rosuvastatin (CRESTOR) 20 MG tablet Take 1 tablet (20 mg total) by mouth daily.  30 tablet  6  . tamsulosin (FLOMAX) 0.4 MG CAPS capsule Take 1 capsule by mouth at bedtime.      . valsartan (DIOVAN) 80 MG tablet Take 0.5 tablets (40 mg total) by mouth daily. Only if SBP > 100  30 tablet  6  . ZETIA 10 MG tablet TAKE ONE TABLET BY MOUTH EVERY DAY WITH  SUPPER  30 tablet  12   No current facility-administered medications for this encounter.     PHYSICAL EXAM: Filed Vitals:   06/07/14 0908  BP: 120/72  Pulse: 95    General:  Elderly.   no resp difficulty (wife present)  HEENT: normal Neck: supple. JVP 6 Carotids 2+ bilat; no bruits. Cor: PMI laterally displaced. Regular rate & rhythm. No rubs, gallops, 2/6 SEM murmur at apex. ICD site ok Lungs: clear with decreased air movement (mild) Abdomen: soft, nontender, distended  No hepatosplenomegaly. No bruits or masses. Good bowel sounds. Extremities: no cyanosis, clubbing, rash, edema. +vericose veins.  Neuro: alert & orientedx3, cranial nerves grossly intact. moves all 4 extremities w/o difficulty. affect pleasant   ASSESSMENT & PLAN: 1. Chronic systolic CHF    --He is worse today with NYHA III symptoms. Volume status ok. Unclear if his functional decline is related to his AF, worsening LV function, age or something else.     --Will attempt to keep in NSR by increasing amio. Check labs.     --Check labs today including digoxin level    --Echo at next visit. May need RHC  2. CAD     --No evidence of ischemia. Continue current regimen. Dr. Virgina [name] following lipids.  3. PAF       --Has more AF on ICD interrogation. I suspect this may be contributing to exertional fatigue. Increase  amio to 200 bid x 4weeks. Long talk about anticoagulation. Will start Eliquis 2.5 bid. Stop ASA & Plavix. Check LFTS and TSH 4. Carotid ultrasound - asx. Scan 1-39% bilaterally. No need for repeat.   5. CKD - stage 4 - will refer  to nephrology  Total time spent 45 minutes. Over half that time spent discussing above.    [name] Goetting,MD 9:13 AM

## 2014-06-07 NOTE — Addendum Note (Signed)
Encounter addended by: Scarlette Calico, RN on: 06/07/2014  9:44 AM<BR>     Documentation filed: Medications, Visit Diagnoses, Patient Instructions Section, Orders

## 2014-06-16 ENCOUNTER — Other Ambulatory Visit (HOSPITAL_COMMUNITY): Payer: Self-pay

## 2014-06-16 MED ORDER — NITROGLYCERIN 0.4 MG SL SUBL: 0.4000 mg | SUBLINGUAL_TABLET | SUBLINGUAL | Status: DC | PRN

## 2014-06-21 ENCOUNTER — Telehealth (HOSPITAL_COMMUNITY): Payer: Self-pay | Admitting: Vascular Surgery

## 2014-06-21 NOTE — Telephone Encounter (Signed)
Referral was faxed will f/u with [facility] 9/9 and let her know

## 2014-06-21 NOTE — Telephone Encounter (Signed)
When her husband was here she was told that he would get  appt w/ [facility] doctor she hasnt heard anymore about it ... Please advise

## 2014-06-22 MED ORDER — AMIODARONE HCL 200 MG PO TABS: 200.0000 mg | ORAL_TABLET | Freq: Every day | ORAL | Status: DC

## 2014-06-22 NOTE — Telephone Encounter (Signed)
Have left mess with [facility] to f/u on referral, spoke w/pt's wife she is aware I will call her when I hear back from them.  She also reports pt has been stumbling around at times, he seems to loose his balance and has almost fallen several times this started after last OV when amio was increased, discussed w/Dr Bensimhon and will cut amio back to 200 mg daily, wife agreeable

## 2014-06-28 DIAGNOSIS — N401 Enlarged prostate with lower urinary tract symptoms: Secondary | ICD-10-CM | POA: Diagnosis not present

## 2014-06-28 DIAGNOSIS — C670 Malignant neoplasm of trigone of bladder: Secondary | ICD-10-CM | POA: Diagnosis not present

## 2014-06-28 DIAGNOSIS — N289 Disorder of kidney and ureter, unspecified: Secondary | ICD-10-CM | POA: Diagnosis not present

## 2014-06-28 DIAGNOSIS — N138 Other obstructive and reflux uropathy: Secondary | ICD-10-CM | POA: Diagnosis not present

## 2014-06-29 ENCOUNTER — Other Ambulatory Visit: Payer: Self-pay | Admitting: Internal Medicine

## 2014-07-07 ENCOUNTER — Telehealth: Payer: Self-pay | Admitting: Cardiology

## 2014-07-07 ENCOUNTER — Encounter: Payer: Medicare Other | Admitting: *Deleted

## 2014-07-07 ENCOUNTER — Ambulatory Visit (HOSPITAL_COMMUNITY)
Admission: RE | Admit: 2014-07-07 | Discharge: 2014-07-07 | Disposition: A | Discharge status: Home / Self Care | Payer: Medicare Other | Source: Ambulatory Visit | Attending: Internal Medicine | Admitting: Internal Medicine

## 2014-07-07 VITALS — BP 108/58 | HR 64 | Wt 180.5 lb

## 2014-07-07 DIAGNOSIS — I129 Hypertensive chronic kidney disease with stage 1 through stage 4 chronic kidney disease, or unspecified chronic kidney disease: Secondary | ICD-10-CM | POA: Diagnosis not present

## 2014-07-07 DIAGNOSIS — Z95810 Presence of automatic (implantable) cardiac defibrillator: Secondary | ICD-10-CM | POA: Insufficient documentation

## 2014-07-07 DIAGNOSIS — M25519 Pain in unspecified shoulder: Secondary | ICD-10-CM | POA: Insufficient documentation

## 2014-07-07 DIAGNOSIS — E785 Hyperlipidemia, unspecified: Secondary | ICD-10-CM | POA: Insufficient documentation

## 2014-07-07 DIAGNOSIS — I480 Paroxysmal atrial fibrillation: Secondary | ICD-10-CM

## 2014-07-07 DIAGNOSIS — I5022 Chronic systolic (congestive) heart failure: Secondary | ICD-10-CM | POA: Insufficient documentation

## 2014-07-07 DIAGNOSIS — I4891 Unspecified atrial fibrillation: Secondary | ICD-10-CM | POA: Insufficient documentation

## 2014-07-07 DIAGNOSIS — I509 Heart failure, unspecified: Secondary | ICD-10-CM | POA: Diagnosis not present

## 2014-07-07 DIAGNOSIS — N184 Chronic kidney disease, stage 4 (severe): Secondary | ICD-10-CM | POA: Diagnosis not present

## 2014-07-07 DIAGNOSIS — I2510 Atherosclerotic heart disease of native coronary artery without angina pectoris: Secondary | ICD-10-CM

## 2014-07-07 DIAGNOSIS — Z8551 Personal history of malignant neoplasm of bladder: Secondary | ICD-10-CM | POA: Insufficient documentation

## 2014-07-07 DIAGNOSIS — I252 Old myocardial infarction: Secondary | ICD-10-CM | POA: Insufficient documentation

## 2014-07-07 NOTE — Patient Instructions (Signed)
Your physician recommends that you schedule a follow-up appointment in: 3 months.  

## 2014-07-07 NOTE — Progress Notes (Signed)
Patient ID: [name], male   DOB: December 03, 1931, 80 y.o.   MRN: [id number] Urologist: Dr [name] HPI:  [name] is delightful 80 year old male with a history of coronary artery disease, status post previous large anterior wall myocardial infarction s/p multivessel stenting, systolic CHF with EF 123456 s/p St. Jude BiV ICD.  Remainder of his medical history is notable for atrial fibrillation,maintaining sinus rhythm on amiodarone.  He is not on Coumadin secondary to GI bleed, chronic renal insufficiency with baseline creatinine about 2.2-2.5, hypertension, hyperlipidemia, and a systemic tremor.Bladder cancer 05/2012. Completed BCG treatment.   In 2012 had NYHA III-IIIB symptoms. We discussed possibility of RHC and inotropes but he wanted to defer.   Last year found that his left ventricular lead has not been capturing suggestive of either fracture of the proximal electrode or an issue at the header. They reprogrammed the device to use to the RV coil which he tolerated well and had a good threshold.    ECHO 12/14 LVEF 20-25% Carotid u/s 3/14: 40-59% R 0-39%%L  He returns for follow up. We saw him a month ago after he saw Dr. Caryl Comes who found more PAF on his ICD. He was feeling pretty rundown at that point. Amio increased to 200 bid. Labs looked ok. We started low-dose Eliquis for AF. Which he is tolerating well. No bleeding. Continues to have pain between his shoulder blades and neck. Sore to touch. Gets worse when he walks but did not bother when he mows the grass. Very different from previous angina. No swelling, orthopnea, PND.  Amio decreased to 200 daily due to wobbliness.   Labs 12/20/13: K 4.1 Cr 2.7 Hgb 11.9  06/07/14: K 4.5 Cr 2.0  ROS: All systems negative except as listed in HPI, PMH and Problem List.  Past Medical History  Diagnosis Date  . CAD (coronary artery disease)      a. s/p anterior MI 12/05 c/b shock -> stent LAD   b. s/p stenting OM-1, 2/06  . CHF (congestive heart failure)      due to ischemic CM  a. EF 20-30%. (Nov 2008)   b. s/p St. Jude BiV-ICD    c. CPX 07/2008  pvo2 16.3 (63% predicted) slope 34 RER 1.08 O2 pulse 93%  . Hyperlipidemia   . Hypothyroidism   . GERD (gastroesophageal reflux disease)     hx Barretts esophagitis  . Anemia   . 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 History of blood transfusion     following coumadin  usage  . Cough     occasional /productive, no fever/ not new  . Skin cancer     basal cells   facial x 4, 1 right forearm  . Bladder cancer dx'd 05/2012  . HTN (hypertension)     EKG 05/14/12,Chest x ray 8/13, last ICD interrogation 8/13 EPIC  . Atrial fibrillation or flutter     maintaining sinus on amiodarone    . Sleep apnea     STOP BANG SCORE 4  . CRI (chronic renal insufficiency)     (baseline 2.0-2.2)/ recent hospitalization 8/13  . 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 acetaminophen (TYLENOL) 325 MG tablet Take 2 tablets (650 mg total) by mouth every 6 (six) hours as needed for mild pain (or Fever >/= 101).      Marland Kitchen amiodarone (  PACERONE) 200 MG tablet Take 1 tablet (200 mg total) by mouth daily.  60 tablet  3  . apixaban (ELIQUIS) 2.5 MG TABS tablet Take 1 tablet (2.5 mg total) by mouth 2 (two) times daily.  60 tablet  6  . carvedilol (COREG) 6.25 MG tablet TAKE ONE TABLET BY MOUTH TWICE DAILY WITH MEALS  60 tablet  6  . digoxin (LANOXIN) 0.125 MG tablet TAKE ONE-HALF TABLET BY MOUTH ONCE DAILY  15 tablet  6  . ferrous sulfate (CVS IRON) 325 (65 FE) MG tablet Take 325 mg by mouth every Monday, Wednesday, and Friday.       . furosemide (LASIX) 20 MG tablet Take 20 mg by mouth daily.       . hydrOXYzine (ATARAX/VISTARIL) 25 MG tablet Take 1 tablet by mouth as needed.      Marland Kitchen KLOR-CON M10 10 MEQ tablet Take 10 mEq by mouth daily.       Marland Kitchen levothyroxine (SYNTHROID, LEVOTHROID)  50 MCG tablet Take 25 mcg by mouth daily before breakfast.       . Magnesium Hydroxide (MILK OF MAGNESIA PO) Take 20 mLs by mouth daily as needed (for constipation).      . Multiple Vitamins-Minerals (CENTRUM SILVER PO) Take 0.5 tablets by mouth 2 (two) times daily.      . nitroGLYCERIN (NITROSTAT) 0.4 MG SL tablet Place 1 tablet (0.4 mg total) under the tongue every 5 (five) minutes as needed. For chest pain  25 tablet  2  . pantoprazole (PROTONIX) 40 MG tablet Take 40 mg by mouth daily.       . rosuvastatin (CRESTOR) 20 MG tablet Take 1 tablet (20 mg total) by mouth daily.  30 tablet  6  . tamsulosin (FLOMAX) 0.4 MG CAPS capsule Take 1 capsule by mouth at bedtime.      . valsartan (DIOVAN) 80 MG tablet Take 0.5 tablets (40 mg total) by mouth daily. Only if SBP > 100  30 tablet  6  . ZETIA 10 MG tablet TAKE ONE TABLET BY MOUTH EVERY DAY WITH  SUPPER  30 tablet  12   No current facility-administered medications for this encounter.     PHYSICAL EXAM: Filed Vitals:   07/07/14 1357  BP: 108/58  Pulse: 64    General:  Elderly.   no resp difficulty (wife present)  HEENT: normal Neck: supple. JVP 6 Carotids 2+ bilat; no bruits. Cor: PMI laterally displaced. Regular rate & rhythm. No rubs, gallops, 2/6 SEM murmur at apex. ICD site ok Lungs: clear with decreased air movement (mild) Abdomen: soft, nontender, distended  No hepatosplenomegaly. No bruits or masses. Good bowel sounds. Extremities: no cyanosis, clubbing, rash, edema. +vericose veins.  Neuro: alert & orientedx3, cranial nerves grossly intact. moves all 4 extremities w/o difficulty. affect pleasant   ASSESSMENT & PLAN: 1. Chronic systolic CHF    --Stable with NYHA III symptoms. Volume status looks good.    -_Continue current regimen  2. Shoulder pain/CAD     --I think this is likely musculoskeletal but there does seem to be a partial exertional component. We discussed the possibility of a stress test but will hold off for now. If  pain persists and is exertional/reproducible he will call me and we will get a Myoview ASAP. With CKD would cath only if high risk Myoview.  3. PAF       --Has more AF on ICD interrogation. Did not tolerate increase in amio. Will continue 200 daily. Tolerating Eliquis well.  4. Carotid ultrasound - asx. Scan 1-39% bilaterally. No need for repeat.   5. CKD - stage 4 - will refer to nephrology   Benay Spice 2:15 PM

## 2014-07-07 NOTE — Addendum Note (Signed)
Encounter addended by: Scarlette Calico, RN on: 07/07/2014  2:32 PM<BR>     Documentation filed: Patient Instructions Section

## 2014-07-07 NOTE — Telephone Encounter (Signed)
LMOVM reminding pt to send remote transmission.   

## 2014-07-08 ENCOUNTER — Encounter: Payer: Self-pay | Admitting: Cardiology

## 2014-07-15 ENCOUNTER — Encounter: Payer: Self-pay | Admitting: Internal Medicine

## 2014-07-15 ENCOUNTER — Ambulatory Visit (INDEPENDENT_AMBULATORY_CARE_PROVIDER_SITE_OTHER): Payer: Medicare Other | Admitting: *Deleted

## 2014-07-15 DIAGNOSIS — I429 Cardiomyopathy, unspecified: Secondary | ICD-10-CM | POA: Diagnosis not present

## 2014-07-15 NOTE — Progress Notes (Signed)
Remote ICD transmission.   

## 2014-07-18 LAB — MDC_IDC_ENUM_SESS_TYPE_REMOTE
Battery Remaining Longevity: 58 mo
Battery Voltage: 3.01 V
Brady Statistic AS VS Percent: 1 %
Brady Statistic RA Percent Paced: 93 %
Date Time Interrogation Session: 20151002194816
HIGH POWER IMPEDANCE MEASURED VALUE: 47 Ohm
HighPow Impedance: 47 Ohm
Implantable Pulse Generator Serial Number: 7138995
Lead Channel Impedance Value: 390 Ohm
Lead Channel Impedance Value: 450 Ohm
Lead Channel Pacing Threshold Amplitude: 0.75 V
Lead Channel Pacing Threshold Pulse Width: 0.5 ms
Lead Channel Sensing Intrinsic Amplitude: 12 mV
Lead Channel Sensing Intrinsic Amplitude: 3.6 mV
Lead Channel Setting Pacing Amplitude: 2 V
Lead Channel Setting Pacing Amplitude: 2.5 V
Lead Channel Setting Pacing Pulse Width: 0.8 ms
MDC IDC MSMT BATTERY REMAINING PERCENTAGE: 85 %
MDC IDC MSMT LEADCHNL LV PACING THRESHOLD AMPLITUDE: 1 V
MDC IDC MSMT LEADCHNL LV PACING THRESHOLD PULSEWIDTH: 0.8 ms
MDC IDC MSMT LEADCHNL RA IMPEDANCE VALUE: 410 Ohm
MDC IDC MSMT LEADCHNL RV PACING THRESHOLD AMPLITUDE: 1 V
MDC IDC MSMT LEADCHNL RV PACING THRESHOLD PULSEWIDTH: 0.8 ms
MDC IDC SET LEADCHNL RA PACING AMPLITUDE: 2 V
MDC IDC SET LEADCHNL RV PACING PULSEWIDTH: 0.8 ms
MDC IDC SET LEADCHNL RV SENSING SENSITIVITY: 0.5 mV
MDC IDC STAT BRADY AP VP PERCENT: 99 %
MDC IDC STAT BRADY AP VS PERCENT: 1 %
MDC IDC STAT BRADY AS VP PERCENT: 1 %
Zone Setting Detection Interval: 250 ms
Zone Setting Detection Interval: 300 ms

## 2014-07-19 DIAGNOSIS — R8299 Other abnormal findings in urine: Secondary | ICD-10-CM | POA: Diagnosis not present

## 2014-07-19 DIAGNOSIS — E039 Hypothyroidism, unspecified: Secondary | ICD-10-CM | POA: Diagnosis not present

## 2014-07-19 DIAGNOSIS — I10 Essential (primary) hypertension: Secondary | ICD-10-CM | POA: Diagnosis not present

## 2014-07-19 DIAGNOSIS — R739 Hyperglycemia, unspecified: Secondary | ICD-10-CM | POA: Diagnosis not present

## 2014-07-19 DIAGNOSIS — E785 Hyperlipidemia, unspecified: Secondary | ICD-10-CM | POA: Diagnosis not present

## 2014-07-19 DIAGNOSIS — I2510 Atherosclerotic heart disease of native coronary artery without angina pectoris: Secondary | ICD-10-CM | POA: Diagnosis not present

## 2014-07-19 DIAGNOSIS — Z125 Encounter for screening for malignant neoplasm of prostate: Secondary | ICD-10-CM | POA: Diagnosis not present

## 2014-07-19 DIAGNOSIS — Z23 Encounter for immunization: Secondary | ICD-10-CM | POA: Diagnosis not present

## 2014-07-21 ENCOUNTER — Telehealth (HOSPITAL_COMMUNITY): Payer: Self-pay | Admitting: Vascular Surgery

## 2014-07-21 NOTE — Telephone Encounter (Signed)
Pt wife called pt has some concerns about a side affect of one of his  Medicines..please advise

## 2014-07-21 NOTE — Telephone Encounter (Signed)
fa

## 2014-07-22 NOTE — Telephone Encounter (Signed)
Patient states his eyes are hard to get open in the morning.  No pain noted, just states "theyre hard to get open in the morning and aggravated after watching tv for awhile".  Recently started this past week.  Last med change noted for him was starting eliquis 6 weeks ago.  Advised that this is doubtful to be medication related, and might need to f/u with PCP for general eye exam.

## 2014-07-26 DIAGNOSIS — N184 Chronic kidney disease, stage 4 (severe): Secondary | ICD-10-CM | POA: Diagnosis not present

## 2014-07-26 DIAGNOSIS — Z0000 Encounter for general adult medical examination without abnormal findings: Secondary | ICD-10-CM | POA: Diagnosis not present

## 2014-07-26 DIAGNOSIS — R739 Hyperglycemia, unspecified: Secondary | ICD-10-CM | POA: Diagnosis not present

## 2014-07-26 DIAGNOSIS — R040 Epistaxis: Secondary | ICD-10-CM | POA: Diagnosis not present

## 2014-07-26 DIAGNOSIS — I6529 Occlusion and stenosis of unspecified carotid artery: Secondary | ICD-10-CM | POA: Diagnosis not present

## 2014-07-26 DIAGNOSIS — I739 Peripheral vascular disease, unspecified: Secondary | ICD-10-CM | POA: Diagnosis not present

## 2014-07-26 DIAGNOSIS — Z1389 Encounter for screening for other disorder: Secondary | ICD-10-CM | POA: Diagnosis not present

## 2014-07-26 DIAGNOSIS — I252 Old myocardial infarction: Secondary | ICD-10-CM | POA: Diagnosis not present

## 2014-07-26 DIAGNOSIS — Z950 Presence of cardiac pacemaker: Secondary | ICD-10-CM | POA: Diagnosis not present

## 2014-07-26 DIAGNOSIS — Z1212 Encounter for screening for malignant neoplasm of rectum: Secondary | ICD-10-CM | POA: Diagnosis not present

## 2014-07-28 ENCOUNTER — Other Ambulatory Visit: Payer: Self-pay | Admitting: Dermatology

## 2014-07-28 DIAGNOSIS — C44212 Basal cell carcinoma of skin of right ear and external auricular canal: Secondary | ICD-10-CM | POA: Diagnosis not present

## 2014-07-28 DIAGNOSIS — D485 Neoplasm of uncertain behavior of skin: Secondary | ICD-10-CM | POA: Diagnosis not present

## 2014-07-28 DIAGNOSIS — C44319 Basal cell carcinoma of skin of other parts of face: Secondary | ICD-10-CM | POA: Diagnosis not present

## 2014-07-28 DIAGNOSIS — L570 Actinic keratosis: Secondary | ICD-10-CM | POA: Diagnosis not present

## 2014-07-31 IMAGING — US US RENAL
1 series · 14 of 25 positions shown · non-contrast
Comparison: CT cystogram obtained same date.  No prior renal
imaging.

CLINICAL DATA: Indwelling bilateral ureteral stents.  2 days post
resection of bladder tumor.

RENAL/URINARY TRACT ULTRASOUND COMPLETE

[Series 1: us renal · 0.32mm/px · 14 of 25 slices shown]
[im 1/25]
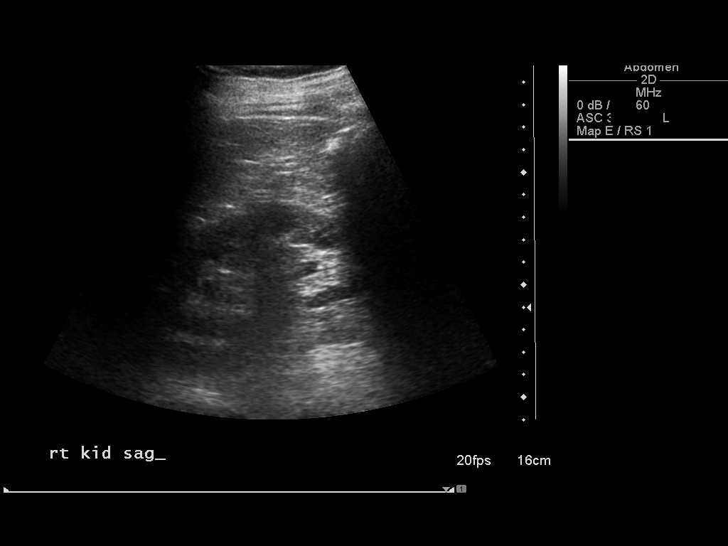
[im 3/25]
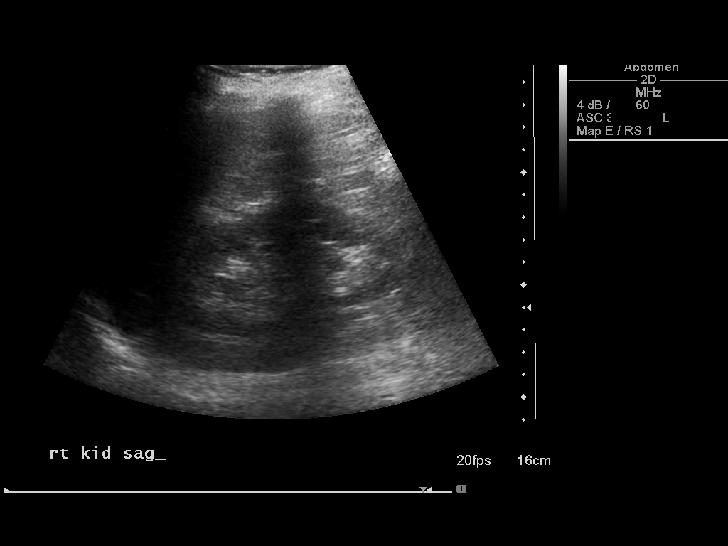
[im 5/25]
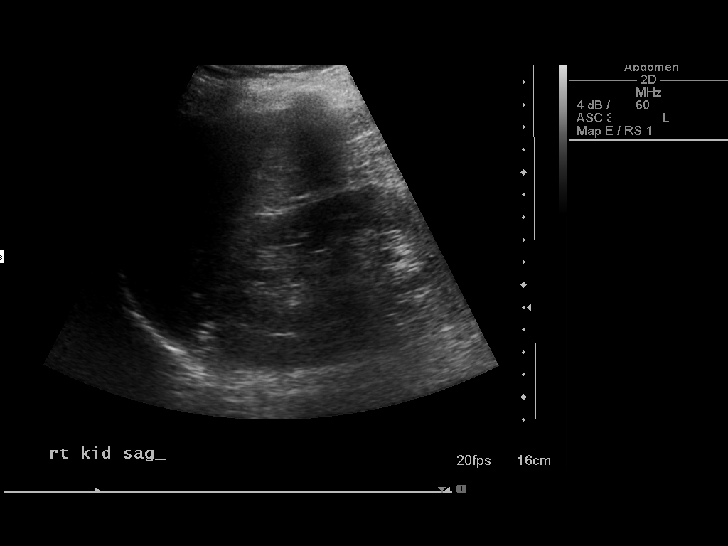
[im 7/25]
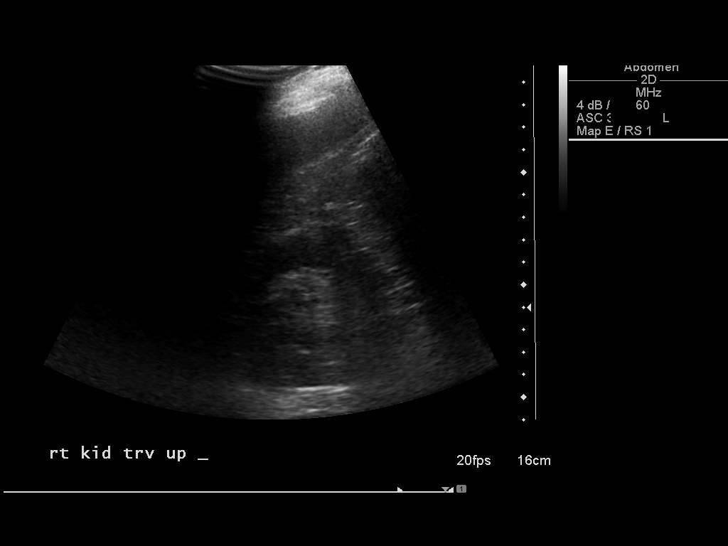
[im 9/25]
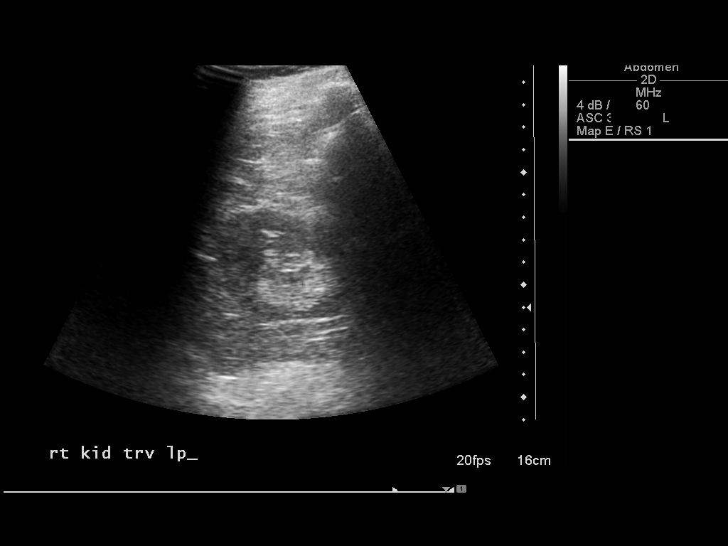
[im 10/25]
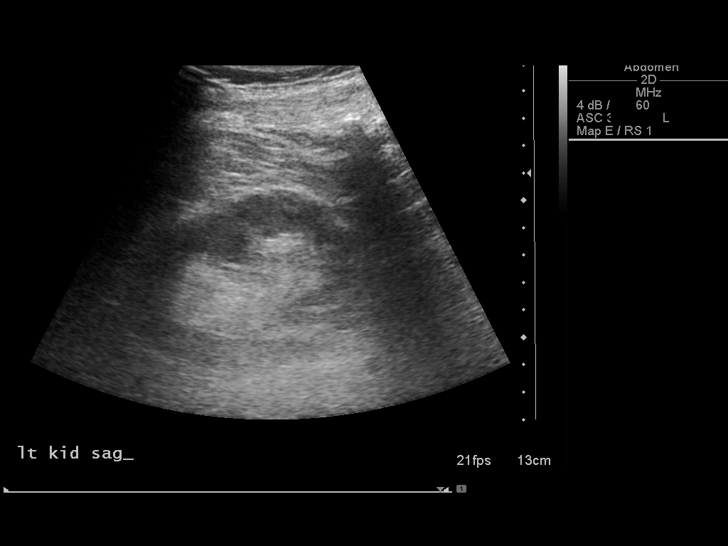
[im 12/25]
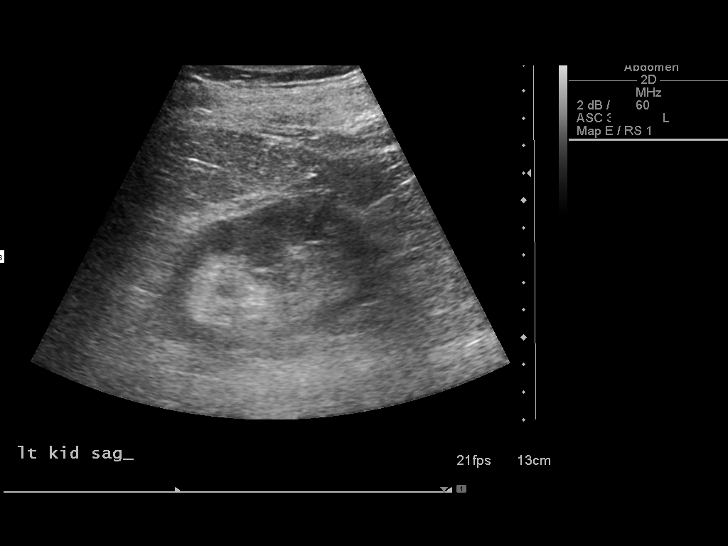
[im 14/25]
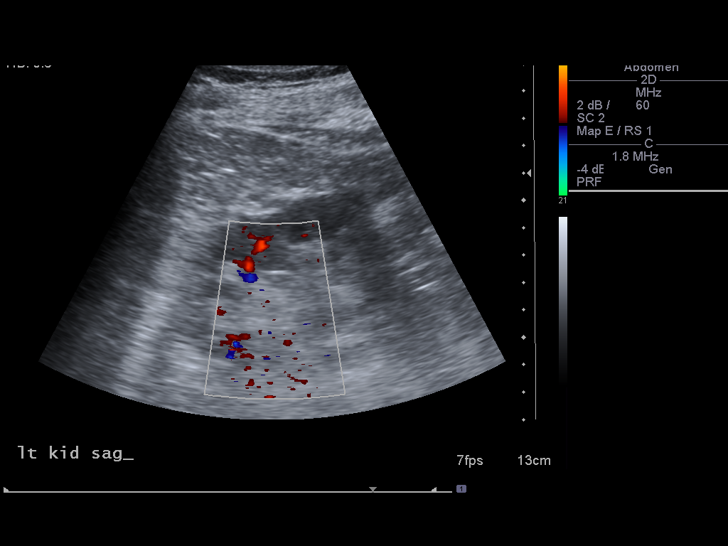
[im 16/25]
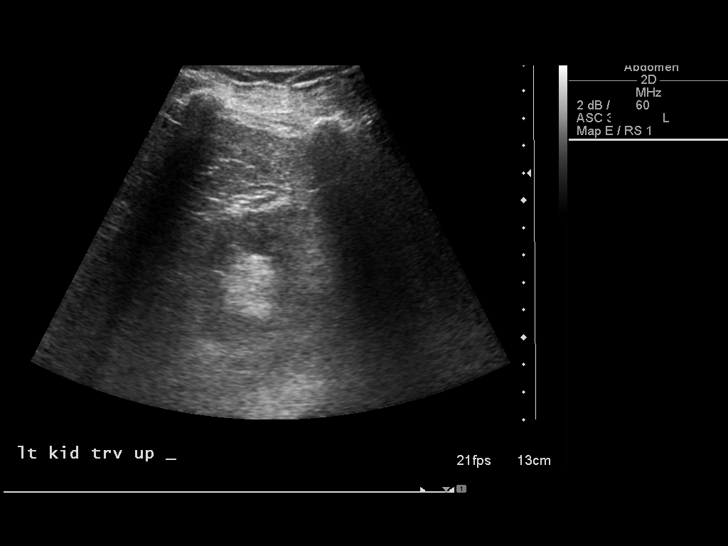
[im 17/25]
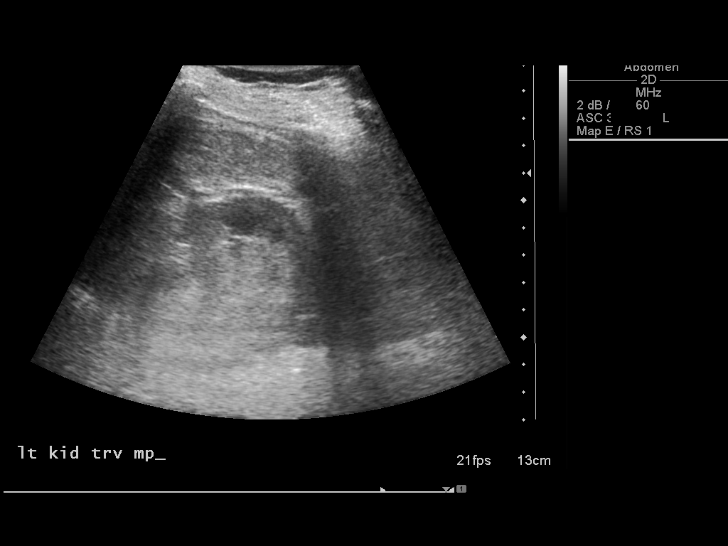
[im 19/25]
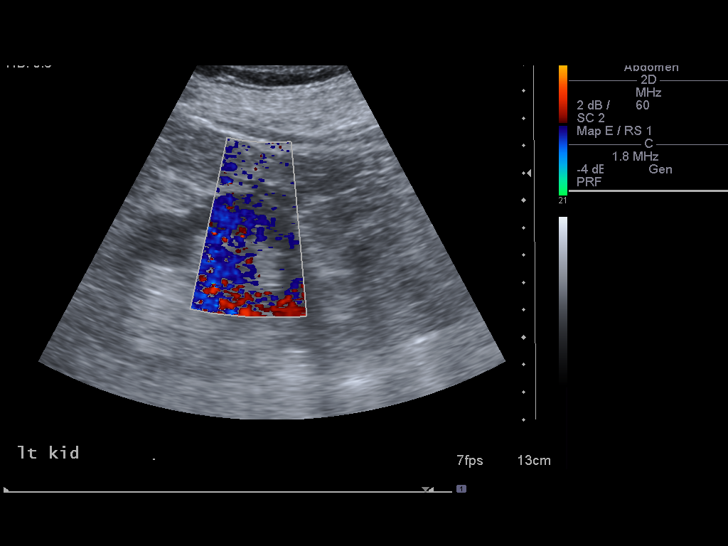
[im 21/25]
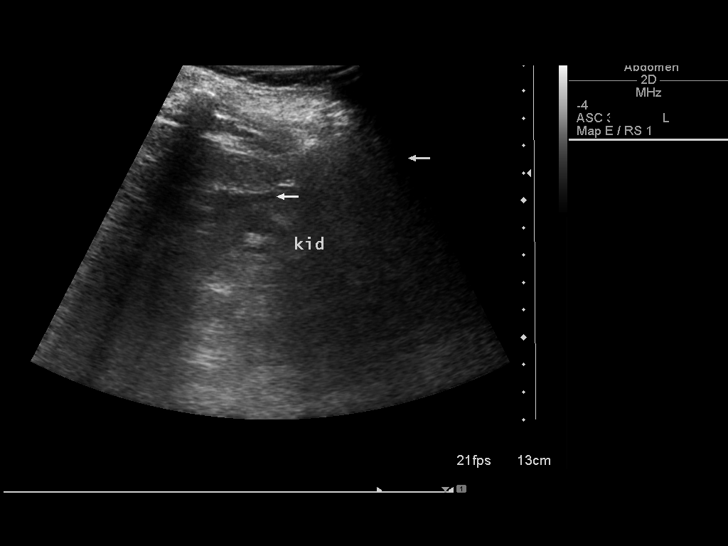
[im 23/25]
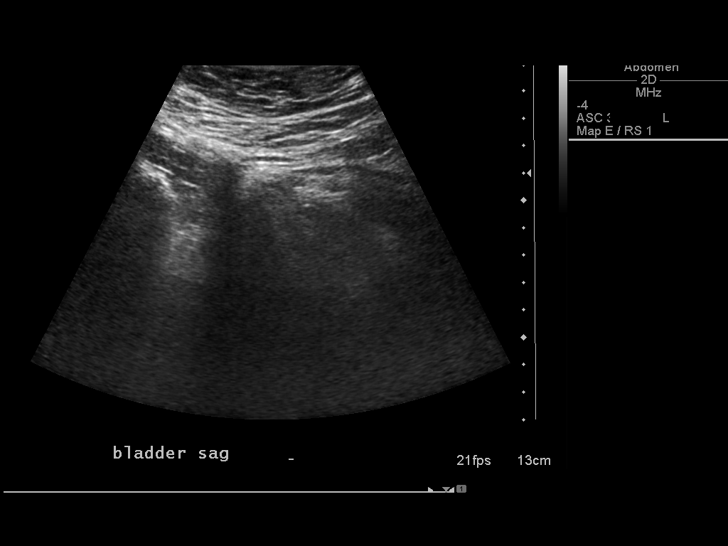
[im 25/25]
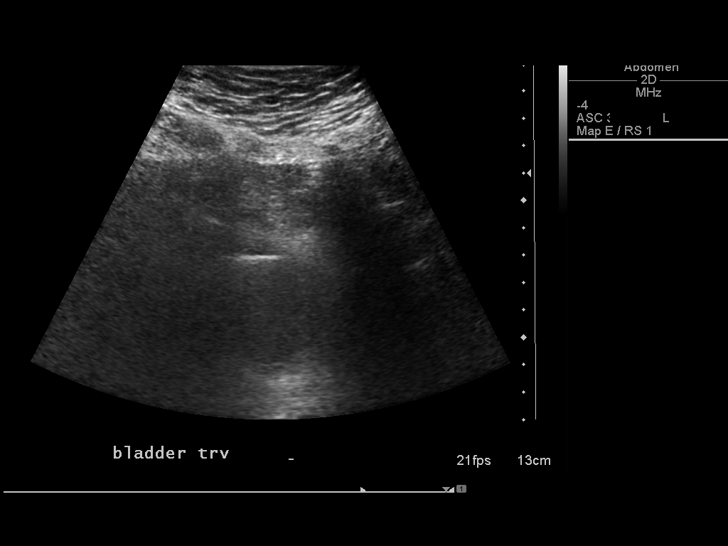

[14 of 25 positions shown; findings below may reference images not displayed]

FINDINGS: Right Kidney:  No hydronephrosis.  Mild diffuse cortical thinning.
No focal parenchymal abnormality.  Normal parenchymal echotexture.
No shadowing calculi.  Approximately 10.6 cm in length.

Left Kidney:  No hydronephrosis.  Mild diffuse cortical thinning.
No focal parenchymal abnormality.  Normal parenchymal echotexture.
No shadowing calculi.  Approximate 9.2 cm in length.

Bladder:  Decompressed by Foley catheter.
IMPRESSION: No evidence of hydronephrosis involving either kidney.  Mild
diffuse cortical thinning involving both kidneys consistent with
age.

## 2014-07-31 IMAGING — CR DG ABDOMEN 1V
1 series · 2 of 2 positions shown · non-contrast
Comparison: None

CLINICAL DATA: Generalized abdominal pain.  Abdominal distention.

ABDOMEN - 1 VIEW

[Series 1: ap (kub) · U · 2 of 2 slices shown]
[im 1/2]
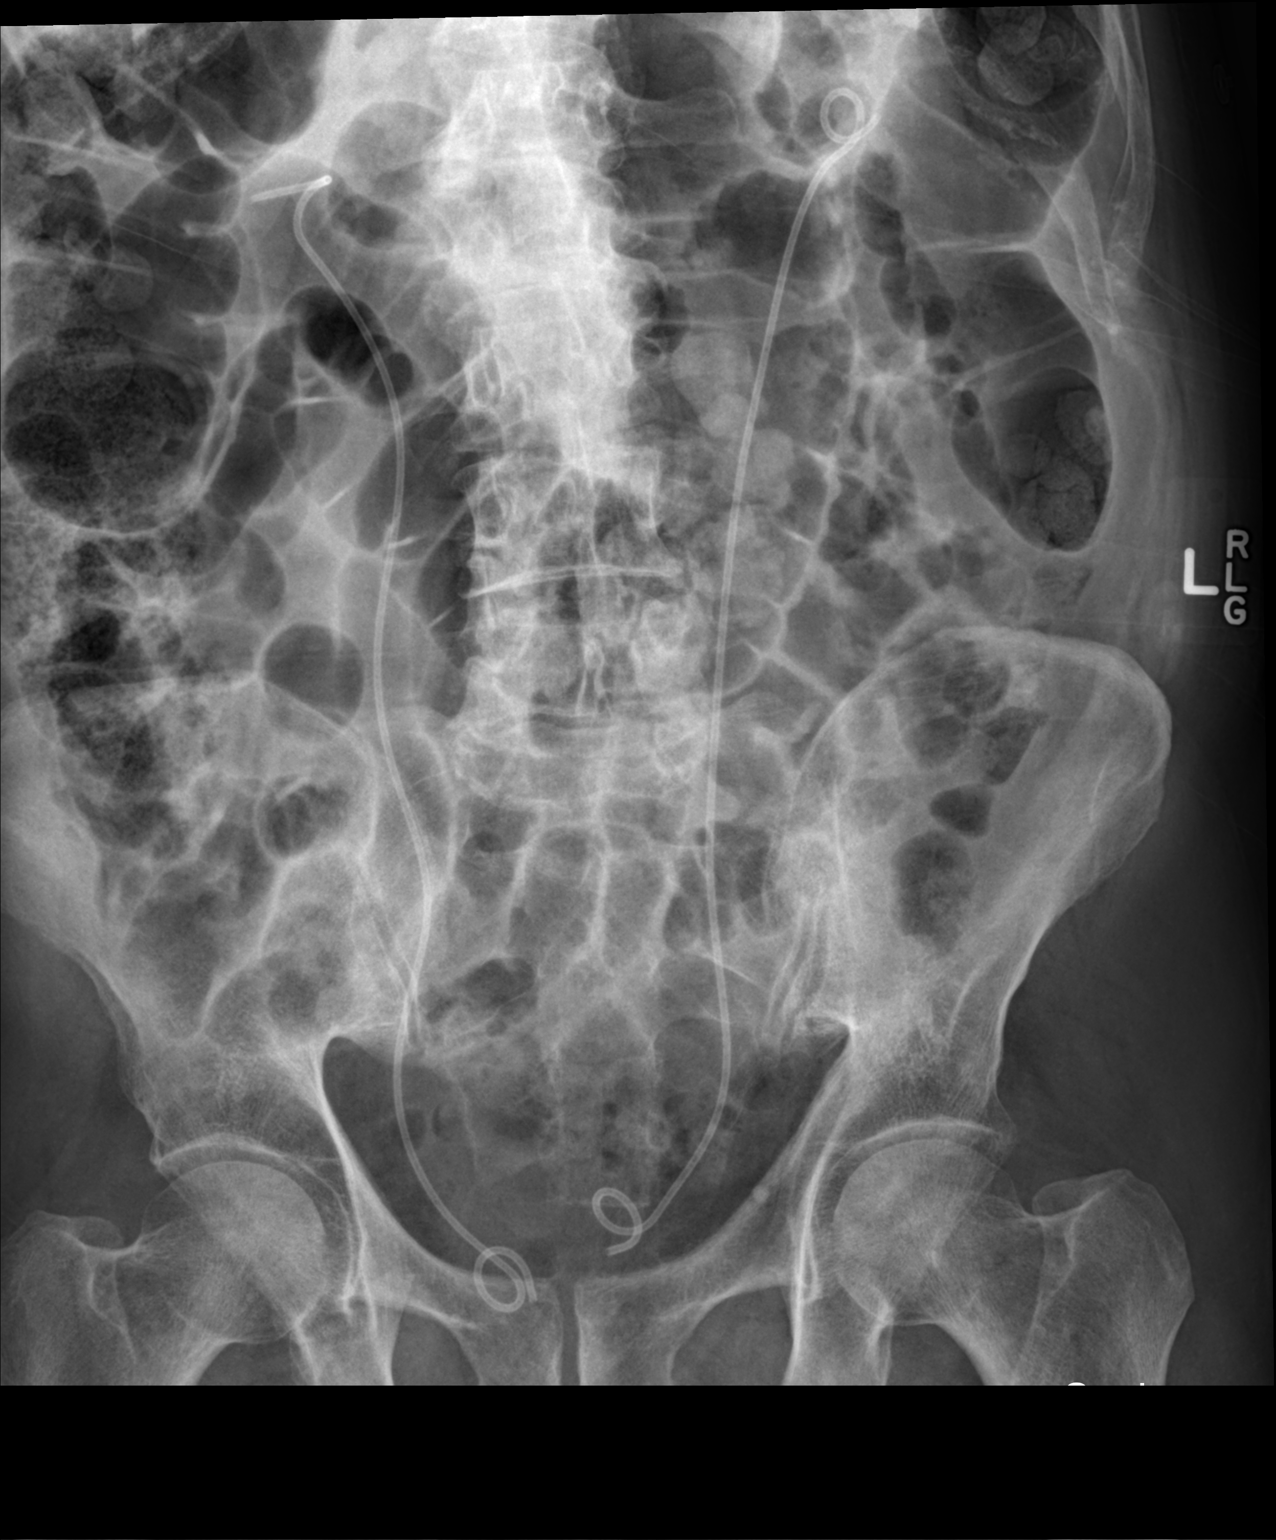
[im 2/2]
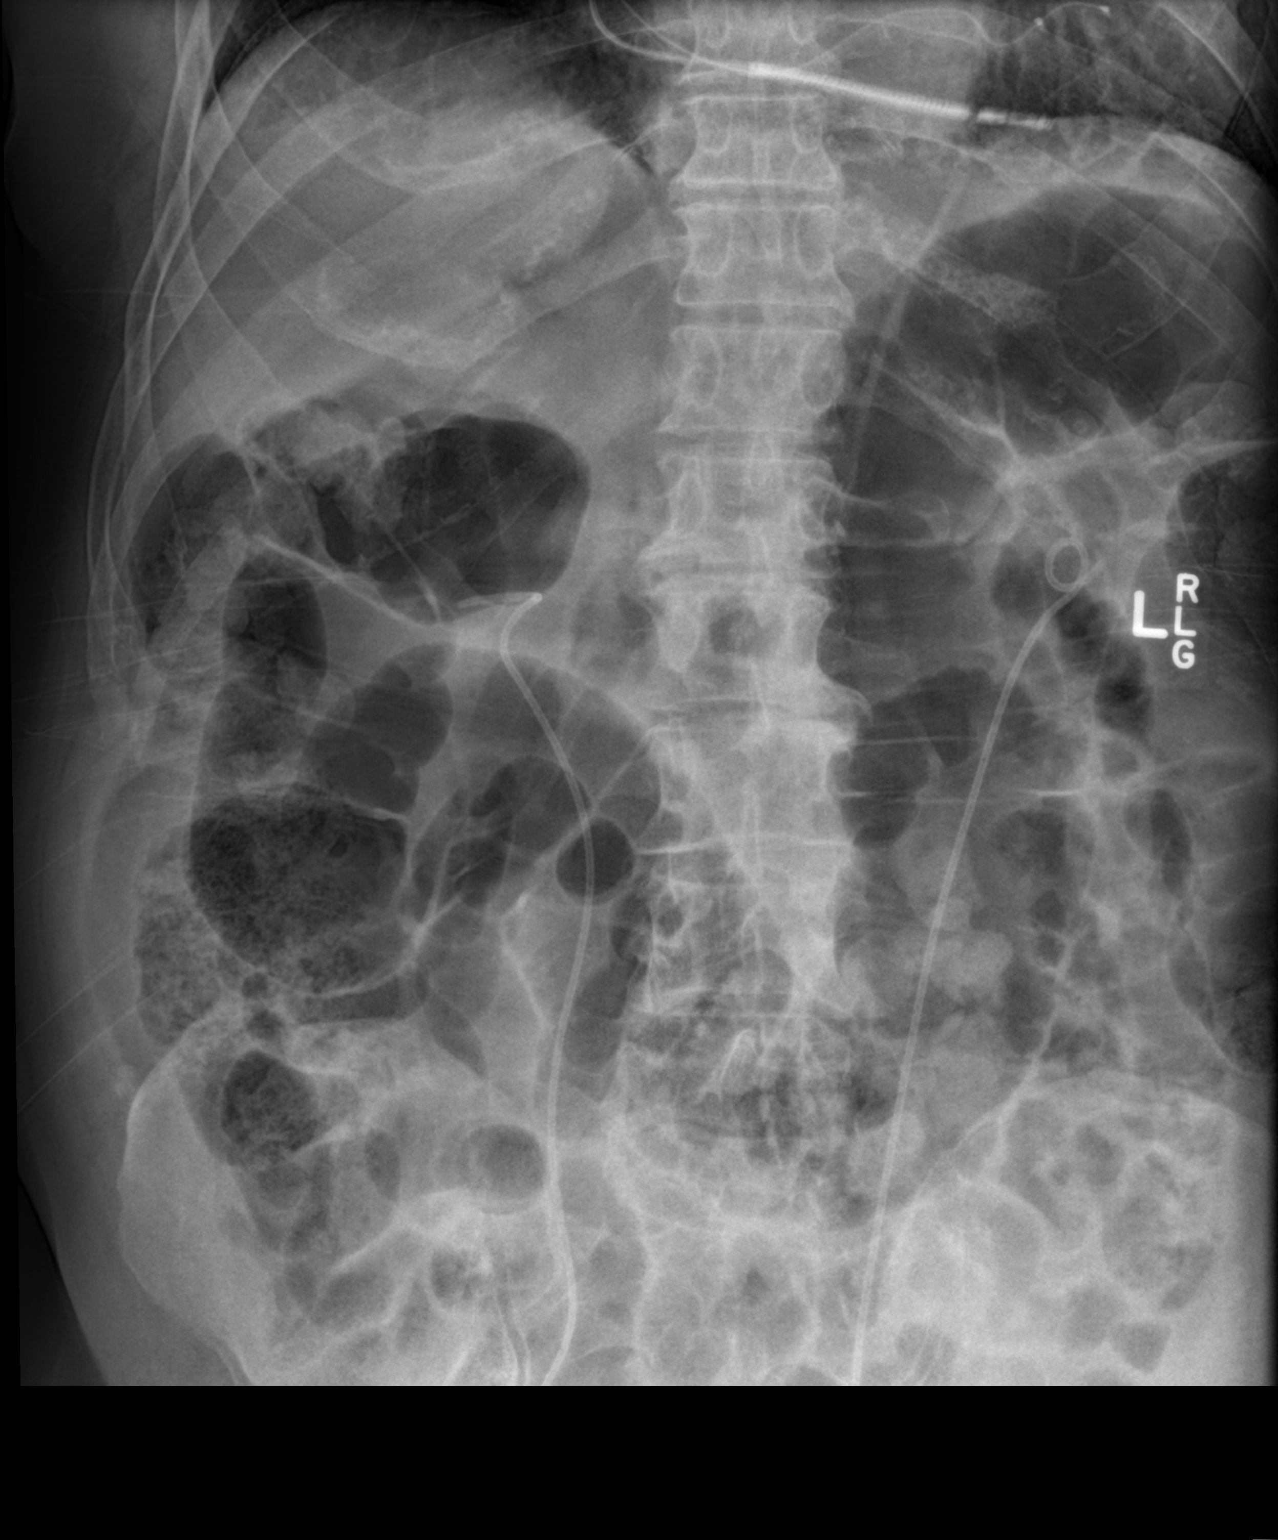

[2 of 2 positions shown; findings below may reference images not displayed]

FINDINGS: Bilateral ureteral stents in place.  There is diffuse
gaseous distention of bowel suggesting ileus.  No definite bowel
obstruction.  No free air or visible organomegaly.  No acute bony
abnormality.
IMPRESSION: Diffuse gaseous distention of bowel suggests ileus.

Bilateral ureteral stents.

## 2014-08-02 ENCOUNTER — Encounter: Payer: Self-pay | Admitting: Cardiology

## 2014-08-02 DIAGNOSIS — I2510 Atherosclerotic heart disease of native coronary artery without angina pectoris: Secondary | ICD-10-CM | POA: Diagnosis not present

## 2014-08-02 DIAGNOSIS — I4891 Unspecified atrial fibrillation: Secondary | ICD-10-CM | POA: Diagnosis not present

## 2014-08-02 DIAGNOSIS — N184 Chronic kidney disease, stage 4 (severe): Secondary | ICD-10-CM | POA: Diagnosis not present

## 2014-08-02 DIAGNOSIS — I10 Essential (primary) hypertension: Secondary | ICD-10-CM | POA: Diagnosis not present

## 2014-08-16 DIAGNOSIS — D472 Monoclonal gammopathy: Secondary | ICD-10-CM | POA: Diagnosis not present

## 2014-09-12 DIAGNOSIS — C44212 Basal cell carcinoma of skin of right ear and external auricular canal: Secondary | ICD-10-CM | POA: Diagnosis not present

## 2014-09-12 DIAGNOSIS — Z85828 Personal history of other malignant neoplasm of skin: Secondary | ICD-10-CM | POA: Diagnosis not present

## 2014-09-12 DIAGNOSIS — C44319 Basal cell carcinoma of skin of other parts of face: Secondary | ICD-10-CM | POA: Diagnosis not present

## 2014-09-22 ENCOUNTER — Encounter (HOSPITAL_COMMUNITY): Payer: Self-pay | Admitting: Internal Medicine

## 2014-10-03 ENCOUNTER — Encounter (HOSPITAL_COMMUNITY): Payer: Medicare Other

## 2014-10-17 ENCOUNTER — Telehealth (HOSPITAL_COMMUNITY): Payer: Self-pay | Admitting: Vascular Surgery

## 2014-10-17 NOTE — Telephone Encounter (Signed)
Pt wife called pt is running 6 days short on his Carvedilol, she wants someone to call back to ok that prescription

## 2014-10-17 NOTE — Telephone Encounter (Signed)
Spoke with pts wife Verbal given to [facility] to ok early refill

## 2014-10-19 ENCOUNTER — Telehealth: Payer: Self-pay | Admitting: Internal Medicine

## 2014-10-19 ENCOUNTER — Ambulatory Visit (INDEPENDENT_AMBULATORY_CARE_PROVIDER_SITE_OTHER): Payer: Medicare Other | Admitting: *Deleted

## 2014-10-19 DIAGNOSIS — I429 Cardiomyopathy, unspecified: Secondary | ICD-10-CM | POA: Diagnosis not present

## 2014-10-19 NOTE — Progress Notes (Signed)
Remote ICD transmission.   

## 2014-10-19 NOTE — Telephone Encounter (Signed)
Informed [name] that pt's device will send auto transmissions and one was received this am. Romie Minus voiced understanding.

## 2014-10-19 NOTE — Telephone Encounter (Signed)
New Msg        Pt attempting to do pacer check and not sure they have the right number to call.   Please contact pt.

## 2014-10-21 LAB — MDC_IDC_ENUM_SESS_TYPE_REMOTE
Battery Remaining Percentage: 81 %
Brady Statistic AS VP Percent: 1 %
Brady Statistic AS VS Percent: 1 %
Brady Statistic RA Percent Paced: 96 %
Date Time Interrogation Session: 20160106070017
HighPow Impedance: 48 Ohm
HighPow Impedance: 48 Ohm
Implantable Pulse Generator Serial Number: 7138995
Lead Channel Impedance Value: 350 Ohm
Lead Channel Pacing Threshold Amplitude: 1 V
Lead Channel Sensing Intrinsic Amplitude: 3.9 mV
Lead Channel Setting Pacing Amplitude: 2 V
Lead Channel Setting Pacing Amplitude: 2 V
Lead Channel Setting Pacing Amplitude: 2.5 V
Lead Channel Setting Pacing Pulse Width: 0.8 ms
Lead Channel Setting Sensing Sensitivity: 0.5 mV
MDC IDC MSMT BATTERY REMAINING LONGEVITY: 55 mo
MDC IDC MSMT BATTERY VOLTAGE: 2.99 V
MDC IDC MSMT LEADCHNL LV IMPEDANCE VALUE: 450 Ohm
MDC IDC MSMT LEADCHNL LV PACING THRESHOLD AMPLITUDE: 1 V
MDC IDC MSMT LEADCHNL LV PACING THRESHOLD PULSEWIDTH: 0.8 ms
MDC IDC MSMT LEADCHNL RA IMPEDANCE VALUE: 390 Ohm
MDC IDC MSMT LEADCHNL RA PACING THRESHOLD AMPLITUDE: 0.75 V
MDC IDC MSMT LEADCHNL RA PACING THRESHOLD PULSEWIDTH: 0.5 ms
MDC IDC MSMT LEADCHNL RV PACING THRESHOLD PULSEWIDTH: 0.8 ms
MDC IDC MSMT LEADCHNL RV SENSING INTR AMPL: 12 mV
MDC IDC SET LEADCHNL LV PACING PULSEWIDTH: 0.8 ms
MDC IDC SET ZONE DETECTION INTERVAL: 250 ms
MDC IDC SET ZONE DETECTION INTERVAL: 300 ms
MDC IDC STAT BRADY AP VP PERCENT: 99 %
MDC IDC STAT BRADY AP VS PERCENT: 1 %
MDC IDC STAT BRADY RV PERCENT PACED: 99 %

## 2014-10-28 ENCOUNTER — Telehealth (HOSPITAL_COMMUNITY): Payer: Self-pay | Admitting: Vascular Surgery

## 2014-10-28 DIAGNOSIS — Z4502 Encounter for adjustment and management of automatic implantable cardiac defibrillator: Secondary | ICD-10-CM

## 2014-10-28 DIAGNOSIS — I255 Ischemic cardiomyopathy: Secondary | ICD-10-CM

## 2014-10-28 DIAGNOSIS — I4891 Unspecified atrial fibrillation: Secondary | ICD-10-CM

## 2014-10-28 DIAGNOSIS — I5022 Chronic systolic (congestive) heart failure: Secondary | ICD-10-CM

## 2014-10-28 DIAGNOSIS — Z95810 Presence of automatic (implantable) cardiac defibrillator: Secondary | ICD-10-CM

## 2014-10-28 MED ORDER — VALSARTAN 80 MG PO TABS: 40.0000 mg | ORAL_TABLET | Freq: Every day | ORAL | Status: DC

## 2014-10-28 NOTE — Telephone Encounter (Signed)
As requested med refills return to pharmacy

## 2014-10-28 NOTE — Telephone Encounter (Signed)
Pt wife called new prescription Valsartan .Marland Kitchen Please advise

## 2014-11-03 ENCOUNTER — Encounter (HOSPITAL_COMMUNITY): Payer: Self-pay

## 2014-11-03 ENCOUNTER — Ambulatory Visit (HOSPITAL_COMMUNITY)
Admission: RE | Admit: 2014-11-03 | Discharge: 2014-11-03 | Disposition: A | Discharge status: Home / Self Care | Payer: Medicare Other | Source: Ambulatory Visit | Attending: Internal Medicine | Admitting: Internal Medicine

## 2014-11-03 VITALS — BP 117/61 | HR 82 | Resp 18 | Wt 190.5 lb

## 2014-11-03 DIAGNOSIS — K219 Gastro-esophageal reflux disease without esophagitis: Secondary | ICD-10-CM | POA: Diagnosis not present

## 2014-11-03 DIAGNOSIS — Z79899 Other long term (current) drug therapy: Secondary | ICD-10-CM | POA: Diagnosis not present

## 2014-11-03 DIAGNOSIS — N184 Chronic kidney disease, stage 4 (severe): Secondary | ICD-10-CM | POA: Insufficient documentation

## 2014-11-03 DIAGNOSIS — E039 Hypothyroidism, unspecified: Secondary | ICD-10-CM | POA: Insufficient documentation

## 2014-11-03 DIAGNOSIS — I252 Old myocardial infarction: Secondary | ICD-10-CM | POA: Diagnosis not present

## 2014-11-03 DIAGNOSIS — I2510 Atherosclerotic heart disease of native coronary artery without angina pectoris: Secondary | ICD-10-CM | POA: Diagnosis not present

## 2014-11-03 DIAGNOSIS — I129 Hypertensive chronic kidney disease with stage 1 through stage 4 chronic kidney disease, or unspecified chronic kidney disease: Secondary | ICD-10-CM | POA: Diagnosis not present

## 2014-11-03 DIAGNOSIS — G4730 Sleep apnea, unspecified: Secondary | ICD-10-CM | POA: Diagnosis not present

## 2014-11-03 DIAGNOSIS — I480 Paroxysmal atrial fibrillation: Secondary | ICD-10-CM | POA: Diagnosis not present

## 2014-11-03 DIAGNOSIS — E785 Hyperlipidemia, unspecified: Secondary | ICD-10-CM | POA: Insufficient documentation

## 2014-11-03 DIAGNOSIS — I5022 Chronic systolic (congestive) heart failure: Secondary | ICD-10-CM

## 2014-11-03 DIAGNOSIS — R251 Tremor, unspecified: Secondary | ICD-10-CM | POA: Insufficient documentation

## 2014-11-03 MED ORDER — AMIODARONE HCL 100 MG PO TABS: 100.0000 mg | ORAL_TABLET | Freq: Every day | ORAL | Status: DC

## 2014-11-03 NOTE — Patient Instructions (Signed)
DECREASE Amiodarone to 100mg  tablet once daily.  Follow up 4 months.  Do the following things EVERYDAY: 1) Weigh yourself in the morning before breakfast. Write it down and keep it in a log. 2) Take your medicines as prescribed 3) Eat low salt foods-Limit salt (sodium) to 2000 mg per day.  4) Stay as active as you can everyday 5) Limit all fluids for the day to less than 2 liters

## 2014-11-03 NOTE — Progress Notes (Signed)
Patient ID: SRUJAN VALERA, male   DOB: 1931/12/23, 81 y.o.   MRN: [id number] Urologist: Dr [name] HPI:  Kentarius is delightful 81 year old male with a history of coronary artery disease, status post previous large anterior wall myocardial infarction s/p multivessel stenting, systolic CHF with EF [id number] s/p St. Jude BiV ICD.  Remainder of his medical history is notable for atrial fibrillation,maintaining sinus rhythm on amiodarone.  He is not on Coumadin secondary to GI bleed, chronic renal insufficiency with baseline creatinine about 2.2-2.5, hypertension, hyperlipidemia, and a systemic tremor.Bladder cancer 05/2012. Completed BCG treatment.   In 2012 had NYHA III-IIIB symptoms. We discussed possibility of RHC and inotropes but he wanted to defer.   Last year found that his left ventricular lead has not been capturing suggestive of either fracture of the proximal electrode or an issue at the header. They reprogrammed the device to use to the RV coil which he tolerated well and had a good threshold.    ECHO 12/14 LVEF 20-25% Carotid u/s 3/14: 40-59% R 0-39%%L  He returns for follow up. Saw Dr. Caryl Comes in 6/15  who found more PAF on his ICD. He was feeling pretty rundown at that point. Amio increased to 200 bid. Eventually decreased to 200 daily due to weakness. We started low-dose Eliquis for AF. Which he is tolerating well. Two complaints today. Feels like he is having a bit more exertional fatigue. Has chronic DOE without change. Able to do all ADLs without problem. No edema. Also c/o worsening tremor. Had a fall yesterday while carrying wood into basement.   Labs 12/20/13: K 4.1 Cr 2.7 Hgb 11.9  06/07/14: K 4.5 Cr 2.0  ROS: All systems negative except as listed in HPI, PMH and Problem List.  Past Medical History  Diagnosis Date  . CAD (coronary artery disease)      a. s/p anterior MI 12/05 c/b shock -> stent LAD   b. s/p stenting OM-1, 2/06  . CHF (congestive heart failure)     due to ischemic  CM  a. EF 20-30%. (Nov 2008)   b. s/p St. Jude BiV-ICD    c. CPX 07/2008  pvo2 16.3 (63% predicted) slope 34 RER 1.08 O2 pulse 93%  . Hyperlipidemia   . Hypothyroidism   . GERD (gastroesophageal reflux disease)     hx Barretts esophagitis  . Anemia   . 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 History of blood transfusion     following coumadin  usage  . Cough     occasional /productive, no fever/ not new  . Skin cancer     basal cells   facial x 4, 1 right forearm  . Bladder cancer dx'd 05/2012  . HTN (hypertension)     EKG 05/14/12,Chest x ray 8/13, last ICD interrogation 8/13 EPIC  . Atrial fibrillation or flutter     maintaining sinus on amiodarone    . Sleep apnea     STOP BANG SCORE 4  . CRI (chronic renal insufficiency)     (baseline 2.0-2.2)/ recent hospitalization 8/13  . 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 acetaminophen (TYLENOL) 325 MG tablet Take 2 tablets (650 mg total) by mouth every 6 (six) hours as needed for mild pain (or Fever >/= 101).    Marland Kitchen amiodarone (PACERONE) 200 MG tablet Take 1 tablet (200 mg  total) by mouth daily. 60 tablet 3  . apixaban (ELIQUIS) 2.5 MG TABS tablet Take 1 tablet (2.5 mg total) by mouth 2 (two) times daily. 60 tablet 6  . carvedilol (COREG) 6.25 MG tablet TAKE ONE TABLET BY MOUTH TWICE DAILY WITH MEALS 60 tablet 6  . digoxin (LANOXIN) 0.125 MG tablet TAKE ONE-HALF TABLET BY MOUTH ONCE DAILY 15 tablet 6  . ferrous sulfate ([employer] IRON) 325 (65 FE) MG tablet Take 325 mg by mouth every Monday, Wednesday, and Friday.     . furosemide (LASIX) 20 MG tablet Take 20 mg by mouth daily.     . hydrOXYzine (ATARAX/VISTARIL) 25 MG tablet Take 1 tablet by mouth every 4 (four) hours as needed for itching.     Marland Kitchen KLOR-CON M10 10 MEQ tablet Take 10 mEq by mouth daily.     Marland Kitchen levothyroxine (SYNTHROID, LEVOTHROID) 50  MCG tablet Take 25 mcg by mouth daily before breakfast.     . Multiple Vitamins-Minerals (CENTRUM SILVER PO) Take 0.5 tablets by mouth 2 (two) times daily.    . pantoprazole (PROTONIX) 40 MG tablet Take 40 mg by mouth daily.     . rosuvastatin (CRESTOR) 20 MG tablet Take 1 tablet (20 mg total) by mouth daily. 30 tablet 6  . sodium chloride (OCEAN) 0.65 % SOLN nasal spray Place 1 spray into both nostrils as needed for congestion.    . tamsulosin (FLOMAX) 0.4 MG CAPS capsule Take 1 capsule by mouth at bedtime.    . valsartan (DIOVAN) 80 MG tablet Take 0.5 tablets (40 mg total) by mouth daily. Only if SBP > 100 30 tablet 6  . ZETIA 10 MG tablet TAKE ONE TABLET BY MOUTH EVERY DAY WITH  SUPPER 30 tablet 12  . Magnesium Hydroxide (MILK OF MAGNESIA PO) Take 20 mLs by mouth daily as needed (for constipation).    . nitroGLYCERIN (NITROSTAT) 0.4 MG SL tablet Place 1 tablet (0.4 mg total) under the tongue every 5 (five) minutes as needed. For chest pain (Patient not taking: Reported on 11/03/2014) 25 tablet 2   No current facility-administered medications for this encounter.     PHYSICAL EXAM: Filed Vitals:   11/03/14 1348  BP: 117/61  Pulse: 82  Resp: 18    General:  Elderly.   no resp difficulty (wife present)  HEENT: normal Neck: supple. JVP 6 Carotids 2+ bilat; no bruits. Cor: PMI laterally displaced. Regular rate & rhythm. No rubs, gallops, 2/6 SEM murmur at apex. ICD site ok Lungs: clear with decreased air movement (mild) Abdomen: soft, nontender, distended  No hepatosplenomegaly. No bruits or masses. Good bowel sounds. Extremities: no cyanosis, clubbing, rash, edema. +varicose veins.  Neuro: alert & orientedx3, cranial nerves grossly intact. moves all 4 extremities w/o difficulty. affect pleasant +diffuse tremor   ASSESSMENT & PLAN: 1. Chronic systolic CHF    --Stable with NYHA III symptoms. Volume status looks good.    --Continue current regimen. Have been unable to titrate due to  intolerance. 2. CAD     --no ischemic s/s. Off ASA due to Eliquis. Continue statin.  3. PAF       --Decrease amio to 100 daily due to tremor. Tolerating Eliquis well. Cautioned to be more careful to prevent falls. TSH was low in 8/15. Synthroid cut back by Dr. Virgina Jock. 4. Carotid ultrasound - asx. Scan 1-39% bilaterally. No need for repeat.   5. CKD - stage 4 - followed at [facility] by Dr. Joelyn Oms 6. Tremor - may be  due to [facility]. Will decrease to 100 daily. If no improvement will refer to Neurology.    Benay Spice 2:31 PM

## 2014-11-03 NOTE — Progress Notes (Signed)
Advanced Heart Failure Medication Review by a Pharmacist  Does the patient  feel that his/her medications are working for him/her?  yes  Has the patient been experiencing any side effects to the medications prescribed?  Unsure - increased tremor and unsteadiness  Does the patient measure his/her own blood pressure or blood glucose at home?  yes   Does the patient have any problems obtaining medications due to transportation or finances?   no  Understanding of regimen: good Understanding of indications: good Potential of compliance: good    Pharmacist comments:  Pleasant 81 yo M presenting with his wife. He states that he forgot his medication list and bottles but he and his wife were able to verbalize good understanding of his regimen. He states he rarely misses any doses of his medications and uses a pillbox as a reminder. He does complain of increased resting hand tremor and unsteady gait. This may be due to his chronic use of amiodarone. He is on a fairly low dose of amiodarone but I did explain that there is still a small risk of tremor in these patients. Will discuss options with HF physician.   [name]. Velva Harman, PharmD Clinical Pharmacist - Resident Pager: [phone] Pharmacy: [phone] 11/03/2014 2:29 PM

## 2014-11-11 ENCOUNTER — Telehealth (HOSPITAL_COMMUNITY): Payer: Self-pay | Admitting: Vascular Surgery

## 2014-11-11 NOTE — Telephone Encounter (Signed)
Spoke w/pt's wife, she seems to be very confused about Carvedilol she feels that he is out of medication to early, she verified bottle does states 6.25 mg and he is taking 1 tablet Twice daily, she feels the pharmacy made a mistake and only gave him 30 tabs, advised our records indicate a prescription was last sent in in Sep with # 58 and 6 refills, if pharmacy gave her wrong quanity she will need to discuss with them, she will give them a call and call back if she needs assistance from [place]

## 2014-11-11 NOTE — Telephone Encounter (Signed)
Pt wife called pt will be out of  Carvedilol and Furosemide in a few days but pt cant get a refill unitl March .Marland Kitchen Pt wife wants to know what they need to do.. Please advise

## 2014-11-22 ENCOUNTER — Other Ambulatory Visit (HOSPITAL_COMMUNITY): Payer: Self-pay | Admitting: Cardiology

## 2014-11-22 MED ORDER — DIGOXIN 125 MCG PO TABS: 62.5000 ug | ORAL_TABLET | Freq: Every day | ORAL | Status: DC

## 2014-11-24 DIAGNOSIS — I480 Paroxysmal atrial fibrillation: Secondary | ICD-10-CM | POA: Diagnosis not present

## 2014-11-24 DIAGNOSIS — G629 Polyneuropathy, unspecified: Secondary | ICD-10-CM | POA: Diagnosis not present

## 2014-11-24 DIAGNOSIS — N184 Chronic kidney disease, stage 4 (severe): Secondary | ICD-10-CM | POA: Diagnosis not present

## 2014-11-24 DIAGNOSIS — R251 Tremor, unspecified: Secondary | ICD-10-CM | POA: Diagnosis not present

## 2014-11-24 DIAGNOSIS — Z950 Presence of cardiac pacemaker: Secondary | ICD-10-CM | POA: Diagnosis not present

## 2014-11-24 DIAGNOSIS — Z6828 Body mass index (BMI) 28.0-28.9, adult: Secondary | ICD-10-CM | POA: Diagnosis not present

## 2014-11-24 DIAGNOSIS — R627 Adult failure to thrive: Secondary | ICD-10-CM | POA: Diagnosis not present

## 2014-11-24 DIAGNOSIS — D696 Thrombocytopenia, unspecified: Secondary | ICD-10-CM | POA: Diagnosis not present

## 2014-11-24 DIAGNOSIS — D649 Anemia, unspecified: Secondary | ICD-10-CM | POA: Diagnosis not present

## 2014-11-24 DIAGNOSIS — I6529 Occlusion and stenosis of unspecified carotid artery: Secondary | ICD-10-CM | POA: Diagnosis not present

## 2014-11-24 DIAGNOSIS — Z1389 Encounter for screening for other disorder: Secondary | ICD-10-CM | POA: Diagnosis not present

## 2014-11-24 DIAGNOSIS — I10 Essential (primary) hypertension: Secondary | ICD-10-CM | POA: Diagnosis not present

## 2014-11-24 DIAGNOSIS — I255 Ischemic cardiomyopathy: Secondary | ICD-10-CM | POA: Diagnosis not present

## 2014-11-30 ENCOUNTER — Encounter: Payer: Self-pay | Admitting: Neurology

## 2014-11-30 ENCOUNTER — Ambulatory Visit (INDEPENDENT_AMBULATORY_CARE_PROVIDER_SITE_OTHER): Payer: Medicare Other | Admitting: Neurology

## 2014-11-30 VITALS — BP 120/62 | HR 76 | Ht 70.0 in | Wt 190.0 lb

## 2014-11-30 DIAGNOSIS — G251 Drug-induced tremor: Secondary | ICD-10-CM | POA: Diagnosis not present

## 2014-11-30 DIAGNOSIS — I255 Ischemic cardiomyopathy: Secondary | ICD-10-CM | POA: Diagnosis not present

## 2014-11-30 DIAGNOSIS — G609 Hereditary and idiopathic neuropathy, unspecified: Secondary | ICD-10-CM

## 2014-11-30 DIAGNOSIS — G250 Essential tremor: Secondary | ICD-10-CM | POA: Diagnosis not present

## 2014-11-30 NOTE — Progress Notes (Signed)
[name] was seen today in the movement disorders clinic for neurologic consultation at the request of [name], MD.  The consultation is for the evaluation of tremor and gait change.  Pt states that balance is good but that tremor is main complaint. Wife admits that he has had a few falls.  He does have paresthesias in the legs that "come and go," especially at night.    He has had tremor "all his life."  His wife remembers that he would complain of internal tremor (they are married 62 years) for as long as she can remember.  He saw [facility] in 1994 and they diagnosed him with ET.  He remembers that in the early 1990's he was given ? Xanax and that "knocked it out."  However, his primary doctor did not want to continue that medication and the patient ultimately went to [facility].  He was tried on "3-4 different medications."  He doesn't remember any of the medications.  He was tried on primidone (? Dose) and it didn't help and they don't think that it had SE.  Tremor has picked up especially over the last few weeks.  His cardiologist did try to drop the amiodarone dose and that did not help (about a month ago).  He has been on amiodarone about 10 years.  He notices tremor the most when using the hands.  The L hand is the worst, although he is R hand dominant.  His mother had tremor.    Specific Symptoms:  Tremor: Yes.     Affected by caffeine:  No.  Affected by alcohol:  Unknown; doesn't drink  Affected by stress:  No.  Affected by fatigue:  Yes.    Spills soup if on spoon:  Yes.    Spills glass of liquid if full:  Yes.    Affects ADL's (tying shoes, brushing teeth, etc):  No.   ALLERGIES:   Allergies  Allergen Reactions  . Ramipril Other (See Comments)    cough  . Amoxicillin Rash  . Penicillins Rash    CURRENT MEDICATIONS:  Outpatient Encounter Prescriptions as of 11/30/2014  Medication Sig  . acetaminophen (TYLENOL) 325 MG tablet Take 2 tablets (650 mg total) by mouth every 6  (six) hours as needed for mild pain (or Fever >/= 101).  Marland Kitchen amiodarone (PACERONE) 100 MG tablet Take 1 tablet (100 mg total) by mouth daily.  Marland Kitchen apixaban (ELIQUIS) 2.5 MG TABS tablet Take 1 tablet (2.5 mg total) by mouth 2 (two) times daily.  . carvedilol (COREG) 6.25 MG tablet TAKE ONE TABLET BY MOUTH TWICE DAILY WITH MEALS  . digoxin (LANOXIN) 0.125 MG tablet Take 0.5 tablets (62.5 mcg total) by mouth daily.  . ferrous sulfate (CVS IRON) 325 (65 FE) MG tablet Take 325 mg by mouth every Monday, Wednesday, and Friday.   . furosemide (LASIX) 20 MG tablet Take 20 mg by mouth daily. Every Monday and Friday  . hydrOXYzine (ATARAX/VISTARIL) 25 MG tablet Take 1 tablet by mouth every 4 (four) hours as needed for itching.   Marland Kitchen KLOR-CON M10 10 MEQ tablet Take 10 mEq by mouth daily.   Marland Kitchen levothyroxine (SYNTHROID, LEVOTHROID) 50 MCG tablet Take 25 mcg by mouth daily before breakfast.   . Magnesium Hydroxide (MILK OF MAGNESIA PO) Take 20 mLs by mouth daily as needed (for constipation).  . Multiple Vitamins-Minerals (CENTRUM SILVER PO) Take 0.5 tablets by mouth 2 (two) times daily.  . pantoprazole (PROTONIX) 40 MG tablet Take 40  mg by mouth daily.   . Pumpkin Seed-Soy Germ (AZO BLADDER CONTROL/GO-LESS) CAPS Take by mouth as needed.  . rosuvastatin (CRESTOR) 20 MG tablet Take 1 tablet (20 mg total) by mouth daily.  . sodium chloride (OCEAN) 0.65 % SOLN nasal spray Place 1 spray into both nostrils as needed for congestion.  . tamsulosin (FLOMAX) 0.4 MG CAPS capsule Take 1 capsule by mouth at bedtime.  . valsartan (DIOVAN) 80 MG tablet Take 0.5 tablets (40 mg total) by mouth daily. Only if SBP > 100  . ZETIA 10 MG tablet TAKE ONE TABLET BY MOUTH EVERY DAY WITH  SUPPER  . nitroGLYCERIN (NITROSTAT) 0.4 MG SL tablet Place 1 tablet (0.4 mg total) under the tongue every 5 (five) minutes as needed. For chest pain (Patient not taking: Reported on 11/03/2014)    PAST MEDICAL HISTORY:   Past Medical History  Diagnosis  Date  . CAD (coronary artery disease)      a. s/p anterior MI 12/05 c/b shock -> stent LAD   b. s/p stenting OM-1, 2/06  . CHF (congestive heart failure)     due to ischemic CM  a. EF 20-30%. (Nov 2008)   b. s/p St. Jude BiV-ICD    c. CPX 07/2008  pvo2 16.3 (63% predicted) slope 34 RER 1.08 O2 pulse 93%  . Hyperlipidemia   . Hypothyroidism   . GERD (gastroesophageal reflux disease)     hx Barretts esophagitis  . Anemia   . 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 History of blood transfusion     following coumadin  usage  . Cough     occasional /productive, no fever/ not new  . Skin cancer     basal cells   facial x 4, 1 right forearm  . Bladder cancer dx'd 05/2012  . HTN (hypertension)     EKG 05/14/12,Chest x ray 8/13, last ICD interrogation 8/13 EPIC  . Atrial fibrillation or flutter     maintaining sinus on amiodarone    . Sleep apnea     STOP BANG SCORE 4  . CRI (chronic renal insufficiency)     (baseline 2.0-2.2)/ recent hospitalization 8/13  . 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 Cervical laminectomy  1985  . Back surgery  1972    lower back  . Cardiac defibrillator placement      ICD St Jude; gen change 09-20-13  . Hernia repair  1989    bilateral  . Esophagogastroduodenoscopy    . Colonoscopy    . Coronary angioplasty  [id number]  . Transurethral resection of bladder tumor  05/25/2012    cold cup biopsy prostate  . Cystoscopy w/ retrogrades  05/25/2012    Procedure: CYSTOSCOPY WITH RETROGRADE PYELOGRAM;  Surgeon: Molli Hazard, MD;  Location: [facility];  Service: Urology;  Laterality: Bilateral;  . Transurethral resection of bladder tumor  07/08/2012    Procedure: TRANSURETHRAL RESECTION OF BLADDER TUMOR (TURBT);  Surgeon: Molli Hazard, MD;  Location: [facility];  Service: Urology;  Laterality: N/A;   CYSTO, TURBT W/ GYRUS, BILATERAL STENT REMOVAL, LEFT URETEROSCOPY WITH BIOPSY, POSSIBLE LEFT STENT PLACEMENT   . Cystoscopy w/ ureteral stent removal  07/08/2012    Procedure: CYSTOSCOPY WITH STENT REMOVAL;  Surgeon: Molli Hazard, MD;  Location: [facility];  Service: Urology;  Laterality:  Bilateral;  . Cystoscopy w/ ureteral stent placement  07/08/2012    Procedure: CYSTOSCOPY WITH STENT REPLACEMENT;  Surgeon: Molli Hazard, MD;  Location: [facility];  Service: Urology;  Laterality: Left;  . Cystoscopy/retrograde/ureteroscopy  07/08/2012    Procedure: CYSTOSCOPY/RETROGRADE/URETEROSCOPY;  Surgeon: Molli Hazard, MD;  Location: [facility];  Service: Urology;  Laterality: Left;  . Implantable cardioverter defibrillator (icd) generator change N/A 09/20/2013    Procedure: ICD GENERATOR CHANGE;  Surgeon: Deboraha Sprang, MD;  Location: [facility] CATH LAB;  Service: Cardiovascular;  Laterality: N/A;    SOCIAL HISTORY:   History   Social History  . Marital Status: Married    Spouse Name: N/A  . Number of Children: N/A  . Years of Education: N/A   Occupational History  . retired    Social History Main Topics  . Smoking status: Former Smoker    Types: Cigarettes, Cigars    Quit date: 03/02/2004  . Smokeless tobacco: Former Systems developer    Quit date: 05/20/1999     Comment: quit in 2005  . Alcohol Use: No  . Drug Use: No  . Sexual Activity: Not on file   Other Topics Concern  . Not on file   Social History Narrative    FAMILY HISTORY:   Family Status  Relation Status Death Age  . Mother Deceased 69    familial tremor, stroke  . Father Deceased     cerebral hemorrhage  . Brother Deceased     emphysema   . Son Alive     DM, MI    ROS:  A complete 10 system review of systems was obtained and was unremarkable apart from what is mentioned above.  PHYSICAL EXAMINATION:    VITALS:   Filed Vitals:   11/30/14 0920  BP: 120/62  Pulse: 76  Height: 5\' 10"  (1.778 m)  Weight: 190 lb  (86.183 kg)    GEN:  The patient appears stated age and is in NAD. HEENT:  Normocephalic, atraumatic.  The mucous membranes are moist. The superficial temporal arteries are without ropiness or tenderness. CV:  RRR Lungs:  CTAB Neck/HEME:  There are no carotid bruits bilaterally.  Neurological examination:  Orientation: The patient is alert and oriented x3. Fund of knowledge is appropriate.  Recent and remote memory are intact.  Attention and concentration are normal.    Able to name objects and repeat phrases. Cranial nerves: There is good facial symmetry. Pupils are equal round and reactive to light bilaterally. Fundoscopic exam reveals clear margins bilaterally. Extraocular muscles are intact. The visual fields are full to confrontational testing. The speech is fluent and clear. Soft palate rises symmetrically and there is no tongue deviation. Hearing is intact to conversational tone. Sensation: Sensation is intact to light and pinprick throughout (facial, trunk, extremities). Vibration is markedly decreased in a distal fashion.  Proprioception is intact at the bilateral big toe.  There is no extinction with double simultaneous stimulation. There is no sensory dermatomal level identified. Motor: Strength is 5/5 in the bilateral upper and lower extremities.   Shoulder shrug is equal and symmetric.  There is no pronator drift. Deep tendon reflexes: Deep tendon reflexes are 2/4 at the bilateral biceps, triceps, brachioradialis, 1/4 at the bilateral patella and absent at the bilateral achilles. Plantar responses are downgoing bilaterally.  Movement examination: Tone: There is normal tone in the bilateral upper extremities.  The tone in the lower extremities is normal.  Abnormal movements: There is tremor that can be felt  at rest, left greater than right.  There is tremor of the outstretched hands that increases with intention, especially on the left.  He has difficulty pouring water from one glass  to another and spills the water. Coordination:  There is no significant decremation with RAM's, seen with any form of rapid alternating movement in the arms or legs. Gait and Station: The patient has no difficulty arising out of a deep-seated chair without the use of the hands. The patient's stride length is normal, but he is wide-based.    ASSESSMENT/PLAN:  1.  Tremor  -I think that this is multifactorial.  I do think that he has a long history of essential tremor, that may be worsened by amiodarone.  I explained to him that 33% of patients who are exposed amiodarone will have some type of tremor.  However, even if the amiodarone was gone, I have no doubt that he would have tremor.  We talked about various tremor medications, and even talked about retrying primidone and working up to higher dosages, but he just did not think that he wanted to add more medication now, especially when I told him that I was fairly confident that this would not alleviate his tremor altogether.  -I did talk to him about DBS, but I also told him that I really did not think he was a candidate given his multiple other medical problems.  I talked to him about focused ultrasound, although [facility] is not considering further candidates until September.  -I reassured the patient that I saw no evidence of a degenerative process such as Parkinson's disease.  It turns out, that was really the only thing that the patient wanted today. 2.  Gait instability.  -The patient had clear evidence of peripheral neuropathy on his examination.  I think that his gait instability and also the paresthesias are a result of this.  The peripheral neuropathy could also be from amiodarone.  However, if not already done so I would check labs including B12, folate, RPR, SPEP/UPEP with immunofixation, fasting glucose.  He prefers to have these done through his family physician since he will not be following up here.  We discussed safety associated with peripheral  neuropathy.  Much greater than 50% of this 60 minute visit was spent in counseling.  We talked about the importance of hard soled shoes.  We talked about nightlights and getting rid of throw rugs. 3.  Follow up prn

## 2014-12-02 DIAGNOSIS — I10 Essential (primary) hypertension: Secondary | ICD-10-CM | POA: Diagnosis not present

## 2014-12-02 DIAGNOSIS — R05 Cough: Secondary | ICD-10-CM | POA: Diagnosis not present

## 2014-12-02 DIAGNOSIS — J069 Acute upper respiratory infection, unspecified: Secondary | ICD-10-CM | POA: Diagnosis not present

## 2014-12-02 DIAGNOSIS — I480 Paroxysmal atrial fibrillation: Secondary | ICD-10-CM | POA: Diagnosis not present

## 2014-12-02 DIAGNOSIS — Z6828 Body mass index (BMI) 28.0-28.9, adult: Secondary | ICD-10-CM | POA: Diagnosis not present

## 2014-12-02 DIAGNOSIS — N184 Chronic kidney disease, stage 4 (severe): Secondary | ICD-10-CM | POA: Diagnosis not present

## 2014-12-19 DIAGNOSIS — H2511 Age-related nuclear cataract, right eye: Secondary | ICD-10-CM | POA: Diagnosis not present

## 2014-12-19 DIAGNOSIS — H40013 Open angle with borderline findings, low risk, bilateral: Secondary | ICD-10-CM | POA: Diagnosis not present

## 2014-12-19 DIAGNOSIS — Z961 Presence of intraocular lens: Secondary | ICD-10-CM | POA: Diagnosis not present

## 2014-12-20 DIAGNOSIS — N289 Disorder of kidney and ureter, unspecified: Secondary | ICD-10-CM | POA: Diagnosis not present

## 2014-12-20 DIAGNOSIS — R3915 Urgency of urination: Secondary | ICD-10-CM | POA: Diagnosis not present

## 2014-12-20 DIAGNOSIS — C670 Malignant neoplasm of trigone of bladder: Secondary | ICD-10-CM | POA: Diagnosis not present

## 2014-12-20 DIAGNOSIS — N401 Enlarged prostate with lower urinary tract symptoms: Secondary | ICD-10-CM | POA: Diagnosis not present

## 2014-12-21 ENCOUNTER — Telehealth: Payer: Self-pay | Admitting: Neurology

## 2014-12-21 NOTE — Telephone Encounter (Signed)
Note faxed to Dr Virgina Jock at [phone] with confirmation received per their request.

## 2014-12-22 DIAGNOSIS — E039 Hypothyroidism, unspecified: Secondary | ICD-10-CM | POA: Diagnosis not present

## 2014-12-22 DIAGNOSIS — I10 Essential (primary) hypertension: Secondary | ICD-10-CM | POA: Diagnosis not present

## 2014-12-29 ENCOUNTER — Encounter: Payer: Self-pay | Admitting: Cardiology

## 2015-01-05 ENCOUNTER — Other Ambulatory Visit (HOSPITAL_COMMUNITY): Payer: Self-pay

## 2015-01-05 MED ORDER — EZETIMIBE 10 MG PO TABS: ORAL_TABLET | ORAL | Status: DC

## 2015-01-09 ENCOUNTER — Other Ambulatory Visit (HOSPITAL_COMMUNITY): Payer: Self-pay

## 2015-01-09 MED ORDER — APIXABAN 2.5 MG PO TABS: 2.5000 mg | ORAL_TABLET | Freq: Two times a day (BID) | ORAL | Status: DC

## 2015-01-11 ENCOUNTER — Encounter: Payer: Self-pay | Admitting: Internal Medicine

## 2015-01-13 ENCOUNTER — Other Ambulatory Visit (HOSPITAL_COMMUNITY): Payer: Self-pay | Admitting: *Deleted

## 2015-01-13 MED ORDER — ROSUVASTATIN CALCIUM 20 MG PO TABS: 20.0000 mg | ORAL_TABLET | Freq: Every day | ORAL | Status: DC

## 2015-01-23 ENCOUNTER — Ambulatory Visit (INDEPENDENT_AMBULATORY_CARE_PROVIDER_SITE_OTHER): Payer: Medicare Other | Admitting: *Deleted

## 2015-01-23 ENCOUNTER — Encounter: Payer: Self-pay | Admitting: Internal Medicine

## 2015-01-23 DIAGNOSIS — I255 Ischemic cardiomyopathy: Secondary | ICD-10-CM | POA: Diagnosis not present

## 2015-01-23 DIAGNOSIS — N184 Chronic kidney disease, stage 4 (severe): Secondary | ICD-10-CM | POA: Diagnosis not present

## 2015-01-23 NOTE — Progress Notes (Signed)
Remote ICD transmission.   

## 2015-01-24 LAB — MDC_IDC_ENUM_SESS_TYPE_REMOTE
Brady Statistic RV Percent Paced: 99 %
Lead Channel Impedance Value: 360 Ohm
Lead Channel Setting Pacing Amplitude: 2 V
Lead Channel Setting Pacing Amplitude: 2 V
Lead Channel Setting Pacing Amplitude: 2.5 V
Lead Channel Setting Pacing Pulse Width: 0.8 ms
Lead Channel Setting Pacing Pulse Width: 0.8 ms
Lead Channel Setting Sensing Sensitivity: 0.5 mV
MDC IDC MSMT LEADCHNL LV IMPEDANCE VALUE: 440 Ohm
MDC IDC MSMT LEADCHNL RA IMPEDANCE VALUE: 390 Ohm
MDC IDC MSMT LEADCHNL RA SENSING INTR AMPL: 4.6 mV
MDC IDC PG SERIAL: 7138995
MDC IDC STAT BRADY RA PERCENT PACED: 97 %
Zone Setting Detection Interval: 250 ms
Zone Setting Detection Interval: 300 ms

## 2015-01-25 DIAGNOSIS — I509 Heart failure, unspecified: Secondary | ICD-10-CM | POA: Diagnosis not present

## 2015-01-25 DIAGNOSIS — I10 Essential (primary) hypertension: Secondary | ICD-10-CM | POA: Diagnosis not present

## 2015-01-25 DIAGNOSIS — N184 Chronic kidney disease, stage 4 (severe): Secondary | ICD-10-CM | POA: Diagnosis not present

## 2015-01-26 ENCOUNTER — Telehealth (HOSPITAL_COMMUNITY): Payer: Self-pay | Admitting: *Deleted

## 2015-01-26 NOTE — Telephone Encounter (Signed)
Pt kept appt October 2015 last appt was 01/25/15

## 2015-02-14 ENCOUNTER — Other Ambulatory Visit (HOSPITAL_COMMUNITY): Payer: Self-pay | Admitting: *Deleted

## 2015-02-14 MED ORDER — CARVEDILOL 6.25 MG PO TABS: 6.2500 mg | ORAL_TABLET | Freq: Two times a day (BID) | ORAL | Status: DC

## 2015-02-21 ENCOUNTER — Encounter: Payer: Self-pay | Admitting: Cardiology

## 2015-03-16 DIAGNOSIS — H6522 Chronic serous otitis media, left ear: Secondary | ICD-10-CM | POA: Diagnosis not present

## 2015-03-23 DIAGNOSIS — I10 Essential (primary) hypertension: Secondary | ICD-10-CM | POA: Diagnosis not present

## 2015-03-23 DIAGNOSIS — R251 Tremor, unspecified: Secondary | ICD-10-CM | POA: Diagnosis not present

## 2015-03-23 DIAGNOSIS — I2510 Atherosclerotic heart disease of native coronary artery without angina pectoris: Secondary | ICD-10-CM | POA: Diagnosis not present

## 2015-03-23 DIAGNOSIS — R5383 Other fatigue: Secondary | ICD-10-CM | POA: Diagnosis not present

## 2015-03-23 DIAGNOSIS — E785 Hyperlipidemia, unspecified: Secondary | ICD-10-CM | POA: Diagnosis not present

## 2015-03-23 DIAGNOSIS — D649 Anemia, unspecified: Secondary | ICD-10-CM | POA: Diagnosis not present

## 2015-03-23 DIAGNOSIS — Z950 Presence of cardiac pacemaker: Secondary | ICD-10-CM | POA: Diagnosis not present

## 2015-03-23 DIAGNOSIS — Z008 Encounter for other general examination: Secondary | ICD-10-CM | POA: Diagnosis not present

## 2015-03-23 DIAGNOSIS — I6529 Occlusion and stenosis of unspecified carotid artery: Secondary | ICD-10-CM | POA: Diagnosis not present

## 2015-03-23 DIAGNOSIS — I255 Ischemic cardiomyopathy: Secondary | ICD-10-CM | POA: Diagnosis not present

## 2015-03-23 DIAGNOSIS — E039 Hypothyroidism, unspecified: Secondary | ICD-10-CM | POA: Diagnosis not present

## 2015-03-23 DIAGNOSIS — N184 Chronic kidney disease, stage 4 (severe): Secondary | ICD-10-CM | POA: Diagnosis not present

## 2015-03-30 ENCOUNTER — Ambulatory Visit (HOSPITAL_COMMUNITY)
Admission: RE | Admit: 2015-03-30 | Discharge: 2015-03-30 | Disposition: A | Discharge status: Home / Self Care | Payer: Medicare Other | Source: Ambulatory Visit | Attending: Internal Medicine | Admitting: Internal Medicine

## 2015-03-30 ENCOUNTER — Encounter (HOSPITAL_COMMUNITY): Payer: Self-pay

## 2015-03-30 VITALS — BP 100/58 | HR 64 | Wt 189.2 lb

## 2015-03-30 DIAGNOSIS — N184 Chronic kidney disease, stage 4 (severe): Secondary | ICD-10-CM

## 2015-03-30 DIAGNOSIS — I480 Paroxysmal atrial fibrillation: Secondary | ICD-10-CM

## 2015-03-30 DIAGNOSIS — I5022 Chronic systolic (congestive) heart failure: Secondary | ICD-10-CM | POA: Diagnosis not present

## 2015-03-30 NOTE — Patient Instructions (Signed)
Your physician has requested that you have an echocardiogram. Echocardiography is a painless test that uses sound waves to create images of your heart. It provides your doctor with information about the size and shape of your heart and how well your heart's chambers and valves are working. This procedure takes approximately one hour. There are no restrictions for this procedure.  IN 6 MONTHS  Your physician recommends that you schedule a follow-up appointment in: 6 months

## 2015-03-30 NOTE — Progress Notes (Signed)
Patient ID: [name], male   DOB: April 04, 1932, 81 y.o.   MRN: [id number] Urologist: Dr [name] HPI: [name] is delightful 81 year old male with a history of coronary artery disease, status post previous large anterior wall myocardial infarction s/p multivessel stenting, systolic CHF with EF 123456 s/p St. Jude BiV ICD.  Remainder of his medical history is notable for atrial fibrillation,maintaining sinus rhythm on amiodarone.  He is not on Coumadin secondary to GI bleed, chronic renal insufficiency with baseline creatinine about 2.2-2.5, hypertension, hyperlipidemia, and a systemic tremor.Bladder cancer 05/2012. Completed BCG treatment.   Last year found that his left ventricular lead has not been capturing suggestive of either fracture of the proximal electrode or an issue at the header. They reprogrammed the device to use to the RV coil which he tolerated well and had a good threshold.    He returns for follow up. Overall he feels pretty good. Denies SOB/PND/Orthopnea. Completing lots yard work.  Taking all medications.   ECHO 12/14 LVEF 20-25% Carotid u/s 3/14: 40-59% R 0-39%%L  Labs  12/20/13: K 4.1 Cr 2.7 Hgb 11.9  06/07/14: K 4.5 Cr 2.0  ROS: All systems negative except as listed in HPI, PMH and Problem List.  Past Medical History  Diagnosis Date  . CAD (coronary artery disease)      a. s/p anterior MI 12/05 c/b shock -> stent LAD   b. s/p stenting OM-1, 2/06  . CHF (congestive heart failure)     due to ischemic CM  a. EF 20-30%. (Nov 2008)   b. s/p St. Jude BiV-ICD    c. CPX 07/2008  pvo2 16.3 (63% predicted) slope 34 RER 1.08 O2 pulse 93%  . Hyperlipidemia   . Hypothyroidism   . GERD (gastroesophageal reflux disease)     hx Barretts esophagitis  . Anemia   . 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 History of blood transfusion     following coumadin  usage  . Cough     occasional /productive, no fever/ not new  . Skin cancer      basal cells   facial x 4, 1 right forearm  . Bladder cancer dx'd 05/2012  . HTN (hypertension)     EKG 05/14/12,Chest x ray 8/13, last ICD interrogation 8/13 EPIC  . Atrial fibrillation or flutter     maintaining sinus on amiodarone    . Sleep apnea     STOP BANG SCORE 4  . CRI (chronic renal insufficiency)     (baseline 2.0-2.2)/ recent hospitalization 8/13  . 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 acetaminophen (TYLENOL) 325 MG tablet Take 2 tablets (650 mg total) by mouth every 6 (six) hours as needed for mild pain (or Fever >/= 101).    Marland Kitchen amiodarone (PACERONE) 100 MG tablet Take 1 tablet (100 mg total) by mouth daily.    Marland Kitchen apixaban (ELIQUIS) 2.5 MG TABS tablet Take 1 tablet (2.5 mg total) by mouth 2 (two) times daily. 60 tablet 6  . carvedilol (COREG) 6.25 MG tablet Take 1 tablet (6.25 mg total) by mouth 2 (two) times daily with a meal. 60 tablet 6  . digoxin (LANOXIN) 0.125 MG tablet Take 0.5 tablets (62.5 mcg total) by mouth daily. 15 tablet 6  . ezetimibe (ZETIA) 10 MG tablet TAKE ONE TABLET BY MOUTH EVERY DAY WITH  SUPPER  90 tablet 3  . ferrous sulfate (CVS IRON) 325 (65 FE) MG tablet Take 325 mg by mouth every Monday, Wednesday, and Friday.     . furosemide (LASIX) 20 MG tablet Take 20 mg by mouth daily. Every Monday and Friday    . hydrOXYzine (ATARAX/VISTARIL) 25 MG tablet Take 1 tablet by mouth every 4 (four) hours as needed for itching.     Marland Kitchen KLOR-CON M10 10 MEQ tablet Take 10 mEq by mouth daily.     Marland Kitchen levothyroxine (SYNTHROID, LEVOTHROID) 50 MCG tablet Take 25 mcg by mouth daily before breakfast.     . Magnesium Hydroxide (MILK OF MAGNESIA PO) Take 20 mLs by mouth daily as needed (for constipation).    . Multiple Vitamins-Minerals (CENTRUM SILVER PO) Take 0.5 tablets by mouth 2 (two) times daily.    . nitroGLYCERIN (NITROSTAT) 0.4 MG SL tablet Place 1 tablet (0.4 mg total)  under the tongue every 5 (five) minutes as needed. For chest pain 25 tablet 2  . pantoprazole (PROTONIX) 40 MG tablet Take 40 mg by mouth daily.     . Pumpkin Seed-Soy Germ (AZO BLADDER CONTROL/GO-LESS) CAPS Take by mouth as needed.    . rosuvastatin (CRESTOR) 20 MG tablet Take 1 tablet (20 mg total) by mouth daily. 30 tablet 6  . sodium chloride (OCEAN) 0.65 % SOLN nasal spray Place 1 spray into both nostrils as needed for congestion.    . tamsulosin (FLOMAX) 0.4 MG CAPS capsule Take 1 capsule by mouth at bedtime.    . valsartan (DIOVAN) 80 MG tablet Take 0.5 tablets (40 mg total) by mouth daily. Only if SBP > 100 30 tablet 6   No current facility-administered medications for this encounter.     PHYSICAL EXAM: Filed Vitals:   03/30/15 1418  BP: 100/58  Pulse: 64    General:  Elderly. No resp difficulty (wife present)  HEENT: normal Neck: supple. JVP flat.  Carotids 2+ bilat; no bruits. Cor: PMI laterally displaced. Regular rate & rhythm. No rubs, gallops, 2/6 SEM murmur at apex. ICD site ok Lungs: clear  Abdomen: soft, nontender, distended  No hepatosplenomegaly. No bruits or masses. Good bowel sounds. Extremities: no cyanosis, clubbing, rash, edema. +varicose veins.  Neuro: alert & orientedx3, cranial nerves grossly intact. moves all 4 extremities w/o difficulty. affect pleasant +diffuse tremor   ASSESSMENT & PLAN: 1. Chronic systolic CHF. ECHO EF 20-25%    --Stable with NYHA II-III symptoms. Volume status stable.     --Continue current regimen. Have been unable to titrate due to intolerance. 2. CAD     --no ischemic s/s. Off ASA due to Eliquis. Continue statin.  3. PAF       --Continue amio to 100 daily. Tolerating Eliquis well.   4. Carotid ultrasound - asx. Scan 1-39% bilaterally. No need for repeat.   5. CKD - stage 4 - followed at [facility] by Dr. Joelyn Oms 6. Tremor - Followed by Neurology.   Follow up in 6 months with an ECHO.  CLEGG,AMY, NP-C  2:29 PM

## 2015-04-04 DIAGNOSIS — N184 Chronic kidney disease, stage 4 (severe): Secondary | ICD-10-CM | POA: Insufficient documentation

## 2015-04-07 ENCOUNTER — Ambulatory Visit (INDEPENDENT_AMBULATORY_CARE_PROVIDER_SITE_OTHER): Payer: Medicare Other | Admitting: Internal Medicine

## 2015-04-07 ENCOUNTER — Encounter: Payer: Self-pay | Admitting: Internal Medicine

## 2015-04-07 VITALS — BP 114/68 | HR 61 | Ht 70.0 in | Wt 187.7 lb

## 2015-04-07 DIAGNOSIS — I480 Paroxysmal atrial fibrillation: Secondary | ICD-10-CM | POA: Diagnosis not present

## 2015-04-07 DIAGNOSIS — Z79899 Other long term (current) drug therapy: Secondary | ICD-10-CM

## 2015-04-07 DIAGNOSIS — I255 Ischemic cardiomyopathy: Secondary | ICD-10-CM

## 2015-04-07 DIAGNOSIS — I5022 Chronic systolic (congestive) heart failure: Secondary | ICD-10-CM

## 2015-04-07 DIAGNOSIS — Z4502 Encounter for adjustment and management of automatic implantable cardiac defibrillator: Secondary | ICD-10-CM | POA: Diagnosis not present

## 2015-04-07 NOTE — Progress Notes (Signed)
Received lab work from Dr Virgina Jock.  Dr. Caryl Comes reviewed results.  Results sent to medical records to be scanned into chart.

## 2015-04-07 NOTE — Progress Notes (Signed)
Patient states he had lab work at Dr. Keane Police office a couple of weeks ago.  Have left a message for medical records to call me with information of labs.  Patient is aware he may have to come back if specific labs were not drawn.

## 2015-04-07 NOTE — Progress Notes (Addendum)
Patient Care Team: [name], MD as PCP - General (Internal Medicine)   HPI  [name] is a 81 y.o. male seen in followup for ischemic heart myopathy congestive heart failure and prior CRT-D with generator replaced 12/14   We  reprogrammed his device at the last visit to exclude the proximal ring on the LV lead.  He continues to complain of exercise intolerance. This is manifested mostly is fatigue. He does not recall when he had less fatigue however.  He has had no palpitations. He has had problems with atrial fibrillation. He is managed with amiodarone  his last TSH 9/15 was 0.005.  It has not been checked recently that I'm aware of. Last DIG  level is 0.6  Creatinine 8/15 was 2.04    Past Medical History  Diagnosis Date  . CAD (coronary artery disease)      a. s/p anterior MI 12/05 c/b shock -> stent LAD   b. s/p stenting OM-1, 2/06  . CHF (congestive heart failure)     due to ischemic CM  a. EF 20-30%. (Nov 2008)   b. s/p St. Jude BiV-ICD    c. CPX 07/2008  pvo2 16.3 (63% predicted) slope 34 RER 1.08 O2 pulse 93%  . Hyperlipidemia   . Hypothyroidism   . GERD (gastroesophageal reflux disease)     hx Barretts esophagitis  . Anemia   . 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 History of blood transfusion     following coumadin  usage  . Cough     occasional /productive, no fever/ not new  . Skin cancer     basal cells   facial x 4, 1 right forearm  . Bladder cancer dx'd 05/2012  . HTN (hypertension)     EKG 05/14/12,Chest x ray 8/13, last ICD interrogation 8/13 EPIC  . Atrial fibrillation or flutter     maintaining sinus on amiodarone    . Sleep apnea     STOP BANG SCORE 4  . CRI (chronic renal insufficiency)     (baseline 2.0-2.2)/ recent hospitalization 8/13  . 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 Cervical  laminectomy  1985  . Back surgery  1972    lower back  . Cardiac defibrillator placement      ICD St Jude; gen change 09-20-13  . Hernia repair  1989    bilateral  . Esophagogastroduodenoscopy    . Colonoscopy    . Coronary angioplasty  [id number]  . Transurethral resection of bladder tumor  05/25/2012    cold cup biopsy prostate  . Cystoscopy w/ retrogrades  05/25/2012    Procedure: CYSTOSCOPY WITH RETROGRADE PYELOGRAM;  Surgeon: Molli Hazard, MD;  Location: [facility];  Service: Urology;  Laterality: Bilateral;  . Transurethral resection of bladder tumor  07/08/2012    Procedure: TRANSURETHRAL RESECTION OF BLADDER TUMOR (TURBT);  Surgeon: Molli Hazard, MD;  Location: [facility];  Service: Urology;  Laterality: N/A;  CYSTO, TURBT W/ GYRUS, BILATERAL STENT REMOVAL, LEFT URETEROSCOPY WITH BIOPSY, POSSIBLE LEFT STENT PLACEMENT   . Cystoscopy w/ ureteral stent removal  07/08/2012    Procedure: CYSTOSCOPY WITH STENT REMOVAL;  Surgeon: Molli Hazard, MD;  Location: [facility];  Service: Urology;  Laterality: Bilateral;  . Cystoscopy w/ ureteral stent placement  07/08/2012  Procedure: CYSTOSCOPY WITH STENT REPLACEMENT;  Surgeon: Molli Hazard, MD;  Location: [facility];  Service: Urology;  Laterality: Left;  . Cystoscopy/retrograde/ureteroscopy  07/08/2012    Procedure: CYSTOSCOPY/RETROGRADE/URETEROSCOPY;  Surgeon: Molli Hazard, MD;  Location: [facility];  Service: Urology;  Laterality: Left;  . Implantable cardioverter defibrillator (icd) generator change N/A 09/20/2013    Procedure: ICD GENERATOR CHANGE;  Surgeon: Deboraha Sprang, MD;  Location: [facility] CATH LAB;  Service: Cardiovascular;  Laterality: N/A;    Current Outpatient Prescriptions  Medication Sig Dispense Refill  . acetaminophen (TYLENOL) 325 MG tablet Take 2 tablets (650 mg total) by mouth every 6 (six) hours as needed for mild pain (or Fever >/= 101).    Marland Kitchen amiodarone (PACERONE) 100 MG tablet Take 1 tablet (100 mg  total) by mouth daily.    Marland Kitchen apixaban (ELIQUIS) 2.5 MG TABS tablet Take 1 tablet (2.5 mg total) by mouth 2 (two) times daily. 60 tablet 6  . carvedilol (COREG) 6.25 MG tablet Take 1 tablet (6.25 mg total) by mouth 2 (two) times daily with a meal. 60 tablet 6  . digoxin (LANOXIN) 0.125 MG tablet Take 0.5 tablets (62.5 mcg total) by mouth daily. 15 tablet 6  . ezetimibe (ZETIA) 10 MG tablet TAKE ONE TABLET BY MOUTH EVERY DAY WITH  SUPPER 90 tablet 3  . ferrous sulfate (CVS IRON) 325 (65 FE) MG tablet Take 325 mg by mouth every Monday, Wednesday, and Friday.     . furosemide (LASIX) 20 MG tablet Take 20 mg by mouth daily.     . hydrOXYzine (ATARAX/VISTARIL) 25 MG tablet Take 1 tablet by mouth every 4 (four) hours as needed for itching.     Marland Kitchen KLOR-CON M10 10 MEQ tablet Take 10 mEq by mouth daily.     Marland Kitchen levothyroxine (SYNTHROID, LEVOTHROID) 50 MCG tablet Take 25 mcg by mouth daily before breakfast.     . Magnesium Hydroxide (MILK OF MAGNESIA PO) Take 20 mLs by mouth daily as needed (for constipation).    . Multiple Vitamins-Minerals (CENTRUM SILVER PO) Take 0.5 tablets by mouth 2 (two) times daily.    . nitroGLYCERIN (NITROSTAT) 0.4 MG SL tablet Place 1 tablet (0.4 mg total) under the tongue every 5 (five) minutes as needed. For chest pain 25 tablet 2  . pantoprazole (PROTONIX) 40 MG tablet Take 40 mg by mouth daily.     . Pumpkin Seed-Soy Germ (AZO BLADDER CONTROL/GO-LESS) CAPS Take by mouth as needed.    . rosuvastatin (CRESTOR) 20 MG tablet Take 1 tablet (20 mg total) by mouth daily. 30 tablet 6  . sodium chloride (OCEAN) 0.65 % SOLN nasal spray Place 1 spray into both nostrils as needed for congestion.    . tamsulosin (FLOMAX) 0.4 MG CAPS capsule Take 1 capsule by mouth at bedtime.    . valsartan (DIOVAN) 80 MG tablet Take 0.5 tablets (40 mg total) by mouth daily. Only if SBP > 100 30 tablet 6   No current facility-administered medications for this visit.    Allergies  Allergen Reactions  .  Ramipril Other (See Comments)    cough  . Amoxicillin Rash  . Penicillins Rash    Review of Systems negative except from HPI and PMH  Physical Exam BP 114/68 mmHg  Pulse 61  Ht 5\' 10"  (1.778 m)  Wt 187 lb 11.2 oz (85.14 kg)  BMI 26.93 kg/m2 Well developed and well nourished in no acute distress HENT normal E scleral and icterus clear Neck Supple JVP  flat; carotids brisk and full Clear to ausculation *Regular rate and rhythm, no murmurs gallops or rub Soft with active bowel sounds No clubbing cyanosis 1+ Edema Alert and oriented, grossly normal motor and sensory function Skin Warm and Dry  ECG changed axis   Assessment and  Plan  Ischemic cardiomyopathy]  CRT-d  Left ventricular lead failure to capture  Atrial fibrillation  Heart failure-chronic systolic  Renal insufficiency grade 3-4  His device has detected significant atrial fibrillation.  His CHADS-VASc score is 5 (age-76 heart failure-1 hypertension-1 vascular disease-1)   He is currently taking low-dose apixaban and tolerated well.  He has a little bit of peripheral edema. We will increase his diuretics to have him take 20 daily.40 on Mondays and Fridays. We also noted that his heart rate excursion with mild ambulation went to a heart rate in the 115-20. His rate response was decreased We will check amiodarone surveillance laboratories; his last TSH was low. We will reassess his renal function as he has grade 3-for renal insufficiency and we'll check his digoxin level as well.

## 2015-04-07 NOTE — Patient Instructions (Addendum)
Medication Instructions:  Your physician recommends that you continue on your current medications as directed. Please refer to the Current Medication list given to you today.  Labwork: None ordered  Testing/Procedures: None ordered  Follow-Up: Remote monitoring is used to monitor your ICD from home. This monitoring reduces the number of office visits required to check your device to one time per year. It allows [place] to keep an eye on the functioning of your device to ensure it is working properly. You are scheduled for a device check from home on 07/10/2015. You may send your transmission at any time that day. If you have a wireless device, the transmission will be sent automatically. After your physician reviews your transmission, you will receive a postcard with your next transmission date.  Your physician recommends that you schedule a follow-up appointment in: 12 months with Dr.Klein  Thank you for choosing [facility]!!

## 2015-04-07 NOTE — Addendum Note (Signed)
Addended by: Stanton Kidney on: 04/07/2015 01:03 PM   Modules accepted: Orders, Level of Service

## 2015-04-10 DIAGNOSIS — Z85828 Personal history of other malignant neoplasm of skin: Secondary | ICD-10-CM | POA: Diagnosis not present

## 2015-04-10 DIAGNOSIS — D485 Neoplasm of uncertain behavior of skin: Secondary | ICD-10-CM | POA: Diagnosis not present

## 2015-04-10 DIAGNOSIS — L570 Actinic keratosis: Secondary | ICD-10-CM | POA: Diagnosis not present

## 2015-04-10 DIAGNOSIS — L821 Other seborrheic keratosis: Secondary | ICD-10-CM | POA: Diagnosis not present

## 2015-04-11 LAB — CUP PACEART INCLINIC DEVICE CHECK
Brady Statistic RA Percent Paced: 98 %
Brady Statistic RV Percent Paced: 99.42 %
HighPow Impedance: 47.5313
Lead Channel Impedance Value: 387.5 Ohm
Lead Channel Impedance Value: 437.5 Ohm
Lead Channel Impedance Value: 475 Ohm
Lead Channel Pacing Threshold Amplitude: 0.75 V
Lead Channel Pacing Threshold Amplitude: 0.75 V
Lead Channel Pacing Threshold Amplitude: 1 V
Lead Channel Pacing Threshold Amplitude: 1 V
Lead Channel Pacing Threshold Pulse Width: 0.5 ms
Lead Channel Pacing Threshold Pulse Width: 0.5 ms
Lead Channel Sensing Intrinsic Amplitude: 3.8 mV
Lead Channel Setting Pacing Amplitude: 2 V
Lead Channel Setting Pacing Amplitude: 2 V
Lead Channel Setting Pacing Amplitude: 2.5 V
Lead Channel Setting Pacing Pulse Width: 0.8 ms
Lead Channel Setting Sensing Sensitivity: 0.5 mV
MDC IDC MSMT BATTERY REMAINING LONGEVITY: 48 mo
MDC IDC MSMT LEADCHNL LV PACING THRESHOLD PULSEWIDTH: 0.8 ms
MDC IDC MSMT LEADCHNL LV PACING THRESHOLD PULSEWIDTH: 0.8 ms
MDC IDC MSMT LEADCHNL RV PACING THRESHOLD AMPLITUDE: 1 V
MDC IDC MSMT LEADCHNL RV PACING THRESHOLD AMPLITUDE: 1 V
MDC IDC MSMT LEADCHNL RV PACING THRESHOLD PULSEWIDTH: 0.8 ms
MDC IDC MSMT LEADCHNL RV PACING THRESHOLD PULSEWIDTH: 0.8 ms
MDC IDC MSMT LEADCHNL RV SENSING INTR AMPL: 12 mV
MDC IDC SESS DTM: 20160624162510
MDC IDC SET LEADCHNL RV PACING PULSEWIDTH: 0.8 ms
MDC IDC SET ZONE DETECTION INTERVAL: 250 ms
MDC IDC SET ZONE DETECTION INTERVAL: 300 ms
Pulse Gen Serial Number: 7138995

## 2015-05-18 ENCOUNTER — Other Ambulatory Visit (HOSPITAL_COMMUNITY): Payer: Self-pay | Admitting: Internal Medicine

## 2015-06-22 DIAGNOSIS — N401 Enlarged prostate with lower urinary tract symptoms: Secondary | ICD-10-CM | POA: Diagnosis not present

## 2015-06-22 DIAGNOSIS — C670 Malignant neoplasm of trigone of bladder: Secondary | ICD-10-CM | POA: Diagnosis not present

## 2015-06-22 DIAGNOSIS — N289 Disorder of kidney and ureter, unspecified: Secondary | ICD-10-CM | POA: Diagnosis not present

## 2015-06-22 DIAGNOSIS — R3915 Urgency of urination: Secondary | ICD-10-CM | POA: Diagnosis not present

## 2015-07-06 ENCOUNTER — Other Ambulatory Visit (HOSPITAL_COMMUNITY): Payer: Self-pay | Admitting: Internal Medicine

## 2015-07-10 ENCOUNTER — Ambulatory Visit (INDEPENDENT_AMBULATORY_CARE_PROVIDER_SITE_OTHER): Payer: Medicare Other | Admitting: *Deleted

## 2015-07-10 ENCOUNTER — Encounter: Payer: Self-pay | Admitting: Internal Medicine

## 2015-07-10 DIAGNOSIS — I255 Ischemic cardiomyopathy: Secondary | ICD-10-CM

## 2015-07-11 NOTE — Progress Notes (Signed)
Remote ICD transmission.   

## 2015-07-14 LAB — CUP PACEART REMOTE DEVICE CHECK
Battery Remaining Longevity: 49 mo
Battery Voltage: 2.96 V
Brady Statistic AP VP Percent: 99 %
Brady Statistic AP VS Percent: 1 %
Brady Statistic AS VP Percent: 1 %
Brady Statistic AS VS Percent: 1 %
Brady Statistic RA Percent Paced: 99 %
Date Time Interrogation Session: 20160926060018
HighPow Impedance: 47 Ohm
HighPow Impedance: 47 Ohm
Lead Channel Impedance Value: 400 Ohm
Lead Channel Impedance Value: 480 Ohm
Lead Channel Pacing Threshold Amplitude: 1 V
Lead Channel Pacing Threshold Amplitude: 1 V
Lead Channel Sensing Intrinsic Amplitude: 12 mV
Lead Channel Sensing Intrinsic Amplitude: 4.2 mV
Lead Channel Setting Pacing Amplitude: 2 V
Lead Channel Setting Pacing Amplitude: 2 V
Lead Channel Setting Pacing Amplitude: 2.5 V
Lead Channel Setting Pacing Pulse Width: 0.8 ms
Lead Channel Setting Pacing Pulse Width: 0.8 ms
MDC IDC MSMT BATTERY REMAINING PERCENTAGE: 70 %
MDC IDC MSMT LEADCHNL LV PACING THRESHOLD PULSEWIDTH: 0.8 ms
MDC IDC MSMT LEADCHNL RA PACING THRESHOLD AMPLITUDE: 0.75 V
MDC IDC MSMT LEADCHNL RA PACING THRESHOLD PULSEWIDTH: 0.5 ms
MDC IDC MSMT LEADCHNL RV IMPEDANCE VALUE: 380 Ohm
MDC IDC MSMT LEADCHNL RV PACING THRESHOLD PULSEWIDTH: 0.8 ms
MDC IDC PG SERIAL: 7138995
MDC IDC SET LEADCHNL RV SENSING SENSITIVITY: 0.5 mV
Zone Setting Detection Interval: 250 ms
Zone Setting Detection Interval: 300 ms

## 2015-07-15 DIAGNOSIS — Z23 Encounter for immunization: Secondary | ICD-10-CM | POA: Diagnosis not present

## 2015-07-25 ENCOUNTER — Encounter: Payer: Self-pay | Admitting: Cardiology

## 2015-07-25 DIAGNOSIS — R739 Hyperglycemia, unspecified: Secondary | ICD-10-CM | POA: Diagnosis not present

## 2015-07-25 DIAGNOSIS — I10 Essential (primary) hypertension: Secondary | ICD-10-CM | POA: Diagnosis not present

## 2015-07-25 DIAGNOSIS — E784 Other hyperlipidemia: Secondary | ICD-10-CM | POA: Diagnosis not present

## 2015-07-25 DIAGNOSIS — R8290 Unspecified abnormal findings in urine: Secondary | ICD-10-CM | POA: Diagnosis not present

## 2015-07-25 DIAGNOSIS — Z0000 Encounter for general adult medical examination without abnormal findings: Secondary | ICD-10-CM | POA: Diagnosis not present

## 2015-07-25 DIAGNOSIS — E785 Hyperlipidemia, unspecified: Secondary | ICD-10-CM | POA: Diagnosis not present

## 2015-07-25 DIAGNOSIS — Z125 Encounter for screening for malignant neoplasm of prostate: Secondary | ICD-10-CM | POA: Diagnosis not present

## 2015-07-25 DIAGNOSIS — I2510 Atherosclerotic heart disease of native coronary artery without angina pectoris: Secondary | ICD-10-CM | POA: Diagnosis not present

## 2015-07-30 ENCOUNTER — Other Ambulatory Visit (HOSPITAL_COMMUNITY): Payer: Self-pay | Admitting: Internal Medicine

## 2015-08-01 DIAGNOSIS — I130 Hypertensive heart and chronic kidney disease with heart failure and stage 1 through stage 4 chronic kidney disease, or unspecified chronic kidney disease: Secondary | ICD-10-CM | POA: Diagnosis not present

## 2015-08-01 DIAGNOSIS — N184 Chronic kidney disease, stage 4 (severe): Secondary | ICD-10-CM | POA: Diagnosis not present

## 2015-08-01 DIAGNOSIS — D485 Neoplasm of uncertain behavior of skin: Secondary | ICD-10-CM | POA: Diagnosis not present

## 2015-08-01 DIAGNOSIS — I509 Heart failure, unspecified: Secondary | ICD-10-CM | POA: Insufficient documentation

## 2015-08-01 DIAGNOSIS — R739 Hyperglycemia, unspecified: Secondary | ICD-10-CM | POA: Diagnosis not present

## 2015-08-01 DIAGNOSIS — Z85828 Personal history of other malignant neoplasm of skin: Secondary | ICD-10-CM | POA: Diagnosis not present

## 2015-08-01 DIAGNOSIS — D696 Thrombocytopenia, unspecified: Secondary | ICD-10-CM | POA: Diagnosis not present

## 2015-08-01 DIAGNOSIS — C679 Malignant neoplasm of bladder, unspecified: Secondary | ICD-10-CM | POA: Diagnosis not present

## 2015-08-01 DIAGNOSIS — I255 Ischemic cardiomyopathy: Secondary | ICD-10-CM | POA: Diagnosis not present

## 2015-08-01 DIAGNOSIS — Z6827 Body mass index (BMI) 27.0-27.9, adult: Secondary | ICD-10-CM | POA: Diagnosis not present

## 2015-08-01 DIAGNOSIS — C44629 Squamous cell carcinoma of skin of left upper limb, including shoulder: Secondary | ICD-10-CM | POA: Diagnosis not present

## 2015-08-01 DIAGNOSIS — D649 Anemia, unspecified: Secondary | ICD-10-CM | POA: Diagnosis not present

## 2015-08-01 DIAGNOSIS — R251 Tremor, unspecified: Secondary | ICD-10-CM | POA: Diagnosis not present

## 2015-08-01 DIAGNOSIS — D692 Other nonthrombocytopenic purpura: Secondary | ICD-10-CM | POA: Insufficient documentation

## 2015-08-01 DIAGNOSIS — Z0000 Encounter for general adult medical examination without abnormal findings: Secondary | ICD-10-CM | POA: Diagnosis not present

## 2015-08-21 DIAGNOSIS — N184 Chronic kidney disease, stage 4 (severe): Secondary | ICD-10-CM | POA: Diagnosis not present

## 2015-08-21 DIAGNOSIS — I509 Heart failure, unspecified: Secondary | ICD-10-CM | POA: Diagnosis not present

## 2015-08-21 DIAGNOSIS — I10 Essential (primary) hypertension: Secondary | ICD-10-CM | POA: Diagnosis not present

## 2015-08-21 DIAGNOSIS — D472 Monoclonal gammopathy: Secondary | ICD-10-CM | POA: Diagnosis not present

## 2015-08-24 ENCOUNTER — Other Ambulatory Visit (HOSPITAL_COMMUNITY): Payer: Self-pay | Admitting: Internal Medicine

## 2015-08-30 ENCOUNTER — Other Ambulatory Visit (HOSPITAL_COMMUNITY): Payer: Self-pay | Admitting: Internal Medicine

## 2015-09-11 ENCOUNTER — Other Ambulatory Visit (HOSPITAL_COMMUNITY): Payer: Self-pay | Admitting: Internal Medicine

## 2015-09-18 ENCOUNTER — Other Ambulatory Visit (HOSPITAL_COMMUNITY): Payer: Self-pay | Admitting: Internal Medicine

## 2015-09-20 ENCOUNTER — Other Ambulatory Visit (HOSPITAL_COMMUNITY): Payer: Self-pay | Admitting: Internal Medicine

## 2015-10-03 DIAGNOSIS — L570 Actinic keratosis: Secondary | ICD-10-CM | POA: Diagnosis not present

## 2015-10-03 DIAGNOSIS — L821 Other seborrheic keratosis: Secondary | ICD-10-CM | POA: Diagnosis not present

## 2015-10-03 DIAGNOSIS — Z85828 Personal history of other malignant neoplasm of skin: Secondary | ICD-10-CM | POA: Diagnosis not present

## 2015-10-03 DIAGNOSIS — L812 Freckles: Secondary | ICD-10-CM | POA: Diagnosis not present

## 2015-10-10 ENCOUNTER — Ambulatory Visit (INDEPENDENT_AMBULATORY_CARE_PROVIDER_SITE_OTHER): Payer: Medicare Other | Admitting: *Deleted

## 2015-10-10 DIAGNOSIS — I255 Ischemic cardiomyopathy: Secondary | ICD-10-CM | POA: Diagnosis not present

## 2015-10-10 NOTE — Progress Notes (Signed)
Remote ICD transmission.   

## 2015-10-13 ENCOUNTER — Encounter: Payer: Self-pay | Admitting: Cardiology

## 2015-10-13 LAB — CUP PACEART REMOTE DEVICE CHECK
Brady Statistic AP VP Percent: 99 %
Brady Statistic AP VS Percent: 1 %
Brady Statistic AS VP Percent: 1 %
Brady Statistic AS VS Percent: 1 %
Brady Statistic RA Percent Paced: 99 %
Date Time Interrogation Session: 20161227070014
HIGH POWER IMPEDANCE MEASURED VALUE: 53 Ohm
HighPow Impedance: 54 Ohm
Implantable Lead Implant Date: 20060509
Implantable Lead Implant Date: 20090417
Implantable Lead Location: 753858
Implantable Lead Location: 753859
Implantable Lead Location: 753860
Implantable Lead Model: 7001
Lead Channel Impedance Value: 390 Ohm
Lead Channel Setting Pacing Amplitude: 2 V
Lead Channel Setting Pacing Amplitude: 2.5 V
Lead Channel Setting Pacing Pulse Width: 0.8 ms
Lead Channel Setting Pacing Pulse Width: 0.8 ms
Lead Channel Setting Sensing Sensitivity: 0.5 mV
MDC IDC LEAD IMPLANT DT: 20060509
MDC IDC MSMT BATTERY REMAINING LONGEVITY: 47 mo
MDC IDC MSMT BATTERY REMAINING PERCENTAGE: 66 %
MDC IDC MSMT BATTERY VOLTAGE: 2.96 V
MDC IDC MSMT LEADCHNL LV IMPEDANCE VALUE: 510 Ohm
MDC IDC MSMT LEADCHNL RA IMPEDANCE VALUE: 410 Ohm
MDC IDC MSMT LEADCHNL RA SENSING INTR AMPL: 2 mV
MDC IDC SET LEADCHNL RA PACING AMPLITUDE: 2 V
Pulse Gen Serial Number: 7138995

## 2015-10-18 ENCOUNTER — Other Ambulatory Visit (HOSPITAL_COMMUNITY): Payer: Self-pay | Admitting: Pharmacist

## 2015-10-18 MED ORDER — ROSUVASTATIN CALCIUM 20 MG PO TABS
20.0000 mg | ORAL_TABLET | Freq: Every day | ORAL | Status: DC
Start: 2015-10-18 — End: 2021-07-16

## 2015-10-18 NOTE — Telephone Encounter (Signed)
[employer] prefers generic rosuvastatin so Crestor will be switched. Rx for rosuvastatin sent to pharmacy.   [name]. Velva Harman, PharmD, BCPS, CPP Clinical Pharmacist Pager: [phone] Phone: [phone] 10/18/2015 4:14 PM

## 2015-10-19 ENCOUNTER — Ambulatory Visit (HOSPITAL_COMMUNITY)
Admission: RE | Admit: 2015-10-19 | Discharge: 2015-10-19 | Disposition: A | Discharge status: Home / Self Care | Payer: Medicare Other | Source: Ambulatory Visit | Attending: Internal Medicine | Admitting: Internal Medicine

## 2015-10-19 ENCOUNTER — Ambulatory Visit (HOSPITAL_BASED_OUTPATIENT_CLINIC_OR_DEPARTMENT_OTHER)
Admission: RE | Admit: 2015-10-19 | Discharge: 2015-10-19 | Disposition: A | Discharge status: Home / Self Care | Payer: Medicare Other | Source: Ambulatory Visit | Attending: Internal Medicine | Admitting: Internal Medicine

## 2015-10-19 ENCOUNTER — Encounter (HOSPITAL_COMMUNITY): Payer: Self-pay | Admitting: Internal Medicine

## 2015-10-19 VITALS — BP 108/56 | HR 66 | Wt 192.2 lb

## 2015-10-19 DIAGNOSIS — E782 Mixed hyperlipidemia: Secondary | ICD-10-CM | POA: Diagnosis not present

## 2015-10-19 DIAGNOSIS — I5022 Chronic systolic (congestive) heart failure: Secondary | ICD-10-CM

## 2015-10-19 DIAGNOSIS — I480 Paroxysmal atrial fibrillation: Secondary | ICD-10-CM | POA: Diagnosis not present

## 2015-10-19 DIAGNOSIS — I2510 Atherosclerotic heart disease of native coronary artery without angina pectoris: Secondary | ICD-10-CM | POA: Diagnosis not present

## 2015-10-19 DIAGNOSIS — I517 Cardiomegaly: Secondary | ICD-10-CM | POA: Diagnosis not present

## 2015-10-19 DIAGNOSIS — I5189 Other ill-defined heart diseases: Secondary | ICD-10-CM | POA: Insufficient documentation

## 2015-10-19 DIAGNOSIS — R29898 Other symptoms and signs involving the musculoskeletal system: Secondary | ICD-10-CM | POA: Insufficient documentation

## 2015-10-19 DIAGNOSIS — I10 Essential (primary) hypertension: Secondary | ICD-10-CM | POA: Diagnosis not present

## 2015-10-19 NOTE — Progress Notes (Signed)
  Echocardiogram 2D Echocardiogram has been performed.  [name] 10/19/2015, 12:26 PM

## 2015-10-19 NOTE — Progress Notes (Signed)
Patient ID: [name], male   DOB: 1931-12-05, 82 y.o.   MRN: [id number]    Advanced Heart Failure Clinic Note   PCP: Virgina Jock  HPI: [name] is delightful 82 year old male with a history of coronary artery disease, status post previous large anterior wall myocardial infarction s/p multivessel stenting, systolic CHF with EF 123456 s/p St. Jude BiV ICD.  Remainder of his medical history is notable for atrial fibrillation,maintaining sinus rhythm on amiodarone.  He is not on Coumadin secondary to GI bleed, chronic renal insufficiency with baseline creatinine about 2.2-2.5, hypertension, hyperlipidemia, and a systemic tremor.Bladder cancer 05/2012. Completed BCG treatment.   2015 found that his left ventricular lead has not been capturing suggestive of either fracture of the proximal electrode or an issue at the header. They reprogrammed the device to use to the RV coil which he tolerated well and had a good threshold.    He returns today for regular follow up. Says he has been feeling good, for an "82 year old".  Says he gets fatigued after moderate exertion, but denies SOB. Has not had any edema. Taking his lasix 20 mg daily and has not needed any extra. Denies orthopnea, PND, or bendopnea. Is not limited in his activities. Keeps up with his wife at the grocery store. Weight stable within a few lbs at home.  Comes up over holidays but balances out on current dose of lasix.   Echo 12/14 LVEF 20-25% Carotid u/s 3/14: 40-59% R 0-39%%L Echo today shows: EF 25%   Labs  12/20/13: K 4.1 Cr 2.7 Hgb 11.9  06/07/14: K 4.5 Cr 2.0 03/2015 K 4.6, Cr 2.7   ROS: All systems negative except as listed in HPI, PMH and Problem List.  Past Medical History  Diagnosis Date  . CAD (coronary artery disease)      a. s/p anterior MI 12/05 c/b shock -> stent LAD   b. s/p stenting OM-1, 2/06  . CHF (congestive heart failure) (HCC)     due to ischemic CM  a. EF 20-30%. (Nov 2008)   b. s/p St. Jude BiV-ICD    c. CPX  07/2008  pvo2 16.3 (63% predicted) slope 34 RER 1.08 O2 pulse 93%  . Hyperlipidemia   . Hypothyroidism   . GERD (gastroesophageal reflux disease)     hx Barretts esophagitis  . Anemia   . Neuromuscular disorder ([place])     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"   no neurologist  . History of blood transfusion     following coumadin  usage  . Cough     occasional /productive, no fever/ not new  . Skin cancer     basal cells   facial x 4, 1 right forearm  . Bladder cancer ([name]) dx'd 05/2012  . HTN (hypertension)     EKG 05/14/12,Chest x ray 8/13, last ICD interrogation 8/13 EPIC  . Atrial fibrillation or flutter     maintaining sinus on amiodarone    . Sleep apnea     STOP BANG SCORE 4  . CRI (chronic renal insufficiency)     (baseline 2.0-2.2)/ recent hospitalization 8/13  . 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 acetaminophen (TYLENOL) 325 MG tablet Take 2 tablets (650 mg total) by mouth every 6 (six) hours as needed for mild pain (or Fever >/= 101).    Marland Kitchen amiodarone (PACERONE) 100 MG tablet Take 1 tablet (100  mg total) by mouth daily.    . carvedilol (COREG) 6.25 MG tablet TAKE ONE TABLET BY MOUTH TWICE DAILY WITH MEALS 60 tablet 3  . digoxin (LANOXIN) 0.125 MG tablet TAKE ONE-HALF TABLET BY MOUTH ONCE DAILY 15 tablet 3  . ELIQUIS 2.5 MG TABS tablet TAKE ONE TABLET BY MOUTH TWICE DAILY 60 tablet 3  . ezetimibe (ZETIA) 10 MG tablet TAKE ONE TABLET BY MOUTH EVERY DAY WITH  SUPPER 90 tablet 3  . ferrous sulfate (CVS IRON) 325 (65 FE) MG tablet Take 325 mg by mouth every Monday, Wednesday, and Friday.     . furosemide (LASIX) 20 MG tablet Take 20 mg by mouth daily.     Marland Kitchen KLOR-CON M10 10 MEQ tablet Take 10 mEq by mouth daily.     Marland Kitchen levothyroxine (SYNTHROID, LEVOTHROID) 50 MCG tablet Take 25 mcg by mouth daily before breakfast.     . Magnesium Hydroxide (MILK OF MAGNESIA PO) Take 20 mLs by  mouth daily as needed (for constipation).    . Multiple Vitamins-Minerals (CENTRUM SILVER PO) Take 0.5 tablets by mouth 2 (two) times daily.    . pantoprazole (PROTONIX) 40 MG tablet Take 40 mg by mouth daily.     . Pumpkin Seed-Soy Germ (AZO BLADDER CONTROL/GO-LESS) CAPS Take by mouth as needed.    . rosuvastatin (CRESTOR) 20 MG tablet Take 1 tablet (20 mg total) by mouth daily. 90 tablet 3  . sodium chloride (OCEAN) 0.65 % SOLN nasal spray Place 1 spray into both nostrils as needed for congestion.    . tamsulosin (FLOMAX) 0.4 MG CAPS capsule Take 1 capsule by mouth at bedtime.    . valsartan (DIOVAN) 80 MG tablet Take 0.5 tablets (40 mg total) by mouth daily. Only if SBP > 100 30 tablet 6  . hydrOXYzine (ATARAX/VISTARIL) 25 MG tablet Take 1 tablet by mouth every 4 (four) hours as needed for itching. Reported on 10/19/2015    . nitroGLYCERIN (NITROSTAT) 0.4 MG SL tablet Place 1 tablet (0.4 mg total) under the tongue every 5 (five) minutes as needed. For chest pain (Patient not taking: Reported on 10/19/2015) 25 tablet 2   No current facility-administered medications for this encounter.     PHYSICAL EXAM: Filed Vitals:   10/19/15 1221  BP: 108/56  Pulse: 66    General:  Elderly. NAD HEENT: normal Neck: supple. JVP 6-7 cm with very mild HJR.  Carotids 2+ bilat; no bruits. Cor: PMI laterally displaced. RRR, No rubs, gallops, 2/6 SEM murmur at apex. ICD site ok Lungs: CTAB, normal effort.  Abdomen: soft, NT, ND, no HSM. No bruits or masses. +BS  Extremities: no cyanosis, clubbing, rash. Trace bilateral LE edema. +varicose veins.  Neuro: alert & orientedx3, cranial nerves grossly intact. moves all 4 extremities w/o difficulty. affect pleasant +diffuse tremor   ASSESSMENT & PLAN: 1. Chronic systolic CHF. ECHO reviewed personally today EF 25%    -- Stable with NYHA II-III symptoms. Volume status stable.     -- Continue lasix 20 mg daily.     -- He has not been able to tolerate uptitration  of Coreg or valsartan, Continue Coreg 6.25 BID and Valsartan 40 mg daily if BP > 100.  2. CAD     -- Denies CP. Off ASA due to Eliquis.      -- Continue statin.  3. PAF       --IN NSR. Continue amio 100 daily.       -- No bleeding on Eliquis. 4.  Carotid ultrasound       -- 1-39% bilaterally on last scan. No repeat warranted. 5. CKD, Stage IV       -- Sees Dr. Joelyn Oms at [facility] 6. Essential Tremors       -- Followed by PCP. Had a neurologic work up and negative for [employer].     Follow up in 6 months, or prn for symptoms.   Shirley Friar, PA-C  12:57 PM  Patient seen and examined with Oda Kilts, PA-C. We discussed all aspects of the encounter. I agree with the assessment and plan as stated above.   Overall doing well NYHA II-III. Volume status looks good. Echo images reviewed personally and EF stable at 25%. He is s/p ICD. Unable to tolerate further titration of HF meds. Remains in NSR on amio. Not coumadin candidate. CAD stable. Lipids followed by Dr. Virgina Jock.   Danne Vasek,MD 10:55 PM

## 2015-11-13 ENCOUNTER — Other Ambulatory Visit (HOSPITAL_COMMUNITY): Payer: Self-pay | Admitting: Internal Medicine

## 2015-11-29 ENCOUNTER — Telehealth: Payer: Self-pay | Admitting: *Deleted

## 2015-11-29 NOTE — Telephone Encounter (Signed)
Pharmacist left msg on primary care triage stating pt is needing refills on his Zetia. Rx MD is Bensimhon. Wrong office Dr. Haroldine Laws is his cardiologist. Acquanetta Belling msg to [place] [facility] Dr. Haroldine Laws...[name]

## 2015-12-01 ENCOUNTER — Other Ambulatory Visit (HOSPITAL_COMMUNITY): Payer: Self-pay | Admitting: *Deleted

## 2015-12-01 MED ORDER — EZETIMIBE 10 MG PO TABS: ORAL_TABLET | ORAL | Status: DC

## 2015-12-18 DIAGNOSIS — Z0000 Encounter for general adult medical examination without abnormal findings: Secondary | ICD-10-CM | POA: Diagnosis not present

## 2015-12-18 DIAGNOSIS — C670 Malignant neoplasm of trigone of bladder: Secondary | ICD-10-CM | POA: Diagnosis not present

## 2015-12-18 DIAGNOSIS — N289 Disorder of kidney and ureter, unspecified: Secondary | ICD-10-CM | POA: Diagnosis not present

## 2015-12-18 DIAGNOSIS — R3915 Urgency of urination: Secondary | ICD-10-CM | POA: Diagnosis not present

## 2015-12-18 DIAGNOSIS — N401 Enlarged prostate with lower urinary tract symptoms: Secondary | ICD-10-CM | POA: Diagnosis not present

## 2015-12-19 ENCOUNTER — Other Ambulatory Visit (HOSPITAL_COMMUNITY): Payer: Self-pay | Admitting: Internal Medicine

## 2015-12-27 ENCOUNTER — Other Ambulatory Visit (HOSPITAL_COMMUNITY): Payer: Self-pay

## 2015-12-27 ENCOUNTER — Telehealth (HOSPITAL_COMMUNITY): Payer: Self-pay

## 2015-12-27 MED ORDER — EZETIMIBE 10 MG PO TABS: ORAL_TABLET | ORAL | Status: DC

## 2015-12-27 NOTE — Telephone Encounter (Signed)
Refill for zetia sent per patient request

## 2016-01-08 ENCOUNTER — Other Ambulatory Visit (HOSPITAL_COMMUNITY): Payer: Self-pay | Admitting: Internal Medicine

## 2016-01-09 ENCOUNTER — Ambulatory Visit (INDEPENDENT_AMBULATORY_CARE_PROVIDER_SITE_OTHER): Payer: Medicare Other | Admitting: *Deleted

## 2016-01-09 DIAGNOSIS — Z95810 Presence of automatic (implantable) cardiac defibrillator: Secondary | ICD-10-CM

## 2016-01-09 DIAGNOSIS — I255 Ischemic cardiomyopathy: Secondary | ICD-10-CM

## 2016-01-09 NOTE — Progress Notes (Signed)
Remote ICD transmission.   

## 2016-01-24 ENCOUNTER — Other Ambulatory Visit (HOSPITAL_COMMUNITY): Payer: Self-pay | Admitting: Internal Medicine

## 2016-01-29 ENCOUNTER — Other Ambulatory Visit (HOSPITAL_COMMUNITY): Payer: Self-pay | Admitting: Internal Medicine

## 2016-02-05 DIAGNOSIS — I7389 Other specified peripheral vascular diseases: Secondary | ICD-10-CM | POA: Diagnosis not present

## 2016-02-05 DIAGNOSIS — I255 Ischemic cardiomyopathy: Secondary | ICD-10-CM | POA: Diagnosis not present

## 2016-02-05 DIAGNOSIS — E038 Other specified hypothyroidism: Secondary | ICD-10-CM | POA: Diagnosis not present

## 2016-02-05 DIAGNOSIS — N184 Chronic kidney disease, stage 4 (severe): Secondary | ICD-10-CM | POA: Diagnosis not present

## 2016-02-05 DIAGNOSIS — Z950 Presence of cardiac pacemaker: Secondary | ICD-10-CM | POA: Diagnosis not present

## 2016-02-05 DIAGNOSIS — R627 Adult failure to thrive: Secondary | ICD-10-CM | POA: Diagnosis not present

## 2016-02-05 DIAGNOSIS — I130 Hypertensive heart and chronic kidney disease with heart failure and stage 1 through stage 4 chronic kidney disease, or unspecified chronic kidney disease: Secondary | ICD-10-CM | POA: Diagnosis not present

## 2016-02-05 DIAGNOSIS — E784 Other hyperlipidemia: Secondary | ICD-10-CM | POA: Diagnosis not present

## 2016-02-05 DIAGNOSIS — C679 Malignant neoplasm of bladder, unspecified: Secondary | ICD-10-CM | POA: Diagnosis not present

## 2016-02-05 DIAGNOSIS — D692 Other nonthrombocytopenic purpura: Secondary | ICD-10-CM | POA: Diagnosis not present

## 2016-02-05 DIAGNOSIS — I509 Heart failure, unspecified: Secondary | ICD-10-CM | POA: Diagnosis not present

## 2016-02-05 DIAGNOSIS — Z6828 Body mass index (BMI) 28.0-28.9, adult: Secondary | ICD-10-CM | POA: Diagnosis not present

## 2016-02-07 DIAGNOSIS — Z79899 Other long term (current) drug therapy: Secondary | ICD-10-CM | POA: Diagnosis not present

## 2016-02-07 DIAGNOSIS — E039 Hypothyroidism, unspecified: Secondary | ICD-10-CM | POA: Diagnosis not present

## 2016-02-07 DIAGNOSIS — E785 Hyperlipidemia, unspecified: Secondary | ICD-10-CM | POA: Diagnosis not present

## 2016-02-07 DIAGNOSIS — N184 Chronic kidney disease, stage 4 (severe): Secondary | ICD-10-CM | POA: Diagnosis not present

## 2016-02-07 LAB — CUP PACEART REMOTE DEVICE CHECK
Battery Remaining Longevity: 44 mo
Battery Remaining Percentage: 63 %
Battery Voltage: 2.96 V
Brady Statistic AP VP Percent: 99 %
Brady Statistic AP VS Percent: 1 %
Brady Statistic AS VP Percent: 1 %
Brady Statistic AS VS Percent: 1 %
Brady Statistic RA Percent Paced: 99 %
Date Time Interrogation Session: 20170328060017
HighPow Impedance: 48 Ohm
HighPow Impedance: 48 Ohm
Implantable Lead Implant Date: 20060509
Implantable Lead Implant Date: 20060509
Implantable Lead Implant Date: 20090417
Implantable Lead Location: 753858
Implantable Lead Location: 753859
Implantable Lead Location: 753860
Implantable Lead Model: 5076
Implantable Lead Model: 7001
Lead Channel Impedance Value: 360 Ohm
Lead Channel Impedance Value: 410 Ohm
Lead Channel Impedance Value: 460 Ohm
Lead Channel Pacing Threshold Amplitude: 0.75 V
Lead Channel Pacing Threshold Amplitude: 1 V
Lead Channel Pacing Threshold Amplitude: 1 V
Lead Channel Pacing Threshold Pulse Width: 0.5 ms
Lead Channel Pacing Threshold Pulse Width: 0.8 ms
Lead Channel Pacing Threshold Pulse Width: 0.8 ms
Lead Channel Sensing Intrinsic Amplitude: 12 mV
Lead Channel Sensing Intrinsic Amplitude: 2.1 mV
Lead Channel Setting Pacing Amplitude: 2 V
Lead Channel Setting Pacing Amplitude: 2 V
Lead Channel Setting Pacing Amplitude: 2.5 V
Lead Channel Setting Pacing Pulse Width: 0.8 ms
Lead Channel Setting Pacing Pulse Width: 0.8 ms
Lead Channel Setting Sensing Sensitivity: 0.5 mV
Pulse Gen Serial Number: 7138995

## 2016-02-09 ENCOUNTER — Encounter: Payer: Self-pay | Admitting: Cardiology

## 2016-02-12 ENCOUNTER — Other Ambulatory Visit (HOSPITAL_COMMUNITY): Payer: Self-pay | Admitting: Internal Medicine

## 2016-02-15 ENCOUNTER — Telehealth (HOSPITAL_COMMUNITY): Payer: Self-pay | Admitting: *Deleted

## 2016-02-15 NOTE — Telephone Encounter (Signed)
02/12/16 received labs from pt's PCP which showed Dig level 1.0, per Dr Haroldine Laws cut dig dose in half  02/13/16 spoke w/pt's wife, she states pt is on Dig 0.125 mg and takes a 1/2 tab daily for total dose of 0.0625 daily, which is lowest dose.    02/13/16 discuss dose w/Dr Bensimhon, he would like pt to stop Dig completely, attempted to call pt and left mess for him to call us back  02/15/16 spoke w/pt and wife, they are aware to stop Dig and agreeable.

## 2016-02-16 ENCOUNTER — Other Ambulatory Visit (HOSPITAL_COMMUNITY): Payer: Self-pay | Admitting: Internal Medicine

## 2016-02-19 DIAGNOSIS — I10 Essential (primary) hypertension: Secondary | ICD-10-CM | POA: Diagnosis not present

## 2016-02-19 DIAGNOSIS — N184 Chronic kidney disease, stage 4 (severe): Secondary | ICD-10-CM | POA: Diagnosis not present

## 2016-02-19 DIAGNOSIS — D472 Monoclonal gammopathy: Secondary | ICD-10-CM | POA: Diagnosis not present

## 2016-04-09 DIAGNOSIS — D692 Other nonthrombocytopenic purpura: Secondary | ICD-10-CM | POA: Diagnosis not present

## 2016-04-09 DIAGNOSIS — Z85828 Personal history of other malignant neoplasm of skin: Secondary | ICD-10-CM | POA: Diagnosis not present

## 2016-04-09 DIAGNOSIS — D0439 Carcinoma in situ of skin of other parts of face: Secondary | ICD-10-CM | POA: Diagnosis not present

## 2016-04-09 DIAGNOSIS — L570 Actinic keratosis: Secondary | ICD-10-CM | POA: Diagnosis not present

## 2016-04-09 DIAGNOSIS — C44319 Basal cell carcinoma of skin of other parts of face: Secondary | ICD-10-CM | POA: Diagnosis not present

## 2016-04-09 DIAGNOSIS — L821 Other seborrheic keratosis: Secondary | ICD-10-CM | POA: Diagnosis not present

## 2016-04-09 DIAGNOSIS — D485 Neoplasm of uncertain behavior of skin: Secondary | ICD-10-CM | POA: Diagnosis not present

## 2016-04-18 ENCOUNTER — Encounter: Payer: Medicare Other | Admitting: Internal Medicine

## 2016-04-24 ENCOUNTER — Encounter: Payer: Self-pay | Admitting: Internal Medicine

## 2016-04-24 ENCOUNTER — Ambulatory Visit (INDEPENDENT_AMBULATORY_CARE_PROVIDER_SITE_OTHER): Payer: Medicare Other | Admitting: Internal Medicine

## 2016-04-24 ENCOUNTER — Encounter (INDEPENDENT_AMBULATORY_CARE_PROVIDER_SITE_OTHER): Payer: Self-pay

## 2016-04-24 VITALS — BP 112/58 | HR 60 | Ht 70.0 in | Wt 192.0 lb

## 2016-04-24 DIAGNOSIS — Z95810 Presence of automatic (implantable) cardiac defibrillator: Secondary | ICD-10-CM

## 2016-04-24 DIAGNOSIS — I5022 Chronic systolic (congestive) heart failure: Secondary | ICD-10-CM

## 2016-04-24 DIAGNOSIS — I255 Ischemic cardiomyopathy: Secondary | ICD-10-CM

## 2016-04-24 DIAGNOSIS — I480 Paroxysmal atrial fibrillation: Secondary | ICD-10-CM

## 2016-04-24 LAB — CUP PACEART INCLINIC DEVICE CHECK
Brady Statistic RV Percent Paced: 99.95 %
HighPow Impedance: 50.8696
Implantable Lead Implant Date: 20060509
Implantable Lead Implant Date: 20090417
Implantable Lead Location: 753859
Implantable Lead Location: 753860
Implantable Lead Model: 5076
Implantable Lead Model: 7001
Lead Channel Impedance Value: 375 Ohm
Lead Channel Impedance Value: 450 Ohm
Lead Channel Pacing Threshold Amplitude: 0.75 V
Lead Channel Pacing Threshold Pulse Width: 0.8 ms
Lead Channel Pacing Threshold Pulse Width: 0.8 ms
Lead Channel Sensing Intrinsic Amplitude: 3.5 mV
Lead Channel Setting Pacing Pulse Width: 0.8 ms
Lead Channel Setting Sensing Sensitivity: 0.5 mV
MDC IDC LEAD IMPLANT DT: 20060509
MDC IDC LEAD LOCATION: 753858
MDC IDC MSMT BATTERY REMAINING LONGEVITY: 39.6
MDC IDC MSMT LEADCHNL LV IMPEDANCE VALUE: 512.5 Ohm
MDC IDC MSMT LEADCHNL RA PACING THRESHOLD AMPLITUDE: 1 V
MDC IDC MSMT LEADCHNL RA PACING THRESHOLD PULSEWIDTH: 0.5 ms
MDC IDC MSMT LEADCHNL RV PACING THRESHOLD AMPLITUDE: 1.25 V
MDC IDC MSMT LEADCHNL RV SENSING INTR AMPL: 12 mV
MDC IDC SESS DTM: 20170712153442
MDC IDC SET LEADCHNL LV PACING AMPLITUDE: 2 V
MDC IDC SET LEADCHNL RA PACING AMPLITUDE: 2 V
MDC IDC SET LEADCHNL RV PACING AMPLITUDE: 2.5 V
MDC IDC SET LEADCHNL RV PACING PULSEWIDTH: 0.8 ms
MDC IDC STAT BRADY RA PERCENT PACED: 99.17 %
Pulse Gen Serial Number: 7138995

## 2016-04-24 NOTE — Patient Instructions (Signed)
Medication Instructions: - Your physician recommends that you continue on your current medications as directed. Please refer to the Current Medication list given to you today.  Labwork: - none  Procedures/Testing: - none  Follow-Up: - Remote monitoring is used to monitor your Pacemaker of ICD from home. This monitoring reduces the number of office visits required to check your device to one time per year. It allows [place] to keep an eye on the functioning of your device to ensure it is working properly. You are scheduled for a device check from home on 07/24/16. You may send your transmission at any time that day. If you have a wireless device, the transmission will be sent automatically. After your physician reviews your transmission, you will receive a postcard with your next transmission date.  - Your physician wants you to follow-up in: 1 year with Chanetta Marshall, NP for Dr. Caryl Comes. You will receive a reminder letter in the mail two months in advance. If you don't receive a letter, please call our office to schedule the follow-up appointment.  Any Additional Special Instructions Will Be Listed Below (If Applicable).     If you need a refill on your cardiac medications before your next appointment, please call your pharmacy.

## 2016-04-24 NOTE — Progress Notes (Signed)
Patient Care Team: [name], MD as PCP - General (Internal Medicine)   HPI  [name] is a 82 y.o. male seen in followup for ischemic heart myopathy congestive heart failure and prior CRT-D with generator replaced 12/14   We  reprogrammed his device at the last visit to exclude the proximal ring on the LV lead. He has been doing much better. He has had no significant shortness of breath or chest pain.  He has had no palpitations. He has had problems with atrial fibrillation. He is managed with amiodarone he had problems with neurotoxicity with gait instability and his amiodarone level has been decreased. Digoxin was discontinued   4/17 creatinine 2.5 TSH 4.47     Past Medical History  Diagnosis Date  . CAD (coronary artery disease)      a. s/p anterior MI 12/05 c/b shock -> stent LAD   b. s/p stenting OM-1, 2/06  . CHF (congestive heart failure) (HCC)     due to ischemic CM  a. EF 20-30%. (Nov 2008)   b. s/p St. Jude BiV-ICD    c. CPX 07/2008  pvo2 16.3 (63% predicted) slope 34 RER 1.08 O2 pulse 93%  . Hyperlipidemia   . Hypothyroidism   . GERD (gastroesophageal reflux disease)     hx Barretts esophagitis  . Anemia   . Neuromuscular disorder ([place])     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"   no neurologist  . History of blood transfusion     following coumadin  usage  . Cough     occasional /productive, no fever/ not new  . Skin cancer     basal cells   facial x 4, 1 right forearm  . Bladder cancer ([place]) dx'd 05/2012  . HTN (hypertension)     EKG 05/14/12,Chest x ray 8/13, last ICD interrogation 8/13 EPIC  . Atrial fibrillation or flutter     maintaining sinus on amiodarone    . Sleep apnea     STOP BANG SCORE 4  . CRI (chronic renal insufficiency)     (baseline 2.0-2.2)/ recent hospitalization 8/13  . 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 Cervical laminectomy   1985  . Back surgery  1972    lower back  . Cardiac defibrillator placement      ICD St Jude; gen change 09-20-13  . Hernia repair  1989    bilateral  . Esophagogastroduodenoscopy    . Colonoscopy    . Coronary angioplasty  [id number]  . Transurethral resection of bladder tumor  05/25/2012    cold cup biopsy prostate  . Cystoscopy w/ retrogrades  05/25/2012    Procedure: CYSTOSCOPY WITH RETROGRADE PYELOGRAM;  Surgeon: Molli Hazard, MD;  Location: [facility];  Service: Urology;  Laterality: Bilateral;  . Transurethral resection of bladder tumor  07/08/2012    Procedure: TRANSURETHRAL RESECTION OF BLADDER TUMOR (TURBT);  Surgeon: Molli Hazard, MD;  Location: [facility];  Service: Urology;  Laterality: N/A;  CYSTO, TURBT W/ GYRUS, BILATERAL STENT REMOVAL, LEFT URETEROSCOPY WITH BIOPSY, POSSIBLE LEFT STENT PLACEMENT   . Cystoscopy w/ ureteral stent removal  07/08/2012    Procedure: CYSTOSCOPY WITH STENT REMOVAL;  Surgeon: Molli Hazard, MD;  Location: [facility];  Service: Urology;  Laterality: Bilateral;  . Cystoscopy w/ ureteral stent placement  07/08/2012    Procedure: CYSTOSCOPY WITH STENT REPLACEMENT;  Surgeon:  Molli Hazard, MD;  Location: [facility];  Service: Urology;  Laterality: Left;  . Cystoscopy/retrograde/ureteroscopy  07/08/2012    Procedure: CYSTOSCOPY/RETROGRADE/URETEROSCOPY;  Surgeon: Molli Hazard, MD;  Location: [facility];  Service: Urology;  Laterality: Left;  . Implantable cardioverter defibrillator (icd) generator change N/A 09/20/2013    Procedure: ICD GENERATOR CHANGE;  Surgeon: Deboraha Sprang, MD;  Location[facility] CATH LAB;  Service: Cardiovascular;  Laterality: N/A;    Current Outpatient Prescriptions  Medication Sig Dispense Refill  . acetaminophen (TYLENOL) 325 MG tablet Take 2 tablets (650 mg total) by mouth every 6 (six) hours as needed for mild pain (or Fever >/= 101).    Marland Kitchen amiodarone (PACERONE) 100 MG tablet Take 1 tablet (100 mg total) by mouth  daily.    . carvedilol (COREG) 6.25 MG tablet TAKE ONE TABLET BY MOUTH TWICE DAILY WITH MEALS 60 tablet 3  . ELIQUIS 2.5 MG TABS tablet TAKE ONE TABLET BY MOUTH TWICE DAILY 60 tablet 3  . ezetimibe (ZETIA) 10 MG tablet TAKE ONE TABLET BY MOUTH EVERY DAY WITH  SUPPER 90 tablet 3  . ferrous sulfate ([employer] IRON) 325 (65 FE) MG tablet Take 325 mg by mouth every Monday, Wednesday, and Friday.     . furosemide (LASIX) 20 MG tablet Take 20 mg by mouth daily.     . hydrOXYzine (ATARAX/VISTARIL) 25 MG tablet Take 1 tablet by mouth every 4 (four) hours as needed for itching. Reported on 10/19/2015    . KLOR-CON M10 10 MEQ tablet Take 10 mEq by mouth daily.     Marland Kitchen levothyroxine (SYNTHROID, LEVOTHROID) 50 MCG tablet Take 25 mcg by mouth daily before breakfast.     . Magnesium Hydroxide (MILK OF MAGNESIA PO) Take 20 mLs by mouth daily as needed (for constipation).    . Multiple Vitamins-Minerals (CENTRUM SILVER PO) Take 0.5 tablets by mouth 2 (two) times daily.    Marland Kitchen NITROSTAT 0.4 MG SL tablet PLACE ONE TABLET UNDER THE TONGUE EVERY 5 MINUTES AS NEEDED FOR CHEST PAIN 25 tablet 0  . pantoprazole (PROTONIX) 40 MG tablet Take 40 mg by mouth daily.     . Pumpkin Seed-Soy Germ (AZO BLADDER CONTROL/GO-LESS) CAPS Take 1 capsule by mouth as needed (for bladder incontinence).     . rosuvastatin (CRESTOR) 20 MG tablet Take 1 tablet (20 mg total) by mouth daily. 90 tablet 3  . sodium chloride (OCEAN) 0.65 % SOLN nasal spray Place 1 spray into both nostrils as needed for congestion.    . tamsulosin (FLOMAX) 0.4 MG CAPS capsule Take 1 capsule by mouth at bedtime.    . valsartan (DIOVAN) 80 MG tablet TAKE ONE-HALF TABLET BY MOUTH ONCE DAILY ONCE IF SYSTOLIC BLOOD PRESSURE IS GREATER THAN 100 30 tablet 3   No current facility-administered medications for this visit.    Allergies  Allergen Reactions  . Ramipril Other (See Comments)    cough  . Amoxicillin Rash  . Penicillins Rash    Review of Systems negative except from  HPI and PMH  Physical Exam BP 112/58 mmHg  Pulse 60  Ht 5\' 10"  (1.778 m)  Wt 192 lb (87.091 kg)  BMI 27.55 kg/m2  SpO2 98% Well developed and well nourished in no acute distress HENT normal E scleral and icterus clear Neck Supple JVP flat; carotids brisk and full Clear to ausculation *Regular rate and rhythm, no murmurs gallops or rub Soft with active bowel sounds No clubbing cyanosis 1+ Edema Alert and oriented, grossly normal  motor and sensory function Skin Warm and Dry  ECG  Assessment and  Plan  Ischemic cardiomyopathy]  CRT-d  Left ventricular lead failure to capture  Atrial fibrillation   Heart failure-chronic systolic  Renal insufficiency grade 3-4  His device has detected significant atrial fibrillation.  His CHADS-VASc score is 5 (age-25 heart failure-1 hypertension-1 vascular disease-1)   He is currently taking low-dose apixaban and tolerated well. Euvolemic continue current meds  Without symptoms of ischemia  No intercurrent atrial fibrillation or flutter

## 2016-05-09 DIAGNOSIS — C44319 Basal cell carcinoma of skin of other parts of face: Secondary | ICD-10-CM | POA: Diagnosis not present

## 2016-05-09 DIAGNOSIS — Z85828 Personal history of other malignant neoplasm of skin: Secondary | ICD-10-CM | POA: Diagnosis not present

## 2016-05-10 ENCOUNTER — Encounter (HOSPITAL_COMMUNITY): Payer: Medicare Other | Admitting: Internal Medicine

## 2016-05-15 ENCOUNTER — Other Ambulatory Visit (HOSPITAL_COMMUNITY): Payer: Self-pay | Admitting: Internal Medicine

## 2016-05-27 ENCOUNTER — Ambulatory Visit (HOSPITAL_COMMUNITY)
Admission: RE | Admit: 2016-05-27 | Discharge: 2016-05-27 | Disposition: A | Discharge status: Home / Self Care | Payer: Medicare Other | Source: Ambulatory Visit | Attending: Internal Medicine | Admitting: Internal Medicine

## 2016-05-27 VITALS — BP 108/54 | HR 84 | Wt 191.5 lb

## 2016-05-27 DIAGNOSIS — I130 Hypertensive heart and chronic kidney disease with heart failure and stage 1 through stage 4 chronic kidney disease, or unspecified chronic kidney disease: Secondary | ICD-10-CM | POA: Insufficient documentation

## 2016-05-27 DIAGNOSIS — E785 Hyperlipidemia, unspecified: Secondary | ICD-10-CM | POA: Insufficient documentation

## 2016-05-27 DIAGNOSIS — E039 Hypothyroidism, unspecified: Secondary | ICD-10-CM | POA: Insufficient documentation

## 2016-05-27 DIAGNOSIS — I5022 Chronic systolic (congestive) heart failure: Secondary | ICD-10-CM | POA: Diagnosis not present

## 2016-05-27 DIAGNOSIS — I480 Paroxysmal atrial fibrillation: Secondary | ICD-10-CM | POA: Insufficient documentation

## 2016-05-27 DIAGNOSIS — Z7901 Long term (current) use of anticoagulants: Secondary | ICD-10-CM | POA: Diagnosis not present

## 2016-05-27 DIAGNOSIS — Z79899 Other long term (current) drug therapy: Secondary | ICD-10-CM | POA: Diagnosis not present

## 2016-05-27 DIAGNOSIS — N184 Chronic kidney disease, stage 4 (severe): Secondary | ICD-10-CM | POA: Insufficient documentation

## 2016-05-27 DIAGNOSIS — I252 Old myocardial infarction: Secondary | ICD-10-CM | POA: Diagnosis not present

## 2016-05-27 DIAGNOSIS — Z8551 Personal history of malignant neoplasm of bladder: Secondary | ICD-10-CM | POA: Insufficient documentation

## 2016-05-27 DIAGNOSIS — G4730 Sleep apnea, unspecified: Secondary | ICD-10-CM | POA: Insufficient documentation

## 2016-05-27 DIAGNOSIS — Z955 Presence of coronary angioplasty implant and graft: Secondary | ICD-10-CM | POA: Diagnosis not present

## 2016-05-27 DIAGNOSIS — Z85828 Personal history of other malignant neoplasm of skin: Secondary | ICD-10-CM | POA: Diagnosis not present

## 2016-05-27 DIAGNOSIS — Z95810 Presence of automatic (implantable) cardiac defibrillator: Secondary | ICD-10-CM | POA: Diagnosis not present

## 2016-05-27 DIAGNOSIS — I2510 Atherosclerotic heart disease of native coronary artery without angina pectoris: Secondary | ICD-10-CM | POA: Insufficient documentation

## 2016-05-27 DIAGNOSIS — K219 Gastro-esophageal reflux disease without esophagitis: Secondary | ICD-10-CM | POA: Insufficient documentation

## 2016-05-27 DIAGNOSIS — G250 Essential tremor: Secondary | ICD-10-CM | POA: Insufficient documentation

## 2016-05-27 NOTE — Progress Notes (Signed)
Advanced Heart Failure Medication Review by a Pharmacist  Does the patient  feel that his/her medications are working for him/her?  yes  Has the patient been experiencing any side effects to the medications prescribed?  no  Does the patient measure his/her own blood pressure or blood glucose at home?  yes   Does the patient have any problems obtaining medications due to transportation or finances?   no  Understanding of regimen: good Understanding of indications: good Potential of compliance: good Patient understands to avoid NSAIDs. Patient understands to avoid decongestants.  Issues to address at subsequent visits: None   Pharmacist comments:  [name] is a pleasant 82 yo M presenting with his wife and a current medication list. He reports good compliance with his regimen and did not have any specific medication-related questions or concerns for me at this time.   [name]. Velva Harman, PharmD, BCPS, CPP Clinical Pharmacist Pager: [phone] Phone: [phone] 05/27/2016 10:53 AM      Time with patient: 10 minutes Preparation and documentation time: 2 minutes Total time: 12 minutes

## 2016-05-27 NOTE — Patient Instructions (Signed)
You have been referred to Cardiac Rehab, they will contact you to schedule, may take a couple of weeks to hear from them  We will contact you in 6 months to schedule your next appointment.

## 2016-05-27 NOTE — Progress Notes (Signed)
Patient ID: [name], male   DOB: 04-Sep-1932, 82 y.o.   MRN: [id number]    Advanced Heart Failure Clinic Note   PCP: [name]  HPI: [name] is delightful 82 year old male with a history of coronary artery disease, status post previous large anterior wall myocardial infarction s/p multivessel stenting, systolic CHF with EF 123456 s/p St. Jude BiV ICD.  Remainder of his medical history is notable for atrial fibrillation,maintaining sinus rhythm on amiodarone.  He is not on Coumadin secondary to GI bleed, chronic renal insufficiency with baseline creatinine about 2.2-2.5, hypertension, hyperlipidemia, and a systemic tremor.Bladder cancer 05/2012. Completed BCG treatment.   2015 found that his left ventricular lead has not been capturing suggestive of either fracture of the proximal electrode or an issue at the header. They reprogrammed the device to use to the RV coil which he tolerated well and had a good threshold.    He returns today for regular follow up. Says he feels fine but doesn't have much stamina. Doing all his activities without problem but hads to take frequent res breaks.  Denies CP or SOB. Has not had any edema. Taking his lasix 20 mg daily and has not needed any extra. Weight stable within a few lbs at home. Has finished treatment for his bladder CA,.   Echo 12/14 LVEF 20-25% Carotid u/s 3/14: 40-59% R 0-39%%L Echo today shows: EF 25%   Labs  12/20/13: K 4.1 Cr 2.7 Hgb 11.9  06/07/14: K 4.5 Cr 2.0 03/2015 K 4.6, Cr 2.7   ROS: All systems negative except as listed in HPI, PMH and Problem List.  Past Medical History:  Diagnosis Date  . Anemia   . Atrial fibrillation or flutter    maintaining sinus on amiodarone    . Bladder cancer ([name]) dx'd 05/2012  . CAD (coronary artery disease)     a. s/p anterior MI 12/05 c/b shock -> stent LAD   b. s/p stenting OM-1, 2/06  . CHF (congestive heart failure) (HCC)    due to ischemic CM  a. EF 20-30%. (Nov 2008)   b. s/p St. Jude BiV-ICD     c. CPX 07/2008  pvo2 16.3 (63% predicted) slope 34 RER 1.08 O2 pulse 93%  . Cough    occasional /productive, no fever/ not new  . CRI (chronic renal insufficiency)    (baseline 2.0-2.2)/ recent hospitalization 8/13  . GERD (gastroesophageal reflux disease)    hx Barretts esophagitis  . History of blood transfusion    following coumadin  usage  . HTN (hypertension)    EKG 05/14/12,Chest x ray 8/13, last ICD interrogation 8/13 EPIC  . Hyperlipidemia   . Hypothyroidism   . 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 Neuromuscular disorder ([name])    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"   no neurologist  . Skin cancer    basal cells   facial x 4, 1 right forearm  . Sleep apnea    STOP BANG SCORE 4    Current Outpatient Prescriptions  Medication Sig Dispense Refill  . acetaminophen (TYLENOL) 325 MG tablet Take 2 tablets (650 mg total) by mouth every 6 (six) hours as needed for mild pain (or Fever >/= 101).    Marland Kitchen amiodarone (PACERONE) 100 MG tablet Take 1 tablet (100 mg total) by mouth daily.    . carvedilol (COREG) 6.25 MG tablet TAKE ONE TABLET BY MOUTH TWICE DAILY WITH MEALS 60 tablet 0  . diphenhydramine-acetaminophen (TYLENOL PM) 25-500  MG TABS tablet Take 1 tablet by mouth at bedtime as needed (sleep).    Marland Kitchen ELIQUIS 2.5 MG TABS tablet TAKE ONE TABLET BY MOUTH TWICE DAILY 60 tablet 0  . ezetimibe (ZETIA) 10 MG tablet TAKE ONE TABLET BY MOUTH EVERY DAY WITH  SUPPER 90 tablet 3  . ferrous sulfate ([facility]S IRON) 325 (65 FE) MG tablet Take 325 mg by mouth every Monday, Wednesday, and Friday.     . furosemide (LASIX) 20 MG tablet Take 20 mg by mouth daily.     . hydrOXYzine (ATARAX/VISTARIL) 25 MG tablet Take 1 tablet by mouth every 4 (four) hours as needed for itching. Reported on 10/19/2015    . KLOR-CON M10 10 MEQ tablet Take 10 mEq by mouth daily.     Marland Kitchen levothyroxine (SYNTHROID, LEVOTHROID) 50 MCG tablet Take 25-50 mcg by mouth daily before breakfast. Take 50 mcg (1  tablet) on Tues, Thurs, Sat, Sun and 25 mcg (1/2 tablet) on Mon, Wed, Fri    . Magnesium Hydroxide (MILK OF MAGNESIA PO) Take 20 mLs by mouth daily as needed (for constipation).    . Multiple Vitamins-Minerals (CENTRUM SILVER PO) Take 0.5 tablets by mouth 2 (two) times daily.    . pantoprazole (PROTONIX) 40 MG tablet Take 40 mg by mouth daily.     . Pumpkin Seed-Soy Germ (AZO BLADDER CONTROL/GO-LESS) CAPS Take 1 capsule by mouth as needed (for bladder incontinence).     . rosuvastatin (CRESTOR) 20 MG tablet Take 1 tablet (20 mg total) by mouth daily. 90 tablet 3  . sodium chloride (OCEAN) 0.65 % SOLN nasal spray Place 1 spray into both nostrils as needed for congestion.    . tamsulosin (FLOMAX) 0.4 MG CAPS capsule Take 1 capsule by mouth at bedtime.    . valsartan (DIOVAN) 80 MG tablet TAKE ONE-HALF TABLET BY MOUTH ONCE DAILY ONCE IF SYSTOLIC BLOOD PRESSURE IS GREATER THAN 100 30 tablet 3  . NITROSTAT 0.4 MG SL tablet PLACE ONE TABLET UNDER THE TONGUE EVERY 5 MINUTES AS NEEDED FOR CHEST PAIN (Patient not taking: Reported on 05/27/2016) 25 tablet 0   No current facility-administered medications for this encounter.      PHYSICAL EXAM: Vitals:   05/27/16 1043  BP: (!) 108/54  Pulse: 84  SpO2: 96%  Weight: 191 lb 8 oz (86.9 kg)    General:  Elderly. NAD HEENT: normal Neck: supple. JVP 6 cm with very mild HJR.  Carotids 2+ bilat; no bruits. Cor: PMI laterally displaced. RRR, No rubs, gallops, 2/6 SEM murmur at apex. ICD site ok Lungs: CTAB, normal effort.  Abdomen: soft, NT, mildly distended, no HSM. No bruits or masses. +BS  Extremities: no cyanosis, clubbing, rash. No edema. +varicose veins.  Neuro: alert & orientedx3, cranial nerves grossly intact. moves all 4 extremities w/o difficulty. affect pleasant +diffuse tremor   ASSESSMENT & PLAN: 1. Chronic systolic CHF. ECHO stable 1/17 EF 25%    -- Stable with NYHA II-III symptoms. Volume status stable.     --Will check Corevue     --Refer to Cardiac rehab    -- Continue lasix 20 mg daily.     -- He has not been able to tolerate uptitration of Coreg or valsartan, Continue Coreg 6.25 BID and Valsartan 40 mg daily if BP > 100.  2. CAD     -- Denies CP. Off ASA due to Eliquis.      -- Continue statin. Lipids followed by Dr. Virgina [name].  3. PAF       --  In NSR. Continue amio 100 daily.       --No bleeding on Eliquis 2.5 bid 4. Carotid ultrasound       -- 1-39% bilaterally on last scan. No repeat warranted. 5. CKD, Stage IV       -- Sees Dr. Joelyn [name] at [facility] 6. Essential Tremors       -- Followed by PCP. Had a neurologic work up and negative for [employer].     Follow up in 6 months, or prn for symptoms.   Bensimhon, Daniel,MD 11:02 AM

## 2016-05-27 NOTE — Addendum Note (Signed)
Encounter addended by: Scarlette Calico, RN on: 05/27/2016 11:23 AM<BR>    Actions taken: Order Entry activity accessed, Diagnosis association updated, Sign clinical note

## 2016-06-05 ENCOUNTER — Encounter: Payer: Self-pay | Admitting: Internal Medicine

## 2016-06-10 DIAGNOSIS — Z23 Encounter for immunization: Secondary | ICD-10-CM | POA: Diagnosis not present

## 2016-06-19 ENCOUNTER — Other Ambulatory Visit (HOSPITAL_COMMUNITY): Payer: Self-pay | Admitting: Internal Medicine

## 2016-06-24 DIAGNOSIS — R3915 Urgency of urination: Secondary | ICD-10-CM | POA: Diagnosis not present

## 2016-06-24 DIAGNOSIS — N184 Chronic kidney disease, stage 4 (severe): Secondary | ICD-10-CM | POA: Diagnosis not present

## 2016-06-24 DIAGNOSIS — N401 Enlarged prostate with lower urinary tract symptoms: Secondary | ICD-10-CM | POA: Diagnosis not present

## 2016-06-24 DIAGNOSIS — C670 Malignant neoplasm of trigone of bladder: Secondary | ICD-10-CM | POA: Diagnosis not present

## 2016-07-24 ENCOUNTER — Ambulatory Visit (INDEPENDENT_AMBULATORY_CARE_PROVIDER_SITE_OTHER): Payer: Medicare Other | Admitting: *Deleted

## 2016-07-24 DIAGNOSIS — I255 Ischemic cardiomyopathy: Secondary | ICD-10-CM | POA: Diagnosis not present

## 2016-07-24 NOTE — Progress Notes (Signed)
Remote ICD transmission.   

## 2016-07-25 ENCOUNTER — Encounter: Payer: Self-pay | Admitting: Cardiology

## 2016-07-30 DIAGNOSIS — R8299 Other abnormal findings in urine: Secondary | ICD-10-CM | POA: Diagnosis not present

## 2016-07-30 DIAGNOSIS — Z125 Encounter for screening for malignant neoplasm of prostate: Secondary | ICD-10-CM | POA: Diagnosis not present

## 2016-07-30 DIAGNOSIS — N390 Urinary tract infection, site not specified: Secondary | ICD-10-CM | POA: Diagnosis not present

## 2016-07-30 DIAGNOSIS — R7309 Other abnormal glucose: Secondary | ICD-10-CM | POA: Diagnosis not present

## 2016-07-30 DIAGNOSIS — E038 Other specified hypothyroidism: Secondary | ICD-10-CM | POA: Diagnosis not present

## 2016-07-30 DIAGNOSIS — I10 Essential (primary) hypertension: Secondary | ICD-10-CM | POA: Diagnosis not present

## 2016-07-30 DIAGNOSIS — I480 Paroxysmal atrial fibrillation: Secondary | ICD-10-CM | POA: Diagnosis not present

## 2016-07-30 DIAGNOSIS — E784 Other hyperlipidemia: Secondary | ICD-10-CM | POA: Diagnosis not present

## 2016-08-01 ENCOUNTER — Encounter (HOSPITAL_COMMUNITY)
Admission: RE | Admit: 2016-08-01 | Discharge: 2016-08-01 | Disposition: A | Discharge status: Home / Self Care | Payer: Medicare Other | Source: Ambulatory Visit | Attending: Internal Medicine | Admitting: Internal Medicine

## 2016-08-01 ENCOUNTER — Encounter (HOSPITAL_COMMUNITY): Payer: Self-pay

## 2016-08-01 VITALS — BP 119/75 | HR 65 | Ht 70.0 in | Wt 182.3 lb

## 2016-08-01 DIAGNOSIS — I5022 Chronic systolic (congestive) heart failure: Secondary | ICD-10-CM | POA: Insufficient documentation

## 2016-08-01 DIAGNOSIS — I2510 Atherosclerotic heart disease of native coronary artery without angina pectoris: Secondary | ICD-10-CM | POA: Insufficient documentation

## 2016-08-01 DIAGNOSIS — I4891 Unspecified atrial fibrillation: Secondary | ICD-10-CM | POA: Diagnosis not present

## 2016-08-01 DIAGNOSIS — E785 Hyperlipidemia, unspecified: Secondary | ICD-10-CM | POA: Diagnosis not present

## 2016-08-01 DIAGNOSIS — I110 Hypertensive heart disease with heart failure: Secondary | ICD-10-CM | POA: Diagnosis not present

## 2016-08-01 NOTE — Progress Notes (Signed)
Cardiac Rehab Medication Review by a Pharmacist  Does the patient  feel that his/her medications are working for him/her?  yes  Has the patient been experiencing any side effects to the medications prescribed?  no  Does the patient measure his/her own blood pressure or blood glucose at home?  yes   Does the patient have any problems obtaining medications due to transportation or finances?   no  Understanding of regimen: fair Understanding of indications: fair Potential of compliance: excellent    Pharmacist comments: Pt presents for initial cardiac rehab appointment today. Of note, patient's wife handles all of his medicines. Pt monitors blood pressure every day and reports no side effects. No other issues noted.  Arrie Senate, PharmD PGY-1 Pharmacy Resident Pager: [phone] 08/01/2016

## 2016-08-01 NOTE — Progress Notes (Signed)
Cardiac Individual Treatment Plan  Patient Details  Name: [name] MRN: [id number] Date of Birth: Sep 13, 1932 Referring Provider:   Flowsheet Row CARDIAC REHAB PHASE II ORIENTATION from 08/01/2016 in [place]  Referring Provider  [name] Bickers MD      Initial Encounter Date:  [name] PHASE II ORIENTATION from 08/01/2016 in [place]  Date  08/01/16  Referring Provider  [name] Bickers MD      Visit Diagnosis: 7/42/59, Systolic CHF, chronic ([place])  Patient's Home Medications on Admission:  Current Outpatient Prescriptions:  .  acetaminophen (TYLENOL) 325 MG tablet, Take 2 tablets (650 mg total) by mouth every 6 (six) hours as needed for mild pain (or Fever >/= 101)., Disp: , Rfl:  .  amiodarone (PACERONE) 100 MG tablet, Take 1 tablet (100 mg total) by mouth daily. (Patient taking differently: Take 50 mg by mouth daily. ), Disp: , Rfl:  .  carvedilol (COREG) 6.25 MG tablet, TAKE ONE TABLET BY MOUTH TWICE DAILY WITH MEALS, Disp: 60 tablet, Rfl: 3 .  diphenhydramine-acetaminophen (TYLENOL PM) 25-500 MG TABS tablet, Take 1 tablet by mouth at bedtime as needed (sleep)., Disp: , Rfl:  .  ELIQUIS 2.5 MG TABS tablet, TAKE ONE TABLET BY MOUTH TWICE DAILY, Disp: 60 tablet, Rfl: 3 .  ezetimibe (ZETIA) 10 MG tablet, TAKE ONE TABLET BY MOUTH EVERY DAY WITH  SUPPER, Disp: 90 tablet, Rfl: 3 .  ferrous sulfate ([employer] IRON) 325 (65 FE) MG tablet, Take 325 mg by mouth every Monday, Wednesday, and Friday. , Disp: , Rfl:  .  furosemide (LASIX) 20 MG tablet, Take 20 mg by mouth daily. , Disp: , Rfl:  .  hydrOXYzine (ATARAX/VISTARIL) 25 MG tablet, Take 1 tablet by mouth every 4 (four) hours as needed for itching. Reported on 10/19/2015, Disp: , Rfl:  .  KLOR-CON M10 10 MEQ tablet, Take 10 mEq by mouth daily. , Disp: , Rfl:  .  levothyroxine (SYNTHROID, LEVOTHROID) 50 MCG tablet, Take 25-50 mcg by mouth daily  before breakfast. Take 50 mcg (1 tablet) on Tues, Thurs, Sat, Sun and 25 mcg (1/2 tablet) on Mon, Wed, Fri, Disp: , Rfl:  .  Magnesium Hydroxide (MILK OF MAGNESIA PO), Take 20 mLs by mouth daily as needed (for constipation)., Disp: , Rfl:  .  Multiple Vitamins-Minerals (CENTRUM SILVER PO), Take 1 tablet by mouth daily. , Disp: , Rfl:  .  NITROSTAT 0.4 MG SL tablet, PLACE ONE TABLET UNDER THE TONGUE EVERY 5 MINUTES AS NEEDED FOR CHEST PAIN, Disp: 25 tablet, Rfl: 0 .  pantoprazole (PROTONIX) 40 MG tablet, Take 40 mg by mouth daily. , Disp: , Rfl:  .  rosuvastatin (CRESTOR) 20 MG tablet, Take 1 tablet (20 mg total) by mouth daily., Disp: 90 tablet, Rfl: 3 .  sodium chloride (OCEAN) 0.65 % SOLN nasal spray, Place 1 spray into both nostrils as needed for congestion., Disp: , Rfl:  .  tamsulosin (FLOMAX) 0.4 MG CAPS capsule, Take 1 capsule by mouth at bedtime., Disp: , Rfl:  .  valsartan (DIOVAN) 80 MG tablet, TAKE ONE-HALF TABLET BY MOUTH ONCE DAILY ONCE IF SYSTOLIC BLOOD PRESSURE IS GREATER THAN 100, Disp: 30 tablet, Rfl: 3 .  Pumpkin Seed-Soy Germ (AZO BLADDER CONTROL/GO-LESS) CAPS, Take 1 capsule by mouth as needed (for bladder incontinence). , Disp: , Rfl:   Past Medical History: Past Medical History:  Diagnosis Date  . Anemia   . Atrial fibrillation or flutter  maintaining sinus on amiodarone    . Bladder cancer ([place]) dx'd 05/2012  . CAD (coronary artery disease)     a. s/p anterior MI 12/05 c/b shock -> stent LAD   b. s/p stenting OM-1, 2/06  . CHF (congestive heart failure) (HCC)    due to ischemic CM  a. EF 20-30%. (Nov 2008)   b. s/p [employer] BiV-ICD    c. CPX 07/2008  pvo2 16.3 (63% predicted) slope 34 RER 1.08 O2 pulse 93%  . Cough    occasional /productive, no fever/ not new  . CRI (chronic renal insufficiency)    (baseline 2.0-2.2)/ recent hospitalization 8/13  . GERD (gastroesophageal reflux disease)    hx Barretts esophagitis  . History of blood transfusion    following  coumadin  usage  . HTN (hypertension)    EKG 05/14/12,Chest x ray 8/13, last ICD interrogation 8/13 EPIC  . Hyperlipidemia   . Hypothyroidism   . 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 Skin cancer    basal cells   facial x 4, 1 right forearm  . Sleep apnea    STOP BANG SCORE 4    Tobacco Use: History  Smoking Status  . Former Smoker  . Types: Cigarettes, Cigars  . Quit date: 03/02/2004  Smokeless Tobacco  . Former Systems developer  . Quit date: 05/20/1999    Comment: quit in 2005    Labs: Recent Review Flowsheet Data    Labs for ITP Cardiac and Pulmonary Rehab Latest Ref Rng & Units 11/13/2006 10/06/2007 11/02/2007 04/25/2008 08/15/2013   Cholestrol 0 - 200 mg/dL 111 - 121 90 -   LDLCALC 0 - 99 mg/dL 66 - 73 47 -   HDL >39.0 mg/dL 30.3(L) - 30.7(L) 29.3(L) -   Trlycerides 0 - 149 mg/dL 75 - 85 67 -   HCO3 - - 26.1(H) - - -   TCO2 0 - 100 mmol/L - 27 - - 22      Capillary Blood Glucose: No results found for: GLUCAP   Exercise Target Goals: Date: 08/01/16  Exercise Program Goal: Individual exercise prescription set with THRR, safety & activity barriers. Participant demonstrates ability to understand and report RPE using BORG scale, to self-measure pulse accurately, and to acknowledge the importance of the exercise prescription.  Exercise Prescription Goal: Starting with aerobic activity 30 plus minutes a day, 3 days per week for initial exercise prescription. Provide home exercise prescription and guidelines that participant acknowledges understanding prior to discharge.  Activity Barriers & Risk Stratification:     Activity Barriers & Cardiac Risk Stratification - 08/01/16 1406      Activity Barriers & Cardiac Risk Stratification   Cardiac Risk Stratification High      6 Minute Walk:     6 Minute Walk    Row Name 08/01/16 1355         6 Minute Walk    Phase Initial     Distance 1363 feet     Walk Time 6 minutes     # of Rest Breaks 0     MPH 2.58     METS 2.3     RPE 9     VO2 Peak 8.22     Symptoms No     Resting HR 65 bpm     Resting BP 119/75     Max Ex. HR  95 bpm     Max Ex. BP 118/60     2 Minute Post BP 116/68        Initial Exercise Prescription:     Initial Exercise Prescription - 08/01/16 1400      Date of Initial Exercise RX and Referring Provider   Date 08/01/16   Referring Provider Bensimhon, Daniel MD     Recumbant Bike   Level 2   Minutes 10   METs 2     NuStep   Level 3   Minutes 10   METs 2     Cybex   Level 2   Minutes 10   METs 2     Track   Laps 9   Minutes 10   METs 2.39     Prescription Details   Frequency (times per week) 3   Duration Progress to 30 minutes of continuous aerobic without signs/symptoms of physical distress     Intensity   THRR 40-80% of Max Heartrate 53-110   Ratings of Perceived Exertion 11-13     Resistance Training   Training Prescription Yes   Weight 2lbs   Reps 10-12      Perform Capillary Blood Glucose checks as needed.  Exercise Prescription Changes:   Exercise Comments:   Discharge Exercise Prescription (Final Exercise Prescription Changes):   Nutrition:  Target Goals: Understanding of nutrition guidelines, daily intake of sodium 1500mg , cholesterol 200mg , calories 30% from fat and 7% or less from saturated fats, daily to have 5 or more servings of fruits and vegetables.  Biometrics:     Pre Biometrics - 08/01/16 1358      Pre Biometrics   Height 5\' 10"  (1.778 m)   Weight 182 lb 5.1 oz (82.7 kg)   Waist Circumference 42 inches   Hip Circumference 42 inches   Waist to Hip Ratio 1 %   BMI (Calculated) 26.2   Triceps Skinfold 23 mm   % Body Fat 30 %   Grip Strength 34 kg   Flexibility 7.5 in   Single Leg Stand 0 seconds       Nutrition Therapy Plan and Nutrition Goals:   Nutrition Discharge: Nutrition  Scores:   Nutrition Goals Re-Evaluation:   Psychosocial: Target Goals: Acknowledge presence or absence of depression, maximize coping skills, provide positive support system. Participant is able to verbalize types and ability to use techniques and skills needed for reducing stress and depression.  Initial Review & Psychosocial Screening:     Initial Psych Review & Screening - 08/01/16 1618      Family Dynamics   Good Support System? Yes  Pt wife accompanied pt to orientation   Comments No psychosocial needs identified, will continue to monitor and re evaluate as needed.     Barriers   Psychosocial barriers to participate in program There are no identifiable barriers or psychosocial needs.      Quality of Life Scores:     Quality of Life - 08/01/16 1400      Quality of Life Scores   Health/Function Pre 22.89 %   Socioeconomic Pre 22 %   Psych/Spiritual Pre 27.43 %   Family Pre 28.8 %   GLOBAL Pre 25.08 %      PHQ-9: Recent Review Flowsheet Data    Depression screen PHQ 2/9 08/01/2016   Decreased Interest 0   Down, Depressed, Hopeless 0   PHQ - 2 Score 0      Psychosocial Evaluation and Intervention:   Psychosocial Re-Evaluation:  Vocational Rehabilitation: Provide vocational rehab assistance to qualifying candidates.   Vocational Rehab Evaluation & Intervention:     Vocational Rehab - 08/01/16 1620      Initial Vocational Rehab Evaluation & Intervention   Assessment shows need for Vocational Rehabilitation No  Pt does not plan to return to work.  Pt is retired.      Education: Education Goals: Education classes will be provided on a weekly basis, covering required topics. Participant will state understanding/return demonstration of topics presented.  Learning Barriers/Preferences:     Learning Barriers/Preferences - 08/01/16 0813      Learning Barriers/Preferences   Learning Barriers Sight   Learning Preferences Video      Education  Topics: Count Your Pulse:  -Group instruction provided by verbal instruction, demonstration, patient participation and written materials to support subject.  Instructors address importance of being able to find your pulse and how to count your pulse when at home without a heart monitor.  Patients get hands on experience counting their pulse with staff help and individually.   Heart Attack, Angina, and Risk Factor Modification:  -Group instruction provided by verbal instruction, video, and written materials to support subject.  Instructors address signs and symptoms of angina and heart attacks.    Also discuss risk factors for heart disease and how to make changes to improve heart health risk factors.   Functional Fitness:  -Group instruction provided by verbal instruction, demonstration, patient participation, and written materials to support subject.  Instructors address safety measures for doing things around the house.  Discuss how to get up and down off the floor, how to pick things up properly, how to safely get out of a chair without assistance, and balance training.   Meditation and Mindfulness:  -Group instruction provided by verbal instruction, patient participation, and written materials to support subject.  Instructor addresses importance of mindfulness and meditation practice to help reduce stress and improve awareness.  Instructor also leads participants through a meditation exercise.    Stretching for Flexibility and Mobility:  -Group instruction provided by verbal instruction, patient participation, and written materials to support subject.  Instructors lead participants through series of stretches that are designed to increase flexibility thus improving mobility.  These stretches are additional exercise for major muscle groups that are typically performed during regular warm up and cool down.   Hands Only CPR Anytime:  -Group instruction provided by verbal instruction, video,  patient participation and written materials to support subject.  Instructors co-teach with AHA video for hands only CPR.  Participants get hands on experience with mannequins.   Nutrition I class: Heart Healthy Eating:  -Group instruction provided by PowerPoint slides, verbal discussion, and written materials to support subject matter. The instructor gives an explanation and review of the Therapeutic Lifestyle Changes diet recommendations, which includes a discussion on lipid goals, dietary fat, sodium, fiber, plant stanol/sterol esters, sugar, and the components of a well-balanced, healthy diet.   Nutrition II class: Lifestyle Skills:  -Group instruction provided by PowerPoint slides, verbal discussion, and written materials to support subject matter. The instructor gives an explanation and review of label reading, grocery shopping for heart health, heart healthy recipe modifications, and ways to make healthier choices when eating out.   Diabetes Question & Answer:  -Group instruction provided by PowerPoint slides, verbal discussion, and written materials to support subject matter. The instructor gives an explanation and review of diabetes co-morbidities, pre- and post-prandial blood glucose goals, pre-exercise blood glucose goals, signs, symptoms, and  treatment of hypoglycemia and hyperglycemia, and foot care basics.   Diabetes Blitz:  -Group instruction provided by PowerPoint slides, verbal discussion, and written materials to support subject matter. The instructor gives an explanation and review of the physiology behind type 1 and type 2 diabetes, diabetes medications and rational behind using different medications, pre- and post-prandial blood glucose recommendations and Hemoglobin A1c goals, diabetes diet, and exercise including blood glucose guidelines for exercising safely.    Portion Distortion:  -Group instruction provided by PowerPoint slides, verbal discussion, written materials, and  food models to support subject matter. The instructor gives an explanation of serving size versus portion size, changes in portions sizes over the last 20 years, and what consists of a serving from each food group.   Stress Management:  -Group instruction provided by verbal instruction, video, and written materials to support subject matter.  Instructors review role of stress in heart disease and how to cope with stress positively.     Exercising on Your Own:  -Group instruction provided by verbal instruction, power point, and written materials to support subject.  Instructors discuss benefits of exercise, components of exercise, frequency and intensity of exercise, and end points for exercise.  Also discuss use of nitroglycerin and activating EMS.  Review options of places to exercise outside of rehab.  Review guidelines for sex with heart disease.   Cardiac Drugs I:  -Group instruction provided by verbal instruction and written materials to support subject.  Instructor reviews cardiac drug classes: antiplatelets, anticoagulants, beta blockers, and statins.  Instructor discusses reasons, side effects, and lifestyle considerations for each drug class.   Cardiac Drugs II:  -Group instruction provided by verbal instruction and written materials to support subject.  Instructor reviews cardiac drug classes: angiotensin converting enzyme inhibitors (ACE-I), angiotensin II receptor blockers (ARBs), nitrates, and calcium channel blockers.  Instructor discusses reasons, side effects, and lifestyle considerations for each drug class.   Anatomy and Physiology of the Circulatory System:  -Group instruction provided by verbal instruction, video, and written materials to support subject.  Reviews functional anatomy of heart, how it relates to various diagnoses, and what role the heart plays in the overall system.   Knowledge Questionnaire Score:     Knowledge Questionnaire Score - 08/01/16 1353       Knowledge Questionnaire Score   Pre Score 18/24      Core Components/Risk Factors/Patient Goals at Admission:     Personal Goals and Risk Factors at Admission - 08/01/16 0822      Core Components/Risk Factors/Patient Goals on Admission   Sedentary Yes   Intervention Provide advice, education, support and counseling about physical activity/exercise needs.;Develop an individualized exercise prescription for aerobic and resistive training based on initial evaluation findings, risk stratification, comorbidities and participant's personal goals.   Expected Outcomes Achievement of increased cardiorespiratory fitness and enhanced flexibility, muscular endurance and strength shown through measurements of functional capacity and personal statement of participant.   Increase Strength and Stamina Yes   Intervention Provide advice, education, support and counseling about physical activity/exercise needs.;Develop an individualized exercise prescription for aerobic and resistive training based on initial evaluation findings, risk stratification, comorbidities and participant's personal goals.   Expected Outcomes Achievement of increased cardiorespiratory fitness and enhanced flexibility, muscular endurance and strength shown through measurements of functional capacity and personal statement of participant.   Improve shortness of breath with ADL's Yes   Intervention Provide education, individualized exercise plan and daily activity instruction to help decrease symptoms of SOB with activities  of daily living.   Expected Outcomes Short Term: Achieves a reduction of symptoms when performing activities of daily living.   Heart Failure Yes   Intervention Provide a combined exercise and nutrition program that is supplemented with education, support and counseling about heart failure. Directed toward relieving symptoms such as shortness of breath, decreased exercise tolerance, and extremity edema.   Expected  Outcomes Improve functional capacity of life   Personal Goal Other Yes   Personal Goal Increase leg strength, and breathing while walking and doing yard work.    Intervention Individualized exercise program   Expected Outcomes increase functional capacity      Core Components/Risk Factors/Patient Goals Review:    Core Components/Risk Factors/Patient Goals at Discharge (Final Review):    ITP Comments:     ITP Comments    Row Name 08/01/16 1410           ITP Comments Dr. Fransico Him, Medical Director          Comments:  Pt in today for cardiac rehab orientation from [phone].  As a part of the orientation appt, pt completes 6 minute walk test.  Pt tolerated well with no complaints. Monitor showed AV paced. Pt did not need any rest breaks during the walk test.  Pt is accompanied by his wife. Pt completed cardiac rehab 11-12 years ago and is looking forward to full exercise on next week. Cherre Huger, BSN

## 2016-08-05 ENCOUNTER — Encounter (HOSPITAL_COMMUNITY)
Admission: RE | Admit: 2016-08-05 | Discharge: 2016-08-05 | Disposition: A | Discharge status: Home / Self Care | Payer: Medicare Other | Source: Ambulatory Visit | Attending: Internal Medicine | Admitting: Internal Medicine

## 2016-08-05 DIAGNOSIS — I5022 Chronic systolic (congestive) heart failure: Secondary | ICD-10-CM | POA: Diagnosis not present

## 2016-08-05 DIAGNOSIS — E785 Hyperlipidemia, unspecified: Secondary | ICD-10-CM | POA: Diagnosis not present

## 2016-08-05 DIAGNOSIS — I4891 Unspecified atrial fibrillation: Secondary | ICD-10-CM | POA: Diagnosis not present

## 2016-08-05 DIAGNOSIS — I110 Hypertensive heart disease with heart failure: Secondary | ICD-10-CM | POA: Diagnosis not present

## 2016-08-05 DIAGNOSIS — I2510 Atherosclerotic heart disease of native coronary artery without angina pectoris: Secondary | ICD-10-CM | POA: Diagnosis not present

## 2016-08-05 NOTE — Progress Notes (Signed)
Daily Session Note  Patient Details  Name: [name] MRN: [id number] Date of Birth: Mar 13, 1932 Referring Provider:   Flowsheet Row CARDIAC REHAB PHASE II ORIENTATION from 08/01/2016 in [place]  Referring Provider  Glori Bickers MD      Encounter Date: 08/05/2016  Check In:     Session Check In - 08/05/16 1142      Check-In   Location [facility]   Staff Present Cleda Mccreedy, MS, Exercise Physiologist;Alvah Lagrow Wilber Oliphant, RN, Marga Melnick, RN, BSN;Molly diVincenzo, MS, ACSM RCEP, Exercise Physiologist;Olinty Celesta Aver, MS, ACSM CEP, Exercise Physiologist   Supervising physician immediately available to respond to emergencies [facility] immediately available   Physician(s) Dr. Rockne Menghini   Medication changes reported     No   Fall or balance concerns reported    No   Warm-up and Cool-down Performed as group-led instruction   Resistance Training Performed Yes   VAD Patient? No     Pain Assessment   Currently in Pain? No/denies   Multiple Pain Sites No      Capillary Blood Glucose: No results found for this or any previous visit (from the past 24 hour(s)).   Goals Met:  Exercise tolerated well Personal goals reviewed  Goals Unmet:  Not Applicable  Comments:  Pt started full exercise at phase II cardiac rehab today.  Pt tolerated light exercise without difficulty. VSS, telemetry- AV Paced, asymptomatic.  Medication list reconciled. Pt denies barriers to medication compliance.  PSYCHOSOCIAL ASSESSMENT:  PHQ-0. Pt exhibits positive coping skills, hopeful outlook with supportive family. No psychosocial needs identified at this time, no psychosocial interventions necessary. Pt wife accompanies pt to his exercise appt.    Pt enjoys yard work, being outside at [employer]. Pt desires to increase leg strength, increase breathing capacity.  Pt would like to be able to walk with no fatigue and shortness of breath.   This will help increase his functional capacity.  Pt presently upon advice by the rehab staff take a wheelchair to our dept from the main entrance.  Will make ambulating to the dept a goal for pt and to measure his achievement of the above goal.  Pt is in agreement of this.  Pt oriented to exercise equipment and routine. Maurice Small RN, BSN  Understanding verbalized.    Dr. Fransico Him is Medical Director for Cardiac Rehab at [facility].

## 2016-08-07 ENCOUNTER — Encounter (HOSPITAL_COMMUNITY)
Admission: RE | Admit: 2016-08-07 | Discharge: 2016-08-07 | Disposition: A | Discharge status: Home / Self Care | Payer: Medicare Other | Source: Ambulatory Visit | Attending: Internal Medicine | Admitting: Internal Medicine

## 2016-08-07 DIAGNOSIS — I5022 Chronic systolic (congestive) heart failure: Secondary | ICD-10-CM

## 2016-08-07 DIAGNOSIS — I4891 Unspecified atrial fibrillation: Secondary | ICD-10-CM | POA: Diagnosis not present

## 2016-08-07 DIAGNOSIS — I2510 Atherosclerotic heart disease of native coronary artery without angina pectoris: Secondary | ICD-10-CM | POA: Diagnosis not present

## 2016-08-07 DIAGNOSIS — E785 Hyperlipidemia, unspecified: Secondary | ICD-10-CM | POA: Diagnosis not present

## 2016-08-07 DIAGNOSIS — I110 Hypertensive heart disease with heart failure: Secondary | ICD-10-CM | POA: Diagnosis not present

## 2016-08-09 ENCOUNTER — Telehealth (HOSPITAL_COMMUNITY): Payer: Self-pay | Admitting: Internal Medicine

## 2016-08-09 ENCOUNTER — Encounter (HOSPITAL_COMMUNITY): Payer: Medicare Other

## 2016-08-12 ENCOUNTER — Encounter (HOSPITAL_COMMUNITY): Payer: Medicare Other

## 2016-08-14 ENCOUNTER — Encounter (HOSPITAL_COMMUNITY)
Admission: RE | Admit: 2016-08-14 | Discharge: 2016-08-14 | Disposition: A | Discharge status: Home / Self Care | Payer: Medicare Other | Source: Ambulatory Visit | Attending: Internal Medicine | Admitting: Internal Medicine

## 2016-08-14 DIAGNOSIS — I5022 Chronic systolic (congestive) heart failure: Secondary | ICD-10-CM | POA: Diagnosis not present

## 2016-08-14 DIAGNOSIS — E785 Hyperlipidemia, unspecified: Secondary | ICD-10-CM | POA: Insufficient documentation

## 2016-08-14 DIAGNOSIS — I110 Hypertensive heart disease with heart failure: Secondary | ICD-10-CM | POA: Insufficient documentation

## 2016-08-14 DIAGNOSIS — I2510 Atherosclerotic heart disease of native coronary artery without angina pectoris: Secondary | ICD-10-CM | POA: Insufficient documentation

## 2016-08-14 DIAGNOSIS — I4891 Unspecified atrial fibrillation: Secondary | ICD-10-CM | POA: Insufficient documentation

## 2016-08-15 NOTE — Progress Notes (Signed)
Psychosocial Assessment QUALITY OF LIFE SCORE REVIEW  Pt completed Quality of Life survey as a participant in Cardiac Rehab. Scores 21.0 or below are considered low. Pt scored the following     Quality of Life - 08/01/16 1400      Quality of Life Scores   Health/Function Pre 22.89 %   Socioeconomic Pre 22 %   Psych/Spiritual Pre 27.43 %   Family Pre 28.8 %   GLOBAL Pre 25.08 %    Pt scored well above the low threshold.  Pt denies any complaints or needs at this time.  Pt demonstrates positive and healthy outlook on life with supportive family. Pt wife accompanies him to rehab and is very supportive of his rehab efforts.  Will continue to monitor and intervene as necessary. Carlette Wilber Oliphant RN, BSN;         Offered emotional support and reassurance.  Will continue to monitor and intervene as necessary.

## 2016-08-16 ENCOUNTER — Encounter (HOSPITAL_COMMUNITY): Payer: Medicare Other

## 2016-08-16 DIAGNOSIS — Z6828 Body mass index (BMI) 28.0-28.9, adult: Secondary | ICD-10-CM | POA: Diagnosis not present

## 2016-08-16 DIAGNOSIS — Z0000 Encounter for general adult medical examination without abnormal findings: Secondary | ICD-10-CM | POA: Diagnosis not present

## 2016-08-16 DIAGNOSIS — I255 Ischemic cardiomyopathy: Secondary | ICD-10-CM | POA: Diagnosis not present

## 2016-08-16 DIAGNOSIS — N184 Chronic kidney disease, stage 4 (severe): Secondary | ICD-10-CM | POA: Diagnosis not present

## 2016-08-16 DIAGNOSIS — I739 Peripheral vascular disease, unspecified: Secondary | ICD-10-CM | POA: Diagnosis not present

## 2016-08-16 DIAGNOSIS — I509 Heart failure, unspecified: Secondary | ICD-10-CM | POA: Diagnosis not present

## 2016-08-16 DIAGNOSIS — F325 Major depressive disorder, single episode, in full remission: Secondary | ICD-10-CM | POA: Insufficient documentation

## 2016-08-16 DIAGNOSIS — I130 Hypertensive heart and chronic kidney disease with heart failure and stage 1 through stage 4 chronic kidney disease, or unspecified chronic kidney disease: Secondary | ICD-10-CM | POA: Diagnosis not present

## 2016-08-16 DIAGNOSIS — Z950 Presence of cardiac pacemaker: Secondary | ICD-10-CM | POA: Diagnosis not present

## 2016-08-16 DIAGNOSIS — Z1389 Encounter for screening for other disorder: Secondary | ICD-10-CM | POA: Diagnosis not present

## 2016-08-16 DIAGNOSIS — I480 Paroxysmal atrial fibrillation: Secondary | ICD-10-CM | POA: Diagnosis not present

## 2016-08-16 DIAGNOSIS — F329 Major depressive disorder, single episode, unspecified: Secondary | ICD-10-CM | POA: Diagnosis not present

## 2016-08-16 DIAGNOSIS — R627 Adult failure to thrive: Secondary | ICD-10-CM | POA: Diagnosis not present

## 2016-08-16 HISTORY — DX: Major depressive disorder, single episode, in full remission: F32.5

## 2016-08-19 ENCOUNTER — Encounter (HOSPITAL_COMMUNITY)
Admission: RE | Admit: 2016-08-19 | Discharge: 2016-08-19 | Disposition: A | Discharge status: Home / Self Care | Payer: Medicare Other | Source: Ambulatory Visit | Attending: Internal Medicine | Admitting: Internal Medicine

## 2016-08-19 DIAGNOSIS — I110 Hypertensive heart disease with heart failure: Secondary | ICD-10-CM | POA: Diagnosis not present

## 2016-08-19 DIAGNOSIS — I5022 Chronic systolic (congestive) heart failure: Secondary | ICD-10-CM | POA: Diagnosis not present

## 2016-08-19 DIAGNOSIS — E785 Hyperlipidemia, unspecified: Secondary | ICD-10-CM | POA: Diagnosis not present

## 2016-08-19 DIAGNOSIS — Z1212 Encounter for screening for malignant neoplasm of rectum: Secondary | ICD-10-CM | POA: Diagnosis not present

## 2016-08-19 DIAGNOSIS — I4891 Unspecified atrial fibrillation: Secondary | ICD-10-CM | POA: Diagnosis not present

## 2016-08-19 DIAGNOSIS — I2510 Atherosclerotic heart disease of native coronary artery without angina pectoris: Secondary | ICD-10-CM | POA: Diagnosis not present

## 2016-08-20 DIAGNOSIS — D472 Monoclonal gammopathy: Secondary | ICD-10-CM | POA: Diagnosis not present

## 2016-08-20 DIAGNOSIS — N184 Chronic kidney disease, stage 4 (severe): Secondary | ICD-10-CM | POA: Diagnosis not present

## 2016-08-20 DIAGNOSIS — I10 Essential (primary) hypertension: Secondary | ICD-10-CM | POA: Diagnosis not present

## 2016-08-21 ENCOUNTER — Encounter (HOSPITAL_COMMUNITY)
Admission: RE | Admit: 2016-08-21 | Discharge: 2016-08-21 | Disposition: A | Discharge status: Home / Self Care | Payer: Medicare Other | Source: Ambulatory Visit | Attending: Internal Medicine | Admitting: Internal Medicine

## 2016-08-21 DIAGNOSIS — I5022 Chronic systolic (congestive) heart failure: Secondary | ICD-10-CM

## 2016-08-21 DIAGNOSIS — I110 Hypertensive heart disease with heart failure: Secondary | ICD-10-CM | POA: Diagnosis not present

## 2016-08-21 DIAGNOSIS — E785 Hyperlipidemia, unspecified: Secondary | ICD-10-CM | POA: Diagnosis not present

## 2016-08-21 DIAGNOSIS — I2510 Atherosclerotic heart disease of native coronary artery without angina pectoris: Secondary | ICD-10-CM | POA: Diagnosis not present

## 2016-08-21 DIAGNOSIS — I4891 Unspecified atrial fibrillation: Secondary | ICD-10-CM | POA: Diagnosis not present

## 2016-08-22 NOTE — Progress Notes (Signed)
Cardiac Individual Treatment Plan  Patient Details  Name: [name] MRN: [id number] Date of Birth: Dec 05, 1931 Referring Provider:   Flowsheet Row CARDIAC REHAB PHASE II ORIENTATION from 08/01/2016 in [facility]  Referring Provider  Glori Bickers MD      Initial Encounter Date:  [facility] PHASE II ORIENTATION from 08/01/2016 in [facility]  Date  08/01/16  Referring Provider  Glori Bickers MD      Visit Diagnosis: 03/14/08, Systolic CHF, chronic ([place])  Patient's Home Medications on Admission:  Current Outpatient Prescriptions:  .  acetaminophen (TYLENOL) 325 MG tablet, Take 2 tablets (650 mg total) by mouth every 6 (six) hours as needed for mild pain (or Fever >/= 101)., Disp: , Rfl:  .  amiodarone (PACERONE) 100 MG tablet, Take 1 tablet (100 mg total) by mouth daily. (Patient taking differently: Take 50 mg by mouth daily. ), Disp: , Rfl:  .  carvedilol (COREG) 6.25 MG tablet, TAKE ONE TABLET BY MOUTH TWICE DAILY WITH MEALS, Disp: 60 tablet, Rfl: 3 .  diphenhydramine-acetaminophen (TYLENOL PM) 25-500 MG TABS tablet, Take 1 tablet by mouth at bedtime as needed (sleep)., Disp: , Rfl:  .  ELIQUIS 2.5 MG TABS tablet, TAKE ONE TABLET BY MOUTH TWICE DAILY, Disp: 60 tablet, Rfl: 3 .  ezetimibe (ZETIA) 10 MG tablet, TAKE ONE TABLET BY MOUTH EVERY DAY WITH  SUPPER, Disp: 90 tablet, Rfl: 3 .  ferrous sulfate ([employer] IRON) 325 (65 FE) MG tablet, Take 325 mg by mouth every Monday, Wednesday, and Friday. , Disp: , Rfl:  .  furosemide (LASIX) 20 MG tablet, Take 20 mg by mouth daily. , Disp: , Rfl:  .  hydrOXYzine (ATARAX/VISTARIL) 25 MG tablet, Take 1 tablet by mouth every 4 (four) hours as needed for itching. Reported on 10/19/2015, Disp: , Rfl:  .  KLOR-CON M10 10 MEQ tablet, Take 10 mEq by mouth daily. , Disp: , Rfl:  .  levothyroxine (SYNTHROID, LEVOTHROID) 50 MCG tablet, Take 25-50 mcg by mouth daily  before breakfast. Take 50 mcg (1 tablet) on Tues, Thurs, Sat, Sun and 25 mcg (1/2 tablet) on Mon, Wed, Fri, Disp: , Rfl:  .  Magnesium Hydroxide (MILK OF MAGNESIA PO), Take 20 mLs by mouth daily as needed (for constipation)., Disp: , Rfl:  .  Multiple Vitamins-Minerals (CENTRUM SILVER PO), Take 1 tablet by mouth daily. , Disp: , Rfl:  .  NITROSTAT 0.4 MG SL tablet, PLACE ONE TABLET UNDER THE TONGUE EVERY 5 MINUTES AS NEEDED FOR CHEST PAIN, Disp: 25 tablet, Rfl: 0 .  pantoprazole (PROTONIX) 40 MG tablet, Take 40 mg by mouth daily. , Disp: , Rfl:  .  Pumpkin Seed-Soy Germ (AZO BLADDER CONTROL/GO-LESS) CAPS, Take 1 capsule by mouth as needed (for bladder incontinence). , Disp: , Rfl:  .  rosuvastatin (CRESTOR) 20 MG tablet, Take 1 tablet (20 mg total) by mouth daily., Disp: 90 tablet, Rfl: 3 .  sodium chloride (OCEAN) 0.65 % SOLN nasal spray, Place 1 spray into both nostrils as needed for congestion., Disp: , Rfl:  .  tamsulosin (FLOMAX) 0.4 MG CAPS capsule, Take 1 capsule by mouth at bedtime., Disp: , Rfl:  .  valsartan (DIOVAN) 80 MG tablet, TAKE ONE-HALF TABLET BY MOUTH ONCE DAILY ONCE IF SYSTOLIC BLOOD PRESSURE IS GREATER THAN 100, Disp: 30 tablet, Rfl: 3  Past Medical History: Past Medical History:  Diagnosis Date  . Anemia   . Atrial fibrillation or flutter  maintaining sinus on amiodarone    . Bladder cancer ([place]) dx'd 05/2012  . CAD (coronary artery disease)     a. s/p anterior MI 12/05 c/b shock -> stent LAD   b. s/p stenting OM-1, 2/06  . CHF (congestive heart failure) (H[facility])    due to ischemic CM  a. EF 20-30%. (Nov 2008)   b. s/p [employer] BiV-ICD    c. CPX 07/2008  pvo2 16.3 (63% predicted) slope 34 RER 1.08 O2 pulse 93%  . Cough    occasional /productive, no fever/ not new  . CRI (chronic renal insufficiency)    (baseline 2.0-2.2)/ recent hospitalization 8/13  . GERD (gastroesophageal reflux disease)    hx Barretts esophagitis  . History of blood transfusion    following  coumadin  usage  . HTN (hypertension)    EKG 05/14/12,Chest x ray 8/13, last ICD interrogation 8/13 EPIC  . Hyperlipidemia   . Hypothyroidism   . 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 Skin cancer    basal cells   facial x 4, 1 right forearm  . Sleep apnea    STOP BANG SCORE 4    Tobacco Use: History  Smoking Status  . Former Smoker  . Types: Cigarettes, Cigars  . Quit date: 03/02/2004  Smokeless Tobacco  . Former Systems developer  . Quit date: 05/20/1999    Comment: quit in 2005    Labs: Recent Review Flowsheet Data    Labs for ITP Cardiac and Pulmonary Rehab Latest Ref Rng & Units 11/13/2006 10/06/2007 11/02/2007 04/25/2008 08/15/2013   Cholestrol 0 - 200 mg/dL 111 - 121 90 -   LDLCALC 0 - 99 mg/dL 66 - 73 47 -   HDL >39.0 mg/dL 30.3(L) - 30.7(L) 29.3(L) -   Trlycerides 0 - 149 mg/dL 75 - 85 67 -   HCO3 - - 26.1(H) - - -   TCO2 0 - 100 mmol/L - 27 - - 22      Capillary Blood Glucose: No results found for: GLUCAP   Exercise Target Goals:    Exercise Program Goal: Individual exercise prescription set with THRR, safety & activity barriers. Participant demonstrates ability to understand and report RPE using BORG scale, to self-measure pulse accurately, and to acknowledge the importance of the exercise prescription.  Exercise Prescription Goal: Starting with aerobic activity 30 plus minutes a day, 3 days per week for initial exercise prescription. Provide home exercise prescription and guidelines that participant acknowledges understanding prior to discharge.  Activity Barriers & Risk Stratification:     Activity Barriers & Cardiac Risk Stratification - 08/01/16 1406      Activity Barriers & Cardiac Risk Stratification   Cardiac Risk Stratification High      6 Minute Walk:     6 Minute Walk    Row Name 08/01/16 1355         6 Minute Walk   Phase  Initial     Distance 1363 feet     Walk Time 6 minutes     # of Rest Breaks 0     MPH 2.58     METS 2.3     RPE 9     VO2 Peak 8.22     Symptoms No     Resting HR 65 bpm     Resting BP 119/75     Max Ex. HR  95 bpm     Max Ex. BP 118/60     2 Minute Post BP 116/68        Initial Exercise Prescription:     Initial Exercise Prescription - 08/01/16 1400      Date of Initial Exercise RX and Referring Provider   Date 08/01/16   Referring Provider Bensimhon, Daniel MD     Recumbant Bike   Level 2   Minutes 10   METs 2     NuStep   Level 3   Minutes 10   METs 2     Cybex   Level 2   Minutes 10   METs 2     Track   Laps 9   Minutes 10   METs 2.39     Prescription Details   Frequency (times per week) 3   Duration Progress to 30 minutes of continuous aerobic without signs/symptoms of physical distress     Intensity   THRR 40-80% of Max Heartrate 53-110   Ratings of Perceived Exertion 11-13     Resistance Training   Training Prescription Yes   Weight 2lbs   Reps 10-12      Perform Capillary Blood Glucose checks as needed.  Exercise Prescription Changes:     Exercise Prescription Changes    Row Name 08/07/16 1700 08/20/16 1000           Exercise Review   Progression Yes Yes        Response to Exercise   Blood Pressure (Admit) 110/64 106/62      Blood Pressure (Exercise) 122/66 118/60      Blood Pressure (Exit) 102/60 100/68      Heart Rate (Admit) 88 bpm 63 bpm      Heart Rate (Exercise) 96 bpm 97 bpm      Heart Rate (Exit) 76 bpm 60 bpm      Rating of Perceived Exertion (Exercise) 11 11      Symptoms none none      Duration Progress to 30 minutes of continuous aerobic without signs/symptoms of physical distress Progress to 30 minutes of continuous aerobic without signs/symptoms of physical distress      Intensity THRR unchanged THRR unchanged        Progression   Progression Continue to progress workloads to maintain intensity without  signs/symptoms of physical distress. Continue to progress workloads to maintain intensity without signs/symptoms of physical distress.      Average METs 2.2 2.4        Resistance Training   Training Prescription Yes Yes      Weight 2lbs 5lbs      Reps 10-12 10-12        Interval Training   Interval Training No No        Recumbant Bike   Level 2 2      Minutes 10 10      METs - 2.8        NuStep   Level 3 3      Minutes 10 10      METs 2.2 2.1        Cybex   Level -  -      Minutes -  -      METs -  -        Track   Laps 7 10      Minutes 10 10      METs 2.23 2.74  Exercise Comments:     Exercise Comments    Row Name 08/07/16 1704 08/21/16 1303         Exercise Comments Participant off to a good start with exercise. Reviewed METs and goals. Pt is tolerating exercise well; will continue to monitor exercise progression.         Discharge Exercise Prescription (Final Exercise Prescription Changes):     Exercise Prescription Changes - 08/20/16 1000      Exercise Review   Progression Yes     Response to Exercise   Blood Pressure (Admit) 106/62   Blood Pressure (Exercise) 118/60   Blood Pressure (Exit) 100/68   Heart Rate (Admit) 63 bpm   Heart Rate (Exercise) 97 bpm   Heart Rate (Exit) 60 bpm   Rating of Perceived Exertion (Exercise) 11   Symptoms none   Duration Progress to 30 minutes of continuous aerobic without signs/symptoms of physical distress   Intensity THRR unchanged     Progression   Progression Continue to progress workloads to maintain intensity without signs/symptoms of physical distress.   Average METs 2.4     Resistance Training   Training Prescription Yes   Weight 5lbs   Reps 10-12     Interval Training   Interval Training No     Recumbant Bike   Level 2   Minutes 10   METs 2.8     NuStep   Level 3   Minutes 10   METs 2.1     Track   Laps 10   Minutes 10   METs 2.74      Nutrition:  Target Goals:  Understanding of nutrition guidelines, daily intake of sodium 1500mg , cholesterol 200mg , calories 30% from fat and 7% or less from saturated fats, daily to have 5 or more servings of fruits and vegetables.  Biometrics:     Pre Biometrics - 08/01/16 1358      Pre Biometrics   Height 5\' 10"  (1.778 m)   Weight 182 lb 5.1 oz (82.7 kg)   Waist Circumference 42 inches   Hip Circumference 42 inches   Waist to Hip Ratio 1 %   BMI (Calculated) 26.2   Triceps Skinfold 23 mm   % Body Fat 30 %   Grip Strength 34 kg   Flexibility 7.5 in   Single Leg Stand 0 seconds       Nutrition Therapy Plan and Nutrition Goals:     Nutrition Therapy & Goals - 08/05/16 1036      Nutrition Therapy   Diet Therapeutic Lifestyle Changes     Personal Nutrition Goals   Personal Goal #1 1-2 lb wt loss/week to a wt loss goal of 6-15 lb at graduation from [facility], educate and counsel regarding individualized specific dietary modifications aiming towards targeted core components such as weight, hypertension, lipid management, diabetes, heart failure and other comorbidities.   Expected Outcomes Short Term Goal: Understand basic principles of dietary content, such as calories, fat, sodium, cholesterol and nutrients.;Long Term Goal: Adherence to prescribed nutrition plan.      Nutrition Discharge: Nutrition Scores:     Nutrition Assessments - 08/05/16 1036      MEDFICTS Scores   Pre Score 106      Nutrition Goals Re-Evaluation:   Psychosocial: Target Goals: Acknowledge presence or absence of depression, maximize coping skills, provide positive support system. Participant is able to verbalize types and ability to use techniques and  skills needed for reducing stress and depression.  Initial Review & Psychosocial Screening:     Initial Psych Review & Screening - 08/01/16 1618      Family Dynamics   Good Support System? Yes  Pt wife accompanied  pt to orientation   Comments No psychosocial needs identified, will continue to monitor and re evaluate as needed.     Barriers   Psychosocial barriers to participate in program There are no identifiable barriers or psychosocial needs.      Quality of Life Scores:     Quality of Life - 08/01/16 1400      Quality of Life Scores   Health/Function Pre 22.89 %   Socioeconomic Pre 22 %   Psych/Spiritual Pre 27.43 %   Family Pre 28.8 %   GLOBAL Pre 25.08 %      PHQ-9: Recent Review Flowsheet Data    Depression screen [facility] 2/9 08/01/2016   Decreased Interest 0   Down, Depressed, Hopeless 0   PHQ - 2 Score 0      Psychosocial Evaluation and Intervention:     Psychosocial Evaluation - 08/22/16 1435      Psychosocial Evaluation & Interventions   Interventions Encouraged to exercise with the program and follow exercise prescription   Comments Pt has supportive wife who accompanies him to rehab.     Discharge Psychosocial Assessment & Intervention   Discharge Continue support measures as needed      Psychosocial Re-Evaluation:     Psychosocial Re-Evaluation    [place] Name 08/22/16 1436             Psychosocial Re-Evaluation   Interventions Encouraged to attend Cardiac Rehabilitation for the exercise          Vocational Rehabilitation: Provide vocational rehab assistance to qualifying candidates.   Vocational Rehab Evaluation & Intervention:     Vocational Rehab - 08/01/16 1620      Initial Vocational Rehab Evaluation & Intervention   Assessment shows need for Vocational Rehabilitation No  Pt does not plan to return to work.  Pt is retired.      Education: Education Goals: Education classes will be provided on a weekly basis, covering required topics. Participant will state understanding/return demonstration of topics presented.  Learning Barriers/Preferences:     Learning Barriers/Preferences - 08/01/16 0813      Learning Barriers/Preferences    Learning Barriers Sight   Learning Preferences Video      Education Topics: Count Your Pulse:  -Group instruction provided by verbal instruction, demonstration, patient participation and written materials to support subject.  Instructors address importance of being able to find your pulse and how to count your pulse when at home without a heart monitor.  Patients get hands on experience counting their pulse with staff help and individually.   Heart Attack, Angina, and Risk Factor Modification:  -Group instruction provided by verbal instruction, video, and written materials to support subject.  Instructors address signs and symptoms of angina and heart attacks.    Also discuss risk factors for heart disease and how to make changes to improve heart health risk factors.   Functional Fitness:  -Group instruction provided by verbal instruction, demonstration, patient participation, and written materials to support subject.  Instructors address safety measures for doing things around the house.  Discuss how to get up and down off the floor, how to pick things up properly, how to safely get out of a chair without assistance, and balance training.   Meditation and  Mindfulness:  -Group instruction provided by verbal instruction, patient participation, and written materials to support subject.  Instructor addresses importance of mindfulness and meditation practice to help reduce stress and improve awareness.  Instructor also leads participants through a meditation exercise.  Flowsheet Row CARDIAC REHAB PHASE II EXERCISE from 08/21/2016 in [facility]  Date  08/07/16  Instruction Review Code  2- meets goals/outcomes      Stretching for Flexibility and Mobility:  -Group instruction provided by verbal instruction, patient participation, and written materials to support subject.  Instructors lead participants through series of stretches that are designed to increase  flexibility thus improving mobility.  These stretches are additional exercise for major muscle groups that are typically performed during regular warm up and cool down.   Hands Only CPR Anytime:  -Group instruction provided by verbal instruction, video, patient participation and written materials to support subject.  Instructors co-teach with AHA video for hands only CPR.  Participants get hands on experience with mannequins.   Nutrition I class: Heart Healthy Eating:  -Group instruction provided by PowerPoint slides, verbal discussion, and written materials to support subject matter. The instructor gives an explanation and review of the Therapeutic Lifestyle Changes diet recommendations, which includes a discussion on lipid goals, dietary fat, sodium, fiber, plant stanol/sterol esters, sugar, and the components of a well-balanced, healthy diet.   Nutrition II class: Lifestyle Skills:  -Group instruction provided by PowerPoint slides, verbal discussion, and written materials to support subject matter. The instructor gives an explanation and review of label reading, grocery shopping for heart health, heart healthy recipe modifications, and ways to make healthier choices when eating out.   Diabetes Question & Answer:  -Group instruction provided by PowerPoint slides, verbal discussion, and written materials to support subject matter. The instructor gives an explanation and review of diabetes co-morbidities, pre- and post-prandial blood glucose goals, pre-exercise blood glucose goals, signs, symptoms, and treatment of hypoglycemia and hyperglycemia, and foot care basics.   Diabetes Blitz:  -Group instruction provided by PowerPoint slides, verbal discussion, and written materials to support subject matter. The instructor gives an explanation and review of the physiology behind type 1 and type 2 diabetes, diabetes medications and rational behind using different medications, pre- and post-prandial  blood glucose recommendations and Hemoglobin A1c goals, diabetes diet, and exercise including blood glucose guidelines for exercising safely.    Portion Distortion:  -Group instruction provided by PowerPoint slides, verbal discussion, written materials, and food models to support subject matter. The instructor gives an explanation of serving size versus portion size, changes in portions sizes over the last 20 years, and what consists of a serving from each food group.   Stress Management:  -Group instruction provided by verbal instruction, video, and written materials to support subject matter.  Instructors review role of stress in heart disease and how to cope with stress positively.     Exercising on Your Own:  -Group instruction provided by verbal instruction, power point, and written materials to support subject.  Instructors discuss benefits of exercise, components of exercise, frequency and intensity of exercise, and end points for exercise.  Also discuss use of nitroglycerin and activating EMS.  Review options of places to exercise outside of rehab.  Review guidelines for sex with heart disease.   Cardiac Drugs I:  -Group instruction provided by verbal instruction and written materials to support subject.  Instructor reviews cardiac drug classes: antiplatelets, anticoagulants, beta blockers, and statins.  Instructor discusses reasons, side effects,  and lifestyle considerations for each drug class.   Cardiac Drugs II:  -Group instruction provided by verbal instruction and written materials to support subject.  Instructor reviews cardiac drug classes: angiotensin converting enzyme inhibitors (ACE-I), angiotensin II receptor blockers (ARBs), nitrates, and calcium channel blockers.  Instructor discusses reasons, side effects, and lifestyle considerations for each drug class. Flowsheet Row CARDIAC REHAB PHASE II EXERCISE from 08/21/2016 in [place]  Date   08/21/16  Instruction Review Code  2- meets goals/outcomes      Anatomy and Physiology of the Circulatory System:  -Group instruction provided by verbal instruction, video, and written materials to support subject.  Reviews functional anatomy of heart, how it relates to various diagnoses, and what role the heart plays in the overall system. Flowsheet Row CARDIAC REHAB PHASE II EXERCISE from 08/21/2016 in [place]  Date  08/14/16  Instruction Review Code  2- meets goals/outcomes      Knowledge Questionnaire Score:     Knowledge Questionnaire Score - 08/01/16 1353      Knowledge Questionnaire Score   Pre Score 18/24      Core Components/Risk Factors/Patient Goals at Admission:     Personal Goals and Risk Factors at Admission - 08/01/16 4098      Core Components/Risk Factors/Patient Goals on Admission   Sedentary Yes   Intervention Provide advice, education, support and counseling about physical activity/exercise needs.;Develop an individualized exercise prescription for aerobic and resistive training based on initial evaluation findings, risk stratification, comorbidities and participant's personal goals.   Expected Outcomes Achievement of increased cardiorespiratory fitness and enhanced flexibility, muscular endurance and strength shown through measurements of functional capacity and personal statement of participant.   Increase Strength and Stamina Yes   Intervention Provide advice, education, support and counseling about physical activity/exercise needs.;Develop an individualized exercise prescription for aerobic and resistive training based on initial evaluation findings, risk stratification, comorbidities and participant's personal goals.   Expected Outcomes Achievement of increased cardiorespiratory fitness and enhanced flexibility, muscular endurance and strength shown through measurements of functional capacity and personal statement of  participant.   Improve shortness of breath with ADL's Yes   Intervention Provide education, individualized exercise plan and daily activity instruction to help decrease symptoms of SOB with activities of daily living.   Expected Outcomes Short Term: Achieves a reduction of symptoms when performing activities of daily living.   Heart Failure Yes   Intervention Provide a combined exercise and nutrition program that is supplemented with education, support and counseling about heart failure. Directed toward relieving symptoms such as shortness of breath, decreased exercise tolerance, and extremity edema.   Expected Outcomes Improve functional capacity of life   Personal Goal Other Yes   Personal Goal Increase leg strength, and breathing while walking and doing yard work.    Intervention Individualized exercise program   Expected Outcomes increase functional capacity      Core Components/Risk Factors/Patient Goals Review:      Goals and Risk Factor Review    Row Name 08/21/16 1304             Core Components/Risk Factors/Patient Goals Review   Personal Goals Review Other;Increase Strength and Stamina       Review Pt states" getting stronger and feeling better" Breathing and SOB symptoms are improving. Pt is exercising at home and find it to be beneficial       Expected Outcomes Pt will continue with HEP and be able to  exercise without difficulty or complications          Core Components/Risk Factors/Patient Goals at Discharge (Final Review):      Goals and Risk Factor Review - 08/21/16 1304      Core Components/Risk Factors/Patient Goals Review   Personal Goals Review Other;Increase Strength and Stamina   Review Pt states" getting stronger and feeling better" Breathing and SOB symptoms are improving. Pt is exercising at home and find it to be beneficial   Expected Outcomes Pt will continue with HEP and be able to exercise without difficulty or complications      ITP Comments:      ITP Comments    Row Name 08/01/16 1410           ITP Comments Dr. Fransico Him, Medical Director          Comments:  Pt is making expected progress toward personal goals after completing 6 sessions. Psychosocial Assessment - Pt wife accompanies him to rehab.  Pt is engaged in the rehab process, demonstrating positive and humorous outlook on life. Recommend continued exercise and life style modification education including  stress management and relaxation techniques to decrease cardiac risk profile. Cherre Huger, BSN

## 2016-08-23 ENCOUNTER — Encounter (HOSPITAL_COMMUNITY)
Admission: RE | Admit: 2016-08-23 | Discharge: 2016-08-23 | Disposition: A | Discharge status: Home / Self Care | Payer: Medicare Other | Source: Ambulatory Visit | Attending: Internal Medicine | Admitting: Internal Medicine

## 2016-08-23 DIAGNOSIS — I110 Hypertensive heart disease with heart failure: Secondary | ICD-10-CM | POA: Diagnosis not present

## 2016-08-23 DIAGNOSIS — I2510 Atherosclerotic heart disease of native coronary artery without angina pectoris: Secondary | ICD-10-CM | POA: Diagnosis not present

## 2016-08-23 DIAGNOSIS — I5022 Chronic systolic (congestive) heart failure: Secondary | ICD-10-CM | POA: Diagnosis not present

## 2016-08-23 DIAGNOSIS — I4891 Unspecified atrial fibrillation: Secondary | ICD-10-CM | POA: Diagnosis not present

## 2016-08-23 DIAGNOSIS — E785 Hyperlipidemia, unspecified: Secondary | ICD-10-CM | POA: Diagnosis not present

## 2016-08-23 LAB — CUP PACEART REMOTE DEVICE CHECK
Battery Remaining Percentage: 54 %
Battery Voltage: 2.95 V
Brady Statistic AP VP Percent: 97 %
Brady Statistic AS VP Percent: 2 %
Brady Statistic RA Percent Paced: 97 %
HIGH POWER IMPEDANCE MEASURED VALUE: 44 Ohm
HighPow Impedance: 44 Ohm
Implantable Lead Implant Date: 20060509
Implantable Lead Location: 753859
Implantable Lead Location: 753860
Implantable Lead Model: 5076
Lead Channel Impedance Value: 380 Ohm
Lead Channel Impedance Value: 450 Ohm
Lead Channel Pacing Threshold Amplitude: 0.75 V
Lead Channel Pacing Threshold Amplitude: 1.25 V
Lead Channel Pacing Threshold Pulse Width: 0.8 ms
Lead Channel Pacing Threshold Pulse Width: 0.8 ms
Lead Channel Sensing Intrinsic Amplitude: 12 mV
Lead Channel Setting Pacing Amplitude: 2 V
Lead Channel Setting Pacing Pulse Width: 0.8 ms
MDC IDC LEAD IMPLANT DT: 20060509
MDC IDC LEAD IMPLANT DT: 20090417
MDC IDC LEAD LOCATION: 753858
MDC IDC LEAD MODEL: 7001
MDC IDC MSMT BATTERY REMAINING LONGEVITY: 38 mo
MDC IDC MSMT LEADCHNL RA IMPEDANCE VALUE: 400 Ohm
MDC IDC MSMT LEADCHNL RA PACING THRESHOLD AMPLITUDE: 1 V
MDC IDC MSMT LEADCHNL RA PACING THRESHOLD PULSEWIDTH: 0.5 ms
MDC IDC MSMT LEADCHNL RA SENSING INTR AMPL: 3.5 mV
MDC IDC PG IMPLANT DT: 20141208
MDC IDC PG SERIAL: 7138995
MDC IDC SESS DTM: 20171011080016
MDC IDC SET LEADCHNL LV PACING AMPLITUDE: 2 V
MDC IDC SET LEADCHNL LV PACING PULSEWIDTH: 0.8 ms
MDC IDC SET LEADCHNL RV PACING AMPLITUDE: 2.5 V
MDC IDC SET LEADCHNL RV SENSING SENSITIVITY: 0.5 mV
MDC IDC STAT BRADY AP VS PERCENT: 1 %
MDC IDC STAT BRADY AS VS PERCENT: 1 %

## 2016-08-26 ENCOUNTER — Encounter (HOSPITAL_COMMUNITY)
Admission: RE | Admit: 2016-08-26 | Discharge: 2016-08-26 | Disposition: A | Discharge status: Home / Self Care | Payer: Medicare Other | Source: Ambulatory Visit | Attending: Internal Medicine | Admitting: Internal Medicine

## 2016-08-26 DIAGNOSIS — I4891 Unspecified atrial fibrillation: Secondary | ICD-10-CM | POA: Diagnosis not present

## 2016-08-26 DIAGNOSIS — I5022 Chronic systolic (congestive) heart failure: Secondary | ICD-10-CM | POA: Diagnosis not present

## 2016-08-26 DIAGNOSIS — I110 Hypertensive heart disease with heart failure: Secondary | ICD-10-CM | POA: Diagnosis not present

## 2016-08-26 DIAGNOSIS — E785 Hyperlipidemia, unspecified: Secondary | ICD-10-CM | POA: Diagnosis not present

## 2016-08-26 DIAGNOSIS — I2510 Atherosclerotic heart disease of native coronary artery without angina pectoris: Secondary | ICD-10-CM | POA: Diagnosis not present

## 2016-08-28 ENCOUNTER — Encounter (HOSPITAL_COMMUNITY)
Admission: RE | Admit: 2016-08-28 | Discharge: 2016-08-28 | Disposition: A | Discharge status: Home / Self Care | Payer: Medicare Other | Source: Ambulatory Visit | Attending: Internal Medicine | Admitting: Internal Medicine

## 2016-08-28 DIAGNOSIS — I5022 Chronic systolic (congestive) heart failure: Secondary | ICD-10-CM | POA: Diagnosis not present

## 2016-08-28 DIAGNOSIS — I4891 Unspecified atrial fibrillation: Secondary | ICD-10-CM | POA: Diagnosis not present

## 2016-08-28 DIAGNOSIS — I110 Hypertensive heart disease with heart failure: Secondary | ICD-10-CM | POA: Diagnosis not present

## 2016-08-28 DIAGNOSIS — E785 Hyperlipidemia, unspecified: Secondary | ICD-10-CM | POA: Diagnosis not present

## 2016-08-28 DIAGNOSIS — I2510 Atherosclerotic heart disease of native coronary artery without angina pectoris: Secondary | ICD-10-CM | POA: Diagnosis not present

## 2016-08-30 ENCOUNTER — Encounter (HOSPITAL_COMMUNITY)
Admission: RE | Admit: 2016-08-30 | Discharge: 2016-08-30 | Disposition: A | Discharge status: Home / Self Care | Payer: Medicare Other | Source: Ambulatory Visit | Attending: Internal Medicine | Admitting: Internal Medicine

## 2016-08-30 DIAGNOSIS — I5022 Chronic systolic (congestive) heart failure: Secondary | ICD-10-CM | POA: Diagnosis not present

## 2016-08-30 DIAGNOSIS — E785 Hyperlipidemia, unspecified: Secondary | ICD-10-CM | POA: Diagnosis not present

## 2016-08-30 DIAGNOSIS — I110 Hypertensive heart disease with heart failure: Secondary | ICD-10-CM | POA: Diagnosis not present

## 2016-08-30 DIAGNOSIS — I4891 Unspecified atrial fibrillation: Secondary | ICD-10-CM | POA: Diagnosis not present

## 2016-08-30 DIAGNOSIS — I2510 Atherosclerotic heart disease of native coronary artery without angina pectoris: Secondary | ICD-10-CM | POA: Diagnosis not present

## 2016-09-02 ENCOUNTER — Encounter (HOSPITAL_COMMUNITY)
Admission: RE | Admit: 2016-09-02 | Discharge: 2016-09-02 | Disposition: A | Discharge status: Home / Self Care | Payer: Medicare Other | Source: Ambulatory Visit | Attending: Internal Medicine | Admitting: Internal Medicine

## 2016-09-02 DIAGNOSIS — I4891 Unspecified atrial fibrillation: Secondary | ICD-10-CM | POA: Diagnosis not present

## 2016-09-02 DIAGNOSIS — I5022 Chronic systolic (congestive) heart failure: Secondary | ICD-10-CM

## 2016-09-02 DIAGNOSIS — E785 Hyperlipidemia, unspecified: Secondary | ICD-10-CM | POA: Diagnosis not present

## 2016-09-02 DIAGNOSIS — I110 Hypertensive heart disease with heart failure: Secondary | ICD-10-CM | POA: Diagnosis not present

## 2016-09-02 DIAGNOSIS — I2510 Atherosclerotic heart disease of native coronary artery without angina pectoris: Secondary | ICD-10-CM | POA: Diagnosis not present

## 2016-09-04 ENCOUNTER — Telehealth (HOSPITAL_COMMUNITY): Payer: Self-pay | Admitting: Internal Medicine

## 2016-09-04 ENCOUNTER — Encounter (HOSPITAL_COMMUNITY): Payer: Medicare Other

## 2016-09-06 ENCOUNTER — Encounter (HOSPITAL_COMMUNITY): Payer: Medicare Other

## 2016-09-09 ENCOUNTER — Encounter (HOSPITAL_COMMUNITY): Payer: Medicare Other

## 2016-09-09 ENCOUNTER — Telehealth (HOSPITAL_COMMUNITY): Payer: Self-pay | Admitting: Internal Medicine

## 2016-09-10 ENCOUNTER — Emergency Department (HOSPITAL_COMMUNITY): Payer: Medicare Other

## 2016-09-10 ENCOUNTER — Encounter (HOSPITAL_COMMUNITY): Payer: Self-pay

## 2016-09-10 ENCOUNTER — Inpatient Hospital Stay (HOSPITAL_COMMUNITY)
Admission: EM | Admit: 2016-09-10 | Discharge: 2016-09-13 | DRG: 189 | Disposition: A | Discharge status: Home Health | Payer: Medicare Other | Attending: Family Medicine | Admitting: Family Medicine

## 2016-09-10 DIAGNOSIS — R05 Cough: Secondary | ICD-10-CM | POA: Diagnosis not present

## 2016-09-10 DIAGNOSIS — I130 Hypertensive heart and chronic kidney disease with heart failure and stage 1 through stage 4 chronic kidney disease, or unspecified chronic kidney disease: Secondary | ICD-10-CM | POA: Diagnosis present

## 2016-09-10 DIAGNOSIS — J209 Acute bronchitis, unspecified: Secondary | ICD-10-CM | POA: Diagnosis not present

## 2016-09-10 DIAGNOSIS — E782 Mixed hyperlipidemia: Secondary | ICD-10-CM | POA: Diagnosis present

## 2016-09-10 DIAGNOSIS — R0989 Other specified symptoms and signs involving the circulatory and respiratory systems: Secondary | ICD-10-CM | POA: Diagnosis not present

## 2016-09-10 DIAGNOSIS — I480 Paroxysmal atrial fibrillation: Secondary | ICD-10-CM | POA: Diagnosis not present

## 2016-09-10 DIAGNOSIS — J9601 Acute respiratory failure with hypoxia: Principal | ICD-10-CM | POA: Diagnosis present

## 2016-09-10 DIAGNOSIS — N184 Chronic kidney disease, stage 4 (severe): Secondary | ICD-10-CM | POA: Diagnosis not present

## 2016-09-10 DIAGNOSIS — Z8551 Personal history of malignant neoplasm of bladder: Secondary | ICD-10-CM

## 2016-09-10 DIAGNOSIS — Z6827 Body mass index (BMI) 27.0-27.9, adult: Secondary | ICD-10-CM | POA: Diagnosis not present

## 2016-09-10 DIAGNOSIS — I4891 Unspecified atrial fibrillation: Secondary | ICD-10-CM | POA: Diagnosis present

## 2016-09-10 DIAGNOSIS — Z823 Family history of stroke: Secondary | ICD-10-CM

## 2016-09-10 DIAGNOSIS — I255 Ischemic cardiomyopathy: Secondary | ICD-10-CM | POA: Diagnosis present

## 2016-09-10 DIAGNOSIS — Z95810 Presence of automatic (implantable) cardiac defibrillator: Secondary | ICD-10-CM

## 2016-09-10 DIAGNOSIS — I10 Essential (primary) hypertension: Secondary | ICD-10-CM | POA: Diagnosis not present

## 2016-09-10 DIAGNOSIS — J440 Chronic obstructive pulmonary disease with acute lower respiratory infection: Secondary | ICD-10-CM

## 2016-09-10 DIAGNOSIS — I5022 Chronic systolic (congestive) heart failure: Secondary | ICD-10-CM | POA: Diagnosis present

## 2016-09-10 DIAGNOSIS — Z87891 Personal history of nicotine dependence: Secondary | ICD-10-CM | POA: Diagnosis not present

## 2016-09-10 DIAGNOSIS — I2510 Atherosclerotic heart disease of native coronary artery without angina pectoris: Secondary | ICD-10-CM | POA: Diagnosis present

## 2016-09-10 DIAGNOSIS — G250 Essential tremor: Secondary | ICD-10-CM | POA: Diagnosis present

## 2016-09-10 DIAGNOSIS — I509 Heart failure, unspecified: Secondary | ICD-10-CM | POA: Diagnosis not present

## 2016-09-10 DIAGNOSIS — N400 Enlarged prostate without lower urinary tract symptoms: Secondary | ICD-10-CM | POA: Diagnosis present

## 2016-09-10 DIAGNOSIS — E039 Hypothyroidism, unspecified: Secondary | ICD-10-CM | POA: Diagnosis present

## 2016-09-10 DIAGNOSIS — G4730 Sleep apnea, unspecified: Secondary | ICD-10-CM | POA: Diagnosis present

## 2016-09-10 DIAGNOSIS — K5900 Constipation, unspecified: Secondary | ICD-10-CM | POA: Diagnosis not present

## 2016-09-10 DIAGNOSIS — I252 Old myocardial infarction: Secondary | ICD-10-CM

## 2016-09-10 DIAGNOSIS — Z7901 Long term (current) use of anticoagulants: Secondary | ICD-10-CM

## 2016-09-10 DIAGNOSIS — Z8249 Family history of ischemic heart disease and other diseases of the circulatory system: Secondary | ICD-10-CM

## 2016-09-10 DIAGNOSIS — K219 Gastro-esophageal reflux disease without esophagitis: Secondary | ICD-10-CM | POA: Diagnosis present

## 2016-09-10 DIAGNOSIS — Z79899 Other long term (current) drug therapy: Secondary | ICD-10-CM

## 2016-09-10 DIAGNOSIS — Z880 Allergy status to penicillin: Secondary | ICD-10-CM

## 2016-09-10 DIAGNOSIS — Z9861 Coronary angioplasty status: Secondary | ICD-10-CM

## 2016-09-10 DIAGNOSIS — Z85828 Personal history of other malignant neoplasm of skin: Secondary | ICD-10-CM

## 2016-09-10 DIAGNOSIS — J210 Acute bronchiolitis due to respiratory syncytial virus: Secondary | ICD-10-CM | POA: Diagnosis present

## 2016-09-10 HISTORY — DX: Chronic obstructive pulmonary disease with (acute) lower respiratory infection: J44.0

## 2016-09-10 HISTORY — DX: Acute bronchitis, unspecified: J44.0

## 2016-09-10 HISTORY — DX: Acute bronchitis, unspecified: J20.9

## 2016-09-10 LAB — RESPIRATORY PANEL BY PCR
Adenovirus: NOT DETECTED
Bordetella pertussis: NOT DETECTED
CHLAMYDOPHILA PNEUMONIAE-RVPPCR: NOT DETECTED
CORONAVIRUS 229E-RVPPCR: NOT DETECTED
CORONAVIRUS OC43-RVPPCR: NOT DETECTED
Coronavirus HKU1: NOT DETECTED
Coronavirus NL63: NOT DETECTED
INFLUENZA B-RVPPCR: NOT DETECTED
Influenza A: NOT DETECTED
MYCOPLASMA PNEUMONIAE-RVPPCR: NOT DETECTED
Metapneumovirus: NOT DETECTED
PARAINFLUENZA VIRUS 1-RVPPCR: NOT DETECTED
Parainfluenza Virus 2: NOT DETECTED
Parainfluenza Virus 3: NOT DETECTED
Parainfluenza Virus 4: NOT DETECTED
RESPIRATORY SYNCYTIAL VIRUS-RVPPCR: DETECTED — AB
Rhinovirus / Enterovirus: DETECTED — AB

## 2016-09-10 LAB — CBC WITH DIFFERENTIAL/PLATELET
Basophils Absolute: 0 10*3/uL (ref 0.0–0.1)
Basophils Relative: 0 %
EOS PCT: 1 %
Eosinophils Absolute: 0.1 10*3/uL (ref 0.0–0.7)
HCT: 39.7 % (ref 39.0–52.0)
Hemoglobin: 13 g/dL (ref 13.0–17.0)
LYMPHS ABS: 1.9 10*3/uL (ref 0.7–4.0)
LYMPHS PCT: 25 %
MCH: 30.2 pg (ref 26.0–34.0)
MCHC: 32.7 g/dL (ref 30.0–36.0)
MCV: 92.3 fL (ref 78.0–100.0)
MONO ABS: 1.2 10*3/uL — AB (ref 0.1–1.0)
MONOS PCT: 15 %
Neutro Abs: 4.5 10*3/uL (ref 1.7–7.7)
Neutrophils Relative %: 59 %
PLATELETS: 182 10*3/uL (ref 150–400)
RBC: 4.3 MIL/uL (ref 4.22–5.81)
RDW: 13.5 % (ref 11.5–15.5)
WBC: 7.6 10*3/uL (ref 4.0–10.5)

## 2016-09-10 LAB — BASIC METABOLIC PANEL
Anion gap: 12 (ref 5–15)
BUN: 33 mg/dL — AB (ref 6–20)
CALCIUM: 9 mg/dL (ref 8.9–10.3)
CO2: 22 mmol/L (ref 22–32)
Chloride: 104 mmol/L (ref 101–111)
Creatinine, Ser: 2.93 mg/dL — ABNORMAL HIGH (ref 0.61–1.24)
GFR calc Af Amer: 21 mL/min — ABNORMAL LOW (ref >60)
GFR, EST NON AFRICAN AMERICAN: 19 mL/min — AB (ref >60)
GLUCOSE: 102 mg/dL — AB (ref 65–99)
POTASSIUM: 4.4 mmol/L (ref 3.5–5.1)
Sodium: 138 mmol/L (ref 135–145)

## 2016-09-10 LAB — INFLUENZA PANEL BY PCR (TYPE A & B)
INFLBPCR: NEGATIVE
Influenza A By PCR: NEGATIVE

## 2016-09-10 LAB — I-STAT TROPONIN, ED
TROPONIN I, POC: 0 ng/mL (ref 0.00–0.08)
Troponin i, poc: 0.01 ng/mL (ref 0.00–0.08)

## 2016-09-10 LAB — PROTIME-INR
INR: 1.54
Prothrombin Time: 18.6 seconds — ABNORMAL HIGH (ref 11.4–15.2)

## 2016-09-10 LAB — BRAIN NATRIURETIC PEPTIDE: B Natriuretic Peptide: 287.3 pg/mL — ABNORMAL HIGH (ref 0.0–100.0)

## 2016-09-10 LAB — PHOSPHORUS: PHOSPHORUS: 3.3 mg/dL (ref 2.5–4.6)

## 2016-09-10 LAB — PROCALCITONIN

## 2016-09-10 LAB — SEDIMENTATION RATE: Sed Rate: 17 mm/hr — ABNORMAL HIGH (ref 0–16)

## 2016-09-10 LAB — MAGNESIUM: MAGNESIUM: 2.5 mg/dL — AB (ref 1.7–2.4)

## 2016-09-10 LAB — STREP PNEUMONIAE URINARY ANTIGEN: STREP PNEUMO URINARY ANTIGEN: NEGATIVE

## 2016-09-10 MED ORDER — TAMSULOSIN HCL 0.4 MG PO CAPS
0.4000 mg | ORAL_CAPSULE | Freq: Every day | ORAL | Status: DC
  Administered 2016-09-10 – 2016-09-12 (×3): 0.4 mg via ORAL
  Filled 2016-09-10 (×3): qty 1

## 2016-09-10 MED ORDER — CARVEDILOL 6.25 MG PO TABS
6.2500 mg | ORAL_TABLET | Freq: Two times a day (BID) | ORAL | Status: DC
  Administered 2016-09-10 – 2016-09-13 (×6): 6.25 mg via ORAL
  Filled 2016-09-10 (×6): qty 1

## 2016-09-10 MED ORDER — DIPHENHYDRAMINE-APAP (SLEEP) 25-500 MG PO TABS: 1.0000 | ORAL_TABLET | Freq: Every evening | ORAL | Status: DC | PRN

## 2016-09-10 MED ORDER — IPRATROPIUM-ALBUTEROL 0.5-2.5 (3) MG/3ML IN SOLN
3.0000 mL | Freq: Four times a day (QID) | RESPIRATORY_TRACT | Status: DC
  Administered 2016-09-10: 3 mL via RESPIRATORY_TRACT
  Filled 2016-09-10: qty 3

## 2016-09-10 MED ORDER — IPRATROPIUM-ALBUTEROL 0.5-2.5 (3) MG/3ML IN SOLN
3.0000 mL | RESPIRATORY_TRACT | Status: DC | PRN
  Administered 2016-09-11 – 2016-09-12 (×2): 3 mL via RESPIRATORY_TRACT
  Filled 2016-09-10 (×2): qty 3

## 2016-09-10 MED ORDER — ACETAMINOPHEN 650 MG RE SUPP: 650.0000 mg | Freq: Four times a day (QID) | RECTAL | Status: DC | PRN

## 2016-09-10 MED ORDER — FUROSEMIDE 20 MG PO TABS
20.0000 mg | ORAL_TABLET | Freq: Every day | ORAL | Status: DC
  Administered 2016-09-11 – 2016-09-13 (×3): 20 mg via ORAL
  Filled 2016-09-10 (×3): qty 1

## 2016-09-10 MED ORDER — EZETIMIBE 10 MG PO TABS
10.0000 mg | ORAL_TABLET | Freq: Every day | ORAL | Status: DC
Start: 2016-09-10 — End: 2016-09-13
  Administered 2016-09-10 – 2016-09-13 (×4): 10 mg via ORAL
  Filled 2016-09-10 (×4): qty 1

## 2016-09-10 MED ORDER — DEXTROSE 5 % IV SOLN
1.0000 g | INTRAVENOUS | Status: DC
  Administered 2016-09-10 – 2016-09-11 (×2): 1 g via INTRAVENOUS
  Filled 2016-09-10 (×3): qty 10

## 2016-09-10 MED ORDER — HYDROXYZINE HCL 25 MG PO TABS
25.0000 mg | ORAL_TABLET | Freq: Every day | ORAL | Status: DC
  Administered 2016-09-10 – 2016-09-12 (×3): 25 mg via ORAL
  Filled 2016-09-10 (×3): qty 1

## 2016-09-10 MED ORDER — IPRATROPIUM-ALBUTEROL 0.5-2.5 (3) MG/3ML IN SOLN
3.0000 mL | RESPIRATORY_TRACT | Status: AC
  Administered 2016-09-10 (×2): 3 mL via RESPIRATORY_TRACT
  Filled 2016-09-10: qty 3

## 2016-09-10 MED ORDER — ACETAMINOPHEN 325 MG PO TABS: 650.0000 mg | ORAL_TABLET | Freq: Four times a day (QID) | ORAL | Status: DC | PRN

## 2016-09-10 MED ORDER — IPRATROPIUM-ALBUTEROL 0.5-2.5 (3) MG/3ML IN SOLN
3.0000 mL | Freq: Three times a day (TID) | RESPIRATORY_TRACT | Status: DC
  Filled 2016-09-10 (×2): qty 3

## 2016-09-10 MED ORDER — DIPHENHYDRAMINE HCL 25 MG PO CAPS: 25.0000 mg | ORAL_CAPSULE | Freq: Every evening | ORAL | Status: DC | PRN

## 2016-09-10 MED ORDER — LEVOTHYROXINE SODIUM 50 MCG PO TABS
50.0000 ug | ORAL_TABLET | ORAL | Status: DC
  Administered 2016-09-12: 50 ug via ORAL
  Filled 2016-09-10 (×2): qty 1

## 2016-09-10 MED ORDER — METHYLPREDNISOLONE SODIUM SUCC 125 MG IJ SOLR
60.0000 mg | Freq: Four times a day (QID) | INTRAMUSCULAR | Status: DC
  Administered 2016-09-10 – 2016-09-11 (×5): 60 mg via INTRAVENOUS
  Filled 2016-09-10 (×5): qty 2

## 2016-09-10 MED ORDER — APIXABAN 2.5 MG PO TABS
2.5000 mg | ORAL_TABLET | Freq: Two times a day (BID) | ORAL | Status: DC
  Administered 2016-09-10 – 2016-09-13 (×6): 2.5 mg via ORAL
  Filled 2016-09-10 (×6): qty 1

## 2016-09-10 MED ORDER — LEVOTHYROXINE SODIUM 25 MCG PO TABS
25.0000 ug | ORAL_TABLET | ORAL | Status: DC
  Administered 2016-09-11 – 2016-09-13 (×2): 25 ug via ORAL

## 2016-09-10 MED ORDER — FERROUS SULFATE 325 (65 FE) MG PO TABS
325.0000 mg | ORAL_TABLET | ORAL | Status: DC
  Administered 2016-09-11 – 2016-09-13 (×2): 325 mg via ORAL
  Filled 2016-09-10 (×2): qty 1

## 2016-09-10 MED ORDER — BENZONATATE 100 MG PO CAPS
200.0000 mg | ORAL_CAPSULE | Freq: Three times a day (TID) | ORAL | Status: DC
  Administered 2016-09-10 – 2016-09-13 (×9): 200 mg via ORAL
  Filled 2016-09-10 (×9): qty 2

## 2016-09-10 MED ORDER — ONDANSETRON HCL 4 MG PO TABS: 4.0000 mg | ORAL_TABLET | Freq: Four times a day (QID) | ORAL | Status: DC | PRN

## 2016-09-10 MED ORDER — BUDESONIDE 0.5 MG/2ML IN SUSP
0.2500 mg | Freq: Two times a day (BID) | RESPIRATORY_TRACT | Status: DC
  Administered 2016-09-10 – 2016-09-13 (×5): 0.25 mg via RESPIRATORY_TRACT
  Filled 2016-09-10 (×6): qty 2

## 2016-09-10 MED ORDER — AMIODARONE HCL 200 MG PO TABS
100.0000 mg | ORAL_TABLET | Freq: Every day | ORAL | Status: DC
  Administered 2016-09-11 – 2016-09-13 (×3): 100 mg via ORAL
  Filled 2016-09-10 (×3): qty 1

## 2016-09-10 MED ORDER — POTASSIUM CHLORIDE CRYS ER 10 MEQ PO TBCR
10.0000 meq | EXTENDED_RELEASE_TABLET | Freq: Every day | ORAL | Status: DC
  Administered 2016-09-11 – 2016-09-13 (×3): 10 meq via ORAL
  Filled 2016-09-10 (×3): qty 1

## 2016-09-10 MED ORDER — ASPIRIN 81 MG PO CHEW
324.0000 mg | CHEWABLE_TABLET | Freq: Once | ORAL | Status: AC
  Administered 2016-09-10: 324 mg via ORAL
  Filled 2016-09-10: qty 4

## 2016-09-10 MED ORDER — PANTOPRAZOLE SODIUM 40 MG PO TBEC
40.0000 mg | DELAYED_RELEASE_TABLET | Freq: Every day | ORAL | Status: DC
  Administered 2016-09-11 – 2016-09-13 (×3): 40 mg via ORAL
  Filled 2016-09-10 (×3): qty 1

## 2016-09-10 MED ORDER — ROSUVASTATIN CALCIUM 20 MG PO TABS
20.0000 mg | ORAL_TABLET | Freq: Every day | ORAL | Status: DC
  Administered 2016-09-11 – 2016-09-13 (×3): 20 mg via ORAL
  Filled 2016-09-10 (×3): qty 1

## 2016-09-10 MED ORDER — DEXTROSE 5 % IV SOLN
500.0000 mg | INTRAVENOUS | Status: DC
Start: 2016-09-10 — End: 2016-09-11
  Administered 2016-09-10 – 2016-09-11 (×2): 500 mg via INTRAVENOUS
  Filled 2016-09-10 (×2): qty 500

## 2016-09-10 MED ORDER — TRAMADOL HCL 50 MG PO TABS: 50.0000 mg | ORAL_TABLET | Freq: Four times a day (QID) | ORAL | Status: DC | PRN

## 2016-09-10 MED ORDER — ONDANSETRON HCL 4 MG/2ML IJ SOLN: 4.0000 mg | Freq: Four times a day (QID) | INTRAMUSCULAR | Status: DC | PRN

## 2016-09-10 NOTE — ED Notes (Signed)
Attempted report x1. 

## 2016-09-10 NOTE — ED Triage Notes (Addendum)
Per [facility], Pt is coming from a [facility] where he was found to be SOB. Pt reports being SOB since Thursday with noted rales in upper lobes and crackles in bilateral lungs. Denies chest pain, but reports rib pain with cough. Denies oxygen at home. MD found patient at 80% on RA. HX of CHF. Pt was alert and oriented x4.  128/73 99% on NRB  66 Paced HR

## 2016-09-10 NOTE — ED Provider Notes (Signed)
[facility] DEPT Provider Note   CSN: [id number] Arrival date & time: 09/10/16  1111     History   Chief Complaint Chief Complaint  Patient presents with  . Shortness of Breath    HPI LYNETTE NOAH is a 82 y.o. male.  The history is provided by the patient and the EMS personnel.  Shortness of Breath  This is a recurrent problem. The problem occurs rarely.The current episode started 1 to 2 hours ago. The problem has been resolved. Associated symptoms include cough, wheezing and chest pain (with coughing). Pertinent negatives include no fever, no rhinorrhea, no sore throat, no ear pain, no vomiting, no abdominal pain, no rash and no leg swelling. He has tried nothing for the symptoms.    Past Medical History:  Diagnosis Date  . Acute bronchitis with COPD ([place]) 09/10/2016  . Anemia   . Atrial fibrillation or flutter    maintaining sinus on amiodarone    . Bladder cancer ([place]) dx'd 05/2012  . CAD (coronary artery disease)     a. s/p anterior MI 12/05 c/b shock -> stent LAD   b. s/p stenting OM-1, 2/06  . CHF (congestive heart failure) (HCC)    due to ischemic CM  a. EF 20-30%. (Nov 2008)   b. s/p St. Jude BiV-ICD    c. CPX 07/2008  pvo2 16.3 (63% predicted) slope 34 RER 1.08 O2 pulse 93%  . Cough    occasional /productive, no fever/ not new  . CRI (chronic renal insufficiency)    (baseline 2.0-2.2)/ recent hospitalization 8/13  . GERD (gastroesophageal reflux disease)    hx Barretts esophagitis  . History of blood transfusion    following coumadin  usage  . HTN (hypertension)    EKG 05/14/12,Chest x ray 8/13, last ICD interrogation 8/13 EPIC  . Hyperlipidemia   . Hypothyroidism   . 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 Skin cancer    basal cells   facial x 4, 1 right forearm  . Sleep apnea    STOP BANG SCORE 4    Patient Active Problem  List   Diagnosis Date Noted  . Acute respiratory failure with hypoxia ([place]) 09/10/2016  . Acute bronchitis with COPD ([place]) 09/10/2016  . CKD (chronic kidney disease) stage 4, GFR 15-29 ml/min (HCC) 04/04/2015  . 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 Urosepsis 08/16/2013  . Bruising 08/12/2012  . Bladder cancer ([place]) 07/08/2012  . Bladder tumor 05/18/2012  . Knee pain, acute, right 02/11/2012  . CAROTID ARTERY DISEASE 12/06/2010  . Essential hypertension, benign 09/22/2009  . Chronic systolic CHF (congestive heart failure) (HCC)-EF 25% 09/14/2009  . CHEST PAIN 08/08/2009  . Mixed hyperlipidemia 05/25/2009  . CAD, NATIVE VESSEL 02/01/2009  . Ischemic cardiomyopathy 01/24/2009  . Atrial fibrillation ([place]) 01/21/2009  . ICD (implantable cardioverter-defibrillator) in place (St Jude pacer/ICD) 01/21/2009    Past Surgical History:  Procedure Laterality Date  . [place]   lower back  . CARDIAC DEFIBRILLATOR PLACEMENT     ICD St Jude; gen change 09-20-13  . CERVICAL LAMINECTOMY  1985  . COLONOSCOPY    . CORONARY ANGIOPLASTY  2005,2006  . CYSTOSCOPY W/ RETROGRADES  05/25/2012   Procedure: CYSTOSCOPY WITH RETROGRADE PYELOGRAM;  Surgeon: Molli Hazard, MD;  Location: [facility];  Service: Urology;  Laterality:  Bilateral;  . CYSTOSCOPY W/ URETERAL STENT PLACEMENT  07/08/2012   Procedure: CYSTOSCOPY WITH STENT REPLACEMENT;  Surgeon: Molli Hazard, MD;  Location: [facility];  Service: Urology;  Laterality: Left;  . CYSTOSCOPY W/ URETERAL STENT REMOVAL  07/08/2012   Procedure: CYSTOSCOPY WITH STENT REMOVAL;  Surgeon: Molli Hazard, MD;  Location: [facility];  Service: Urology;  Laterality: Bilateral;  . CYSTOSCOPY/RETROGRADE/URETEROSCOPY  07/08/2012   Procedure: CYSTOSCOPY/RETROGRADE/URETEROSCOPY;  Surgeon: Molli Hazard, MD;  Location: [facility];  Service: Urology;  Laterality: Left;  . ESOPHAGOGASTRODUODENOSCOPY    . HERNIA  REPAIR  1989   bilateral  . IMPLANTABLE CARDIOVERTER DEFIBRILLATOR (ICD) GENERATOR CHANGE N/A 09/20/2013   Procedure: ICD GENERATOR CHANGE;  Surgeon: Deboraha Sprang, MD;  Location: [facility] CATH LAB;  Service: Cardiovascular;  Laterality: N/A;  . TRANSURETHRAL RESECTION OF BLADDER TUMOR  05/25/2012   cold cup biopsy prostate  . TRANSURETHRAL RESECTION OF BLADDER TUMOR  07/08/2012   Procedure: TRANSURETHRAL RESECTION OF BLADDER TUMOR (TURBT);  Surgeon: Molli Hazard, MD;  Location: [facility];  Service: Urology;  Laterality: N/A;  CYSTO, TURBT W/ GYRUS, BILATERAL STENT REMOVAL, LEFT URETEROSCOPY WITH BIOPSY, POSSIBLE LEFT STENT PLACEMENT        Home Medications    Prior to Admission medications   Medication Sig Start Date End Date Taking? Authorizing Provider  acetaminophen (TYLENOL) 325 MG tablet Take 2 tablets (650 mg total) by mouth every 6 (six) hours as needed for mild pain (or Fever >/= 101). 08/18/13  Yes [name], MD  amiodarone (PACERONE) 100 MG tablet Take 1 tablet (100 mg total) by mouth daily. 11/03/14  Yes Jolaine Artist, MD  benzonatate (TESSALON) 200 MG capsule Take 200 mg by mouth 3 (three) times daily as needed for cough.   Yes Historical Provider, MD  carvedilol (COREG) 6.25 MG tablet TAKE ONE TABLET BY MOUTH TWICE DAILY WITH MEALS 06/21/16  Yes Shaune Pascal Bensimhon, MD  diphenhydramine-acetaminophen (TYLENOL PM) 25-500 MG TABS tablet Take 1 tablet by mouth at bedtime as needed (sleep).   Yes Historical Provider, MD  ELIQUIS 2.5 MG TABS tablet TAKE ONE TABLET BY MOUTH TWICE DAILY 06/21/16  Yes Jolaine Artist, MD  ezetimibe (ZETIA) 10 MG tablet TAKE ONE TABLET BY MOUTH EVERY DAY WITH  SUPPER 12/27/15  Yes Jolaine Artist, MD  ferrous sulfate ([employer] IRON) 325 (65 FE) MG tablet Take 325 mg by mouth every Monday, Wednesday, and Friday.    Yes Historical Provider, MD  furosemide (LASIX) 20 MG tablet Take 20 mg by mouth daily.  08/21/13  Yes Historical Provider, MD  hydrOXYzine  (ATARAX/VISTARIL) 25 MG tablet Take 1 tablet by mouth at bedtime. Reported on 10/19/2015 11/16/13  Yes Historical Provider, MD  KLOR-CON M10 10 MEQ tablet Take 10 mEq by mouth daily.  06/26/13  Yes Historical Provider, MD  levofloxacin (LEVAQUIN) 250 MG tablet Take 250 mg by mouth daily.   Yes Historical Provider, MD  levothyroxine (SYNTHROID, LEVOTHROID) 50 MCG tablet Take 25-50 mcg by mouth daily before breakfast. Take 50 mcg (1 tablet) on Tues, Thurs, Sat, Sun and 25 mcg (1/2 tablet) on Mon, Wed, Fri   Yes Historical Provider, MD  Magnesium Hydroxide (MILK OF MAGNESIA PO) Take 20 mLs by mouth daily as needed (for constipation).   Yes Historical Provider, MD  Multiple Vitamins-Minerals (CENTRUM SILVER PO) Take 1 tablet by mouth daily.    Yes Historical Provider, MD  NITROSTAT 0.4 MG SL tablet PLACE ONE TABLET UNDER THE TONGUE EVERY  5 MINUTES AS NEEDED FOR CHEST PAIN 12/19/15  Yes Jolaine Artist, MD  pantoprazole (PROTONIX) 40 MG tablet Take 40 mg by mouth daily.  01/15/11  Yes Historical Provider, MD  Pumpkin Seed-Soy Germ (AZO BLADDER CONTROL/GO-LESS) CAPS Take 1 capsule by mouth as needed (for bladder incontinence).    Yes Historical Provider, MD  rosuvastatin (CRESTOR) 20 MG tablet Take 1 tablet (20 mg total) by mouth daily. 10/18/15  Yes Shaune Pascal Bensimhon, MD  sodium chloride (OCEAN) 0.65 % SOLN nasal spray Place 1 spray into both nostrils as needed for congestion.   Yes Historical Provider, MD  tamsulosin (FLOMAX) 0.4 MG CAPS capsule Take 1 capsule by mouth at bedtime. 11/23/13  Yes Historical Provider, MD  valsartan (DIOVAN) 80 MG tablet TAKE ONE-HALF TABLET BY MOUTH ONCE DAILY ONCE IF SYSTOLIC BLOOD PRESSURE IS GREATER THAN 100 01/24/16  Yes Jolaine Artist, MD    Family History Family History  Problem Relation Age of Onset  . Coronary artery disease    . Stroke      Social History Social History  Substance Use Topics  . Smoking status: Former Smoker    Types: Cigarettes, Cigars     Quit date: 03/02/2004  . Smokeless tobacco: Former Systems developer    Quit date: 05/20/1999     Comment: quit in 2005  . Alcohol use No     Allergies   Ramipril; Amoxicillin; and Penicillins   Review of Systems Review of Systems  Constitutional: Negative for chills and fever.  HENT: Negative for ear pain, rhinorrhea and sore throat.   Eyes: Negative for pain and visual disturbance.  Respiratory: Positive for cough, shortness of breath and wheezing.   Cardiovascular: Positive for chest pain (with coughing). Negative for palpitations and leg swelling.  Gastrointestinal: Negative for abdominal pain and vomiting.  Genitourinary: Negative for dysuria and hematuria.  Musculoskeletal: Negative for arthralgias and back pain.  Skin: Negative for color change and rash.  Neurological: Negative for seizures and syncope.  All other systems reviewed and are negative.    Physical Exam Updated Vital Signs BP 122/73   Pulse 67   Temp 98.9 F (37.2 C) (Oral)   Resp 15   Ht 5\' 10"  (1.778 m)   Wt 82.6 kg   SpO2 92%   BMI 26.11 kg/m   Physical Exam  Constitutional: He appears well-developed and well-nourished.  HENT:  Head: Normocephalic and atraumatic.  Eyes: Conjunctivae are normal.  Neck: Neck supple.  Cardiovascular: Normal rate and regular rhythm.   No murmur heard. Pulmonary/Chest: Effort normal. No respiratory distress. He has wheezes. He has rales. He exhibits no tenderness.  Abdominal: Soft. He exhibits no distension. There is no tenderness. There is no guarding.  Musculoskeletal: He exhibits no edema.  Neurological: He is alert.  Skin: Skin is warm and dry.  Psychiatric: He has a normal mood and affect.  Nursing note and vitals reviewed.    ED Treatments / Results  Labs (all labs ordered are listed, but only abnormal results are displayed) Labs Reviewed  PROTIME-INR - Abnormal; Notable for the following:       Result Value   Prothrombin Time 18.6 (*)    All other components  within normal limits  CBC WITH DIFFERENTIAL/PLATELET - Abnormal; Notable for the following:    Monocytes Absolute 1.2 (*)    All other components within normal limits  BASIC METABOLIC PANEL - Abnormal; Notable for the following:    Glucose, Bld 102 (*)  BUN 33 (*)    Creatinine, Ser 2.93 (*)    GFR calc non Af Amer 19 (*)    GFR calc Af Amer 21 (*)    All other components within normal limits  BRAIN NATRIURETIC PEPTIDE - Abnormal; Notable for the following:    B Natriuretic Peptide 287.3 (*)    All other components within normal limits  RESPIRATORY PANEL BY PCR  CULTURE, BLOOD (ROUTINE X 2)  CULTURE, BLOOD (ROUTINE X 2)  CULTURE, EXPECTORATED SPUTUM-ASSESSMENT  GRAM STAIN  INFLUENZA PANEL BY PCR (TYPE A & B, H1N1)  PROCALCITONIN  SEDIMENTATION RATE  STREP PNEUMONIAE URINARY ANTIGEN  I-STAT TROPOININ, ED  I-STAT TROPOININ, ED    EKG  EKG Interpretation  Date/Time:  Tuesday September 10 2016 11:18:09 EST Ventricular Rate:  64 PR Interval:    QRS Duration: 162 QT Interval:  523 QTC Calculation: 540 R Axis:   -95 Text Interpretation:  Sinus rhythm Right bundle branch block Inferior infarct, old Anterior infarct, old No acute changes Nonspecific ST and T wave abnormality Confirmed by Kathrynn Humble, MD, [facility] Comp [phone]) on 09/10/2016 11:48:42 AM       Radiology Dg Chest 2 View  Result Date: 09/10/2016 CLINICAL DATA:  Shortness of breast since Thursday, rales in upper lobes, crackles in BILATERAL lungs, rib pain with cough, history CHF, coronary artery disease EXAM: CHEST  2 VIEW COMPARISON:  08/16/2013, 02/18/2013 FINDINGS: LEFT subclavian AICD with leads projecting at RIGHT atrium, RIGHT ventricle and coronary sinus unchanged. Enlargement of cardiac silhouette. Atherosclerotic calcification aorta. Mediastinal contours and pulmonary vascularity normal. Hyperinflation consistent with COPD. Nodular density lateral mid RIGHT lung stable since 2014. Asymmetric biapical scarring RIGHT  greater than LEFT. No acute infiltrate, pleural effusion or pneumothorax. Pulmonary edema seen on previous exam resolved. Bones demineralized. IMPRESSION: Mild enlargement cardiac silhouette post AICD placement. Aortic atherosclerosis. COPD changes with biapical scarring. No acute infiltrate. Electronically Signed   By: Lavonia Dana M.D.   On: 09/10/2016 12:10    Procedures Procedures (including critical care time)  Medications Ordered in ED Medications  methylPREDNISolone sodium succinate (SOLU-MEDROL) 125 mg/2 mL injection 60 mg (60 mg Intravenous Given 09/10/16 1451)  ipratropium-albuterol (DUONEB) 0.5-2.5 (3) MG/3ML nebulizer solution 3 mL (3 mLs Nebulization Not Given 09/10/16 1459)  benzonatate (TESSALON) capsule 200 mg (200 mg Oral Given 09/10/16 1451)  budesonide (PULMICORT) nebulizer solution 0.25 mg (not administered)  cefTRIAXone (ROCEPHIN) 1 g in dextrose 5 % 50 mL IVPB (not administered)  azithromycin (ZITHROMAX) 500 mg in dextrose 5 % 250 mL IVPB (500 mg Intravenous New Bag/Given 09/10/16 1448)  aspirin chewable tablet 324 mg (324 mg Oral Given 09/10/16 1129)  ipratropium-albuterol (DUONEB) 0.5-2.5 (3) MG/3ML nebulizer solution 3 mL (3 mLs Nebulization Given 09/10/16 1336)     Initial Impression / Assessment and Plan / ED Course  I have reviewed the triage vital signs and the nursing notes.  Pertinent labs & imaging results that were available during my care of the patient were reviewed by me and considered in my medical decision making (see chart for details).  Clinical Course     82 year old gentleman comes to [place] today short of breath. Says he has intermittent cough with minimal sputum production. No fevers chills. He has not had shortness of breath like this before. Does have a history of CHF. Lungs have diffuse wheezing he was reported to be hypoxic to 80 put on nonrebreather by EMS, he was given Solu-Medrol as well as 1 albuterol nebulizer. He has much improvement of  symptoms. Here he is on room air with saturations greater than 90%. He has no formal diagnosis of COPD however this presentation with the negative lab work this far does appear to be a COPD exacerbation as he has responded well to treatments. He still feels tired after additional DuoNeb's and short of breath. He'll be brought in to the hospitalist service for further management. Patient and family agree to this plan. The main this patient's care please see inpatient hospitalist team notes. Of note his EKG showed a wide rhythm similar to previous no signs of acute ischemia arrhythmia interval abnormality. He has a chest x-ray shows no acute cardiopulmonary pathology, does show support of emphysematous disease.  Final Clinical Impressions(s) / ED Diagnoses   Final diagnoses:  Acute bronchitis with COPD [facility])    New Prescriptions New Prescriptions   No medications on file     Dewaine Conger, MD 09/10/16 [place], MD 09/11/16 1950

## 2016-09-10 NOTE — H&P (Signed)
History and Physical    [name] KGU:[id number] DOB: 04/27/1932 DOA: 09/10/2016   PCP: [name], MD   Patient coming from/Resides with: Private residence/is with wife  Admission status: Observation/adequate floor -it may be medically necessary to stay a minimum 2 midnights to rule out impending and/or unexpected changes in physiologic status that may differ from initial evaluation performed in the ER and/or at time of admission please consider reevaluation of admission status within 24 hours.   Chief Complaint: Shortness of breath, paroxysmal coughing and hypoxemia documented at PCP office  HPI: [name] is a 82 y.o. male with medical history significant for prior tobacco abuse with no prior diagnosis of COPD, known CAD with associated ischemic cardiomyopathy with EF 25% (followed at heart failure clinic/[facility]), hypothyroidism stage IV chronic kidney disease, pacer defibrillator, atrial fibrillation maintaining sinus rhythm on amiodarone, dyslipidemia, hypothyroidism, prior GI bleed on Coumadin but tolerating eliquis. Patient presents to ER from PCP office after failed outpatient therapy with Levaquin for upper respiratory infection symptoms associated with fatigue and productive cough. RA saturations at PCP office 80%. Symptoms been ongoing since last Thursday he worsened. He reports postnasal drip precipitating paroxysmal coughing episodes. Increased fatigue. Denies fevers chills or diarrhea. States did receive influenza vaccine this year. Does not utilize oxygen at home. No formal diagnosis of COPD.  ED Course:  Vital Signs: BP 122/73   Pulse 67   Temp 98.9 F (37.2 C) (Oral)   Resp 15   Ht '5\' 10"'  (1.778 m)   Wt 82.6 kg (182 lb)   SpO2 92%   BMI 26.11 kg/m  2 view chest x-ray: Mild enlargement cardiac silhouette, COPD changes with biapical scarring, no acute infiltrate or edema. Lab data: Sodium 138, potassium 4.4, CO2 22, BUN 33, creatinine 2.93, glucose  102, BNP 288, poc troponin 0.01, white count 7600 with normal differential, hemoglobin 13, platelets 182,000, PT 18.6, INR 1.54 Medications and treatments: Aspirin 324 mg 1, DuoNeb 2  Review of Systems:  In addition to the HPI above,  No Fever-chills, myalgias or other constitutional symptoms No Headache, changes with Vision or hearing, new weakness, tingling, numbness in any extremity, dizziness, dysarthria or word finding difficulty, gait disturbance or imbalance, tremors or seizure activity No problems swallowing food or Liquids, indigestion/reflux, choking or coughing while eating, abdominal pain with or after eating No Chest pain, palpitations, orthopnea  No Abdominal pain, N/V, melena,hematochezia, dark tarry stools, constipation No dysuria, malodorous urine, hematuria or flank pain No new skin rashes, lesions, masses or bruises, No new joint pains, aches, swelling or redness No recent unintentional weight gain or loss No polyuria, polydypsia or polyphagia   Past Medical History:  Diagnosis Date  . Acute bronchitis with COPD ([place]) 09/10/2016  . Anemia   . Atrial fibrillation or flutter    maintaining sinus on amiodarone    . Bladder cancer ([place]) dx'd 05/2012  . CAD (coronary artery disease)     a. s/p anterior MI 12/05 c/b shock -> stent LAD   b. s/p stenting OM-1, 2/06  . CHF (congestive heart failure) (HCC)    due to ischemic CM  a. EF 20-30%. (Nov 2008)   b. s/p St. Jude BiV-ICD    c. CPX 07/2008  pvo2 16.3 (63% predicted) slope 34 RER 1.08 O2 pulse 93%  . Cough    occasional /productive, no fever/ not new  . CRI (chronic renal insufficiency)    (baseline 2.0-2.2)/ recent hospitalization 8/13  . GERD (gastroesophageal reflux disease)  hx Barretts esophagitis  . History of blood transfusion    following coumadin  usage  . HTN (hypertension)    EKG 05/14/12,Chest x ray 8/13, last ICD interrogation 8/13 EPIC  . Hyperlipidemia   . Hypothyroidism   . Left ventricular lead  failure to capture on the ring electrode 12/16/2013  . 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 Skin cancer    basal cells   facial x 4, 1 right forearm  . Sleep apnea    STOP BANG SCORE 4    Past Surgical History:  Procedure Laterality Date  . [place]   lower back  . CARDIAC DEFIBRILLATOR PLACEMENT     ICD St Jude; gen change 09-20-13  . CERVICAL LAMINECTOMY  1985  . COLONOSCOPY    . CORONARY ANGIOPLASTY  2005,2006  . CYSTOSCOPY W/ RETROGRADES  05/25/2012   Procedure: CYSTOSCOPY WITH RETROGRADE PYELOGRAM;  Surgeon: Molli Hazard, MD;  Location: [facility];  Service: Urology;  Laterality: Bilateral;  . CYSTOSCOPY W/ URETERAL STENT PLACEMENT  07/08/2012   Procedure: CYSTOSCOPY WITH STENT REPLACEMENT;  Surgeon: Molli Hazard, MD;  Location: [facility];  Service: Urology;  Laterality: Left;  . CYSTOSCOPY W/ URETERAL STENT REMOVAL  07/08/2012   Procedure: CYSTOSCOPY WITH STENT REMOVAL;  Surgeon: Molli Hazard, MD;  Location: [facility];  Service: Urology;  Laterality: Bilateral;  . CYSTOSCOPY/RETROGRADE/URETEROSCOPY  07/08/2012   Procedure: CYSTOSCOPY/RETROGRADE/URETEROSCOPY;  Surgeon: Molli Hazard, MD;  Location: [facility];  Service: Urology;  Laterality: Left;  . ESOPHAGOGASTRODUODENOSCOPY    . HERNIA REPAIR  1989   bilateral  . IMPLANTABLE CARDIOVERTER DEFIBRILLATOR (ICD) GENERATOR CHANGE N/A 09/20/2013   Procedure: ICD GENERATOR CHANGE;  Surgeon: Deboraha Sprang, MD;  Location: [facility] C[facility] LAB;  Service: Cardiovascular;  Laterality: N/A;  . TRANSURETHRAL RESECTION OF BLADDER TUMOR  05/25/2012   cold cup biopsy prostate  . TRANSURETHRAL RESECTION OF BLADDER TUMOR  07/08/2012   Procedure: TRANSURETHRAL RESECTION OF BLADDER TUMOR (TURBT);  Surgeon: Molli Hazard, MD;  Location: [facility];  Service: Urology;  Laterality: N/A;  CYSTO, TURBT W/ GYRUS, BILATERAL STENT REMOVAL, LEFT URETEROSCOPY WITH BIOPSY, POSSIBLE LEFT  STENT PLACEMENT     Social History   Social History  . Marital status: Married    Spouse name: N/A  . Number of children: N/A  . Years of education: N/A   Occupational History  . retired    Social History Main Topics  . Smoking status: Former Smoker    Types: Cigarettes, Cigars    Quit date: 03/02/2004  . Smokeless tobacco: Former Systems developer    Quit date: 05/20/1999     Comment: quit in 2005  . Alcohol use No  . Drug use: No  . Sexual activity: Not on file   Other Topics Concern  . Not on file   Social History Narrative  . No narrative on file    Mobility: Has cane available but does not utilize Work history: Not obtained   Allergies  Allergen Reactions  . Ramipril Other (See Comments)    cough  . Amoxicillin Rash  . Penicillins Rash    Family History  Problem Relation Age of Onset  . Coronary artery disease    . Stroke       Prior to Admission medications   Medication Sig Start Date End Date Taking? Authorizing Provider  acetaminophen (TYLENOL) 325 MG tablet Take 2 tablets (650 mg total)  by mouth every 6 (six) hours as needed for mild pain (or Fever >/= 101). 08/18/13  Yes [name], MD  amiodarone (PACERONE) 100 MG tablet Take 1 tablet (100 mg total) by mouth daily. 11/03/14  Yes Jolaine Artist, MD  benzonatate (TESSALON) 200 MG capsule Take 200 mg by mouth 3 (three) times daily as needed for cough.   Yes Historical Provider, MD  carvedilol (COREG) 6.25 MG tablet TAKE ONE TABLET BY MOUTH TWICE DAILY WITH MEALS 06/21/16  Yes Shaune Pascal [facility], MD  diphenhydramine-acetaminophen (TYLENOL PM) 25-500 MG TABS tablet Take 1 tablet by mouth at bedtime as needed (sleep).   Yes Historical Provider, MD  ELIQUIS 2.5 MG TABS tablet TAKE ONE TABLET BY MOUTH TWICE DAILY 06/21/16  Yes Jolaine Artist, MD  ezetimibe (ZETIA) 10 MG tablet TAKE ONE TABLET BY MOUTH EVERY DAY WITH  SUPPER 12/27/15  Yes Jolaine Artist, MD  ferrous sulfate ([facility] IRON) 325 (65 FE) MG tablet Take 325  mg by mouth every Monday, Wednesday, and Friday.    Yes Historical Provider, MD  furosemide (LASIX) 20 MG tablet Take 20 mg by mouth daily.  08/21/13  Yes Historical Provider, MD  hydrOXYzine (ATARAX/VISTARIL) 25 MG tablet Take 1 tablet by mouth at bedtime. Reported on 10/19/2015 11/16/13  Yes Historical Provider, MD  KLOR-CON M10 10 MEQ tablet Take 10 mEq by mouth daily.  06/26/13  Yes Historical Provider, MD  levofloxacin (LEVAQUIN) 250 MG tablet Take 250 mg by mouth daily.   Yes Historical Provider, MD  levothyroxine (SYNTHROID, LEVOTHROID) 50 MCG tablet Take 25-50 mcg by mouth daily before breakfast. Take 50 mcg (1 tablet) on Tues, Thurs, Sat, Sun and 25 mcg (1/2 tablet) on Mon, Wed, Fri   Yes Historical Provider, MD  Magnesium Hydroxide (MILK OF MAGNESIA PO) Take 20 mLs by mouth daily as needed (for constipation).   Yes Historical Provider, MD  Multiple Vitamins-Minerals (CENTRUM SILVER PO) Take 1 tablet by mouth daily.    Yes Historical Provider, MD  NITROSTAT 0.4 MG SL tablet PLACE ONE TABLET UNDER THE TONGUE EVERY 5 MINUTES AS NEEDED FOR CHEST PAIN 12/19/15  Yes Shaune Pascal [facility], MD  pantoprazole (PROTONIX) 40 MG tablet Take 40 mg by mouth daily.  01/15/11  Yes Historical Provider, MD  Pumpkin Seed-Soy Germ (AZO BLADDER CONTROL/GO-LESS) CAPS Take 1 capsule by mouth as needed (for bladder incontinence).    Yes Historical Provider, MD  rosuvastatin (CRESTOR) 20 MG tablet Take 1 tablet (20 mg total) by mouth daily. 10/18/15  Yes Shaune Pascal [facility], MD  sodium chloride (OCEAN) 0.65 % SOLN nasal spray Place 1 spray into both nostrils as needed for congestion.   Yes Historical Provider, MD  tamsulosin (FLOMAX) 0.4 MG CAPS capsule Take 1 capsule by mouth at bedtime. 11/23/13  Yes Historical Provider, MD  valsartan (DIOVAN) 80 MG tablet TAKE ONE-HALF TABLET BY MOUTH ONCE DAILY ONCE IF SYSTOLIC BLOOD PRESSURE IS GREATER THAN 100 01/24/16  Yes Jolaine Artist, MD    Physical Exam: Vitals:   09/10/16 1400  09/10/16 1415 09/10/16 1430 09/10/16 1445  BP: 124/67 116/67 105/80 122/73  Pulse: 65 68 74 67  Resp: '18 17 22 15  ' Temp:      TempSrc:      SpO2: 90% (!) 88% 97% 92%  Weight:      Height:          Constitutional: NAD, uncomfortable secondary to paroxysmal coughing episode with attempts to vocalize Eyes: PERRL, lids and conjunctivae  normal ENMT: Mucous membranes are moist. Posterior pharynx clear of any exudate or lesions.Normal dentition.  Neck: normal, supple, no masses, no thyromegaly Respiratory: Bilateral crackles with expiratory wheezing with wet sounding paroxysmal cough. Increase respiratory rate with mild increased work of breathing but no accessory muscle use. Nasal cannula oxygen Cardiovascular: Regular rate and rhythm, no murmurs / rubs / gallops. No extremity edema. 2+ pedal pulses. No carotid bruits.  Abdomen: no tenderness, no masses palpated. No hepatosplenomegaly. Bowel sounds positive.  Musculoskeletal: no clubbing / cyanosis. No joint deformity upper and lower extremities. Good ROM, no contractures. Normal muscle tone.  Skin: no rashes, lesions, ulcers. No induration Neurologic: CN 2-12 grossly intact. Sensation intact, DTR normal. Strength 5/5 x all 4 extremities.  Psychiatric: Normal judgment and insight. Alert and oriented x 3. Normal mood.    Labs on Admission: I have personally reviewed following labs and imaging studies  CBC:  Recent Labs Lab 09/10/16 1120  WBC 7.6  NEUTROABS 4.5  HGB 13.0  HCT 39.7  MCV 92.3  PLT 109   Basic Metabolic Panel:  Recent Labs Lab 09/10/16 1120  NA 138  K 4.4  CL 104  CO2 22  GLUCOSE 102*  BUN 33*  CREATININE 2.93*  CALCIUM 9.0   GFR: Estimated Creatinine Clearance: 20.1 mL/min (by C-G formula based on SCr of 2.93 mg/dL (H)). Liver Function Tests: No results for input(s): AST, ALT, ALKPHOS, BILITOT, PROT, ALBUMIN in the last 168 hours. No results for input(s): LIPASE, AMYLASE in the last 168 hours. No  results for input(s): AMMONIA in the last 168 hours. Coagulation Profile:  Recent Labs Lab 09/10/16 1120  INR 1.54   Cardiac Enzymes: No results for input(s): CKTOTAL, CKMB, CKMBINDEX, TROPONINI in the last 168 hours. BNP (last 3 results) No results for input(s): PROBNP in the last 8760 hours. HbA1C: No results for input(s): HGBA1C in the last 72 hours. CBG: No results for input(s): GLUCAP in the last 168 hours. Lipid Profile: No results for input(s): CHOL, HDL, LDLCALC, TRIG, CHOLHDL, LDLDIRECT in the last 72 hours. Thyroid Function Tests: No results for input(s): TSH, T4TOTAL, FREET4, T3FREE, THYROIDAB in the last 72 hours. Anemia Panel: No results for input(s): VITAMINB12, FOLATE, FERRITIN, TIBC, IRON, RETICCTPCT in the last 72 hours. Urine analysis:    Component Value Date/Time   COLORURINE YELLOW 08/15/2013 1132   APPEARANCEUR CLOUDY (A) 08/15/2013 1132   LABSPEC 1.026 08/15/2013 1132   PHURINE 5.5 08/15/2013 1132   GLUCOSEU NEGATIVE 08/15/2013 1132   HGBUR LARGE (A) 08/15/2013 1132   BILIRUBINUR NEGATIVE 08/15/2013 1132   KETONESUR NEGATIVE 08/15/2013 1132   PROTEINUR 100 (A) 08/15/2013 1132   UROBILINOGEN 0.2 08/15/2013 1132   NITRITE NEGATIVE 08/15/2013 1132   LEUKOCYTESUR SMALL (A) 08/15/2013 1132   Sepsis Labs: '@LABRCNTIP' (procalcitonin:4,lacticidven:4) )No results found for this or any previous visit (from the past 240 hour(s)).   Radiological Exams on Admission: Dg Chest 2 View  Result Date: 09/10/2016 CLINICAL DATA:  Shortness of breast since Thursday, rales in upper lobes, crackles in BILATERAL lungs, rib pain with cough, history CHF, coronary artery disease EXAM: CHEST  2 VIEW COMPARISON:  08/16/2013, 02/18/2013 FINDINGS: LEFT subclavian AICD with leads projecting at RIGHT atrium, RIGHT ventricle and coronary sinus unchanged. Enlargement of cardiac silhouette. Atherosclerotic calcification aorta. Mediastinal contours and pulmonary vascularity normal.  Hyperinflation consistent with COPD. Nodular density lateral mid RIGHT lung stable since 2014. Asymmetric biapical scarring RIGHT greater than LEFT. No acute infiltrate, pleural effusion or pneumothorax. Pulmonary edema seen on  previous exam resolved. Bones demineralized. IMPRESSION: Mild enlargement cardiac silhouette post AICD placement. Aortic atherosclerosis. COPD changes with biapical scarring. No acute infiltrate. Electronically Signed   By: Lavonia Dana M.D.   On: 09/10/2016 12:10    EKG: (Independently reviewed) sinus rhythm although appears to be atrial pacing, ventricular rate 64 bpm, QTC 540 ms in setting of underlying right bundle branch block, no definitive ischemic changes and no change from previous EKG  Assessment/Plan Principal Problem:   Acute respiratory failure with hypoxia  -Presents with 1 week of upper respiratory infection symptoms associated with cough and increased sputum production; suspect acute viral illness precipitating bronchitis -On Levaquin prior to admission; begin Rocephin and Zithromax IV -Continue supportive care with oxygen -Treat underlying causes (see below) -If does not respond to presumptive treatment for infection and COPD exacerbation consider early presentation of amiodarone-induced pulmonary toxicity  Active Problems:   Acute bronchitis with COPD  -History of prior tobacco abuse-stopped smoking in 2005 2005 -No fevers or chills and has white sputum production, no focal infiltrate on chest x-ray so treat presumptively as COPD bronchitis -Wheezing has decreased with nebulizer treatment so we'll continue scheduled DuoNeb's -Again Solu-Medrol 60 mg IV every 6 hours and hopefully can taper to prednisone in a.m. -Incentive spirometry -Supportive care with oxygen -States received influenza vaccine but as per cost and check respiratory viral panel-EDP obtained influenza PCR -Chek Procalcitonin and ESR -Obtain blood cultures and sputum culture -Check  urinary wrap -As noted above, if does not respond to above treatments consider amiodarone-induced pulmonary toxicity -Tessalon Perles as well as desonide nebulizers for paroxysmal coughing -Repeat 2 view chest x-ray in a.m.    Essential hypertension, benign -Continue preadmission medications (Coreg-takes Flomax for BPH)    Ischemic cardiomyopathy/ Chronic systolic CHF (congestive heart failure) (HCC)-EF 25% -Patient's weight is stable, BNP only mildly elevated just above 200, no peripheral edema therefore presenting symptoms not consistent with acute heart failure exacerbation -Continue preadmission Coreg and Lasix -Daily weights and strict intake and output    Atrial fibrillation  -Currently maintaining sinus rhythm on amiodarone -Continue eliquis -CHADVASC=5      CKD (chronic kidney disease) stage 4, GFR 15-29 ml/min  -Renal function stable and at baseline of dirty 3 and 2.93 -Avoid offending medications    Mixed hyperlipidemia -Continue Zetia and Crestor    CAD, NATIVE VESSEL -Asymptomatic -Continue statin, beta blocker -No aspirin-on eliquis    ICD (implantable cardioverter-defibrillator) in place (St Jude pacer/ICD)      DVT prophylaxis: Eliquis Code Status: Full Family Communication: Wife at bedside Disposition Plan: Anticipate discharge back preadmission home environment when medically stable Consults called: None    Rielle Schlauch L. ANP-BC [facility] Pager [phone]   If 7PM-7AM, please contact night-coverage [email or web address] Password [email or web address]  09/10/2016, 2:52 PM

## 2016-09-11 ENCOUNTER — Encounter (HOSPITAL_COMMUNITY): Payer: Medicare Other

## 2016-09-11 ENCOUNTER — Observation Stay (HOSPITAL_COMMUNITY): Payer: Medicare Other

## 2016-09-11 DIAGNOSIS — E039 Hypothyroidism, unspecified: Secondary | ICD-10-CM | POA: Diagnosis present

## 2016-09-11 DIAGNOSIS — I255 Ischemic cardiomyopathy: Secondary | ICD-10-CM | POA: Diagnosis present

## 2016-09-11 DIAGNOSIS — Z9861 Coronary angioplasty status: Secondary | ICD-10-CM | POA: Diagnosis not present

## 2016-09-11 DIAGNOSIS — Z7901 Long term (current) use of anticoagulants: Secondary | ICD-10-CM | POA: Diagnosis not present

## 2016-09-11 DIAGNOSIS — G250 Essential tremor: Secondary | ICD-10-CM | POA: Diagnosis present

## 2016-09-11 DIAGNOSIS — I252 Old myocardial infarction: Secondary | ICD-10-CM | POA: Diagnosis not present

## 2016-09-11 DIAGNOSIS — K5900 Constipation, unspecified: Secondary | ICD-10-CM | POA: Diagnosis not present

## 2016-09-11 DIAGNOSIS — J9601 Acute respiratory failure with hypoxia: Secondary | ICD-10-CM | POA: Diagnosis not present

## 2016-09-11 DIAGNOSIS — E782 Mixed hyperlipidemia: Secondary | ICD-10-CM | POA: Diagnosis present

## 2016-09-11 DIAGNOSIS — I5022 Chronic systolic (congestive) heart failure: Secondary | ICD-10-CM | POA: Diagnosis present

## 2016-09-11 DIAGNOSIS — J209 Acute bronchitis, unspecified: Secondary | ICD-10-CM | POA: Diagnosis not present

## 2016-09-11 DIAGNOSIS — Z79899 Other long term (current) drug therapy: Secondary | ICD-10-CM | POA: Diagnosis not present

## 2016-09-11 DIAGNOSIS — Z8551 Personal history of malignant neoplasm of bladder: Secondary | ICD-10-CM | POA: Diagnosis not present

## 2016-09-11 DIAGNOSIS — K219 Gastro-esophageal reflux disease without esophagitis: Secondary | ICD-10-CM | POA: Diagnosis present

## 2016-09-11 DIAGNOSIS — J210 Acute bronchiolitis due to respiratory syncytial virus: Secondary | ICD-10-CM | POA: Diagnosis present

## 2016-09-11 DIAGNOSIS — J440 Chronic obstructive pulmonary disease with acute lower respiratory infection: Secondary | ICD-10-CM | POA: Diagnosis present

## 2016-09-11 DIAGNOSIS — Z95810 Presence of automatic (implantable) cardiac defibrillator: Secondary | ICD-10-CM | POA: Diagnosis not present

## 2016-09-11 DIAGNOSIS — Z87891 Personal history of nicotine dependence: Secondary | ICD-10-CM | POA: Diagnosis not present

## 2016-09-11 DIAGNOSIS — Z880 Allergy status to penicillin: Secondary | ICD-10-CM | POA: Diagnosis not present

## 2016-09-11 DIAGNOSIS — N400 Enlarged prostate without lower urinary tract symptoms: Secondary | ICD-10-CM | POA: Diagnosis present

## 2016-09-11 DIAGNOSIS — I4891 Unspecified atrial fibrillation: Secondary | ICD-10-CM | POA: Diagnosis present

## 2016-09-11 DIAGNOSIS — I130 Hypertensive heart and chronic kidney disease with heart failure and stage 1 through stage 4 chronic kidney disease, or unspecified chronic kidney disease: Secondary | ICD-10-CM | POA: Diagnosis present

## 2016-09-11 DIAGNOSIS — G4730 Sleep apnea, unspecified: Secondary | ICD-10-CM | POA: Diagnosis present

## 2016-09-11 DIAGNOSIS — I2510 Atherosclerotic heart disease of native coronary artery without angina pectoris: Secondary | ICD-10-CM | POA: Diagnosis present

## 2016-09-11 DIAGNOSIS — N184 Chronic kidney disease, stage 4 (severe): Secondary | ICD-10-CM | POA: Diagnosis present

## 2016-09-11 LAB — COMPREHENSIVE METABOLIC PANEL
ALT: 19 U/L (ref 17–63)
ANION GAP: 11 (ref 5–15)
AST: 42 U/L — ABNORMAL HIGH (ref 15–41)
Albumin: 3.3 g/dL — ABNORMAL LOW (ref 3.5–5.0)
Alkaline Phosphatase: 49 U/L (ref 38–126)
BUN: 38 mg/dL — ABNORMAL HIGH (ref 6–20)
CO2: 23 mmol/L (ref 22–32)
Calcium: 9.1 mg/dL (ref 8.9–10.3)
Chloride: 105 mmol/L (ref 101–111)
Creatinine, Ser: 2.89 mg/dL — ABNORMAL HIGH (ref 0.61–1.24)
GFR, EST AFRICAN AMERICAN: 22 mL/min — AB (ref >60)
GFR, EST NON AFRICAN AMERICAN: 19 mL/min — AB (ref >60)
Glucose, Bld: 162 mg/dL — ABNORMAL HIGH (ref 65–99)
Potassium: 3.8 mmol/L (ref 3.5–5.1)
SODIUM: 139 mmol/L (ref 135–145)
Total Bilirubin: 0.7 mg/dL (ref 0.3–1.2)
Total Protein: 6.5 g/dL (ref 6.5–8.1)

## 2016-09-11 LAB — CBC
HCT: 36.6 % — ABNORMAL LOW (ref 39.0–52.0)
HEMOGLOBIN: 12 g/dL — AB (ref 13.0–17.0)
MCH: 30 pg (ref 26.0–34.0)
MCHC: 32.8 g/dL (ref 30.0–36.0)
MCV: 91.5 fL (ref 78.0–100.0)
Platelets: 167 10*3/uL (ref 150–400)
RBC: 4 MIL/uL — AB (ref 4.22–5.81)
RDW: 13.4 % (ref 11.5–15.5)
WBC: 6.5 10*3/uL (ref 4.0–10.5)

## 2016-09-11 MED ORDER — MAGNESIUM CITRATE PO SOLN
1.0000 | Freq: Once | ORAL | Status: AC
  Administered 2016-09-11: 1 via ORAL
  Filled 2016-09-11: qty 296

## 2016-09-11 MED ORDER — FUROSEMIDE 10 MG/ML IJ SOLN
20.0000 mg | Freq: Once | INTRAMUSCULAR | Status: AC
  Administered 2016-09-11: 20 mg via INTRAVENOUS
  Filled 2016-09-11: qty 2

## 2016-09-11 MED ORDER — IPRATROPIUM-ALBUTEROL 0.5-2.5 (3) MG/3ML IN SOLN
3.0000 mL | Freq: Two times a day (BID) | RESPIRATORY_TRACT | Status: DC
  Administered 2016-09-11 – 2016-09-13 (×4): 3 mL via RESPIRATORY_TRACT
  Filled 2016-09-11 (×4): qty 3

## 2016-09-11 MED ORDER — AZITHROMYCIN 500 MG PO TABS
250.0000 mg | ORAL_TABLET | Freq: Every day | ORAL | Status: DC
  Administered 2016-09-12 – 2016-09-13 (×2): 250 mg via ORAL
  Filled 2016-09-11 (×2): qty 1

## 2016-09-11 MED ORDER — PREDNISONE 20 MG PO TABS
40.0000 mg | ORAL_TABLET | Freq: Every day | ORAL | Status: DC
  Administered 2016-09-12 – 2016-09-13 (×2): 40 mg via ORAL
  Filled 2016-09-11 (×2): qty 2

## 2016-09-11 MED ORDER — MAGNESIUM SULFATE 2 GM/50ML IV SOLN
2.0000 g | Freq: Once | INTRAVENOUS | Status: DC
  Filled 2016-09-11: qty 50

## 2016-09-11 NOTE — Progress Notes (Signed)
Pt did not have BM since 09/05/16. Dr. Bonner Puna notified at [phone].

## 2016-09-11 NOTE — Care Management Note (Addendum)
Case Management Note  Patient Details  Name: [name] MRN: [id number] Date of Birth: 1931/10/21  Subjective/Objective:           Patient in obs for respiratory distress. Independent lives at home with wife, has walker available for use at home if needed. Currently on supplemental oxygen, non dependent at home.          Action/Plan:  Watch for home oxygen needs/ HH needs if new Dx COPD, consider [facility] care coordination for COPD. Referral placed to [facility].  HHPT through [facility], referral placed, O2 through [facility] will be delivered to room prior to discharge.  Expected Discharge Date:                  Expected Discharge Plan:  [place]  In-House Referral:     Discharge planning Services  CM Consult  Post Acute Care Choice:    Choice offered to:     DME Arranged:    DME Agency:     HH Arranged:    [place] Agency:     Status of Service:  In process, will continue to follow  If discussed at Long Length of Stay Meetings, dates discussed:    Additional Comments:  Carles Collet, RN 09/11/2016, 12:54 PM

## 2016-09-11 NOTE — Consult Note (Signed)
   [name] CM Inpatient Consult   09/11/2016  CLARANCE BOLLARD 1932/04/23 [id number]  Patient screened for potential [place] Management services for COPD/Pneumonia post hospital follow up.  Currently under observation status.   Patient is eligible for [facility] Care Management services under patient's Medicare plan. Met with the patient and his wife at the bedside regarding services.  Patient and wife states we thought we were getting phone calls. Explained [facility] COPD/Pneumonia follow up automated calls and if a live nurse is called it would be the telephonic care managers.  [facility] will follow up with the [facility] calls. Order updated.  Patient did not want home visit.   For questions contact:    Natividad Brood, RN BSN [facility] Liaison  [phone] business mobile phone Toll free office [phone]

## 2016-09-11 NOTE — Progress Notes (Addendum)
PROGRESS NOTE  [name]  WNI:[id number] DOB: 1932-07-27 DOA: 09/10/2016 PCP: [name] Reel, MD   Brief Narrative: [name] is a 82 y.o. male with a history of tobacco use quit 2005, CAD w/ICM and chronic systolic CHF (FG18% followed by [name]) s/p PPM/ICD, AFib on amiodarone and eliquis, CKD stage IV, hypothyroidism. Patient presents to ER from PCP office after failed outpatient therapy with Levaquin for upper respiratory infection symptoms associated with fatigue and productive cough. RA saturations at PCP office 80%. Symptoms been ongoing since last Thursday he worsened. He reports postnasal drip precipitating paroxysmal coughing episodes. Increased fatigue. Denies fevers chills or diarrhea. States did receive influenza vaccine this year. Does not utilize oxygen at home. No formal diagnosis of COPD.  ED Course:  Vital Signs: BP 122/73   Pulse 67   Temp 98.9 F (37.2 C) (Oral)   Resp 15   Ht 5\' 10"  (1.778 m)   Wt 82.6 kg (182 lb)   SpO2 92%   BMI 26.11 kg/m  2 view chest x-ray: Mild enlargement cardiac silhouette, COPD changes with biapical scarring, no acute infiltrate or edema. Lab data: Sodium 138, potassium 4.4, CO2 22, BUN 33, creatinine 2.93, glucose 102, BNP 288, poc troponin 0.01, white count 7600 with normal differential, hemoglobin 13, platelets 182,000, PT 18.6, INR 1.54 Medications and treatments: Aspirin 324 mg 1, DuoNeb 2   Assessment & Plan: Principal Problem:   Acute respiratory failure with hypoxia (HCC) Active Problems:   Mixed hyperlipidemia   Essential hypertension, benign   CAD, NATIVE VESSEL   Ischemic cardiomyopathy   Atrial fibrillation (HCC)   Chronic systolic CHF (congestive heart failure) (HCC)-EF 25%   ICD (implantable cardioverter-defibrillator) in place (St Jude pacer/ICD)   CKD (chronic kidney disease) stage 4, GFR 15-29 ml/min (HCC)   Acute bronchitis with COPD (HCC)  Acute respiratory failure with hypoxia: RSV and  rhinoviral infections precipitated acute bronchitis.  - Continue O2 prn, weaning to room air prior to discharge - Treat underlying causes (see below) - Incentive spirometry, OOB, PT  Acute bronchitis with COPD: ~1/4 pack per week x50 years and wheezing suggestive of COPD - Follow up CXR with possible developing infiltrate. On Levaquin PTA > ceftriaxone and azithromycin IV > PCT neg, Strep Ag neg > switch to azithromycin po today x 3 days (11/30 - 12/2) - Continue scheduled Duonebs - Taper to prednisone in AM x 3 days (11/30 - 12/2) - Monitor blood cultures and sputum culture - Outpatient PFTs: if does not respond to above treatments consider amiodarone-induced pulmonary toxicity  Essential hypertension, benign - Continue preadmission medications (Coreg-takes Flomax for BPH)  ICM and chronic systolic CHF (EF 29%): Patient's weight is stable, BNP equivocally elevated at 287. No peripheral edema but +crackles on exam. Doubt acute HF is primary cause of symptoms, but will add single IV lasix dose today.  - Continue preadmission coreg, statin, and lasix - Daily weights and strict intake and output - Monitor BMP in AM  Atrial fibrillation CHADVASC=5. Currently maintaining sinus rhythm on amiodarone: May need to consider toxicity if COPD Dx not confirmed. - Continue eliquis, amiodarone    Stage IV CKD: Renal function stable at baseline (SCr 2.93) - Avoid offending medications - Eliquis 2.5mg  BID  Mixed hyperlipidemia - Continue Zetia and Crestor  Constipation: Likely related to very poor po intake. - Magnesium citrate x1, continue diet.  DVT prophylaxis: Eliquis Code Status: Full Family Communication: Wife and SIL at bedside Disposition Plan: Transition to po  abx and steroids today. If able to wean oxygen, can DC in next 24 hours.  Consultants:   None  Procedures:   None  Antimicrobials:  CTX 11/28 > 11/29  Azithromycin 11/28 > 12/2   Subjective: Pt feels better, still  dyspneic on exertion with wheezing. Ate better today and slept better last night than in the past week.   Objective: Vitals:   09/11/16 0731 09/11/16 1135 09/11/16 1500 09/11/16 1542  BP: 115/69 (!) 110/50 135/71   Pulse: 62 64 64   Resp: 18 18 18    Temp: 97.7 F (36.5 C) 97.6 F (36.4 C) 97.4 F (36.3 C) 99.9 F (37.7 C)  TempSrc: Oral Oral Oral   SpO2: 96% 95% 95%   Weight:      Height:        Intake/Output Summary (Last 24 hours) at 09/11/16 1609 Last data filed at 09/11/16 0740  Gross per 24 hour  Intake              300 ml  Output              795 ml  Net             -495 ml   Filed Weights   09/10/16 1119 09/10/16 1640  Weight: 82.6 kg (182 lb) 82.9 kg (182 lb 12.2 oz)    Examination: General exam: 82 y.o. male in no distress Respiratory system: Non-labored breathing 2L O2. Bibasilar crackles and end-expiratory wheezing/rhonchi throughout. Cardiovascular system: Regular rate and rhythm. No murmur, rub, or gallop. No JVD, and no pedal edema. Gastrointestinal system: Abdomen soft, non-tender, non-distended, with normoactive bowel sounds. No organomegaly or masses felt. Central nervous system: Alert and oriented. No focal neurological deficits. Extremities: Warm, no deformities Skin: No rashes, lesions no ulcers Psychiatry: Judgement and insight appear normal. Mood & affect appropriate.   Data Reviewed: I have personally reviewed following labs and imaging studies  CBC:  Recent Labs Lab 09/10/16 1120 09/11/16 0611  WBC 7.6 6.5  NEUTROABS 4.5  --   HGB 13.0 12.0*  HCT 39.7 36.6*  MCV 92.3 91.5  PLT 182 456   Basic Metabolic Panel:  Recent Labs Lab 09/10/16 1120 09/10/16 1624 09/11/16 0611  NA 138  --  139  K 4.4  --  3.8  CL 104  --  105  CO2 22  --  23  GLUCOSE 102*  --  162*  BUN 33*  --  38*  CREATININE 2.93*  --  2.89*  CALCIUM 9.0  --  9.1  MG  --  2.5*  --   PHOS  --  3.3  --    GFR: Estimated Creatinine Clearance: 20.3 mL/min (by C-G  formula based on SCr of 2.89 mg/dL (H)). Liver Function Tests:  Recent Labs Lab 09/11/16 0611  AST 42*  ALT 19  ALKPHOS 49  BILITOT 0.7  PROT 6.5  ALBUMIN 3.3*   No results for input(s): LIPASE, AMYLASE in the last 168 hours. No results for input(s): AMMONIA in the last 168 hours. Coagulation Profile:  Recent Labs Lab 09/10/16 1120  INR 1.54   Cardiac Enzymes: No results for input(s): CKTOTAL, CKMB, CKMBINDEX, TROPONINI in the last 168 hours. BNP (last 3 results) No results for input(s): PROBNP in the last 8760 hours. HbA1C: No results for input(s): HGBA1C in the last 72 hours. CBG: No results for input(s): GLUCAP in the last 168 hours. Lipid Profile: No results for input(s): CHOL, HDL, LDLCALC, TRIG, CHOLHDL,  LDLDIRECT in the last 72 hours. Thyroid Function Tests: No results for input(s): TSH, T4TOTAL, FREET4, T3FREE, THYROIDAB in the last 72 hours. Anemia Panel: No results for input(s): VITAMINB12, FOLATE, FERRITIN, TIBC, IRON, RETICCTPCT in the last 72 hours. Urine analysis:    Component Value Date/Time   COLORURINE YELLOW 08/15/2013 1132   APPEARANCEUR CLOUDY (A) 08/15/2013 1132   LABSPEC 1.026 08/15/2013 1132   PHURINE 5.5 08/15/2013 1132   GLUCOSEU NEGATIVE 08/15/2013 1132   HGBUR LARGE (A) 08/15/2013 1132   BILIRUBINUR NEGATIVE 08/15/2013 1132   KETONESUR NEGATIVE 08/15/2013 1132   PROTEINUR 100 (A) 08/15/2013 1132   UROBILINOGEN 0.2 08/15/2013 1132   NITRITE NEGATIVE 08/15/2013 1132   LEUKOCYTESUR SMALL (A) 08/15/2013 1132   Sepsis Labs: @LABRCNTIP (procalcitonin:4,lacticidven:4)  ) Recent Results (from the past 240 hour(s))  Culture, blood (routine x 2) Call MD if unable to obtain prior to antibiotics being given     Status: None (Preliminary result)   Collection Time: 09/10/16  3:05 PM  Result Value Ref Range Status   Specimen Description BLOOD LEFT ANTECUBITAL  Final   Special Requests BOTTLES DRAWN AEROBIC AND ANAEROBIC 5CC  Final   Culture NO  GROWTH < 24 HOURS  Final   Report Status PENDING  Incomplete  Culture, blood (routine x 2) Call MD if unable to obtain prior to antibiotics being given     Status: None (Preliminary result)   Collection Time: 09/10/16  3:23 PM  Result Value Ref Range Status   Specimen Description BLOOD RIGHT FOREARM  Final   Special Requests BOTTLES DRAWN AEROBIC ONLY 5CC  Final   Culture NO GROWTH < 24 HOURS  Final   Report Status PENDING  Incomplete  Respiratory Panel by PCR     Status: Abnormal   Collection Time: 09/10/16  3:50 PM  Result Value Ref Range Status   Adenovirus NOT DETECTED NOT DETECTED Final   Coronavirus 229E NOT DETECTED NOT DETECTED Final   Coronavirus HKU1 NOT DETECTED NOT DETECTED Final   Coronavirus NL63 NOT DETECTED NOT DETECTED Final   Coronavirus OC43 NOT DETECTED NOT DETECTED Final   Metapneumovirus NOT DETECTED NOT DETECTED Final   Rhinovirus / Enterovirus DETECTED (A) NOT DETECTED Final   Influenza A NOT DETECTED NOT DETECTED Final   Influenza B NOT DETECTED NOT DETECTED Final   Parainfluenza Virus 1 NOT DETECTED NOT DETECTED Final   Parainfluenza Virus 2 NOT DETECTED NOT DETECTED Final   Parainfluenza Virus 3 NOT DETECTED NOT DETECTED Final   Parainfluenza Virus 4 NOT DETECTED NOT DETECTED Final   Respiratory Syncytial Virus DETECTED (A) NOT DETECTED Final    Comment: CRITICAL RESULT CALLED TO, READ BACK BY AND VERIFIED WITH: S. WARD RN 17:35 09/09/16 ([email or web address])    Bordetella pertussis NOT DETECTED NOT DETECTED Final   Chlamydophila pneumoniae NOT DETECTED NOT DETECTED Final   Mycoplasma pneumoniae NOT DETECTED NOT DETECTED Final     Radiology Studies: Dg Chest 2 View  Result Date: 09/11/2016 CLINICAL DATA:  Acute bronchitis with COPD, history CHF, coronary artery disease EXAM: CHEST  2 VIEW COMPARISON:  Lung 06/02/2016 FINDINGS: LEFT subclavian AICD with leads projecting at RIGHT atrium, RIGHT ventricle, and coronary sinus, unchanged. Enlargement of cardiac  silhouette. Atherosclerotic calcification aorta. Mediastinal contours and pulmonary vascularity normal. Bronchitic changes with asymmetric biapical scarring greater on RIGHT. Slightly increased opacity in RIGHT upper lobe question developing infiltrate or atelectasis. Remaining lungs grossly clear. No pleural effusion pneumothorax. IMPRESSION: Enlargement of cardiac silhouette post AICD. Hazy increased opacity in  RIGHT upper lobe and at medial RIGHT lung base question developing infiltrate versus atelectasis. Electronically Signed   By: Lavonia Dana M.D.   On: 09/11/2016 08:04   Dg Chest 2 View  Result Date: 09/10/2016 CLINICAL DATA:  Shortness of breast since Thursday, rales in upper lobes, crackles in BILATERAL lungs, rib pain with cough, history CHF, coronary artery disease EXAM: CHEST  2 VIEW COMPARISON:  08/16/2013, 02/18/2013 FINDINGS: LEFT subclavian AICD with leads projecting at RIGHT atrium, RIGHT ventricle and coronary sinus unchanged. Enlargement of cardiac silhouette. Atherosclerotic calcification aorta. Mediastinal contours and pulmonary vascularity normal. Hyperinflation consistent with COPD. Nodular density lateral mid RIGHT lung stable since 2014. Asymmetric biapical scarring RIGHT greater than LEFT. No acute infiltrate, pleural effusion or pneumothorax. Pulmonary edema seen on previous exam resolved. Bones demineralized. IMPRESSION: Mild enlargement cardiac silhouette post AICD placement. Aortic atherosclerosis. COPD changes with biapical scarring. No acute infiltrate. Electronically Signed   By: Lavonia Dana M.D.   On: 09/10/2016 12:10    Scheduled Meds: . amiodarone  100 mg Oral Daily  . apixaban  2.5 mg Oral BID  . azithromycin  500 mg Intravenous Q24H  . benzonatate  200 mg Oral TID  . budesonide (PULMICORT) nebulizer solution  0.25 mg Nebulization BID  . carvedilol  6.25 mg Oral BID WC  . cefTRIAXone (ROCEPHIN)  IV  1 g Intravenous Q24H  . ezetimibe  10 mg Oral Daily  . ferrous  sulfate  325 mg Oral Q M,W,F  . furosemide  20 mg Oral Daily  . hydrOXYzine  25 mg Oral QHS  . ipratropium-albuterol  3 mL Nebulization BID  . levothyroxine  25 mcg Oral Once per day on Mon Wed Fri  . [START ON 09/12/2016] levothyroxine  50 mcg Oral Once per day on Sun Tue Thu Sat  . methylPREDNISolone (SOLU-MEDROL) injection  60 mg Intravenous Q6H  . pantoprazole  40 mg Oral Daily  . potassium chloride  10 mEq Oral Daily  . rosuvastatin  20 mg Oral Daily  . tamsulosin  0.4 mg Oral QHS   Continuous Infusions:   LOS: 0 days   Time spent: 25 minutes.  Vance Gather, MD [facility] Pager [phone]  If 7PM-7AM, please contact night-coverage [email or web address] Password [email or web address] 09/11/2016, 4:09 PM

## 2016-09-11 NOTE — Care Management Obs Status (Signed)
[place] NOTIFICATION   Patient Details  Name: [name] MRN: [id number] Date of Birth: 11/17/1931   Medicare Observation Status Notification Given:  Yes    Carles Collet, RN 09/11/2016, 12:53 PM

## 2016-09-12 LAB — BASIC METABOLIC PANEL
Anion gap: 9 (ref 5–15)
BUN: 57 mg/dL — AB (ref 6–20)
CALCIUM: 8.8 mg/dL — AB (ref 8.9–10.3)
CHLORIDE: 104 mmol/L (ref 101–111)
CO2: 26 mmol/L (ref 22–32)
CREATININE: 2.85 mg/dL — AB (ref 0.61–1.24)
GFR calc non Af Amer: 19 mL/min — ABNORMAL LOW (ref >60)
GFR, EST AFRICAN AMERICAN: 22 mL/min — AB (ref >60)
GLUCOSE: 158 mg/dL — AB (ref 65–99)
Potassium: 4 mmol/L (ref 3.5–5.1)
Sodium: 139 mmol/L (ref 135–145)

## 2016-09-12 LAB — CBC
HEMATOCRIT: 36.2 % — AB (ref 39.0–52.0)
HEMOGLOBIN: 11.8 g/dL — AB (ref 13.0–17.0)
MCH: 29.8 pg (ref 26.0–34.0)
MCHC: 32.6 g/dL (ref 30.0–36.0)
MCV: 91.4 fL (ref 78.0–100.0)
Platelets: 178 10*3/uL (ref 150–400)
RBC: 3.96 MIL/uL — ABNORMAL LOW (ref 4.22–5.81)
RDW: 13.6 % (ref 11.5–15.5)
WBC: 16.2 10*3/uL — ABNORMAL HIGH (ref 4.0–10.5)

## 2016-09-12 LAB — PROCALCITONIN: PROCALCITONIN: 0.1 ng/mL

## 2016-09-12 MED ORDER — SALINE SPRAY 0.65 % NA SOLN
1.0000 | NASAL | Status: DC | PRN
  Administered 2016-09-12: 1 via NASAL
  Filled 2016-09-12: qty 44

## 2016-09-12 NOTE — Discharge Instructions (Signed)

## 2016-09-12 NOTE — Progress Notes (Signed)
PROGRESS NOTE  [name]  OLM:[id number] DOB: 10/08/1932 DOA: 09/10/2016 PCP: [name], MD   Brief Narrative: [name] is a 82 y.o. male with a history of tobacco use quit 2005, CAD w/ICM and chronic systolic CHF (EF00% followed by Dr. Haroldine Laws) s/p PPM/ICD, AFib on amiodarone and eliquis, CKD stage IV, hypothyroidism. Patient presents to ER from PCP office after failed outpatient therapy with Levaquin for upper respiratory infection symptoms associated with fatigue and productive cough. RA saturations at PCP office 80%. Symptoms been ongoing since last Thursday he worsened. He reports postnasal drip precipitating paroxysmal coughing episodes. Increased fatigue. Denies fevers chills or diarrhea. States did receive influenza vaccine this year. Does not utilize oxygen at home. No formal diagnosis of COPD.  ED Course:  Vital Signs: BP 122/73   Pulse 67   Temp 98.9 F (37.2 C) (Oral)   Resp 15   Ht 5\' 10"  (1.778 m)   Wt 82.6 kg (182 lb)   SpO2 92%   BMI 26.11 kg/m  2 view chest x-ray: Mild enlargement cardiac silhouette, COPD changes with biapical scarring, no acute infiltrate or edema. Lab data: Sodium 138, potassium 4.4, CO2 22, BUN 33, creatinine 2.93, glucose 102, BNP 288, poc troponin 0.01, white count 7600 with normal differential, hemoglobin 13, platelets 182,000, PT 18.6, INR 1.54 Medications and treatments: Aspirin 324 mg 1, DuoNeb 2   Assessment & Plan: Principal Problem:   Acute respiratory failure with hypoxia (HCC) Active Problems:   Mixed hyperlipidemia   Essential hypertension, benign   CAD, NATIVE VESSEL   Ischemic cardiomyopathy   Atrial fibrillation (HCC)   Chronic systolic CHF (congestive heart failure) (HCC)-EF 25%   ICD (implantable cardioverter-defibrillator) in place (St Jude pacer/ICD)   CKD (chronic kidney disease) stage 4, GFR 15-29 ml/min (HCC)   Acute bronchitis with COPD (HCC)  Acute respiratory failure with hypoxia: RSV and  rhinoviral infections precipitated acute bronchitis/bronchiolitis.  - Continue O2 prn, weaning to room air prior to discharge. Still requiring O2, anticipate slow recovery from RSV bronchiolitis with no other interventions shown to be of benefit. Will continue monitoring hypoxemia overnight  - Treat underlying causes (see below) - Incentive spirometry, OOB, PT  Acute bronchitis with COPD: ~1/4 pack per week x50 years and wheezing suggestive of COPD - Follow up CXR with possible developing infiltrate. On Levaquin PTA > ceftriaxone and azithromycin IV > PCT neg, Strep Ag neg > switch to azithromycin po x 3 days (11/30 - 12/2) - Continue scheduled Duonebs - Taper to prednisone in AM x 3 days (11/30 - 12/2) - Monitor blood cultures and sputum culture - Outpatient PFTs: if does not respond to above treatments consider amiodarone-induced pulmonary toxicity  Essential hypertension, benign - Continue preadmission medications (Coreg-takes Flomax for BPH)  ICM and chronic systolic CHF (EF 71%): Patient's weight is stable, BNP equivocally elevated at 287. No peripheral edema but +crackles on exam.  - Doubt acute HF is primary cause of symptoms, single IV lasix dose given 11/29 with improvement in crackles/stable creatinine. Will continue to monitor on home po lasix. - Continue preadmission coreg, statin, and lasix - Daily weights and strict intake and output - Monitor BMP in AM  Atrial fibrillation CHADVASC=5. Currently maintaining sinus rhythm on amiodarone: May need to consider toxicity if COPD Dx not confirmed. - Continue eliquis, amiodarone    Stage IV CKD: Renal function stable at baseline (SCr 2.93) - Avoid offending medications - Eliquis 2.5mg  BID  Mixed hyperlipidemia - Continue Zetia and  Crestor  Constipation: Likely related to very poor po intake. - Magnesium citrate x1, continue diet.  DVT prophylaxis: Eliquis Code Status: Full Family Communication: Wife and SIL at  bedside Disposition Plan: Transitioned to po abx and steroids. Will continue to wean oxygen as able. PT evaluation today. Likely DC in next 24 hours.  Consultants:   None  Procedures:   None  Antimicrobials:  CTX 11/28 > 11/29  Azithromycin 11/28 > 12/2   Subjective: Pt feels better, still dyspneic on exertion with wheezing. Wife at bedside states he is still very weak and has reservations about sending him home. Is active in cardiopulmonary rehab.   Objective: Vitals:   09/12/16 0500 09/12/16 0539 09/12/16 0903 09/12/16 0938  BP:  (!) 102/58 120/80   Pulse:  66 70   Resp:  18    Temp:  97.7 F (36.5 C)    TempSrc:  Oral    SpO2:  92%  92%  Weight: 80.1 kg (176 lb 9.4 oz)     Height:       No intake or output data in the 24 hours ending 09/12/16 1549 Filed Weights   09/10/16 1119 09/10/16 1640 09/12/16 0500  Weight: 82.6 kg (182 lb) 82.9 kg (182 lb 12.2 oz) 80.1 kg (176 lb 9.4 oz)    Examination: General exam: 82 y.o. male in no distress Respiratory system: Non-labored breathing 2L O2. No crackles, scattered diffuse end-expiratory wheezing  Cardiovascular system: Regular rate and rhythm. No murmur, rub, or gallop. No JVD, and no pedal edema. Gastrointestinal system: Abdomen soft, non-tender, non-distended, with normoactive bowel sounds. No organomegaly or masses felt. Central nervous system: Alert and oriented. No focal neurological deficits. Extremities: Warm, no deformities Skin: No rashes, lesions no ulcers Psychiatry: Judgement and insight appear normal. Mood & affect appropriate.   Data Reviewed: I have personally reviewed following labs and imaging studies  CBC:  Recent Labs Lab 09/10/16 1120 09/11/16 0611 09/12/16 0512  WBC 7.6 6.5 16.2*  NEUTROABS 4.5  --   --   HGB 13.0 12.0* 11.8*  HCT 39.7 36.6* 36.2*  MCV 92.3 91.5 91.4  PLT 182 167 790   Basic Metabolic Panel:  Recent Labs Lab 09/10/16 1120 09/10/16 1624 09/11/16 0611 09/12/16 0512   NA 138  --  139 139  K 4.4  --  3.8 4.0  CL 104  --  105 104  CO2 22  --  23 26  GLUCOSE 102*  --  162* 158*  BUN 33*  --  38* 57*  CREATININE 2.93*  --  2.89* 2.85*  CALCIUM 9.0  --  9.1 8.8*  MG  --  2.5*  --   --   PHOS  --  3.3  --   --    GFR: Estimated Creatinine Clearance: 20.6 mL/min (by C-G formula based on SCr of 2.85 mg/dL (H)). Liver Function Tests:  Recent Labs Lab 09/11/16 0611  AST 42*  ALT 19  ALKPHOS 49  BILITOT 0.7  PROT 6.5  ALBUMIN 3.3*   No results for input(s): LIPASE, AMYLASE in the last 168 hours. No results for input(s): AMMONIA in the last 168 hours. Coagulation Profile:  Recent Labs Lab 09/10/16 1120  INR 1.54   Cardiac Enzymes: No results for input(s): CKTOTAL, CKMB, CKMBINDEX, TROPONINI in the last 168 hours. BNP (last 3 results) No results for input(s): PROBNP in the last 8760 hours. HbA1C: No results for input(s): HGBA1C in the last 72 hours. CBG: No results for  input(s): GLUCAP in the last 168 hours. Lipid Profile: No results for input(s): CHOL, HDL, LDLCALC, TRIG, CHOLHDL, LDLDIRECT in the last 72 hours. Thyroid Function Tests: No results for input(s): TSH, T4TOTAL, FREET4, T3FREE, THYROIDAB in the last 72 hours. Anemia Panel: No results for input(s): VITAMINB12, FOLATE, FERRITIN, TIBC, IRON, RETICCTPCT in the last 72 hours. Urine analysis:    Component Value Date/Time   COLORURINE YELLOW 08/15/2013 1132   APPEARANCEUR CLOUDY (A) 08/15/2013 1132   LABSPEC 1.026 08/15/2013 1132   PHURINE 5.5 08/15/2013 1132   GLUCOSEU NEGATIVE 08/15/2013 1132   HGBUR LARGE (A) 08/15/2013 1132   BILIRUBINUR NEGATIVE 08/15/2013 1132   KETONESUR NEGATIVE 08/15/2013 1132   PROTEINUR 100 (A) 08/15/2013 1132   UROBILINOGEN 0.2 08/15/2013 1132   NITRITE NEGATIVE 08/15/2013 1132   LEUKOCYTESUR SMALL (A) 08/15/2013 1132   Sepsis Labs: @LABRCNTIP (procalcitonin:4,lacticidven:4)  ) Recent Results (from the past 240 hour(s))  Culture, blood  (routine x 2) Call MD if unable to obtain prior to antibiotics being given     Status: None (Preliminary result)   Collection Time: 09/10/16  3:05 PM  Result Value Ref Range Status   Specimen Description BLOOD LEFT ANTECUBITAL  Final   Special Requests BOTTLES DRAWN AEROBIC AND ANAEROBIC 5CC  Final   Culture NO GROWTH < 24 HOURS  Final   Report Status PENDING  Incomplete  Culture, blood (routine x 2) Call MD if unable to obtain prior to antibiotics being given     Status: None (Preliminary result)   Collection Time: 09/10/16  3:23 PM  Result Value Ref Range Status   Specimen Description BLOOD RIGHT FOREARM  Final   Special Requests BOTTLES DRAWN AEROBIC ONLY 5CC  Final   Culture NO GROWTH < 24 HOURS  Final   Report Status PENDING  Incomplete  Respiratory Panel by PCR     Status: Abnormal   Collection Time: 09/10/16  3:50 PM  Result Value Ref Range Status   Adenovirus NOT DETECTED NOT DETECTED Final   Coronavirus 229E NOT DETECTED NOT DETECTED Final   Coronavirus HKU1 NOT DETECTED NOT DETECTED Final   Coronavirus NL63 NOT DETECTED NOT DETECTED Final   Coronavirus OC43 NOT DETECTED NOT DETECTED Final   Metapneumovirus NOT DETECTED NOT DETECTED Final   Rhinovirus / Enterovirus DETECTED (A) NOT DETECTED Final   Influenza A NOT DETECTED NOT DETECTED Final   Influenza B NOT DETECTED NOT DETECTED Final   Parainfluenza Virus 1 NOT DETECTED NOT DETECTED Final   Parainfluenza Virus 2 NOT DETECTED NOT DETECTED Final   Parainfluenza Virus 3 NOT DETECTED NOT DETECTED Final   Parainfluenza Virus 4 NOT DETECTED NOT DETECTED Final   Respiratory Syncytial Virus DETECTED (A) NOT DETECTED Final    Comment: CRITICAL RESULT CALLED TO, READ BACK BY AND VERIFIED WITH: S. WARD RN 17:35 09/09/16 ([email or web address])    Bordetella pertussis NOT DETECTED NOT DETECTED Final   Chlamydophila pneumoniae NOT DETECTED NOT DETECTED Final   Mycoplasma pneumoniae NOT DETECTED NOT DETECTED Final     Radiology Studies: Dg  Chest 2 View  Result Date: 09/11/2016 CLINICAL DATA:  Acute bronchitis with COPD, history CHF, coronary artery disease EXAM: CHEST  2 VIEW COMPARISON:  Lung 06/02/2016 FINDINGS: LEFT subclavian AICD with leads projecting at RIGHT atrium, RIGHT ventricle, and coronary sinus, unchanged. Enlargement of cardiac silhouette. Atherosclerotic calcification aorta. Mediastinal contours and pulmonary vascularity normal. Bronchitic changes with asymmetric biapical scarring greater on RIGHT. Slightly increased opacity in RIGHT upper lobe question developing infiltrate or atelectasis. Remaining  lungs grossly clear. No pleural effusion pneumothorax. IMPRESSION: Enlargement of cardiac silhouette post AICD. Hazy increased opacity in RIGHT upper lobe and at medial RIGHT lung base question developing infiltrate versus atelectasis. Electronically Signed   By: Lavonia Dana M.D.   On: 09/11/2016 08:04    Scheduled Meds: . amiodarone  100 mg Oral Daily  . apixaban  2.5 mg Oral BID  . azithromycin  250 mg Oral Daily  . benzonatate  200 mg Oral TID  . budesonide (PULMICORT) nebulizer solution  0.25 mg Nebulization BID  . carvedilol  6.25 mg Oral BID WC  . ezetimibe  10 mg Oral Daily  . ferrous sulfate  325 mg Oral Q M,W,F  . furosemide  20 mg Oral Daily  . hydrOXYzine  25 mg Oral QHS  . ipratropium-albuterol  3 mL Nebulization BID  . levothyroxine  25 mcg Oral Once per day on Mon Wed Fri  . levothyroxine  50 mcg Oral Once per day on Sun Tue Thu Sat  . pantoprazole  40 mg Oral Daily  . potassium chloride  10 mEq Oral Daily  . predniSONE  40 mg Oral Q breakfast  . rosuvastatin  20 mg Oral Daily  . tamsulosin  0.4 mg Oral QHS   Continuous Infusions:   LOS: 1 day   Time spent: 25 minutes.  Vance Gather, MD [facility] Pager [phone]  If 7PM-7AM, please contact night-coverage [email or web address] Password [facility] 09/12/2016, 3:49 PM

## 2016-09-12 NOTE — Evaluation (Signed)
Physical Therapy Evaluation Patient Details Name: [name] MRN: [id number] DOB: [birth year] Today's Date: 09/12/2016   History of Present Illness  Pt is an 82 y/o male admitted secondary to acute respiratory failure with hypoxemia. PMH including but not limited to CHF, CAD with EF 25%, CKD and cardiac defibrillator placement (09/2013),   Clinical Impression  Pt presented supine in bed with HOB elevated, awake and willing to participate in therapy session. Prior to admission, pt reported that he was independent with all functional mobility. Pt currently requires min guard for safety with all functional mobility and use of RW when ambulating. Pt ambulated on RA and SPO2 decreased to as low as 85% and remained in the mid 80's until pt returned to room, sitting EOB with O2 reapplied. Pt would continue to benefit from skilled physical therapy services at this time while admitted and after d/c to address his below listed limitations in order to improve his overall safety and independence with functional mobility.     Follow Up Recommendations Home health PT;Supervision for mobility/OOB    Equipment Recommendations  None recommended by PT    Recommendations for Other Services       Precautions / Restrictions Precautions Precautions: Fall Restrictions Weight Bearing Restrictions: No      Mobility  Bed Mobility Overal bed mobility: Needs Assistance Bed Mobility: Supine to Sit;Sit to Supine     Supine to sit: Min guard Sit to supine: Min guard   General bed mobility comments: pt required increased time, use of bed rails and min guard for safety  Transfers Overall transfer level: Needs assistance Equipment used: Rolling walker (2 wheeled) Transfers: Sit to/from Stand Sit to Stand: Min guard         General transfer comment: pt required increased time, min guard for safety  Ambulation/Gait Ambulation/Gait assistance: Min guard Ambulation Distance (Feet): 100  Feet Assistive device: Rolling walker (2 wheeled) Gait Pattern/deviations: Step-through pattern;Decreased stride length;Trunk flexed Gait velocity: decreased   General Gait Details: pt ambulated on RA, SPO2 decreased to 85% during ambulation and remained in mid 80's until return to room and sitting EOB with 2L of O2 via [place] reapplied. At that time, pt's SPO2 increased to 91%. pt required VC'ing for safety with use of RW  Stairs            Wheelchair Mobility    Modified Rankin (Stroke Patients Only)       Balance Overall balance assessment: Needs assistance Sitting-balance support: Feet supported;No upper extremity supported Sitting balance-Leahy Scale: Fair     Standing balance support: During functional activity;Bilateral upper extremity supported Standing balance-Leahy Scale: Poor Standing balance comment: pt reliant on bilateral UEs on RW                             Pertinent Vitals/Pain Pain Assessment: No/denies pain    Home Living Family/patient expects to be discharged to:: Private residence Living Arrangements: Spouse/significant other Available Help at Discharge: Family;Available 24 hours/day Type of Home: House Home Access: Stairs to enter Entrance Stairs-Rails: Right Entrance Stairs-Number of Steps: 4 Home Layout: Laundry or work area in basement;Other (Comment);Multi-level (does not need to go to basement) Home Equipment: Walker - 2 wheels;Cane - single point      Prior Function Level of Independence: Independent         Comments: pt reported that he was participating in cardiac rehab at [facility] prior to admission  Hand Dominance        Extremity/Trunk Assessment   Upper Extremity Assessment: Overall WFL for tasks assessed           Lower Extremity Assessment: Generalized weakness         Communication   Communication: No difficulties  Cognition Arousal/Alertness: Awake/alert Behavior During Therapy: WFL for tasks  assessed/performed Overall Cognitive Status: Within Functional Limits for tasks assessed                      General Comments      Exercises     Assessment/Plan    PT Assessment Patient needs continued PT services  PT Problem List Decreased strength;Decreased activity tolerance;Decreased balance;Decreased mobility;Decreased coordination;Decreased knowledge of use of DME;Cardiopulmonary status limiting activity          PT Treatment Interventions DME instruction;Gait training;Stair training;Functional mobility training;Therapeutic activities;Therapeutic exercise;Balance training;Neuromuscular re-education;Patient/family education    PT Goals (Current goals can be found in the Care Plan section)  Acute Rehab PT Goals Patient Stated Goal: return home tomorrow PT Goal Formulation: With patient Time For Goal Achievement: 09/26/16 Potential to Achieve Goals: Good    Frequency Min 3X/week   Barriers to discharge        Co-evaluation               End of Session Equipment Utilized During Treatment: Gait belt;Oxygen (2L of O2 via [place]) Activity Tolerance: Patient limited by fatigue Patient left: in bed;with call bell/phone within reach Nurse Communication: Mobility status         Time: 1453-1530 PT Time Calculation (min) (ACUTE ONLY): 37 min   Charges:   PT Evaluation $PT Eval Moderate Complexity: 1 Procedure PT Treatments $Gait Training: 8-22 mins   PT G CodesClearnce Sorrel Nilson Tabora 09/12/2016, 3:55 PM Sherie Don, [place], DPT [phone]

## 2016-09-13 ENCOUNTER — Encounter (HOSPITAL_COMMUNITY): Admission: RE | Admit: 2016-09-13 | Payer: Medicare Other | Source: Ambulatory Visit

## 2016-09-13 MED ORDER — PREDNISONE 20 MG PO TABS: 40.0000 mg | ORAL_TABLET | Freq: Every day | ORAL | 0 refills | Status: DC

## 2016-09-13 MED ORDER — AZITHROMYCIN 250 MG PO TABS
250.0000 mg | ORAL_TABLET | Freq: Every day | ORAL | 0 refills | Status: DC
Start: 2016-09-13 — End: 2016-12-12

## 2016-09-13 NOTE — Discharge Summary (Signed)
Physician Discharge Summary  [name] PJK:[id number] DOB: 12-31-1931 DOA: 09/10/2016  PCP: [name], MD  Admit date: 09/10/2016 Discharge date: 09/13/2016  Admitted From: Home Disposition: Home   Recommendations for Outpatient Follow-up:  1. Follow up with PCP in 1 weeks to reevaluate respiratory status - sent home on oxygen which is expected to be required for only a short time.  2. Recommend outpatient PFTs due to history of tobacco use and evidence of COPD on CXR. If negative or fails to improve, would consider amiodarone-induced pulmonary toxicity.   Home Health: PT Equipment/Devices: 2 L O2 Discharge Condition: Stable CODE STATUS: Full Diet recommendation: Heart healthy  Brief/Interim Summary: [name] Purdy Fairclothis a 82 y.o.malewith a history of tobacco use quit 2005, CAD w/ICM and chronic systolic CHF (YK99% followed by Dr. Haroldine [name]) s/p PPM/ICD, AFib on amiodarone and eliquis, CKD stage IV, hypothyroidism. Patient presents to ER from PCP office after failed outpatient therapy with Levaquin for upper respiratory infection symptoms associated with fatigue and productive cough.RAsaturations at PCP office 80%.Symptoms been ongoing since last Thursday he worsened. He reports postnasal drip precipitating paroxysmal coughing episodes. On arrival he was afebrile, requiring 2L oxygen by nasal cannula. CXR showed mild enlargement cardiac silhouette, COPD changes with biapical scarring, no acute infiltrate or edema. He was wheezing and due to concern for an exacerbation of as yet undiagnosed COPD, steroids, bronchodilators, and antibiotics were given. Viral panel became positive for rhinovirus and RSV. Supportive treatment continued with improvement in his subjective report and pulmonary exam. He continues to require oxygen by nasal cannula, but is stable for discharge.   Discharge Diagnoses:  Principal Problem:   Acute respiratory failure with hypoxia (HCC) Active Problems:    Mixed hyperlipidemia   Essential hypertension, benign   CAD, NATIVE VESSEL   Ischemic cardiomyopathy   Atrial fibrillation (HCC)   Chronic systolic CHF (congestive heart failure) (HCC)-EF 25%   ICD (implantable cardioverter-defibrillator) in place (St Jude pacer/ICD)   CKD (chronic kidney disease) stage 4, GFR 15-29 ml/min (HCC)   Acute bronchitis with COPD (HCC)  Acute respiratory failure with hypoxia: RSV and rhinoviral infections precipitated acute bronchitis/bronchiolitis.  - Continue O2 prn, weaning to room air prior to discharge.  - Still requiring O2, anticipate slow recovery from RSV bronchiolitis with no other interventions shown to be of benefit. - Treat underlying causes (see below) - Incentive spirometry, OOB, PT  Acute bronchitis with COPD: ~1/4 pack per week x50 years and wheezing suggestive of COPD - Follow up CXR with possible developing infiltrate. On Levaquin PTA > ceftriaxone and azithromycin IV > PCT neg, Strep Ag neg > switch to azithromycin po x 3 days (11/30 - 12/2) - Continue scheduled Duonebs - Taper to prednisone in AM x 3 days (11/30 - 12/2) - Monitor blood cultures and sputum culture - Outpatient PFTs: if does not respond to above treatments consider amiodarone-induced pulmonary toxicity  Essential hypertension, benign - Continue preadmission medications (Coreg-takes Flomax for BPH)  ICM and chronic systolic CHF (EF 83%): Patient's weight is stable, BNP equivocally elevated at 287. No peripheral edema but +crackles on exam.  - Doubt acute HF is primary cause of symptoms, single IV lasix dose given 11/29 with improvement in crackles/stable creatinine. Will continue to monitor on home po lasix. - Continue preadmission coreg, statin,and lasix - Daily weights and strict intake and output - Monitor BMP in AM  Atrial fibrillation CHADVASC=5. Currently maintaining sinus rhythm on amiodarone: May need to consider toxicity if COPD Dx not confirmed. -  Continue  eliquis, amiodarone  Stage IV CKD: Renal function stable at baseline (SCr 2.93) - Avoid offending medications - Eliquis 2.5mg  BID  Mixed hyperlipidemia - Continue Zetia and Crestor  Constipation: Likely related to very poor po intake. - Resolved s/p magnesium citrate x1, continue diet.  Discharge Instructions Discharge Instructions    (HEART FAILURE PATIENTS) Call MD:  Anytime you have any of the following symptoms: 1) 3 pound weight gain in 24 hours or 5 pounds in 1 week 2) shortness of breath, with or without a dry hacking cough 3) swelling in the hands, feet or stomach 4) if you have to sleep on extra pillows at night in order to breathe.    Complete by:  As directed    Call MD for:  difficulty breathing, headache or visual disturbances    Complete by:  As directed    Call MD for:  temperature >100.4    Complete by:  As directed    Diet - low sodium heart healthy    Complete by:  As directed    Discharge instructions    Complete by:  As directed    You were admitted with shortness of breath that was due to RSV infection of the lungs, called bronchiolitis. The treatment for this is supportive and includes oxygen therapy as needed. This will continue until you follow up with your PCP. You will also get home health physical therapy, after which you can return to cardiac rehab.  - Finish 5-day courses of antibiotics and steroids tomorrow by taking the final doses of prednisone and azithromycin, which have been sent to your pharmacy.   For home use only DME oxygen    Complete by:  As directed    Mode or (Route):  Nasal cannula   Liters per Minute:  2   Frequency:  Continuous (stationary and portable oxygen unit needed)   Oxygen delivery system:  Gas   Increase activity slowly    Complete by:  As directed        Medication List    STOP taking these medications   levofloxacin 250 MG tablet Commonly known as:  LEVAQUIN     TAKE these medications   acetaminophen 325 MG  tablet Commonly known as:  TYLENOL Take 2 tablets (650 mg total) by mouth every 6 (six) hours as needed for mild pain (or Fever >/= 101).   amiodarone 100 MG tablet Commonly known as:  PACERONE Take 1 tablet (100 mg total) by mouth daily.   azithromycin 250 MG tablet Commonly known as:  ZITHROMAX Take 1 tablet (250 mg total) by mouth daily.   AZO BLADDER CONTROL/GO-LESS Caps Take 1 capsule by mouth as needed (for bladder incontinence).   benzonatate 200 MG capsule Commonly known as:  TESSALON Take 200 mg by mouth 3 (three) times daily as needed for cough.   carvedilol 6.25 MG tablet Commonly known as:  COREG TAKE ONE TABLET BY MOUTH TWICE DAILY WITH MEALS   CENTRUM SILVER PO Take 1 tablet by mouth daily.   [employer] IRON 325 (65 FE) MG tablet Generic drug:  ferrous sulfate Take 325 mg by mouth every Monday, Wednesday, and Friday.   diphenhydramine-acetaminophen 25-500 MG Tabs tablet Commonly known as:  TYLENOL PM Take 1 tablet by mouth at bedtime as needed (sleep).   ELIQUIS 2.5 MG Tabs tablet Generic drug:  apixaban TAKE ONE TABLET BY MOUTH TWICE DAILY   ezetimibe 10 MG tablet Commonly known as:  ZETIA TAKE ONE TABLET BY  MOUTH EVERY DAY WITH  SUPPER   furosemide 20 MG tablet Commonly known as:  LASIX Take 20 mg by mouth daily.   hydrOXYzine 25 MG tablet Commonly known as:  ATARAX/VISTARIL Take 1 tablet by mouth at bedtime. Reported on 10/19/2015   KLOR-CON M10 10 MEQ tablet Generic drug:  potassium chloride Take 10 mEq by mouth daily.   levothyroxine 50 MCG tablet Commonly known as:  SYNTHROID, LEVOTHROID Take 25-50 mcg by mouth daily before breakfast. Take 50 mcg (1 tablet) on Tues, Thurs, Sat, Sun and 25 mcg (1/2 tablet) on Mon, Wed, Fri   MILK OF MAGNESIA PO Take 20 mLs by mouth daily as needed (for constipation).   NITROSTAT 0.4 MG SL tablet Generic drug:  nitroGLYCERIN PLACE ONE TABLET UNDER THE TONGUE EVERY 5 MINUTES AS NEEDED FOR CHEST PAIN    pantoprazole 40 MG tablet Commonly known as:  PROTONIX Take 40 mg by mouth daily.   predniSONE 20 MG tablet Commonly known as:  DELTASONE Take 2 tablets (40 mg total) by mouth daily with breakfast.   rosuvastatin 20 MG tablet Commonly known as:  CRESTOR Take 1 tablet (20 mg total) by mouth daily.   sodium chloride 0.65 % Soln nasal spray Commonly known as:  OCEAN Place 1 spray into both nostrils as needed for congestion.   tamsulosin 0.4 MG Caps capsule Commonly known as:  FLOMAX Take 1 capsule by mouth at bedtime.   valsartan 80 MG tablet Commonly known as:  DIOVAN TAKE ONE-HALF TABLET BY MOUTH ONCE DAILY ONCE IF SYSTOLIC BLOOD PRESSURE IS GREATER THAN 100            Durable Medical Equipment        Start     Ordered   09/13/16 0000  For home use only DME oxygen    Question Answer Comment  Mode or (Route) Nasal cannula   Liters per Minute 2   Frequency Continuous (stationary and portable oxygen unit needed)   Oxygen delivery system Gas      09/13/16 1058     Follow-up [facility] Follow up.   Why:  Home oxygen arranged Contact information: [place] [id number] [phone]        [facility] Follow up.   Why:  For home health PT. Will call in 1-2 days to set up first home visit. Contact information: [place] [address] [phone]          Allergies  Allergen Reactions  . Ramipril Other (See Comments)    cough  . Amoxicillin Rash  . Penicillins Rash    Consultations:  None  Procedures/Studies: Dg Chest 2 View  Result Date: 09/11/2016 CLINICAL DATA:  Acute bronchitis with COPD, history CHF, coronary artery disease EXAM: CHEST  2 VIEW COMPARISON:  Lung 06/02/2016 FINDINGS: LEFT subclavian AICD with leads projecting at RIGHT atrium, RIGHT ventricle, and coronary sinus, unchanged. Enlargement of cardiac silhouette. Atherosclerotic  calcification aorta. Mediastinal contours and pulmonary vascularity normal. Bronchitic changes with asymmetric biapical scarring greater on RIGHT. Slightly increased opacity in RIGHT upper lobe question developing infiltrate or atelectasis. Remaining lungs grossly clear. No pleural effusion pneumothorax. IMPRESSION: Enlargement of cardiac silhouette post AICD. Hazy increased opacity in RIGHT upper lobe and at medial RIGHT lung base question developing infiltrate versus atelectasis. Electronically Signed   By: Lavonia Dana M.D.   On: 09/11/2016 08:04   Dg Chest 2 View  Result Date: 09/10/2016 CLINICAL DATA:  Shortness of breast since Thursday, rales in upper lobes, crackles in BILATERAL lungs, rib pain with cough, history CHF, coronary artery disease EXAM: CHEST  2 VIEW COMPARISON:  08/16/2013, 02/18/2013 FINDINGS: LEFT subclavian AICD with leads projecting at RIGHT atrium, RIGHT ventricle and coronary sinus unchanged. Enlargement of cardiac silhouette. Atherosclerotic calcification aorta. Mediastinal contours and pulmonary vascularity normal. Hyperinflation consistent with COPD. Nodular density lateral mid RIGHT lung stable since 2014. Asymmetric biapical scarring RIGHT greater than LEFT. No acute infiltrate, pleural effusion or pneumothorax. Pulmonary edema seen on previous exam resolved. Bones demineralized. IMPRESSION: Mild enlargement cardiac silhouette post AICD placement. Aortic atherosclerosis. COPD changes with biapical scarring. No acute infiltrate. Electronically Signed   By: Lavonia Dana M.D.   On: 09/10/2016 12:10    Subjective: Pt feels improvement in respiratory status, less dyspnea on exertion but still requiring 2L O2. Ambulating frequently with PT and RN staff. Eating better. Feeling stronger, corroborated appearing stronger with wife.   Discharge Exam: Vitals:   09/13/16 0505 09/13/16 1412  BP: (!) 107/57 (!) 109/52  Pulse: 67 64  Resp: 18   Temp: 97.7 F (36.5 C) 97.2 F (36.2 C)    General: Pt is alert, awake, not in acute distress Cardiovascular: RRR, S1/S2 +, no rubs, no gallops Respiratory: Nonlabored on 2L O2 by nasal cannula. Mild endexpiratory rhonchi scattered without focal findings. Wheezing is now absent. No crackles.  Abdominal: Soft, NT, ND, bowel sounds + Extremities: no edema, no cyanosis  The results of significant diagnostics from this hospitalization (including imaging, microbiology, ancillary and laboratory) are listed below for reference.    Microbiology: Recent Results (from the past 240 hour(s))  Culture, blood (routine x 2) Call MD if unable to obtain prior to antibiotics being given     Status: None (Preliminary result)   Collection Time: 09/10/16  3:05 PM  Result Value Ref Range Status   Specimen Description BLOOD LEFT ANTECUBITAL  Final   Special Requests BOTTLES DRAWN AEROBIC AND ANAEROBIC 5CC  Final   Culture NO GROWTH 3 DAYS  Final   Report Status PENDING  Incomplete  Culture, blood (routine x 2) Call MD if unable to obtain prior to antibiotics being given     Status: None (Preliminary result)   Collection Time: 09/10/16  3:23 PM  Result Value Ref Range Status   Specimen Description BLOOD RIGHT FOREARM  Final   Special Requests BOTTLES DRAWN AEROBIC ONLY 5CC  Final   Culture NO GROWTH 3 DAYS  Final   Report Status PENDING  Incomplete  Respiratory Panel by PCR     Status: Abnormal   Collection Time: 09/10/16  3:50 PM  Result Value Ref Range Status   Adenovirus NOT DETECTED NOT DETECTED Final   Coronavirus 229E NOT DETECTED NOT DETECTED Final   Coronavirus HKU1 NOT DETECTED NOT DETECTED Final   Coronavirus NL63 NOT DETECTED NOT DETECTED Final   Coronavirus OC43 NOT DETECTED NOT DETECTED Final   Metapneumovirus NOT DETECTED NOT DETECTED Final   Rhinovirus / Enterovirus DETECTED (A) NOT DETECTED Final   Influenza A NOT DETECTED NOT DETECTED Final   Influenza B NOT DETECTED NOT DETECTED Final   Parainfluenza Virus 1 NOT DETECTED NOT  DETECTED Final   Parainfluenza Virus 2 NOT DETECTED NOT DETECTED Final   Parainfluenza Virus 3 NOT DETECTED NOT DETECTED Final   Parainfluenza Virus 4 NOT DETECTED NOT DETECTED Final   Respiratory Syncytial Virus DETECTED (A) NOT DETECTED Final    Comment:  CRITICAL RESULT CALLED TO, READ BACK BY AND VERIFIED WITH: S. WARD RN 17:35 09/09/16 ([email or web address])    Bordetella pertussis NOT DETECTED NOT DETECTED Final   Chlamydophila pneumoniae NOT DETECTED NOT DETECTED Final   Mycoplasma pneumoniae NOT DETECTED NOT DETECTED Final     Labs: BNP (last 3 results)  Recent Labs  09/10/16 1120  BNP 960.4*   Basic Metabolic Panel:  Recent Labs Lab 09/10/16 1120 09/10/16 1624 09/11/16 0611 09/12/16 0512  NA 138  --  139 139  K 4.4  --  3.8 4.0  CL 104  --  105 104  CO2 22  --  23 26  GLUCOSE 102*  --  162* 158*  BUN 33*  --  38* 57*  CREATININE 2.93*  --  2.89* 2.85*  CALCIUM 9.0  --  9.1 8.8*  MG  --  2.5*  --   --   PHOS  --  3.3  --   --    Liver Function Tests:  Recent Labs Lab 09/11/16 0611  AST 42*  ALT 19  ALKPHOS 49  BILITOT 0.7  PROT 6.5  ALBUMIN 3.3*   No results for input(s): LIPASE, AMYLASE in the last 168 hours. No results for input(s): AMMONIA in the last 168 hours. CBC:  Recent Labs Lab 09/10/16 1120 09/11/16 0611 09/12/16 0512  WBC 7.6 6.5 16.2*  NEUTROABS 4.5  --   --   HGB 13.0 12.0* 11.8*  HCT 39.7 36.6* 36.2*  MCV 92.3 91.5 91.4  PLT 182 167 178   Cardiac Enzymes: No results for input(s): CKTOTAL, CKMB, CKMBINDEX, TROPONINI in the last 168 hours. BNP: Invalid input(s): POCBNP CBG: No results for input(s): GLUCAP in the last 168 hours. D-Dimer No results for input(s): DDIMER in the last 72 hours. Hgb A1c No results for input(s): HGBA1C in the last 72 hours. Lipid Profile No results for input(s): CHOL, HDL, LDLCALC, TRIG, CHOLHDL, LDLDIRECT in the last 72 hours. Thyroid function studies No results for input(s): TSH, T4TOTAL, T3FREE,  THYROIDAB in the last 72 hours.  Invalid input(s): FREET3 Anemia work up No results for input(s): VITAMINB12, FOLATE, FERRITIN, TIBC, IRON, RETICCTPCT in the last 72 hours. Urinalysis    Component Value Date/Time   COLORURINE YELLOW 08/15/2013 1132   APPEARANCEUR CLOUDY (A) 08/15/2013 1132   LABSPEC 1.026 08/15/2013 1132   PHURINE 5.5 08/15/2013 1132   GLUCOSEU NEGATIVE 08/15/2013 1132   HGBUR LARGE (A) 08/15/2013 1132   BILIRUBINUR NEGATIVE 08/15/2013 1132   KETONESUR NEGATIVE 08/15/2013 1132   PROTEINUR 100 (A) 08/15/2013 1132   UROBILINOGEN 0.2 08/15/2013 1132   NITRITE NEGATIVE 08/15/2013 1132   LEUKOCYTESUR SMALL (A) 08/15/2013 1132   Sepsis Labs Invalid input(s): PROCALCITONIN,  WBC,  LACTICIDVEN Microbiology Recent Results (from the past 240 hour(s))  Culture, blood (routine x 2) Call MD if unable to obtain prior to antibiotics being given     Status: None (Preliminary result)   Collection Time: 09/10/16  3:05 PM  Result Value Ref Range Status   Specimen Description BLOOD LEFT ANTECUBITAL  Final   Special Requests BOTTLES DRAWN AEROBIC AND ANAEROBIC 5CC  Final   Culture NO GROWTH 3 DAYS  Final   Report Status PENDING  Incomplete  Culture, blood (routine x 2) Call MD if unable to obtain prior to antibiotics being given     Status: None (Preliminary result)   Collection Time: 09/10/16  3:23 PM  Result Value Ref Range Status   Specimen Description BLOOD RIGHT FOREARM  Final   Special Requests BOTTLES DRAWN AEROBIC ONLY 5CC  Final   Culture NO GROWTH 3 DAYS  Final   Report Status PENDING  Incomplete  Respiratory Panel by PCR     Status: Abnormal   Collection Time: 09/10/16  3:50 PM  Result Value Ref Range Status   Adenovirus NOT DETECTED NOT DETECTED Final   Coronavirus 229E NOT DETECTED NOT DETECTED Final   Coronavirus HKU1 NOT DETECTED NOT DETECTED Final   Coronavirus NL63 NOT DETECTED NOT DETECTED Final   Coronavirus OC43 NOT DETECTED NOT DETECTED Final    Metapneumovirus NOT DETECTED NOT DETECTED Final   Rhinovirus / Enterovirus DETECTED (A) NOT DETECTED Final   Influenza A NOT DETECTED NOT DETECTED Final   Influenza B NOT DETECTED NOT DETECTED Final   Parainfluenza Virus 1 NOT DETECTED NOT DETECTED Final   Parainfluenza Virus 2 NOT DETECTED NOT DETECTED Final   Parainfluenza Virus 3 NOT DETECTED NOT DETECTED Final   Parainfluenza Virus 4 NOT DETECTED NOT DETECTED Final   Respiratory Syncytial Virus DETECTED (A) NOT DETECTED Final    Comment: CRITICAL RESULT CALLED TO, READ BACK BY AND VERIFIED WITH: S. WARD RN 17:35 09/09/16 ([email or web address])    Bordetella pertussis NOT DETECTED NOT DETECTED Final   Chlamydophila pneumoniae NOT DETECTED NOT DETECTED Final   Mycoplasma pneumoniae NOT DETECTED NOT DETECTED Final    Time coordinating discharge: Over 30 minutes  Vance Gather, MD  [facility] 09/13/2016, 3:21 PM Pager [phone]  If 7PM-7AM, please contact night-coverage [email or web address] Password [email or web address]

## 2016-09-13 NOTE — Progress Notes (Signed)
SATURATION QUALIFICATIONS: (This note is used to comply with regulatory documentation for home oxygen)  Patient Saturations on Room Air at Rest = 90%  Patient Saturations on Room Air while Ambulating = 84%  Patient Saturations on 2 Liters of oxygen while Ambulating = 94%  Please briefly explain why patient needs home oxygen:   Patient's oxygen level decreased when ambulating on room air.

## 2016-09-13 NOTE — Progress Notes (Signed)
CM received order for home oxygen. Choice provided to pt, [facility] selected. CM spoke with [facility] and referral made for home oxygen. Portable oxygen to be delivered to beside for transportation to home. CM made pt aware. Whitman Hero RN,BSN,CM

## 2016-09-13 NOTE — Progress Notes (Signed)
Patient and patient's spouse verbalzied understanding of discharge instructions. IV removed. Vital signs taken and stable. Patient able to teach back information. Patient to be taken downstairs by wheelchair transport from volunteer services. Patient will go home on his own home o2 tank on 2 L. Patient verbalized understanding of how to use tank

## 2016-09-14 DIAGNOSIS — I130 Hypertensive heart and chronic kidney disease with heart failure and stage 1 through stage 4 chronic kidney disease, or unspecified chronic kidney disease: Secondary | ICD-10-CM | POA: Diagnosis not present

## 2016-09-14 DIAGNOSIS — N184 Chronic kidney disease, stage 4 (severe): Secondary | ICD-10-CM | POA: Diagnosis not present

## 2016-09-14 DIAGNOSIS — I4891 Unspecified atrial fibrillation: Secondary | ICD-10-CM | POA: Diagnosis not present

## 2016-09-14 DIAGNOSIS — I255 Ischemic cardiomyopathy: Secondary | ICD-10-CM | POA: Diagnosis not present

## 2016-09-14 DIAGNOSIS — Z87891 Personal history of nicotine dependence: Secondary | ICD-10-CM | POA: Diagnosis not present

## 2016-09-14 DIAGNOSIS — I5022 Chronic systolic (congestive) heart failure: Secondary | ICD-10-CM | POA: Diagnosis not present

## 2016-09-14 DIAGNOSIS — J440 Chronic obstructive pulmonary disease with acute lower respiratory infection: Secondary | ICD-10-CM | POA: Diagnosis not present

## 2016-09-14 DIAGNOSIS — E039 Hypothyroidism, unspecified: Secondary | ICD-10-CM | POA: Diagnosis not present

## 2016-09-14 DIAGNOSIS — Z95810 Presence of automatic (implantable) cardiac defibrillator: Secondary | ICD-10-CM | POA: Diagnosis not present

## 2016-09-14 DIAGNOSIS — I2510 Atherosclerotic heart disease of native coronary artery without angina pectoris: Secondary | ICD-10-CM | POA: Diagnosis not present

## 2016-09-14 DIAGNOSIS — Z9981 Dependence on supplemental oxygen: Secondary | ICD-10-CM | POA: Diagnosis not present

## 2016-09-14 DIAGNOSIS — J209 Acute bronchitis, unspecified: Secondary | ICD-10-CM | POA: Diagnosis not present

## 2016-09-14 DIAGNOSIS — Z8551 Personal history of malignant neoplasm of bladder: Secondary | ICD-10-CM | POA: Diagnosis not present

## 2016-09-14 DIAGNOSIS — E782 Mixed hyperlipidemia: Secondary | ICD-10-CM | POA: Diagnosis not present

## 2016-09-15 LAB — CULTURE, BLOOD (ROUTINE X 2)
CULTURE: NO GROWTH
Culture: NO GROWTH

## 2016-09-16 ENCOUNTER — Encounter (HOSPITAL_COMMUNITY): Payer: Medicare Other

## 2016-09-17 ENCOUNTER — Telehealth: Payer: Self-pay | Admitting: Cardiology

## 2016-09-17 DIAGNOSIS — N184 Chronic kidney disease, stage 4 (severe): Secondary | ICD-10-CM | POA: Diagnosis not present

## 2016-09-17 DIAGNOSIS — I130 Hypertensive heart and chronic kidney disease with heart failure and stage 1 through stage 4 chronic kidney disease, or unspecified chronic kidney disease: Secondary | ICD-10-CM | POA: Diagnosis not present

## 2016-09-17 DIAGNOSIS — I2510 Atherosclerotic heart disease of native coronary artery without angina pectoris: Secondary | ICD-10-CM | POA: Diagnosis not present

## 2016-09-17 DIAGNOSIS — J440 Chronic obstructive pulmonary disease with acute lower respiratory infection: Secondary | ICD-10-CM | POA: Diagnosis not present

## 2016-09-17 DIAGNOSIS — J209 Acute bronchitis, unspecified: Secondary | ICD-10-CM | POA: Diagnosis not present

## 2016-09-17 DIAGNOSIS — I5022 Chronic systolic (congestive) heart failure: Secondary | ICD-10-CM | POA: Diagnosis not present

## 2016-09-17 NOTE — Progress Notes (Addendum)
Reviewed home exercise with pt on 08/23/16.  Pt plans to walk and use stationary bike for exercise, 2x/week in addition to coming to [facility].  Reviewed THR, pulse, RPE, sign and symptoms, and when to call 911 or MD.  Also discussed weather considerations and indoor options.  Pt voiced understanding.      Charlayne Vultaggio [employer]

## 2016-09-17 NOTE — Addendum Note (Signed)
Encounter addended by: Stacie Templin D Evalyn Shultis on: 09/17/2016 12:10 PM<BR>    Actions taken: Visit Navigator Flowsheet section accepted, Flowsheet accepted, Sign clinical note

## 2016-09-17 NOTE — Telephone Encounter (Signed)
Spoke w/ pt wife and requested that he send a remote transmission w/ his home monitor b/c his monitor has not updated in the last 7 days. Pt wife verbalized understanding and stated that they will take it care of it sometime this week.

## 2016-09-18 ENCOUNTER — Telehealth (HOSPITAL_COMMUNITY): Payer: Self-pay | Admitting: *Deleted

## 2016-09-18 ENCOUNTER — Encounter (HOSPITAL_COMMUNITY): Admission: RE | Admit: 2016-09-18 | Payer: Medicare Other | Source: Ambulatory Visit

## 2016-09-18 NOTE — Telephone Encounter (Signed)
-----   Message from Jolaine Artist, MD sent at 09/16/2016  9:44 PM EST ----- Regarding: RE: When may pt return to cardiac rehab Yes. OK to resume an titrate O2 as needed. Thank you for taking care of him!  ----- Message ----- From: Rowe Pavy, RN Sent: 09/16/2016  10:25 AM To: Jolaine Artist, MD Subject: When may pt return to cardiac rehab            Dr.Bensimhon,  The above pt was in the hospital from 11/28 - 12/1 for acute respiratory failure with hypoxia (RSV and rhinovirus infection precipitated by acute bronchitis/bronchiolites).  Pt is on home oxygen support at [facility] continuous.  Pt is to follow up with primary MD in one week and possible PFT follow up due to copd seen on CXR.   When may pt resume exercise?  If ok to resume, may we titrate oxygen therapy to maintain o2 sat 92 or greater?  Thanks so much for the advisement [employer] RN

## 2016-09-18 NOTE — Telephone Encounter (Signed)
Received ok for pt to return to rehab.  Called and spoke to pt wife regarding how pt is recovering from his hospitalization.  Pt is getting some stronger. PT started with pt on yesterday.  Advised pt that he would need to complete home physical therapy prior to returning back to cardiac rehab due to medicare reimubursement.  Asked pt to keep rehab staff informed on how he is progressing and when he no longer needs to PT.   Pt wife verbalized understanding. Cherre Huger, BSN

## 2016-09-19 ENCOUNTER — Encounter (HOSPITAL_COMMUNITY): Payer: Self-pay | Admitting: *Deleted

## 2016-09-19 DIAGNOSIS — N184 Chronic kidney disease, stage 4 (severe): Secondary | ICD-10-CM | POA: Diagnosis not present

## 2016-09-19 DIAGNOSIS — I5022 Chronic systolic (congestive) heart failure: Secondary | ICD-10-CM | POA: Diagnosis not present

## 2016-09-19 DIAGNOSIS — I2510 Atherosclerotic heart disease of native coronary artery without angina pectoris: Secondary | ICD-10-CM | POA: Diagnosis not present

## 2016-09-19 DIAGNOSIS — J209 Acute bronchitis, unspecified: Secondary | ICD-10-CM | POA: Diagnosis not present

## 2016-09-19 DIAGNOSIS — I130 Hypertensive heart and chronic kidney disease with heart failure and stage 1 through stage 4 chronic kidney disease, or unspecified chronic kidney disease: Secondary | ICD-10-CM | POA: Diagnosis not present

## 2016-09-19 DIAGNOSIS — J440 Chronic obstructive pulmonary disease with acute lower respiratory infection: Secondary | ICD-10-CM | POA: Diagnosis not present

## 2016-09-19 NOTE — Progress Notes (Signed)
Cardiac Individual Treatment Plan  Patient Details  Name: [name] MRN: [id number] Date of Birth: July 27, 1932 Referring Provider:   Flowsheet Row CARDIAC REHAB PHASE II ORIENTATION from 08/01/2016 in [facility]  Referring Provider  Glori Bickers MD      Initial Encounter Date:  [place] PHASE II ORIENTATION from 08/01/2016 in [facility]  Date  08/01/16  Referring Provider  Glori Bickers MD      Visit Diagnosis: No diagnosis found.  Patient's Home Medications on Admission:  Current Outpatient Prescriptions:  .  acetaminophen (TYLENOL) 325 MG tablet, Take 2 tablets (650 mg total) by mouth every 6 (six) hours as needed for mild pain (or Fever >/= 101)., Disp: , Rfl:  .  amiodarone (PACERONE) 100 MG tablet, Take 1 tablet (100 mg total) by mouth daily., Disp: , Rfl:  .  azithromycin (ZITHROMAX) 250 MG tablet, Take 1 tablet (250 mg total) by mouth daily., Disp: 1 tablet, Rfl: 0 .  benzonatate (TESSALON) 200 MG capsule, Take 200 mg by mouth 3 (three) times daily as needed for cough., Disp: , Rfl:  .  carvedilol (COREG) 6.25 MG tablet, TAKE ONE TABLET BY MOUTH TWICE DAILY WITH MEALS, Disp: 60 tablet, Rfl: 3 .  diphenhydramine-acetaminophen (TYLENOL PM) 25-500 MG TABS tablet, Take 1 tablet by mouth at bedtime as needed (sleep)., Disp: , Rfl:  .  ELIQUIS 2.5 MG TABS tablet, TAKE ONE TABLET BY MOUTH TWICE DAILY, Disp: 60 tablet, Rfl: 3 .  ezetimibe (ZETIA) 10 MG tablet, TAKE ONE TABLET BY MOUTH EVERY DAY WITH  SUPPER, Disp: 90 tablet, Rfl: 3 .  ferrous sulfate ([employer] IRON) 325 (65 FE) MG tablet, Take 325 mg by mouth every Monday, Wednesday, and Friday. , Disp: , Rfl:  .  furosemide (LASIX) 20 MG tablet, Take 20 mg by mouth daily. , Disp: , Rfl:  .  hydrOXYzine (ATARAX/VISTARIL) 25 MG tablet, Take 1 tablet by mouth at bedtime. Reported on 10/19/2015, Disp: , Rfl:  .  KLOR-CON M10 10 MEQ tablet, Take  10 mEq by mouth daily. , Disp: , Rfl:  .  levothyroxine (SYNTHROID, LEVOTHROID) 50 MCG tablet, Take 25-50 mcg by mouth daily before breakfast. Take 50 mcg (1 tablet) on Tues, Thurs, Sat, Sun and 25 mcg (1/2 tablet) on Mon, Wed, Fri, Disp: , Rfl:  .  Magnesium Hydroxide (MILK OF MAGNESIA PO), Take 20 mLs by mouth daily as needed (for constipation)., Disp: , Rfl:  .  Multiple Vitamins-Minerals (CENTRUM SILVER PO), Take 1 tablet by mouth daily. , Disp: , Rfl:  .  NITROSTAT 0.4 MG SL tablet, PLACE ONE TABLET UNDER THE TONGUE EVERY 5 MINUTES AS NEEDED FOR CHEST PAIN, Disp: 25 tablet, Rfl: 0 .  pantoprazole (PROTONIX) 40 MG tablet, Take 40 mg by mouth daily. , Disp: , Rfl:  .  predniSONE (DELTASONE) 20 MG tablet, Take 2 tablets (40 mg total) by mouth daily with breakfast., Disp: 2 tablet, Rfl: 0 .  Pumpkin Seed-Soy Germ (AZO BLADDER CONTROL/GO-LESS) CAPS, Take 1 capsule by mouth as needed (for bladder incontinence). , Disp: , Rfl:  .  rosuvastatin (CRESTOR) 20 MG tablet, Take 1 tablet (20 mg total) by mouth daily., Disp: 90 tablet, Rfl: 3 .  sodium chloride (OCEAN) 0.65 % SOLN nasal spray, Place 1 spray into both nostrils as needed for congestion., Disp: , Rfl:  .  tamsulosin (FLOMAX) 0.4 MG CAPS capsule, Take 1 capsule by mouth at bedtime., Disp: ,  Rfl:  .  valsartan (DIOVAN) 80 MG tablet, TAKE ONE-HALF TABLET BY MOUTH ONCE DAILY ONCE IF SYSTOLIC BLOOD PRESSURE IS GREATER THAN 100, Disp: 30 tablet, Rfl: 3  Past Medical History: Past Medical History:  Diagnosis Date  . Acute bronchitis with COPD ([facility]) 09/10/2016  . Anemia   . Atrial fibrillation or flutter    maintaining sinus on amiodarone    . Bladder cancer ([facility]) dx'd 05/2012  . CAD (coronary artery disease)     a. s/p anterior MI 12/05 c/b shock -> stent LAD   b. s/p stenting OM-1, 2/06  . CHF (congestive heart failure) ([facility])    due to ischemic CM  a. EF 20-30%. (Nov 2008)   b. s/p [employer] BiV-ICD    c. CPX 07/2008  pvo2 16.3 (63% predicted)  slope 34 RER 1.08 O2 pulse 93%  . Cough    occasional /productive, no fever/ not new  . CRI (chronic renal insufficiency)    (baseline 2.0-2.2)/ recent hospitalization 8/13  . GERD (gastroesophageal reflux disease)    hx Barretts esophagitis  . History of blood transfusion    following coumadin  usage  . HTN (hypertension)    EKG 05/14/12,Chest x ray 8/13, last ICD interrogation 8/13 EPIC  . Hyperlipidemia   . Hypothyroidism   . 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 Skin cancer    basal cells   facial x 4, 1 right forearm  . Sleep apnea    STOP BANG SCORE 4    Tobacco Use: History  Smoking Status  . Former Smoker  . Types: Cigarettes, Cigars  . Quit date: 03/02/2004  Smokeless Tobacco  . Former Systems developer  . Quit date: 05/20/1999    Comment: quit in 2005    Labs: Recent Review Flowsheet Data    Labs for ITP Cardiac and Pulmonary Rehab Latest Ref Rng & Units 11/13/2006 10/06/2007 11/02/2007 04/25/2008 08/15/2013   Cholestrol 0 - 200 mg/dL 111 - 121 90 -   LDLCALC 0 - 99 mg/dL 66 - 73 47 -   HDL >39.0 mg/dL 30.3(L) - 30.7(L) 29.3(L) -   Trlycerides 0 - 149 mg/dL 75 - 85 67 -   HCO3 - - 26.1(H) - - -   TCO2 0 - 100 mmol/L - 27 - - 22      Capillary Blood Glucose: No results found for: GLUCAP   Exercise Target Goals:    Exercise Program Goal: Individual exercise prescription set with THRR, safety & activity barriers. Participant demonstrates ability to understand and report RPE using BORG scale, to self-measure pulse accurately, and to acknowledge the importance of the exercise prescription.  Exercise Prescription Goal: Starting with aerobic activity 30 plus minutes a day, 3 days per week for initial exercise prescription. Provide home exercise prescription and guidelines that participant acknowledges understanding prior to discharge.  Activity Barriers  & Risk Stratification:     Activity Barriers & Cardiac Risk Stratification - 08/01/16 1406      Activity Barriers & Cardiac Risk Stratification   Cardiac Risk Stratification High      6 Minute Walk:     6 Minute Walk    Row Name 08/01/16 1355         6 Minute Walk   Phase Initial     Distance 1363 feet     Walk Time 6 minutes     #  of Rest Breaks 0     MPH 2.58     METS 2.3     RPE 9     VO2 Peak 8.22     Symptoms No     Resting HR 65 bpm     Resting BP 119/75     Max Ex. HR 95 bpm     Max Ex. BP 118/60     2 Minute Post BP 116/68        Initial Exercise Prescription:     Initial Exercise Prescription - 08/01/16 1400      Date of Initial Exercise RX and Referring Provider   Date 08/01/16   Referring Provider Bensimhon, Daniel MD     Recumbant Bike   Level 2   Minutes 10   METs 2     NuStep   Level 3   Minutes 10   METs 2     Cybex   Level 2   Minutes 10   METs 2     Track   Laps 9   Minutes 10   METs 2.39     Prescription Details   Frequency (times per week) 3   Duration Progress to 30 minutes of continuous aerobic without signs/symptoms of physical distress     Intensity   THRR 40-80% of Max Heartrate 53-110   Ratings of Perceived Exertion 11-13     Resistance Training   Training Prescription Yes   Weight 2lbs   Reps 10-12      Perform Capillary Blood Glucose checks as needed.  Exercise Prescription Changes:     Exercise Prescription Changes    Row Name 08/07/16 1700 08/20/16 1000 09/02/16 1204         Exercise Review   Progression Yes Yes  -       Response to Exercise   Blood Pressure (Admit) 110/64 106/62 128/68     Blood Pressure (Exercise) 122/66 118/60 120/60     Blood Pressure (Exit) 102/60 100/68 100/70     Heart Rate (Admit) 88 bpm 63 bpm 64 bpm     Heart Rate (Exercise) 96 bpm 97 bpm 95 bpm     Heart Rate (Exit) 76 bpm 60 bpm 40 bpm     Rating of Perceived Exertion (Exercise) 11 11 11      Symptoms none none  none     Comments  -  - reviewed HEP on 08/23/16     Duration Progress to 30 minutes of continuous aerobic without signs/symptoms of physical distress Progress to 30 minutes of continuous aerobic without signs/symptoms of physical distress Progress to 30 minutes of continuous aerobic without signs/symptoms of physical distress     Intensity THRR unchanged THRR unchanged THRR unchanged       Progression   Progression Continue to progress workloads to maintain intensity without signs/symptoms of physical distress. Continue to progress workloads to maintain intensity without signs/symptoms of physical distress. Continue progressive overload as per policy without signs/symptoms or physical distress.     Average METs 2.2 2.4  -       Resistance Training   Training Prescription Yes Yes  -     Weight 2lbs 5lbs  -     Reps 10-12 10-12  -       Interval Training   Interval Training No No  -       Recumbant Bike   Level 2 2  -     Minutes 10 10  -  METs - 2.8  -       NuStep   Level 3 3  -     Minutes 10 10  -     METs 2.2 2.1  -       Cybex   Level -  -  -     Minutes -  -  -     METs -  -  -       Track   Laps 7 10  -     Minutes 10 10  -     METs 2.23 2.74  -        Exercise Comments:     Exercise Comments    Row Name 08/07/16 1704 08/21/16 1303 09/17/16 1203       Exercise Comments Participant off to a good start with exercise. Reviewed METs and goals. Pt is tolerating exercise well; will continue to monitor exercise progression. Pt last session at [facility] was on 09/02/16. Pt was admitted to ED for acute bronchitis with COPD and acute respiratory failure with hypoxia        Discharge Exercise Prescription (Final Exercise Prescription Changes):     Exercise Prescription Changes - 09/02/16 1204      Response to Exercise   Blood Pressure (Admit) 128/68   Blood Pressure (Exercise) 120/60   Blood Pressure (Exit) 100/70   Heart Rate (Admit) 64 bpm   Heart Rate  (Exercise) 95 bpm   Heart Rate (Exit) 40 bpm   Rating of Perceived Exertion (Exercise) 11   Symptoms none   Comments reviewed HEP on 08/23/16   Duration Progress to 30 minutes of continuous aerobic without signs/symptoms of physical distress   Intensity THRR unchanged     Progression   Progression Continue progressive overload as per policy without signs/symptoms or physical distress.      Nutrition:  Target Goals: Understanding of nutrition guidelines, daily intake of sodium 1500mg , cholesterol 200mg , calories 30% from fat and 7% or less from saturated fats, daily to have 5 or more servings of fruits and vegetables.  Biometrics:     Pre Biometrics - 08/01/16 1358      Pre Biometrics   Height 5\' 10"  (1.778 m)   Weight 182 lb 5.1 oz (82.7 kg)   Waist Circumference 42 inches   Hip Circumference 42 inches   Waist to Hip Ratio 1 %   BMI (Calculated) 26.2   Triceps Skinfold 23 mm   % Body Fat 30 %   Grip Strength 34 kg   Flexibility 7.5 in   Single Leg Stand 0 seconds       Nutrition Therapy Plan and Nutrition Goals:     Nutrition Therapy & Goals - 08/05/16 1036      Nutrition Therapy   Diet Therapeutic Lifestyle Changes     Personal Nutrition Goals   Personal Goal #1 1-2 lb wt loss/week to a wt loss goal of 6-15 lb at graduation from [facility], educate and counsel regarding individualized specific dietary modifications aiming towards targeted core components such as weight, hypertension, lipid management, diabetes, heart failure and other comorbidities.   Expected Outcomes Short Term Goal: Understand basic principles of dietary content, such as calories, fat, sodium, cholesterol and nutrients.;Long Term Goal: Adherence to prescribed nutrition plan.      Nutrition Discharge: Nutrition Scores:     Nutrition Assessments - 08/05/16 1036      MEDFICTS Scores  Pre Score 106      Nutrition Goals  Re-Evaluation:   Psychosocial: Target Goals: Acknowledge presence or absence of depression, maximize coping skills, provide positive support system. Participant is able to verbalize types and ability to use techniques and skills needed for reducing stress and depression.  Initial Review & Psychosocial Screening:     Initial Psych Review & Screening - 08/01/16 1618      Family Dynamics   Good Support System? Yes  Pt wife accompanied pt to orientation   Comments No psychosocial needs identified, will continue to monitor and re evaluate as needed.     Barriers   Psychosocial barriers to participate in program There are no identifiable barriers or psychosocial needs.      Quality of Life Scores:     Quality of Life - 08/01/16 1400      Quality of Life Scores   Health/Function Pre 22.89 %   Socioeconomic Pre 22 %   Psych/Spiritual Pre 27.43 %   Family Pre 28.8 %   GLOBAL Pre 25.08 %      PHQ-9: Recent Review Flowsheet Data    Depression screen [facility] 2/9 08/01/2016   Decreased Interest 0   Down, Depressed, Hopeless 0   PHQ - 2 Score 0      Psychosocial Evaluation and Intervention:     Psychosocial Evaluation - 09/19/16 1432      Psychosocial Evaluation & Interventions   Interventions Encouraged to exercise with the program and follow exercise prescription   Comments Pt has supportive wife who accompanies him to rehab.   Continued Psychosocial Services Needed No      Psychosocial Re-Evaluation:     Psychosocial Re-Evaluation    [place] Name 08/22/16 1436 09/19/16 1432           Psychosocial Re-Evaluation   Interventions Encouraged to attend Cardiac Rehabilitation for the exercise Encouraged to attend Cardiac Rehabilitation for the exercise      Continued Psychosocial Services Needed  - No         Vocational Rehabilitation: Provide vocational rehab assistance to qualifying candidates.   Vocational Rehab Evaluation & Intervention:     Vocational Rehab -  08/01/16 1620      Initial Vocational Rehab Evaluation & Intervention   Assessment shows need for Vocational Rehabilitation No  Pt does not plan to return to work.  Pt is retired.      Education: Education Goals: Education classes will be provided on a weekly basis, covering required topics. Participant will state understanding/return demonstration of topics presented.  Learning Barriers/Preferences:     Learning Barriers/Preferences - 08/01/16 0813      Learning Barriers/Preferences   Learning Barriers Sight   Learning Preferences Video      Education Topics: Count Your Pulse:  -Group instruction provided by verbal instruction, demonstration, patient participation and written materials to support subject.  Instructors address importance of being able to find your pulse and how to count your pulse when at home without a heart monitor.  Patients get hands on experience counting their pulse with staff help and individually.   Heart Attack, Angina, and Risk Factor Modification:  -Group instruction provided by verbal instruction, video, and written materials to support subject.  Instructors address signs and symptoms of angina and heart attacks.    Also discuss risk factors for heart disease and how to make changes to improve heart health risk factors.   Functional Fitness:  -Group instruction provided by verbal instruction, demonstration, patient participation, and  written materials to support subject.  Instructors address safety measures for doing things around the house.  Discuss how to get up and down off the floor, how to pick things up properly, how to safely get out of a chair without assistance, and balance training.   Meditation and Mindfulness:  -Group instruction provided by verbal instruction, patient participation, and written materials to support subject.  Instructor addresses importance of mindfulness and meditation practice to help reduce stress and improve awareness.   Instructor also leads participants through a meditation exercise.  Flowsheet Row CARDIAC REHAB PHASE II EXERCISE from 08/30/2016 in [facility]  Date  08/07/16  Instruction Review Code  2- meets goals/outcomes      Stretching for Flexibility and Mobility:  -Group instruction provided by verbal instruction, patient participation, and written materials to support subject.  Instructors lead participants through series of stretches that are designed to increase flexibility thus improving mobility.  These stretches are additional exercise for major muscle groups that are typically performed during regular warm up and cool down.   Hands Only CPR Anytime:  -Group instruction provided by verbal instruction, video, patient participation and written materials to support subject.  Instructors co-teach with AHA video for hands only CPR.  Participants get hands on experience with mannequins.   Nutrition I class: Heart Healthy Eating:  -Group instruction provided by PowerPoint slides, verbal discussion, and written materials to support subject matter. The instructor gives an explanation and review of the Therapeutic Lifestyle Changes diet recommendations, which includes a discussion on lipid goals, dietary fat, sodium, fiber, plant stanol/sterol esters, sugar, and the components of a well-balanced, healthy diet.   Nutrition II class: Lifestyle Skills:  -Group instruction provided by PowerPoint slides, verbal discussion, and written materials to support subject matter. The instructor gives an explanation and review of label reading, grocery shopping for heart health, heart healthy recipe modifications, and ways to make healthier choices when eating out.   Diabetes Question & Answer:  -Group instruction provided by PowerPoint slides, verbal discussion, and written materials to support subject matter. The instructor gives an explanation and review of diabetes co-morbidities,  pre- and post-prandial blood glucose goals, pre-exercise blood glucose goals, signs, symptoms, and treatment of hypoglycemia and hyperglycemia, and foot care basics.   Diabetes Blitz:  -Group instruction provided by PowerPoint slides, verbal discussion, and written materials to support subject matter. The instructor gives an explanation and review of the physiology behind type 1 and type 2 diabetes, diabetes medications and rational behind using different medications, pre- and post-prandial blood glucose recommendations and Hemoglobin A1c goals, diabetes diet, and exercise including blood glucose guidelines for exercising safely.    Portion Distortion:  -Group instruction provided by PowerPoint slides, verbal discussion, written materials, and food models to support subject matter. The instructor gives an explanation of serving size versus portion size, changes in portions sizes over the last 20 years, and what consists of a serving from each food group.   Stress Management:  -Group instruction provided by verbal instruction, video, and written materials to support subject matter.  Instructors review role of stress in heart disease and how to cope with stress positively.   Flowsheet Row CARDIAC REHAB PHASE II EXERCISE from 08/30/2016 in [place]  Date  08/28/16  Instruction Review Code  2- meets goals/outcomes      Exercising on Your Own:  -Group instruction provided by verbal instruction, power point, and written materials to support subject.  Instructors discuss benefits of exercise, components of exercise, frequency and intensity of exercise, and end points for exercise.  Also discuss use of nitroglycerin and activating EMS.  Review options of places to exercise outside of rehab.  Review guidelines for sex with heart disease. Flowsheet Row CARDIAC REHAB PHASE II EXERCISE from 08/30/2016 in [place]  Date  08/30/16   Instruction Review Code  2- meets goals/outcomes      Cardiac Drugs I:  -Group instruction provided by verbal instruction and written materials to support subject.  Instructor reviews cardiac drug classes: antiplatelets, anticoagulants, beta blockers, and statins.  Instructor discusses reasons, side effects, and lifestyle considerations for each drug class.   Cardiac Drugs II:  -Group instruction provided by verbal instruction and written materials to support subject.  Instructor reviews cardiac drug classes: angiotensin converting enzyme inhibitors (ACE-I), angiotensin II receptor blockers (ARBs), nitrates, and calcium channel blockers.  Instructor discusses reasons, side effects, and lifestyle considerations for each drug class. Flowsheet Row CARDIAC REHAB PHASE II EXERCISE from 08/30/2016 in [facility] [facility]  Date  08/21/16  Instruction Review Code  2- meets goals/outcomes      Anatomy and Physiology of the Circulatory System:  -Group instruction provided by verbal instruction, video, and written materials to support subject.  Reviews functional anatomy of heart, how it relates to various diagnoses, and what role the heart plays in the overall system. Flowsheet Row CARDIAC REHAB PHASE II EXERCISE from 08/30/2016 in [place]  Date  08/14/16  Instruction Review Code  2- meets goals/outcomes      Knowledge Questionnaire Score:     Knowledge Questionnaire Score - 08/01/16 1353      Knowledge Questionnaire Score   Pre Score 18/24      Core Components/Risk Factors/Patient Goals at Admission:     Personal Goals and Risk Factors at Admission - 08/01/16 5573      Core Components/Risk Factors/Patient Goals on Admission   Sedentary Yes   Intervention Provide advice, education, support and counseling about physical activity/exercise needs.;Develop an individualized exercise prescription for aerobic and resistive training  based on initial evaluation findings, risk stratification, comorbidities and participant's personal goals.   Expected Outcomes Achievement of increased cardiorespiratory fitness and enhanced flexibility, muscular endurance and strength shown through measurements of functional capacity and personal statement of participant.   Increase Strength and Stamina Yes   Intervention Provide advice, education, support and counseling about physical activity/exercise needs.;Develop an individualized exercise prescription for aerobic and resistive training based on initial evaluation findings, risk stratification, comorbidities and participant's personal goals.   Expected Outcomes Achievement of increased cardiorespiratory fitness and enhanced flexibility, muscular endurance and strength shown through measurements of functional capacity and personal statement of participant.   Improve shortness of breath with ADL's Yes   Intervention Provide education, individualized exercise plan and daily activity instruction to help decrease symptoms of SOB with activities of daily living.   Expected Outcomes Short Term: Achieves a reduction of symptoms when performing activities of daily living.   Heart Failure Yes   Intervention Provide a combined exercise and nutrition program that is supplemented with education, support and counseling about heart failure. Directed toward relieving symptoms such as shortness of breath, decreased exercise tolerance, and extremity edema.   Expected Outcomes Improve functional capacity of life   Personal Goal Other Yes   Personal Goal Increase leg strength, and breathing while walking and doing yard work.  Intervention Individualized exercise program   Expected Outcomes increase functional capacity      Core Components/Risk Factors/Patient Goals Review:      Goals and Risk Factor Review    Row Name 08/21/16 1304             Core Components/Risk Factors/Patient Goals Review    Personal Goals Review Other;Increase Strength and Stamina       Review Pt states" getting stronger and feeling better" Breathing and SOB symptoms are improving. Pt is exercising at home and find it to be beneficial       Expected Outcomes Pt will continue with HEP and be able to exercise without difficulty or complications          Core Components/Risk Factors/Patient Goals at Discharge (Final Review):      Goals and Risk Factor Review - 08/21/16 1304      Core Components/Risk Factors/Patient Goals Review   Personal Goals Review Other;Increase Strength and Stamina   Review Pt states" getting stronger and feeling better" Breathing and SOB symptoms are improving. Pt is exercising at home and find it to be beneficial   Expected Outcomes Pt will continue with HEP and be able to exercise without difficulty or complications      ITP Comments:     ITP Comments    Row Name 08/01/16 1410           ITP Comments Dr. Fransico Him, Medical Director          Comments:  Pt is making  progress toward personal goals after completing 11 sessions. Pt is hold presently due to recent hospitalizaiton due to respiratory failure.  Pt is now on home o2 and is receiving physical therapy in the home.  Will be able to resume CR when he is finished with home Physical therapy. Recommend continued exercise and life style modification education including  stress management and relaxation techniques to decrease cardiac risk profile. Cherre Huger, BSN

## 2016-09-20 ENCOUNTER — Encounter (HOSPITAL_COMMUNITY): Admission: RE | Admit: 2016-09-20 | Payer: Medicare Other | Source: Ambulatory Visit

## 2016-09-23 ENCOUNTER — Encounter (HOSPITAL_COMMUNITY): Payer: Medicare Other

## 2016-09-24 DIAGNOSIS — N184 Chronic kidney disease, stage 4 (severe): Secondary | ICD-10-CM | POA: Diagnosis not present

## 2016-09-24 DIAGNOSIS — I2510 Atherosclerotic heart disease of native coronary artery without angina pectoris: Secondary | ICD-10-CM | POA: Diagnosis not present

## 2016-09-24 DIAGNOSIS — I5022 Chronic systolic (congestive) heart failure: Secondary | ICD-10-CM | POA: Diagnosis not present

## 2016-09-24 DIAGNOSIS — I130 Hypertensive heart and chronic kidney disease with heart failure and stage 1 through stage 4 chronic kidney disease, or unspecified chronic kidney disease: Secondary | ICD-10-CM | POA: Diagnosis not present

## 2016-09-24 DIAGNOSIS — J209 Acute bronchitis, unspecified: Secondary | ICD-10-CM | POA: Diagnosis not present

## 2016-09-24 DIAGNOSIS — J440 Chronic obstructive pulmonary disease with acute lower respiratory infection: Secondary | ICD-10-CM | POA: Diagnosis not present

## 2016-09-25 ENCOUNTER — Encounter (HOSPITAL_COMMUNITY): Payer: Medicare Other

## 2016-09-26 DIAGNOSIS — J209 Acute bronchitis, unspecified: Secondary | ICD-10-CM | POA: Diagnosis not present

## 2016-09-26 DIAGNOSIS — J440 Chronic obstructive pulmonary disease with acute lower respiratory infection: Secondary | ICD-10-CM | POA: Diagnosis not present

## 2016-09-26 DIAGNOSIS — I5022 Chronic systolic (congestive) heart failure: Secondary | ICD-10-CM | POA: Diagnosis not present

## 2016-09-26 DIAGNOSIS — I2510 Atherosclerotic heart disease of native coronary artery without angina pectoris: Secondary | ICD-10-CM | POA: Diagnosis not present

## 2016-09-26 DIAGNOSIS — N184 Chronic kidney disease, stage 4 (severe): Secondary | ICD-10-CM | POA: Diagnosis not present

## 2016-09-26 DIAGNOSIS — I130 Hypertensive heart and chronic kidney disease with heart failure and stage 1 through stage 4 chronic kidney disease, or unspecified chronic kidney disease: Secondary | ICD-10-CM | POA: Diagnosis not present

## 2016-09-27 ENCOUNTER — Encounter (HOSPITAL_COMMUNITY): Payer: Medicare Other

## 2016-09-30 ENCOUNTER — Encounter (HOSPITAL_COMMUNITY): Admission: RE | Admit: 2016-09-30 | Payer: Medicare Other | Source: Ambulatory Visit

## 2016-09-30 DIAGNOSIS — D485 Neoplasm of uncertain behavior of skin: Secondary | ICD-10-CM | POA: Diagnosis not present

## 2016-09-30 DIAGNOSIS — L570 Actinic keratosis: Secondary | ICD-10-CM | POA: Diagnosis not present

## 2016-09-30 DIAGNOSIS — C44219 Basal cell carcinoma of skin of left ear and external auricular canal: Secondary | ICD-10-CM | POA: Diagnosis not present

## 2016-09-30 DIAGNOSIS — Z85828 Personal history of other malignant neoplasm of skin: Secondary | ICD-10-CM | POA: Diagnosis not present

## 2016-10-01 DIAGNOSIS — I130 Hypertensive heart and chronic kidney disease with heart failure and stage 1 through stage 4 chronic kidney disease, or unspecified chronic kidney disease: Secondary | ICD-10-CM | POA: Diagnosis not present

## 2016-10-01 DIAGNOSIS — N184 Chronic kidney disease, stage 4 (severe): Secondary | ICD-10-CM | POA: Diagnosis not present

## 2016-10-01 DIAGNOSIS — I2510 Atherosclerotic heart disease of native coronary artery without angina pectoris: Secondary | ICD-10-CM | POA: Diagnosis not present

## 2016-10-01 DIAGNOSIS — I5022 Chronic systolic (congestive) heart failure: Secondary | ICD-10-CM | POA: Diagnosis not present

## 2016-10-01 DIAGNOSIS — J209 Acute bronchitis, unspecified: Secondary | ICD-10-CM | POA: Diagnosis not present

## 2016-10-01 DIAGNOSIS — J440 Chronic obstructive pulmonary disease with acute lower respiratory infection: Secondary | ICD-10-CM | POA: Diagnosis not present

## 2016-10-02 ENCOUNTER — Encounter (HOSPITAL_COMMUNITY): Payer: Medicare Other

## 2016-10-03 DIAGNOSIS — J209 Acute bronchitis, unspecified: Secondary | ICD-10-CM | POA: Diagnosis not present

## 2016-10-03 DIAGNOSIS — I130 Hypertensive heart and chronic kidney disease with heart failure and stage 1 through stage 4 chronic kidney disease, or unspecified chronic kidney disease: Secondary | ICD-10-CM | POA: Diagnosis not present

## 2016-10-03 DIAGNOSIS — J440 Chronic obstructive pulmonary disease with acute lower respiratory infection: Secondary | ICD-10-CM | POA: Diagnosis not present

## 2016-10-03 DIAGNOSIS — I5022 Chronic systolic (congestive) heart failure: Secondary | ICD-10-CM | POA: Diagnosis not present

## 2016-10-03 DIAGNOSIS — N184 Chronic kidney disease, stage 4 (severe): Secondary | ICD-10-CM | POA: Diagnosis not present

## 2016-10-03 DIAGNOSIS — I2510 Atherosclerotic heart disease of native coronary artery without angina pectoris: Secondary | ICD-10-CM | POA: Diagnosis not present

## 2016-10-04 ENCOUNTER — Encounter (HOSPITAL_COMMUNITY): Payer: Medicare Other

## 2016-10-07 ENCOUNTER — Encounter (HOSPITAL_COMMUNITY): Payer: Medicare Other

## 2016-10-07 DIAGNOSIS — I5022 Chronic systolic (congestive) heart failure: Secondary | ICD-10-CM | POA: Insufficient documentation

## 2016-10-07 DIAGNOSIS — I4891 Unspecified atrial fibrillation: Secondary | ICD-10-CM | POA: Insufficient documentation

## 2016-10-07 DIAGNOSIS — I2510 Atherosclerotic heart disease of native coronary artery without angina pectoris: Secondary | ICD-10-CM | POA: Insufficient documentation

## 2016-10-07 DIAGNOSIS — I110 Hypertensive heart disease with heart failure: Secondary | ICD-10-CM | POA: Insufficient documentation

## 2016-10-07 DIAGNOSIS — E785 Hyperlipidemia, unspecified: Secondary | ICD-10-CM | POA: Insufficient documentation

## 2016-10-08 ENCOUNTER — Other Ambulatory Visit (HOSPITAL_COMMUNITY): Payer: Self-pay | Admitting: Internal Medicine

## 2016-10-09 ENCOUNTER — Encounter (HOSPITAL_COMMUNITY): Payer: Medicare Other

## 2016-10-11 ENCOUNTER — Other Ambulatory Visit (HOSPITAL_COMMUNITY): Payer: Self-pay | Admitting: Internal Medicine

## 2016-10-11 ENCOUNTER — Encounter (HOSPITAL_COMMUNITY)
Admission: RE | Admit: 2016-10-11 | Discharge: 2016-10-11 | Disposition: A | Discharge status: Home / Self Care | Payer: Medicare Other | Source: Ambulatory Visit | Attending: Internal Medicine | Admitting: Internal Medicine

## 2016-10-11 DIAGNOSIS — I5022 Chronic systolic (congestive) heart failure: Secondary | ICD-10-CM

## 2016-10-12 DIAGNOSIS — I509 Heart failure, unspecified: Secondary | ICD-10-CM | POA: Diagnosis not present

## 2016-10-12 DIAGNOSIS — R0989 Other specified symptoms and signs involving the circulatory and respiratory systems: Secondary | ICD-10-CM | POA: Diagnosis not present

## 2016-10-12 DIAGNOSIS — I480 Paroxysmal atrial fibrillation: Secondary | ICD-10-CM | POA: Diagnosis not present

## 2016-10-12 DIAGNOSIS — R5383 Other fatigue: Secondary | ICD-10-CM | POA: Diagnosis not present

## 2016-10-12 DIAGNOSIS — J441 Chronic obstructive pulmonary disease with (acute) exacerbation: Secondary | ICD-10-CM | POA: Diagnosis not present

## 2016-10-12 DIAGNOSIS — I2510 Atherosclerotic heart disease of native coronary artery without angina pectoris: Secondary | ICD-10-CM | POA: Diagnosis not present

## 2016-10-12 DIAGNOSIS — Z6826 Body mass index (BMI) 26.0-26.9, adult: Secondary | ICD-10-CM | POA: Diagnosis not present

## 2016-10-12 DIAGNOSIS — R05 Cough: Secondary | ICD-10-CM | POA: Diagnosis not present

## 2016-10-12 DIAGNOSIS — N184 Chronic kidney disease, stage 4 (severe): Secondary | ICD-10-CM | POA: Diagnosis not present

## 2016-10-14 ENCOUNTER — Encounter (HOSPITAL_COMMUNITY): Payer: Medicare Other

## 2016-10-15 ENCOUNTER — Telehealth (HOSPITAL_COMMUNITY): Payer: Self-pay | Admitting: Internal Medicine

## 2016-10-15 ENCOUNTER — Other Ambulatory Visit (HOSPITAL_COMMUNITY): Payer: Self-pay | Admitting: *Deleted

## 2016-10-15 NOTE — Telephone Encounter (Signed)
Pt's wife confirmed apt for spouse for 10/16/16 .Marland Kitchen..KJ

## 2016-10-16 ENCOUNTER — Encounter (HOSPITAL_COMMUNITY)
Admission: RE | Admit: 2016-10-16 | Discharge: 2016-10-16 | Disposition: A | Discharge status: Home / Self Care | Payer: Medicare Other | Source: Ambulatory Visit | Attending: Internal Medicine | Admitting: Internal Medicine

## 2016-10-16 DIAGNOSIS — I110 Hypertensive heart disease with heart failure: Secondary | ICD-10-CM | POA: Diagnosis not present

## 2016-10-16 DIAGNOSIS — I5022 Chronic systolic (congestive) heart failure: Secondary | ICD-10-CM | POA: Diagnosis not present

## 2016-10-16 DIAGNOSIS — E785 Hyperlipidemia, unspecified: Secondary | ICD-10-CM | POA: Diagnosis not present

## 2016-10-16 DIAGNOSIS — I4891 Unspecified atrial fibrillation: Secondary | ICD-10-CM | POA: Insufficient documentation

## 2016-10-16 DIAGNOSIS — I2510 Atherosclerotic heart disease of native coronary artery without angina pectoris: Secondary | ICD-10-CM | POA: Diagnosis not present

## 2016-10-17 NOTE — Progress Notes (Signed)
Cardiac Individual Treatment Plan  Patient Details  Name: [name] MRN: [id number] Date of Birth: Sep 30, 1932 Referring Provider:   Flowsheet Row CARDIAC REHAB PHASE II ORIENTATION from 08/01/2016 in [facility]  Referring Provider  Glori Bickers MD      Initial Encounter Date:  [place] PHASE II ORIENTATION from 08/01/2016 in [place]  Date  08/01/16  Referring Provider  Glori Bickers MD      Visit Diagnosis: 6/73/41, Systolic CHF, chronic ([place])  Patient's Home Medications on Admission:  Current Outpatient Prescriptions:  .  acetaminophen (TYLENOL) 325 MG tablet, Take 2 tablets (650 mg total) by mouth every 6 (six) hours as needed for mild pain (or Fever >/= 101)., Disp: , Rfl:  .  amiodarone (PACERONE) 100 MG tablet, Take 1 tablet (100 mg total) by mouth daily., Disp: , Rfl:  .  azithromycin (ZITHROMAX) 250 MG tablet, Take 1 tablet (250 mg total) by mouth daily., Disp: 1 tablet, Rfl: 0 .  benzonatate (TESSALON) 200 MG capsule, Take 200 mg by mouth 3 (three) times daily as needed for cough., Disp: , Rfl:  .  carvedilol (COREG) 6.25 MG tablet, TAKE ONE TABLET BY MOUTH TWICE DAILY WITH MEALS, Disp: 60 tablet, Rfl: 3 .  diphenhydramine-acetaminophen (TYLENOL PM) 25-500 MG TABS tablet, Take 1 tablet by mouth at bedtime as needed (sleep)., Disp: , Rfl:  .  ELIQUIS 2.5 MG TABS tablet, TAKE ONE TABLET BY MOUTH TWICE DAILY, Disp: 60 tablet, Rfl: 3 .  ezetimibe (ZETIA) 10 MG tablet, TAKE ONE TABLET BY MOUTH EVERY DAY WITH  SUPPER, Disp: 90 tablet, Rfl: 3 .  ferrous sulfate ([employer] IRON) 325 (65 FE) MG tablet, Take 325 mg by mouth every Monday, Wednesday, and Friday. , Disp: , Rfl:  .  furosemide (LASIX) 20 MG tablet, Take 20 mg by mouth daily. , Disp: , Rfl:  .  hydrOXYzine (ATARAX/VISTARIL) 25 MG tablet, Take 1 tablet by mouth at bedtime. Reported on 10/19/2015, Disp: , Rfl:  .  KLOR-CON M10 10  MEQ tablet, Take 10 mEq by mouth daily. , Disp: , Rfl:  .  levothyroxine (SYNTHROID, LEVOTHROID) 50 MCG tablet, Take 25-50 mcg by mouth daily before breakfast. Take 50 mcg (1 tablet) on Tues, Thurs, Sat, Sun and 25 mcg (1/2 tablet) on Mon, Wed, Fri, Disp: , Rfl:  .  Magnesium Hydroxide (MILK OF MAGNESIA PO), Take 20 mLs by mouth daily as needed (for constipation)., Disp: , Rfl:  .  Multiple Vitamins-Minerals (CENTRUM SILVER PO), Take 1 tablet by mouth daily. , Disp: , Rfl:  .  NITROSTAT 0.4 MG SL tablet, PLACE ONE TABLET UNDER THE TONGUE EVERY 5 MINUTES AS NEEDED FOR CHEST PAIN, Disp: 25 tablet, Rfl: 0 .  pantoprazole (PROTONIX) 40 MG tablet, Take 40 mg by mouth daily. , Disp: , Rfl:  .  predniSONE (DELTASONE) 20 MG tablet, Take 2 tablets (40 mg total) by mouth daily with breakfast., Disp: 2 tablet, Rfl: 0 .  Pumpkin Seed-Soy Germ (AZO BLADDER CONTROL/GO-LESS) CAPS, Take 1 capsule by mouth as needed (for bladder incontinence). , Disp: , Rfl:  .  rosuvastatin (CRESTOR) 20 MG tablet, Take 1 tablet (20 mg total) by mouth daily., Disp: 90 tablet, Rfl: 3 .  sodium chloride (OCEAN) 0.65 % SOLN nasal spray, Place 1 spray into both nostrils as needed for congestion., Disp: , Rfl:  .  tamsulosin (FLOMAX) 0.4 MG CAPS capsule, Take 1 capsule by mouth at bedtime.,  Disp: , Rfl:  .  valsartan (DIOVAN) 80 MG tablet, TAKE ONE-HALF TABLET BY MOUTH ONCE DAILY IF SYSTOLIC BLOOD PRESSURE IS GREATER THAN 100, Disp: 30 tablet, Rfl: 3  Past Medical History: Past Medical History:  Diagnosis Date  . Acute bronchitis with COPD ([place]) 09/10/2016  . Anemia   . Atrial fibrillation or flutter    maintaining sinus on amiodarone    . Bladder cancer ([place]) dx'd 05/2012  . CAD (coronary artery disease)     a. s/p anterior MI 12/05 c/b shock -> stent LAD   b. s/p stenting OM-1, 2/06  . CHF (congestive heart failure) ([facility])    due to ischemic CM  a. EF 20-30%. (Nov 2008)   b. s/p [employer] BiV-ICD    c. CPX 07/2008  pvo2 16.3 (63%  predicted) slope 34 RER 1.08 O2 pulse 93%  . Cough    occasional /productive, no fever/ not new  . CRI (chronic renal insufficiency)    (baseline 2.0-2.2)/ recent hospitalization 8/13  . GERD (gastroesophageal reflux disease)    hx Barretts esophagitis  . History of blood transfusion    following coumadin  usage  . HTN (hypertension)    EKG 05/14/12,Chest x ray 8/13, last ICD interrogation 8/13 EPIC  . Hyperlipidemia   . Hypothyroidism   . 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 Skin cancer    basal cells   facial x 4, 1 right forearm  . Sleep apnea    STOP BANG SCORE 4    Tobacco Use: History  Smoking Status  . Former Smoker  . Types: Cigarettes, Cigars  . Quit date: 03/02/2004  Smokeless Tobacco  . Former Systems developer  . Quit date: 05/20/1999    Comment: quit in 2005    Labs: Recent Review Flowsheet Data    Labs for ITP Cardiac and Pulmonary Rehab Latest Ref Rng & Units 11/13/2006 10/06/2007 11/02/2007 04/25/2008 08/15/2013   Cholestrol 0 - 200 mg/dL 111 - 121 90 -   LDLCALC 0 - 99 mg/dL 66 - 73 47 -   HDL >39.0 mg/dL 30.3(L) - 30.7(L) 29.3(L) -   Trlycerides 0 - 149 mg/dL 75 - 85 67 -   HCO3 - - 26.1(H) - - -   TCO2 0 - 100 mmol/L - 27 - - 22      Capillary Blood Glucose: No results found for: GLUCAP   Exercise Target Goals:    Exercise Program Goal: Individual exercise prescription set with THRR, safety & activity barriers. Participant demonstrates ability to understand and report RPE using BORG scale, to self-measure pulse accurately, and to acknowledge the importance of the exercise prescription.  Exercise Prescription Goal: Starting with aerobic activity 30 plus minutes a day, 3 days per week for initial exercise prescription. Provide home exercise prescription and guidelines that participant acknowledges understanding prior to  discharge.  Activity Barriers & Risk Stratification:     Activity Barriers & Cardiac Risk Stratification - 08/01/16 1406      Activity Barriers & Cardiac Risk Stratification   Cardiac Risk Stratification High      6 Minute Walk:     6 Minute Walk    Row Name 08/01/16 1355         6 Minute Walk   Phase Initial     Distance 1363 feet     Walk Time 6 minutes     #  of Rest Breaks 0     MPH 2.58     METS 2.3     RPE 9     VO2 Peak 8.22     Symptoms No     Resting HR 65 bpm     Resting BP 119/75     Max Ex. HR 95 bpm     Max Ex. BP 118/60     2 Minute Post BP 116/68        Initial Exercise Prescription:     Initial Exercise Prescription - 08/01/16 1400      Date of Initial Exercise RX and Referring Provider   Date 08/01/16   Referring Provider Bensimhon, Daniel MD     Recumbant Bike   Level 2   Minutes 10   METs 2     NuStep   Level 3   Minutes 10   METs 2     Cybex   Level 2   Minutes 10   METs 2     Track   Laps 9   Minutes 10   METs 2.39     Prescription Details   Frequency (times per week) 3   Duration Progress to 30 minutes of continuous aerobic without signs/symptoms of physical distress     Intensity   THRR 40-80% of Max Heartrate 53-110   Ratings of Perceived Exertion 11-13     Resistance Training   Training Prescription Yes   Weight 2lbs   Reps 10-12      Perform Capillary Blood Glucose checks as needed.  Exercise Prescription Changes:     Exercise Prescription Changes    Row Name 08/07/16 1700 08/20/16 1000 09/02/16 1204 10/17/16 1400       Exercise Review   Progression Yes Yes  - Yes      Response to Exercise   Blood Pressure (Admit) 110/64 106/62 128/68 100/60    Blood Pressure (Exercise) 122/66 118/60 120/60 118/60    Blood Pressure (Exit) 102/60 100/68 100/70 96/60    Heart Rate (Admit) 88 bpm 63 bpm 64 bpm 79 bpm    Heart Rate (Exercise) 96 bpm 97 bpm 95 bpm 96 bpm    Heart Rate (Exit) 76 bpm 60 bpm 40 bpm 66  bpm    Rating of Perceived Exertion (Exercise) '11 11 11 11    '$ Symptoms none none none none    Comments  -  - reviewed HEP on 08/23/16 reviewed HEP on 08/14/16    Duration Progress to 30 minutes of continuous aerobic without signs/symptoms of physical distress Progress to 30 minutes of continuous aerobic without signs/symptoms of physical distress Progress to 30 minutes of continuous aerobic without signs/symptoms of physical distress Progress to 30 minutes of continuous aerobic without signs/symptoms of physical distress    Intensity THRR unchanged THRR unchanged THRR unchanged THRR unchanged      Progression   Progression Continue to progress workloads to maintain intensity without signs/symptoms of physical distress. Continue to progress workloads to maintain intensity without signs/symptoms of physical distress. Continue progressive overload as per policy without signs/symptoms or physical distress. Continue progressive overload as per policy without signs/symptoms or physical distress.    Average METs 2.2 2.4  - 2.6      Resistance Training   Training Prescription Yes Yes  - Yes    Weight 2lbs 5lbs  - 3lbs  reduced weights due to a layoff of 7 weeks    Reps 10-12 10-12  - 10-12  Interval Training   Interval Training No No  - No      Recumbant Bike   Level 2 2  - 3  upright scifit    Minutes 10 10  - 10    METs - 2.8  - 2.8      NuStep   Level 3 3  - 3    Minutes 10 10  - 10    METs 2.2 2.1  - 2.4      Cybex   Level -  -  -  -    Minutes -  -  -  -    METs -  -  -  -      Track   Laps 7 10  - 10    Minutes 10 10  - 10    METs 2.23 2.74  - 2.74      Home Exercise Plan   Plans to continue exercise at  -  -  - Home  Reviewed on 08/14/16 see progress note    Frequency  -  -  - Add 2 additional days to program exercise sessions.       Exercise Comments:     Exercise Comments    Row Name 08/07/16 1704 08/21/16 1303 09/17/16 1203 10/17/16 1443     Exercise  Comments Participant off to a good start with exercise. Reviewed METs and goals. Pt is tolerating exercise well; will continue to monitor exercise progression. Pt last session at [facility] was on 09/02/16. Pt was admitted to ED for acute bronchitis with COPD and acute respiratory failure with hypoxia Pt returned to [facility] on 10/17/16. Pt responded well to Ex Rx. Reviewed goals. Will monitor exercise progression.       Discharge Exercise Prescription (Final Exercise Prescription Changes):     Exercise Prescription Changes - 10/17/16 1400      Exercise Review   Progression Yes     Response to Exercise   Blood Pressure (Admit) 100/60   Blood Pressure (Exercise) 118/60   Blood Pressure (Exit) 96/60   Heart Rate (Admit) 79 bpm   Heart Rate (Exercise) 96 bpm   Heart Rate (Exit) 66 bpm   Rating of Perceived Exertion (Exercise) 11   Symptoms none   Comments reviewed HEP on 08/14/16   Duration Progress to 30 minutes of continuous aerobic without signs/symptoms of physical distress   Intensity THRR unchanged     Progression   Progression Continue progressive overload as per policy without signs/symptoms or physical distress.   Average METs 2.6     Resistance Training   Training Prescription Yes   Weight 3lbs  reduced weights due to a layoff of 7 weeks   Reps 10-12     Interval Training   Interval Training No     Recumbant Bike   Level 3  upright scifit   Minutes 10   METs 2.8     NuStep   Level 3   Minutes 10   METs 2.4     Track   Laps 10   Minutes 10   METs 2.74     Home Exercise Plan   Plans to continue exercise at Home  Reviewed on 08/14/16 see progress note   Frequency Add 2 additional days to program exercise sessions.      Nutrition:  Target Goals: Understanding of nutrition guidelines, daily intake of sodium '1500mg'$ , cholesterol '200mg'$ , calories 30% from fat and 7% or less from saturated fats, daily to have  5 or more servings of fruits and  vegetables.  Biometrics:     Pre Biometrics - 08/01/16 1358      Pre Biometrics   Height '5\' 10"'$  (1.778 m)   Weight 182 lb 5.1 oz (82.7 kg)   Waist Circumference 42 inches   Hip Circumference 42 inches   Waist to Hip Ratio 1 %   BMI (Calculated) 26.2   Triceps Skinfold 23 mm   % Body Fat 30 %   Grip Strength 34 kg   Flexibility 7.5 in   Single Leg Stand 0 seconds       Nutrition Therapy Plan and Nutrition Goals:     Nutrition Therapy & Goals - 08/05/16 1036      Nutrition Therapy   Diet Therapeutic Lifestyle Changes     Personal Nutrition Goals   Personal Goal #1 1-2 lb wt loss/week to a wt loss goal of 6-15 lb at graduation from [facility], educate and counsel regarding individualized specific dietary modifications aiming towards targeted core components such as weight, hypertension, lipid management, diabetes, heart failure and other comorbidities.   Expected Outcomes Short Term Goal: Understand basic principles of dietary content, such as calories, fat, sodium, cholesterol and nutrients.;Long Term Goal: Adherence to prescribed nutrition plan.      Nutrition Discharge: Nutrition Scores:     Nutrition Assessments - 08/05/16 1036      MEDFICTS Scores   Pre Score 106      Nutrition Goals Re-Evaluation:   Psychosocial: Target Goals: Acknowledge presence or absence of depression, maximize coping skills, provide positive support system. Participant is able to verbalize types and ability to use techniques and skills needed for reducing stress and depression.  Initial Review & Psychosocial Screening:     Initial Psych Review & Screening - 08/01/16 1618      Family Dynamics   Good Support System? Yes  Pt wife accompanied pt to orientation   Comments No psychosocial needs identified, will continue to monitor and re evaluate as needed.     Barriers   Psychosocial barriers to participate in program There are no  identifiable barriers or psychosocial needs.      Quality of Life Scores:     Quality of Life - 08/01/16 1400      Quality of Life Scores   Health/Function Pre 22.89 %   Socioeconomic Pre 22 %   Psych/Spiritual Pre 27.43 %   Family Pre 28.8 %   GLOBAL Pre 25.08 %      PHQ-9: Recent Review Flowsheet Data    Depression screen [facility] 2/9 08/01/2016   Decreased Interest 0   Down, Depressed, Hopeless 0   PHQ - 2 Score 0      Psychosocial Evaluation and Intervention:     Psychosocial Evaluation - 10/17/16 1634      Psychosocial Evaluation & Interventions   Interventions Encouraged to exercise with the program and follow exercise prescription   Comments Pt has supportive wife who accompanies him to rehab.   Continued Psychosocial Services Needed No      Psychosocial Re-Evaluation:     Psychosocial Re-Evaluation    [place] Name 08/22/16 1436 09/19/16 1432 10/17/16 1635         Psychosocial Re-Evaluation   Interventions Encouraged to attend Cardiac Rehabilitation for the exercise Encouraged to attend Cardiac Rehabilitation for the exercise Encouraged to attend Cardiac Rehabilitation for the exercise     Comments  -  -  Pt returned to rehab on 10/16/16.  Pt appears to be doing well and is pleased he was able to come off the oxygen therapy     Continued Psychosocial Services Needed  - No  -        Vocational Rehabilitation: Provide vocational rehab assistance to qualifying candidates.   Vocational Rehab Evaluation & Intervention:     Vocational Rehab - 08/01/16 1620      Initial Vocational Rehab Evaluation & Intervention   Assessment shows need for Vocational Rehabilitation No  Pt does not plan to return to work.  Pt is retired.      Education: Education Goals: Education classes will be provided on a weekly basis, covering required topics. Participant will state understanding/return demonstration of topics presented.  Learning Barriers/Preferences:     Learning  Barriers/Preferences - 08/01/16 0813      Learning Barriers/Preferences   Learning Barriers Sight   Learning Preferences Video      Education Topics: Count Your Pulse:  -Group instruction provided by verbal instruction, demonstration, patient participation and written materials to support subject.  Instructors address importance of being able to find your pulse and how to count your pulse when at home without a heart monitor.  Patients get hands on experience counting their pulse with staff help and individually.   Heart Attack, Angina, and Risk Factor Modification:  -Group instruction provided by verbal instruction, video, and written materials to support subject.  Instructors address signs and symptoms of angina and heart attacks.    Also discuss risk factors for heart disease and how to make changes to improve heart health risk factors.   Functional Fitness:  -Group instruction provided by verbal instruction, demonstration, patient participation, and written materials to support subject.  Instructors address safety measures for doing things around the house.  Discuss how to get up and down off the floor, how to pick things up properly, how to safely get out of a chair without assistance, and balance training.   Meditation and Mindfulness:  -Group instruction provided by verbal instruction, patient participation, and written materials to support subject.  Instructor addresses importance of mindfulness and meditation practice to help reduce stress and improve awareness.  Instructor also leads participants through a meditation exercise.  Flowsheet Row CARDIAC REHAB PHASE II EXERCISE from 10/16/2016 in [place]  Date  08/07/16  Instruction Review Code  2- meets goals/outcomes      Stretching for Flexibility and Mobility:  -Group instruction provided by verbal instruction, patient participation, and written materials to support subject.  Instructors lead  participants through series of stretches that are designed to increase flexibility thus improving mobility.  These stretches are additional exercise for major muscle groups that are typically performed during regular warm up and cool down.   Hands Only CPR Anytime:  -Group instruction provided by verbal instruction, video, patient participation and written materials to support subject.  Instructors co-teach with AHA video for hands only CPR.  Participants get hands on experience with mannequins.   Nutrition I class: Heart Healthy Eating:  -Group instruction provided by PowerPoint slides, verbal discussion, and written materials to support subject matter. The instructor gives an explanation and review of the Therapeutic Lifestyle Changes diet recommendations, which includes a discussion on lipid goals, dietary fat, sodium, fiber, plant stanol/sterol esters, sugar, and the components of a well-balanced, healthy diet.   Nutrition II class: Lifestyle Skills:  -Group instruction provided by PowerPoint slides, verbal discussion, and written materials to  support subject matter. The instructor gives an explanation and review of label reading, grocery shopping for heart health, heart healthy recipe modifications, and ways to make healthier choices when eating out.   Diabetes Question & Answer:  -Group instruction provided by PowerPoint slides, verbal discussion, and written materials to support subject matter. The instructor gives an explanation and review of diabetes co-morbidities, pre- and post-prandial blood glucose goals, pre-exercise blood glucose goals, signs, symptoms, and treatment of hypoglycemia and hyperglycemia, and foot care basics.   Diabetes Blitz:  -Group instruction provided by PowerPoint slides, verbal discussion, and written materials to support subject matter. The instructor gives an explanation and review of the physiology behind type 1 and type 2 diabetes, diabetes medications and  rational behind using different medications, pre- and post-prandial blood glucose recommendations and Hemoglobin A1c goals, diabetes diet, and exercise including blood glucose guidelines for exercising safely.    Portion Distortion:  -Group instruction provided by PowerPoint slides, verbal discussion, written materials, and food models to support subject matter. The instructor gives an explanation of serving size versus portion size, changes in portions sizes over the last 20 years, and what consists of a serving from each food group. Flowsheet Row CARDIAC REHAB PHASE II EXERCISE from 10/16/2016 in [place]  Date  10/16/16  Educator  RD  Instruction Review Code  2- meets goals/outcomes      Stress Management:  -Group instruction provided by verbal instruction, video, and written materials to support subject matter.  Instructors review role of stress in heart disease and how to cope with stress positively.   Flowsheet Row CARDIAC REHAB PHASE II EXERCISE from 10/16/2016 in [place]  Date  08/28/16  Instruction Review Code  2- meets goals/outcomes      Exercising on Your Own:  -Group instruction provided by verbal instruction, power point, and written materials to support subject.  Instructors discuss benefits of exercise, components of exercise, frequency and intensity of exercise, and end points for exercise.  Also discuss use of nitroglycerin and activating EMS.  Review options of places to exercise outside of rehab.  Review guidelines for sex with heart disease. Flowsheet Row CARDIAC REHAB PHASE II EXERCISE from 10/16/2016 in [place]  Date  08/30/16  Instruction Review Code  2- meets goals/outcomes      Cardiac Drugs I:  -Group instruction provided by verbal instruction and written materials to support subject.  Instructor reviews cardiac drug classes: antiplatelets, anticoagulants, beta  blockers, and statins.  Instructor discusses reasons, side effects, and lifestyle considerations for each drug class.   Cardiac Drugs II:  -Group instruction provided by verbal instruction and written materials to support subject.  Instructor reviews cardiac drug classes: angiotensin converting enzyme inhibitors (ACE-I), angiotensin II receptor blockers (ARBs), nitrates, and calcium channel blockers.  Instructor discusses reasons, side effects, and lifestyle considerations for each drug class. Flowsheet Row CARDIAC REHAB PHASE II EXERCISE from 10/16/2016 in [place]  Date  08/21/16  Instruction Review Code  2- meets goals/outcomes      Anatomy and Physiology of the Circulatory System:  -Group instruction provided by verbal instruction, video, and written materials to support subject.  Reviews functional anatomy of heart, how it relates to various diagnoses, and what role the heart plays in the overall system. Flowsheet Row CARDIAC REHAB PHASE II EXERCISE from 10/16/2016 in [place]  Date  08/14/16  Instruction  Review Code  2- meets goals/outcomes      Knowledge Questionnaire Score:     Knowledge Questionnaire Score - 08/01/16 1353      Knowledge Questionnaire Score   Pre Score 18/24      Core Components/Risk Factors/Patient Goals at Admission:     Personal Goals and Risk Factors at Admission - 08/01/16 0822      Core Components/Risk Factors/Patient Goals on Admission   Sedentary Yes   Intervention Provide advice, education, support and counseling about physical activity/exercise needs.;Develop an individualized exercise prescription for aerobic and resistive training based on initial evaluation findings, risk stratification, comorbidities and participant's personal goals.   Expected Outcomes Achievement of increased cardiorespiratory fitness and enhanced flexibility, muscular endurance and strength shown through  measurements of functional capacity and personal statement of participant.   Increase Strength and Stamina Yes   Intervention Provide advice, education, support and counseling about physical activity/exercise needs.;Develop an individualized exercise prescription for aerobic and resistive training based on initial evaluation findings, risk stratification, comorbidities and participant's personal goals.   Expected Outcomes Achievement of increased cardiorespiratory fitness and enhanced flexibility, muscular endurance and strength shown through measurements of functional capacity and personal statement of participant.   Improve shortness of breath with ADL's Yes   Intervention Provide education, individualized exercise plan and daily activity instruction to help decrease symptoms of SOB with activities of daily living.   Expected Outcomes Short Term: Achieves a reduction of symptoms when performing activities of daily living.   Heart Failure Yes   Intervention Provide a combined exercise and nutrition program that is supplemented with education, support and counseling about heart failure. Directed toward relieving symptoms such as shortness of breath, decreased exercise tolerance, and extremity edema.   Expected Outcomes Improve functional capacity of life   Personal Goal Other Yes   Personal Goal Increase leg strength, and breathing while walking and doing yard work.    Intervention Individualized exercise program   Expected Outcomes increase functional capacity      Core Components/Risk Factors/Patient Goals Review:      Goals and Risk Factor Review    Row Name 08/21/16 1304 10/17/16 1446           Core Components/Risk Factors/Patient Goals Review   Personal Goals Review Other;Increase Strength and Stamina  -      Review Pt states" getting stronger and feeling better" Breathing and SOB symptoms are improving. Pt is exercising at home and find it to be beneficial Pt states that breathing  capacity is improving. Pt uses IS 5-10x, 2x/day. Pt reached 795m without difficulty. 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       Expected Outcomes Pt will continue with HEP and be able to exercise without difficulty or complications Pt will continue to improve in breathing capacity and respond well to exercise program at cardiac rehab.         Core Components/Risk Factors/Patient Goals at Discharge (Final Review):      Goals and Risk Factor Review - 10/17/16 1446      Core Components/Risk Factors/Patient Goals Review   Review Pt states that breathing capacity is improving. Pt uses IS 5-10x, 2x/day. Pt reached 7524mwithout difficulty. 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    Expected Outcomes Pt will continue to improve in breathing capacity and respond well to exercise program at cardiac rehab.  ITP Comments:     ITP Comments    Row Name 08/01/16 1410 10/11/16 1126         ITP Comments Dr. Fransico Him, Medical Director Attended CPR education class; goals and outcomes met.         Comments:  Pt is making expected progress toward personal goals after completing 12 sessions Pt returned to exercise on yesterday after being hospitalized for Pneumonia. Pt  did well.  Pt was very pleased that he was able to return and he no longer needed oxygen therapy. Psychosocial Assessment - Pt wife accompanies him to rehab.  Pt is engaged in the rehab process, demonstrating positive and humorous outlook on life  Recommend continued exercise and life style modification education including  stress management and relaxation techniques to decrease cardiac risk profile. Cherre Huger, BSN

## 2016-10-18 ENCOUNTER — Encounter (HOSPITAL_COMMUNITY)
Admission: RE | Admit: 2016-10-18 | Discharge: 2016-10-18 | Disposition: A | Discharge status: Home / Self Care | Payer: Medicare Other | Source: Ambulatory Visit | Attending: Internal Medicine | Admitting: Internal Medicine

## 2016-10-18 DIAGNOSIS — I5022 Chronic systolic (congestive) heart failure: Secondary | ICD-10-CM | POA: Diagnosis not present

## 2016-10-18 DIAGNOSIS — I110 Hypertensive heart disease with heart failure: Secondary | ICD-10-CM | POA: Diagnosis not present

## 2016-10-18 DIAGNOSIS — I4891 Unspecified atrial fibrillation: Secondary | ICD-10-CM | POA: Diagnosis not present

## 2016-10-18 DIAGNOSIS — I2510 Atherosclerotic heart disease of native coronary artery without angina pectoris: Secondary | ICD-10-CM | POA: Diagnosis not present

## 2016-10-18 DIAGNOSIS — E785 Hyperlipidemia, unspecified: Secondary | ICD-10-CM | POA: Diagnosis not present

## 2016-10-21 ENCOUNTER — Encounter (HOSPITAL_COMMUNITY)
Admission: RE | Admit: 2016-10-21 | Discharge: 2016-10-21 | Disposition: A | Discharge status: Home / Self Care | Payer: Medicare Other | Source: Ambulatory Visit | Attending: Internal Medicine | Admitting: Internal Medicine

## 2016-10-21 DIAGNOSIS — I5022 Chronic systolic (congestive) heart failure: Secondary | ICD-10-CM | POA: Diagnosis not present

## 2016-10-21 DIAGNOSIS — I2510 Atherosclerotic heart disease of native coronary artery without angina pectoris: Secondary | ICD-10-CM | POA: Diagnosis not present

## 2016-10-21 DIAGNOSIS — E785 Hyperlipidemia, unspecified: Secondary | ICD-10-CM | POA: Diagnosis not present

## 2016-10-21 DIAGNOSIS — I4891 Unspecified atrial fibrillation: Secondary | ICD-10-CM | POA: Diagnosis not present

## 2016-10-21 DIAGNOSIS — I110 Hypertensive heart disease with heart failure: Secondary | ICD-10-CM | POA: Diagnosis not present

## 2016-10-23 ENCOUNTER — Encounter (HOSPITAL_COMMUNITY)
Admission: RE | Admit: 2016-10-23 | Discharge: 2016-10-23 | Disposition: A | Discharge status: Home / Self Care | Payer: Medicare Other | Source: Ambulatory Visit | Attending: Internal Medicine | Admitting: Internal Medicine

## 2016-10-23 ENCOUNTER — Ambulatory Visit (INDEPENDENT_AMBULATORY_CARE_PROVIDER_SITE_OTHER): Payer: Medicare Other | Admitting: *Deleted

## 2016-10-23 DIAGNOSIS — E785 Hyperlipidemia, unspecified: Secondary | ICD-10-CM | POA: Diagnosis not present

## 2016-10-23 DIAGNOSIS — I255 Ischemic cardiomyopathy: Secondary | ICD-10-CM | POA: Diagnosis not present

## 2016-10-23 DIAGNOSIS — I5022 Chronic systolic (congestive) heart failure: Secondary | ICD-10-CM

## 2016-10-23 DIAGNOSIS — I4891 Unspecified atrial fibrillation: Secondary | ICD-10-CM | POA: Diagnosis not present

## 2016-10-23 DIAGNOSIS — I2510 Atherosclerotic heart disease of native coronary artery without angina pectoris: Secondary | ICD-10-CM | POA: Diagnosis not present

## 2016-10-23 DIAGNOSIS — I110 Hypertensive heart disease with heart failure: Secondary | ICD-10-CM | POA: Diagnosis not present

## 2016-10-23 NOTE — Progress Notes (Signed)
Remote ICD transmission.   

## 2016-10-24 ENCOUNTER — Encounter: Payer: Self-pay | Admitting: Cardiology

## 2016-10-25 ENCOUNTER — Encounter (HOSPITAL_COMMUNITY)
Admission: RE | Admit: 2016-10-25 | Discharge: 2016-10-25 | Disposition: A | Discharge status: Home / Self Care | Payer: Medicare Other | Source: Ambulatory Visit | Attending: Internal Medicine | Admitting: Internal Medicine

## 2016-10-25 DIAGNOSIS — I5022 Chronic systolic (congestive) heart failure: Secondary | ICD-10-CM | POA: Diagnosis not present

## 2016-10-25 DIAGNOSIS — E785 Hyperlipidemia, unspecified: Secondary | ICD-10-CM | POA: Diagnosis not present

## 2016-10-25 DIAGNOSIS — I2510 Atherosclerotic heart disease of native coronary artery without angina pectoris: Secondary | ICD-10-CM | POA: Diagnosis not present

## 2016-10-25 DIAGNOSIS — I4891 Unspecified atrial fibrillation: Secondary | ICD-10-CM | POA: Diagnosis not present

## 2016-10-25 DIAGNOSIS — I110 Hypertensive heart disease with heart failure: Secondary | ICD-10-CM | POA: Diagnosis not present

## 2016-10-28 ENCOUNTER — Encounter (HOSPITAL_COMMUNITY)
Admission: RE | Admit: 2016-10-28 | Discharge: 2016-10-28 | Disposition: A | Discharge status: Home / Self Care | Payer: Medicare Other | Source: Ambulatory Visit | Attending: Internal Medicine | Admitting: Internal Medicine

## 2016-10-28 DIAGNOSIS — I2510 Atherosclerotic heart disease of native coronary artery without angina pectoris: Secondary | ICD-10-CM | POA: Diagnosis not present

## 2016-10-28 DIAGNOSIS — I110 Hypertensive heart disease with heart failure: Secondary | ICD-10-CM | POA: Diagnosis not present

## 2016-10-28 DIAGNOSIS — I5022 Chronic systolic (congestive) heart failure: Secondary | ICD-10-CM | POA: Diagnosis not present

## 2016-10-28 DIAGNOSIS — I4891 Unspecified atrial fibrillation: Secondary | ICD-10-CM | POA: Diagnosis not present

## 2016-10-28 DIAGNOSIS — E785 Hyperlipidemia, unspecified: Secondary | ICD-10-CM | POA: Diagnosis not present

## 2016-10-30 ENCOUNTER — Encounter (HOSPITAL_COMMUNITY): Payer: Medicare Other

## 2016-11-01 ENCOUNTER — Encounter (HOSPITAL_COMMUNITY): Payer: Medicare Other

## 2016-11-04 ENCOUNTER — Encounter (HOSPITAL_COMMUNITY)
Admission: RE | Admit: 2016-11-04 | Discharge: 2016-11-04 | Disposition: A | Discharge status: Home / Self Care | Payer: Medicare Other | Source: Ambulatory Visit | Attending: Internal Medicine | Admitting: Internal Medicine

## 2016-11-04 DIAGNOSIS — I4891 Unspecified atrial fibrillation: Secondary | ICD-10-CM | POA: Diagnosis not present

## 2016-11-04 DIAGNOSIS — I5022 Chronic systolic (congestive) heart failure: Secondary | ICD-10-CM | POA: Diagnosis not present

## 2016-11-04 DIAGNOSIS — I2510 Atherosclerotic heart disease of native coronary artery without angina pectoris: Secondary | ICD-10-CM | POA: Diagnosis not present

## 2016-11-04 DIAGNOSIS — I110 Hypertensive heart disease with heart failure: Secondary | ICD-10-CM | POA: Diagnosis not present

## 2016-11-04 DIAGNOSIS — E785 Hyperlipidemia, unspecified: Secondary | ICD-10-CM | POA: Diagnosis not present

## 2016-11-06 ENCOUNTER — Encounter (HOSPITAL_COMMUNITY)
Admission: RE | Admit: 2016-11-06 | Discharge: 2016-11-06 | Disposition: A | Discharge status: Home / Self Care | Payer: Medicare Other | Source: Ambulatory Visit | Attending: Internal Medicine | Admitting: Internal Medicine

## 2016-11-06 DIAGNOSIS — E785 Hyperlipidemia, unspecified: Secondary | ICD-10-CM | POA: Diagnosis not present

## 2016-11-06 DIAGNOSIS — I5022 Chronic systolic (congestive) heart failure: Secondary | ICD-10-CM

## 2016-11-06 DIAGNOSIS — I4891 Unspecified atrial fibrillation: Secondary | ICD-10-CM | POA: Diagnosis not present

## 2016-11-06 DIAGNOSIS — I2510 Atherosclerotic heart disease of native coronary artery without angina pectoris: Secondary | ICD-10-CM | POA: Diagnosis not present

## 2016-11-06 DIAGNOSIS — I110 Hypertensive heart disease with heart failure: Secondary | ICD-10-CM | POA: Diagnosis not present

## 2016-11-07 LAB — CUP PACEART REMOTE DEVICE CHECK
Battery Remaining Longevity: 34 mo
Battery Voltage: 2.95 V
Brady Statistic AP VP Percent: 95 %
Brady Statistic AP VS Percent: 1 %
Brady Statistic AS VP Percent: 3.6 %
Brady Statistic RA Percent Paced: 96 %
Date Time Interrogation Session: 20180110090131
HighPow Impedance: 44 Ohm
HighPow Impedance: 44 Ohm
Implantable Lead Implant Date: 20060509
Implantable Lead Location: 753858
Implantable Lead Location: 753859
Lead Channel Impedance Value: 380 Ohm
Lead Channel Pacing Threshold Amplitude: 0.75 V
Lead Channel Pacing Threshold Amplitude: 1.25 V
Lead Channel Pacing Threshold Pulse Width: 0.8 ms
Lead Channel Pacing Threshold Pulse Width: 0.8 ms
Lead Channel Sensing Intrinsic Amplitude: 3.6 mV
Lead Channel Setting Pacing Amplitude: 2.5 V
Lead Channel Setting Pacing Pulse Width: 0.8 ms
Lead Channel Setting Sensing Sensitivity: 0.5 mV
MDC IDC LEAD IMPLANT DT: 20060509
MDC IDC LEAD IMPLANT DT: 20090417
MDC IDC LEAD LOCATION: 753860
MDC IDC MSMT BATTERY REMAINING PERCENTAGE: 50 %
MDC IDC MSMT LEADCHNL LV IMPEDANCE VALUE: 440 Ohm
MDC IDC MSMT LEADCHNL RA PACING THRESHOLD AMPLITUDE: 1 V
MDC IDC MSMT LEADCHNL RA PACING THRESHOLD PULSEWIDTH: 0.5 ms
MDC IDC MSMT LEADCHNL RV IMPEDANCE VALUE: 330 Ohm
MDC IDC MSMT LEADCHNL RV SENSING INTR AMPL: 12 mV
MDC IDC PG IMPLANT DT: 20141208
MDC IDC SET LEADCHNL LV PACING AMPLITUDE: 2 V
MDC IDC SET LEADCHNL LV PACING PULSEWIDTH: 0.8 ms
MDC IDC SET LEADCHNL RA PACING AMPLITUDE: 2 V
MDC IDC STAT BRADY AS VS PERCENT: 1 %
Pulse Gen Serial Number: 7138995

## 2016-11-08 ENCOUNTER — Encounter (HOSPITAL_COMMUNITY)
Admission: RE | Admit: 2016-11-08 | Discharge: 2016-11-08 | Disposition: A | Discharge status: Home / Self Care | Payer: Medicare Other | Source: Ambulatory Visit | Attending: Internal Medicine | Admitting: Internal Medicine

## 2016-11-08 DIAGNOSIS — I5022 Chronic systolic (congestive) heart failure: Secondary | ICD-10-CM

## 2016-11-08 DIAGNOSIS — I4891 Unspecified atrial fibrillation: Secondary | ICD-10-CM | POA: Diagnosis not present

## 2016-11-08 DIAGNOSIS — I2510 Atherosclerotic heart disease of native coronary artery without angina pectoris: Secondary | ICD-10-CM | POA: Diagnosis not present

## 2016-11-08 DIAGNOSIS — E785 Hyperlipidemia, unspecified: Secondary | ICD-10-CM | POA: Diagnosis not present

## 2016-11-08 DIAGNOSIS — I110 Hypertensive heart disease with heart failure: Secondary | ICD-10-CM | POA: Diagnosis not present

## 2016-11-11 ENCOUNTER — Encounter (HOSPITAL_COMMUNITY)
Admission: RE | Admit: 2016-11-11 | Discharge: 2016-11-11 | Disposition: A | Discharge status: Home / Self Care | Payer: Medicare Other | Source: Ambulatory Visit | Attending: Internal Medicine | Admitting: Internal Medicine

## 2016-11-11 DIAGNOSIS — I110 Hypertensive heart disease with heart failure: Secondary | ICD-10-CM | POA: Diagnosis not present

## 2016-11-11 DIAGNOSIS — E785 Hyperlipidemia, unspecified: Secondary | ICD-10-CM | POA: Diagnosis not present

## 2016-11-11 DIAGNOSIS — I2510 Atherosclerotic heart disease of native coronary artery without angina pectoris: Secondary | ICD-10-CM | POA: Diagnosis not present

## 2016-11-11 DIAGNOSIS — I4891 Unspecified atrial fibrillation: Secondary | ICD-10-CM | POA: Diagnosis not present

## 2016-11-11 DIAGNOSIS — I5022 Chronic systolic (congestive) heart failure: Secondary | ICD-10-CM | POA: Diagnosis not present

## 2016-11-13 ENCOUNTER — Encounter (HOSPITAL_COMMUNITY)
Admission: RE | Admit: 2016-11-13 | Discharge: 2016-11-13 | Disposition: A | Discharge status: Home / Self Care | Payer: Medicare Other | Source: Ambulatory Visit | Attending: Internal Medicine | Admitting: Internal Medicine

## 2016-11-13 DIAGNOSIS — I5022 Chronic systolic (congestive) heart failure: Secondary | ICD-10-CM | POA: Diagnosis not present

## 2016-11-13 DIAGNOSIS — I110 Hypertensive heart disease with heart failure: Secondary | ICD-10-CM | POA: Diagnosis not present

## 2016-11-13 DIAGNOSIS — I4891 Unspecified atrial fibrillation: Secondary | ICD-10-CM | POA: Diagnosis not present

## 2016-11-13 DIAGNOSIS — E785 Hyperlipidemia, unspecified: Secondary | ICD-10-CM | POA: Diagnosis not present

## 2016-11-13 DIAGNOSIS — I2510 Atherosclerotic heart disease of native coronary artery without angina pectoris: Secondary | ICD-10-CM | POA: Diagnosis not present

## 2016-11-14 NOTE — Progress Notes (Signed)
Cardiac Individual Treatment Plan  Patient Details  Name: [name] MRN: [id number] Date of Birth: Sep 30, 1932 Referring Provider:   Flowsheet Row CARDIAC REHAB PHASE II ORIENTATION from 08/01/2016 in [place]  Referring Provider  Glori Bickers MD      Initial Encounter Date:  [place] PHASE II ORIENTATION from 08/01/2016 in [place]  Date  08/01/16  Referring Provider  Glori Bickers MD      Visit Diagnosis: 6/73/41, Systolic CHF, chronic ([place])  Patient's Home Medications on Admission:  Current Outpatient Prescriptions:  .  acetaminophen (TYLENOL) 325 MG tablet, Take 2 tablets (650 mg total) by mouth every 6 (six) hours as needed for mild pain (or Fever >/= 101)., Disp: , Rfl:  .  amiodarone (PACERONE) 100 MG tablet, Take 1 tablet (100 mg total) by mouth daily., Disp: , Rfl:  .  azithromycin (ZITHROMAX) 250 MG tablet, Take 1 tablet (250 mg total) by mouth daily., Disp: 1 tablet, Rfl: 0 .  benzonatate (TESSALON) 200 MG capsule, Take 200 mg by mouth 3 (three) times daily as needed for cough., Disp: , Rfl:  .  carvedilol (COREG) 6.25 MG tablet, TAKE ONE TABLET BY MOUTH TWICE DAILY WITH MEALS, Disp: 60 tablet, Rfl: 3 .  diphenhydramine-acetaminophen (TYLENOL PM) 25-500 MG TABS tablet, Take 1 tablet by mouth at bedtime as needed (sleep)., Disp: , Rfl:  .  ELIQUIS 2.5 MG TABS tablet, TAKE ONE TABLET BY MOUTH TWICE DAILY, Disp: 60 tablet, Rfl: 3 .  ezetimibe (ZETIA) 10 MG tablet, TAKE ONE TABLET BY MOUTH EVERY DAY WITH  SUPPER, Disp: 90 tablet, Rfl: 3 .  ferrous sulfate ([employer] IRON) 325 (65 FE) MG tablet, Take 325 mg by mouth every Monday, Wednesday, and Friday. , Disp: , Rfl:  .  furosemide (LASIX) 20 MG tablet, Take 20 mg by mouth daily. , Disp: , Rfl:  .  hydrOXYzine (ATARAX/VISTARIL) 25 MG tablet, Take 1 tablet by mouth at bedtime. Reported on 10/19/2015, Disp: , Rfl:  .  KLOR-CON M10 10  MEQ tablet, Take 10 mEq by mouth daily. , Disp: , Rfl:  .  levothyroxine (SYNTHROID, LEVOTHROID) 50 MCG tablet, Take 25-50 mcg by mouth daily before breakfast. Take 50 mcg (1 tablet) on Tues, Thurs, Sat, Sun and 25 mcg (1/2 tablet) on Mon, Wed, Fri, Disp: , Rfl:  .  Magnesium Hydroxide (MILK OF MAGNESIA PO), Take 20 mLs by mouth daily as needed (for constipation)., Disp: , Rfl:  .  Multiple Vitamins-Minerals (CENTRUM SILVER PO), Take 1 tablet by mouth daily. , Disp: , Rfl:  .  NITROSTAT 0.4 MG SL tablet, PLACE ONE TABLET UNDER THE TONGUE EVERY 5 MINUTES AS NEEDED FOR CHEST PAIN, Disp: 25 tablet, Rfl: 0 .  pantoprazole (PROTONIX) 40 MG tablet, Take 40 mg by mouth daily. , Disp: , Rfl:  .  predniSONE (DELTASONE) 20 MG tablet, Take 2 tablets (40 mg total) by mouth daily with breakfast., Disp: 2 tablet, Rfl: 0 .  Pumpkin Seed-Soy Germ (AZO BLADDER CONTROL/GO-LESS) CAPS, Take 1 capsule by mouth as needed (for bladder incontinence). , Disp: , Rfl:  .  rosuvastatin (CRESTOR) 20 MG tablet, Take 1 tablet (20 mg total) by mouth daily., Disp: 90 tablet, Rfl: 3 .  sodium chloride (OCEAN) 0.65 % SOLN nasal spray, Place 1 spray into both nostrils as needed for congestion., Disp: , Rfl:  .  tamsulosin (FLOMAX) 0.4 MG CAPS capsule, Take 1 capsule by mouth at bedtime.,  Disp: , Rfl:  .  valsartan (DIOVAN) 80 MG tablet, TAKE ONE-HALF TABLET BY MOUTH ONCE DAILY IF SYSTOLIC BLOOD PRESSURE IS GREATER THAN 100, Disp: 30 tablet, Rfl: 3  Past Medical History: Past Medical History:  Diagnosis Date  . Acute bronchitis with COPD ([place]) 09/10/2016  . Anemia   . Atrial fibrillation or flutter    maintaining sinus on amiodarone    . Bladder cancer ([place]) dx'd 05/2012  . CAD (coronary artery disease)     a. s/p anterior MI 12/05 c/b shock -> stent LAD   b. s/p stenting OM-1, 2/06  . CHF (congestive heart failure) ([facility])    due to ischemic CM  a. EF 20-30%. (Nov 2008)   b. s/p [employer] BiV-ICD    c. CPX 07/2008  pvo2 16.3 (63%  predicted) slope 34 RER 1.08 O2 pulse 93%  . Cough    occasional /productive, no fever/ not new  . CRI (chronic renal insufficiency)    (baseline 2.0-2.2)/ recent hospitalization 8/13  . GERD (gastroesophageal reflux disease)    hx Barretts esophagitis  . History of blood transfusion    following coumadin  usage  . HTN (hypertension)    EKG 05/14/12,Chest x ray 8/13, last ICD interrogation 8/13 EPIC  . Hyperlipidemia   . Hypothyroidism   . 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 Skin cancer    basal cells   facial x 4, 1 right forearm  . Sleep apnea    STOP BANG SCORE 4    Tobacco Use: History  Smoking Status  . Former Smoker  . Types: Cigarettes, Cigars  . Quit date: 03/02/2004  Smokeless Tobacco  . Former Systems developer  . Quit date: 05/20/1999    Comment: quit in 2005    Labs: Recent Review Flowsheet Data    Labs for ITP Cardiac and Pulmonary Rehab Latest Ref Rng & Units 11/13/2006 10/06/2007 11/02/2007 04/25/2008 08/15/2013   Cholestrol 0 - 200 mg/dL 111 - 121 90 -   LDLCALC 0 - 99 mg/dL 66 - 73 47 -   HDL >39.0 mg/dL 30.3(L) - 30.7(L) 29.3(L) -   Trlycerides 0 - 149 mg/dL 75 - 85 67 -   HCO3 - - 26.1(H) - - -   TCO2 0 - 100 mmol/L - 27 - - 22      Capillary Blood Glucose: No results found for: GLUCAP   Exercise Target Goals:    Exercise Program Goal: Individual exercise prescription set with THRR, safety & activity barriers. Participant demonstrates ability to understand and report RPE using BORG scale, to self-measure pulse accurately, and to acknowledge the importance of the exercise prescription.  Exercise Prescription Goal: Starting with aerobic activity 30 plus minutes a day, 3 days per week for initial exercise prescription. Provide home exercise prescription and guidelines that participant acknowledges understanding prior to  discharge.  Activity Barriers & Risk Stratification:     Activity Barriers & Cardiac Risk Stratification - 08/01/16 1406      Activity Barriers & Cardiac Risk Stratification   Cardiac Risk Stratification High      6 Minute Walk:     6 Minute Walk    Row Name 08/01/16 1355         6 Minute Walk   Phase Initial     Distance 1363 feet     Walk Time 6 minutes     #  of Rest Breaks 0     MPH 2.58     METS 2.3     RPE 9     VO2 Peak 8.22     Symptoms No     Resting HR 65 bpm     Resting BP 119/75     Max Ex. HR 95 bpm     Max Ex. BP 118/60     2 Minute Post BP 116/68        Initial Exercise Prescription:     Initial Exercise Prescription - 08/01/16 1400      Date of Initial Exercise RX and Referring Provider   Date 08/01/16   Referring Provider Bensimhon, Daniel MD     Recumbant Bike   Level 2   Minutes 10   METs 2     NuStep   Level 3   Minutes 10   METs 2     Cybex   Level 2   Minutes 10   METs 2     Track   Laps 9   Minutes 10   METs 2.39     Prescription Details   Frequency (times per week) 3   Duration Progress to 30 minutes of continuous aerobic without signs/symptoms of physical distress     Intensity   THRR 40-80% of Max Heartrate 53-110   Ratings of Perceived Exertion 11-13     Resistance Training   Training Prescription Yes   Weight 2lbs   Reps 10-12      Perform Capillary Blood Glucose checks as needed.  Exercise Prescription Changes:     Exercise Prescription Changes    Row Name 08/07/16 1700 08/20/16 1000 09/02/16 1204 10/17/16 1400 11/12/16 1400     Exercise Review   Progression Yes Yes  - Yes Yes     Response to Exercise   Blood Pressure (Admit) 110/64 106/62 128/68 100/60 106/64   Blood Pressure (Exercise) 122/66 118/60 120/60 118/60 110/78   Blood Pressure (Exit) 102/60 100/68 1_0   Heart Rate (Admit) 88 bpm 63 bpm 64 bpm 79 bpm 75 bpm   Heart Rate (Exercise) 96 bpm 97 bpm 95 bpm 96 bpm 91 bpm    Heart Rate (Exit) 76 bpm 60 bpm 40 bpm 66 bpm 67 bpm   Rating of Perceived Exertion (Exercise) _1 Symptoms _2    Comments  -  - reviewed HEP on 08/23/16 reviewed HEP on 08/14/16 reviewed HEP on 08/14/16   Duration Progress to 30 minutes of continuous aerobic without signs/symptoms of physical distress Progress to 30 minutes of continuous aerobic without signs/symptoms of physical distress Progress to 30 minutes of continuous aerobic without signs/symptoms of physical distress Progress to 30 minutes of continuous aerobic without signs/symptoms of physical distress Progress to 30 minutes of continuous aerobic without signs/symptoms of physical distress   Intensity _3      Progression   Progression Continue to progress workloads to maintain intensity without signs/symptoms of physical distress. Continue to progress workloads to maintain intensity without signs/symptoms of physical distress. Continue progressive overload as per policy without signs/symptoms or physical distress. Continue progressive overload as per policy without signs/symptoms or physical distress. Continue progressive overload as per policy without signs/symptoms or physical distress.   Average METs 2.2 2.4  - 2.6 2.9     Resistance Training   Training Prescription Yes Yes  - Yes Yes  Weight 2lbs 5lbs  - 3lbs  reduced weights due to a layoff of 7 weeks 5lbs  reduced weights due to a layoff of 7 weeks   Reps 10-12 10-12  - 10-12 10-12     Interval Training   Interval Training No No  - No No     Recumbant Bike   Level 2 2  - 3  upright scifit 3  upright scifit   Minutes 10 10  - 10 10   METs - 2.8  - 2.8 4.1     NuStep   Level 3 3  - 3 3   Minutes 10 10  - 10 10   METs 2.2 2.1  - 2.4 2.6     Cybex   Level -  -  -  -  -   Minutes -  -  -  -  -   METs -  -  -  -  -     Track   Laps 7 10  - 10 7   Minutes 10  10  - 10 10   METs 2.23 2.74  - 2.74 2.23     Home Exercise Plan   Plans to continue exercise at  -  -  - Home  Reviewed on 08/14/16 see progress note Home  Reviewed on 08/14/16 see progress note   Frequency  -  -  - Add 2 additional days to program exercise sessions. Add 2 additional days to program exercise sessions.      Exercise Comments:     Exercise Comments    Row Name 08/07/16 1704 08/21/16 1303 09/17/16 1203 10/17/16 1443     Exercise Comments Participant off to a good start with exercise. Reviewed METs and goals. Pt is tolerating exercise well; will continue to monitor exercise progression. Pt last session at [facility] was on 09/02/16. Pt was admitted to ED for acute bronchitis with COPD and acute respiratory failure with hypoxia Pt returned to [facility] on 10/17/16. Pt responded well to Ex Rx. Reviewed goals. Will monitor exercise progression.       Discharge Exercise Prescription (Final Exercise Prescription Changes):     Exercise Prescription Changes - 11/12/16 1400      Exercise Review   Progression Yes     Response to Exercise   Blood Pressure (Admit) 106/64   Blood Pressure (Exercise) 110/78   Blood Pressure (Exit) 92/56   Heart Rate (Admit) 75 bpm   Heart Rate (Exercise) 91 bpm   Heart Rate (Exit) 67 bpm   Rating of Perceived Exertion (Exercise) 11   Symptoms none   Comments reviewed HEP on 08/14/16   Duration Progress to 30 minutes of continuous aerobic without signs/symptoms of physical distress   Intensity THRR unchanged     Progression   Progression Continue progressive overload as per policy without signs/symptoms or physical distress.   Average METs 2.9     Resistance Training   Training Prescription Yes   Weight 5lbs  reduced weights due to a layoff of 7 weeks   Reps 10-12     Interval Training   Interval Training No     Recumbant Bike   Level 3  upright scifit   Minutes 10   METs 4.1     NuStep   Level 3   Minutes 10   METs 2.6     Track    Laps 7   Minutes 10   METs 2.23     Home Exercise  Plan   Plans to continue exercise at Home  Reviewed on 08/14/16 see progress note   Frequency Add 2 additional days to program exercise sessions.      Nutrition:  Target Goals: Understanding of nutrition guidelines, daily intake of sodium <1565m, cholesterol <2074m calories 30% from fat and 7% or less from saturated fats, daily to have 5 or more servings of fruits and vegetables.  Biometrics:     Pre Biometrics - 08/01/16 1358      Pre Biometrics   Height _0  (1.778 m)   Weight 182 lb 5.1 oz (82.7 kg)   Waist Circumference 42 inches   Hip Circumference 42 inches   Waist to Hip Ratio 1 %   BMI (Calculated) 26.2   Triceps Skinfold 23 mm   % Body Fat 30 %   Grip Strength 34 kg   Flexibility 7.5 in   Single Leg Stand 0 seconds       Nutrition Therapy Plan and Nutrition Goals:     Nutrition Therapy & Goals - 08/05/16 1036      Nutrition Therapy   Diet Therapeutic Lifestyle Changes     Personal Nutrition Goals   Personal Goal #1 1-2 lb wt loss/week to a wt loss goal of 6-15 lb [facility] and counsel regarding individualized specific dietary modifications aiming towards targeted core components such as weight, hypertension, lipid management, diabetes, heart failure and other comorbidities.   Expected Outcomes Short Term Goal: Understand basic principles of dietary content, such as calories, fat, sodium, cholesterol and nutrients.;Long Term Goal: Adherence to prescribed nutrition plan.      Nutrition Discharge: Nutrition Scores:     Nutrition Assessments - 08/05/16 1036      MEDFICTS Scores   Pre Score 106      Nutrition Goals Re-Evaluation:   Psychosocial: Target Goals: Acknowledge presence or absence of depression, maximize coping skills, provide positive support system. Participant is able to verbalize types and ability to  use techniques and skills needed for reducing stress and depression.  Initial Review & Psychosocial Screening:     Initial Psych Review & Screening - 08/01/16 1618      Family Dynamics   Good Support System? Yes  Pt wife accompanied pt to orientation   Comments No psychosocial needs identified, will continue to monitor and re evaluate as needed.     Barriers   Psychosocial barriers to participate in program There are no identifiable barriers or psychosocial needs.      Quality of Life Scores:     Quality of Life - 08/01/16 1400      Quality of Life Scores   Health/Function Pre 22.89 %   Socioeconomic Pre 22 %   Psych/Spiritual Pre 27.43 %   Family Pre 28.8 %   GLOBAL Pre 25.08 %      PHQ-9: Recent Review Flowsheet Data    [facility] 08/01[facility]2016   Decreased Interest 0   Down, Depressed, Hopeless 0   PHQ - 2 Score 0      Psychosocial Evaluation and Intervention:     Psychosocial Evaluation - 11/14/16 1516      Psychosocial Evaluation & Interventions   Interventions Encouraged to exercise with the program and follow exercise prescription   Comments Pt has supportive wife who accompanies him to rehab.   Continued Psychosocial Services Needed No      Psychosocial Re-Evaluation:  Psychosocial Re-Evaluation    Row Name 08/22/16 1436 09/19/16 1432 10/17/16 1635 11/14/16 1517       Psychosocial Re-Evaluation   Interventions Encouraged to attend Cardiac Rehabilitation for the exercise Encouraged to attend Cardiac Rehabilitation for the exercise Encouraged to attend Cardiac Rehabilitation for the exercise Encouraged to attend Cardiac Rehabilitation for the exercise;Relaxation education;Stress management education    Comments  -  - Pt returned to rehab on 10/16/16.  Pt appears to be doing well and is pleased he was able to come off the oxygen therapy Pt doing weel with exercise, enjoys attending, interacts with staff     Continued Psychosocial Services  Needed  - No  -  -       Vocational Rehabilitation: Provide vocational rehab assistance to qualifying candidates.   Vocational Rehab Evaluation & Intervention:     Vocational Rehab - 08/01/16 1620      Initial Vocational Rehab Evaluation & Intervention   Assessment shows need for Vocational Rehabilitation No  Pt does not plan to return to work.  Pt is retired.      Education: Education Goals: Education classes will be provided on a weekly basis, covering required topics. Participant will state understanding/return demonstration of topics presented.  Learning Barriers/Preferences:     Learning Barriers/Preferences - 08/01/16 0813      Learning Barriers/Preferences   Learning Barriers Sight   Learning Preferences Video      Education Topics: Count Your Pulse:  -Group instruction provided by verbal instruction, demonstration, patient participation and written materials to support subject.  Instructors address importance of being able to find your pulse and how to count your pulse when at home without a heart monitor.  Patients get hands on experience counting their pulse with staff help and individually. Flowsheet Row CARDIAC REHAB PHASE II EXERCISE from 11/13/2016 in [place]  Date  10/18/16  Instruction Review Code  2- meets goals/outcomes      Heart Attack, Angina, and Risk Factor Modification:  -Group instruction provided by verbal instruction, video, and written materials to support subject.  Instructors address signs and symptoms of angina and heart attacks.    Also discuss risk factors for heart disease and how to make changes to improve heart health risk factors. Flowsheet Row CARDIAC REHAB PHASE II EXERCISE from 11/13/2016 in [place]  Date  11/06/16  Instruction Review Code  2- meets goals/outcomes      Functional Fitness:  -Group instruction provided by verbal instruction, demonstration,  patient participation, and written materials to support subject.  Instructors address safety measures for doing things around the house.  Discuss how to get up and down off the floor, how to pick things up properly, how to safely get out of a chair without assistance, and balance training.   Meditation and Mindfulness:  -Group instruction provided by verbal instruction, patient participation, and written materials to support subject.  Instructor addresses importance of mindfulness and meditation practice to help reduce stress and improve awareness.  Instructor also leads participants through a meditation exercise.  Flowsheet Row CARDIAC REHAB PHASE II EXERCISE from 11/13/2016 in [place]  Date  08/07/16  Instruction Review Code  2- meets goals/outcomes      Stretching for Flexibility and Mobility:  -Group instruction provided by verbal instruction, patient participation, and written materials to support subject.  Instructors lead participants through series of stretches that are designed to increase flexibility thus improving mobility.  These stretches are additional exercise for major muscle groups that are typically performed during regular warm up and cool down.   Hands Only CPR Anytime:  -Group instruction provided by verbal instruction, video, patient participation and written materials to support subject.  Instructors co-teach with AHA video for hands only CPR.  Participants get hands on experience with mannequins.   Nutrition I class: Heart Healthy Eating:  -Group instruction provided by PowerPoint slides, verbal discussion, and written materials to support subject matter. The instructor gives an explanation and review of the Therapeutic Lifestyle Changes diet recommendations, which includes a discussion on lipid goals, dietary fat, sodium, fiber, plant stanol/sterol esters, sugar, and the components of a well-balanced, healthy diet.   Nutrition II  class: Lifestyle Skills:  -Group instruction provided by PowerPoint slides, verbal discussion, and written materials to support subject matter. The instructor gives an explanation and review of label reading, grocery shopping for heart health, heart healthy recipe modifications, and ways to make healthier choices when eating out.   Diabetes Question & Answer:  -Group instruction provided by PowerPoint slides, verbal discussion, and written materials to support subject matter. The instructor gives an explanation and review of diabetes co-morbidities, pre- and post-prandial blood glucose goals, pre-exercise blood glucose goals, signs, symptoms, and treatment of hypoglycemia and hyperglycemia, and foot care basics. Flowsheet Row CARDIAC REHAB PHASE II EXERCISE from 11/13/2016 in [place]  Date  10/25/16  Educator  RD  Instruction Review Code  2- meets goals/outcomes      Diabetes Blitz:  -Group instruction provided by PowerPoint slides, verbal discussion, and written materials to support subject matter. The instructor gives an explanation and review of the physiology behind type 1 and type 2 diabetes, diabetes medications and rational behind using different medications, pre- and post-prandial blood glucose recommendations and Hemoglobin A1c goals, diabetes diet, and exercise including blood glucose guidelines for exercising safely.    Portion Distortion:  -Group instruction provided by PowerPoint slides, verbal discussion, written materials, and food models to support subject matter. The instructor gives an explanation of serving size versus portion size, changes in portions sizes over the last 20 years, and what consists of a serving from each food group. Flowsheet Row CARDIAC REHAB PHASE II EXERCISE from 11/13/2016 in [place]  Date  10/16/16  Educator  RD  Instruction Review Code  2- meets goals/outcomes      Stress  Management:  -Group instruction provided by verbal instruction, video, and written materials to support subject matter.  Instructors review role of stress in heart disease and how to cope with stress positively.   Flowsheet Row CARDIAC REHAB PHASE II EXERCISE from 11/13/2016 in [place]  Date  08/28/16  Instruction Review Code  2- meets goals/outcomes      Exercising on Your Own:  -Group instruction provided by verbal instruction, power point, and written materials to support subject.  Instructors discuss benefits of exercise, components of exercise, frequency and intensity of exercise, and end points for exercise.  Also discuss use of nitroglycerin and activating EMS.  Review options of places to exercise outside of rehab.  Review guidelines for sex with heart disease. Flowsheet Row CARDIAC REHAB PHASE II EXERCISE from 11/13/2016 in [place]  Date  11/13/16  Educator  [employer]  Instruction Review Code  2- meets goals/outcomes      Cardiac Drugs I:  -Group instruction provided by verbal instruction  and written materials to support subject.  Instructor reviews cardiac drug classes: antiplatelets, anticoagulants, beta blockers, and statins.  Instructor discusses reasons, side effects, and lifestyle considerations for each drug class.   Cardiac Drugs II:  -Group instruction provided by verbal instruction and written materials to support subject.  Instructor reviews cardiac drug classes: angiotensin converting enzyme inhibitors (ACE-I), angiotensin II receptor blockers (ARBs), nitrates, and calcium channel blockers.  Instructor discusses reasons, side effects, and lifestyle considerations for each drug class. Flowsheet Row CARDIAC REHAB PHASE II EXERCISE from 11/13/2016 in [place]  Date  10/23/16  Educator  Pharmacist  Instruction Review Code  2- meets goals/outcomes      Anatomy and  Physiology of the Circulatory System:  -Group instruction provided by verbal instruction, video, and written materials to support subject.  Reviews functional anatomy of heart, how it relates to various diagnoses, and what role the heart plays in the overall system. Flowsheet Row CARDIAC REHAB PHASE II EXERCISE from 11/13/2016 in [place]  Date  08/14/16  Instruction Review Code  2- meets goals/outcomes      Knowledge Questionnaire Score:     Knowledge Questionnaire Score - 08/01/16 1353      Knowledge Questionnaire Score   Pre Score 18/24      Core Components/Risk Factors/Patient Goals at Admission:     Personal Goals and Risk Factors at Admission - 08/01/16 8756      Core Components/Risk Factors/Patient Goals on Admission   Sedentary Yes   Intervention Provide advice, education, support and counseling about physical activity/exercise needs.;Develop an individualized exercise prescription for aerobic and resistive training based on initial evaluation findings, risk stratification, comorbidities and participant's personal goals.   Expected Outcomes Achievement of increased cardiorespiratory fitness and enhanced flexibility, muscular endurance and strength shown through measurements of functional capacity and personal statement of participant.   Increase Strength and Stamina Yes   Intervention Provide advice, education, support and counseling about physical activity/exercise needs.;Develop an individualized exercise prescription for aerobic and resistive training based on initial evaluation findings, risk stratification, comorbidities and participant's personal goals.   Expected Outcomes Achievement of increased cardiorespiratory fitness and enhanced flexibility, muscular endurance and strength shown through measurements of functional capacity and personal statement of participant.   Improve shortness of breath with ADL's Yes   Intervention Provide  education, individualized exercise plan and daily activity instruction to help decrease symptoms of SOB with activities of daily living.   Expected Outcomes Short Term: Achieves a reduction of symptoms when performing activities of daily living.   Heart Failure Yes   Intervention Provide a combined exercise and nutrition program that is supplemented with education, support and counseling about heart failure. Directed toward relieving symptoms such as shortness of breath, decreased exercise tolerance, and extremity edema.   Expected Outcomes Improve functional capacity of life   Personal Goal Other Yes   Personal Goal Increase leg strength, and breathing while walking and doing yard work.    Intervention Individualized exercise program   Expected Outcomes increase functional capacity      Core Components/Risk Factors/Patient Goals Review:      Goals and Risk Factor Review    Row Name 08/21/16 1304 10/17/16 1446 11/12/16 1456         Core Components/Risk Factors/Patient Goals Review   Personal Goals Review Other;Increase Strength and Stamina  - Improve shortness of breath with ADL's     Review Pt states" getting stronger and feeling better"  Breathing and SOB symptoms are improving. Pt is exercising at home and find it to be beneficial Pt states that breathing capacity is improving. Pt uses IS 5-10x, 2x/day. Pt reached 716m without difficulty. 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  Pt stated it has general soreness throughout; however feels stronger. Pt uses IS 2x/day has noticed an improvement in breathing capacity.     Expected Outcomes Pt will continue with HEP and be able to exercise without difficulty or complications Pt will continue to improve in breathing capacity and respond well to exercise program at cardiac rehab. Pt will continue to improve in breathing capacity and respond well to exercise program at cardiac rehab.        Core  Components/Risk Factors/Patient Goals at Discharge (Final Review):      Goals and Risk Factor Review - 11/12/16 1456      Core Components/Risk Factors/Patient Goals Review   Personal Goals Review Improve shortness of breath with ADL's   Review Pt stated it has general soreness throughout; however feels stronger. Pt uses IS 2x/day has noticed an improvement in breathing capacity.   Expected Outcomes Pt will continue to improve in breathing capacity and respond well to exercise program at cardiac rehab.      ITP Comments:     ITP Comments    Row Name 08/01/16 1410 10/11/16 1126         ITP Comments Dr. TFransico Him Medical Director Attended CPR education class; goals and outcomes met.         Comments:  Pt is making expected progress toward personal goals after completing 19 sessions. Psychosocial Assessment  Pt with good support at home. Wife accompanies him to rehab and is involved in his recovery. Pt interacts positively with healthy coping skills. Generally feels good about his outlook.. Recommend continued exercise and life style modification education including  stress management and relaxation techniques to decrease cardiac risk profile. CCherre Huger BSN

## 2016-11-15 ENCOUNTER — Encounter (HOSPITAL_COMMUNITY)
Admission: RE | Admit: 2016-11-15 | Discharge: 2016-11-15 | Disposition: A | Discharge status: Home / Self Care | Payer: Medicare Other | Source: Ambulatory Visit | Attending: Internal Medicine | Admitting: Internal Medicine

## 2016-11-15 DIAGNOSIS — I110 Hypertensive heart disease with heart failure: Secondary | ICD-10-CM | POA: Insufficient documentation

## 2016-11-15 DIAGNOSIS — I5022 Chronic systolic (congestive) heart failure: Secondary | ICD-10-CM | POA: Insufficient documentation

## 2016-11-15 DIAGNOSIS — E785 Hyperlipidemia, unspecified: Secondary | ICD-10-CM | POA: Insufficient documentation

## 2016-11-15 DIAGNOSIS — I4891 Unspecified atrial fibrillation: Secondary | ICD-10-CM | POA: Diagnosis not present

## 2016-11-15 DIAGNOSIS — I2510 Atherosclerotic heart disease of native coronary artery without angina pectoris: Secondary | ICD-10-CM | POA: Insufficient documentation

## 2016-11-18 ENCOUNTER — Encounter (HOSPITAL_COMMUNITY)
Admission: RE | Admit: 2016-11-18 | Discharge: 2016-11-18 | Disposition: A | Discharge status: Home / Self Care | Payer: Medicare Other | Source: Ambulatory Visit | Attending: Internal Medicine | Admitting: Internal Medicine

## 2016-11-18 DIAGNOSIS — E785 Hyperlipidemia, unspecified: Secondary | ICD-10-CM | POA: Diagnosis not present

## 2016-11-18 DIAGNOSIS — I2510 Atherosclerotic heart disease of native coronary artery without angina pectoris: Secondary | ICD-10-CM | POA: Diagnosis not present

## 2016-11-18 DIAGNOSIS — I5022 Chronic systolic (congestive) heart failure: Secondary | ICD-10-CM

## 2016-11-18 DIAGNOSIS — I110 Hypertensive heart disease with heart failure: Secondary | ICD-10-CM | POA: Diagnosis not present

## 2016-11-18 DIAGNOSIS — I4891 Unspecified atrial fibrillation: Secondary | ICD-10-CM | POA: Diagnosis not present

## 2016-11-20 ENCOUNTER — Encounter (HOSPITAL_COMMUNITY)
Admission: RE | Admit: 2016-11-20 | Discharge: 2016-11-20 | Disposition: A | Discharge status: Home / Self Care | Payer: Medicare Other | Source: Ambulatory Visit | Attending: Internal Medicine | Admitting: Internal Medicine

## 2016-11-20 DIAGNOSIS — E785 Hyperlipidemia, unspecified: Secondary | ICD-10-CM | POA: Diagnosis not present

## 2016-11-20 DIAGNOSIS — I4891 Unspecified atrial fibrillation: Secondary | ICD-10-CM | POA: Diagnosis not present

## 2016-11-20 DIAGNOSIS — I5022 Chronic systolic (congestive) heart failure: Secondary | ICD-10-CM

## 2016-11-20 DIAGNOSIS — I2510 Atherosclerotic heart disease of native coronary artery without angina pectoris: Secondary | ICD-10-CM | POA: Diagnosis not present

## 2016-11-20 DIAGNOSIS — I110 Hypertensive heart disease with heart failure: Secondary | ICD-10-CM | POA: Diagnosis not present

## 2016-11-22 ENCOUNTER — Encounter (HOSPITAL_COMMUNITY)
Admission: RE | Admit: 2016-11-22 | Discharge: 2016-11-22 | Disposition: A | Discharge status: Home / Self Care | Payer: Medicare Other | Source: Ambulatory Visit | Attending: Internal Medicine | Admitting: Internal Medicine

## 2016-11-22 DIAGNOSIS — I2510 Atherosclerotic heart disease of native coronary artery without angina pectoris: Secondary | ICD-10-CM | POA: Diagnosis not present

## 2016-11-22 DIAGNOSIS — E785 Hyperlipidemia, unspecified: Secondary | ICD-10-CM | POA: Diagnosis not present

## 2016-11-22 DIAGNOSIS — I4891 Unspecified atrial fibrillation: Secondary | ICD-10-CM | POA: Diagnosis not present

## 2016-11-22 DIAGNOSIS — I110 Hypertensive heart disease with heart failure: Secondary | ICD-10-CM | POA: Diagnosis not present

## 2016-11-22 DIAGNOSIS — I5022 Chronic systolic (congestive) heart failure: Secondary | ICD-10-CM

## 2016-11-25 ENCOUNTER — Encounter (HOSPITAL_COMMUNITY)
Admission: RE | Admit: 2016-11-25 | Discharge: 2016-11-25 | Disposition: A | Discharge status: Home / Self Care | Payer: Medicare Other | Source: Ambulatory Visit | Attending: Internal Medicine | Admitting: Internal Medicine

## 2016-11-25 DIAGNOSIS — E785 Hyperlipidemia, unspecified: Secondary | ICD-10-CM | POA: Diagnosis not present

## 2016-11-25 DIAGNOSIS — I5022 Chronic systolic (congestive) heart failure: Secondary | ICD-10-CM

## 2016-11-25 DIAGNOSIS — I2510 Atherosclerotic heart disease of native coronary artery without angina pectoris: Secondary | ICD-10-CM | POA: Diagnosis not present

## 2016-11-25 DIAGNOSIS — I110 Hypertensive heart disease with heart failure: Secondary | ICD-10-CM | POA: Diagnosis not present

## 2016-11-25 DIAGNOSIS — I4891 Unspecified atrial fibrillation: Secondary | ICD-10-CM | POA: Diagnosis not present

## 2016-11-27 ENCOUNTER — Encounter (HOSPITAL_COMMUNITY)
Admission: RE | Admit: 2016-11-27 | Discharge: 2016-11-27 | Disposition: A | Discharge status: Home / Self Care | Payer: Medicare Other | Source: Ambulatory Visit | Attending: Internal Medicine | Admitting: Internal Medicine

## 2016-11-27 DIAGNOSIS — E785 Hyperlipidemia, unspecified: Secondary | ICD-10-CM | POA: Diagnosis not present

## 2016-11-27 DIAGNOSIS — I2510 Atherosclerotic heart disease of native coronary artery without angina pectoris: Secondary | ICD-10-CM | POA: Diagnosis not present

## 2016-11-27 DIAGNOSIS — I5022 Chronic systolic (congestive) heart failure: Secondary | ICD-10-CM

## 2016-11-27 DIAGNOSIS — I4891 Unspecified atrial fibrillation: Secondary | ICD-10-CM | POA: Diagnosis not present

## 2016-11-27 DIAGNOSIS — I110 Hypertensive heart disease with heart failure: Secondary | ICD-10-CM | POA: Diagnosis not present

## 2016-11-29 ENCOUNTER — Encounter (HOSPITAL_COMMUNITY)
Admission: RE | Admit: 2016-11-29 | Discharge: 2016-11-29 | Disposition: A | Discharge status: Home / Self Care | Payer: Medicare Other | Source: Ambulatory Visit | Attending: Internal Medicine | Admitting: Internal Medicine

## 2016-11-29 VITALS — Ht 70.0 in | Wt 187.6 lb

## 2016-11-29 DIAGNOSIS — I4891 Unspecified atrial fibrillation: Secondary | ICD-10-CM | POA: Diagnosis not present

## 2016-11-29 DIAGNOSIS — I5022 Chronic systolic (congestive) heart failure: Secondary | ICD-10-CM | POA: Diagnosis not present

## 2016-11-29 DIAGNOSIS — I2510 Atherosclerotic heart disease of native coronary artery without angina pectoris: Secondary | ICD-10-CM | POA: Diagnosis not present

## 2016-11-29 DIAGNOSIS — E785 Hyperlipidemia, unspecified: Secondary | ICD-10-CM | POA: Diagnosis not present

## 2016-11-29 DIAGNOSIS — I110 Hypertensive heart disease with heart failure: Secondary | ICD-10-CM | POA: Diagnosis not present

## 2016-11-29 NOTE — Progress Notes (Signed)
Discharge Summary  Patient Details  Name: [name] MRN: [id number] Date of Birth: 04/22/1932 Referring Provider:   Flowsheet Row CARDIAC REHAB PHASE II ORIENTATION from 08/01/2016 in [facility]  Referring Provider  Bensimhon, Daniel MD       Number of Visits: 29 in 18 weeks  Reason for Discharge:  Patient reached a stable level of exercise. Patient independent in their exercise.  Smoking History:  History  Smoking Status  . Former Smoker  . Types: Cigarettes, Cigars  . Quit date: 03/02/2004  Smokeless Tobacco  . Former Systems developer  . Quit date: 05/20/1999    Comment: quit in 2005    Diagnosis:  0/08/67, Systolic CHF, chronic ([facility])  ADL UCSD:   Initial Exercise Prescription:     Initial Exercise Prescription - 08/01/16 1400      Date of Initial Exercise RX and Referring Provider   Date 08/01/16   Referring Provider Bensimhon, Daniel MD     Recumbant Bike   Level 2   Minutes 10   METs 2     NuStep   Level 3   Minutes 10   METs 2     Cybex   Level 2   Minutes 10   METs 2     Track   Laps 9   Minutes 10   METs 2.39     Prescription Details   Frequency (times per week) 3   Duration Progress to 30 minutes of continuous aerobic without signs/symptoms of physical distress     Intensity   THRR 40-80% of Max Heartrate 53-110   Ratings of Perceived Exertion 11-13     Resistance Training   Training Prescription Yes   Weight 2lbs   Reps 10-12      Discharge Exercise Prescription (Final Exercise Prescription Changes):     Exercise Prescription Changes - 11/12/16 1400      Exercise Review   Progression Yes     Response to Exercise   Blood Pressure (Admit) 106/64   Blood Pressure (Exercise) 110/78   Blood Pressure (Exit) 92/56   Heart Rate (Admit) 75 bpm   Heart Rate (Exercise) 91 bpm   Heart Rate (Exit) 67 bpm   Rating of Perceived Exertion (Exercise) 11   Symptoms none   Comments reviewed HEP on 08/14/16    Duration Progress to 30 minutes of continuous aerobic without signs/symptoms of physical distress   Intensity THRR unchanged     Progression   Progression Continue progressive overload as per policy without signs/symptoms or physical distress.   Average METs 2.9     Resistance Training   Training Prescription Yes   Weight 5lbs  reduced weights due to a layoff of 7 weeks   Reps 10-12     Interval Training   Interval Training No     Recumbant Bike   Level 3  upright scifit   Minutes 10   METs 4.1     NuStep   Level 3   Minutes 10   METs 2.6     Track   Laps 7   Minutes 10   METs 2.23     Home Exercise Plan   Plans to continue exercise at Home  Reviewed on 08/14/16 see progress note   Frequency Add 2 additional days to program exercise sessions.      Functional Capacity:     6 Minute Walk    Row Name 08/01/16 1355  6 Minute Walk   Phase Initial     Distance 1363 feet     Walk Time 6 minutes     # of Rest Breaks 0     MPH 2.58     METS 2.3     RPE 9     VO2 Peak 8.22     Symptoms No     Resting HR 65 bpm     Resting BP 119/75     Max Ex. HR 95 bpm     Max Ex. BP 118/60     2 Minute Post BP 116/68        Psychological, QOL, Others - Outcomes: PHQ 2/9: Depression screen [facility] 2/9 11/29/2016 08/01/2016  Decreased Interest 0 0  Down, Depressed, Hopeless 0 0  PHQ - 2 Score 0 0  Some recent data might be hidden    Quality of Life:     Quality of Life - 08/01/16 1400      Quality of Life Scores   Health/Function Pre 22.89 %   Socioeconomic Pre 22 %   Psych/Spiritual Pre 27.43 %   Family Pre 28.8 %   GLOBAL Pre 25.08 %      Personal Goals: Goals established at orientation with interventions provided to work toward goal.     Personal Goals and Risk Factors at Admission - 08/01/16 0822      Core Components/Risk Factors/Patient Goals on Admission   Sedentary Yes   Intervention Provide advice, education, support and counseling  about physical activity/exercise needs.;Develop an individualized exercise prescription for aerobic and resistive training based on initial evaluation findings, risk stratification, comorbidities and participant's personal goals.   Expected Outcomes Achievement of increased cardiorespiratory fitness and enhanced flexibility, muscular endurance and strength shown through measurements of functional capacity and personal statement of participant.   Increase Strength and Stamina Yes   Intervention Provide advice, education, support and counseling about physical activity/exercise needs.;Develop an individualized exercise prescription for aerobic and resistive training based on initial evaluation findings, risk stratification, comorbidities and participant's personal goals.   Expected Outcomes Achievement of increased cardiorespiratory fitness and enhanced flexibility, muscular endurance and strength shown through measurements of functional capacity and personal statement of participant.   Improve shortness of breath with ADL's Yes   Intervention Provide education, individualized exercise plan and daily activity instruction to help decrease symptoms of SOB with activities of daily living.   Expected Outcomes Short Term: Achieves a reduction of symptoms when performing activities of daily living.   Heart Failure Yes   Intervention Provide a combined exercise and nutrition program that is supplemented with education, support and counseling about heart failure. Directed toward relieving symptoms such as shortness of breath, decreased exercise tolerance, and extremity edema.   Expected Outcomes Improve functional capacity of life   Personal Goal Other Yes   Personal Goal Increase leg strength, and breathing while walking and doing yard work.    Intervention Individualized exercise program   Expected Outcomes increase functional capacity       Personal Goals Discharge:     Goals and Risk Factor Review     Row Name 08/21/16 1304 10/17/16 1446 11/12/16 1456         Core Components/Risk Factors/Patient Goals Review   Personal Goals Review Other;Increase Strength and Stamina  - Improve shortness of breath with ADL's     Review Pt states" getting stronger and feeling better" Breathing and SOB symptoms are improving. Pt is exercising at home and find it  to be beneficial Pt states that breathing capacity is improving. Pt uses IS 5-10x, 2x/day. Pt reached 776m without difficulty. 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  Pt stated it has general soreness throughout; however feels stronger. Pt uses IS 2x/day has noticed an improvement in breathing capacity.     Expected Outcomes Pt will continue with HEP and be able to exercise without difficulty or complications Pt will continue to improve in breathing capacity and respond well to exercise program at cardiac rehab. Pt will continue to improve in breathing capacity and respond well to exercise program at cardiac rehab.        Nutrition & Weight - Outcomes:     Pre Biometrics - 08/01/16 1358      Pre Biometrics   Height '5\' 10"'  (1.778 m)   Weight 182 lb 5.1 oz (82.7 kg)   Waist Circumference 42 inches   Hip Circumference 42 inches   Waist to Hip Ratio 1 %   BMI (Calculated) 26.2   Triceps Skinfold 23 mm   % Body Fat 30 %   Grip Strength 34 kg   Flexibility 7.5 in   Single Leg Stand 0 seconds       Nutrition:     Nutrition Therapy & Goals - 08/05/16 1036      Nutrition Therapy   Diet Therapeutic Lifestyle Changes     Personal Nutrition Goals   Personal Goal #1 1-2 lb wt loss/week to a wt loss goal of 6-15 lb at graduation from [facility] educate and counsel regarding individualized specific dietary modifications aiming towards targeted core components such as weight, hypertension, lipid management, diabetes, heart failure and other comorbidities.    Expected Outcomes Short Term Goal: Understand basic principles of dietary content, such as calories, fat, sodium, cholesterol and nutrients.;Long Term Goal: Adherence to prescribed nutrition plan.      Nutrition Discharge:     Nutrition Assessments - 08/05/16 1036      MEDFICTS Scores   Pre Score 106      Education Questionnaire Score:     Knowledge Questionnaire Score - 08/01/16 1353      Knowledge Questionnaire Score   Pre Score 18/24      Goals reviewed with patient Pt graduated from cardiac rehab program today with completion of 29 exercise sessions in Phase II. Pt maintained good attendance and progressed nicely during his participation in rehab as evidenced by increased MET level.   Medication list reconciled. Repeat  PHQ score-0.  Pt has supportive wife who accompanies him to each exercise session.  Pt feels hopeful and has positive outlook on life. Pt post QOL scored are as follows      Quality of Life - 12/18/16 1524      Quality of Life Scores   Health/Function Pre 22.89 %   Health/Function Post 23.14 %   Health/Function % Change 1.09 %   Socioeconomic Pre 22 %   Socioeconomic Post 25 %   Socioeconomic % Change  13.64 %   Psych/Spiritual Pre 27.43 %   Psych/Spiritual Post 26.57 %   Psych/Spiritual % Change -3.14 %   Family Pre 28.8 %   Family Post 25.2 %   Family % Change -12.5 %   GLOBAL Pre 25.08 %   GLOBAL Post 24.56 %   GLOBAL % Change -2.07 %       Pt has  made significant lifestyle changes and should be commended for his success. Pt feels he has achieved his goals during cardiac rehab.  Pt has increased his leg strength, breathing capacity.  Pt feels he is able to do activities around the house with ease and no fatigue or shortness of breath.  Pt plans to continue exercise with equipment he has in his outside barn.  Pt has an elliptical and seated stepper without arms.  Cautioned pt to gradually add the elliptical in his routine starting with a couple of  minutes and gradually adding more.  Pt plans to exercise a couple of times a week. Encouraged pt to try to add another day or two in his exercise routine.  Cardiac maintenance information given to pt wife if pt is unable to consistently exercise on his own.  It is indeed a delight to have this patient participate in cardiac rehab. Cherre Huger, BSN

## 2016-11-29 NOTE — Progress Notes (Signed)
[name] 83 y.o. male Nutrition Note Spoke with pt and pt's wife. Nutrition Plan and Nutrition Survey goals reviewed with pt. Pt is following a low sodium diet but not the Therapeutic Lifestyle Changes diet. Age-appropriate nutrition recommendations discussed. Pt eats out 2-3 times/week. Ways to watch sodium when eating out discussed. Pt expressed understanding of the information reviewed. Pt aware of nutrition education classes offered.  No results found for: HGBA1C Wt Readings from Last 3 Encounters:  09/13/16 179 lb 14.3 oz (81.6 kg)  08/01/16 182 lb 5.1 oz (82.7 kg)  05/27/16 191 lb 8 oz (86.9 kg)   Nutrition Diagnosis ? Food-and nutrition-related knowledge deficit related to lack of exposure to information as related to diagnosis of: ? CVD  Nutrition Intervention ? Pt's individual nutrition plan reviewed with pt. ? Benefits of adopting Therapeutic Lifestyle Changes discussed when Medficts reviewed. ? Pt to attend the Portion Distortion class - met 10/16/16 ? Pt to attend the Diabetes Q & A class - met 10/25/16 ? Pt to attend the   ? Nutrition I class                  ? Nutrition II class ? Continue client-centered nutrition education by RD, as part of interdisciplinary care. Goal(s) ? Pt to identify and limit food sources of saturated fat, trans fat, and sodium ? Pt to identify food quantities necessary to achieve pt's desired weight loss of 6-15 lb at graduation from cardiac rehab.  Monitor and Evaluate progress toward nutrition goal with team. Derek Mound, M.Ed, RD, LDN, CDE 11/29/2016 12:28 PM

## 2016-12-02 ENCOUNTER — Encounter (HOSPITAL_COMMUNITY): Admission: RE | Admit: 2016-12-02 | Payer: Medicare Other | Source: Ambulatory Visit

## 2016-12-04 ENCOUNTER — Encounter (HOSPITAL_COMMUNITY): Payer: Medicare Other

## 2016-12-06 ENCOUNTER — Encounter (HOSPITAL_COMMUNITY): Payer: Medicare Other

## 2016-12-12 ENCOUNTER — Encounter (HOSPITAL_COMMUNITY): Payer: Self-pay | Admitting: Internal Medicine

## 2016-12-12 ENCOUNTER — Ambulatory Visit (HOSPITAL_COMMUNITY)
Admission: RE | Admit: 2016-12-12 | Discharge: 2016-12-12 | Disposition: A | Discharge status: Home / Self Care | Payer: Medicare Other | Source: Ambulatory Visit | Attending: Internal Medicine | Admitting: Internal Medicine

## 2016-12-12 VITALS — BP 118/60 | HR 64 | Wt 189.2 lb

## 2016-12-12 DIAGNOSIS — J440 Chronic obstructive pulmonary disease with acute lower respiratory infection: Secondary | ICD-10-CM | POA: Insufficient documentation

## 2016-12-12 DIAGNOSIS — Z8551 Personal history of malignant neoplasm of bladder: Secondary | ICD-10-CM | POA: Insufficient documentation

## 2016-12-12 DIAGNOSIS — I252 Old myocardial infarction: Secondary | ICD-10-CM | POA: Insufficient documentation

## 2016-12-12 DIAGNOSIS — E785 Hyperlipidemia, unspecified: Secondary | ICD-10-CM | POA: Diagnosis not present

## 2016-12-12 DIAGNOSIS — Z955 Presence of coronary angioplasty implant and graft: Secondary | ICD-10-CM | POA: Diagnosis not present

## 2016-12-12 DIAGNOSIS — I5022 Chronic systolic (congestive) heart failure: Secondary | ICD-10-CM

## 2016-12-12 DIAGNOSIS — I130 Hypertensive heart and chronic kidney disease with heart failure and stage 1 through stage 4 chronic kidney disease, or unspecified chronic kidney disease: Secondary | ICD-10-CM | POA: Diagnosis not present

## 2016-12-12 DIAGNOSIS — K2270 Barrett's esophagus without dysplasia: Secondary | ICD-10-CM | POA: Diagnosis not present

## 2016-12-12 DIAGNOSIS — K219 Gastro-esophageal reflux disease without esophagitis: Secondary | ICD-10-CM | POA: Insufficient documentation

## 2016-12-12 DIAGNOSIS — G250 Essential tremor: Secondary | ICD-10-CM | POA: Diagnosis not present

## 2016-12-12 DIAGNOSIS — E039 Hypothyroidism, unspecified: Secondary | ICD-10-CM | POA: Diagnosis not present

## 2016-12-12 DIAGNOSIS — I480 Paroxysmal atrial fibrillation: Secondary | ICD-10-CM | POA: Diagnosis not present

## 2016-12-12 DIAGNOSIS — Z85828 Personal history of other malignant neoplasm of skin: Secondary | ICD-10-CM | POA: Diagnosis not present

## 2016-12-12 DIAGNOSIS — I2510 Atherosclerotic heart disease of native coronary artery without angina pectoris: Secondary | ICD-10-CM | POA: Insufficient documentation

## 2016-12-12 DIAGNOSIS — N184 Chronic kidney disease, stage 4 (severe): Secondary | ICD-10-CM | POA: Diagnosis not present

## 2016-12-12 NOTE — Patient Instructions (Signed)
We will contact you in 6 months to schedule your next appointment.  

## 2016-12-12 NOTE — Progress Notes (Signed)
Patient ID: [name], male   DOB: April 15, 1932, 82 y.o.   MRN: [id number]    Advanced Heart Failure Clinic Note   PCP: [name]  HPI: [name] is delightful 83 year old male with a history of coronary artery disease, status post previous large anterior wall myocardial infarction s/p multivessel stenting, systolic CHF with EF 51% s/p St. Jude BiV ICD.  Remainder of his medical history is notable for atrial fibrillation,maintaining sinus rhythm on amiodarone.  He is not on Coumadin secondary to GI bleed, chronic renal insufficiency with baseline creatinine about 2.2-2.5, hypertension, hyperlipidemia, and a systemic tremor.Bladder cancer 05/2012. Completed BCG treatment.   2015 found that his left ventricular lead has not been capturing suggestive of either fracture of the proximal electrode or an issue at the header. They reprogrammed the device to use to the RV coil which he tolerated well and had a good threshold.   He was admitted in 11/17 for respiratory virus   He returns today for regular follow up. Says he is doing ok. Still active around the house doing all his activities without problem but hads to take frequent rest breaks.  Denies CP or SOB. Has not had any edema. Taking his lasix 20 mg daily and has not needed any extra. Weight stable within a few lbs at home usually 181-183.  Echo 12/14 LVEF 20-25% Carotid u/s 3/14: 40-59% R 0-39%%L Echo today shows: EF 25%   Labs  12/20/13: K 4.1 Cr 2.7 Hgb 11.9  06/07/14: K 4.5 Cr 2.0 03/2015 K 4.6, Cr 2.7   ROS: All systems negative except as listed in HPI, PMH and Problem List.  Past Medical History:  Diagnosis Date  . Acute bronchitis with COPD ([facility]) 09/10/2016  . Anemia   . Atrial fibrillation or flutter    maintaining sinus on amiodarone    . Bladder cancer ([facility]) dx'd 05/2012  . CAD (coronary artery disease)     a. s/p anterior MI 12/05 c/b shock -> stent LAD   b. s/p stenting OM-1, 2/06  . CHF (congestive heart failure) ([facility])    due to ischemic CM  a. EF 20-30%. (Nov 2008)   b. s/p St. Jude BiV-ICD    c. CPX 07/2008  pvo2 16.3 (63% predicted) slope 34 RER 1.08 O2 pulse 93%  . Cough    occasional /productive, no fever/ not new  . CRI (chronic renal insufficiency)    (baseline 2.0-2.2)/ recent hospitalization 8/13  . GERD (gastroesophageal reflux disease)    hx Barretts esophagitis  . History of blood transfusion    following coumadin  usage  . HTN (hypertension)    EKG 05/14/12,Chest x ray 8/13, last ICD interrogation 8/13 EPIC  . Hyperlipidemia   . Hypothyroidism   . 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 Skin cancer    basal cells   facial x 4, 1 right forearm  . Sleep apnea    STOP BANG SCORE 4    Current Outpatient Prescriptions  Medication Sig Dispense Refill  . amiodarone (PACERONE) 100 MG tablet Take 1 tablet (100 mg total) by mouth daily.    . carvedilol (COREG) 6.25 MG tablet TAKE ONE TABLET BY MOUTH TWICE DAILY WITH MEALS 60 tablet 3  . ELIQUIS 2.5 MG TABS tablet TAKE ONE TABLET BY MOUTH TWICE DAILY 60 tablet 3  . ezetimibe (ZETIA) 10 MG tablet TAKE  ONE TABLET BY MOUTH EVERY DAY WITH  SUPPER 90 tablet 3  . ferrous sulfate ([employer] IRON) 325 (65 FE) MG tablet Take 325 mg by mouth every Monday, Wednesday, and Friday.     . furosemide (LASIX) 20 MG tablet Take 20 mg by mouth daily.     . hydrOXYzine (ATARAX/VISTARIL) 25 MG tablet Take 1 tablet by mouth at bedtime. Reported on 10/19/2015    . KLOR-CON M10 10 MEQ tablet Take 10 mEq by mouth daily.     Marland Kitchen levothyroxine (SYNTHROID, LEVOTHROID) 50 MCG tablet Take 25-50 mcg by mouth daily before breakfast. Take 50 mcg (1 tablet) on Tues, Thurs, Sat, Sun and 25 mcg (1/2 tablet) on Mon, Wed, Fri    . Magnesium Hydroxide (MILK OF MAGNESIA PO) Take 20 mLs by mouth daily as needed (for constipation).    . Multiple Vitamins-Minerals (CENTRUM SILVER  PO) Take 1 tablet by mouth daily.     . pantoprazole (PROTONIX) 40 MG tablet Take 40 mg by mouth daily.     . rosuvastatin (CRESTOR) 20 MG tablet Take 1 tablet (20 mg total) by mouth daily. 90 tablet 3  . tamsulosin (FLOMAX) 0.4 MG CAPS capsule Take 1 capsule by mouth at bedtime.    . valsartan (DIOVAN) 80 MG tablet TAKE ONE-HALF TABLET BY MOUTH ONCE DAILY IF SYSTOLIC BLOOD PRESSURE IS GREATER THAN 100 30 tablet 3  . acetaminophen (TYLENOL) 325 MG tablet Take 2 tablets (650 mg total) by mouth every 6 (six) hours as needed for mild pain (or Fever >/= 101). (Patient not taking: Reported on 12/12/2016)    . diphenhydramine-acetaminophen (TYLENOL PM) 25-500 MG TABS tablet Take 1 tablet by mouth at bedtime as needed (sleep).    Marland Kitchen NITROSTAT 0.4 MG SL tablet PLACE ONE TABLET UNDER THE TONGUE EVERY 5 MINUTES AS NEEDED FOR CHEST PAIN (Patient not taking: Reported on 12/12/2016) 25 tablet 0  . Pumpkin Seed-Soy Germ (AZO BLADDER CONTROL/GO-LESS) CAPS Take 1 capsule by mouth as needed (for bladder incontinence).     . sodium chloride (OCEAN) 0.65 % SOLN nasal spray Place 1 spray into both nostrils as needed for congestion.     No current facility-administered medications for this encounter.      PHYSICAL EXAM: Vitals:   12/12/16 1111  BP: 118/60  Pulse: 64  SpO2: 100%  Weight: 189 lb 4 oz (85.8 kg)   Filed Weights   12/12/16 1111  Weight: 189 lb 4 oz (85.8 kg)   General:  Elderly. NAD HEENT: normal Neck: supple. JVP 7 cm Carotids 2+ bilat; no bruits. Cor: PMI laterally displaced. RRR, No rubs, gallops, 2/6 SEM murmur at apex. ICD site ok Lungs: CTAB, normal effort.  Abdomen: soft, NT, mildly obese, no HSM. No bruits or masses. +BS  Extremities: no cyanosis, clubbing, rash. No edema. +varicose veins.  Neuro: alert & orientedx3, cranial nerves grossly intact. moves all 4 extremities w/o difficulty. affect pleasant +mild tremor   ASSESSMENT & PLAN: 1. Chronic systolic CHF. ECHO stable 1/17 EF  25%    -- Stable with NYHA II-III symptoms. Volume status stable.     -- CD interrogated in Clinic. No VT/AF. Volume status looks good.     -- Continue lasix 20 mg daily.     -- He has not been able to tolerate uptitration of Coreg or valsartan, Continue Coreg 6.25 BID and Valsartan 40 mg daily if BP > 100.  2. CAD     -- No angina. Off ASA due to Eliquis.      --  Continue statin. Lipids followed by Dr. Virgina [name].  3. PAF       --In NSR. Continue amio 100 daily.       --No bleeding on Eliquis 2.5 bid 4. Carotid ultrasound       -- 1-39% bilaterally on last scan 3/15. No repeat warranted. 5. CKD, Stage IV       -- Sees Dr. Joelyn Oms at [facility]. Last check Creatinine stable at 2.8 09/12/16 6. Essential Tremors       -- Followed by PCP. Had a neurologic work up and negative for [employer].     Shawneen Deetz,MD 11:51 AM

## 2016-12-16 DIAGNOSIS — I480 Paroxysmal atrial fibrillation: Secondary | ICD-10-CM | POA: Diagnosis not present

## 2016-12-16 DIAGNOSIS — Z950 Presence of cardiac pacemaker: Secondary | ICD-10-CM | POA: Diagnosis not present

## 2016-12-16 DIAGNOSIS — F3289 Other specified depressive episodes: Secondary | ICD-10-CM | POA: Diagnosis not present

## 2016-12-16 DIAGNOSIS — I7389 Other specified peripheral vascular diseases: Secondary | ICD-10-CM | POA: Diagnosis not present

## 2016-12-16 DIAGNOSIS — N184 Chronic kidney disease, stage 4 (severe): Secondary | ICD-10-CM | POA: Diagnosis not present

## 2016-12-16 DIAGNOSIS — E038 Other specified hypothyroidism: Secondary | ICD-10-CM | POA: Diagnosis not present

## 2016-12-16 DIAGNOSIS — J449 Chronic obstructive pulmonary disease, unspecified: Secondary | ICD-10-CM | POA: Diagnosis not present

## 2016-12-16 DIAGNOSIS — C678 Malignant neoplasm of overlapping sites of bladder: Secondary | ICD-10-CM | POA: Diagnosis not present

## 2016-12-16 DIAGNOSIS — Z6827 Body mass index (BMI) 27.0-27.9, adult: Secondary | ICD-10-CM | POA: Diagnosis not present

## 2016-12-16 DIAGNOSIS — I509 Heart failure, unspecified: Secondary | ICD-10-CM | POA: Diagnosis not present

## 2016-12-16 DIAGNOSIS — R195 Other fecal abnormalities: Secondary | ICD-10-CM | POA: Diagnosis not present

## 2016-12-16 DIAGNOSIS — I130 Hypertensive heart and chronic kidney disease with heart failure and stage 1 through stage 4 chronic kidney disease, or unspecified chronic kidney disease: Secondary | ICD-10-CM | POA: Diagnosis not present

## 2016-12-25 ENCOUNTER — Other Ambulatory Visit: Payer: Self-pay | Admitting: Internal Medicine

## 2016-12-26 ENCOUNTER — Other Ambulatory Visit (HOSPITAL_COMMUNITY): Payer: Self-pay | Admitting: Internal Medicine

## 2016-12-27 ENCOUNTER — Encounter (HOSPITAL_COMMUNITY): Payer: Self-pay | Admitting: *Deleted

## 2017-01-22 ENCOUNTER — Ambulatory Visit (INDEPENDENT_AMBULATORY_CARE_PROVIDER_SITE_OTHER): Payer: Medicare Other | Admitting: *Deleted

## 2017-01-22 DIAGNOSIS — I255 Ischemic cardiomyopathy: Secondary | ICD-10-CM | POA: Diagnosis not present

## 2017-01-22 DIAGNOSIS — I5022 Chronic systolic (congestive) heart failure: Secondary | ICD-10-CM

## 2017-01-22 NOTE — Progress Notes (Signed)
Remote ICD transmission.   

## 2017-01-23 ENCOUNTER — Encounter: Payer: Self-pay | Admitting: Cardiology

## 2017-01-24 LAB — CUP PACEART REMOTE DEVICE CHECK
Brady Statistic AP VS Percent: 1 %
Brady Statistic AS VS Percent: 1 %
Brady Statistic RA Percent Paced: 96 %
Date Time Interrogation Session: 20180411080019
HIGH POWER IMPEDANCE MEASURED VALUE: 48 Ohm
HIGH POWER IMPEDANCE MEASURED VALUE: 48 Ohm
Implantable Lead Implant Date: 20060509
Implantable Lead Implant Date: 20060509
Implantable Lead Location: 753858
Implantable Lead Location: 753860
Implantable Lead Model: 5076
Implantable Lead Model: 7001
Implantable Pulse Generator Implant Date: 20141208
Lead Channel Impedance Value: 380 Ohm
Lead Channel Impedance Value: 400 Ohm
Lead Channel Pacing Threshold Amplitude: 0.75 V
Lead Channel Pacing Threshold Amplitude: 1.25 V
Lead Channel Pacing Threshold Pulse Width: 0.5 ms
Lead Channel Pacing Threshold Pulse Width: 0.8 ms
Lead Channel Pacing Threshold Pulse Width: 0.8 ms
Lead Channel Sensing Intrinsic Amplitude: 12 mV
Lead Channel Setting Sensing Sensitivity: 0.5 mV
MDC IDC LEAD IMPLANT DT: 20090417
MDC IDC LEAD LOCATION: 753859
MDC IDC MSMT BATTERY REMAINING LONGEVITY: 32 mo
MDC IDC MSMT BATTERY REMAINING PERCENTAGE: 47 %
MDC IDC MSMT BATTERY VOLTAGE: 2.95 V
MDC IDC MSMT LEADCHNL RA IMPEDANCE VALUE: 390 Ohm
MDC IDC MSMT LEADCHNL RA PACING THRESHOLD AMPLITUDE: 1 V
MDC IDC MSMT LEADCHNL RA SENSING INTR AMPL: 4.4 mV
MDC IDC PG SERIAL: 7138995
MDC IDC SET LEADCHNL LV PACING AMPLITUDE: 2 V
MDC IDC SET LEADCHNL LV PACING PULSEWIDTH: 0.8 ms
MDC IDC SET LEADCHNL RA PACING AMPLITUDE: 2 V
MDC IDC SET LEADCHNL RV PACING AMPLITUDE: 2.5 V
MDC IDC SET LEADCHNL RV PACING PULSEWIDTH: 0.8 ms
MDC IDC STAT BRADY AP VP PERCENT: 95 %
MDC IDC STAT BRADY AS VP PERCENT: 3.9 %

## 2017-02-17 DIAGNOSIS — E039 Hypothyroidism, unspecified: Secondary | ICD-10-CM | POA: Diagnosis not present

## 2017-02-17 DIAGNOSIS — N184 Chronic kidney disease, stage 4 (severe): Secondary | ICD-10-CM | POA: Diagnosis not present

## 2017-02-24 DIAGNOSIS — I10 Essential (primary) hypertension: Secondary | ICD-10-CM | POA: Diagnosis not present

## 2017-02-24 DIAGNOSIS — N184 Chronic kidney disease, stage 4 (severe): Secondary | ICD-10-CM | POA: Diagnosis not present

## 2017-02-27 ENCOUNTER — Other Ambulatory Visit (HOSPITAL_COMMUNITY): Payer: Self-pay | Admitting: Internal Medicine

## 2017-03-04 DIAGNOSIS — H6123 Impacted cerumen, bilateral: Secondary | ICD-10-CM | POA: Diagnosis not present

## 2017-03-05 ENCOUNTER — Other Ambulatory Visit (HOSPITAL_COMMUNITY): Payer: Self-pay | Admitting: Internal Medicine

## 2017-03-11 ENCOUNTER — Telehealth (HOSPITAL_COMMUNITY): Payer: Self-pay | Admitting: *Deleted

## 2017-03-11 NOTE — Telephone Encounter (Signed)
Patient's wife called in to verify his amiodarone dose.  I clarified for her that patient should be taking 100 mg (1 Tablet) once Daily.  Medication list has been updated to reflect correct dosage.  No further questions at this time.

## 2017-03-18 DIAGNOSIS — L570 Actinic keratosis: Secondary | ICD-10-CM | POA: Diagnosis not present

## 2017-03-18 DIAGNOSIS — L821 Other seborrheic keratosis: Secondary | ICD-10-CM | POA: Diagnosis not present

## 2017-03-18 DIAGNOSIS — Z85828 Personal history of other malignant neoplasm of skin: Secondary | ICD-10-CM | POA: Diagnosis not present

## 2017-04-03 DIAGNOSIS — H25011 Cortical age-related cataract, right eye: Secondary | ICD-10-CM | POA: Diagnosis not present

## 2017-04-03 DIAGNOSIS — Z961 Presence of intraocular lens: Secondary | ICD-10-CM | POA: Diagnosis not present

## 2017-04-03 DIAGNOSIS — H04123 Dry eye syndrome of bilateral lacrimal glands: Secondary | ICD-10-CM | POA: Diagnosis not present

## 2017-04-03 DIAGNOSIS — H1131 Conjunctival hemorrhage, right eye: Secondary | ICD-10-CM | POA: Diagnosis not present

## 2017-04-03 DIAGNOSIS — H2511 Age-related nuclear cataract, right eye: Secondary | ICD-10-CM | POA: Diagnosis not present

## 2017-04-03 DIAGNOSIS — H524 Presbyopia: Secondary | ICD-10-CM | POA: Diagnosis not present

## 2017-04-23 ENCOUNTER — Ambulatory Visit (INDEPENDENT_AMBULATORY_CARE_PROVIDER_SITE_OTHER): Payer: Medicare Other | Admitting: *Deleted

## 2017-04-23 DIAGNOSIS — I5022 Chronic systolic (congestive) heart failure: Secondary | ICD-10-CM

## 2017-04-23 DIAGNOSIS — I255 Ischemic cardiomyopathy: Secondary | ICD-10-CM

## 2017-04-23 LAB — CUP PACEART REMOTE DEVICE CHECK
Brady Statistic AP VS Percent: 1 %
Brady Statistic RA Percent Paced: 96 %
Date Time Interrogation Session: 20180711080753
HIGH POWER IMPEDANCE MEASURED VALUE: 45 Ohm
HighPow Impedance: 45 Ohm
Implantable Lead Implant Date: 20060509
Implantable Lead Implant Date: 20090417
Implantable Lead Location: 753858
Implantable Lead Location: 753859
Implantable Lead Model: 5076
Implantable Lead Model: 7001
Implantable Pulse Generator Implant Date: 20141208
Lead Channel Impedance Value: 410 Ohm
Lead Channel Pacing Threshold Amplitude: 1 V
Lead Channel Pacing Threshold Amplitude: 1.25 V
Lead Channel Pacing Threshold Pulse Width: 0.5 ms
Lead Channel Sensing Intrinsic Amplitude: 4.2 mV
Lead Channel Setting Pacing Amplitude: 2 V
Lead Channel Setting Pacing Pulse Width: 0.8 ms
Lead Channel Setting Pacing Pulse Width: 0.8 ms
MDC IDC LEAD IMPLANT DT: 20060509
MDC IDC LEAD LOCATION: 753860
MDC IDC MSMT BATTERY REMAINING LONGEVITY: 29 mo
MDC IDC MSMT BATTERY REMAINING PERCENTAGE: 43 %
MDC IDC MSMT BATTERY VOLTAGE: 2.93 V
MDC IDC MSMT LEADCHNL LV IMPEDANCE VALUE: 460 Ohm
MDC IDC MSMT LEADCHNL LV PACING THRESHOLD AMPLITUDE: 0.75 V
MDC IDC MSMT LEADCHNL LV PACING THRESHOLD PULSEWIDTH: 0.8 ms
MDC IDC MSMT LEADCHNL RV IMPEDANCE VALUE: 380 Ohm
MDC IDC MSMT LEADCHNL RV PACING THRESHOLD PULSEWIDTH: 0.8 ms
MDC IDC MSMT LEADCHNL RV SENSING INTR AMPL: 12 mV
MDC IDC SET LEADCHNL RA PACING AMPLITUDE: 2 V
MDC IDC SET LEADCHNL RV PACING AMPLITUDE: 2.5 V
MDC IDC SET LEADCHNL RV SENSING SENSITIVITY: 0.5 mV
MDC IDC STAT BRADY AP VP PERCENT: 95 %
MDC IDC STAT BRADY AS VP PERCENT: 4 %
MDC IDC STAT BRADY AS VS PERCENT: 1 %
Pulse Gen Serial Number: 7138995

## 2017-04-23 NOTE — Progress Notes (Signed)
Remote ICD transmission.   

## 2017-04-24 DIAGNOSIS — I130 Hypertensive heart and chronic kidney disease with heart failure and stage 1 through stage 4 chronic kidney disease, or unspecified chronic kidney disease: Secondary | ICD-10-CM | POA: Diagnosis not present

## 2017-04-24 DIAGNOSIS — C679 Malignant neoplasm of bladder, unspecified: Secondary | ICD-10-CM | POA: Diagnosis not present

## 2017-04-24 DIAGNOSIS — R627 Adult failure to thrive: Secondary | ICD-10-CM | POA: Diagnosis not present

## 2017-04-24 DIAGNOSIS — H268 Other specified cataract: Secondary | ICD-10-CM | POA: Diagnosis not present

## 2017-04-24 DIAGNOSIS — J449 Chronic obstructive pulmonary disease, unspecified: Secondary | ICD-10-CM | POA: Diagnosis not present

## 2017-04-24 DIAGNOSIS — N184 Chronic kidney disease, stage 4 (severe): Secondary | ICD-10-CM | POA: Diagnosis not present

## 2017-04-24 DIAGNOSIS — Z6827 Body mass index (BMI) 27.0-27.9, adult: Secondary | ICD-10-CM | POA: Diagnosis not present

## 2017-04-24 DIAGNOSIS — E038 Other specified hypothyroidism: Secondary | ICD-10-CM | POA: Diagnosis not present

## 2017-04-24 DIAGNOSIS — I509 Heart failure, unspecified: Secondary | ICD-10-CM | POA: Diagnosis not present

## 2017-04-24 DIAGNOSIS — F3289 Other specified depressive episodes: Secondary | ICD-10-CM | POA: Diagnosis not present

## 2017-04-24 DIAGNOSIS — Z950 Presence of cardiac pacemaker: Secondary | ICD-10-CM | POA: Diagnosis not present

## 2017-04-24 DIAGNOSIS — R195 Other fecal abnormalities: Secondary | ICD-10-CM | POA: Diagnosis not present

## 2017-04-29 ENCOUNTER — Encounter: Payer: Self-pay | Admitting: Cardiology

## 2017-04-30 DIAGNOSIS — H25011 Cortical age-related cataract, right eye: Secondary | ICD-10-CM | POA: Diagnosis not present

## 2017-04-30 DIAGNOSIS — H2511 Age-related nuclear cataract, right eye: Secondary | ICD-10-CM | POA: Diagnosis not present

## 2017-05-01 ENCOUNTER — Encounter: Payer: Self-pay | Admitting: *Deleted

## 2017-05-05 ENCOUNTER — Other Ambulatory Visit: Payer: Self-pay | Admitting: *Deleted

## 2017-05-05 NOTE — Patient Outreach (Signed)
[facility]) Care Management  05/05/2017  [name] Feb 18, 1932 [id number]   Telephone Screen  Referral Date: 05/01/17 Referral Source: EMMI Referral Referral Reason: Heart Failure, Kidney Failure, Heart Disease Insurance: Medicare, [employer] attempt # 1 spoke with patient spouse [name]). HIPAA verified with patient's wife. Patient requested RN CM [name]) to speak with his wife.  Social:  Patient lives with wife. He is independent with ADLs. Patient or spouse transports patient to medical appointments.  Spouse stated, she would be appreciative of any assistance available.   Conditions: Past Medical Hx: Central Tremors, CHF, PAF, CKD 4, GERD, MI, CAD, Bladder Cancer, Pacemaker/Defibrillator, HTN  Spouse reported, patient had cataract surgery on 04/30/17 and he is recovering. [name] stated, patient is limited to activities related to tremors. Patient has a history of CHF with an EF of 20-25%. Patient has voiced symptoms of fatigue, per EMR. [name] stated, patient voices frustration related to a decline in his quality of life.     Medications: Spouse reported greater taking 10 meds per day. Patient reported having some difficulty with affording medications and taking them as prescribed.  Spouse voiced concerns about Valsartan.   Appointments: Patient's last PCP appointment was 04/24/17. Patient's next appointment is scheduled for 4 months.   Advanced Directives:  Pt reported not having an Advanced Directive. Spouse agreed to receive information about Advanced Directives.    Consent: [facility] services reviewed and discussed with patient. Verbal consent received.  Plan: RN CM will send referral to [facility] RN for further in home eval/assessment of care needs and management of chronic conditions. RN CM advised patient to contact RNCM for any needs or concerns. RN CM provided patient with [facility] 24hr Nurse Line contact info. RN CM will send [place]  referral for polypharmacy  RN CM will send [place] referral for possible assistance with affording medications.   [name] Bells, RN, BSN, MHA/MSL, [place] Telephonic Care Manager Coordinator [facility] Direct Phone: [phone] Toll Free: [phone] Fax: [phone]

## 2017-05-05 NOTE — Telephone Encounter (Signed)
This encounter was created in error - please disregard.

## 2017-05-06 ENCOUNTER — Telehealth (HOSPITAL_COMMUNITY): Payer: Self-pay | Admitting: *Deleted

## 2017-05-06 MED ORDER — LOSARTAN POTASSIUM 50 MG PO TABS: 50.0000 mg | ORAL_TABLET | Freq: Every day | ORAL | 3 refills | Status: DC

## 2017-05-06 NOTE — Telephone Encounter (Signed)
Received notification from pharmacy that valsartan 80 mg hs been recalled.  Verified with Oda Kilts, PA that we will switch patient to losartan 50 mg Once Daily.  I have sent medication to pharmacy and called and left message letting the patient know.

## 2017-05-07 ENCOUNTER — Other Ambulatory Visit: Payer: Self-pay

## 2017-05-07 NOTE — Patient Outreach (Signed)
Telephone assessment: New referral.  Placed call to patient at home, no answer. Placed call to wife's cell phone. No answer.  PLAN: will attempt again.  Tomasa Rand, RN, BSN, CEN [facility] [employer] [phone]

## 2017-05-08 ENCOUNTER — Other Ambulatory Visit: Payer: Self-pay

## 2017-05-08 NOTE — Patient Outreach (Signed)
Telephone assessment:   2nd attempt to reach unsuccessful.    Left message for patient to call me.  PLAN: Will await a call back. If no response will attempt contact again.  Tomasa Rand, RN, BSN, CEN [facility] [employer] [phone]

## 2017-05-08 NOTE — Patient Outreach (Signed)
Care Coordination: Incoming call from wife, returning my call. Reviewed reason for call.  Offered home visit for assessment of needs on 05/12/2017. Patient and wife accepted.  PLAN: Home visit on 05/12/2017 at 1pm.  Confirmed address.  Tomasa Rand, RN, BSN, CEN [facility] [employer] [phone]

## 2017-05-09 ENCOUNTER — Encounter: Payer: Self-pay | Admitting: Physician Assistant

## 2017-05-09 ENCOUNTER — Ambulatory Visit (INDEPENDENT_AMBULATORY_CARE_PROVIDER_SITE_OTHER): Payer: Medicare Other | Admitting: Physician Assistant

## 2017-05-09 VITALS — BP 96/54 | HR 60 | Ht 70.0 in | Wt 190.0 lb

## 2017-05-09 DIAGNOSIS — Z95810 Presence of automatic (implantable) cardiac defibrillator: Secondary | ICD-10-CM

## 2017-05-09 DIAGNOSIS — I2510 Atherosclerotic heart disease of native coronary artery without angina pectoris: Secondary | ICD-10-CM | POA: Diagnosis not present

## 2017-05-09 DIAGNOSIS — I5022 Chronic systolic (congestive) heart failure: Secondary | ICD-10-CM | POA: Diagnosis not present

## 2017-05-09 DIAGNOSIS — I480 Paroxysmal atrial fibrillation: Secondary | ICD-10-CM | POA: Diagnosis not present

## 2017-05-09 DIAGNOSIS — I4891 Unspecified atrial fibrillation: Secondary | ICD-10-CM

## 2017-05-09 DIAGNOSIS — I255 Ischemic cardiomyopathy: Secondary | ICD-10-CM

## 2017-05-09 MED ORDER — LOSARTAN POTASSIUM 50 MG PO TABS: 25.0000 mg | ORAL_TABLET | Freq: Every day | ORAL | 11 refills | Status: DC

## 2017-05-09 MED ORDER — LOSARTAN POTASSIUM 50 MG PO TABS: 25.0000 mg | ORAL_TABLET | Freq: Every day | ORAL | 3 refills | Status: DC

## 2017-05-09 NOTE — Patient Instructions (Addendum)
Medication Instructions:   START TAKING LOSARTAN 25 MG ( HALF A TABLET OF 50 MG TABLET) ONCE A DAY   If you need a refill on your cardiac medications before your next appointment, please call your pharmacy.  Labwork: NONE ORDERED  TODAY    Testing/Procedures: NONE ORDERED  TODAY    Follow-Up: Remote monitoring is used to monitor your Pacemaker of ICD from home. This monitoring reduces the number of office visits required to check your device to one time per year. It allows [place] to keep an eye on the functioning of your device to ensure it is working properly. You are scheduled for a device check from home on . 08-07-17 You may send your transmission at any time that day. If you have a wireless device, the transmission will be sent automatically. After your physician reviews your transmission, you will receive a postcard with your next transmission date.   Your physician wants you to follow-up in: [place] will receive a reminder letter in the mail two months in advance. If you don't receive a letter, please call our office to schedule the follow-up appointment.    Any Other Special Instructions Will Be Listed Below (If Applicable).

## 2017-05-09 NOTE — Progress Notes (Signed)
Cardiology Office Note Date:  05/09/2017  Patient ID:  [name], [name] 06-04-1932, MRN [id number] PCP:  [name], MD  Cardiologist/CHF: Dr. Haroldine Laws Electrophysiologist: Dr. Caryl Comes     Chief Complaint: annual visit  History of Present Illness: [name] is a 83 y.o. male with history of CAD/PCI, chronic CHF (systolic), ICD w/ICD, PAF (GIB on warfarin) on Eliquis, CRI (IV), HTN, HLD, tremor, bladder cancer treated.  He comes in today to be seen for dr. Lovena Le, last seen by him July 2017, at that time doing well without changes.  The patient comes accompanied by his wife.  He tells me he is feeling well, denies any CP, palpitations or SOB, no dizziness, near syncope or syncope.  He denies symptoms of orthopnea or PND, is very active in/outside of his home.  He has some baseline DOE unchanged for years, says his yard as a incline and when he has walked out to the fence and coming back is uphill he will get winded though recovers quickly.  He denies DOE on flat surfaces.  He denies any bleeding or signs of bleeding.  He reports he sees his PMD Q4 months, labs Q 6 months.  Device information: SJM CRT-D, implanted 02/20/07, LV lead 2009, gen change 09/21/13, Dr. Caryl Comes Dr. Caryl Comes noted, device programed to exclude the proximal ring on the LV lead.  AAD tx: amiodarone for AF, was down titrated 2/2 tremors that resolved on lower dose  Past Medical History:  Diagnosis Date  . Acute bronchitis with COPD ([place]) 09/10/2016  . Anemia   . Atrial fibrillation or flutter    maintaining sinus on amiodarone    . Bladder cancer ([place]) dx'd 05/2012  . CAD (coronary artery disease)     a. s/p anterior MI 12/05 c/b shock -> stent LAD   b. s/p stenting OM-1, 2/06  . CHF (congestive heart failure) (HCC)    due to ischemic CM  a. EF 20-30%. (Nov 2008)   b. s/p St. Jude BiV-ICD    c. CPX 07/2008  pvo2 16.3 (63% predicted) slope 34 RER 1.08 O2 pulse 93%  . Cough    occasional /productive, no  fever/ not new  . CRI (chronic renal insufficiency)    (baseline 2.0-2.2)/ recent hospitalization 8/13  . GERD (gastroesophageal reflux disease)    hx Barretts esophagitis  . History of blood transfusion    following coumadin  usage  . HTN (hypertension)    EKG 05/14/12,Chest x ray 8/13, last ICD interrogation 8/13 EPIC  . Hyperlipidemia   . Hypothyroidism   . 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 Skin cancer    basal cells   facial x 4, 1 right forearm  . Sleep apnea    STOP BANG SCORE 4    Past Surgical History:  Procedure Laterality Date  . [place]   lower back  . CARDIAC DEFIBRILLATOR PLACEMENT     ICD St Jude; gen change 09-20-13  . CERVICAL LAMINECTOMY  1985  . COLONOSCOPY    . CORONARY ANGIOPLASTY  2005,2006  . CYSTOSCOPY W/ RETROGRADES  05/25/2012   Procedure: CYSTOSCOPY WITH RETROGRADE PYELOGRAM;  Surgeon: Molli Hazard, MD;  Location: [facility];  Service: Urology;  Laterality: Bilateral;  . CYSTOSCOPY W/ URETERAL STENT PLACEMENT  07/08/2012   Procedure: CYSTOSCOPY WITH STENT REPLACEMENT;  Surgeon: Quillian Quince  Julieanne Cotton, MD;  Location: [facility];  Service: Urology;  Laterality: Left;  . CYSTOSCOPY W/ URETERAL STENT REMOVAL  07/08/2012   Procedure: CYSTOSCOPY WITH STENT REMOVAL;  Surgeon: Molli Hazard, MD;  Location: [facility];  Service: Urology;  Laterality: Bilateral;  . CYSTOSCOPY/RETROGRADE/URETEROSCOPY  07/08/2012   Procedure: CYSTOSCOPY/RETROGRADE/URETEROSCOPY;  Surgeon: Molli Hazard, MD;  Location: [facility];  Service: Urology;  Laterality: Left;  . ESOPHAGOGASTRODUODENOSCOPY    . HERNIA REPAIR  1989   bilateral  . IMPLANTABLE CARDIOVERTER DEFIBRILLATOR (ICD) GENERATOR CHANGE N/A 09/20/2013   Procedure: ICD GENERATOR CHANGE;  Surgeon: Deboraha Sprang, MD;  Location: [facility] CATH LAB;  Service: Cardiovascular;  Laterality: N/A;    . TRANSURETHRAL RESECTION OF BLADDER TUMOR  05/25/2012   cold cup biopsy prostate  . TRANSURETHRAL RESECTION OF BLADDER TUMOR  07/08/2012   Procedure: TRANSURETHRAL RESECTION OF BLADDER TUMOR (TURBT);  Surgeon: Molli Hazard, MD;  Location: [facility];  Service: Urology;  Laterality: N/A;  CYSTO, TURBT W/ GYRUS, BILATERAL STENT REMOVAL, LEFT URETEROSCOPY WITH BIOPSY, POSSIBLE LEFT STENT PLACEMENT     Current Outpatient Prescriptions  Medication Sig Dispense Refill  . acetaminophen (TYLENOL) 325 MG tablet Take 2 tablets (650 mg total) by mouth every 6 (six) hours as needed for mild pain (or Fever >/= 101).    Marland Kitchen amiodarone (PACERONE) 100 MG tablet Take 1 tablet (100 mg total) by mouth daily. 90 tablet 3  . carvedilol (COREG) 6.25 MG tablet TAKE ONE TABLET BY MOUTH TWICE DAILY WITH MEALS 60 tablet 3  . diphenhydramine-acetaminophen (TYLENOL PM) 25-500 MG TABS tablet Take 1 tablet by mouth at bedtime as needed (sleep).    Marland Kitchen ELIQUIS 2.5 MG TABS tablet TAKE ONE TABLET BY MOUTH TWICE DAILY 60 tablet 3  . ezetimibe (ZETIA) 10 MG tablet TAKE ONE TABLET BY MOUTH ONCE DAILY WITH SUPPER 90 tablet 3  . ferrous sulfate ([employer] IRON) 325 (65 FE) MG tablet Take 325 mg by mouth every Monday, Wednesday, and Friday.     . furosemide (LASIX) 20 MG tablet Take 20 mg by mouth daily.     . hydrOXYzine (ATARAX/VISTARIL) 25 MG tablet Take 1 tablet by mouth at bedtime. Reported on 10/19/2015    . KLOR-CON M10 10 MEQ tablet Take 10 mEq by mouth daily.     Marland Kitchen levothyroxine (SYNTHROID, LEVOTHROID) 50 MCG tablet Take 25-50 mcg by mouth daily before breakfast. Take 50 mcg (1 tablet) on Tues, Thurs, Sat, Sun and 25 mcg (1/2 tablet) on Mon, Wed, Fri    . Magnesium Hydroxide (MILK OF MAGNESIA PO) Take 20 mLs by mouth daily as needed (for constipation).    . Multiple Vitamins-Minerals (CENTRUM SILVER PO) Take 1 tablet by mouth daily.     . nitroGLYCERIN (NITROSTAT) 0.4 MG SL tablet DISSOLVE ONE TABLET UNDER THE TONGUE EVERY 5  MINUTES AS NEEDED FOR CHEST PAIN.  DO NOT EXCEED A TOTAL OF 3 DOSES IN 15 MINUTES 25 tablet 0  . pantoprazole (PROTONIX) 40 MG tablet Take 40 mg by mouth daily.     . Pumpkin Seed-Soy Germ (AZO BLADDER CONTROL/GO-LESS) CAPS Take 1 capsule by mouth as needed (for bladder incontinence).     . rosuvastatin (CRESTOR) 20 MG tablet Take 1 tablet (20 mg total) by mouth daily. 90 tablet 3  . sodium chloride (OCEAN) 0.65 % SOLN nasal spray Place 1 spray into both nostrils as needed for congestion.    . tamsulosin (FLOMAX) 0.4 MG CAPS capsule Take 1 capsule by mouth at  bedtime.    Marland Kitchen losartan (COZAAR) 50 MG tablet Take 0.5 tablets (25 mg total) by mouth daily. 30 tablet 11   No current facility-administered medications for this visit.     Allergies:   Ramipril; Amoxicillin; and Penicillins   Social History:  The patient  reports that he quit smoking about 13 years ago. His smoking use included Cigarettes and Cigars. He quit smokeless tobacco use about 17 years ago. He reports that he does not drink alcohol or use drugs.   Family History:  The patient's family history includes Coronary artery disease in his unknown relative; Stroke in his unknown relative.  ROS:  Please see the history of present illness.  All other systems are reviewed and otherwise negative.   PHYSICAL EXAM:  VS:  BP (!) 96/54   Pulse 60   Ht 5\' 10"  (1.778 m)   Wt 190 lb (86.2 kg)   SpO2 97%   BMI 27.26 kg/m  BMI: Body mass index is 27.26 kg/m. Well nourished, well developed, in no acute distress  HEENT: normocephalic, atraumatic  Neck: no JVD, carotid bruits or masses Cardiac: RRR; no significant murmurs, no rubs, or gallops Lungs: CTA b/l  no wheezing, rhonchi or rales  Abd: soft, nontender MS: no deformity or atrophy Ext: no edema  Skin: warm and dry, no rash Neuro:  No gross deficits appreciated Psych: euthymic mood, full affect  ICD site is stable, no tethering or discomfort   EKG:  Done 09/10/16 AV paced ICD  interrogation done today by industry and reviewed by myself: Battery and lead measurements are good, 32 AMS episodes since Nov 2017, longest 1.5hours, no V arrhythmias,  >99% BiVe paced (96%A paced), AS/VS underlying today, CorVue had been slight above threshold, though back down  10/19/15: TTE Study Conclusions - Left ventricle: The cavity size was mildly dilated. Wall   thickness was normal. Systolic function was severely reduced. The   estimated ejection fraction was in the range of 25% to 30%.   Diffuse hypokinesis. There is akinesis of the anteroseptal and   apical myocardium. Doppler parameters are consistent with   abnormal left ventricular relaxation (grade 1 diastolic   dysfunction). - Left atrium: The atrium was mildly dilated. Impressions: - Global hyokinesis with akinesis of the anteroseptal wall and   apex; grade 1 diastolic dysfunction; mild LAE; trace MR and TR.   Recent Labs: 09/10/2016: B Natriuretic Peptide 287.3; Magnesium 2.5 09/11/2016: ALT 19 09/12/2016: BUN 57; Creatinine, Ser 2.85; Hemoglobin 11.8; Platelets 178; Potassium 4.0; Sodium 139  No results found for requested labs within last 8760 hours.   CrCl cannot be calculated (Patient's most recent lab result is older than the maximum 21 days allowed.).   Wt Readings from Last 3 Encounters:  05/09/17 190 lb (86.2 kg)  12/12/16 189 lb 4 oz (85.8 kg)  12/18/16 187 lb 9.8 oz (85.1 kg)     Other studies reviewed: Additional studies/records reviewed today include: summarized above  ASSESSMENT AND PLAN:  1. ICD     Normal function, no changes made  2. Chronic CHF (systolic)     Exam, weight, CorVu do not suggest fluid OL     His med list looks like he was alrady changed to losartan (from valsartan) though his wife states she was unaware of the change.  She tells me that their pharmacy said that they are having all their Valsartan prescriptions be brought in, nothing specific if his particular medication was  involved  He was on a very small dose of Valsartan and only 1/2 tab when BP allowed, will change the losartan to 25mg  1/2 tab with the same guidelines, daily if SBP >100     continue his BB, diuretics unchanged  3. CAD     no anginal complaints     On BB, statin, labs managed with his PMD  4. Paroxysmal AFib     CHA2DS2Vasc is at least 4, on Eliquis (appropriately dosed at 2.5mg  for Creat and age, clearance is 69)     They report Q6 month labs with PMD       Disposition: F/u with ! 3 month remote device checks, Dr. Haroldine Laws as direct by them, and in-clinic EP visit in 1 year, sooner if needed.  Current medicines are reviewed at length with the patient today.  The patient did not have any concerns regarding medicines.  Haywood Lasso, PA-C 05/09/2017 1:03 PM     [address] [place] [address] [phone] (office)  [phone] (fax)

## 2017-05-12 ENCOUNTER — Other Ambulatory Visit: Payer: Self-pay

## 2017-05-12 ENCOUNTER — Ambulatory Visit: Payer: Self-pay

## 2017-05-12 NOTE — Patient Outreach (Signed)
Case closure: Received a voicemail from wife stating that patient wanted to cancel home visit.   Placed call back to patient to reschedule. Patient states that he has changed his mind and is no longer interested in services.  PLAN: Offered to mail letter for future reference and patient has agreed to the letter. Will close case as patient is refusing services.  Will notify MD.   Tomasa Rand, RN, BSN, CEN [place] Coordinator [phone]

## 2017-05-27 ENCOUNTER — Other Ambulatory Visit: Payer: Self-pay | Admitting: Pharmacist

## 2017-05-27 NOTE — Patient Outreach (Signed)
[place] [facility]) Care Management  05/27/2017  [name] Sep 09, 1932 [id number]  Patient was referred to [place] Pharmacist by [facility] RN [place] for medication assistance and polypharmacy.   Successful phone outreach to patient, his spouse answered call and placed call on speaker phone---patient provided verbal consent to speak with his spouse and HIPAA details were verified.   Explained purpose of call---spouse reports they feel patient is doing well and they are not interested in looking at different medications to lower cost of medications.   Noted patient is on Eliquis, spouse confirms, discussed [place] patient assistance program income limits---spouse reports they exceed income limits.   Spouse and patient did not wish to review medications with [facility] Pharmacist.    They denied questions/concerns for [facility] Pharmacist.   Plan:  Will not open pharmacy episode.    They were advised to contact [facility] Pharmacist if new pharmacy related needs arise.   Karrie Meres, PharmD, [place] [phone]

## 2017-06-24 ENCOUNTER — Other Ambulatory Visit (HOSPITAL_COMMUNITY): Payer: Self-pay | Admitting: Internal Medicine

## 2017-06-28 ENCOUNTER — Other Ambulatory Visit (HOSPITAL_COMMUNITY): Payer: Self-pay | Admitting: Internal Medicine

## 2017-07-01 DIAGNOSIS — N184 Chronic kidney disease, stage 4 (severe): Secondary | ICD-10-CM | POA: Diagnosis not present

## 2017-07-01 DIAGNOSIS — C670 Malignant neoplasm of trigone of bladder: Secondary | ICD-10-CM | POA: Diagnosis not present

## 2017-07-01 DIAGNOSIS — N401 Enlarged prostate with lower urinary tract symptoms: Secondary | ICD-10-CM | POA: Diagnosis not present

## 2017-07-01 DIAGNOSIS — R350 Frequency of micturition: Secondary | ICD-10-CM | POA: Diagnosis not present

## 2017-07-23 ENCOUNTER — Ambulatory Visit (INDEPENDENT_AMBULATORY_CARE_PROVIDER_SITE_OTHER): Payer: Medicare Other | Admitting: *Deleted

## 2017-07-23 DIAGNOSIS — I255 Ischemic cardiomyopathy: Secondary | ICD-10-CM

## 2017-07-23 DIAGNOSIS — I5022 Chronic systolic (congestive) heart failure: Secondary | ICD-10-CM

## 2017-07-23 NOTE — Progress Notes (Signed)
Remote ICD transmission.   

## 2017-07-25 ENCOUNTER — Encounter: Payer: Self-pay | Admitting: Cardiology

## 2017-07-28 ENCOUNTER — Encounter (HOSPITAL_COMMUNITY): Payer: Self-pay | Admitting: Emergency Medicine

## 2017-07-28 ENCOUNTER — Emergency Department (HOSPITAL_COMMUNITY)
Admission: EM | Admit: 2017-07-28 | Discharge: 2017-07-28 | Disposition: A | Discharge status: Home / Self Care | Payer: Medicare Other | Attending: Emergency Medicine | Admitting: Emergency Medicine

## 2017-07-28 ENCOUNTER — Emergency Department (HOSPITAL_COMMUNITY): Payer: Medicare Other

## 2017-07-28 DIAGNOSIS — Z95810 Presence of automatic (implantable) cardiac defibrillator: Secondary | ICD-10-CM | POA: Diagnosis not present

## 2017-07-28 DIAGNOSIS — Z79899 Other long term (current) drug therapy: Secondary | ICD-10-CM | POA: Diagnosis not present

## 2017-07-28 DIAGNOSIS — I2510 Atherosclerotic heart disease of native coronary artery without angina pectoris: Secondary | ICD-10-CM | POA: Insufficient documentation

## 2017-07-28 DIAGNOSIS — N184 Chronic kidney disease, stage 4 (severe): Secondary | ICD-10-CM | POA: Diagnosis not present

## 2017-07-28 DIAGNOSIS — I4891 Unspecified atrial fibrillation: Secondary | ICD-10-CM | POA: Diagnosis not present

## 2017-07-28 DIAGNOSIS — I130 Hypertensive heart and chronic kidney disease with heart failure and stage 1 through stage 4 chronic kidney disease, or unspecified chronic kidney disease: Secondary | ICD-10-CM | POA: Insufficient documentation

## 2017-07-28 DIAGNOSIS — M791 Myalgia, unspecified site: Secondary | ICD-10-CM | POA: Insufficient documentation

## 2017-07-28 DIAGNOSIS — R1084 Generalized abdominal pain: Secondary | ICD-10-CM | POA: Diagnosis not present

## 2017-07-28 DIAGNOSIS — E785 Hyperlipidemia, unspecified: Secondary | ICD-10-CM | POA: Insufficient documentation

## 2017-07-28 DIAGNOSIS — Z7901 Long term (current) use of anticoagulants: Secondary | ICD-10-CM | POA: Diagnosis not present

## 2017-07-28 DIAGNOSIS — J449 Chronic obstructive pulmonary disease, unspecified: Secondary | ICD-10-CM | POA: Insufficient documentation

## 2017-07-28 DIAGNOSIS — R109 Unspecified abdominal pain: Secondary | ICD-10-CM | POA: Diagnosis not present

## 2017-07-28 DIAGNOSIS — Z87891 Personal history of nicotine dependence: Secondary | ICD-10-CM | POA: Insufficient documentation

## 2017-07-28 DIAGNOSIS — M7918 Myalgia, other site: Secondary | ICD-10-CM

## 2017-07-28 DIAGNOSIS — E039 Hypothyroidism, unspecified: Secondary | ICD-10-CM | POA: Insufficient documentation

## 2017-07-28 DIAGNOSIS — I5022 Chronic systolic (congestive) heart failure: Secondary | ICD-10-CM | POA: Diagnosis not present

## 2017-07-28 HISTORY — DX: Benign prostatic hyperplasia without lower urinary tract symptoms: N40.0

## 2017-07-28 LAB — BASIC METABOLIC PANEL
ANION GAP: 9 (ref 5–15)
BUN: 38 mg/dL — AB (ref 6–20)
CO2: 23 mmol/L (ref 22–32)
Calcium: 9 mg/dL (ref 8.9–10.3)
Chloride: 107 mmol/L (ref 101–111)
Creatinine, Ser: 2.44 mg/dL — ABNORMAL HIGH (ref 0.61–1.24)
GFR calc Af Amer: 27 mL/min — ABNORMAL LOW (ref >60)
GFR, EST NON AFRICAN AMERICAN: 23 mL/min — AB (ref >60)
GLUCOSE: 110 mg/dL — AB (ref 65–99)
POTASSIUM: 4.4 mmol/L (ref 3.5–5.1)
Sodium: 139 mmol/L (ref 135–145)

## 2017-07-28 LAB — CBC
HEMATOCRIT: 38.2 % — AB (ref 39.0–52.0)
HEMOGLOBIN: 12.4 g/dL — AB (ref 13.0–17.0)
MCH: 30 pg (ref 26.0–34.0)
MCHC: 32.5 g/dL (ref 30.0–36.0)
MCV: 92.3 fL (ref 78.0–100.0)
Platelets: 203 10*3/uL (ref 150–400)
RBC: 4.14 MIL/uL — ABNORMAL LOW (ref 4.22–5.81)
RDW: 13.4 % (ref 11.5–15.5)
WBC: 7.3 10*3/uL (ref 4.0–10.5)

## 2017-07-28 LAB — URINALYSIS, ROUTINE W REFLEX MICROSCOPIC
BILIRUBIN URINE: NEGATIVE
GLUCOSE, UA: NEGATIVE mg/dL
HGB URINE DIPSTICK: NEGATIVE
KETONES UR: NEGATIVE mg/dL
Leukocytes, UA: NEGATIVE
Nitrite: NEGATIVE
PROTEIN: NEGATIVE mg/dL
Specific Gravity, Urine: 1.017 (ref 1.005–1.030)
pH: 6 (ref 5.0–8.0)

## 2017-07-28 MED ORDER — LIDOCAINE 5 % EX PTCH: 1.0000 | MEDICATED_PATCH | CUTANEOUS | 0 refills | Status: DC

## 2017-07-28 MED ORDER — IOPAMIDOL (ISOVUE-300) INJECTION 61%
INTRAVENOUS | Status: AC
  Administered 2017-07-28: 30 mL via ORAL
  Filled 2017-07-28: qty 30

## 2017-07-28 MED ORDER — SODIUM CHLORIDE 0.9 % IV BOLUS (SEPSIS)
500.0000 mL | Freq: Once | INTRAVENOUS | Status: AC
  Administered 2017-07-28: 500 mL via INTRAVENOUS

## 2017-07-28 MED ORDER — IOPAMIDOL (ISOVUE-300) INJECTION 61%: 30.0000 mL | Freq: Once | INTRAVENOUS | Status: DC | PRN

## 2017-07-28 NOTE — ED Notes (Signed)
Bladder Scan: 40ML

## 2017-07-28 NOTE — Care Management Note (Signed)
Case Management Note  Patient Details  Name: [name] MRN: [id number] Date of Birth: 08/19/1932  Subjective/Objective:                  83 y.o. male here for evaluation of left-sided mid back and rib pain.   Action/Plan: CM consulted for HHS.  Spoke with pt and spouse at bedside who state they have used [place] x2 in the past and would like to use them again.  Discussed DME and pt has everything except a wheelchair.  Pt refused a wheelchair unless it was electric.  Advised him that he could work with [facility] and his PCP to get an Clinical research associate.  Pt was still receiving IV fluids at time of discussion.  Advised RN that pt would need to ambulate or at least be able to stand pivot and turn to transfer prior to D/C since spouse reports pt has not walked since yesterday and would need to go home by ambulance. Updated Dr. [name].  CM contacted Santiago Glad with [facility] for HHS.  Expected Discharge Date:   07/28/17               Expected Discharge Plan:  [place]  Discharge planning Services  CM Consult  Post Acute Care Choice:  Home Health Choice offered to:  Patient, Spouse  HH Arranged:  RN, PT, OT, Nurse's Aide, Social Work [employer] Agency:  [employer]  Status of Service:  Completed, signed off  Rae Mar, RN 07/28/2017, 5:24 PM

## 2017-07-28 NOTE — ED Provider Notes (Signed)
[facility] DEPT Provider Note   CSN: [id number] Arrival date & time: 07/28/17  1016     History   Chief Complaint Chief Complaint  Patient presents with  . Flank Pain    HPI [name] is a 83 y.o. male.  HPI Here for evaluation of left-sided mid back and rib pain. Patient reports symptoms have been ongoing over the past one week. Reports pain is sharp in nature, constantly there, but much worse with any kind of movement. Feels much better after he is able to get up and starts moving around. He denies any fevers, chills, chest pain, shortness of breath, dizziness, diaphoresis, nausea or vomiting, abdominal pain. Abdomen does look slightly distended and he reports that he thinks his abdomen may be more distended than usual. No other unusual urinary changes, diarrhea, constipation. Last bowel movement yesterday, nonbloody or overtly dark.He has taken an OTC medications with minimal relief.   Past Medical History:  Diagnosis Date  . Acute bronchitis with COPD ([place]) 09/10/2016  . Anemia   . Atrial fibrillation or flutter    maintaining sinus on amiodarone    . Bladder cancer ([place]) dx'd 05/2012  . BPH (benign prostatic hyperplasia)   . CAD (coronary artery disease)     a. s/p anterior MI 12/05 c/b shock -> stent LAD   b. s/p stenting OM-1, 2/06  . CHF (congestive heart failure) (HCC)    due to ischemic CM  a. EF 20-30%. (Nov 2008)   b. s/p St. Jude BiV-ICD    c. CPX 07/2008  pvo2 16.3 (63% predicted) slope 34 RER 1.08 O2 pulse 93%  . Cough    occasional /productive, no fever/ not new  . CRI (chronic renal insufficiency)    (baseline 2.0-2.2)/ recent hospitalization 8/13  . GERD (gastroesophageal reflux disease)    hx Barretts esophagitis  . History of blood transfusion    following coumadin  usage  . HTN (hypertension)    EKG 05/14/12,Chest x ray 8/13, last ICD interrogation 8/13 EPIC  . Hyperlipidemia   . Hypothyroidism   . 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 Skin cancer    basal cells   facial x 4, 1 right forearm  . Sleep apnea    STOP BANG SCORE 4    Patient Active Problem List   Diagnosis Date Noted  . Acute respiratory failure with hypoxia ([place]) 09/10/2016  . Acute bronchitis with COPD ([place]) 09/10/2016  . CKD (chronic kidney disease) stage 4, GFR 15-29 ml/min (HCC) 04/04/2015  . 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 Urosepsis 08/16/2013  . Bruising 08/12/2012  . Bladder cancer ([place]) 07/08/2012  . Bladder tumor 05/18/2012  . Knee pain, acute, right 02/11/2012  . CAROTID ARTERY DISEASE 12/06/2010  . Essential hypertension, benign 09/22/2009  . Chronic systolic CHF (congestive heart failure) (HCC)-EF 25% 09/14/2009  . CHEST PAIN 08/08/2009  . Mixed hyperlipidemia 05/25/2009  . CAD, NATIVE VESSEL 02/01/2009  . Ischemic cardiomyopathy 01/24/2009  . Atrial fibrillation ([place]) 01/21/2009  . ICD (implantable cardioverter-defibrillator) in place (St Jude pacer/ICD) 01/21/2009    Past Surgical History:  Procedure Laterality Date  . [place]   lower back  . CARDIAC DEFIBRILLATOR PLACEMENT     ICD St Jude; gen change 09-20-13  . CERVICAL LAMINECTOMY  1985  . COLONOSCOPY    .  CORONARY ANGIOPLASTY  2005,2006  . CYSTOSCOPY W/ RETROGRADES  05/25/2012   Procedure: CYSTOSCOPY WITH RETROGRADE PYELOGRAM;  Surgeon: Molli Hazard, MD;  Location: [facility];  Service: Urology;  Laterality: Bilateral;  . CYSTOSCOPY W/ URETERAL STENT PLACEMENT  07/08/2012   Procedure: CYSTOSCOPY WITH STENT REPLACEMENT;  Surgeon: Molli Hazard, MD;  Location: [facility];  Service: Urology;  Laterality: Left;  . CYSTOSCOPY W/ URETERAL STENT REMOVAL  07/08/2012   Procedure: CYSTOSCOPY WITH STENT REMOVAL;  Surgeon: Molli Hazard, MD;  Location: [facility];  Service: Urology;  Laterality:  Bilateral;  . CYSTOSCOPY/RETROGRADE/URETEROSCOPY  07/08/2012   Procedure: CYSTOSCOPY/RETROGRADE/URETEROSCOPY;  Surgeon: Molli Hazard, MD;  Location: [facility];  Service: Urology;  Laterality: Left;  . ESOPHAGOGASTRODUODENOSCOPY    . HERNIA REPAIR  1989   bilateral  . IMPLANTABLE CARDIOVERTER DEFIBRILLATOR (ICD) GENERATOR CHANGE N/A 09/20/2013   Procedure: ICD GENERATOR CHANGE;  Surgeon: Deboraha Sprang, MD;  Location[facility] C[facility] LAB;  Service: Cardiovascular;  Laterality: N/A;  . TRANSURETHRAL RESECTION OF BLADDER TUMOR  05/25/2012   cold cup biopsy prostate  . TRANSURETHRAL RESECTION OF BLADDER TUMOR  07/08/2012   Procedure: TRANSURETHRAL RESECTION OF BLADDER TUMOR (TURBT);  Surgeon: Molli Hazard, MD;  Location: [facility];  Service: Urology;  Laterality: N/A;  CYSTO, TURBT W/ GYRUS, BILATERAL STENT REMOVAL, LEFT URETEROSCOPY WITH BIOPSY, POSSIBLE LEFT STENT PLACEMENT        Home Medications    Prior to Admission medications   Medication Sig Start Date End Date Taking? Authorizing Provider  acetaminophen (TYLENOL) 500 MG tablet Take 1,000 mg by mouth every 6 (six) hours as needed for moderate pain.   Yes [provider]  amiodarone (PACERONE) 100 MG tablet Take 1 tablet (100 mg total) by mouth daily. 03/06/17  Yes Larey Dresser, MD  apixaban (ELIQUIS) 2.5 MG TABS tablet Take 1 tablet (2.5 mg total) by mouth 2 (two) times daily. Needs office visit 06/30/17  Yes Bensimhon, Shaune Pascal, MD  carvedilol (COREG) 6.25 MG tablet Take 1 tablet (6.25 mg total) by mouth 2 (two) times daily with a meal. NEEDS FOLLOW UP 06/2017 06/24/17  Yes Larey Dresser, MD  diphenhydramine-acetaminophen (TYLENOL PM) 25-500 MG TABS tablet Take 1 tablet by mouth at bedtime as needed (sleep).   Yes [provider]  ezetimibe (ZETIA) 10 MG tablet TAKE ONE TABLET BY MOUTH ONCE DAILY WITH SUPPER 03/06/17  Yes Larey Dresser, MD  ferrous sulfate ([facility] IRON) 325 (65 FE) MG tablet Take 325 mg by mouth  every Monday, Wednesday, and Friday.    Yes [provider]  furosemide (LASIX) 20 MG tablet Take 20 mg by mouth daily.  08/21/13  Yes [provider]  hydrOXYzine (ATARAX/VISTARIL) 25 MG tablet Take 1 tablet by mouth at bedtime as needed for itching (legs.). Reported on 10/19/2015 11/16/13  Yes [provider]  KLOR-CON M10 10 MEQ tablet Take 10 mEq by mouth daily.  06/26/13  Yes [provider]  levothyroxine (SYNTHROID, LEVOTHROID) 50 MCG tablet Take 25-50 mcg by mouth daily before breakfast. Take 50 mcg (1 tablet) on Tues, Thurs, Sat, Sun and 25 mcg (1/2 tablet) on Mon, Wed, Fri   Yes [provider]  losartan (COZAAR) 50 MG tablet Take 0.5 tablets (25 mg total) by mouth daily. 05/09/17 08/07/17 Yes Baldwin [place], PA-C  Magnesium Hydroxide (MILK OF MAGNESIA PO) Take 20 mLs by mouth daily as needed (for constipation).   Yes [provider]  Multiple Vitamins-Minerals (CENTRUM  SILVER PO) Take 1 tablet by mouth at bedtime.    Yes [provider]  nitroGLYCERIN (NITROSTAT) 0.4 MG SL tablet DISSOLVE ONE TABLET UNDER THE TONGUE EVERY 5 MINUTES AS NEEDED FOR CHEST PAIN.  DO NOT EXCEED A TOTAL OF 3 DOSES IN 15 MINUTES 12/26/16  Yes Larey Dresser, MD  pantoprazole (PROTONIX) 40 MG tablet Take 40 mg by mouth daily.  01/15/11  Yes [provider]  Pumpkin Seed-Soy Germ (AZO BLADDER CONTROL/GO-LESS) CAPS Take 1 capsule by mouth daily as needed (for bladder incontinence).    Yes [provider]  rosuvastatin (CRESTOR) 20 MG tablet Take 1 tablet (20 mg total) by mouth daily. Patient taking differently: Take 20 mg by mouth at bedtime.  10/18/15  Yes Bensimhon, Shaune Pascal, MD  sodium chloride (OCEAN) 0.65 % SOLN nasal spray Place 1-2 sprays into both nostrils daily as needed for congestion.    Yes [provider]  tamsulosin (FLOMAX) 0.4 MG CAPS capsule Take 0.4 mg by mouth at bedtime.  11/23/13  Yes [provider]    acetaminophen (TYLENOL) 325 MG tablet Take 2 tablets (650 mg total) by mouth every 6 (six) hours as needed for mild pain (or Fever >/= 101). Patient not taking: Reported on 07/28/2017 08/18/13   [name], MD    Family History Family History  Problem Relation Age of Onset  . Coronary artery disease Unknown   . Stroke Unknown     Social History Social History  Substance Use Topics  . Smoking status: Former Smoker    Types: Cigarettes, Cigars    Quit date: 03/02/2004  . Smokeless tobacco: Former Systems developer    Quit date: 05/20/1999     Comment: quit in 2005  . Alcohol use No     Allergies   Ramipril and Penicillins   Review of Systems Review of Systems See history of present illness  Physical Exam Updated Vital Signs BP 134/68   Pulse 68   Temp 98.2 F (36.8 C) (Oral)   Resp 18   SpO2 98%   Physical Exam  Constitutional: He appears well-developed. No distress.  Awake, alert and nontoxic in appearance  HENT:  Head: Normocephalic and atraumatic.  Right Ear: External ear normal.  Left Ear: External ear normal.  Mouth/Throat: Oropharynx is clear and moist.  Eyes: Pupils are equal, round, and reactive to light. Conjunctivae and EOM are normal.  Neck: Normal range of motion. No JVD present.  Cardiovascular: Normal rate, regular rhythm and normal heart sounds.   Pulmonary/Chest: Effort normal and breath sounds normal. No stridor.  Tender to palpate a posterior aspect of left lower ribs diffusely with no focal bony pain, tenting, crepitus. Palpation elicits exact pain response patient has been experiencing. No rash  Abdominal: Soft.  Some mild abdominal distention and midline tenderness. No rebound or guarding. No peritoneal signs.  Musculoskeletal: Normal range of motion.  Neurological:  Awake, alert, cooperative and aware of situation; motor strength bilaterally; sensation normal to light touch bilaterally; no facial asymmetry; tongue midline; major cranial nerves appear  intact;  baseline gait without new ataxia.  Skin: No rash noted. He is not diaphoretic.  Psychiatric: He has a normal mood and affect. His behavior is normal. Thought content normal.  Nursing note and vitals reviewed.    ED Treatments / Results  Labs (all labs ordered are listed, but only abnormal results are displayed) Labs Reviewed  BASIC METABOLIC PANEL - Abnormal; Notable for the following:  Result Value   Glucose, Bld 110 (*)    BUN 38 (*)    Creatinine, Ser 2.44 (*)    GFR calc non Af Amer 23 (*)    GFR calc Af Amer 27 (*)    All other components within normal limits  CBC - Abnormal; Notable for the following:    RBC 4.14 (*)    Hemoglobin 12.4 (*)    HCT 38.2 (*)    All other components within normal limits  URINALYSIS, ROUTINE W REFLEX MICROSCOPIC    EKG  EKG Interpretation None       Radiology Ct Abdomen Pelvis Wo Contrast  Result Date: 07/28/2017 CLINICAL DATA:  Left flank pain for 3 weeks. EXAM: CT ABDOMEN AND PELVIS WITHOUT CONTRAST TECHNIQUE: Multidetector CT imaging of the abdomen and pelvis was performed following the standard protocol without IV contrast. COMPARISON:  CT scan of August 15, 2013. FINDINGS: Lower chest: Visualized lung bases are unremarkable. Moderate size hiatal hernia is noted. Hepatobiliary: No focal liver abnormality is seen. No gallstones, gallbladder wall thickening, or biliary dilatation. Pancreas: Unremarkable. No pancreatic ductal dilatation or surrounding inflammatory changes. Spleen: Normal in size without focal abnormality. Adrenals/Urinary Tract: Adrenal glands are unremarkable. Kidneys are normal, without renal calculi, focal lesion, or hydronephrosis. Bladder cellules are again noted. Stomach/Bowel: Stomach is within normal limits. Appendix appears normal. No evidence of bowel wall thickening, distention, or inflammatory changes. Sigmoid diverticulosis is noted without inflammation. Vascular/Lymphatic: Aortic atherosclerosis.  No enlarged abdominal or pelvic lymph nodes. Reproductive: Prostate is unremarkable. Other: No abdominal wall hernia or abnormality. No abdominopelvic ascites. Musculoskeletal: No acute or significant osseous findings. IMPRESSION: Sigmoid diverticulosis without inflammation. Moderate size sliding-type hiatal hernia. Aortic atherosclerosis. No acute abnormality seen in the abdomen or pelvis. Electronically Signed   By: Marijo Conception, M.D.   On: 07/28/2017 15:32    Procedures Procedures (including critical care time)  Medications Ordered in ED Medications  iopamidol (ISOVUE-300) 61 % injection 30 mL (not administered)  sodium chloride 0.9 % bolus 500 mL (not administered)  iopamidol (ISOVUE-300) 61 % injection (30 mLs Oral Contrast Given 07/28/17 1243)     Initial Impression / Assessment and Plan / ED Course  I have reviewed the triage vital signs and the nursing notes.  Pertinent labs & imaging results that were available during my care of the patient were reviewed by me and considered in my medical decision making (see chart for details).     Patient presents with left-sided back and rib pain that seems consistent with musculoskeletal etiology. However given comorbidities, we'll obtain screening labs, urinalysis,EKG, CT abdomen to ensure no other emergent process. Workup is overall reassuring with no evidence of acute or emergent pathology at this time. Discussed follow-up with PCP. Order for home health aid due to pt mobility concerns of wife. Return precautions discussed as well as signs and symptoms that necessitate prompt ED evaluation. Prior to patient discharge, I discussed and reviewed this case with Dr. Laverta Baltimore      Final Clinical Impressions(s) / ED Diagnoses   Final diagnoses:  Flank pain  Musculoskeletal pain    New Prescriptions New Prescriptions   No medications on file     Comer Locket, Hershal Coria 07/28/17 1649    Long, Wonda [facility], MD 07/29/17 [phone]

## 2017-07-28 NOTE — ED Triage Notes (Signed)
Per EMS-states he has had dull left flank pain for 3 weeks-history of renal disease/failure and BPH-states he is distended-states no new dysuria

## 2017-07-28 NOTE — ED Notes (Signed)
[facility] called for transport back to home

## 2017-07-28 NOTE — ED Notes (Signed)
[facility] en route at this time.

## 2017-07-28 NOTE — Discharge Instructions (Signed)
Your exam was reassuring and your discomfort due to a musculoskeletal cause. Continue taking Tylenol, using warm compresses as we discussed. Your labs, EKG, CT scan of your abdomen were all very reassuring. Please follow-up with your doctor in the next couple of days for reevaluation. Return to ED for new or worsening symptoms as we discussed.

## 2017-07-28 NOTE — ED Notes (Signed)
Bed: WA23 Expected date:  Expected time:  Means of arrival:  Comments: 

## 2017-07-29 DIAGNOSIS — E860 Dehydration: Secondary | ICD-10-CM | POA: Diagnosis not present

## 2017-07-29 DIAGNOSIS — W57XXXA Bitten or stung by nonvenomous insect and other nonvenomous arthropods, initial encounter: Secondary | ICD-10-CM | POA: Diagnosis not present

## 2017-07-29 DIAGNOSIS — R634 Abnormal weight loss: Secondary | ICD-10-CM | POA: Diagnosis not present

## 2017-07-29 DIAGNOSIS — Z6827 Body mass index (BMI) 27.0-27.9, adult: Secondary | ICD-10-CM | POA: Diagnosis not present

## 2017-07-29 DIAGNOSIS — R0781 Pleurodynia: Secondary | ICD-10-CM | POA: Diagnosis not present

## 2017-07-31 DIAGNOSIS — Z23 Encounter for immunization: Secondary | ICD-10-CM | POA: Diagnosis not present

## 2017-08-12 LAB — CUP PACEART REMOTE DEVICE CHECK
Battery Remaining Longevity: 25 mo
Battery Remaining Percentage: 39 %
Brady Statistic AS VS Percent: 1 %
Date Time Interrogation Session: 20181010060427
HIGH POWER IMPEDANCE MEASURED VALUE: 47 Ohm
HIGH POWER IMPEDANCE MEASURED VALUE: 47 Ohm
Implantable Lead Implant Date: 20060509
Implantable Lead Location: 753858
Implantable Lead Location: 753859
Implantable Pulse Generator Implant Date: 20141208
Lead Channel Impedance Value: 380 Ohm
Lead Channel Pacing Threshold Amplitude: 1 V
Lead Channel Pacing Threshold Amplitude: 1 V
Lead Channel Pacing Threshold Pulse Width: 0.5 ms
Lead Channel Pacing Threshold Pulse Width: 0.8 ms
Lead Channel Sensing Intrinsic Amplitude: 0.6 mV
Lead Channel Setting Pacing Amplitude: 2 V
Lead Channel Setting Pacing Amplitude: 2.5 V
Lead Channel Setting Sensing Sensitivity: 0.5 mV
MDC IDC LEAD IMPLANT DT: 20060509
MDC IDC LEAD IMPLANT DT: 20090417
MDC IDC LEAD LOCATION: 753860
MDC IDC MSMT BATTERY VOLTAGE: 2.93 V
MDC IDC MSMT LEADCHNL LV IMPEDANCE VALUE: 400 Ohm
MDC IDC MSMT LEADCHNL RA IMPEDANCE VALUE: 380 Ohm
MDC IDC MSMT LEADCHNL RA PACING THRESHOLD AMPLITUDE: 0.75 V
MDC IDC MSMT LEADCHNL RV PACING THRESHOLD PULSEWIDTH: 0.8 ms
MDC IDC MSMT LEADCHNL RV SENSING INTR AMPL: 12 mV
MDC IDC SET LEADCHNL LV PACING AMPLITUDE: 2 V
MDC IDC SET LEADCHNL LV PACING PULSEWIDTH: 0.8 ms
MDC IDC SET LEADCHNL RV PACING PULSEWIDTH: 0.8 ms
MDC IDC STAT BRADY AP VP PERCENT: 95 %
MDC IDC STAT BRADY AP VS PERCENT: 1 %
MDC IDC STAT BRADY AS VP PERCENT: 4.1 %
MDC IDC STAT BRADY RA PERCENT PACED: 95 %
Pulse Gen Serial Number: 7138995

## 2017-08-18 DIAGNOSIS — E7849 Other hyperlipidemia: Secondary | ICD-10-CM | POA: Diagnosis not present

## 2017-08-18 DIAGNOSIS — E038 Other specified hypothyroidism: Secondary | ICD-10-CM | POA: Diagnosis not present

## 2017-08-18 DIAGNOSIS — Z125 Encounter for screening for malignant neoplasm of prostate: Secondary | ICD-10-CM | POA: Diagnosis not present

## 2017-08-18 DIAGNOSIS — R7309 Other abnormal glucose: Secondary | ICD-10-CM | POA: Diagnosis not present

## 2017-08-18 DIAGNOSIS — I480 Paroxysmal atrial fibrillation: Secondary | ICD-10-CM | POA: Diagnosis not present

## 2017-08-18 DIAGNOSIS — R82998 Other abnormal findings in urine: Secondary | ICD-10-CM | POA: Diagnosis not present

## 2017-08-18 DIAGNOSIS — I10 Essential (primary) hypertension: Secondary | ICD-10-CM | POA: Diagnosis not present

## 2017-08-21 DIAGNOSIS — N184 Chronic kidney disease, stage 4 (severe): Secondary | ICD-10-CM | POA: Diagnosis not present

## 2017-08-21 DIAGNOSIS — I130 Hypertensive heart and chronic kidney disease with heart failure and stage 1 through stage 4 chronic kidney disease, or unspecified chronic kidney disease: Secondary | ICD-10-CM | POA: Diagnosis not present

## 2017-08-21 DIAGNOSIS — M545 Low back pain, unspecified: Secondary | ICD-10-CM | POA: Insufficient documentation

## 2017-08-21 DIAGNOSIS — I700 Atherosclerosis of aorta: Secondary | ICD-10-CM | POA: Diagnosis not present

## 2017-08-21 DIAGNOSIS — Z6828 Body mass index (BMI) 28.0-28.9, adult: Secondary | ICD-10-CM | POA: Diagnosis not present

## 2017-08-21 DIAGNOSIS — Z950 Presence of cardiac pacemaker: Secondary | ICD-10-CM | POA: Diagnosis not present

## 2017-08-21 DIAGNOSIS — I480 Paroxysmal atrial fibrillation: Secondary | ICD-10-CM | POA: Diagnosis not present

## 2017-08-21 DIAGNOSIS — C679 Malignant neoplasm of bladder, unspecified: Secondary | ICD-10-CM | POA: Diagnosis not present

## 2017-08-21 DIAGNOSIS — J449 Chronic obstructive pulmonary disease, unspecified: Secondary | ICD-10-CM | POA: Diagnosis not present

## 2017-08-21 DIAGNOSIS — Z0000 Encounter for general adult medical examination without abnormal findings: Secondary | ICD-10-CM | POA: Diagnosis not present

## 2017-08-21 DIAGNOSIS — I509 Heart failure, unspecified: Secondary | ICD-10-CM | POA: Diagnosis not present

## 2017-08-21 DIAGNOSIS — Z1389 Encounter for screening for other disorder: Secondary | ICD-10-CM | POA: Diagnosis not present

## 2017-09-16 DIAGNOSIS — L821 Other seborrheic keratosis: Secondary | ICD-10-CM | POA: Diagnosis not present

## 2017-09-16 DIAGNOSIS — L570 Actinic keratosis: Secondary | ICD-10-CM | POA: Diagnosis not present

## 2017-09-16 DIAGNOSIS — Z85828 Personal history of other malignant neoplasm of skin: Secondary | ICD-10-CM | POA: Diagnosis not present

## 2017-09-16 DIAGNOSIS — L812 Freckles: Secondary | ICD-10-CM | POA: Diagnosis not present

## 2017-10-12 ENCOUNTER — Emergency Department (HOSPITAL_COMMUNITY): Payer: Medicare Other

## 2017-10-12 ENCOUNTER — Encounter (HOSPITAL_COMMUNITY): Payer: Self-pay | Admitting: Emergency Medicine

## 2017-10-12 ENCOUNTER — Inpatient Hospital Stay (HOSPITAL_COMMUNITY)
Admission: EM | Admit: 2017-10-12 | Discharge: 2017-10-15 | DRG: 291 | Disposition: A | Discharge status: Home Health | Payer: Medicare Other | Attending: Internal Medicine | Admitting: Internal Medicine

## 2017-10-12 DIAGNOSIS — J209 Acute bronchitis, unspecified: Secondary | ICD-10-CM | POA: Diagnosis present

## 2017-10-12 DIAGNOSIS — I252 Old myocardial infarction: Secondary | ICD-10-CM

## 2017-10-12 DIAGNOSIS — I2510 Atherosclerotic heart disease of native coronary artery without angina pectoris: Secondary | ICD-10-CM | POA: Diagnosis present

## 2017-10-12 DIAGNOSIS — J440 Chronic obstructive pulmonary disease with acute lower respiratory infection: Secondary | ICD-10-CM | POA: Diagnosis not present

## 2017-10-12 DIAGNOSIS — Z4502 Encounter for adjustment and management of automatic implantable cardiac defibrillator: Secondary | ICD-10-CM

## 2017-10-12 DIAGNOSIS — Z95810 Presence of automatic (implantable) cardiac defibrillator: Secondary | ICD-10-CM

## 2017-10-12 DIAGNOSIS — N184 Chronic kidney disease, stage 4 (severe): Secondary | ICD-10-CM | POA: Diagnosis not present

## 2017-10-12 DIAGNOSIS — I248 Other forms of acute ischemic heart disease: Secondary | ICD-10-CM | POA: Diagnosis present

## 2017-10-12 DIAGNOSIS — I5043 Acute on chronic combined systolic (congestive) and diastolic (congestive) heart failure: Secondary | ICD-10-CM | POA: Diagnosis not present

## 2017-10-12 DIAGNOSIS — J9601 Acute respiratory failure with hypoxia: Secondary | ICD-10-CM | POA: Diagnosis present

## 2017-10-12 DIAGNOSIS — I5023 Acute on chronic systolic (congestive) heart failure: Secondary | ICD-10-CM | POA: Diagnosis present

## 2017-10-12 DIAGNOSIS — I255 Ischemic cardiomyopathy: Secondary | ICD-10-CM | POA: Diagnosis not present

## 2017-10-12 DIAGNOSIS — I10 Essential (primary) hypertension: Secondary | ICD-10-CM

## 2017-10-12 DIAGNOSIS — I130 Hypertensive heart and chronic kidney disease with heart failure and stage 1 through stage 4 chronic kidney disease, or unspecified chronic kidney disease: Principal | ICD-10-CM | POA: Diagnosis present

## 2017-10-12 DIAGNOSIS — Z9861 Coronary angioplasty status: Secondary | ICD-10-CM

## 2017-10-12 DIAGNOSIS — Z8551 Personal history of malignant neoplasm of bladder: Secondary | ICD-10-CM

## 2017-10-12 DIAGNOSIS — N179 Acute kidney failure, unspecified: Secondary | ICD-10-CM | POA: Diagnosis present

## 2017-10-12 DIAGNOSIS — I361 Nonrheumatic tricuspid (valve) insufficiency: Secondary | ICD-10-CM | POA: Diagnosis not present

## 2017-10-12 DIAGNOSIS — I480 Paroxysmal atrial fibrillation: Secondary | ICD-10-CM | POA: Diagnosis present

## 2017-10-12 DIAGNOSIS — R0602 Shortness of breath: Secondary | ICD-10-CM | POA: Diagnosis not present

## 2017-10-12 DIAGNOSIS — I5021 Acute systolic (congestive) heart failure: Secondary | ICD-10-CM | POA: Diagnosis not present

## 2017-10-12 DIAGNOSIS — I5022 Chronic systolic (congestive) heart failure: Secondary | ICD-10-CM

## 2017-10-12 DIAGNOSIS — Z87891 Personal history of nicotine dependence: Secondary | ICD-10-CM | POA: Diagnosis not present

## 2017-10-12 DIAGNOSIS — I4891 Unspecified atrial fibrillation: Secondary | ICD-10-CM

## 2017-10-12 LAB — URINALYSIS, ROUTINE W REFLEX MICROSCOPIC
BILIRUBIN URINE: NEGATIVE
GLUCOSE, UA: NEGATIVE mg/dL
KETONES UR: NEGATIVE mg/dL
LEUKOCYTES UA: NEGATIVE
NITRITE: NEGATIVE
PH: 5 (ref 5.0–8.0)
Protein, ur: 30 mg/dL — AB
Specific Gravity, Urine: 1.013 (ref 1.005–1.030)
Squamous Epithelial / LPF: NONE SEEN

## 2017-10-12 LAB — CBC
HCT: 39.7 % (ref 39.0–52.0)
HEMOGLOBIN: 13 g/dL (ref 13.0–17.0)
MCH: 31 pg (ref 26.0–34.0)
MCHC: 32.7 g/dL (ref 30.0–36.0)
MCV: 94.7 fL (ref 78.0–100.0)
Platelets: 198 10*3/uL (ref 150–400)
RBC: 4.19 MIL/uL — ABNORMAL LOW (ref 4.22–5.81)
RDW: 14.2 % (ref 11.5–15.5)
WBC: 15.7 10*3/uL — ABNORMAL HIGH (ref 4.0–10.5)

## 2017-10-12 LAB — COMPREHENSIVE METABOLIC PANEL
ALBUMIN: 3.7 g/dL (ref 3.5–5.0)
ALT: 12 U/L — ABNORMAL LOW (ref 17–63)
ANION GAP: 14 (ref 5–15)
AST: 46 U/L — AB (ref 15–41)
Alkaline Phosphatase: 71 U/L (ref 38–126)
BUN: 23 mg/dL — ABNORMAL HIGH (ref 6–20)
CHLORIDE: 103 mmol/L (ref 101–111)
CO2: 20 mmol/L — ABNORMAL LOW (ref 22–32)
Calcium: 8.6 mg/dL — ABNORMAL LOW (ref 8.9–10.3)
Creatinine, Ser: 2.51 mg/dL — ABNORMAL HIGH (ref 0.61–1.24)
GFR, EST AFRICAN AMERICAN: 26 mL/min — AB (ref >60)
GFR, EST NON AFRICAN AMERICAN: 22 mL/min — AB (ref >60)
Glucose, Bld: 152 mg/dL — ABNORMAL HIGH (ref 65–99)
POTASSIUM: 4.8 mmol/L (ref 3.5–5.1)
SODIUM: 137 mmol/L (ref 135–145)
Total Bilirubin: 1.6 mg/dL — ABNORMAL HIGH (ref 0.3–1.2)
Total Protein: 7.4 g/dL (ref 6.5–8.1)

## 2017-10-12 LAB — BRAIN NATRIURETIC PEPTIDE: B Natriuretic Peptide: 626.2 pg/mL — ABNORMAL HIGH (ref 0.0–100.0)

## 2017-10-12 LAB — I-STAT CG4 LACTIC ACID, ED: Lactic Acid, Venous: 1.78 mmol/L (ref 0.5–1.9)

## 2017-10-12 LAB — I-STAT TROPONIN, ED: TROPONIN I, POC: 0.02 ng/mL (ref 0.00–0.08)

## 2017-10-12 MED ORDER — IPRATROPIUM BROMIDE 0.02 % IN SOLN
0.5000 mg | Freq: Once | RESPIRATORY_TRACT | Status: AC
  Administered 2017-10-12: 0.5 mg via RESPIRATORY_TRACT
  Filled 2017-10-12: qty 2.5

## 2017-10-12 MED ORDER — FUROSEMIDE 10 MG/ML IJ SOLN
40.0000 mg | Freq: Once | INTRAMUSCULAR | Status: AC
  Administered 2017-10-12: 40 mg via INTRAVENOUS
  Filled 2017-10-12: qty 4

## 2017-10-12 MED ORDER — MORPHINE SULFATE (PF) 4 MG/ML IV SOLN
1.0000 mg | Freq: Once | INTRAVENOUS | Status: AC
  Administered 2017-10-12: 1 mg via INTRAVENOUS
  Filled 2017-10-12: qty 1

## 2017-10-12 MED ORDER — ALBUTEROL SULFATE (2.5 MG/3ML) 0.083% IN NEBU
5.0000 mg | INHALATION_SOLUTION | Freq: Once | RESPIRATORY_TRACT | Status: AC
  Administered 2017-10-12: 5 mg via RESPIRATORY_TRACT
  Filled 2017-10-12: qty 6

## 2017-10-12 MED ORDER — METHYLPREDNISOLONE SODIUM SUCC 125 MG IJ SOLR
80.0000 mg | Freq: Two times a day (BID) | INTRAMUSCULAR | Status: DC
  Administered 2017-10-13: 80 mg via INTRAVENOUS
  Filled 2017-10-12: qty 2

## 2017-10-12 MED ORDER — VANCOMYCIN HCL IN DEXTROSE 1-5 GM/200ML-% IV SOLN: 1000.0000 mg | Freq: Once | INTRAVENOUS | Status: DC

## 2017-10-12 MED ORDER — VANCOMYCIN HCL 10 G IV SOLR
1500.0000 mg | Freq: Once | INTRAVENOUS | Status: AC
  Administered 2017-10-12: 1500 mg via INTRAVENOUS
  Filled 2017-10-12: qty 1500

## 2017-10-12 MED ORDER — LEVOFLOXACIN IN D5W 750 MG/150ML IV SOLN
750.0000 mg | Freq: Once | INTRAVENOUS | Status: AC
  Administered 2017-10-12: 750 mg via INTRAVENOUS
  Filled 2017-10-12: qty 150

## 2017-10-12 NOTE — ED Notes (Signed)
Sister in law called to this nurse, pt unable to tolerate oxygen @ 6L via [place].  Respiratory therapy called, this nurse put pt back on bipap at previous settings.  Oxygen saturations increased from 86% to 97%.

## 2017-10-12 NOTE — Progress Notes (Signed)
RT NOTE:  RT assisted with moving patient on BIPAP to B15 without event

## 2017-10-12 NOTE — ED Notes (Signed)
Admitting MD at bedside. Bipap off by respiratory therapist.

## 2017-10-12 NOTE — H&P (Signed)
History and Physical    [name] NOB:[id number] DOB: [birth year] DOA: 10/12/2017  PCP: [name], MD  Patient coming from:  home  Chief Complaint:   Sob , cough  HPI: [name] is a 83 y.o. male with medical history significant of CHF last EF 2017 35%, afib on eloquis (but ran out recently), CKD baseline cr 2, CAD, COPD, not on home oxygen comes in with several days of worsening sob and cough.  Pts wife just recently had uri also.  However pt reports 2 days ago he sawed some wood and inhaled a bunch of stuff and it started his sob and cough.  No fevers.  He does not smoke.  No chills.  No swelling.  Some nasal congestion.  He called his pcp on the phone today who called him in some levaquin, he has taken one dose.  Pt got very sob this afternoon ems was called and he had to be put on cpap due to hypoxia.  Pt has been on bipap in ED and is much improved after several nebs.  Lasix also given.  Pt referred for admission for acute respiratory failure.    Review of Systems: As per HPI otherwise 10 point review of systems negative.   Past Medical History:  Diagnosis Date  . Acute bronchitis with COPD ([place]) 09/10/2016  . Anemia   . Atrial fibrillation or flutter    maintaining sinus on amiodarone    . Bladder cancer ([place]) dx'd 05/2012  . BPH (benign prostatic hyperplasia)   . CAD (coronary artery disease)     a. s/p anterior MI 12/05 c/b shock -> stent LAD   b. s/p stenting OM-1, 2/06  . CHF (congestive heart failure) (HCC)    due to ischemic CM  a. EF 20-30%. (Nov 2008)   b. s/p St. Jude BiV-ICD    c. CPX 07/2008  pvo2 16.3 (63% predicted) slope 34 RER 1.08 O2 pulse 93%  . Cough    occasional /productive, no fever/ not new  . CRI (chronic renal insufficiency)    (baseline 2.0-2.2)/ recent hospitalization 8/13  . GERD (gastroesophageal reflux disease)    hx Barretts esophagitis  . History of blood transfusion    following coumadin  usage  . HTN (hypertension)    EKG  05/14/12,Chest x ray 8/13, last ICD interrogation 8/13 EPIC  . Hyperlipidemia   . Hypothyroidism   . 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 Skin cancer    basal cells   facial x 4, 1 right forearm  . Sleep apnea    STOP BANG SCORE 4    Past Surgical History:  Procedure Laterality Date  . [place]   lower back  . CARDIAC DEFIBRILLATOR PLACEMENT     ICD St Jude; gen change 09-20-13  . CERVICAL LAMINECTOMY  1985  . COLONOSCOPY    . CORONARY ANGIOPLASTY  2005,2006  . CYSTOSCOPY W/ RETROGRADES  05/25/2012   Procedure: CYSTOSCOPY WITH RETROGRADE PYELOGRAM;  Surgeon: Molli Hazard, MD;  Location: [facility];  Service: Urology;  Laterality: Bilateral;  . CYSTOSCOPY W/ URETERAL STENT PLACEMENT  07/08/2012   Procedure: CYSTOSCOPY WITH STENT REPLACEMENT;  Surgeon: Molli Hazard, MD;  Location: [facility];  Service: Urology;  Laterality: Left;  . CYSTOSCOPY W/ URETERAL STENT REMOVAL  07/08/2012   Procedure: CYSTOSCOPY WITH STENT  REMOVAL;  Surgeon: Molli Hazard, MD;  Location: [facility];  Service: Urology;  Laterality: Bilateral;  . CYSTOSCOPY/RETROGRADE/URETEROSCOPY  07/08/2012   Procedure: CYSTOSCOPY/RETROGRADE/URETEROSCOPY;  Surgeon: Molli Hazard, MD;  Location: [facility];  Service: Urology;  Laterality: Left;  . ESOPHAGOGASTRODUODENOSCOPY    . HERNIA REPAIR  1989   bilateral  . IMPLANTABLE CARDIOVERTER DEFIBRILLATOR (ICD) GENERATOR CHANGE N/A 09/20/2013   Procedure: ICD GENERATOR CHANGE;  Surgeon: Deboraha Sprang, MD;  Location: [facility] CATH LAB;  Service: Cardiovascular;  Laterality: N/A;  . TRANSURETHRAL RESECTION OF BLADDER TUMOR  05/25/2012   cold cup biopsy prostate  . TRANSURETHRAL RESECTION OF BLADDER TUMOR  07/08/2012   Procedure: TRANSURETHRAL RESECTION OF BLADDER TUMOR (TURBT);  Surgeon: Molli Hazard, MD;  Location: [facility];  Service:  Urology;  Laterality: N/A;  CYSTO, TURBT W/ GYRUS, BILATERAL STENT REMOVAL, LEFT URETEROSCOPY WITH BIOPSY, POSSIBLE LEFT STENT PLACEMENT      reports that he quit smoking about 13 years ago. His smoking use included cigarettes and cigars. He quit smokeless tobacco use about 18 years ago. He reports that he does not drink alcohol or use drugs.  Allergies  Allergen Reactions  . Ramipril Other (See Comments)    cough  . Penicillins Itching and Rash    Has patient had a PCN reaction causing immediate rash, facial/tongue/throat swelling, SOB or lightheadedness with hypotension: No Has patient had a PCN reaction causing severe rash involving mucus membranes or skin necrosis: Yes-back and hands. Has patient had a PCN reaction that required hospitalization: No Has patient had a PCN reaction occurring within the last 10 years: Yes If all of the above answers are "NO", then may proceed with Cephalosporin use.    Family History  Problem Relation Age of Onset  . Coronary artery disease Unknown   . Stroke Unknown     Prior to Admission medications   Medication Sig Start Date End Date Taking? Authorizing Provider  acetaminophen (TYLENOL) 325 MG tablet Take 2 tablets (650 mg total) by mouth every 6 (six) hours as needed for mild pain (or Fever >/= 101). Patient taking differently: Take 1,000 mg by mouth every 6 (six) hours as needed for mild pain or headache.  08/18/13  Yes [name], MD  amiodarone (PACERONE) 100 MG tablet Take 1 tablet (100 mg total) by mouth daily. 03/06/17  Yes Larey Dresser, MD  apixaban (ELIQUIS) 2.5 MG TABS tablet Take 1 tablet (2.5 mg total) by mouth 2 (two) times daily. Needs office visit Patient taking differently: Take 2.5 mg by mouth 2 (two) times daily.  06/30/17  Yes Bensimhon, Shaune Pascal, MD  carvedilol (COREG) 6.25 MG tablet Take 1 tablet (6.25 mg total) by mouth 2 (two) times daily with a meal. NEEDS FOLLOW UP 06/2017 Patient taking differently: Take 6.25 mg by  mouth 2 (two) times daily with a meal.  06/24/17  Yes Larey Dresser, MD  diphenhydramine-acetaminophen (TYLENOL PM) 25-500 MG TABS tablet Take 1 tablet by mouth at bedtime as needed (sleep).   Yes [provider]  ezetimibe (ZETIA) 10 MG tablet TAKE ONE TABLET BY MOUTH ONCE DAILY WITH SUPPER Patient taking differently: TAKE ONE TABLET (10mg ) BY MOUTH ONCE DAILY WITH SUPPER 03/06/17  Yes Larey Dresser, MD  ferrous sulfate ([facility] IRON) 325 (65 FE) MG tablet Take 325 mg by mouth daily with supper.    Yes [provider]  furosemide (LASIX) 20 MG tablet Take 20 mg by mouth daily.  08/21/13  Yes [provider]  hydrOXYzine (ATARAX/VISTARIL) 25 MG tablet Take 1 tablet by mouth at bedtime as needed for itching (legs.). Reported on 10/19/2015 11/16/13  Yes [provider]  KLOR-CON M10 10 MEQ tablet Take 10 mEq by mouth daily.  06/26/13  Yes [provider]  levothyroxine (SYNTHROID, LEVOTHROID) 50 MCG tablet Take 25-50 mcg by mouth daily before breakfast. Take 50 mcg (1 tablet) on Tues, Thurs, Sat, Sun and 25 mcg (1/2 tablet) on Mon, Wed, Fri   Yes [provider]  losartan (COZAAR) 50 MG tablet Take 0.5 tablets (25 mg total) by mouth daily. Patient taking differently: Take 25 mg by mouth See admin instructions. Daily if SBP is > 100 05/09/17 10/12/17 Yes Baldwin [place], PA-C  Magnesium Hydroxide (MILK OF MAGNESIA PO) Take 20 mLs by mouth daily as needed (for constipation).   Yes [provider]  Multiple Vitamins-Minerals (CENTRUM SILVER PO) Take 1 tablet by mouth at bedtime.    Yes [provider]  nitroGLYCERIN (NITROSTAT) 0.4 MG SL tablet DISSOLVE ONE TABLET UNDER THE TONGUE EVERY 5 MINUTES AS NEEDED FOR CHEST PAIN.  DO NOT EXCEED A TOTAL OF 3 DOSES IN 15 MINUTES 12/26/16  Yes Larey Dresser, MD  pantoprazole (PROTONIX) 40 MG tablet Take 40 mg by mouth daily.  01/15/11  Yes [provider]  Pumpkin Seed-Soy Germ (AZO BLADDER  CONTROL/GO-LESS) CAPS Take 1 capsule by mouth daily as needed (for bladder incontinence).    Yes [provider]  rosuvastatin (CRESTOR) 20 MG tablet Take 1 tablet (20 mg total) by mouth daily. Patient taking differently: Take 20 mg by mouth at bedtime.  10/18/15  Yes Bensimhon, Shaune Pascal, MD  sodium chloride (OCEAN) 0.65 % SOLN nasal spray Place 1-2 sprays into both nostrils daily as needed for congestion.    Yes [provider]  tamsulosin (FLOMAX) 0.4 MG CAPS capsule Take 0.4 mg by mouth at bedtime.  11/23/13  Yes [provider]  acetaminophen (TYLENOL) 500 MG tablet Take 1,000 mg by mouth every 6 (six) hours as needed for moderate pain.    [provider]  lidocaine (LIDODERM) 5 % Place 1 patch onto the skin daily. Remove & Discard patch within 12 hours or as directed by MD Patient not taking: Reported on 10/12/2017 07/28/17   Margette Fast, MD    Physical Exam: Vitals:   10/12/17 2100 10/12/17 2130 10/12/17 2200 10/12/17 2230  BP: (!) 147/64 (!) 108/56 118/68 110/66  Pulse: (!) 105 90 83 76  Resp: (!) 31 (!) 33 (!) 26 (!) 23  Temp:      TempSrc:      SpO2: 97% 98% 99% 99%  Weight:          Constitutional: NAD, calm, comfortable Vitals:   10/12/17 2100 10/12/17 2130 10/12/17 2200 10/12/17 2230  BP: (!) 147/64 (!) 108/56 118/68 110/66  Pulse: (!) 105 90 83 76  Resp: (!) 31 (!) 33 (!) 26 (!) 23  Temp:      TempSrc:      SpO2: 97% 98% 99% 99%  Weight:       Eyes: PERRL, lids and conjunctivae normal ENMT: Mucous membranes are moist. Posterior pharynx clear of any exudate or lesions.Normal dentition.  Neck: normal, supple, no masses, no thyromegaly Respiratory: bilateral diffuse wheezing with rhonchi, no crackles. Normal respiratory effort. No accessory muscle use.  Cardiovascular: Regular rate and rhythm, no murmurs / rubs / gallops. No extremity edema. 2+ pedal pulses. No carotid bruits.  Abdomen: no tenderness, no masses palpated. No  hepatosplenomegaly. Bowel sounds positive.  Musculoskeletal: no clubbing / cyanosis. No joint deformity upper and lower extremities. Good ROM, no contractures. Normal muscle tone.  Skin: no rashes, lesions, ulcers. No induration Neurologic: CN 2-12 grossly intact. Sensation intact, DTR normal. Strength 5/5 in all 4.  Psychiatric: Normal judgment and insight. Alert and oriented x 3. Normal mood.    Labs on Admission: I have personally reviewed following labs and imaging studies  CBC: Recent Labs  Lab 10/12/17 1847  WBC 15.7*  HGB 13.0  HCT 39.7  MCV 94.7  PLT 850   Basic Metabolic Panel: Recent Labs  Lab 10/12/17 1847  NA 137  K 4.8  CL 103  CO2 20*  GLUCOSE 152*  BUN 23*  CREATININE 2.51*  CALCIUM 8.6*   GFR: Estimated Creatinine Clearance: 23 mL/min (A) (by C-G formula based on SCr of 2.51 mg/dL (H)). Liver Function Tests: Recent Labs  Lab 10/12/17 1847  AST 46*  ALT 12*  ALKPHOS 71  BILITOT 1.6*  PROT 7.4  ALBUMIN 3.7   No results for input(s): LIPASE, AMYLASE in the last 168 hours. No results for input(s): AMMONIA in the last 168 hours. Coagulation Profile: No results for input(s): INR, PROTIME in the last 168 hours. Cardiac Enzymes: No results for input(s): CKTOTAL, CKMB, CKMBINDEX, TROPONINI in the last 168 hours. BNP (last 3 results) No results for input(s): PROBNP in the last 8760 hours. HbA1C: No results for input(s): HGBA1C in the last 72 hours. CBG: No results for input(s): GLUCAP in the last 168 hours. Lipid Profile: No results for input(s): CHOL, HDL, LDLCALC, TRIG, CHOLHDL, LDLDIRECT in the last 72 hours. Thyroid Function Tests: No results for input(s): TSH, T4TOTAL, FREET4, T3FREE, THYROIDAB in the last 72 hours. Anemia Panel: No results for input(s): VITAMINB12, FOLATE, FERRITIN, TIBC, IRON, RETICCTPCT in the last 72 hours. Urine analysis:    Component Value Date/Time   COLORURINE YELLOW 10/12/2017 2123   APPEARANCEUR CLEAR 10/12/2017  2123   LABSPEC 1.013 10/12/2017 2123   PHURINE 5.0 10/12/2017 2123   GLUCOSEU NEGATIVE 10/12/2017 2123   HGBUR MODERATE (A) 10/12/2017 2123   BILIRUBINUR NEGATIVE 10/12/2017 2123   [place] 10/12/2017 2123   PROTEINUR 30 (A) 10/12/2017 2123   UROBILINOGEN 0.2 08/15/2013 1132   NITRITE NEGATIVE 10/12/2017 2123   LEUKOCYTESUR NEGATIVE 10/12/2017 2123   Sepsis Labs: !!!!!!!!!!!!!!!!!!!!!!!!!!!!!!!!!!!!!!!!!!!! @LABRCNTIP (procalcitonin:4,lacticidven:4) )No results found for this or any previous visit (from the past 240 hour(s)).   Radiological Exams on Admission: Dg Chest Portable 1 View  Result Date: 10/12/2017 CLINICAL DATA:  Short of breath EXAM: PORTABLE CHEST 1 VIEW COMPARISON:  09/11/2016 FINDINGS: Cardiac enlargement with vascular congestion. Probable mild interstitial edema. No effusion. Cardiac AICD unchanged. IMPRESSION: Pulmonary vascular congestion with mild interstitial edema. Electronically Signed   By: Franchot Gallo M.D.   On: 10/12/2017 20:19    EKG: Independently reviewed. rbbb old  Old chart reviewed Case discussed with edp cxr reviewed edema   Assessment/Plan 83 yo male with acute hypoxic respiratory failure likely multifactorial  Principal Problem:   Acute respiratory failure with hypoxia ([place])- multifactorial.  Wean bipap as tolerates.  Pt with a lot of wheezing on exam, not particularly volume overloaded.  Improved with mostly nebs.  tx for copde and chf as below.  Active Problems:   Essential hypertension, benign- stable, cont home meds, hold ARB due to mild bump in cr   CAD, NATIVE VESSEL- noted, serial trop overnight   Atrial  fibrillation ([place])- resume eloquis, nsr now   Chronic systolic CHF (congestive heart failure) (HCC)-EF 25%- see below   ICD (implantable cardioverter-defibrillator) in place (St Jude pacer/ICD)- stable   CKD (chronic kidney disease) stage 4, GFR 15-29 ml/min (HCC)- stable, bump up to 2.5, monitor daily   Acute bronchitis  with COPD ([place])- freq nebs.  levaquin for possible component of cap which was started today by PCP, steroids   Acute systolic CHF (congestive heart failure) ([place])- give lasix 40mg  iv daily for next several days and follow clinically     DVT prophylaxis:  eloquis Code Status:  full Family Communication:  wife Disposition Plan:  Per day team Consults called:  none Admission status:  admission   Lockie Bothun A MD [facility]  If 7PM-7AM, please contact night-coverage [email or web address] Password [email or web address]  10/12/2017, 10:54 PM

## 2017-10-12 NOTE — ED Provider Notes (Signed)
[facility] EMERGENCY DEPARTMENT Provider Note   CSN: [id number] Arrival date & time: 10/12/17  1839     History   Chief Complaint Chief Complaint  Patient presents with  . Shortness of Breath    HPI [name] is a 83 y.o. male.  HPI  Patient with history of COPD A. fib CHF presenting with shortness of breath.  He had had some cough and congestion for the past couple of days.  He called his primary care doctor who prescribed Levaquin.  He took 1 dose of Levaquin today.  This evening he was up and walking at home and developed worsening shortness of breath.  EMS was called his O2 sat was 88%.  Due to his work of breathing he was placed on CPAP by EMS.  Family denies any fever.  He has not had any significant swelling of his legs.  They state he is compliant with his medications although since he did not feel well today he has not taken his Lasix for today.  There are no other associated systemic symptoms, there are no other alleviating or modifying factors.   Past Medical History:  Diagnosis Date  . Acute bronchitis with COPD ([place]) 09/10/2016  . Anemia   . Atrial fibrillation or flutter    maintaining sinus on amiodarone    . Bladder cancer ([place]) dx'd 05/2012  . BPH (benign prostatic hyperplasia)   . CAD (coronary artery disease)     a. s/p anterior MI 12/05 c/b shock -> stent LAD   b. s/p stenting OM-1, 2/06  . CHF (congestive heart failure) (HCC)    due to ischemic CM  a. EF 20-30%. (Nov 2008)   b. s/p St. Jude BiV-ICD    c. CPX 07/2008  pvo2 16.3 (63% predicted) slope 34 RER 1.08 O2 pulse 93%  . Cough    occasional /productive, no fever/ not new  . CRI (chronic renal insufficiency)    (baseline 2.0-2.2)/ recent hospitalization 8/13  . GERD (gastroesophageal reflux disease)    hx Barretts esophagitis  . History of blood transfusion    following coumadin  usage  . HTN (hypertension)    EKG 05/14/12,Chest x ray 8/13, last ICD interrogation 8/13 EPIC    . Hyperlipidemia   . Hypothyroidism   . 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 Skin cancer    basal cells   facial x 4, 1 right forearm  . Sleep apnea    STOP BANG SCORE 4    Patient Active Problem List   Diagnosis Date Noted  . Acute systolic CHF (congestive heart failure) ([place]) 10/12/2017  . Acute respiratory failure with hypoxia ([place]) 09/10/2016  . Acute bronchitis with COPD ([place]) 09/10/2016  . CKD (chronic kidney disease) stage 4, GFR 15-29 ml/min (HCC) 04/04/2015  . 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 Urosepsis 08/16/2013  . Bruising 08/12/2012  . Bladder cancer ([place]) 07/08/2012  . Bladder tumor 05/18/2012  . Knee pain, acute, right 02/11/2012  . CAROTID ARTERY DISEASE 12/06/2010  . Essential hypertension, benign 09/22/2009  . Chronic systolic CHF (congestive heart failure) (HCC)-EF 25% 09/14/2009  . CHEST PAIN 08/08/2009  . Mixed hyperlipidemia 05/25/2009  . CAD, NATIVE VESSEL 02/01/2009  . Ischemic cardiomyopathy 01/24/2009  . Atrial fibrillation ([place]) 01/21/2009  . ICD (implantable cardioverter-defibrillator) in place (  St Jude pacer/ICD) 01/21/2009    Past Surgical History:  Procedure Laterality Date  . [place]   lower back  . CARDIAC DEFIBRILLATOR PLACEMENT     ICD St Jude; gen change 09-20-13  . CERVICAL LAMINECTOMY  1985  . COLONOSCOPY    . CORONARY ANGIOPLASTY  2005,2006  . CYSTOSCOPY W/ RETROGRADES  05/25/2012   Procedure: CYSTOSCOPY WITH RETROGRADE PYELOGRAM;  Surgeon: Molli Hazard, MD;  Location: [facility];  Service: Urology;  Laterality: Bilateral;  . CYSTOSCOPY W/ URETERAL STENT PLACEMENT  07/08/2012   Procedure: CYSTOSCOPY WITH STENT REPLACEMENT;  Surgeon: Molli Hazard, MD;  Location: [facility];  Service: Urology;  Laterality: Left;  . CYSTOSCOPY W/ URETERAL STENT  REMOVAL  07/08/2012   Procedure: CYSTOSCOPY WITH STENT REMOVAL;  Surgeon: Molli Hazard, MD;  Location: [facility];  Service: Urology;  Laterality: Bilateral;  . CYSTOSCOPY/RETROGRADE/URETEROSCOPY  07/08/2012   Procedure: CYSTOSCOPY/RETROGRADE/URETEROSCOPY;  Surgeon: Molli Hazard, MD;  Location: [facility];  Service: Urology;  Laterality: Left;  . ESOPHAGOGASTRODUODENOSCOPY    . HERNIA REPAIR  1989   bilateral  . IMPLANTABLE CARDIOVERTER DEFIBRILLATOR (ICD) GENERATOR CHANGE N/A 09/20/2013   Procedure: ICD GENERATOR CHANGE;  Surgeon: Deboraha Sprang, MD;  Location[facility] C[facility] LAB;  Service: Cardiovascular;  Laterality: N/A;  . TRANSURETHRAL RESECTION OF BLADDER TUMOR  05/25/2012   cold cup biopsy prostate  . TRANSURETHRAL RESECTION OF BLADDER TUMOR  07/08/2012   Procedure: TRANSURETHRAL RESECTION OF BLADDER TUMOR (TURBT);  Surgeon: Molli Hazard, MD;  Location: [facility];  Service: Urology;  Laterality: N/A;  CYSTO, TURBT W/ GYRUS, BILATERAL STENT REMOVAL, LEFT URETEROSCOPY WITH BIOPSY, POSSIBLE LEFT STENT PLACEMENT        Home Medications    Prior to Admission medications   Medication Sig Start Date End Date Taking? Authorizing Provider  acetaminophen (TYLENOL) 325 MG tablet Take 2 tablets (650 mg total) by mouth every 6 (six) hours as needed for mild pain (or Fever >/= 101). Patient taking differently: Take 1,000 mg by mouth every 6 (six) hours as needed for mild pain or headache.  08/18/13  Yes [name], MD  amiodarone (PACERONE) 100 MG tablet Take 1 tablet (100 mg total) by mouth daily. 03/06/17  Yes Larey Dresser, MD  apixaban (ELIQUIS) 2.5 MG TABS tablet Take 1 tablet (2.5 mg total) by mouth 2 (two) times daily. Needs office visit Patient taking differently: Take 2.5 mg by mouth 2 (two) times daily.  06/30/17  Yes Bensimhon, Shaune Pascal, MD  carvedilol (COREG) 6.25 MG tablet Take 1 tablet (6.25 mg total) by mouth 2 (two) times daily with a meal. NEEDS FOLLOW UP 06/2017 Patient  taking differently: Take 6.25 mg by mouth 2 (two) times daily with a meal.  06/24/17  Yes Larey Dresser, MD  diphenhydramine-acetaminophen (TYLENOL PM) 25-500 MG TABS tablet Take 1 tablet by mouth at bedtime as needed (sleep).   Yes [provider]  ezetimibe (ZETIA) 10 MG tablet TAKE ONE TABLET BY MOUTH ONCE DAILY WITH SUPPER Patient taking differently: TAKE ONE TABLET (10mg ) BY MOUTH ONCE DAILY WITH SUPPER 03/06/17  Yes Larey Dresser, MD  ferrous sulfate ([employer] IRON) 325 (65 FE) MG tablet Take 325 mg by mouth daily with supper.    Yes [provider]  furosemide (LASIX) 20 MG tablet Take 20 mg by mouth daily.  08/21/13  Yes [provider]  hydrOXYzine (ATARAX/VISTARIL) 25 MG tablet Take 1 tablet by mouth at bedtime as needed for  itching (legs.). Reported on 10/19/2015 11/16/13  Yes [provider]  KLOR-CON M10 10 MEQ tablet Take 10 mEq by mouth daily.  06/26/13  Yes [provider]  levothyroxine (SYNTHROID, LEVOTHROID) 50 MCG tablet Take 25-50 mcg by mouth daily before breakfast. Take 50 mcg (1 tablet) on Tues, Thurs, Sat, Sun and 25 mcg (1/2 tablet) on Mon, Wed, Fri   Yes [provider]  losartan (COZAAR) 50 MG tablet Take 0.5 tablets (25 mg total) by mouth daily. Patient taking differently: Take 25 mg by mouth See admin instructions. Daily if SBP is > 100 05/09/17 10/12/17 Yes Baldwin [place], PA-C  Magnesium Hydroxide (MILK OF MAGNESIA PO) Take 20 mLs by mouth daily as needed (for constipation).   Yes [provider]  Multiple Vitamins-Minerals (CENTRUM SILVER PO) Take 1 tablet by mouth at bedtime.    Yes [provider]  nitroGLYCERIN (NITROSTAT) 0.4 MG SL tablet DISSOLVE ONE TABLET UNDER THE TONGUE EVERY 5 MINUTES AS NEEDED FOR CHEST PAIN.  DO NOT EXCEED A TOTAL OF 3 DOSES IN 15 MINUTES 12/26/16  Yes Larey Dresser, MD  pantoprazole (PROTONIX) 40 MG tablet Take 40 mg by mouth daily.  01/15/11  Yes [provider]    Pumpkin Seed-Soy Germ (AZO BLADDER CONTROL/GO-LESS) CAPS Take 1 capsule by mouth daily as needed (for bladder incontinence).    Yes [provider]  rosuvastatin (CRESTOR) 20 MG tablet Take 1 tablet (20 mg total) by mouth daily. Patient taking differently: Take 20 mg by mouth at bedtime.  10/18/15  Yes Bensimhon, Shaune Pascal, MD  sodium chloride (OCEAN) 0.65 % SOLN nasal spray Place 1-2 sprays into both nostrils daily as needed for congestion.    Yes [provider]  tamsulosin (FLOMAX) 0.4 MG CAPS capsule Take 0.4 mg by mouth at bedtime.  11/23/13  Yes [provider]  acetaminophen (TYLENOL) 500 MG tablet Take 1,000 mg by mouth every 6 (six) hours as needed for moderate pain.    [provider]  lidocaine (LIDODERM) 5 % Place 1 patch onto the skin daily. Remove & Discard patch within 12 hours or as directed by MD Patient not taking: Reported on 10/12/2017 07/28/17   Long, Wonda Olds, MD    Family History Family History  Problem Relation Age of Onset  . Coronary artery disease Unknown   . Stroke Unknown     Social History Social History   Tobacco Use  . Smoking status: Former Smoker    Types: Cigarettes, Cigars    Last attempt to quit: 03/02/2004    Years since quitting: 13.6  . Smokeless tobacco: Former Systems developer    Quit date: 05/20/1999  . Tobacco comment: quit in 2005  Substance Use Topics  . Alcohol use: No  . Drug use: No     Allergies   Ramipril and Penicillins   Review of Systems Review of Systems  ROS reviewed and all otherwise negative except for mentioned in HPI   Physical Exam Updated Vital Signs BP 110/66   Pulse 76   Temp 99.3 F (37.4 C) (Axillary)   Resp (!) 23   Wt 83 kg (183 lb)   SpO2 99%   BMI 26.26 kg/m  Vitals reviewed Physical Exam  Physical Examination: General appearance - alert, ill appearing, and in no distress Mental status - alert, oriented to person, place, and time Eyes - no conjunctival injection, no  scleral icterus Mouth - mucous membranes moist, pharynx normal without lesions Neck - supple,  no significant adenopathy Chest - coarse rhonchi throughout, dyspneic, expiratory wheeze bilaterally, decreased breath sounds , on bipap Heart - normal rate, regular rhythm, normal S1, S2, no murmurs, rubs, clicks or gallops Abdomen - soft, nontender, nondistended, no masses or organomegaly Neurological - alert, oriented, normal speech, no focal findings or movement disorder noted Extremities - peripheral pulses normal, no pedal edema, no clubbing or cyanosis Skin - normal coloration and turgor, no rashes   ED Treatments / Results  Labs (all labs ordered are listed, but only abnormal results are displayed) Labs Reviewed  CBC - Abnormal; Notable for the following components:      Result Value   WBC 15.7 (*)    RBC 4.19 (*)    All other components within normal limits  BRAIN NATRIURETIC PEPTIDE - Abnormal; Notable for the following components:   B Natriuretic Peptide 626.2 (*)    All other components within normal limits  COMPREHENSIVE METABOLIC PANEL - Abnormal; Notable for the following components:   CO2 20 (*)    Glucose, Bld 152 (*)    BUN 23 (*)    Creatinine, Ser 2.51 (*)    Calcium 8.6 (*)    AST 46 (*)    ALT 12 (*)    Total Bilirubin 1.6 (*)    GFR calc non Af Amer 22 (*)    GFR calc Af Amer 26 (*)    All other components within normal limits  URINALYSIS, ROUTINE W REFLEX MICROSCOPIC - Abnormal; Notable for the following components:   Hgb urine dipstick MODERATE (*)    Protein, ur 30 (*)    Bacteria, UA RARE (*)    All other components within normal limits  CULTURE, BLOOD (ROUTINE X 2)  CULTURE, BLOOD (ROUTINE X 2)  I-STAT TROPONIN, ED  I-STAT CG4 LACTIC ACID, ED  I-STAT CG4 LACTIC ACID, ED    EKG  EKG Interpretation  Date/Time:  Sunday October 12 2017 18:44:32 EST Ventricular Rate:  107 PR Interval:    QRS Duration: 179 QT Interval:  415 QTC Calculation: 554 R  Axis:   -93 Text Interpretation:  Sinus tachycardia Right bundle branch block No significant change since last tracing Confirmed by Alfonzo Beers [phone]) on 10/12/2017 7:08:40 PM       Radiology Dg Chest Portable 1 View  Result Date: 10/12/2017 CLINICAL DATA:  Short of breath EXAM: PORTABLE CHEST 1 VIEW COMPARISON:  09/11/2016 FINDINGS: Cardiac enlargement with vascular congestion. Probable mild interstitial edema. No effusion. Cardiac AICD unchanged. IMPRESSION: Pulmonary vascular congestion with mild interstitial edema. Electronically Signed   By: Franchot Gallo M.D.   On: 10/12/2017 20:19    Procedures Procedures (including critical care time)  Medications Ordered in ED Medications  methylPREDNISolone sodium succinate (SOLU-MEDROL) 125 mg/2 mL injection 80 mg (not administered)  albuterol (PROVENTIL) (2.5 MG/3ML) 0.083% nebulizer solution 5 mg (5 mg Nebulization Given 10/12/17 1852)  levofloxacin (LEVAQUIN) IVPB 750 mg (0 mg Intravenous Stopped 10/12/17 2219)  vancomycin (VANCOCIN) 1,500 mg in sodium chloride 0.9 % 500 mL IVPB (0 mg Intravenous Stopped 10/12/17 2219)  furosemide (LASIX) injection 40 mg (40 mg Intravenous Given 10/12/17 2044)  morphine 4 MG/ML injection 1 mg (1 mg Intravenous Given 10/12/17 2131)  albuterol (PROVENTIL) (2.5 MG/3ML) 0.083% nebulizer solution 5 mg (5 mg Nebulization Given 10/12/17 2218)  ipratropium (ATROVENT) nebulizer solution 0.5 mg (0.5 mg Nebulization Given 10/12/17 2218)   CRITICAL CARE Performed by: Marcha Dutton, Zaylie Gisler L Total critical care time: 45 minutes Critical care time was exclusive  of separately billable procedures and treating other patients. Critical care was necessary to treat or prevent imminent or life-threatening deterioration. Critical care was time spent personally by me on the following activities: development of treatment plan with patient and/or surrogate as well as nursing, discussions with consultants, evaluation of patient's  response to treatment, examination of patient, obtaining history from patient or surrogate, ordering and performing treatments and interventions, ordering and review of laboratory studies, ordering and review of radiographic studies, pulse oximetry and re-evaluation of patient's condition.  Initial Impression / Assessment and Plan / ED Course  I have reviewed the triage vital signs and the nursing notes.  Pertinent labs & imaging results that were available during my care of the patient were reviewed by me and considered in my medical decision making (see chart for details).  Patient presenting with shortness of breath after a couple days of upper respiratory type illness.  On arrival he is on CPAP from EMS due to significant dyspnea and hypoxia he was changed over to BiPAP.  He is receiving bronchodilators steroids will also give Lasix based on history of heart failure as well as mild edema on his chest x-ray.  There is no focal infiltrate on his chest x-ray however will cover with antibiotics for pneumonia.  His lactate was normal.  He appears to be improving on BiPAP and with current therapies.    10:30 PM pt is improving on recheck.  We are going to try him off BIPAP- respiratory is here now.   D/w Dr. Shanon Brow for admission.  She will see patient in the ED to decide which type of bed to use.    11:21 PM  Pt did not do well off bipap- he returned to having dyspnea and hypoxia- he was placed back on bipap and appeared much more comfortable  Final Clinical Impressions(s) / ED Diagnoses   Final diagnoses:  Acute respiratory failure with hypoxia [facility])    ED Discharge Orders    None       Pixie Casino, MD 10/12/17 2322

## 2017-10-12 NOTE — ED Notes (Signed)
Moved to room B15

## 2017-10-12 NOTE — ED Triage Notes (Addendum)
Pt arrives via EMS with complaints of SOB. Pt thought he had a cold and called PCP who prescribed some medicine for PNA. Pt was walking around home and suddenly felt SOB and called EMS. Pt arrives on CPAP, O2 sats 88%. Reports not taking lasix today or any morning medications.

## 2017-10-13 ENCOUNTER — Encounter (HOSPITAL_COMMUNITY): Payer: Self-pay | Admitting: *Deleted

## 2017-10-13 ENCOUNTER — Other Ambulatory Visit: Payer: Self-pay

## 2017-10-13 ENCOUNTER — Inpatient Hospital Stay (HOSPITAL_COMMUNITY): Payer: Medicare Other

## 2017-10-13 DIAGNOSIS — I361 Nonrheumatic tricuspid (valve) insufficiency: Secondary | ICD-10-CM

## 2017-10-13 DIAGNOSIS — N184 Chronic kidney disease, stage 4 (severe): Secondary | ICD-10-CM

## 2017-10-13 DIAGNOSIS — I5043 Acute on chronic combined systolic (congestive) and diastolic (congestive) heart failure: Secondary | ICD-10-CM

## 2017-10-13 DIAGNOSIS — I5022 Chronic systolic (congestive) heart failure: Secondary | ICD-10-CM

## 2017-10-13 LAB — BASIC METABOLIC PANEL
ANION GAP: 13 (ref 5–15)
BUN: 26 mg/dL — ABNORMAL HIGH (ref 6–20)
CALCIUM: 8.1 mg/dL — AB (ref 8.9–10.3)
CO2: 19 mmol/L — AB (ref 22–32)
Chloride: 104 mmol/L (ref 101–111)
Creatinine, Ser: 2.76 mg/dL — ABNORMAL HIGH (ref 0.61–1.24)
GFR calc non Af Amer: 20 mL/min — ABNORMAL LOW (ref >60)
GFR, EST AFRICAN AMERICAN: 23 mL/min — AB (ref >60)
GLUCOSE: 160 mg/dL — AB (ref 65–99)
POTASSIUM: 4.1 mmol/L (ref 3.5–5.1)
Sodium: 136 mmol/L (ref 135–145)

## 2017-10-13 LAB — ECHOCARDIOGRAM COMPLETE
Height: 70 in
WEIGHTICAEL: 2928 [oz_av]

## 2017-10-13 LAB — MRSA PCR SCREENING: MRSA BY PCR: NEGATIVE

## 2017-10-13 LAB — TROPONIN I
TROPONIN I: 1.09 ng/mL — AB (ref <0.03)
TROPONIN I: 0.83 ng/mL — AB (ref <0.03)
Troponin I: 0.96 ng/mL (ref <0.03)

## 2017-10-13 MED ORDER — SALINE SPRAY 0.65 % NA SOLN: 1.0000 | Freq: Every day | NASAL | Status: DC | PRN

## 2017-10-13 MED ORDER — LEVOTHYROXINE SODIUM 50 MCG PO TABS
50.0000 ug | ORAL_TABLET | ORAL | Status: DC
  Administered 2017-10-14: 50 ug via ORAL

## 2017-10-13 MED ORDER — EZETIMIBE 10 MG PO TABS
10.0000 mg | ORAL_TABLET | Freq: Every day | ORAL | Status: DC
  Administered 2017-10-13 – 2017-10-15 (×3): 10 mg via ORAL
  Filled 2017-10-13 (×3): qty 1

## 2017-10-13 MED ORDER — IPRATROPIUM-ALBUTEROL 0.5-2.5 (3) MG/3ML IN SOLN
3.0000 mL | Freq: Four times a day (QID) | RESPIRATORY_TRACT | Status: DC
  Administered 2017-10-13 – 2017-10-15 (×8): 3 mL via RESPIRATORY_TRACT
  Filled 2017-10-13 (×8): qty 3

## 2017-10-13 MED ORDER — LEVOFLOXACIN IN D5W 250 MG/50ML IV SOLN: 250.0000 mg | INTRAVENOUS | Status: DC

## 2017-10-13 MED ORDER — LEVOTHYROXINE SODIUM 25 MCG PO TABS: 25.0000 ug | ORAL_TABLET | Freq: Every day | ORAL | Status: DC

## 2017-10-13 MED ORDER — CARVEDILOL 6.25 MG PO TABS
6.2500 mg | ORAL_TABLET | Freq: Two times a day (BID) | ORAL | Status: DC
  Administered 2017-10-13 – 2017-10-15 (×4): 6.25 mg via ORAL
  Filled 2017-10-13 (×4): qty 1

## 2017-10-13 MED ORDER — ASPIRIN EC 81 MG PO TBEC
81.0000 mg | DELAYED_RELEASE_TABLET | Freq: Every day | ORAL | Status: DC
  Administered 2017-10-13 – 2017-10-15 (×3): 81 mg via ORAL
  Filled 2017-10-13 (×4): qty 1

## 2017-10-13 MED ORDER — SODIUM CHLORIDE 0.9 % IV SOLN: 250.0000 mL | INTRAVENOUS | Status: DC | PRN

## 2017-10-13 MED ORDER — HYDROXYZINE HCL 25 MG PO TABS: 25.0000 mg | ORAL_TABLET | Freq: Every evening | ORAL | Status: DC | PRN

## 2017-10-13 MED ORDER — APIXABAN 2.5 MG PO TABS
2.5000 mg | ORAL_TABLET | Freq: Two times a day (BID) | ORAL | Status: DC
  Administered 2017-10-13 – 2017-10-15 (×5): 2.5 mg via ORAL
  Filled 2017-10-13 (×6): qty 1

## 2017-10-13 MED ORDER — FUROSEMIDE 10 MG/ML IJ SOLN
80.0000 mg | Freq: Two times a day (BID) | INTRAMUSCULAR | Status: DC
  Administered 2017-10-13: 80 mg via INTRAVENOUS
  Filled 2017-10-13: qty 8

## 2017-10-13 MED ORDER — SODIUM CHLORIDE 0.9% FLUSH
3.0000 mL | Freq: Two times a day (BID) | INTRAVENOUS | Status: DC
  Administered 2017-10-13 – 2017-10-15 (×5): 3 mL via INTRAVENOUS

## 2017-10-13 MED ORDER — ONDANSETRON HCL 4 MG/2ML IJ SOLN: 4.0000 mg | Freq: Four times a day (QID) | INTRAMUSCULAR | Status: DC | PRN

## 2017-10-13 MED ORDER — PANTOPRAZOLE SODIUM 40 MG PO TBEC
40.0000 mg | DELAYED_RELEASE_TABLET | Freq: Every day | ORAL | Status: DC
  Administered 2017-10-13 – 2017-10-15 (×3): 40 mg via ORAL
  Filled 2017-10-13 (×3): qty 1

## 2017-10-13 MED ORDER — POTASSIUM CHLORIDE CRYS ER 20 MEQ PO TBCR
40.0000 meq | EXTENDED_RELEASE_TABLET | Freq: Every day | ORAL | Status: DC
  Administered 2017-10-14 – 2017-10-15 (×2): 40 meq via ORAL
  Filled 2017-10-13: qty 2

## 2017-10-13 MED ORDER — ACETAMINOPHEN 325 MG PO TABS: 650.0000 mg | ORAL_TABLET | ORAL | Status: DC | PRN

## 2017-10-13 MED ORDER — POTASSIUM CHLORIDE CRYS ER 10 MEQ PO TBCR
10.0000 meq | EXTENDED_RELEASE_TABLET | Freq: Every day | ORAL | Status: DC
  Administered 2017-10-13: 10 meq via ORAL
  Filled 2017-10-13: qty 1

## 2017-10-13 MED ORDER — ACETAMINOPHEN 500 MG PO TABS: 500.0000 mg | ORAL_TABLET | Freq: Every evening | ORAL | Status: DC | PRN

## 2017-10-13 MED ORDER — SODIUM CHLORIDE 0.9% FLUSH: 3.0000 mL | INTRAVENOUS | Status: DC | PRN

## 2017-10-13 MED ORDER — AMIODARONE HCL 100 MG PO TABS
100.0000 mg | ORAL_TABLET | Freq: Every day | ORAL | Status: DC
  Administered 2017-10-13: 100 mg via ORAL
  Filled 2017-10-13 (×2): qty 1

## 2017-10-13 MED ORDER — FERROUS SULFATE 325 (65 FE) MG PO TABS
325.0000 mg | ORAL_TABLET | Freq: Every day | ORAL | Status: DC
  Administered 2017-10-13 – 2017-10-14 (×2): 325 mg via ORAL
  Filled 2017-10-13 (×2): qty 1

## 2017-10-13 MED ORDER — DIPHENHYDRAMINE HCL 25 MG PO CAPS: 25.0000 mg | ORAL_CAPSULE | Freq: Every evening | ORAL | Status: DC | PRN

## 2017-10-13 MED ORDER — FUROSEMIDE 10 MG/ML IJ SOLN: 60.0000 mg | Freq: Two times a day (BID) | INTRAMUSCULAR | Status: DC

## 2017-10-13 MED ORDER — TAMSULOSIN HCL 0.4 MG PO CAPS
0.4000 mg | ORAL_CAPSULE | Freq: Every day | ORAL | Status: DC
  Administered 2017-10-13 – 2017-10-14 (×2): 0.4 mg via ORAL
  Filled 2017-10-13 (×2): qty 1

## 2017-10-13 MED ORDER — LEVOFLOXACIN IN D5W 500 MG/100ML IV SOLN: 500.0000 mg | INTRAVENOUS | Status: DC

## 2017-10-13 MED ORDER — FUROSEMIDE 10 MG/ML IJ SOLN
40.0000 mg | Freq: Every day | INTRAMUSCULAR | Status: DC
  Administered 2017-10-13: 40 mg via INTRAVENOUS
  Filled 2017-10-13: qty 4

## 2017-10-13 MED ORDER — DIPHENHYDRAMINE-APAP (SLEEP) 25-500 MG PO TABS: 1.0000 | ORAL_TABLET | Freq: Every evening | ORAL | Status: DC | PRN

## 2017-10-13 MED ORDER — ROSUVASTATIN CALCIUM 10 MG PO TABS
20.0000 mg | ORAL_TABLET | Freq: Every day | ORAL | Status: DC
  Administered 2017-10-13 – 2017-10-14 (×2): 20 mg via ORAL
  Filled 2017-10-13: qty 1
  Filled 2017-10-13: qty 2

## 2017-10-13 MED ORDER — LEVOTHYROXINE SODIUM 25 MCG PO TABS
25.0000 ug | ORAL_TABLET | ORAL | Status: DC
  Administered 2017-10-13 – 2017-10-15 (×2): 25 ug via ORAL
  Filled 2017-10-13 (×2): qty 1

## 2017-10-13 MED ORDER — AZO BLADDER CONTROL/GO-LESS PO CAPS
1.0000 | ORAL_CAPSULE | Freq: Every day | ORAL | Status: DC | PRN
Start: 2017-10-13 — End: 2017-10-13

## 2017-10-13 NOTE — Care Management Note (Signed)
Case Management Note  Patient Details  Name: [name] MRN: [id number] Date of Birth: Apr 21, 1932  Subjective/Objective:                  83 y.o. male with medical history significant of CHF last EF 2017 35%, afib on eloquis (but ran out recently), CKD baseline cr 2, CAD, COPD, not on home oxygen comes in with several days of worsening sob and cough. Form home with spouse.  Action/Plan: Admit status INPATIENT (ACUTE RESPIRATORY FAILURE WITH HYPOXIA); anticipate discharge [place].   Expected Discharge Date:  (unknown)               Expected Discharge Plan:  [place]  In-House Referral:     Discharge planning Services  CM Consult  Status of Service:  In process, will continue to follow  If discussed at Long Length of Stay Meetings, dates discussed:    Additional Comments:  Fuller Mandril, RN 10/13/2017, 9:24 AM

## 2017-10-13 NOTE — ED Notes (Signed)
Attempted report 

## 2017-10-13 NOTE — Progress Notes (Signed)
PROGRESS NOTE    [name]  ZSW:[id number] DOB: [birth year] DOA: 10/12/2017 PCP: [name], MD  Brief Narrative: [name] is a 83 y.o. male with history of CAD/PCI, chronic systolic CHF EF 32% w/ICD, PAF on Eliquis, CKD 4,  HTN,  tremor, h/o bladder CA, admitted 12/30 night with acute on chronic systolic CHF/respiratory failure requiring BiPAP, now improving   Assessment & Plan:     Acute respiratory failure with hypoxia ([place]) -Due to acute on chronic systolic CHF' -Weaned off BiPAP, continue IV Lasix will increase dose -In addition to his lungs I suspect a lot of his fluid buildup is in his abdomen -Monitor I/O, daily weights -consult CHF team -Last echo with EF of 25% in 2017, will repeat -Monitor kidney function closely given CKD4, avoid hypotension  Acute on chronic systolic CHF/ischemic cardiomyopathy -Status post AICD -As above -Continue Coreg  Elevated troponin -Likely due to demand ischemia in the setting of hypoxemia/acute CHF, and decreased renal clearance due to CKD4, I do not think he has ACS at this time. -Check 2-D echocardiogram to evaluate wall motion -Continue aspirin, Coreg -Await cards input  Paroxysmal atrial fibrillation -chads2vasc score>3, continue Coreg, amiodarone and low dose apixaban -Unfortunately if his kidney function worsens down the road he may need to be transitioned to Coumadin  CKD4 -Followed by Dr. Pearson Grippe with [facility] -Baseline creatinine is 2.5-2.9 range -Now stable -Monitor with diuresis, avoid hypotension -stopped ARB  Hypertension -Stopped ARB, continue Coreg and diuretics  COPD -Stable, not on home oxygen -No wheezing noted, continue nebs PRN,  no indication for antibiotics or steroids at this time  DVT prophylaxis:eliquis Code Status: Full Code Family Communication: None at bedside Disposition Plan: home likely in 48 hours pending clinical improvement  Consultants:    Cardiology   Procedures:   Antimicrobials:    Subjective: -Feels better, breathing better reports progressive dyspnea on exertion for days to weeks and increased abdominal distention  Objective: Vitals:   10/13/17 0913 10/13/17 1008 10/13/17 1030 10/13/17 1107  BP:  122/67 (!) 120/58   Pulse:  74 73 66  Resp:  19 19 19   Temp:    98.2 F (36.8 C)  TempSrc:    Oral  SpO2: 96% 96% 96% 96%  Weight:    83 kg (183 lb)  Height:    5\' 10"  (1.778 m)    Intake/Output Summary (Last 24 hours) at 10/13/2017 1145 Last data filed at 10/13/2017 1031 Gross per 24 hour  Intake 653 ml  Output 700 ml  Net -47 ml   Filed Weights   10/12/17 1900 10/13/17 1107  Weight: 83 kg (183 lb) 83 kg (183 lb)    Examination:  General exam: Appears calm and comfortable  Respiratory system: Crackles at both bases  Cardiovascular system: S1 & S2 heard, RRR. + JVD  Gastrointestinal system: Abdomen is distended, soft and nontender.Normal bowel sounds heard, abdominal wall edema noted Central nervous system: Alert and oriented. No focal neurological deficits. Extremities: 1+ edema noted Skin: No rashes, lesions or ulcers Psychiatry: Judgement and insight appear normal. Mood & affect appropriate.     Data Reviewed:   CBC: Recent Labs  Lab 10/12/17 1847  WBC 15.7*  HGB 13.0  HCT 39.7  MCV 94.7  PLT 202   Basic Metabolic Panel: Recent Labs  Lab 10/12/17 1847 10/13/17 0156  NA 137 136  K 4.8 4.1  CL 103 104  CO2 20* 19*  GLUCOSE 152* 160*  BUN  23* 26*  CREATININE 2.51* 2.76*  CALCIUM 8.6* 8.1*   GFR: Estimated Creatinine Clearance: 20.9 mL/min (A) (by C-G formula based on SCr of 2.76 mg/dL (H)). Liver Function Tests: Recent Labs  Lab 10/12/17 1847  AST 46*  ALT 12*  ALKPHOS 71  BILITOT 1.6*  PROT 7.4  ALBUMIN 3.7   No results for input(s): LIPASE, AMYLASE in the last 168 hours. No results for input(s): AMMONIA in the last 168 hours. Coagulation Profile: No results  for input(s): INR, PROTIME in the last 168 hours. Cardiac Enzymes: Recent Labs  Lab 10/13/17 0156 10/13/17 0733  TROPONINI 0.96* 1.09*   BNP (last 3 results) No results for input(s): PROBNP in the last 8760 hours. HbA1C: No results for input(s): HGBA1C in the last 72 hours. CBG: No results for input(s): GLUCAP in the last 168 hours. Lipid Profile: No results for input(s): CHOL, HDL, LDLCALC, TRIG, CHOLHDL, LDLDIRECT in the last 72 hours. Thyroid Function Tests: No results for input(s): TSH, T4TOTAL, FREET4, T3FREE, THYROIDAB in the last 72 hours. Anemia Panel: No results for input(s): VITAMINB12, FOLATE, FERRITIN, TIBC, IRON, RETICCTPCT in the last 72 hours. Urine analysis:    Component Value Date/Time   COLORURINE YELLOW 10/12/2017 2123   APPEARANCEUR CLEAR 10/12/2017 2123   LABSPEC 1.013 10/12/2017 2123   PHURINE 5.0 10/12/2017 2123   GLUCOSEU NEGATIVE 10/12/2017 2123   HGBUR MODERATE (A) 10/12/2017 2123   BILIRUBINUR NEGATIVE 10/12/2017 2123   [place] NEGATIVE 10/12/2017 2123   PROTEINUR 30 (A) 10/12/2017 2123   UROBILINOGEN 0.2 08/15/2013 1132   NITRITE NEGATIVE 10/12/2017 2123   LEUKOCYTESUR NEGATIVE 10/12/2017 2123   Sepsis Labs: @LABRCNTIP (procalcitonin:4,lacticidven:4)  )No results found for this or any previous visit (from the past 240 hour(s)).       Radiology Studies: Dg Chest Portable 1 View  Result Date: 10/12/2017 CLINICAL DATA:  Short of breath EXAM: PORTABLE CHEST 1 VIEW COMPARISON:  09/11/2016 FINDINGS: Cardiac enlargement with vascular congestion. Probable mild interstitial edema. No effusion. Cardiac AICD unchanged. IMPRESSION: Pulmonary vascular congestion with mild interstitial edema. Electronically Signed   By: Franchot Gallo M.D.   On: 10/12/2017 20:19        Scheduled Meds: . amiodarone  100 mg Oral Daily  . apixaban  2.5 mg Oral BID  . aspirin EC  81 mg Oral Daily  . carvedilol  6.25 mg Oral BID WC  . ferrous sulfate  325 mg Oral  Q supper  . furosemide  40 mg Intravenous Daily  . ipratropium-albuterol  3 mL Nebulization Q6H  . levothyroxine  25 mcg Oral Once per day on Mon Wed Fri  . [START ON 10/14/2017] levothyroxine  50 mcg Oral Once per day on Sun Tue Thu Sat  . methylPREDNISolone (SOLU-MEDROL) injection  80 mg Intravenous Q12H  . pantoprazole  40 mg Oral Daily  . potassium chloride  10 mEq Oral Daily  . sodium chloride flush  3 mL Intravenous Q12H  . tamsulosin  0.4 mg Oral QHS   Continuous Infusions: . sodium chloride    . [START ON 10/14/2017] levofloxacin (LEVAQUIN) IV       LOS: 1 day    Time spent: 2min    Domenic Polite, MD [facility] Page via [email or web address], password [email or web address] After 7PM please contact night-coverage  10/13/2017, 11:45 AM

## 2017-10-13 NOTE — Progress Notes (Signed)
Pt taken off bipap and placed on 3L [place] at this time. Pt denies SOB, no increased WOB, VS within normal limits. RN aware. RT will continue to monitor.

## 2017-10-13 NOTE — ED Notes (Signed)
Pt reporting BiPAP mask hurting bridge of nose; mask adjusted for now

## 2017-10-13 NOTE — ED Notes (Signed)
Sent page to Floor coverage about critical lab

## 2017-10-13 NOTE — Progress Notes (Signed)
Pharmacy Antibiotic Note  [name] is a 83 y.o. male admitted on 10/12/2017 with COPD exacerbation.  Pharmacy has been consulted for Levaquin dosing.  Plan: Levaquin 750mg  IV given in ED; continue with Levaquin 250mg  IV Q48H.  Weight: 183 lb (83 kg)  Temp (24hrs), Avg:99.3 F (37.4 C), Min:99.3 F (37.4 C), Max:99.3 F (37.4 C)  Recent Labs  Lab 10/12/17 1847 10/12/17 2113  WBC 15.7*  --   CREATININE 2.51*  --   LATICACIDVEN  --  1.78    Estimated Creatinine Clearance: 23 mL/min (A) (by C-G formula based on SCr of 2.51 mg/dL (H)).    Allergies  Allergen Reactions  . Ramipril Other (See Comments)    cough  . Penicillins Itching and Rash    Has patient had a PCN reaction causing immediate rash, facial/tongue/throat swelling, SOB or lightheadedness with hypotension: No Has patient had a PCN reaction causing severe rash involving mucus membranes or skin necrosis: Yes-back and hands. Has patient had a PCN reaction that required hospitalization: No Has patient had a PCN reaction occurring within the last 10 years: Yes If all of the above answers are "NO", then may proceed with Cephalosporin use.     Thank you for allowing pharmacy to be a part of this patient's care.  Wynona Neat, PharmD, BCPS  10/13/2017 2:09 AM

## 2017-10-13 NOTE — ED Notes (Signed)
Floor coverage acknowledged Trop I of 0.96

## 2017-10-13 NOTE — ED Notes (Signed)
Pt on bipap currently. MD notified that patient was unable to take PO medications currently due to policy. Stated give Eliquis whenever patient is able. (Dr. Shanon Brow).

## 2017-10-13 NOTE — ED Notes (Signed)
Hospital bed ordered for patient.

## 2017-10-13 NOTE — Progress Notes (Signed)
2D Echocardiogram has been performed.  [name] 10/13/2017, 3:48 PM

## 2017-10-13 NOTE — ED Notes (Signed)
Called pt's wife [name] to notify her of pt's room assignment.   Her phone number [phone]

## 2017-10-13 NOTE — Progress Notes (Signed)
Pharmacy Antibiotic Note  [name] is a 83 y.o. male admitted on 10/12/2017 with CAP.  Pharmacy has been consulted for Levaquin dosing. -WBC= 15, afebrile, SCr= 2/76, CrCl ~ 20  Plan: -Change levaquin to 500mg  IV q48hr -Will follow renal function, cultures and clinical progress   Height: 5\' 10"  (177.8 cm) Weight: 183 lb (83 kg) IBW/kg (Calculated) : 73  Temp (24hrs), Avg:98.8 F (37.1 C), Min:98.2 F (36.8 C), Max:99.3 F (37.4 C)  Recent Labs  Lab 10/12/17 1847 10/12/17 2113 10/13/17 0156  WBC 15.7*  --   --   CREATININE 2.51*  --  2.76*  LATICACIDVEN  --  1.78  --     Estimated Creatinine Clearance: 20.9 mL/min (A) (by C-G formula based on SCr of 2.76 mg/dL (H)).    Allergies  Allergen Reactions  . Ramipril Other (See Comments)    cough  . Penicillins Itching and Rash    Has patient had a PCN reaction causing immediate rash, facial/tongue/throat swelling, SOB or lightheadedness with hypotension: No Has patient had a PCN reaction causing severe rash involving mucus membranes or skin necrosis: Yes-back and hands. Has patient had a PCN reaction that required hospitalization: No Has patient had a PCN reaction occurring within the last 10 years: Yes If all of the above answers are "NO", then may proceed with Cephalosporin use.     Thank you for allowing pharmacy to be a part of this patient's care.  Hildred Laser, Pharm D 10/13/2017 11:46 AM

## 2017-10-13 NOTE — Consult Note (Signed)
Advanced Heart Failure Team Consult Note  Primary Cardiologist:  Dr. Haroldine Laws   Reason for Consultation: A/C systolic CHF/Resp failure  HPI:    [name] is seen today for evaluation of A/C systolic CHF at the request of Dr. Broadus John.   [name] is a 83 y.o. male with history of CAD/PCI, chronic systolic CHF EF 17%P/ZWC, PAF on Eliquis, CKD 4,  HTN,  tremor, h/o bladder CA, admitted 12/30 night with acute on chronic systolic CHF/respiratory failure requiring BiPAP, now improving  Last seen in HF clinic 12/12/2016. Was stable on lasix 20 mg daily. Baseline weight established as 181-183 at home.  Admitted 10/12/17 with worsening SOB and cough. Pt d/o recent URI.  Was sawing wood "several days ago" and "inhaled a bunch of stuff". Denies fevers or chills. Some nasal congestion. Started on levaquin by his PCP, but only took 1 dose PTA.  Called EMS with acute SOB at home and started on CPAP due to hypoxia. Required BiPAP in ED and given IV lasix. Pertinent labs on admission include Creatinine 2.51, K 4.8, BNP 626.2, Troponin peak at 1.09, and WBC 15.7. CXR with pulmonary vascular congestion and mild interstitial edema.   Remains fatigued but breathing much improved. Remains on O2 via [place] (Not on O2 at home).  Was USOH up until this weekend. Usually has no SOB with ADLs or doing odd jobs around the house (recently replaced a portion of his wood stove pipe).  This weekend had gradually worsening SOB with mild productive cough. Chest felt "tight" with SOB, but no overt chest pain. He spoke with his PCP who prescribed him Levaquin and "some little pill I was supposed to take every 3 hrs".  He denies recent sick contacts. Denies peripheral edema.  SOB yesterday walking 40-50 feet in the house, but no SOB with getting dressed.  He became acutely worse in the evening with SOB at rest as above, prompting EMS and his admission.   Echo 10/29/15 showed LVEF 25-30%, grade 1 DD, mild LAE. Repeat  pending today.   Review of Systems: [y] = yes, [ ]  = no   General: Weight gain [ ] ; Weight loss [ ] ; Anorexia [ ] ; Fatigue [ ] ; Fever [ ] ; Chills [ ] ; Weakness [y]  Cardiac: Chest pain/pressure [ ] ; Resting SOB [y]; Exertional SOB [y]; Orthopnea [ ] ; Pedal Edema [ ] ; Palpitations [ ] ; Syncope [ ] ; Presyncope [ ] ; Paroxysmal nocturnal dyspnea[ ]   Pulmonary: Cough [y]; Wheezing[ ] ; Hemoptysis[ ] ; Sputum [y]; Snoring [ ]   GI: Vomiting[ ] ; Dysphagia[ ] ; Melena[ ] ; Hematochezia [ ] ; Heartburn[ ] ; Abdominal pain [ ] ; Constipation [ ] ; Diarrhea [ ] ; BRBPR [ ]   GU: Hematuria[ ] ; Dysuria [ ] ; Nocturia[ ]   Vascular: Pain in legs with walking [ ] ; Pain in feet with lying flat [ ] ; Non-healing sores [ ] ; Stroke [ ] ; TIA [ ] ; Slurred speech [ ] ;  Neuro: Headaches[ ] ; Vertigo[ ] ; Seizures[ ] ; Paresthesias[ ] ;Blurred vision [ ] ; Diplopia [ ] ; Vision changes [ ]   Ortho/Skin: Arthritis [y]; Joint pain [y]; Muscle pain [ ] ; Joint swelling [ ] ; Back Pain [ ] ; Rash [ ]   Psych: Depression[ ] ; Anxiety[ ]   Heme: Bleeding problems [ ] ; Clotting disorders [ ] ; Anemia [ ]   Endocrine: Diabetes [ ] ; Thyroid dysfunction[ ]   Home Medications Prior to Admission medications   Medication Sig Start Date End Date Taking? Authorizing Provider  acetaminophen (TYLENOL) 325 MG tablet Take 2 tablets (650 mg  total) by mouth every 6 (six) hours as needed for mild pain (or Fever >/= 101). Patient taking differently: Take 1,000 mg by mouth every 6 (six) hours as needed for mild pain or headache.  08/18/13  Yes [name], MD  amiodarone (PACERONE) 100 MG tablet Take 1 tablet (100 mg total) by mouth daily. 03/06/17  Yes Larey Dresser, MD  apixaban (ELIQUIS) 2.5 MG TABS tablet Take 1 tablet (2.5 mg total) by mouth 2 (two) times daily. Needs office visit Patient taking differently: Take 2.5 mg by mouth 2 (two) times daily.  06/30/17  Yes Bensimhon, Shaune Pascal, MD  carvedilol (COREG) 6.25 MG tablet Take 1 tablet (6.25 mg total) by mouth 2  (two) times daily with a meal. NEEDS FOLLOW UP 06/2017 Patient taking differently: Take 6.25 mg by mouth 2 (two) times daily with a meal.  06/24/17  Yes Larey Dresser, MD  diphenhydramine-acetaminophen (TYLENOL PM) 25-500 MG TABS tablet Take 1 tablet by mouth at bedtime as needed (sleep).   Yes [provider]  ezetimibe (ZETIA) 10 MG tablet TAKE ONE TABLET BY MOUTH ONCE DAILY WITH SUPPER Patient taking differently: TAKE ONE TABLET (10mg ) BY MOUTH ONCE DAILY WITH SUPPER 03/06/17  Yes Larey Dresser, MD  ferrous sulfate ([employer] IRON) 325 (65 FE) MG tablet Take 325 mg by mouth daily with supper.    Yes [provider]  furosemide (LASIX) 20 MG tablet Take 20 mg by mouth daily.  08/21/13  Yes [provider]  hydrOXYzine (ATARAX/VISTARIL) 25 MG tablet Take 1 tablet by mouth at bedtime as needed for itching (legs.). Reported on 10/19/2015 11/16/13  Yes [provider]  KLOR-CON M10 10 MEQ tablet Take 10 mEq by mouth daily.  06/26/13  Yes [provider]  levothyroxine (SYNTHROID, LEVOTHROID) 50 MCG tablet Take 25-50 mcg by mouth daily before breakfast. Take 50 mcg (1 tablet) on Tues, Thurs, Sat, Sun and 25 mcg (1/2 tablet) on Mon, Wed, Fri   Yes [provider]  losartan (COZAAR) 50 MG tablet Take 0.5 tablets (25 mg total) by mouth daily. Patient taking differently: Take 25 mg by mouth See admin instructions. Daily if SBP is > 100 05/09/17 10/12/17 Yes Baldwin Jamaica, PA-C  Magnesium Hydroxide (MILK OF MAGNESIA PO) Take 20 mLs by mouth daily as needed (for constipation).   Yes [provider]  Multiple Vitamins-Minerals (CENTRUM SILVER PO) Take 1 tablet by mouth at bedtime.    Yes [provider]  nitroGLYCERIN (NITROSTAT) 0.4 MG SL tablet DISSOLVE ONE TABLET UNDER THE TONGUE EVERY 5 MINUTES AS NEEDED FOR CHEST PAIN.  DO NOT EXCEED A TOTAL OF 3 DOSES IN 15 MINUTES 12/26/16  Yes Larey Dresser, MD  pantoprazole (PROTONIX) 40 MG tablet  Take 40 mg by mouth daily.  01/15/11  Yes [provider]  Pumpkin Seed-Soy Germ (AZO BLADDER CONTROL/GO-LESS) CAPS Take 1 capsule by mouth daily as needed (for bladder incontinence).    Yes [provider]  rosuvastatin (CRESTOR) 20 MG tablet Take 1 tablet (20 mg total) by mouth daily. Patient taking differently: Take 20 mg by mouth at bedtime.  10/18/15  Yes Bensimhon, Shaune Pascal, MD  sodium chloride (OCEAN) 0.65 % SOLN nasal spray Place 1-2 sprays into both nostrils daily as needed for congestion.    Yes [provider]  tamsulosin (FLOMAX) 0.4 MG CAPS capsule Take 0.4 mg by mouth at bedtime.  11/23/13  Yes [provider]  acetaminophen (TYLENOL) 500 MG tablet Take  1,000 mg by mouth every 6 (six) hours as needed for moderate pain.    [provider]  lidocaine (LIDODERM) 5 % Place 1 patch onto the skin daily. Remove & Discard patch within 12 hours or as directed by MD Patient not taking: Reported on 10/12/2017 07/28/17   Long, Wonda Olds, MD    Past Medical History: Past Medical History:  Diagnosis Date  . Acute bronchitis with COPD ([place]) 09/10/2016  . Anemia   . Atrial fibrillation or flutter    maintaining sinus on amiodarone    . Bladder cancer ([place]) dx'd 05/2012  . BPH (benign prostatic hyperplasia)   . CAD (coronary artery disease)     a. s/p anterior MI 12/05 c/b shock -> stent LAD   b. s/p stenting OM-1, 2/06  . CHF (congestive heart failure) ([facility])    due to ischemic CM  a. EF 20-30%. (Nov 2008)   b. s/p St. Jude BiV-ICD    c. CPX 07/2008  pvo2 16.3 (63% predicted) slope 34 RER 1.08 O2 pulse 93%  . Cough    occasional /productive, no fever/ not new  . CRI (chronic renal insufficiency)    (baseline 2.0-2.2)/ recent hospitalization 8/13  . GERD (gastroesophageal reflux disease)    hx Barretts esophagitis  . History of blood transfusion    following coumadin  usage  . HTN (hypertension)    EKG 05/14/12,Chest x ray 8/13, last ICD interrogation  8/13 EPIC  . Hyperlipidemia   . Hypothyroidism   . 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 Skin cancer    basal cells   facial x 4, 1 right forearm  . Sleep apnea    STOP BANG SCORE 4    Past Surgical History: Past Surgical History:  Procedure Laterality Date  . [place]   lower back  . CARDIAC DEFIBRILLATOR PLACEMENT     ICD St Jude; gen change 09-20-13  . CERVICAL LAMINECTOMY  1985  . COLONOSCOPY    . CORONARY ANGIOPLASTY  2005,2006  . CYSTOSCOPY W/ RETROGRADES  05/25/2012   Procedure: CYSTOSCOPY WITH RETROGRADE PYELOGRAM;  Surgeon: Molli Hazard, MD;  Location: [facility];  Service: Urology;  Laterality: Bilateral;  . CYSTOSCOPY W/ URETERAL STENT PLACEMENT  07/08/2012   Procedure: CYSTOSCOPY WITH STENT REPLACEMENT;  Surgeon: Molli Hazard, MD;  Location: [facility];  Service: Urology;  Laterality: Left;  . CYSTOSCOPY W/ URETERAL STENT REMOVAL  07/08/2012   Procedure: CYSTOSCOPY WITH STENT REMOVAL;  Surgeon: Molli Hazard, MD;  Location: [facility];  Service: Urology;  Laterality: Bilateral;  . CYSTOSCOPY/RETROGRADE/URETEROSCOPY  07/08/2012   Procedure: CYSTOSCOPY/RETROGRADE/URETEROSCOPY;  Surgeon: Molli Hazard, MD;  Location: [facility];  Service: Urology;  Laterality: Left;  . ESOPHAGOGASTRODUODENOSCOPY    . HERNIA REPAIR  1989   bilateral  . IMPLANTABLE CARDIOVERTER DEFIBRILLATOR (ICD) GENERATOR CHANGE N/A 09/20/2013   Procedure: ICD GENERATOR CHANGE;  Surgeon: Deboraha Sprang, MD;  Location: [facility] CATH [facility];  Service: Cardiovascular;  Laterality: N/A;  . TRANSURETHRAL RESECTION OF BLADDER TUMOR  05/25/2012   cold cup biopsy prostate  . TRANSURETHRAL RESECTION OF BLADDER TUMOR  07/08/2012   Procedure: TRANSURETHRAL RESECTION OF BLADDER TUMOR (TURBT);  Surgeon: Molli Hazard, MD;  Location: [facility];  Service: Urology;  Laterality:  N/A;  CYSTO, TURBT W/ GYRUS, BILATERAL STENT REMOVAL, LEFT URETEROSCOPY WITH BIOPSY, POSSIBLE  LEFT STENT PLACEMENT    Family History: Family History  Problem Relation Age of Onset  . Coronary artery disease Unknown   . Stroke Unknown    Social History: Social History   Socioeconomic History  . Marital status: Married    Spouse name: None  . Number of children: None  . Years of education: None  . Highest education level: None  Social Needs  . Financial resource strain: None  . Food insecurity - worry: None  . Food insecurity - inability: None  . Transportation needs - medical: None  . Transportation needs - non-medical: None  Occupational History  . Occupation: retired  Tobacco Use  . Smoking status: Former Smoker    Types: Cigarettes, Cigars    Last attempt to quit: 03/02/2004    Years since quitting: 13.6  . Smokeless tobacco: Former Systems developer    Quit date: 05/20/1999  . Tobacco comment: quit in 2005  Substance and Sexual Activity  . Alcohol use: No  . Drug use: No  . Sexual activity: None  Other Topics Concern  . None  Social History Narrative  . None   Allergies:  Allergies  Allergen Reactions  . Ramipril Other (See Comments)    cough  . Penicillins Itching and Rash    Has patient had a PCN reaction causing immediate rash, facial/tongue/throat swelling, SOB or lightheadedness with hypotension: No Has patient had a PCN reaction causing severe rash involving mucus membranes or skin necrosis: Yes-back and hands. Has patient had a PCN reaction that required hospitalization: No Has patient had a PCN reaction occurring within the last 10 years: Yes If all of the above answers are "NO", then may proceed with Cephalosporin use.   Objective:    Vital Signs:   Temp:  [98.2 F (36.8 C)-99.3 F (37.4 C)] 98.2 F (36.8 C) (12/31 1107) Pulse Rate:  [49-112] 66 (12/31 1107) Resp:  [11-37] 19 (12/31 1107) BP: (106-159)/(50-94) 120/58 (12/31 1030) SpO2:  [88 %-100 %] 96 %  (12/31 1107) FiO2 (%):  [60 %] 60 % (12/30 2037) Weight:  [183 lb (83 kg)] 183 lb (83 kg) (12/31 1107) Last BM Date: 10/12/17  Weight change: Filed Weights   10/12/17 1900 10/13/17 1107  Weight: 183 lb (83 kg) 183 lb (83 kg)    Intake/Output:   Intake/Output Summary (Last 24 hours) at 10/13/2017 1207 Last data filed at 10/13/2017 1031 Gross per 24 hour  Intake 653 ml  Output 700 ml  Net -47 ml    Physical Exam    General:  Elderly appearing.  HEENT: normal Neck: supple. JVP to jaw. Carotids 2+ bilat; no bruits. No lymphadenopathy or thyromegaly appreciated. Cor: PMI nondisplaced. Regular rate & rhythm. 2/6 SEM murmur at apex. Stable ICD site.  Lungs: Diminished throughout with scattered rhonchi and basilar crackles.  Abdomen: soft, distended, non-tender, no hepatosplenomegaly. No bruits or masses. Good bowel sounds. Extremities: no cyanosis, clubbing, or rash. Trace ankle edema. Neuro: alert & orientedx3, cranial nerves grossly intact. moves all 4 extremities w/o difficulty. Affect pleasant   Telemetry   V paced 60-70s, personally reviewed  EKG    Sinus tach 107 bpm 10/13/17, personally reviewed  Labs   Basic Metabolic Panel: Recent Labs  Lab 10/12/17 1847 10/13/17 0156  NA 137 136  K 4.8 4.1  CL 103 104  CO2 20* 19*  GLUCOSE 152* 160*  BUN 23* 26*  CREATININE 2.51* 2.76*  CALCIUM 8.6* 8.1*   Liver Function Tests: Recent Labs  Lab  10/12/17 1847  AST 46*  ALT 12*  ALKPHOS 71  BILITOT 1.6*  PROT 7.4  ALBUMIN 3.7   No results for input(s): LIPASE, AMYLASE in the last 168 hours. No results for input(s): AMMONIA in the last 168 hours.  CBC: Recent Labs  Lab 10/12/17 1847  WBC 15.7*  HGB 13.0  HCT 39.7  MCV 94.7  PLT 198   Cardiac Enzymes: Recent Labs  Lab 10/13/17 0156 10/13/17 0733  TROPONINI 0.96* 1.09*   BNP: BNP (last 3 results) Recent Labs    10/12/17 1847  BNP 626.2*   ProBNP (last 3 results) No results for input(s):  PROBNP in the last 8760 hours.  CBG: No results for input(s): GLUCAP in the last 168 hours.  Coagulation Studies: No results for input(s): LABPROT, INR in the last 72 hours.  Imaging   Dg Chest Portable 1 View  Result Date: 10/12/2017 CLINICAL DATA:  Short of breath EXAM: PORTABLE CHEST 1 VIEW COMPARISON:  09/11/2016 FINDINGS: Cardiac enlargement with vascular congestion. Probable mild interstitial edema. No effusion. Cardiac AICD unchanged. IMPRESSION: Pulmonary vascular congestion with mild interstitial edema. Electronically Signed   By: Franchot Gallo M.D.   On: 10/12/2017 20:19     Medications:    Current Medications: . amiodarone  100 mg Oral Daily  . apixaban  2.5 mg Oral BID  . aspirin EC  81 mg Oral Daily  . carvedilol  6.25 mg Oral BID WC  . ferrous sulfate  325 mg Oral Q supper  . furosemide  60 mg Intravenous Q12H  . ipratropium-albuterol  3 mL Nebulization Q6H  . levothyroxine  25 mcg Oral Once per day on Mon Wed Fri  . [START ON 10/14/2017] levothyroxine  50 mcg Oral Once per day on Sun Tue Thu Sat  . pantoprazole  40 mg Oral Daily  . [START ON 10/14/2017] potassium chloride  40 mEq Oral Daily  . sodium chloride flush  3 mL Intravenous Q12H  . tamsulosin  0.4 mg Oral QHS     Infusions: . sodium chloride       Patient Profile   [name] a 83 y.o.malewith history of CAD/PCI, chronic systolic CHF EF 81%E/HUD, PAF on Eliquis, CKD 4,  HTN,  tremor, and h/o bladder CA  Admitted 12/30 with acute on chronic systolic CHF/respiratory failure requiring BiPAP  Assessment/Plan   1. Acute respiratory failure - Improved with BiPAP and diuresis - Now on O2 via [place].  - On Levaquin for CAP. - WBC up to 15.7. Afebrile.   2. Acute on chronic systolic CHF due to ICM - Last echo 10/2015 LVEF 20-25%. Repeat pending - Volume status elevated on exam.  - Clear urine on lasix 60 mg IV BID. Continue for now.  - BP stable. Resume home meds as tolerated.  - Resume  coreg 6.25 mg BID - Hold valsartan for now.   3. CAD - Chest tightness in the setting of URI and acute respiratory distress. - Troponin up to 1.09.  Repeat pending. Likely in setting of HF/AECOPD, but will continue to follow.  - Not ideal candidate for cath with CKD IV.  - Off ASA due to Eliquis. - Pt had anterior MI 12/05 c/b shock -> stent to LAD. S/p stent of OM-1 11/2004.  Does not appear to have had ischemic work up since then.   4. PAF - Sinus tach 107. Will have St Jude check device to see if Afib burden increased and contributing.  - Continue Eliquis  2.5 mg BID.   5. CKD stage IV - Baseline creatinine near 2.8 - Sees Dr. Joelyn Oms.  Follow closely with diuresis.   6. Essential tremors - Neuro work up negative for [employer].   Medication concerns reviewed with patient and pharmacy team. Barriers identified: None at this time.   Length of Stay: 1  [name]  10/13/2017, 12:07 PM  Advanced Heart Failure Team Pager [phone] (M-F; 7a - 4p)  Please contact [facility] for night-coverage after hours (4p -7a ) and weekends on [email or web address]  Patient seen with PA, agree with the above note.  I performed an independent history and exam and adjusted the above note to reflect my thoughts.    Patient was admitted with increased exertional dyspnea.  He has not had any ischemic-type chest pain, has some tightness with deep breath but clearly pleuritic. Troponin elevated to around 1 without trend.  He is in NSR with BiV pacing.  Device check shows no recent atrial fibrillation.  Creatinine today is 2.8 which is near his baseline.  On exam, he remains volume overloaded with elevated JVP.  He seems to be diuresing reasonably well so far.  1. Acute on chronic systolic CHF: Ischemic cardiomyopathy.  Last echo in 1/17 with EF 20-25%.  On exam, he is volume overloaded still with NYHA class IV symptoms at admission.  He is feeling better with diuresis.  - Lasix 80 mg IV bid,  follow creatinine closely.  Will probably need a couple of days of diuresis.  - Hold off on valsartan for now with elevated creatinine and active diuresis, will try to start back before discharge.  - Continue home Coreg.  2. CAD: Pleuritic chest pain but no ischemic-type pain.  Troponin elevated to 1 without trend.  Possible demand ischemia with CKD IV + volume overload.   - Restart statin and Zetia.  - No ASA given Eliquis use.  - Would hold off on cardiac cath given lack of definitive ACS and CKD 4 with creatinine 2.8.  3. Atrial fibrillation: Paroxysmal.  He is on Eliquis 2.5 mg bid.  He has not had recent atrial fibrillation according to device interrogation today.  4. CKD: Stage IV.  He appears to be near his baseline currently.   [name] 10/13/2017 5:14 PM

## 2017-10-14 DIAGNOSIS — I480 Paroxysmal atrial fibrillation: Secondary | ICD-10-CM

## 2017-10-14 LAB — BASIC METABOLIC PANEL
Anion gap: 11 (ref 5–15)
BUN: 43 mg/dL — ABNORMAL HIGH (ref 6–20)
CALCIUM: 8.7 mg/dL — AB (ref 8.9–10.3)
CO2: 24 mmol/L (ref 22–32)
Chloride: 105 mmol/L (ref 101–111)
Creatinine, Ser: 3.11 mg/dL — ABNORMAL HIGH (ref 0.61–1.24)
GFR, EST AFRICAN AMERICAN: 20 mL/min — AB (ref >60)
GFR, EST NON AFRICAN AMERICAN: 17 mL/min — AB (ref >60)
Glucose, Bld: 134 mg/dL — ABNORMAL HIGH (ref 65–99)
Potassium: 3.7 mmol/L (ref 3.5–5.1)
Sodium: 140 mmol/L (ref 135–145)

## 2017-10-14 LAB — CBC
HCT: 35.3 % — ABNORMAL LOW (ref 39.0–52.0)
Hemoglobin: 11.4 g/dL — ABNORMAL LOW (ref 13.0–17.0)
MCH: 30.1 pg (ref 26.0–34.0)
MCHC: 32.3 g/dL (ref 30.0–36.0)
MCV: 93.1 fL (ref 78.0–100.0)
Platelets: 173 10*3/uL (ref 150–400)
RBC: 3.79 MIL/uL — AB (ref 4.22–5.81)
RDW: 14.3 % (ref 11.5–15.5)
WBC: 16.9 10*3/uL — ABNORMAL HIGH (ref 4.0–10.5)

## 2017-10-14 MED ORDER — AMIODARONE HCL 200 MG PO TABS
200.0000 mg | ORAL_TABLET | Freq: Two times a day (BID) | ORAL | Status: DC
  Administered 2017-10-14 – 2017-10-15 (×3): 200 mg via ORAL
  Filled 2017-10-14 (×3): qty 1

## 2017-10-14 MED ORDER — FUROSEMIDE 40 MG PO TABS
40.0000 mg | ORAL_TABLET | Freq: Every day | ORAL | Status: DC
  Administered 2017-10-14 – 2017-10-15 (×2): 40 mg via ORAL
  Filled 2017-10-14 (×2): qty 1

## 2017-10-14 MED ORDER — FUROSEMIDE 10 MG/ML IJ SOLN: 80.0000 mg | Freq: Two times a day (BID) | INTRAMUSCULAR | Status: DC

## 2017-10-14 NOTE — Evaluation (Signed)
Physical Therapy Evaluation Patient Details Name: [name] MRN: [id number] DOB: October 21, 1931 Today's Date: 10/14/2017   History of Present Illness   84 y.o. male with history of CAD/PCI, chronic systolic CHF EF 08% w/ICD, PAF on Eliquis, CKD 4,  HTN,  tremor, h/o bladder CA, admitted 12/30 with acute on chronic systolic CHF/respiratory failure   Clinical Impression  Pt very pleasant and moving well although noted balance deficit and decreased strength without use of RW with initial transfer. Pt normally independent and living with wife (also currently in hospital). Pt with decreased activity tolerance, balance and function who will benefit from acute therapy to maximize mobility, balance, gait and function to decrease burden of care and fall risk.  HR 107-130 throughout gait with nonsustained rate. SpO2 88-92% on RA with cues for breathing technique     Follow Up Recommendations Home health PT    Equipment Recommendations  None recommended by PT    Recommendations for Other Services       Precautions / Restrictions Precautions Precautions: Fall      Mobility  Bed Mobility Overal bed mobility: Modified Independent                Transfers Overall transfer level: Needs assistance   Transfers: Sit to/from Stand Sit to Stand: Min assist         General transfer comment: pt with initial posterior LOB and partial knee buckling on standing with min assist for balance. repeated trial minguard  Ambulation/Gait Ambulation/Gait assistance: Min guard Ambulation Distance (Feet): 400 Feet Assistive device: Rolling walker (2 wheeled) Gait Pattern/deviations: Step-through pattern;Decreased stride length;Trunk flexed   Gait velocity interpretation: Below normal speed for age/gender General Gait Details: cues for posture and position in RW with good stability with use of RW and unable to safely ambulate without at this time  Stairs            Wheelchair Mobility    Modified Rankin (Stroke Patients Only)       Balance Overall balance assessment: Needs assistance   Sitting balance-Leahy Scale: Good       Standing balance-Leahy Scale: Poor                               Pertinent Vitals/Pain Pain Assessment: No/denies pain    Home Living Family/patient expects to be discharged to:: Private residence Living Arrangements: Spouse/significant other Available Help at Discharge: Family;Available 24 hours/day Type of Home: House Home Access: Stairs to enter   [employer] of Steps: 4 Home Layout: Two level;Laundry or work area in [place]: Environmental consultant - 2 wheels;Cane - quad;Shower seat;Bedside commode      Prior Function Level of Independence: Independent         Comments: pt reports 2 falls in the last year     Hand Dominance        Extremity/Trunk Assessment   Upper Extremity Assessment Upper Extremity Assessment: Overall WFL for tasks assessed    Lower Extremity Assessment Lower Extremity Assessment: Overall WFL for tasks assessed    Cervical / Trunk Assessment Cervical / Trunk Assessment: Normal  Communication   Communication: No difficulties  Cognition Arousal/Alertness: Awake/alert Behavior During Therapy: WFL for tasks assessed/performed Overall Cognitive Status: Within Functional Limits for tasks assessed  General Comments      Exercises     Assessment/Plan    PT Assessment Patient needs continued PT services  PT Problem List Decreased mobility;Decreased activity tolerance;Decreased balance;Decreased knowledge of use of DME;Cardiopulmonary status limiting activity       PT Treatment Interventions DME instruction;Therapeutic activities;Gait training;Therapeutic exercise;Patient/family education;Stair training;Functional mobility training    PT Goals (Current goals can be found in the Care Plan section)  Acute Rehab  PT Goals Patient Stated Goal: return home PT Goal Formulation: With patient Time For Goal Achievement: 10/28/17 Potential to Achieve Goals: Good    Frequency Min 3X/week   Barriers to discharge Decreased caregiver support      Co-evaluation               AM-PAC PT "6 Clicks" Daily Activity  Outcome Measure Difficulty turning over in bed (including adjusting bedclothes, sheets and blankets)?: A Little Difficulty moving from lying on back to sitting on the side of the bed? : A Little Difficulty sitting down on and standing up from a chair with arms (e.g., wheelchair, bedside commode, etc,.)?: A Lot Help needed moving to and from a bed to chair (including a wheelchair)?: A Little Help needed walking in hospital room?: A Little Help needed climbing 3-5 steps with a railing? : A Little 6 Click Score: 17    End of Session Equipment Utilized During Treatment: Gait belt Activity Tolerance: Patient tolerated treatment well Patient left: in chair;Other (comment)(in transport chair with RN to visit wife) Nurse Communication: Mobility status PT Visit Diagnosis: Other abnormalities of gait and mobility (R26.89);History of falling (Z91.81)    Time: 3361-2244 PT Time Calculation (min) (ACUTE ONLY): 17 min   Charges:   PT Evaluation $PT Eval Moderate Complexity: 1 Mod     PT G Codes:        Elwyn Reach, PT [phone]   Magally Vahle B Moranda Billiot 10/14/2017, 1:33 PM

## 2017-10-14 NOTE — Progress Notes (Addendum)
PROGRESS NOTE    [name]  XFG:[id number] DOB: 02-25-1932 DOA: 10/12/2017 PCP: [name], MD  Brief Narrative: [name] is a 84 y.o. male with history of CAD/PCI, chronic systolic CHF EF 96% w/ICD, PAF on Eliquis, CKD 4,  HTN,  tremor, h/o bladder CA, admitted 12/30 night with acute on chronic systolic CHF/respiratory failure requiring BiPAP, now improving   Assessment & Plan:     Acute respiratory failure with hypoxia ([place]) -Due to acute on chronic systolic CHF' -Weaned off BiPAP -Diuresed well with IV Lasix, -2.1 L yesterday -Monitor I/O, daily weights -Bump in creatinine noted and appears close to euvolemic hence transitioned to oral Lasix -Last echo with EF of 25% in 2017, will repeat -Monitor kidney function closely given CKD4, avoid hypotension  Acute on chronic systolic CHF/ischemic cardiomyopathy -Status post AICD -As above -Continue Coreg  Elevated troponin -Likely due to demand ischemia in the setting of hypoxemia/acute CHF, and decreased renal clearance due to CKD4, I do not think he has ACS at this time. -Echo with EF of 20% and akinesis noted -Continue aspirin, Coreg Cards following  Paroxysmal atrial fibrillation -chads2vasc score>3, continue Coreg, amiodarone and low dose apixaban -He is likely borderline with being safe on apixaban at this time -In A. fib now, amiodarone dose increased per cards  CKD4 -Followed by Dr. Pearson Grippe with [facility] -Baseline creatinine is 2.5-2.9 range -Slight bump with diuresis noted, holding Lasix, stopped ARB on admission - avoid hypotension  Hypertension -Stopped ARB, continue Coreg and diuretics  COPD -Stable, not on home oxygen -No wheezing noted, continue nebs PRN,  no indication for antibiotics or steroids at this time  DVT prophylaxis:eliquis Code Status: Full Code Family Communication: None at bedside Disposition Plan: home likely in 48 hours pending clinical  improvement  Consultants:   Cardiology    Procedures:   Antimicrobials:    Subjective: -Breathing much better, no chest pain, occasional cough  Objective: Vitals:   10/14/17 0507 10/14/17 0727 10/14/17 0738 10/14/17 0900  BP: 90/62 95/68    Pulse: 80 86  (!) 115  Resp: (!) 21 18  18   Temp: 98.2 F (36.8 C) 98.2 F (36.8 C)    TempSrc: Oral Oral    SpO2: 94% 92% 93% 93%  Weight: 82.9 kg (182 lb 12.2 oz)     Height:        Intake/Output Summary (Last 24 hours) at 10/14/2017 0959 Last data filed at 10/14/2017 0729 Gross per 24 hour  Intake 263 ml  Output 2300 ml  Net -2037 ml   Filed Weights   10/12/17 1900 10/13/17 1107 10/14/17 0507  Weight: 83 kg (183 lb) 83 kg (183 lb) 82.9 kg (182 lb 12.2 oz)    Examination:  Gen: Awake, Alert, Oriented X 3,  HEENT: No JVD Lungs: Diminished on the basis, rest clear  CVS: RRR,No Gallops,Rubs or new Murmurs Abd: soft, Non tender, non distended, BS present Extremities: No Cyanosis, Clubbing or edema Skin: no new rashes Psychiatry: Judgement and insight appear normal. Mood & affect appropriate.     Data Reviewed:   CBC: Recent Labs  Lab 10/12/17 1847 10/14/17 0241  WBC 15.7* 16.9*  HGB 13.0 11.4*  HCT 39.7 35.3*  MCV 94.7 93.1  PLT 198 789   Basic Metabolic Panel: Recent Labs  Lab 10/12/17 1847 10/13/17 0156 10/14/17 0241  NA 137 136 140  K 4.8 4.1 3.7  CL 103 104 105  CO2 20* 19* 24  GLUCOSE  152* 160* 134*  BUN 23* 26* 43*  CREATININE 2.51* 2.76* 3.11*  CALCIUM 8.6* 8.1* 8.7*   GFR: Estimated Creatinine Clearance: 18.6 mL/min (A) (by C-G formula based on SCr of 3.11 mg/dL (H)). Liver Function Tests: Recent Labs  Lab 10/12/17 1847  AST 46*  ALT 12*  ALKPHOS 71  BILITOT 1.6*  PROT 7.4  ALBUMIN 3.7   No results for input(s): LIPASE, AMYLASE in the last 168 hours. No results for input(s): AMMONIA in the last 168 hours. Coagulation Profile: No results for input(s): INR, PROTIME in the last 168  hours. Cardiac Enzymes: Recent Labs  Lab 10/13/17 0156 10/13/17 0733 10/13/17 1214  TROPONINI 0.96* 1.09* 0.83*   BNP (last 3 results) No results for input(s): PROBNP in the last 8760 hours. HbA1C: No results for input(s): HGBA1C in the last 72 hours. CBG: No results for input(s): GLUCAP in the last 168 hours. Lipid Profile: No results for input(s): CHOL, HDL, LDLCALC, TRIG, CHOLHDL, LDLDIRECT in the last 72 hours. Thyroid Function Tests: No results for input(s): TSH, T4TOTAL, FREET4, T3FREE, THYROIDAB in the last 72 hours. Anemia Panel: No results for input(s): VITAMINB12, FOLATE, FERRITIN, TIBC, IRON, RETICCTPCT in the last 72 hours. Urine analysis:    Component Value Date/Time   COLORURINE YELLOW 10/12/2017 2123   APPEARANCEUR CLEAR 10/12/2017 2123   LABSPEC 1.013 10/12/2017 2123   PHURINE 5.0 10/12/2017 2123   GLUCOSEU NEGATIVE 10/12/2017 2123   HGBUR MODERATE (A) 10/12/2017 2123   BILIRUBINUR NEGATIVE 10/12/2017 2123   KETONESUR NEGATIVE 10/12/2017 2123   PROTEINUR 30 (A) 10/12/2017 2123   UROBILINOGEN 0.2 08/15/2013 1132   NITRITE NEGATIVE 10/12/2017 2123   LEUKOCYTESUR NEGATIVE 10/12/2017 2123   Sepsis Labs: @LABRCNTIP (procalcitonin:4,lacticidven:4)  ) Recent Results (from the past 240 hour(s))  Blood Culture (routine x 2)     Status: None (Preliminary result)   Collection Time: 10/12/17  7:30 PM  Result Value Ref Range Status   Specimen Description BLOOD BLOOD RIGHT FOREARM  Final   Special Requests   Final    BOTTLES DRAWN AEROBIC AND ANAEROBIC Blood Culture adequate volume   Culture NO GROWTH < 24 HOURS  Final   Report Status PENDING  Incomplete  Blood Culture (routine x 2)     Status: None (Preliminary result)   Collection Time: 10/12/17  7:33 PM  Result Value Ref Range Status   Specimen Description BLOOD BLOOD LEFT WRIST  Final   Special Requests   Final    BOTTLES DRAWN AEROBIC AND ANAEROBIC Blood Culture adequate volume   Culture NO GROWTH < 24  HOURS  Final   Report Status PENDING  Incomplete  MRSA PCR Screening     Status: None   Collection Time: 10/13/17 11:19 AM  Result Value Ref Range Status   MRSA by PCR NEGATIVE NEGATIVE Final    Comment:        The GeneXpert MRSA Assay (FDA approved for NASAL specimens only), is one component of a comprehensive MRSA colonization surveillance program. It is not intended to diagnose MRSA infection nor to guide or monitor treatment for MRSA infections.          Radiology Studies: Dg Chest Portable 1 View  Result Date: 10/12/2017 CLINICAL DATA:  Short of breath EXAM: PORTABLE CHEST 1 VIEW COMPARISON:  09/11/2016 FINDINGS: Cardiac enlargement with vascular congestion. Probable mild interstitial edema. No effusion. Cardiac AICD unchanged. IMPRESSION: Pulmonary vascular congestion with mild interstitial edema. Electronically Signed   By: Franchot Gallo M.D.   On:  10/12/2017 20:19        Scheduled Meds: . amiodarone  200 mg Oral BID  . apixaban  2.5 mg Oral BID  . aspirin EC  81 mg Oral Daily  . carvedilol  6.25 mg Oral BID WC  . ezetimibe  10 mg Oral Daily  . ferrous sulfate  325 mg Oral Q supper  . furosemide  40 mg Oral Daily  . ipratropium-albuterol  3 mL Nebulization Q6H  . levothyroxine  25 mcg Oral Once per day on Mon Wed Fri  . levothyroxine  50 mcg Oral Once per day on Sun Tue Thu Sat  . pantoprazole  40 mg Oral Daily  . potassium chloride  40 mEq Oral Daily  . rosuvastatin  20 mg Oral q1800  . sodium chloride flush  3 mL Intravenous Q12H  . tamsulosin  0.4 mg Oral QHS   Continuous Infusions: . sodium chloride       LOS: 2 days    Time spent: 4min    Domenic Polite, MD [facility] Page via [email or web address], password [email or web address] After 7PM please contact night-coverage  10/14/2017, 9:59 AM

## 2017-10-14 NOTE — Progress Notes (Signed)
Patient ID: [name], male   DOB: 06-Apr-1932, 84 y.o.   MRN: [id number]     Advanced Heart Failure Rounding Note   Subjective:    Patient diuresed well yesterday, breathing better.  However, creatinine up to 3.11 from 2.8.  No chest pain.  Has not been out of bed.  Appears to be in atrial fibrillation this morning rate 90s-100s.   Echo: EF 20-25%, peri-apical akinesis, severe LV dilation.   Objective:   Weight Range: 182 lb 12.2 oz (82.9 kg) Body mass index is 26.22 kg/m.   Vital Signs:   Temp:  [98.2 F (36.8 C)-98.3 F (36.8 C)] 98.2 F (36.8 C) (01/01 0727) Pulse Rate:  [60-92] 86 (01/01 0727) Resp:  [18-21] 18 (01/01 0727) BP: (90-122)/(52-68) 95/68 (01/01 0727) SpO2:  [92 %-97 %] 93 % (01/01 0738) Weight:  [182 lb 12.2 oz (82.9 kg)-183 lb (83 kg)] 182 lb 12.2 oz (82.9 kg) (01/01 0507) Last BM Date: 10/12/17  Weight change: Filed Weights   10/12/17 1900 10/13/17 1107 10/14/17 0507  Weight: 183 lb (83 kg) 183 lb (83 kg) 182 lb 12.2 oz (82.9 kg)    Intake/Output:   Intake/Output Summary (Last 24 hours) at 10/14/2017 0832 Last data filed at 10/14/2017 0729 Gross per 24 hour  Intake 263 ml  Output 2300 ml  Net -2037 ml      Physical Exam    General:  Frail. No resp difficulty HEENT: Normal Neck: Supple. JVP 8-9 cm. No lymphadenopathy or thyromegaly appreciated. Cor: PMI lateral. Irregular rate & rhythm. No rubs, gallops or murmurs. Lungs: Slight crackles at bases.  Abdomen: Soft, nontender, nondistended. No hepatosplenomegaly. No bruits or masses. Good bowel sounds. Extremities: No cyanosis, clubbing, rash, edema Neuro: Alert & orientedx3, cranial nerves grossly intact. moves all 4 extremities w/o difficulty. Affect pleasant   Telemetry   Suspect atrial fibrillation, rate 90s-100s (personally reviewed).   Labs    CBC Recent Labs    10/12/17 1847 10/14/17 0241  WBC 15.7* 16.9*  HGB 13.0 11.4*  HCT 39.7 35.3*  MCV 94.7 93.1  PLT 198 580    Basic Metabolic Panel Recent Labs    10/13/17 0156 10/14/17 0241  NA 136 140  K 4.1 3.7  CL 104 105  CO2 19* 24  GLUCOSE 160* 134*  BUN 26* 43*  CREATININE 2.76* 3.11*  CALCIUM 8.1* 8.7*   Liver Function Tests Recent Labs    10/12/17 1847  AST 46*  ALT 12*  ALKPHOS 71  BILITOT 1.6*  PROT 7.4  ALBUMIN 3.7   No results for input(s): LIPASE, AMYLASE in the last 72 hours. Cardiac Enzymes Recent Labs    10/13/17 0156 10/13/17 0733 10/13/17 1214  TROPONINI 0.96* 1.09* 0.83*    BNP: BNP (last 3 results) Recent Labs    10/12/17 1847  BNP 626.2*    ProBNP (last 3 results) No results for input(s): PROBNP in the last 8760 hours.   D-Dimer No results for input(s): DDIMER in the last 72 hours. Hemoglobin A1C No results for input(s): HGBA1C in the last 72 hours. Fasting Lipid Panel No results for input(s): CHOL, HDL, LDLCALC, TRIG, CHOLHDL, LDLDIRECT in the last 72 hours. Thyroid Function Tests No results for input(s): TSH, T4TOTAL, T3FREE, THYROIDAB in the last 72 hours.  Invalid input(s): FREET3  Other results:   Imaging     No results found.   Medications:     Scheduled Medications: . amiodarone  200 mg Oral BID  . apixaban  2.5 mg Oral BID  . aspirin EC  81 mg Oral Daily  . carvedilol  6.25 mg Oral BID WC  . ezetimibe  10 mg Oral Daily  . ferrous sulfate  325 mg Oral Q supper  . furosemide  40 mg Oral Daily  . ipratropium-albuterol  3 mL Nebulization Q6H  . levothyroxine  25 mcg Oral Once per day on Mon Wed Fri  . levothyroxine  50 mcg Oral Once per day on Sun Tue Thu Sat  . pantoprazole  40 mg Oral Daily  . potassium chloride  40 mEq Oral Daily  . rosuvastatin  20 mg Oral q1800  . sodium chloride flush  3 mL Intravenous Q12H  . tamsulosin  0.4 mg Oral QHS     Infusions: . sodium chloride       PRN Medications:  sodium chloride, diphenhydrAMINE **AND** acetaminophen, acetaminophen, hydrOXYzine, ondansetron (ZOFRAN) IV, sodium  chloride, sodium chloride flush    Patient Profile   [name] Makki Fairclothis a 84 y.o.malewith history of CAD/PCI, chronicsystolicCHFEF 00%F/VCB, PAF on Eliquis,CKD 4,HTN, tremor, and h/o bladder CA  Admitted 12/30 withacute on chronic systolic CHF/respiratory failure requiring BiPAP.   Assessment/Plan   1. Acute on chronic systolic CHF: Ischemic cardiomyopathy.  Echo this admission with EF 20-25% (stable). He has diuresed with IV Lasix, breathing improved.  Still at least mild volume overload on exam but creatinine up to 3.11.  - With rise in creatinine, will transition to po diuretics (stop IV Lasix).  At home, he was only on Lasix 20 mg daily.  He will need more than this, will give 40 mg daily starting this evening for now, when creatinine stabilizes may need a higher dose.   - Hold off on valsartan for now with elevated creatinine. - Continue home Coreg.  2. CAD: Pleuritic chest pain but no ischemic-type pain.  Troponin elevated to 1 without trend.  Possible demand ischemia with CKD IV + volume overload.   - Restarted statin and Zetia.  - No ASA given Eliquis use.  - Would hold off on cardiac cath given lack of definitive ACS and AKI on CKD stage 4. 3. Atrial fibrillation: Paroxysmal.  He is on Eliquis 2.5 mg bid.  He had not had recent atrial fibrillation according to device interrogation on 12/31, but today appears to be in atrial fibrillation with reasonably controlled rate. - Will confirm with ECG. - Increase amiodarone to 200 mg bid for now.  4. AKI on CKD stage 4: BUN/creatinine higher today.  As above, stopping IV diuresis.  Follow closely.  He is off his ARB.    Need to mobilize, out of bed today.   Length of Stay: 2  Loralie Champagne, MD  10/14/2017, 8:32 AM  Advanced Heart Failure Team Pager [phone] (M-F; 7a - 4p)  Please contact [facility] for night-coverage after hours (4p -7a ) and weekends on [email or web address]

## 2017-10-15 ENCOUNTER — Telehealth (HOSPITAL_COMMUNITY): Payer: Self-pay | Admitting: *Deleted

## 2017-10-15 DIAGNOSIS — I5022 Chronic systolic (congestive) heart failure: Secondary | ICD-10-CM

## 2017-10-15 DIAGNOSIS — I255 Ischemic cardiomyopathy: Secondary | ICD-10-CM

## 2017-10-15 LAB — CBC WITH DIFFERENTIAL/PLATELET
BASOS ABS: 0 10*3/uL (ref 0.0–0.1)
Basophils Relative: 0 %
EOS PCT: 0 %
Eosinophils Absolute: 0 10*3/uL (ref 0.0–0.7)
HEMATOCRIT: 36.5 % — AB (ref 39.0–52.0)
Hemoglobin: 12.1 g/dL — ABNORMAL LOW (ref 13.0–17.0)
Lymphocytes Relative: 10 %
Lymphs Abs: 1.3 10*3/uL (ref 0.7–4.0)
MCH: 31.3 pg (ref 26.0–34.0)
MCHC: 33.2 g/dL (ref 30.0–36.0)
MCV: 94.3 fL (ref 78.0–100.0)
MONO ABS: 0.8 10*3/uL (ref 0.1–1.0)
MONOS PCT: 7 %
NEUTROS ABS: 10.1 10*3/uL — AB (ref 1.7–7.7)
Neutrophils Relative %: 83 %
PLATELETS: 191 10*3/uL (ref 150–400)
RBC: 3.87 MIL/uL — ABNORMAL LOW (ref 4.22–5.81)
RDW: 14.5 % (ref 11.5–15.5)
WBC: 12.3 10*3/uL — ABNORMAL HIGH (ref 4.0–10.5)

## 2017-10-15 LAB — BASIC METABOLIC PANEL
Anion gap: 8 (ref 5–15)
BUN: 53 mg/dL — AB (ref 6–20)
CO2: 28 mmol/L (ref 22–32)
Calcium: 8.9 mg/dL (ref 8.9–10.3)
Chloride: 105 mmol/L (ref 101–111)
Creatinine, Ser: 3.14 mg/dL — ABNORMAL HIGH (ref 0.61–1.24)
GFR calc Af Amer: 20 mL/min — ABNORMAL LOW (ref >60)
GFR, EST NON AFRICAN AMERICAN: 17 mL/min — AB (ref >60)
GLUCOSE: 116 mg/dL — AB (ref 65–99)
Potassium: 4.7 mmol/L (ref 3.5–5.1)
Sodium: 141 mmol/L (ref 135–145)

## 2017-10-15 MED ORDER — POTASSIUM CHLORIDE CRYS ER 10 MEQ PO TBCR: 20.0000 meq | EXTENDED_RELEASE_TABLET | Freq: Every day | ORAL | 0 refills | Status: DC

## 2017-10-15 MED ORDER — FUROSEMIDE 20 MG PO TABS: 40.0000 mg | ORAL_TABLET | Freq: Every day | ORAL | 0 refills | Status: DC

## 2017-10-15 MED ORDER — SALINE SPRAY 0.65 % NA SOLN
1.0000 | NASAL | Status: DC | PRN
  Administered 2017-10-15: 1 via NASAL
  Filled 2017-10-15: qty 44

## 2017-10-15 MED ORDER — AMIODARONE HCL 100 MG PO TABS: 200.0000 mg | ORAL_TABLET | Freq: Every day | ORAL | 0 refills | Status: DC

## 2017-10-15 NOTE — Evaluation (Signed)
Occupational Therapy Evaluation and Discharge Patient Details Name: [name] MRN: [id number] DOB: 01/12/1932 Today's Date: 10/15/2017    History of Present Illness  84 y.o. male with history of CAD/PCI, chronic systolic CHF EF 78% w/ICD, PAF on Eliquis, CKD 4,  HTN,  tremor, h/o bladder CA, admitted 12/30 with acute on chronic systolic CHF/respiratory failure    Clinical Impression   Pt is functioning at a supervision level in ADL and ADL transfers with RW. Initially, pt wanted to try to ambulate without AD, but agreed he was safer with it and to use it at home. Educated in energy conservation. No further OT needs.   Follow Up Recommendations  No OT follow up    Equipment Recommendations  None recommended by OT    Recommendations for Other Services       Precautions / Restrictions Precautions Precautions: Fall Restrictions Weight Bearing Restrictions: No      Mobility Bed Mobility Overal bed mobility: Modified Independent                Transfers Overall transfer level: Needs assistance Equipment used: Rolling walker (2 wheeled) Transfers: Sit to/from Stand Sit to Stand: Supervision         General transfer comment: pt stood intially and ambulated with hand held assist, much safer with RW    Balance Overall balance assessment: Needs assistance   Sitting balance-Leahy Scale: Good       Standing balance-Leahy Scale: Poor                             ADL either performed or assessed with clinical judgement   ADL                                         General ADL Comments: supervision for safety and cues to use walker, pt admits he is unsteady and agreeable to walker use, pt is not interested in AE for LB dressing.     Vision Patient Visual Report: No change from baseline       Perception     Praxis      Pertinent Vitals/Pain Pain Assessment: No/denies pain     Hand Dominance Right   Extremity/Trunk  Assessment Upper Extremity Assessment Upper Extremity Assessment: Overall WFL for tasks assessed   Lower Extremity Assessment Lower Extremity Assessment: Defer to PT evaluation   Cervical / Trunk Assessment Cervical / Trunk Assessment: Normal   Communication Communication Communication: No difficulties   Cognition Arousal/Alertness: Awake/alert Behavior During Therapy: WFL for tasks assessed/performed Overall Cognitive Status: Within Functional Limits for tasks assessed                                     General Comments       Exercises     Shoulder Instructions      Home Living Family/patient expects to be discharged to:: Private residence Living Arrangements: Spouse/significant other Available Help at Discharge: Family;Available 24 hours/day Type of Home: House Home Access: Stairs to enter [employer] of Steps: 4   Home Layout: Two level;Laundry or work area in [address] Shower/Tub: Occupational psychologist: [place]: Environmental consultant - 2 wheels;Cane - quad;Shower seat;Bedside commode  Prior Functioning/Environment Level of Independence: Needs assistance  Gait / Transfers Assistance Needed: walks without device ADL's / Homemaking Assistance Needed: wife helps with socks and all IADL   Comments: pt reports 2 falls in the last year        OT Problem List: Impaired balance (sitting and/or standing)      OT Treatment/Interventions:      OT Goals(Current goals can be found in the care plan section) Acute Rehab OT Goals Patient Stated Goal: return home  OT Frequency:     Barriers to D/C:            Co-evaluation              AM-PAC PT "6 Clicks" Daily Activity     Outcome Measure Help from another person eating meals?: None Help from another person taking care of personal grooming?: A Little Help from another person toileting, which includes using toliet, bedpan, or urinal?: A  Little Help from another person bathing (including washing, rinsing, drying)?: A Little Help from another person to put on and taking off regular upper body clothing?: None Help from another person to put on and taking off regular lower body clothing?: A Little 6 Click Score: 20   End of Session Equipment Utilized During Treatment: Rolling walker Nurse Communication: Mobility status  Activity Tolerance: Patient tolerated treatment well Patient left: in chair  OT Visit Diagnosis: Unsteadiness on feet (R26.81)                Time: 4128-2081 OT Time Calculation (min): 23 min Charges:  OT General Charges $OT Visit: 1 Visit OT Evaluation $OT Eval Moderate Complexity: 1 Mod OT Treatments $Self Care/Home Management : 8-22 mins G-Codes:     Malka So 10/15/2017, 10:23 AM  10/15/2017 Nestor Lewandowsky, OTR/L Pager: [phone]

## 2017-10-15 NOTE — Discharge Summary (Signed)
Physician Discharge Summary  [name] XNT:[id number] DOB: February 04, 1932 DOA: 10/12/2017  PCP: [name], MD  Admit date: 10/12/2017 Discharge date: 10/15/2017  Time spent: 35 minutes  Recommendations for Outpatient Follow-up:  Cardiology Dr.Bensimhon in 2 weeks, please check B met at follow-up Renal Dr. Pearson Grippe in 2 weeks  Discharge Diagnoses:  Principal Problem:   Acute respiratory failure with hypoxia ([place])   Acute on chronic systolic and diastolic CHF   Essential hypertension, benign   CAD, NATIVE VESSEL   Atrial fibrillation ([place])   ICD (implantable cardioverter-defibrillator) in place (St Jude pacer/ICD)   CKD (chronic kidney disease) stage 4, GFR 15-29 ml/min (HCC)   Acute bronchitis with COPD ([place])   COPD   Discharge Condition: Improved  Diet recommendation: Low sodium heart healthy  Filed Weights   10/14/17 0507 10/14/17 1755 10/15/17 0252  Weight: 82.9 kg (182 lb 12.2 oz) 81.8 kg (180 lb 5.4 oz) 81 kg (178 lb 9.2 oz)    History of present illness:  [name] Heffley Fairclothis a 85 y.o.malewith history of CAD/PCI, chronic systolic CHF EF 96%P/RFF, PAF on Eliquis, CKD 4,  HTN,  tremor, h/o bladder CA, admitted 12/30 night with dyspnea  Hospital Course:   Acute respiratory failure with hypoxia (HCC) -Due to acute on chronic systolic CHF, improved -Weaned off BiPAP -Diuresed well with IV Lasix, -2.7L but urine output not accurate, weight down 5lbs to 178(81kg), Last echo with EF of 25% in 2017, repeat without much changes -Bump in creatinine noted and appears close to euvolemic hence transitioned to oral Lasix 32m daily -followed by CHF team this admission and felt to be stable for discharge home on PO lasix and FU with Renal and heart failure team  Acute on chronic systolic CHF/ischemic cardiomyopathy -Status post AICD -As above -Continue Coreg, lasix as above  Elevated troponin -Likely due to demand ischemia in the setting of hypoxemia/acute CHF,  and decreased renal clearance due to CKD4, I do not think he has ACS at this time. -Echo with EF of 20% and akinesis noted -Continue aspirin, Coreg, followed by Cards this admission, no further workup recommended  Paroxysmal atrial fibrillation -chads2vasc score>3, continue Coreg, amiodarone and low dose apixaban -In A. fib now, amiodarone dose increased per cards, stable HR improved  CKD4 -[facility] -Baseline creatinine is 2.5-3 range -Slight bump with diuresis noted to 3.1 noted, stopped ARB on admission -Creatinine still close enough to his baseline range, hence discharged home and advised close follow-up with his nephrologist -He would be a very poor dialysis candidate when he gets to that stage  Hypertension -Stopped ARB, continue Coreg and diuretics  COPD -Stable, not on home oxygen -continue nebs PRN     Consultations: CHF team  Discharge Exam: Vitals:   10/15/17 1015 10/15/17 1035  BP: (!) 99/51   Pulse:    Resp:    Temp:    SpO2:  93%    General: AAOx3 Cardiovascular: S1S2/RRR Respiratory: CTAB  Discharge Instructions   Discharge Instructions    Diet - low sodium heart healthy   Complete by:  As directed    Increase activity slowly   Complete by:  As directed      Allergies as of 10/15/2017      Reactions   Ramipril Other (See Comments)   cough   Penicillins Itching, Rash   Has patient had a PCN reaction causing immediate rash, facial/tongue/throat swelling, SOB or lightheadedness with hypotension: No Has  patient had a PCN reaction causing severe rash involving mucus membranes or skin necrosis: Yes-back and hands. Has patient had a PCN reaction that required hospitalization: No Has patient had a PCN reaction occurring within the last 10 years: Yes If all of the above answers are "NO", then may proceed with Cephalosporin use.      Medication List    STOP taking these medications   lidocaine  5 % Commonly known as:  LIDODERM   losartan 50 MG tablet Commonly known as:  COZAAR     TAKE these medications   acetaminophen 325 MG tablet Commonly known as:  TYLENOL Take 2 tablets (650 mg total) by mouth every 6 (six) hours as needed for mild pain (or Fever >/= 101). What changed:    how much to take  reasons to take this  Another medication with the same name was removed. Continue taking this medication, and follow the directions you see here.   amiodarone 100 MG tablet Commonly known as:  PACERONE Take 2 tablets (200 mg total) by mouth daily. What changed:  how much to take   apixaban 2.5 MG Tabs tablet Commonly known as:  ELIQUIS Take 1 tablet (2.5 mg total) by mouth 2 (two) times daily. Needs office visit What changed:  additional instructions   AZO BLADDER CONTROL/GO-LESS Caps Take 1 capsule by mouth daily as needed (for bladder incontinence).   carvedilol 6.25 MG tablet Commonly known as:  COREG Take 1 tablet (6.25 mg total) by mouth 2 (two) times daily with a meal. NEEDS FOLLOW UP 06/2017 What changed:  additional instructions   CENTRUM SILVER PO Take 1 tablet by mouth at bedtime.   [employer] IRON 325 (65 FE) MG tablet Generic drug:  ferrous sulfate Take 325 mg by mouth daily with supper.   diphenhydramine-acetaminophen 25-500 MG Tabs tablet Commonly known as:  TYLENOL PM Take 1 tablet by mouth at bedtime as needed (sleep).   ezetimibe 10 MG tablet Commonly known as:  ZETIA TAKE ONE TABLET BY MOUTH ONCE DAILY WITH SUPPER What changed:  See the new instructions. Notes to patient:  Gave dose at 10 am 1/2 (today)   furosemide 20 MG tablet Commonly known as:  LASIX Take 2 tablets (40 mg total) by mouth daily. What changed:  how much to take   hydrOXYzine 25 MG tablet Commonly known as:  ATARAX/VISTARIL Take 1 tablet by mouth at bedtime as needed for itching (legs.). Reported on 10/19/2015   levothyroxine 50 MCG tablet Commonly known as:  SYNTHROID,  LEVOTHROID Take 25-50 mcg by mouth daily before breakfast. Take 50 mcg (1 tablet) on Tues, Thurs, Sat, Sun and 25 mcg (1/2 tablet) on Mon, Wed, Fri   MILK OF MAGNESIA PO Take 20 mLs by mouth daily as needed (for constipation).   nitroGLYCERIN 0.4 MG SL tablet Commonly known as:  NITROSTAT DISSOLVE ONE TABLET UNDER THE TONGUE EVERY 5 MINUTES AS NEEDED FOR CHEST PAIN.  DO NOT EXCEED A TOTAL OF 3 DOSES IN 15 MINUTES   pantoprazole 40 MG tablet Commonly known as:  PROTONIX Take 40 mg by mouth daily.   potassium chloride 10 MEQ tablet Commonly known as:  KLOR-CON M10 Take 2 tablets (20 mEq total) by mouth daily. What changed:  how much to take   rosuvastatin 20 MG tablet Commonly known as:  CRESTOR Take 1 tablet (20 mg total) by mouth daily. What changed:  when to take this   sodium chloride 0.65 % Soln nasal spray Commonly known  as:  OCEAN Place 1-2 sprays into both nostrils daily as needed for congestion.   tamsulosin 0.4 MG Caps capsule Commonly known as:  FLOMAX Take 0.4 mg by mouth at bedtime.      Allergies  Allergen Reactions  . Ramipril Other (See Comments)    cough  . Penicillins Itching and Rash    Has patient had a PCN reaction causing immediate rash, facial/tongue/throat swelling, SOB or lightheadedness with hypotension: No Has patient had a PCN reaction causing severe rash involving mucus membranes or skin necrosis: Yes-back and hands. Has patient had a PCN reaction that required hospitalization: No Has patient had a PCN reaction occurring within the last 10 years: Yes If all of the above answers are "NO", then may proceed with Cephalosporin use.   Follow-up Information    Bensimhon, Shaune Pascal, MD. Schedule an appointment as soon as possible for a visit in 2 week(s).   Specialty:  Cardiology Contact information: [address] [place] [address] [phone]        Rexene Agent, MD. Schedule an appointment as soon as possible for a visit  in 2 week(s).   Specialty:  Nephrology Contact information: [place] [address] [phone]            The results of significant diagnostics from this hospitalization (including imaging, microbiology, ancillary and laboratory) are listed below for reference.    Significant Diagnostic Studies: Dg Chest Portable 1 View  Result Date: 10/12/2017 CLINICAL DATA:  Short of breath EXAM: PORTABLE CHEST 1 VIEW COMPARISON:  09/11/2016 FINDINGS: Cardiac enlargement with vascular congestion. Probable mild interstitial edema. No effusion. Cardiac AICD unchanged. IMPRESSION: Pulmonary vascular congestion with mild interstitial edema. Electronically Signed   By: Franchot Gallo M.D.   On: 10/12/2017 20:19    Microbiology: Recent Results (from the past 240 hour(s))  Blood Culture (routine x 2)     Status: None (Preliminary result)   Collection Time: 10/12/17  7:30 PM  Result Value Ref Range Status   Specimen Description BLOOD BLOOD RIGHT FOREARM  Final   Special Requests   Final    BOTTLES DRAWN AEROBIC AND ANAEROBIC Blood Culture adequate volume   Culture NO GROWTH 2 DAYS  Final   Report Status PENDING  Incomplete  Blood Culture (routine x 2)     Status: None (Preliminary result)   Collection Time: 10/12/17  7:33 PM  Result Value Ref Range Status   Specimen Description BLOOD BLOOD LEFT WRIST  Final   Special Requests   Final    BOTTLES DRAWN AEROBIC AND ANAEROBIC Blood Culture adequate volume   Culture NO GROWTH 2 DAYS  Final   Report Status PENDING  Incomplete  MRSA PCR Screening     Status: None   Collection Time: 10/13/17 11:19 AM  Result Value Ref Range Status   MRSA by PCR NEGATIVE NEGATIVE Final    Comment:        The GeneXpert MRSA Assay (FDA approved for NASAL specimens only), is one component of a comprehensive MRSA colonization surveillance program. It is not intended to diagnose MRSA infection nor to guide or monitor treatment for MRSA infections.       Labs: Basic Metabolic Panel: Recent Labs  Lab 10/12/17 1847 10/13/17 0156 10/14/17 0241 10/15/17 0245  NA 137 136 140 141  K 4.8 4.1 3.7 4.7  CL 103 104 105 105  CO2 20* 19* 24 28  GLUCOSE 152* 160* 134* 116*  BUN 23*  26* 43* 53*  CREATININE 2.51* 2.76* 3.11* 3.14*  CALCIUM 8.6* 8.1* 8.7* 8.9   Liver Function Tests: Recent Labs  Lab 10/12/17 1847  AST 46*  ALT 12*  ALKPHOS 71  BILITOT 1.6*  PROT 7.4  ALBUMIN 3.7   No results for input(s): LIPASE, AMYLASE in the last 168 hours. No results for input(s): AMMONIA in the last 168 hours. CBC: Recent Labs  Lab 10/12/17 1847 10/14/17 0241 10/15/17 0245  WBC 15.7* 16.9* 12.3*  NEUTROABS  --   --  10.1*  HGB 13.0 11.4* 12.1*  HCT 39.7 35.3* 36.5*  MCV 94.7 93.1 94.3  PLT 198 173 191   Cardiac Enzymes: Recent Labs  Lab 10/13/17 0156 10/13/17 0733 10/13/17 1214  TROPONINI 0.96* 1.09* 0.83*   BNP: BNP (last 3 results) Recent Labs    10/12/17 1847  BNP 626.2*    ProBNP (last 3 results) No results for input(s): PROBNP in the last 8760 hours.  CBG: No results for input(s): GLUCAP in the last 168 hours.     Signed:  Domenic Polite MD.  [facility] 10/15/2017, 12:51 PM

## 2017-10-15 NOTE — Care Management Note (Signed)
Case Management Note Original note by: Fuller Mandril, RN 10/13/2017, 9:24 AM    Patient Details  Name: [name] MRN: [id number] Date of Birth: October 20, 1931  Subjective/Objective:                  84 y.o. male with medical history significant of CHF last EF 2017 35%, afib on eloquis (but ran out recently), CKD baseline cr 2, CAD, COPD, not on home oxygen comes in with several days of worsening sob and cough. Form home with spouse.  Action/Plan: Admit status INPATIENT (ACUTE RESPIRATORY FAILURE WITH HYPOXIA); anticipate discharge [place].   Expected Discharge Date:  10/15/17               Expected Discharge Plan:  [place]  In-House Referral:     Discharge planning Services  CM Consult  Status of Service:  Completed, signed off  If discussed at Long Length of Stay Meetings, dates discussed:    Additional Comments:  10/15/16 J. Aryaman Haliburton, RN, BSN Pt medically stable for discharge home today with spouse.  PT recommending HHPT; pt would benefit from [facility] for [facility]F follow up.  Pt agreeable to [facility] follow up, and orders received from attending MD.  Referral to [facility], per pt/spouse choice.  Start of care 24-48h post dc date.  No DME needs, per pt.   Ella Bodo, RN 10/15/2017, 2:14 PM

## 2017-10-15 NOTE — Telephone Encounter (Signed)
appts sch, order for bmet placed

## 2017-10-15 NOTE — Progress Notes (Signed)
CARDIAC REHAB PHASE I   PRE:  Rate/Rhythm: pacing 73  BP:  Supine:   Sitting: 114/50  Standing:    SaO2: 96%RA  MODE:  Ambulation: 470 ft   POST:  Rate/Rhythm: 95  BP:  Supine:   Sitting: 116/64  Standing:    SaO2: 97%RA 1115-1202 Pt walked 470 ft with his rolling walker with fairly steady gait. Tolerated well. Encouraged walker at home. Stated he has one he can use. Gave pt CHF booklet and discussed when to call MD with daily weights and signs/symptoms of CHF. Gave low sodium diets and encouraged 2000 mg. Discussed 2L FR. Offered CRP 2 but pt does not want to attend. Stated PT recommended  HHPT which he is open to.  Has attended CRP 2 before he stated.   Graylon Good, RN BSN  10/15/2017 11:57 AM

## 2017-10-15 NOTE — Progress Notes (Signed)
Patient ID: [name], male   DOB: 11-Dec-1931, 84 y.o.   MRN: [id number]     Advanced Heart Failure Rounding Note   Subjective:    Feeling OK this am. Denies CP. Wants to go home. Says still having great UOP.   Creatinine relatively stable at 3.1 but BUN up slightly. Cr 3.14 and BUN 53 today. Down 2 lbs on lasix po 40 mg daily ( was on 20 mg at home)  Echo: EF 20-25%, peri-apical akinesis, severe LV dilation.   Objective:   Weight Range: 178 lb 9.2 oz (81 kg) Body mass index is 25.62 kg/m.   Vital Signs:   Temp:  [97.9 F (36.6 C)-98.2 F (36.8 C)] 97.9 F (36.6 C) (01/01 2057) Pulse Rate:  [90-130] 102 (01/01 2057) Resp:  [18-24] 18 (01/01 2057) BP: (75-105)/(60-78) 105/78 (01/02 0430) SpO2:  [92 %-95 %] 93 % (01/01 2057) Weight:  [178 lb 9.2 oz (81 kg)-180 lb 5.4 oz (81.8 kg)] 178 lb 9.2 oz (81 kg) (01/02 0252) Last BM Date: 10/12/17  Weight change: Filed Weights   10/14/17 0507 10/14/17 1755 10/15/17 0252  Weight: 182 lb 12.2 oz (82.9 kg) 180 lb 5.4 oz (81.8 kg) 178 lb 9.2 oz (81 kg)    Intake/Output:   Intake/Output Summary (Last 24 hours) at 10/15/2017 0811 Last data filed at 10/15/2017 0321 Gross per 24 hour  Intake 360 ml  Output 1300 ml  Net -940 ml    Physical Exam    General: Frail. NAD.  HEENT: Normal Neck: Supple. JVP 7-8. Carotids 2+ bilat; no bruits. No thyromegaly or nodule noted. Cor: PMI nondisplaced. RRR, No M/G/R noted Lungs: Slighty basilar crackles. Scant expiratory wheeze.  Abdomen: Soft, non-tender, non-distended, no HSM. No bruits or masses. +BS  Extremities: No cyanosis, clubbing, or rash. R and LLE no edema.  Neuro: Alert & orientedx3, cranial nerves grossly intact. moves all 4 extremities w/o difficulty. Affect pleasant   Telemetry   NSR, V paced, Personally reviewed.   Labs    CBC Recent Labs    10/14/17 0241 10/15/17 0245  WBC 16.9* 12.3*  NEUTROABS  --  10.1*  HGB 11.4* 12.1*  HCT 35.3* 36.5*  MCV 93.1 94.3    PLT 173 102   Basic Metabolic Panel Recent Labs    10/14/17 0241 10/15/17 0245  NA 140 141  K 3.7 4.7  CL 105 105  CO2 24 28  GLUCOSE 134* 116*  BUN 43* 53*  CREATININE 3.11* 3.14*  CALCIUM 8.7* 8.9   Liver Function Tests Recent Labs    10/12/17 1847  AST 46*  ALT 12*  ALKPHOS 71  BILITOT 1.6*  PROT 7.4  ALBUMIN 3.7   No results for input(s): LIPASE, AMYLASE in the last 72 hours. Cardiac Enzymes Recent Labs    10/13/17 0156 10/13/17 0733 10/13/17 1214  TROPONINI 0.96* 1.09* 0.83*    BNP: BNP (last 3 results) Recent Labs    10/12/17 1847  BNP 626.2*    ProBNP (last 3 results) No results for input(s): PROBNP in the last 8760 hours.   D-Dimer No results for input(s): DDIMER in the last 72 hours. Hemoglobin A1C No results for input(s): HGBA1C in the last 72 hours. Fasting Lipid Panel No results for input(s): CHOL, HDL, LDLCALC, TRIG, CHOLHDL, LDLDIRECT in the last 72 hours. Thyroid Function Tests No results for input(s): TSH, T4TOTAL, T3FREE, THYROIDAB in the last 72 hours.  Invalid input(s): FREET3  Other results:   Imaging  No results found.   Medications:     Scheduled Medications: . amiodarone  200 mg Oral BID  . apixaban  2.5 mg Oral BID  . aspirin EC  81 mg Oral Daily  . carvedilol  6.25 mg Oral BID WC  . ezetimibe  10 mg Oral Daily  . ferrous sulfate  325 mg Oral Q supper  . furosemide  40 mg Oral Daily  . ipratropium-albuterol  3 mL Nebulization Q6H  . levothyroxine  25 mcg Oral Once per day on Mon Wed Fri  . levothyroxine  50 mcg Oral Once per day on Sun Tue Thu Sat  . pantoprazole  40 mg Oral Daily  . potassium chloride  40 mEq Oral Daily  . rosuvastatin  20 mg Oral q1800  . sodium chloride flush  3 mL Intravenous Q12H  . tamsulosin  0.4 mg Oral QHS    Infusions: . sodium chloride      PRN Medications: sodium chloride, diphenhydrAMINE **AND** acetaminophen, acetaminophen, hydrOXYzine, ondansetron (ZOFRAN) IV,  sodium chloride, sodium chloride flush    Patient Profile   Hines Kloss Fairclothis a 84 y.o.malewith history of CAD/PCI, chronicsystolicCHFEF 03%K/VQQ, PAF on Eliquis,CKD 4,HTN, tremor, and h/o bladder CA  Admitted 12/30 withacute on chronic systolic CHF/respiratory failure requiring BiPAP.   Assessment/Plan   1. Acute on chronic systolic CHF: Ischemic cardiomyopathy.  Echo this admission with EF 20-25% (stable). He has diuresed with IV Lasix, breathing improved.   - Diuresing gently on slightly increase po lasix.   - Continue lasix 40 mg daily for now.  - Hold off on valsartan for now with elevated creatinine. - Continue home Coreg.  2. CAD: Pleuritic chest pain but no ischemic-type pain.  Troponin elevated to 1 without trend.  Possible demand ischemia with CKD IV + volume overload.   - Continue statin and Zetia.  - No ASA given Eliquis use.  - Would hold off on cardiac cath given lack of definitive ACS and AKI on CKD stage 4. 3. Atrial fibrillation: Paroxysmal.  He is on Eliquis 2.5 mg bid.  He had not had recent atrial fibrillation according to device interrogation on 12/31, was in atrial fibrillation on 1/1 and amiodarone increased.  He is in NSR today. - Afib RVR by ECG 10/14/16.  - Continue amiodarone 200 mg BID while in hospital, decrease to 200 mg daily when he goes home.  4. AKI on CKD stage 4: BUN/creatinine slightly higher again today.  - Follow closely.  He is off his ARB.    No further aggressive treatment from HF perspective. Could keep overnight to continue and monitor kidney function, vs send home with close follow up.   If home today, would send on following meds  HF meds for home - Lasix 40 mg daily - Amiodarone 200 mg daily - Eliquis 2.5 mg BID - Coreg 6.25 mg BID - ASA 81 mg daily - NO VALSARTAN FOR NOW - Zetia 10 mg daily - Crestor 20 mg daily  Length of Stay: 3  Shirley Friar, PA-C  10/15/2017, 8:11 AM  Advanced Heart Failure  Team Pager [phone] (M-F; 7a - 4p)  Please contact [facility] for night-coverage after hours (4p -7a ) and weekends on [email or web address]  Patient seen with PA, agree with the above note.  He is stable clinically this morning.  Mild volume overload on exam with JVP 8, now on po Lasix.  - Would send home on Lasix 40 mg daily (higher dose) and keep off valsartan  for now.   He was in atrial fibrillation yesterday, increased amiodarone and back in NSR this morning.  Would continue amiodarone 200 mg bid while in hospital then decrease to 200 mg daily for discharge.   See med list above for discharge.  Will followup CHF clinic.   Loralie Champagne 10/15/2017 8:46 AM

## 2017-10-15 NOTE — Telephone Encounter (Signed)
-----   Message from Shirley Friar, PA-C sent at 10/15/2017  8:46 AM EST -----  Home today vs tomorrow.   Would you please schedule for labs on Monday,  And then give him my 1000 slot on 10/24/16?  Thanks!

## 2017-10-15 NOTE — Progress Notes (Signed)
Reviewed discharge paperwork with Pt and pt's wife. Prescriptions handed to wife. No further questions. IV removed with out complications. Pt discharged to home.

## 2017-10-15 NOTE — Progress Notes (Signed)
Physical Therapy Treatment Patient Details Name: [name] MRN: [id number] DOB: 27-Sep-1932 Today's Date: 10/15/2017    History of Present Illness Pt is a 84 y.o. male with history of CAD/PCI, chronic systolic CHF EF 81% w/ICD, PAF on Eliquis, CKD 4,  HTN,  tremor, h/o bladder CA, admitted 12/30 with acute on chronic systolic CHF/respiratory failure.    PT Comments    Pt progressing with mobility. Expecting to d/c this afternoon with family. Initiated stair training, requiring rail and supervision for safety. Pt states he will not use RW at home, but max encouragement and education on continuing to do so for the time being secondary to decreased strength, decreased stability, and increased fall risk. Continue to recommend HHPT and supervision for mobility.    Follow Up Recommendations  Home health PT;Supervision for mobility/OOB     Equipment Recommendations  None recommended by PT    Recommendations for Other Services       Precautions / Restrictions Precautions Precautions: Fall Restrictions Weight Bearing Restrictions: No    Mobility  Bed Mobility Overal bed mobility: Modified Independent                Transfers Overall transfer level: Needs assistance Equipment used: Rolling walker (2 wheeled) Transfers: Sit to/from Stand Sit to Stand: Supervision         General transfer comment: Pt adament about standing by pulling with BUEs on RW, despite educ on safe hand placement. Able to stand on 5th attempt with supervision for safety  Ambulation/Gait Ambulation/Gait assistance: Supervision Ambulation Distance (Feet): 250 Feet Assistive device: Rolling walker (2 wheeled) Gait Pattern/deviations: Step-through pattern;Decreased stride length;Trunk flexed Gait velocity: Decreased Gait velocity interpretation: <1.8 ft/sec, indicative of risk for recurrent falls General Gait Details: Pt initially stepping out of RW intermittently, requiring cues to correct.  Progressing to supervision for safety with RW. Pt states he will not use RW at home, but requires min guard with unsteady amb without UE support; recommend use of RW for time being   Stairs Stairs: Yes   Stair Management: One rail Right;Step to pattern;Alternating pattern;Forwards Number of Stairs: 6 General stair comments: Ascend/descended 6 steps with single UE support on rail; alternating pattern descend, and step-to for ascent  Wheelchair Mobility    Modified Rankin (Stroke Patients Only)       Balance Overall balance assessment: Needs assistance   Sitting balance-Leahy Scale: Good       Standing balance-Leahy Scale: Poor                              Cognition Arousal/Alertness: Awake/alert Behavior During Therapy: WFL for tasks assessed/performed Overall Cognitive Status: Within Functional Limits for tasks assessed                                        Exercises      General Comments General comments (skin integrity, edema, etc.): Wife present during session      Pertinent Vitals/Pain Pain Assessment: No/denies pain    Home Living Family/patient expects to be discharged to:: Private residence Living Arrangements: Spouse/significant other Available Help at Discharge: Family;Available 24 hours/day Type of Home: House Home Access: Stairs to enter   Home Layout: Two level;Laundry or work area in [place]: Environmental consultant - 2 wheels;Cane - quad;Shower seat;Bedside commode      Prior Function Level  of Independence: Needs assistance  Gait / Transfers Assistance Needed: walks without device ADL's / Homemaking Assistance Needed: wife helps with socks and all IADL Comments: pt reports 2 falls in the last year   PT Goals (current goals can now be found in the care plan section) Acute Rehab PT Goals Patient Stated Goal: return home PT Goal Formulation: With patient Time For Goal Achievement: 10/28/17 Potential to Achieve  Goals: Good Progress towards PT goals: Progressing toward goals    Frequency    Min 3X/week      PT Plan Current plan remains appropriate    Co-evaluation              AM-PAC PT "6 Clicks" Daily Activity  Outcome Measure  Difficulty turning over in bed (including adjusting bedclothes, sheets and blankets)?: None Difficulty moving from lying on back to sitting on the side of the bed? : None Difficulty sitting down on and standing up from a chair with arms (e.g., wheelchair, bedside commode, etc,.)?: A Little Help needed moving to and from a bed to chair (including a wheelchair)?: A Little Help needed walking in hospital room?: A Little Help needed climbing 3-5 steps with a railing? : A Little 6 Click Score: 20    End of Session Equipment Utilized During Treatment: Gait belt Activity Tolerance: Patient tolerated treatment well Patient left: in bed;with call bell/phone within reach;with family/visitor present Nurse Communication: Mobility status PT Visit Diagnosis: Other abnormalities of gait and mobility (R26.89);History of falling (Z91.81)     Time: 2751-7001 PT Time Calculation (min) (ACUTE ONLY): 19 min  Charges:  $Gait Training: 8-22 mins                    G Codes:      Mabeline Caras, PT, DPT Acute Rehab Services  Pager: [place] 10/15/2017, 1:38 PM

## 2017-10-17 LAB — CULTURE, BLOOD (ROUTINE X 2)
CULTURE: NO GROWTH
CULTURE: NO GROWTH
SPECIAL REQUESTS: ADEQUATE
Special Requests: ADEQUATE

## 2017-10-20 ENCOUNTER — Ambulatory Visit (HOSPITAL_COMMUNITY)
Admission: RE | Admit: 2017-10-20 | Discharge: 2017-10-20 | Disposition: A | Discharge status: Home / Self Care | Payer: Medicare Other | Source: Ambulatory Visit | Attending: Cardiology | Admitting: Cardiology

## 2017-10-20 DIAGNOSIS — G4733 Obstructive sleep apnea (adult) (pediatric): Secondary | ICD-10-CM | POA: Diagnosis not present

## 2017-10-20 DIAGNOSIS — Z8551 Personal history of malignant neoplasm of bladder: Secondary | ICD-10-CM | POA: Diagnosis not present

## 2017-10-20 DIAGNOSIS — K219 Gastro-esophageal reflux disease without esophagitis: Secondary | ICD-10-CM | POA: Diagnosis not present

## 2017-10-20 DIAGNOSIS — J449 Chronic obstructive pulmonary disease, unspecified: Secondary | ICD-10-CM | POA: Diagnosis not present

## 2017-10-20 DIAGNOSIS — I5022 Chronic systolic (congestive) heart failure: Secondary | ICD-10-CM | POA: Diagnosis not present

## 2017-10-20 DIAGNOSIS — Z955 Presence of coronary angioplasty implant and graft: Secondary | ICD-10-CM | POA: Diagnosis not present

## 2017-10-20 DIAGNOSIS — I4891 Unspecified atrial fibrillation: Secondary | ICD-10-CM | POA: Diagnosis not present

## 2017-10-20 DIAGNOSIS — E785 Hyperlipidemia, unspecified: Secondary | ICD-10-CM | POA: Diagnosis not present

## 2017-10-20 DIAGNOSIS — I130 Hypertensive heart and chronic kidney disease with heart failure and stage 1 through stage 4 chronic kidney disease, or unspecified chronic kidney disease: Secondary | ICD-10-CM | POA: Diagnosis not present

## 2017-10-20 DIAGNOSIS — D631 Anemia in chronic kidney disease: Secondary | ICD-10-CM | POA: Diagnosis not present

## 2017-10-20 DIAGNOSIS — I5043 Acute on chronic combined systolic (congestive) and diastolic (congestive) heart failure: Secondary | ICD-10-CM | POA: Diagnosis not present

## 2017-10-20 DIAGNOSIS — J9601 Acute respiratory failure with hypoxia: Secondary | ICD-10-CM | POA: Diagnosis not present

## 2017-10-20 DIAGNOSIS — Z95810 Presence of automatic (implantable) cardiac defibrillator: Secondary | ICD-10-CM | POA: Diagnosis not present

## 2017-10-20 DIAGNOSIS — N184 Chronic kidney disease, stage 4 (severe): Secondary | ICD-10-CM | POA: Diagnosis not present

## 2017-10-20 DIAGNOSIS — I2510 Atherosclerotic heart disease of native coronary artery without angina pectoris: Secondary | ICD-10-CM | POA: Diagnosis not present

## 2017-10-20 DIAGNOSIS — Z7901 Long term (current) use of anticoagulants: Secondary | ICD-10-CM | POA: Diagnosis not present

## 2017-10-20 DIAGNOSIS — E039 Hypothyroidism, unspecified: Secondary | ICD-10-CM | POA: Diagnosis not present

## 2017-10-20 DIAGNOSIS — R7881 Bacteremia: Secondary | ICD-10-CM | POA: Diagnosis not present

## 2017-10-20 LAB — BASIC METABOLIC PANEL
ANION GAP: 7 (ref 5–15)
BUN: 42 mg/dL — ABNORMAL HIGH (ref 6–20)
CALCIUM: 9.1 mg/dL (ref 8.9–10.3)
CO2: 27 mmol/L (ref 22–32)
CREATININE: 2.81 mg/dL — AB (ref 0.61–1.24)
Chloride: 105 mmol/L (ref 101–111)
GFR calc Af Amer: 22 mL/min — ABNORMAL LOW (ref >60)
GFR, EST NON AFRICAN AMERICAN: 19 mL/min — AB (ref >60)
Glucose, Bld: 101 mg/dL — ABNORMAL HIGH (ref 65–99)
Potassium: 4.1 mmol/L (ref 3.5–5.1)
SODIUM: 139 mmol/L (ref 135–145)

## 2017-10-21 DIAGNOSIS — N184 Chronic kidney disease, stage 4 (severe): Secondary | ICD-10-CM | POA: Diagnosis not present

## 2017-10-21 DIAGNOSIS — I2510 Atherosclerotic heart disease of native coronary artery without angina pectoris: Secondary | ICD-10-CM | POA: Diagnosis not present

## 2017-10-21 DIAGNOSIS — D631 Anemia in chronic kidney disease: Secondary | ICD-10-CM | POA: Diagnosis not present

## 2017-10-21 DIAGNOSIS — I130 Hypertensive heart and chronic kidney disease with heart failure and stage 1 through stage 4 chronic kidney disease, or unspecified chronic kidney disease: Secondary | ICD-10-CM | POA: Diagnosis not present

## 2017-10-21 DIAGNOSIS — I129 Hypertensive chronic kidney disease with stage 1 through stage 4 chronic kidney disease, or unspecified chronic kidney disease: Secondary | ICD-10-CM | POA: Diagnosis not present

## 2017-10-21 DIAGNOSIS — J449 Chronic obstructive pulmonary disease, unspecified: Secondary | ICD-10-CM | POA: Diagnosis not present

## 2017-10-21 DIAGNOSIS — I5043 Acute on chronic combined systolic (congestive) and diastolic (congestive) heart failure: Secondary | ICD-10-CM | POA: Diagnosis not present

## 2017-10-21 DIAGNOSIS — D472 Monoclonal gammopathy: Secondary | ICD-10-CM | POA: Diagnosis not present

## 2017-10-22 ENCOUNTER — Ambulatory Visit (INDEPENDENT_AMBULATORY_CARE_PROVIDER_SITE_OTHER): Payer: Medicare Other | Admitting: *Deleted

## 2017-10-22 DIAGNOSIS — I255 Ischemic cardiomyopathy: Secondary | ICD-10-CM | POA: Diagnosis not present

## 2017-10-22 DIAGNOSIS — I5022 Chronic systolic (congestive) heart failure: Secondary | ICD-10-CM

## 2017-10-22 NOTE — Progress Notes (Signed)
Remote ICD transmission.   

## 2017-10-23 ENCOUNTER — Encounter: Payer: Self-pay | Admitting: Cardiology

## 2017-10-23 DIAGNOSIS — I2510 Atherosclerotic heart disease of native coronary artery without angina pectoris: Secondary | ICD-10-CM | POA: Diagnosis not present

## 2017-10-23 DIAGNOSIS — I130 Hypertensive heart and chronic kidney disease with heart failure and stage 1 through stage 4 chronic kidney disease, or unspecified chronic kidney disease: Secondary | ICD-10-CM | POA: Diagnosis not present

## 2017-10-23 DIAGNOSIS — I5043 Acute on chronic combined systolic (congestive) and diastolic (congestive) heart failure: Secondary | ICD-10-CM | POA: Diagnosis not present

## 2017-10-23 DIAGNOSIS — N184 Chronic kidney disease, stage 4 (severe): Secondary | ICD-10-CM | POA: Diagnosis not present

## 2017-10-23 DIAGNOSIS — J449 Chronic obstructive pulmonary disease, unspecified: Secondary | ICD-10-CM | POA: Diagnosis not present

## 2017-10-23 DIAGNOSIS — D631 Anemia in chronic kidney disease: Secondary | ICD-10-CM | POA: Diagnosis not present

## 2017-10-24 ENCOUNTER — Ambulatory Visit (HOSPITAL_COMMUNITY)
Admit: 2017-10-24 | Discharge: 2017-10-24 | Disposition: A | Discharge status: Home / Self Care | Payer: Medicare Other | Attending: Cardiology | Admitting: Cardiology

## 2017-10-24 ENCOUNTER — Telehealth (HOSPITAL_COMMUNITY): Payer: Self-pay | Admitting: Cardiology

## 2017-10-24 ENCOUNTER — Encounter (HOSPITAL_COMMUNITY): Payer: Self-pay

## 2017-10-24 VITALS — BP 118/66 | HR 65 | Wt 181.8 lb

## 2017-10-24 DIAGNOSIS — G4730 Sleep apnea, unspecified: Secondary | ICD-10-CM | POA: Insufficient documentation

## 2017-10-24 DIAGNOSIS — E039 Hypothyroidism, unspecified: Secondary | ICD-10-CM | POA: Diagnosis not present

## 2017-10-24 DIAGNOSIS — I480 Paroxysmal atrial fibrillation: Secondary | ICD-10-CM | POA: Diagnosis not present

## 2017-10-24 DIAGNOSIS — N400 Enlarged prostate without lower urinary tract symptoms: Secondary | ICD-10-CM | POA: Insufficient documentation

## 2017-10-24 DIAGNOSIS — K219 Gastro-esophageal reflux disease without esophagitis: Secondary | ICD-10-CM | POA: Diagnosis not present

## 2017-10-24 DIAGNOSIS — I130 Hypertensive heart and chronic kidney disease with heart failure and stage 1 through stage 4 chronic kidney disease, or unspecified chronic kidney disease: Secondary | ICD-10-CM | POA: Insufficient documentation

## 2017-10-24 DIAGNOSIS — I252 Old myocardial infarction: Secondary | ICD-10-CM | POA: Insufficient documentation

## 2017-10-24 DIAGNOSIS — I255 Ischemic cardiomyopathy: Secondary | ICD-10-CM | POA: Insufficient documentation

## 2017-10-24 DIAGNOSIS — Z85828 Personal history of other malignant neoplasm of skin: Secondary | ICD-10-CM | POA: Diagnosis not present

## 2017-10-24 DIAGNOSIS — N184 Chronic kidney disease, stage 4 (severe): Secondary | ICD-10-CM | POA: Insufficient documentation

## 2017-10-24 DIAGNOSIS — I2510 Atherosclerotic heart disease of native coronary artery without angina pectoris: Secondary | ICD-10-CM

## 2017-10-24 DIAGNOSIS — Z955 Presence of coronary angioplasty implant and graft: Secondary | ICD-10-CM | POA: Insufficient documentation

## 2017-10-24 DIAGNOSIS — Z7901 Long term (current) use of anticoagulants: Secondary | ICD-10-CM | POA: Diagnosis not present

## 2017-10-24 DIAGNOSIS — G250 Essential tremor: Secondary | ICD-10-CM | POA: Diagnosis not present

## 2017-10-24 DIAGNOSIS — I5022 Chronic systolic (congestive) heart failure: Secondary | ICD-10-CM | POA: Diagnosis not present

## 2017-10-24 DIAGNOSIS — E785 Hyperlipidemia, unspecified: Secondary | ICD-10-CM | POA: Diagnosis not present

## 2017-10-24 DIAGNOSIS — Z8551 Personal history of malignant neoplasm of bladder: Secondary | ICD-10-CM | POA: Diagnosis not present

## 2017-10-24 DIAGNOSIS — Z79899 Other long term (current) drug therapy: Secondary | ICD-10-CM | POA: Diagnosis not present

## 2017-10-24 LAB — BASIC METABOLIC PANEL
Anion gap: 7 (ref 5–15)
BUN: 38 mg/dL — AB (ref 6–20)
CHLORIDE: 105 mmol/L (ref 101–111)
CO2: 28 mmol/L (ref 22–32)
CREATININE: 2.94 mg/dL — AB (ref 0.61–1.24)
Calcium: 9.2 mg/dL (ref 8.9–10.3)
GFR calc Af Amer: 21 mL/min — ABNORMAL LOW (ref >60)
GFR calc non Af Amer: 18 mL/min — ABNORMAL LOW (ref >60)
Glucose, Bld: 113 mg/dL — ABNORMAL HIGH (ref 65–99)
Potassium: 5 mmol/L (ref 3.5–5.1)
SODIUM: 140 mmol/L (ref 135–145)

## 2017-10-24 NOTE — Telephone Encounter (Signed)
Patient aware and voiced understanding

## 2017-10-24 NOTE — Patient Instructions (Signed)
Labs today  Your physician recommends that you schedule a follow-up appointment in: 4-6 weeks

## 2017-10-24 NOTE — Progress Notes (Signed)
Patient ID: [name], male   DOB: [birth year], 84 y.o.   MRN: [id number]    Advanced Heart Failure Clinic Note   PCP: Virgina Jock  HPI: [name] is a 84 y.o. male with a history of coronary artery disease, status post previous large anterior wall myocardial infarction s/p multivessel stenting, systolic CHF with EF 61% s/p St. Jude BiV ICD.  Remainder of his medical history is notable for atrial fibrillation,maintaining sinus rhythm on amiodarone.  He is not on Coumadin secondary to GI bleed, chronic renal insufficiency with baseline creatinine about 2.2-2.5, hypertension, hyperlipidemia, and a systemic tremor.Bladder cancer 05/2012. Completed BCG treatment.   2015 found that his left ventricular lead has not been capturing suggestive of either fracture of the proximal electrode or an issue at the header. They reprogrammed the device to use to the RV coil which he tolerated well and had a good threshold.   He was admitted in 11/17 for respiratory virus.  Admitted 12/30 - 10/15/17 with A/C CHF in setting of URI. Thought to have mild CHF, so given IV diuresis in house with rapid improvement.   Pt presents today for regular follow up. Has been doing well. Hasn't been very active due to the weather these past two weeks, but overall feeling better. Still a bit weak. Denies SOB walking around the house or with ADLs. Has no problem getting dressed or bathing.  Taking lasix 40 mg daily and has not needed extra. No CP. URI symptoms have resolved. Taking all medication as directed.  He denies lightheadedness or dizziness.    ICD: Personally reviewed in clinic. No VT/VF. No AF alerts. Thoracic impedence well above threshold but normalizing. No printout of pt activity on todays report.   Echo 12/14 LVEF 20-25% Carotid u/s 3/14: 40-59% R 0-39%%L Echo today shows: EF 25%   Review of systems complete and found to be negative unless listed in HPI.    Past Medical History:  Diagnosis Date  . Acute  bronchitis with COPD ([place]) 09/10/2016  . Anemia   . Atrial fibrillation or flutter    maintaining sinus on amiodarone    . Bladder cancer ([place]) dx'd 05/2012  . BPH (benign prostatic hyperplasia)   . CAD (coronary artery disease)     a. s/p anterior MI 12/05 c/b shock -> stent LAD   b. s/p stenting OM-1, 2/06  . CHF (congestive heart failure) ([facility])    due to ischemic CM  a. EF 20-30%. (Nov 2008)   b. s/p St. Jude BiV-ICD    c. CPX 07/2008  pvo2 16.3 (63% predicted) slope 34 RER 1.08 O2 pulse 93%  . Cough    occasional /productive, no fever/ not new  . CRI (chronic renal insufficiency)    (baseline 2.0-2.2)/ recent hospitalization 8/13  . GERD (gastroesophageal reflux disease)    hx Barretts esophagitis  . History of blood transfusion    following coumadin  usage  . HTN (hypertension)    EKG 05/14/12,Chest x ray 8/13, last ICD interrogation 8/13 EPIC  . Hyperlipidemia   . Hypothyroidism   . 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 Skin cancer    basal cells   facial x 4, 1 right forearm  . Sleep apnea    STOP BANG SCORE 4    Current Outpatient Medications  Medication Sig Dispense Refill  . acetaminophen (  TYLENOL) 325 MG tablet Take 2 tablets (650 mg total) by mouth every 6 (six) hours as needed for mild pain (or Fever >/= 101). (Patient taking differently: Take 1,000 mg by mouth every 6 (six) hours as needed for mild pain or headache. )    . amiodarone (PACERONE) 100 MG tablet Take 2 tablets (200 mg total) by mouth daily. 30 tablet 0  . apixaban (ELIQUIS) 2.5 MG TABS tablet Take 1 tablet (2.5 mg total) by mouth 2 (two) times daily. Needs office visit (Patient taking differently: Take 2.5 mg by mouth 2 (two) times daily. ) 60 tablet 3  . carvedilol (COREG) 6.25 MG tablet Take 1 tablet (6.25 mg total) by mouth 2 (two) times daily with a meal. NEEDS FOLLOW UP 06/2017  (Patient taking differently: Take 6.25 mg by mouth 2 (two) times daily with a meal. ) 60 tablet 3  . diphenhydramine-acetaminophen (TYLENOL PM) 25-500 MG TABS tablet Take 1 tablet by mouth at bedtime as needed (sleep).    . ezetimibe (ZETIA) 10 MG tablet TAKE ONE TABLET BY MOUTH ONCE DAILY WITH SUPPER (Patient taking differently: TAKE ONE TABLET (10mg ) BY MOUTH ONCE DAILY WITH SUPPER) 90 tablet 3  . ferrous sulfate ([employer] IRON) 325 (65 FE) MG tablet Take 325 mg by mouth daily with supper.     . furosemide (LASIX) 20 MG tablet Take 2 tablets (40 mg total) by mouth daily. 60 tablet 0  . hydrOXYzine (ATARAX/VISTARIL) 25 MG tablet Take 1 tablet by mouth at bedtime as needed for itching (legs.). Reported on 10/19/2015    . levothyroxine (SYNTHROID, LEVOTHROID) 50 MCG tablet Take 25-50 mcg by mouth daily before breakfast. Take 50 mcg (1 tablet) on Tues, Thurs, Sat, Sun and 25 mcg (1/2 tablet) on Mon, Wed, Fri    . Magnesium Hydroxide (MILK OF MAGNESIA PO) Take 20 mLs by mouth daily as needed (for constipation).    . Multiple Vitamins-Minerals (CENTRUM SILVER PO) Take 1 tablet by mouth at bedtime.     . nitroGLYCERIN (NITROSTAT) 0.4 MG SL tablet DISSOLVE ONE TABLET UNDER THE TONGUE EVERY 5 MINUTES AS NEEDED FOR CHEST PAIN.  DO NOT EXCEED A TOTAL OF 3 DOSES IN 15 MINUTES 25 tablet 0  . pantoprazole (PROTONIX) 40 MG tablet Take 40 mg by mouth daily.     . potassium chloride (KLOR-CON M10) 10 MEQ tablet Take 2 tablets (20 mEq total) by mouth daily. 30 tablet 0  . Pumpkin Seed-Soy Germ (AZO BLADDER CONTROL/GO-LESS) CAPS Take 1 capsule by mouth daily as needed (for bladder incontinence).     . rosuvastatin (CRESTOR) 20 MG tablet Take 1 tablet (20 mg total) by mouth daily. (Patient taking differently: Take 20 mg by mouth at bedtime. ) 90 tablet 3  . sodium chloride (OCEAN) 0.65 % SOLN nasal spray Place 1-2 sprays into both nostrils daily as needed for congestion.     . tamsulosin (FLOMAX) 0.4 MG CAPS capsule Take 0.4  mg by mouth at bedtime.      No current facility-administered medications for this encounter.    PHYSICAL EXAM: Vitals:   10/24/17 1015  BP: 118/66  Pulse: 65  SpO2: 99%  Weight: 181 lb 12.8 oz (82.5 kg)   Wt Readings from Last 3 Encounters:  10/24/17 181 lb 12.8 oz (82.5 kg)  10/15/17 178 lb 9.2 oz (81 kg)  05/09/17 190 lb (86.2 kg)    General:  General: Elderly. NAD. Pale HEENT: Normal Neck: Supple. JVP 6-7 cm. Carotids 2+ bilat; no bruits.  No thyromegaly or nodule noted. Cor: PMI laterally displaced. RRR, 2/6 SEM murmur at apex. ICD site OK.  Lungs: CTAB, normal effort. No wheeze Abdomen: Soft, non-tender, non-distended, no HSM. No bruits or masses. +BS  Extremities: No cyanosis, clubbing, or rash. + Varicose veins. No peripheral edema.  Neuro: Alert & orientedx3, cranial nerves grossly intact. moves all 4 extremities w/o difficulty. Affect pleasant   EKG: AV dual paced rhythm 60 bpm, personally reviewed.   ASSESSMENT & PLAN: 1. Chronic systolic CHF. Echo 10/13/2017 LVEF 20-25% (Stable from last year) - NYHA II-III symptoms stable.  - Volume status stable on exam and corevue. No VT/AF.  - Continue lasix 40 mg daily. Can decrease or take extra as needed.  - Will not add valsartan back for now with mild lightheadedness this am and CKD IV. BMET today to consider - Continue coreg 6.25 mg BID.  2. CAD - No s/s of ischemia. Off ASA due to Eliquis.  - Continue statin. Lipids followed by Dr. Virgina Jock.  3. PAF  - EKG today shows NSR, AV paced.  - Continue amiodarone 100 mg daily.  - Denies bleeding. Eliquis 2.5 mg BID.  4. Carotid ultrasound  - 1-39% bilaterally on last scan 3/15. No repeat warranted. No change.  5. CKD, Stage IV  - BMET today. Se[facility] at [facility].  6. Essential Tremors  - Followed by PCP. Had a neurologic work up and negative for [employer].   No change.   Doing well overall. Thoracic impedence quite high, but normalizing on Corevue.   Continue lasix as above for now. We talked about cutting back as needed.   Shirley Friar, PA-C  10:22 AM  Patient seen and examined with the above-signed Advanced Practice Provider and/or Housestaff. I personally reviewed laboratory data, imaging studies and relevant notes. I independently examined the patient and formulated the important aspects of the plan. I have edited the note to reflect any of my changes or salient points. I have personally discussed the plan with the patient and/or family.  Here for post hospital f/u after treatment for URI. Looks weak but seems to be improving. Volume status looks ok.   Agree with holding ARB at this time with CKD.   ICD interrogated personally NO VT/AF  Corevue very dry. But improving.   Glori Bickers, MD  4:09 PM

## 2017-10-24 NOTE — Telephone Encounter (Signed)
-----   Message from Shirley Friar, PA-C sent at 10/24/2017  1:29 PM EST ----- Stop potassium.    Repeat 7-10 days.    Legrand Como [address]" Z[place], PA-C 10/24/2017 1:29 PM

## 2017-10-27 LAB — CUP PACEART REMOTE DEVICE CHECK
Battery Remaining Longevity: 23 mo
Battery Remaining Longevity: 23 mo
Battery Remaining Percentage: 35 %
Battery Voltage: 2.92 V
Battery Voltage: 2.92 V
Brady Statistic AP VP Percent: 95 %
Brady Statistic AS VS Percent: 1 %
Brady Statistic RA Percent Paced: 95 %
Brady Statistic RA Percent Paced: 95 %
Date Time Interrogation Session: 20190109083502
HIGH POWER IMPEDANCE MEASURED VALUE: 50 Ohm
HIGH POWER IMPEDANCE MEASURED VALUE: 50 Ohm
HIGH POWER IMPEDANCE MEASURED VALUE: 47 Ohm
HIGH POWER IMPEDANCE MEASURED VALUE: 47 Ohm
Implantable Lead Implant Date: 20060509
Implantable Lead Implant Date: 20060509
Implantable Lead Implant Date: 20060509
Implantable Lead Implant Date: 20090417
Implantable Lead Location: 753858
Implantable Lead Location: 753860
Implantable Lead Location: 753858
Implantable Lead Location: 753860
Implantable Lead Model: 7001
Implantable Lead Model: 5076
Implantable Lead Model: 7001
Implantable Pulse Generator Implant Date: 20141208
Implantable Pulse Generator Implant Date: 20141208
Lead Channel Impedance Value: 390 Ohm
Lead Channel Impedance Value: 440 Ohm
Lead Channel Impedance Value: 440 Ohm
Lead Channel Pacing Threshold Amplitude: 1 V
Lead Channel Pacing Threshold Amplitude: 1.25 V
Lead Channel Pacing Threshold Amplitude: 1 V
Lead Channel Pacing Threshold Amplitude: 1.25 V
Lead Channel Pacing Threshold Pulse Width: 0.8 ms
Lead Channel Pacing Threshold Pulse Width: 0.8 ms
Lead Channel Pacing Threshold Pulse Width: 0.5 ms
Lead Channel Sensing Intrinsic Amplitude: 12 mV
Lead Channel Sensing Intrinsic Amplitude: 12 mV
Lead Channel Setting Pacing Amplitude: 2 V
Lead Channel Setting Sensing Sensitivity: 0.5 mV
Lead Channel Setting Sensing Sensitivity: 0.5 mV
MDC IDC LEAD IMPLANT DT: 20090417
MDC IDC LEAD IMPLANT DT: 20060509
MDC IDC LEAD LOCATION: 753859
MDC IDC LEAD LOCATION: 753859
MDC IDC MSMT BATTERY REMAINING PERCENTAGE: 34 %
MDC IDC MSMT LEADCHNL LV PACING THRESHOLD PULSEWIDTH: 0.8 ms
MDC IDC MSMT LEADCHNL RA IMPEDANCE VALUE: 400 Ohm
MDC IDC MSMT LEADCHNL RA IMPEDANCE VALUE: 400 Ohm
MDC IDC MSMT LEADCHNL RA PACING THRESHOLD AMPLITUDE: 0.75 V
MDC IDC MSMT LEADCHNL RA PACING THRESHOLD AMPLITUDE: 0.75 V
MDC IDC MSMT LEADCHNL RA PACING THRESHOLD PULSEWIDTH: 0.5 ms
MDC IDC MSMT LEADCHNL RA SENSING INTR AMPL: 4.4 mV
MDC IDC MSMT LEADCHNL RA SENSING INTR AMPL: 4.2 mV
MDC IDC MSMT LEADCHNL RV IMPEDANCE VALUE: 400 Ohm
MDC IDC MSMT LEADCHNL RV PACING THRESHOLD PULSEWIDTH: 0.8 ms
MDC IDC PG SERIAL: 7138995
MDC IDC PG SERIAL: 7138995
MDC IDC SESS DTM: 20190111151219
MDC IDC SET LEADCHNL LV PACING AMPLITUDE: 2 V
MDC IDC SET LEADCHNL LV PACING PULSEWIDTH: 0.8 ms
MDC IDC SET LEADCHNL LV PACING PULSEWIDTH: 0.8 ms
MDC IDC SET LEADCHNL RA PACING AMPLITUDE: 2 V
MDC IDC SET LEADCHNL RA PACING AMPLITUDE: 2 V
MDC IDC SET LEADCHNL RV PACING AMPLITUDE: 2.5 V
MDC IDC SET LEADCHNL RV PACING AMPLITUDE: 2.5 V
MDC IDC SET LEADCHNL RV PACING PULSEWIDTH: 0.8 ms
MDC IDC SET LEADCHNL RV PACING PULSEWIDTH: 0.8 ms
MDC IDC STAT BRADY AP VP PERCENT: 95 %
MDC IDC STAT BRADY AP VS PERCENT: 1 %
MDC IDC STAT BRADY AP VS PERCENT: 1 %
MDC IDC STAT BRADY AS VP PERCENT: 4.4 %
MDC IDC STAT BRADY AS VP PERCENT: 4.4 %
MDC IDC STAT BRADY AS VS PERCENT: 1 %

## 2017-10-28 DIAGNOSIS — I130 Hypertensive heart and chronic kidney disease with heart failure and stage 1 through stage 4 chronic kidney disease, or unspecified chronic kidney disease: Secondary | ICD-10-CM | POA: Diagnosis not present

## 2017-10-28 DIAGNOSIS — I2510 Atherosclerotic heart disease of native coronary artery without angina pectoris: Secondary | ICD-10-CM | POA: Diagnosis not present

## 2017-10-28 DIAGNOSIS — N184 Chronic kidney disease, stage 4 (severe): Secondary | ICD-10-CM | POA: Diagnosis not present

## 2017-10-28 DIAGNOSIS — D631 Anemia in chronic kidney disease: Secondary | ICD-10-CM | POA: Diagnosis not present

## 2017-10-28 DIAGNOSIS — J449 Chronic obstructive pulmonary disease, unspecified: Secondary | ICD-10-CM | POA: Diagnosis not present

## 2017-10-28 DIAGNOSIS — I5043 Acute on chronic combined systolic (congestive) and diastolic (congestive) heart failure: Secondary | ICD-10-CM | POA: Diagnosis not present

## 2017-10-29 ENCOUNTER — Other Ambulatory Visit (HOSPITAL_COMMUNITY): Payer: Self-pay | Admitting: Internal Medicine

## 2017-10-29 ENCOUNTER — Other Ambulatory Visit (HOSPITAL_COMMUNITY): Payer: Self-pay | Admitting: Cardiology

## 2017-10-29 DIAGNOSIS — I2510 Atherosclerotic heart disease of native coronary artery without angina pectoris: Secondary | ICD-10-CM | POA: Diagnosis not present

## 2017-10-29 DIAGNOSIS — I5043 Acute on chronic combined systolic (congestive) and diastolic (congestive) heart failure: Secondary | ICD-10-CM | POA: Diagnosis not present

## 2017-10-29 DIAGNOSIS — I130 Hypertensive heart and chronic kidney disease with heart failure and stage 1 through stage 4 chronic kidney disease, or unspecified chronic kidney disease: Secondary | ICD-10-CM | POA: Diagnosis not present

## 2017-10-29 DIAGNOSIS — D631 Anemia in chronic kidney disease: Secondary | ICD-10-CM | POA: Diagnosis not present

## 2017-10-29 DIAGNOSIS — N184 Chronic kidney disease, stage 4 (severe): Secondary | ICD-10-CM | POA: Diagnosis not present

## 2017-10-29 DIAGNOSIS — J449 Chronic obstructive pulmonary disease, unspecified: Secondary | ICD-10-CM | POA: Diagnosis not present

## 2017-10-31 DIAGNOSIS — J449 Chronic obstructive pulmonary disease, unspecified: Secondary | ICD-10-CM | POA: Diagnosis not present

## 2017-10-31 DIAGNOSIS — I5043 Acute on chronic combined systolic (congestive) and diastolic (congestive) heart failure: Secondary | ICD-10-CM | POA: Diagnosis not present

## 2017-10-31 DIAGNOSIS — I130 Hypertensive heart and chronic kidney disease with heart failure and stage 1 through stage 4 chronic kidney disease, or unspecified chronic kidney disease: Secondary | ICD-10-CM | POA: Diagnosis not present

## 2017-10-31 DIAGNOSIS — D631 Anemia in chronic kidney disease: Secondary | ICD-10-CM | POA: Diagnosis not present

## 2017-10-31 DIAGNOSIS — N184 Chronic kidney disease, stage 4 (severe): Secondary | ICD-10-CM | POA: Diagnosis not present

## 2017-10-31 DIAGNOSIS — I2510 Atherosclerotic heart disease of native coronary artery without angina pectoris: Secondary | ICD-10-CM | POA: Diagnosis not present

## 2017-11-03 DIAGNOSIS — D631 Anemia in chronic kidney disease: Secondary | ICD-10-CM | POA: Diagnosis not present

## 2017-11-03 DIAGNOSIS — I5043 Acute on chronic combined systolic (congestive) and diastolic (congestive) heart failure: Secondary | ICD-10-CM | POA: Diagnosis not present

## 2017-11-03 DIAGNOSIS — I130 Hypertensive heart and chronic kidney disease with heart failure and stage 1 through stage 4 chronic kidney disease, or unspecified chronic kidney disease: Secondary | ICD-10-CM | POA: Diagnosis not present

## 2017-11-03 DIAGNOSIS — J449 Chronic obstructive pulmonary disease, unspecified: Secondary | ICD-10-CM | POA: Diagnosis not present

## 2017-11-03 DIAGNOSIS — I2510 Atherosclerotic heart disease of native coronary artery without angina pectoris: Secondary | ICD-10-CM | POA: Diagnosis not present

## 2017-11-03 DIAGNOSIS — N184 Chronic kidney disease, stage 4 (severe): Secondary | ICD-10-CM | POA: Diagnosis not present

## 2017-11-04 DIAGNOSIS — N184 Chronic kidney disease, stage 4 (severe): Secondary | ICD-10-CM | POA: Diagnosis not present

## 2017-11-04 DIAGNOSIS — I5043 Acute on chronic combined systolic (congestive) and diastolic (congestive) heart failure: Secondary | ICD-10-CM | POA: Diagnosis not present

## 2017-11-04 DIAGNOSIS — J449 Chronic obstructive pulmonary disease, unspecified: Secondary | ICD-10-CM | POA: Diagnosis not present

## 2017-11-04 DIAGNOSIS — I2510 Atherosclerotic heart disease of native coronary artery without angina pectoris: Secondary | ICD-10-CM | POA: Diagnosis not present

## 2017-11-04 DIAGNOSIS — I130 Hypertensive heart and chronic kidney disease with heart failure and stage 1 through stage 4 chronic kidney disease, or unspecified chronic kidney disease: Secondary | ICD-10-CM | POA: Diagnosis not present

## 2017-11-04 DIAGNOSIS — D631 Anemia in chronic kidney disease: Secondary | ICD-10-CM | POA: Diagnosis not present

## 2017-11-05 DIAGNOSIS — J449 Chronic obstructive pulmonary disease, unspecified: Secondary | ICD-10-CM | POA: Diagnosis not present

## 2017-11-05 DIAGNOSIS — I130 Hypertensive heart and chronic kidney disease with heart failure and stage 1 through stage 4 chronic kidney disease, or unspecified chronic kidney disease: Secondary | ICD-10-CM | POA: Diagnosis not present

## 2017-11-05 DIAGNOSIS — I5043 Acute on chronic combined systolic (congestive) and diastolic (congestive) heart failure: Secondary | ICD-10-CM | POA: Diagnosis not present

## 2017-11-05 DIAGNOSIS — I2510 Atherosclerotic heart disease of native coronary artery without angina pectoris: Secondary | ICD-10-CM | POA: Diagnosis not present

## 2017-11-05 DIAGNOSIS — N184 Chronic kidney disease, stage 4 (severe): Secondary | ICD-10-CM | POA: Diagnosis not present

## 2017-11-05 DIAGNOSIS — D631 Anemia in chronic kidney disease: Secondary | ICD-10-CM | POA: Diagnosis not present

## 2017-11-10 DIAGNOSIS — I130 Hypertensive heart and chronic kidney disease with heart failure and stage 1 through stage 4 chronic kidney disease, or unspecified chronic kidney disease: Secondary | ICD-10-CM | POA: Diagnosis not present

## 2017-11-10 DIAGNOSIS — I2510 Atherosclerotic heart disease of native coronary artery without angina pectoris: Secondary | ICD-10-CM | POA: Diagnosis not present

## 2017-11-10 DIAGNOSIS — J449 Chronic obstructive pulmonary disease, unspecified: Secondary | ICD-10-CM | POA: Diagnosis not present

## 2017-11-10 DIAGNOSIS — I5043 Acute on chronic combined systolic (congestive) and diastolic (congestive) heart failure: Secondary | ICD-10-CM | POA: Diagnosis not present

## 2017-11-10 DIAGNOSIS — D631 Anemia in chronic kidney disease: Secondary | ICD-10-CM | POA: Diagnosis not present

## 2017-11-10 DIAGNOSIS — N184 Chronic kidney disease, stage 4 (severe): Secondary | ICD-10-CM | POA: Diagnosis not present

## 2017-11-11 DIAGNOSIS — I5043 Acute on chronic combined systolic (congestive) and diastolic (congestive) heart failure: Secondary | ICD-10-CM | POA: Diagnosis not present

## 2017-11-11 DIAGNOSIS — N184 Chronic kidney disease, stage 4 (severe): Secondary | ICD-10-CM | POA: Diagnosis not present

## 2017-11-11 DIAGNOSIS — I130 Hypertensive heart and chronic kidney disease with heart failure and stage 1 through stage 4 chronic kidney disease, or unspecified chronic kidney disease: Secondary | ICD-10-CM | POA: Diagnosis not present

## 2017-11-11 DIAGNOSIS — D631 Anemia in chronic kidney disease: Secondary | ICD-10-CM | POA: Diagnosis not present

## 2017-11-11 DIAGNOSIS — I2510 Atherosclerotic heart disease of native coronary artery without angina pectoris: Secondary | ICD-10-CM | POA: Diagnosis not present

## 2017-11-11 DIAGNOSIS — J449 Chronic obstructive pulmonary disease, unspecified: Secondary | ICD-10-CM | POA: Diagnosis not present

## 2017-11-13 DIAGNOSIS — I480 Paroxysmal atrial fibrillation: Secondary | ICD-10-CM | POA: Diagnosis not present

## 2017-11-13 DIAGNOSIS — I255 Ischemic cardiomyopathy: Secondary | ICD-10-CM | POA: Diagnosis not present

## 2017-11-13 DIAGNOSIS — E7849 Other hyperlipidemia: Secondary | ICD-10-CM | POA: Diagnosis not present

## 2017-11-13 DIAGNOSIS — Z6826 Body mass index (BMI) 26.0-26.9, adult: Secondary | ICD-10-CM | POA: Diagnosis not present

## 2017-11-13 DIAGNOSIS — I2510 Atherosclerotic heart disease of native coronary artery without angina pectoris: Secondary | ICD-10-CM | POA: Diagnosis not present

## 2017-11-13 DIAGNOSIS — D631 Anemia in chronic kidney disease: Secondary | ICD-10-CM | POA: Diagnosis not present

## 2017-11-13 DIAGNOSIS — N184 Chronic kidney disease, stage 4 (severe): Secondary | ICD-10-CM | POA: Diagnosis not present

## 2017-11-13 DIAGNOSIS — D6489 Other specified anemias: Secondary | ICD-10-CM | POA: Diagnosis not present

## 2017-11-13 DIAGNOSIS — E038 Other specified hypothyroidism: Secondary | ICD-10-CM | POA: Diagnosis not present

## 2017-11-13 DIAGNOSIS — I10 Essential (primary) hypertension: Secondary | ICD-10-CM | POA: Diagnosis not present

## 2017-11-13 DIAGNOSIS — I509 Heart failure, unspecified: Secondary | ICD-10-CM | POA: Diagnosis not present

## 2017-11-13 DIAGNOSIS — J449 Chronic obstructive pulmonary disease, unspecified: Secondary | ICD-10-CM | POA: Diagnosis not present

## 2017-11-13 DIAGNOSIS — I5043 Acute on chronic combined systolic (congestive) and diastolic (congestive) heart failure: Secondary | ICD-10-CM | POA: Diagnosis not present

## 2017-11-13 DIAGNOSIS — Z950 Presence of cardiac pacemaker: Secondary | ICD-10-CM | POA: Diagnosis not present

## 2017-11-13 DIAGNOSIS — I130 Hypertensive heart and chronic kidney disease with heart failure and stage 1 through stage 4 chronic kidney disease, or unspecified chronic kidney disease: Secondary | ICD-10-CM | POA: Diagnosis not present

## 2017-11-17 DIAGNOSIS — I2510 Atherosclerotic heart disease of native coronary artery without angina pectoris: Secondary | ICD-10-CM | POA: Diagnosis not present

## 2017-11-17 DIAGNOSIS — N184 Chronic kidney disease, stage 4 (severe): Secondary | ICD-10-CM | POA: Diagnosis not present

## 2017-11-17 DIAGNOSIS — J449 Chronic obstructive pulmonary disease, unspecified: Secondary | ICD-10-CM | POA: Diagnosis not present

## 2017-11-17 DIAGNOSIS — D631 Anemia in chronic kidney disease: Secondary | ICD-10-CM | POA: Diagnosis not present

## 2017-11-17 DIAGNOSIS — I5043 Acute on chronic combined systolic (congestive) and diastolic (congestive) heart failure: Secondary | ICD-10-CM | POA: Diagnosis not present

## 2017-11-17 DIAGNOSIS — I130 Hypertensive heart and chronic kidney disease with heart failure and stage 1 through stage 4 chronic kidney disease, or unspecified chronic kidney disease: Secondary | ICD-10-CM | POA: Diagnosis not present

## 2017-11-18 DIAGNOSIS — D631 Anemia in chronic kidney disease: Secondary | ICD-10-CM | POA: Diagnosis not present

## 2017-11-18 DIAGNOSIS — I130 Hypertensive heart and chronic kidney disease with heart failure and stage 1 through stage 4 chronic kidney disease, or unspecified chronic kidney disease: Secondary | ICD-10-CM | POA: Diagnosis not present

## 2017-11-18 DIAGNOSIS — J449 Chronic obstructive pulmonary disease, unspecified: Secondary | ICD-10-CM | POA: Diagnosis not present

## 2017-11-18 DIAGNOSIS — I5043 Acute on chronic combined systolic (congestive) and diastolic (congestive) heart failure: Secondary | ICD-10-CM | POA: Diagnosis not present

## 2017-11-18 DIAGNOSIS — I2510 Atherosclerotic heart disease of native coronary artery without angina pectoris: Secondary | ICD-10-CM | POA: Diagnosis not present

## 2017-11-18 DIAGNOSIS — N184 Chronic kidney disease, stage 4 (severe): Secondary | ICD-10-CM | POA: Diagnosis not present

## 2017-11-21 DIAGNOSIS — D631 Anemia in chronic kidney disease: Secondary | ICD-10-CM | POA: Diagnosis not present

## 2017-11-21 DIAGNOSIS — I130 Hypertensive heart and chronic kidney disease with heart failure and stage 1 through stage 4 chronic kidney disease, or unspecified chronic kidney disease: Secondary | ICD-10-CM | POA: Diagnosis not present

## 2017-11-21 DIAGNOSIS — N184 Chronic kidney disease, stage 4 (severe): Secondary | ICD-10-CM | POA: Diagnosis not present

## 2017-11-21 DIAGNOSIS — I2510 Atherosclerotic heart disease of native coronary artery without angina pectoris: Secondary | ICD-10-CM | POA: Diagnosis not present

## 2017-11-21 DIAGNOSIS — J449 Chronic obstructive pulmonary disease, unspecified: Secondary | ICD-10-CM | POA: Diagnosis not present

## 2017-11-21 DIAGNOSIS — I5043 Acute on chronic combined systolic (congestive) and diastolic (congestive) heart failure: Secondary | ICD-10-CM | POA: Diagnosis not present

## 2017-11-28 DIAGNOSIS — D631 Anemia in chronic kidney disease: Secondary | ICD-10-CM | POA: Diagnosis not present

## 2017-11-28 DIAGNOSIS — J449 Chronic obstructive pulmonary disease, unspecified: Secondary | ICD-10-CM | POA: Diagnosis not present

## 2017-11-28 DIAGNOSIS — I130 Hypertensive heart and chronic kidney disease with heart failure and stage 1 through stage 4 chronic kidney disease, or unspecified chronic kidney disease: Secondary | ICD-10-CM | POA: Diagnosis not present

## 2017-11-28 DIAGNOSIS — I2510 Atherosclerotic heart disease of native coronary artery without angina pectoris: Secondary | ICD-10-CM | POA: Diagnosis not present

## 2017-11-28 DIAGNOSIS — I5043 Acute on chronic combined systolic (congestive) and diastolic (congestive) heart failure: Secondary | ICD-10-CM | POA: Diagnosis not present

## 2017-11-28 DIAGNOSIS — N184 Chronic kidney disease, stage 4 (severe): Secondary | ICD-10-CM | POA: Diagnosis not present

## 2017-12-04 DIAGNOSIS — I2510 Atherosclerotic heart disease of native coronary artery without angina pectoris: Secondary | ICD-10-CM | POA: Diagnosis not present

## 2017-12-04 DIAGNOSIS — I130 Hypertensive heart and chronic kidney disease with heart failure and stage 1 through stage 4 chronic kidney disease, or unspecified chronic kidney disease: Secondary | ICD-10-CM | POA: Diagnosis not present

## 2017-12-04 DIAGNOSIS — N184 Chronic kidney disease, stage 4 (severe): Secondary | ICD-10-CM | POA: Diagnosis not present

## 2017-12-04 DIAGNOSIS — D631 Anemia in chronic kidney disease: Secondary | ICD-10-CM | POA: Diagnosis not present

## 2017-12-04 DIAGNOSIS — I5043 Acute on chronic combined systolic (congestive) and diastolic (congestive) heart failure: Secondary | ICD-10-CM | POA: Diagnosis not present

## 2017-12-04 DIAGNOSIS — J449 Chronic obstructive pulmonary disease, unspecified: Secondary | ICD-10-CM | POA: Diagnosis not present

## 2017-12-06 ENCOUNTER — Encounter (HOSPITAL_COMMUNITY): Payer: Self-pay

## 2017-12-06 ENCOUNTER — Telehealth: Payer: Self-pay | Admitting: Physician Assistant

## 2017-12-06 ENCOUNTER — Inpatient Hospital Stay (HOSPITAL_COMMUNITY)
Admission: EM | Admit: 2017-12-06 | Discharge: 2017-12-12 | DRG: 872 | Disposition: A | Discharge status: Home Health | Payer: Medicare Other | Attending: Family Medicine | Admitting: Family Medicine

## 2017-12-06 ENCOUNTER — Emergency Department (HOSPITAL_COMMUNITY): Payer: Medicare Other

## 2017-12-06 DIAGNOSIS — R112 Nausea with vomiting, unspecified: Secondary | ICD-10-CM | POA: Diagnosis present

## 2017-12-06 DIAGNOSIS — A4181 Sepsis due to Enterococcus: Principal | ICD-10-CM | POA: Diagnosis present

## 2017-12-06 DIAGNOSIS — G4733 Obstructive sleep apnea (adult) (pediatric): Secondary | ICD-10-CM | POA: Diagnosis present

## 2017-12-06 DIAGNOSIS — I4891 Unspecified atrial fibrillation: Secondary | ICD-10-CM | POA: Diagnosis present

## 2017-12-06 DIAGNOSIS — Z95810 Presence of automatic (implantable) cardiac defibrillator: Secondary | ICD-10-CM | POA: Diagnosis not present

## 2017-12-06 DIAGNOSIS — I2510 Atherosclerotic heart disease of native coronary artery without angina pectoris: Secondary | ICD-10-CM | POA: Diagnosis present

## 2017-12-06 DIAGNOSIS — N179 Acute kidney failure, unspecified: Secondary | ICD-10-CM | POA: Diagnosis not present

## 2017-12-06 DIAGNOSIS — N289 Disorder of kidney and ureter, unspecified: Secondary | ICD-10-CM | POA: Diagnosis not present

## 2017-12-06 DIAGNOSIS — G250 Essential tremor: Secondary | ICD-10-CM | POA: Diagnosis present

## 2017-12-06 DIAGNOSIS — I480 Paroxysmal atrial fibrillation: Secondary | ICD-10-CM | POA: Diagnosis not present

## 2017-12-06 DIAGNOSIS — J9811 Atelectasis: Secondary | ICD-10-CM | POA: Diagnosis not present

## 2017-12-06 DIAGNOSIS — I5022 Chronic systolic (congestive) heart failure: Secondary | ICD-10-CM | POA: Diagnosis present

## 2017-12-06 DIAGNOSIS — I472 Ventricular tachycardia: Secondary | ICD-10-CM | POA: Diagnosis not present

## 2017-12-06 DIAGNOSIS — N400 Enlarged prostate without lower urinary tract symptoms: Secondary | ICD-10-CM | POA: Diagnosis present

## 2017-12-06 DIAGNOSIS — K5730 Diverticulosis of large intestine without perforation or abscess without bleeding: Secondary | ICD-10-CM | POA: Diagnosis not present

## 2017-12-06 DIAGNOSIS — Z85828 Personal history of other malignant neoplasm of skin: Secondary | ICD-10-CM | POA: Diagnosis not present

## 2017-12-06 DIAGNOSIS — I739 Peripheral vascular disease, unspecified: Secondary | ICD-10-CM | POA: Diagnosis present

## 2017-12-06 DIAGNOSIS — I252 Old myocardial infarction: Secondary | ICD-10-CM

## 2017-12-06 DIAGNOSIS — I255 Ischemic cardiomyopathy: Secondary | ICD-10-CM | POA: Diagnosis present

## 2017-12-06 DIAGNOSIS — I509 Heart failure, unspecified: Secondary | ICD-10-CM | POA: Diagnosis not present

## 2017-12-06 DIAGNOSIS — E039 Hypothyroidism, unspecified: Secondary | ICD-10-CM | POA: Diagnosis present

## 2017-12-06 DIAGNOSIS — I130 Hypertensive heart and chronic kidney disease with heart failure and stage 1 through stage 4 chronic kidney disease, or unspecified chronic kidney disease: Secondary | ICD-10-CM | POA: Diagnosis present

## 2017-12-06 DIAGNOSIS — R531 Weakness: Secondary | ICD-10-CM | POA: Diagnosis not present

## 2017-12-06 DIAGNOSIS — J449 Chronic obstructive pulmonary disease, unspecified: Secondary | ICD-10-CM | POA: Diagnosis present

## 2017-12-06 DIAGNOSIS — E785 Hyperlipidemia, unspecified: Secondary | ICD-10-CM | POA: Diagnosis present

## 2017-12-06 DIAGNOSIS — R319 Hematuria, unspecified: Secondary | ICD-10-CM | POA: Diagnosis not present

## 2017-12-06 DIAGNOSIS — E872 Acidosis, unspecified: Secondary | ICD-10-CM

## 2017-12-06 DIAGNOSIS — Z8551 Personal history of malignant neoplasm of bladder: Secondary | ICD-10-CM | POA: Diagnosis not present

## 2017-12-06 DIAGNOSIS — K219 Gastro-esophageal reflux disease without esophagitis: Secondary | ICD-10-CM | POA: Diagnosis present

## 2017-12-06 DIAGNOSIS — I340 Nonrheumatic mitral (valve) insufficiency: Secondary | ICD-10-CM | POA: Diagnosis present

## 2017-12-06 DIAGNOSIS — Z87891 Personal history of nicotine dependence: Secondary | ICD-10-CM | POA: Diagnosis not present

## 2017-12-06 DIAGNOSIS — I080 Rheumatic disorders of both mitral and aortic valves: Secondary | ICD-10-CM | POA: Diagnosis not present

## 2017-12-06 DIAGNOSIS — K449 Diaphragmatic hernia without obstruction or gangrene: Secondary | ICD-10-CM | POA: Diagnosis not present

## 2017-12-06 DIAGNOSIS — N3001 Acute cystitis with hematuria: Secondary | ICD-10-CM

## 2017-12-06 DIAGNOSIS — I351 Nonrheumatic aortic (valve) insufficiency: Secondary | ICD-10-CM | POA: Diagnosis not present

## 2017-12-06 DIAGNOSIS — B952 Enterococcus as the cause of diseases classified elsewhere: Secondary | ICD-10-CM | POA: Diagnosis not present

## 2017-12-06 DIAGNOSIS — R1031 Right lower quadrant pain: Secondary | ICD-10-CM | POA: Diagnosis not present

## 2017-12-06 DIAGNOSIS — Z87440 Personal history of urinary (tract) infections: Secondary | ICD-10-CM | POA: Diagnosis not present

## 2017-12-06 DIAGNOSIS — N184 Chronic kidney disease, stage 4 (severe): Secondary | ICD-10-CM | POA: Diagnosis present

## 2017-12-06 DIAGNOSIS — R7881 Bacteremia: Secondary | ICD-10-CM

## 2017-12-06 DIAGNOSIS — N390 Urinary tract infection, site not specified: Secondary | ICD-10-CM | POA: Diagnosis present

## 2017-12-06 DIAGNOSIS — R000 Tachycardia, unspecified: Secondary | ICD-10-CM | POA: Diagnosis not present

## 2017-12-06 DIAGNOSIS — E860 Dehydration: Secondary | ICD-10-CM | POA: Diagnosis present

## 2017-12-06 DIAGNOSIS — Z955 Presence of coronary angioplasty implant and graft: Secondary | ICD-10-CM | POA: Diagnosis not present

## 2017-12-06 DIAGNOSIS — Z7901 Long term (current) use of anticoagulants: Secondary | ICD-10-CM | POA: Diagnosis not present

## 2017-12-06 LAB — I-STAT VENOUS BLOOD GAS, ED
Acid-Base Excess: 4 mmol/L — ABNORMAL HIGH (ref 0.0–2.0)
Bicarbonate: 29.2 mmol/L — ABNORMAL HIGH (ref 20.0–28.0)
O2 SAT: 37 %
PCO2 VEN: 46.4 mmHg (ref 44.0–60.0)
TCO2: 31 mmol/L (ref 22–32)
pH, Ven: 7.406 (ref 7.250–7.430)
pO2, Ven: 22 mmHg — CL (ref 32.0–45.0)

## 2017-12-06 LAB — BASIC METABOLIC PANEL
Anion gap: 22 — ABNORMAL HIGH (ref 5–15)
Anion gap: 14 (ref 5–15)
BUN: 33 mg/dL — AB (ref 6–20)
BUN: 33 mg/dL — AB (ref 6–20)
CALCIUM: 8.6 mg/dL — AB (ref 8.9–10.3)
CHLORIDE: 99 mmol/L — AB (ref 101–111)
CO2: 21 mmol/L — AB (ref 22–32)
CO2: 22 mmol/L (ref 22–32)
Calcium: 8.2 mg/dL — ABNORMAL LOW (ref 8.9–10.3)
Chloride: 106 mmol/L (ref 101–111)
Creatinine, Ser: 2.57 mg/dL — ABNORMAL HIGH (ref 0.61–1.24)
Creatinine, Ser: 2.68 mg/dL — ABNORMAL HIGH (ref 0.61–1.24)
GFR calc Af Amer: 25 mL/min — ABNORMAL LOW (ref >60)
GFR calc non Af Amer: 21 mL/min — ABNORMAL LOW (ref >60)
GFR calc non Af Amer: 20 mL/min — ABNORMAL LOW (ref >60)
GFR, EST AFRICAN AMERICAN: 24 mL/min — AB (ref >60)
GLUCOSE: 139 mg/dL — AB (ref 65–99)
Glucose, Bld: 125 mg/dL — ABNORMAL HIGH (ref 65–99)
POTASSIUM: 3.3 mmol/L — AB (ref 3.5–5.1)
Potassium: 4.2 mmol/L (ref 3.5–5.1)
SODIUM: 142 mmol/L (ref 135–145)
Sodium: 142 mmol/L (ref 135–145)

## 2017-12-06 LAB — URINALYSIS, ROUTINE W REFLEX MICROSCOPIC
Bilirubin Urine: NEGATIVE
Glucose, UA: NEGATIVE mg/dL
KETONES UR: NEGATIVE mg/dL
LEUKOCYTES UA: NEGATIVE
Nitrite: NEGATIVE
PROTEIN: NEGATIVE mg/dL
Specific Gravity, Urine: 1.01 (ref 1.005–1.030)
pH: 5 (ref 5.0–8.0)

## 2017-12-06 LAB — CBC
HEMATOCRIT: 40.5 % (ref 39.0–52.0)
Hemoglobin: 12.8 g/dL — ABNORMAL LOW (ref 13.0–17.0)
MCH: 29.6 pg (ref 26.0–34.0)
MCHC: 31.6 g/dL (ref 30.0–36.0)
MCV: 93.8 fL (ref 78.0–100.0)
Platelets: 178 10*3/uL (ref 150–400)
RBC: 4.32 MIL/uL (ref 4.22–5.81)
RDW: 14.2 % (ref 11.5–15.5)
WBC: 11.4 10*3/uL — AB (ref 4.0–10.5)

## 2017-12-06 LAB — I-STAT TROPONIN, ED: Troponin i, poc: 0 ng/mL (ref 0.00–0.08)

## 2017-12-06 LAB — BRAIN NATRIURETIC PEPTIDE: B NATRIURETIC PEPTIDE 5: 400.9 pg/mL — AB (ref 0.0–100.0)

## 2017-12-06 LAB — HEPATIC FUNCTION PANEL
ALBUMIN: 3.3 g/dL — AB (ref 3.5–5.0)
ALT: 16 U/L — ABNORMAL LOW (ref 17–63)
AST: 53 U/L — ABNORMAL HIGH (ref 15–41)
Alkaline Phosphatase: 57 U/L (ref 38–126)
Bilirubin, Direct: 0.2 mg/dL (ref 0.1–0.5)
Indirect Bilirubin: 0.4 mg/dL (ref 0.3–0.9)
TOTAL PROTEIN: 6.6 g/dL (ref 6.5–8.1)
Total Bilirubin: 0.6 mg/dL (ref 0.3–1.2)

## 2017-12-06 LAB — I-STAT CG4 LACTIC ACID, ED
LACTIC ACID, VENOUS: 1.33 mmol/L (ref 0.5–1.9)
LACTIC ACID, VENOUS: 3.33 mmol/L — AB (ref 0.5–1.9)

## 2017-12-06 LAB — INFLUENZA PANEL BY PCR (TYPE A & B)
Influenza A By PCR: NEGATIVE
Influenza B By PCR: NEGATIVE

## 2017-12-06 LAB — PROTIME-INR
INR: 1.17
Prothrombin Time: 14.8 seconds (ref 11.4–15.2)

## 2017-12-06 LAB — CBG MONITORING, ED: Glucose-Capillary: 121 mg/dL — ABNORMAL HIGH (ref 65–99)

## 2017-12-06 LAB — LIPASE, BLOOD: LIPASE: 28 U/L (ref 11–51)

## 2017-12-06 MED ORDER — SODIUM CHLORIDE 0.9 % IV BOLUS (SEPSIS)
500.0000 mL | Freq: Once | INTRAVENOUS | Status: AC
  Administered 2017-12-06: 500 mL via INTRAVENOUS

## 2017-12-06 MED ORDER — ONDANSETRON HCL 4 MG/2ML IJ SOLN
4.0000 mg | Freq: Once | INTRAMUSCULAR | Status: AC
  Administered 2017-12-06: 4 mg via INTRAVENOUS
  Filled 2017-12-06: qty 2

## 2017-12-06 MED ORDER — METOCLOPRAMIDE HCL 5 MG/ML IJ SOLN
10.0000 mg | Freq: Once | INTRAMUSCULAR | Status: AC
  Administered 2017-12-06: 10 mg via INTRAVENOUS
  Filled 2017-12-06: qty 2

## 2017-12-06 NOTE — ED Notes (Signed)
Pt in xray

## 2017-12-06 NOTE — ED Notes (Signed)
Pt given diet coke, ok by Dr Laverta Baltimore

## 2017-12-06 NOTE — ED Triage Notes (Signed)
Pt from home with [facility] c.o generalized weakness that started about 2 hours ago, pt also c.o nausea and vomiting x6. Pt has hx of a fib with a pacemaker and hx of CHF, pt was here in dec of last year in which they doubled his lasix dosage. Pt has abd distention and incontinence. Lung sounds clear, pt a.o VSS.

## 2017-12-06 NOTE — ED Notes (Signed)
ED Provider at bedside. 

## 2017-12-06 NOTE — Telephone Encounter (Signed)
Paged by answering service, patient is not feeling well since this morning.  He is nauseated, weak and having frequent urination.  Pulse of 110.  Pulse oximeter shows oxygen saturation of 85%.  No fever but he has cough, congestion and shakiness.  He also looks pale and yellowish.  Denies chest pain or shortness of breath.  Advised wife to bring [name] to ER for further evaluation.  She is agreeable with plan.

## 2017-12-06 NOTE — H&P (Signed)
TRH H&P   Patient Demographics:    [name], is a 84 y.o. male  MRN: [id number]   DOB - 05-02-1932  Admit Date - 12/06/2017  Outpatient Primary MD for the patient is [name], MD  Referring MD/NP/PA: Vonna Kotyk long  Outpatient Specialists:  Viann Fish Benshimon  Patient coming from:   home  Chief Complaint  Patient presents with  . Weakness  . Nausea      HPI:    [name]  is a 84 y.o. male,  w Hypothyroidism, Hypertension, Hyperlipidemia, CAD, CHF (EF ), Pafib, Copd, OSA c/o abdominal discomfort , "diffuse" for the past 4 days.  Pt started to have n/v earlier today this afternoon around 1pm.  ? Slight flank pain, ? Dysuria.  Pt denies fever, chills, cp, palp, sob beyond baseline, diarrhea, constipation, brbpr, black stool, hematuria.  Pt presented to ED for intractable n/v   In ED,   Abd xray IMPRESSION: Paucity of bowel gas limits evaluation. Within this limitation, no abnormalities are identified.   CT abd/ pelvis pending  Na 142, K 3.3 Bun 33, Creatinine 2.57 Ast 53, Alt 16, Alb 3.3 Lipase 28 Lactic acid 3.33  Trop 0.00 Influenza negative  BNP 400.9  Pt will be admitted for intractable n/v, abdominal pain and ? Slight flank pain and UTI.         Review of systems:    In addition to the HPI above, No Fever-chills, No Headache, No changes with Vision or hearing, No problems swallowing food or Liquids, No Chest pain, Cough or Shortness of Breath, No Abdominal pain, No Nausea or Vommitting, Bowel movements are regular, No Blood in stool or Urine, No dysuria, No new skin rashes or bruises, No new joints pains-aches,  No new weakness, tingling, numbness in any extremity, No recent weight gain or loss, No polyuria, polydypsia or polyphagia, No significant Mental Stressors.  A full 10 point Review of Systems was  done, except as stated above, all other Review of Systems were negative.   With Past History of the following :    Past Medical History:  Diagnosis Date  . Acute bronchitis with COPD ([place]) 09/10/2016  . Anemia   . Atrial fibrillation or flutter    maintaining sinus on amiodarone    . Bladder cancer ([place]) dx'd 05/2012  . BPH (benign prostatic hyperplasia)   . CAD (coronary artery disease)     a. s/p anterior MI 12/05 c/b shock -> stent LAD   b. s/p stenting OM-1, 2/06  . CHF (congestive heart failure) (HCC)    due to ischemic CM  a. EF 20-30%. (Nov 2008)   b. s/p St. Jude BiV-ICD    c. CPX 07/2008  pvo2 16.3 (63% predicted) slope 34 RER 1.08 O2 pulse 93%  . Cough    occasional /productive, no fever/ not new  . CRI (chronic renal  insufficiency)    (baseline 2.0-2.2)/ recent hospitalization 8/13  . GERD (gastroesophageal reflux disease)    hx Barretts esophagitis  . History of blood transfusion    following coumadin  usage  . HTN (hypertension)    EKG 05/14/12,Chest x ray 8/13, last ICD interrogation 8/13 EPIC  . Hyperlipidemia   . Hypothyroidism   . 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 Skin cancer    basal cells   facial x 4, 1 right forearm  . Sleep apnea    STOP BANG SCORE 4      Past Surgical History:  Procedure Laterality Date  . [place]   lower back  . CARDIAC DEFIBRILLATOR PLACEMENT     ICD St Jude; gen change 09-20-13  . CERVICAL LAMINECTOMY  1985  . COLONOSCOPY    . CORONARY ANGIOPLASTY  2005,2006  . CYSTOSCOPY W/ RETROGRADES  05/25/2012   Procedure: CYSTOSCOPY WITH RETROGRADE PYELOGRAM;  Surgeon: Molli Hazard, MD;  Location: [facility];  Service: Urology;  Laterality: Bilateral;  . CYSTOSCOPY W/ URETERAL STENT PLACEMENT  07/08/2012   Procedure: CYSTOSCOPY WITH STENT REPLACEMENT;  Surgeon: Molli Hazard, MD;  Location:  [facility];  Service: Urology;  Laterality: Left;  . CYSTOSCOPY W/ URETERAL STENT REMOVAL  07/08/2012   Procedure: CYSTOSCOPY WITH STENT REMOVAL;  Surgeon: Molli Hazard, MD;  Location: [facility];  Service: Urology;  Laterality: Bilateral;  . CYSTOSCOPY/RETROGRADE/URETEROSCOPY  07/08/2012   Procedure: CYSTOSCOPY/RETROGRADE/URETEROSCOPY;  Surgeon: Molli Hazard, MD;  Location: [facility];  Service: Urology;  Laterality: Left;  . ESOPHAGOGASTRODUODENOSCOPY    . HERNIA REPAIR  1989   bilateral  . IMPLANTABLE CARDIOVERTER DEFIBRILLATOR (ICD) GENERATOR CHANGE N/A 09/20/2013   Procedure: ICD GENERATOR CHANGE;  Surgeon: Deboraha Sprang, MD;  [facility];  Service: Cardiovascular;  Laterality: N/A;  . TRANSURETHRAL RESECTION OF BLADDER TUMOR  05/25/2012   cold cup biopsy prostate  . TRANSURETHRAL RESECTION OF BLADDER TUMOR  07/08/2012   Procedure: TRANSURETHRAL RESECTION OF BLADDER TUMOR (TURBT);  Surgeon: Molli Hazard, MD;  Location: [facility];  Service: Urology;  Laterality: N/A;  CYSTO, TURBT W/ GYRUS, BILATERAL STENT REMOVAL, LEFT URETEROSCOPY WITH BIOPSY, POSSIBLE LEFT STENT PLACEMENT       Social History:     Social History   Tobacco Use  . Smoking status: Former Smoker    Types: Cigarettes, Cigars    Last attempt to quit: 03/02/2004    Years since quitting: 13.7  . Smokeless tobacco: Former Systems developer    Quit date: 05/20/1999  . Tobacco comment: quit in 2005  Substance Use Topics  . Alcohol use: No     Lives - at home  Mobility - walks w walker   Family History :     Family History  Problem Relation Age of Onset  . Coronary artery disease Unknown   . Stroke Unknown       Home Medications:   Prior to Admission medications   Medication Sig Start Date End Date Taking? Authorizing Provider  acetaminophen (TYLENOL) 325 MG tablet Take 2 tablets (650 mg total) by mouth every 6 (six) hours as needed for mild pain (or Fever >/= 101). Patient taking differently: Take  1,000 mg by mouth every 6 (six) hours as needed for mild pain or headache.  08/18/13  Yes [name], MD  amiodarone ([place])  100 MG tablet Take 2 tablets (200 mg total) by mouth daily. Patient taking differently: Take 200 mg by mouth 2 (two) times daily.  10/15/17  Yes Domenic Polite, MD  apixaban (ELIQUIS) 2.5 MG TABS tablet Take 1 tablet (2.5 mg total) by mouth 2 (two) times daily. 10/30/17  Yes Bensimhon, Shaune Pascal, MD  carvedilol (COREG) 6.25 MG tablet TAKE 1 TABLET BY MOUTH TWICE DAILY WITH A MEAL Patient taking differently: TAKE 1 TABLET (6.25mg ) BY MOUTH TWICE DAILY WITH A MEAL 10/30/17  Yes Bensimhon, Shaune Pascal, MD  diphenhydramine-acetaminophen (TYLENOL PM) 25-500 MG TABS tablet Take 1 tablet by mouth at bedtime as needed (sleep).   Yes [provider]  ezetimibe (ZETIA) 10 MG tablet TAKE ONE TABLET BY MOUTH ONCE DAILY WITH SUPPER Patient taking differently: TAKE ONE TABLET (10mg ) BY MOUTH ONCE DAILY WITH SUPPER 03/06/17  Yes Larey Dresser, MD  ferrous sulfate ([employer] IRON) 325 (65 FE) MG tablet Take 325 mg by mouth daily with supper.    Yes [provider]  furosemide (LASIX) 20 MG tablet Take 2 tablets (40 mg total) by mouth daily. Patient taking differently: Take 20 mg by mouth 2 (two) times daily.  10/15/17  Yes Domenic Polite, MD  levothyroxine (SYNTHROID, LEVOTHROID) 50 MCG tablet Take 25-50 mcg by mouth daily before breakfast. Take 50 mcg (1 tablet) on Tues, Thurs, Sat, Sun and 25 mcg (1/2 tablet) on Mon, Wed, Fri   Yes [provider]  Magnesium Hydroxide (MILK OF MAGNESIA PO) Take 20 mLs by mouth daily as needed (for constipation).   Yes [provider]  Multiple Vitamins-Minerals (CENTRUM SILVER PO) Take 1 tablet by mouth at bedtime.    Yes [provider]  nitroGLYCERIN (NITROSTAT) 0.4 MG SL tablet DISSOLVE ONE TABLET UNDER THE TONGUE EVERY 5 MINUTES AS NEEDED FOR CHEST PAIN.  DO NOT EXCEED A TOTAL OF 3 DOSES IN 15 MINUTES 12/26/16  Yes  Larey Dresser, MD  pantoprazole (PROTONIX) 40 MG tablet Take 40 mg by mouth daily.  01/15/11  Yes [provider]  rosuvastatin (CRESTOR) 20 MG tablet Take 1 tablet (20 mg total) by mouth daily. Patient taking differently: Take 20 mg by mouth at bedtime.  10/18/15  Yes Bensimhon, Shaune Pascal, MD  sodium chloride (OCEAN) 0.65 % SOLN nasal spray Place 1-2 sprays into both nostrils daily as needed for congestion.    Yes [provider]  tamsulosin (FLOMAX) 0.4 MG CAPS capsule Take 0.4 mg by mouth at bedtime.  11/23/13  Yes [provider]     Allergies:     Allergies  Allergen Reactions  . Ramipril Other (See Comments)    cough  . Penicillins Itching and Rash    Has patient had a PCN reaction causing immediate rash, facial/tongue/throat swelling, SOB or lightheadedness with hypotension: No Has patient had a PCN reaction causing severe rash involving mucus membranes or skin necrosis: Yes-back and hands. Has patient had a PCN reaction that required hospitalization: No Has patient had a PCN reaction occurring within the last 10 years: Yes If all of the above answers are "NO", then may proceed with Cephalosporin use.     Physical Exam:   Vitals  Blood pressure 111/65, pulse 70, temperature 99 F (37.2 C), temperature source Oral, resp. rate 14, height 5\' 10"  (1.778 m), weight 78 kg (172 lb), SpO2 (!) 86 %.   1. General  lying in bed in NAD,    2. Normal affect and insight, Not Suicidal  or Homicidal, Awake Alert, Oriented X 3.  3. No F.N deficits, ALL C.Nerves Intact, Strength 5/5 all 4 extremities, Sensation intact all 4 extremities, Plantars down going.  4. Ears and Eyes appear Normal, Conjunctivae clear, PERRLA. Moist Oral Mucosa.  5. Supple Neck, No JVD, No cervical lymphadenopathy appriciated, No Carotid Bruits.  6. Symmetrical Chest wall movement, Good air movement bilaterally, CTAB.  7. RRR, No Gallops, Rubs or Murmurs, No Parasternal Heave.  8.  Positive Bowel Sounds, Abdomen Soft, No tenderness, No organomegaly appriciated,No rebound -guarding or rigidity.  9.  No Cyanosis, Normal Skin Turgor, No Skin Rash or Bruise.  10. Good muscle tone,  joints appear normal , no effusions, Normal ROM.  11. No Palpable Lymph Nodes in Neck or Axillae  Slight bilateral cva tenderness   Data Review:    CBC Recent Labs  Lab 12/06/17 1524  WBC 11.4*  HGB 12.8*  HCT 40.5  PLT 178  MCV 93.8  MCH 29.6  MCHC 31.6  RDW 14.2   ------------------------------------------------------------------------------------------------------------------  Chemistries  Recent Labs  Lab 12/06/17 1524 12/06/17 2049  NA 142 142  K 3.3* 4.2  CL 99* 106  CO2 21* 22  GLUCOSE 125* 139*  BUN 33* 33*  CREATININE 2.57* 2.68*  CALCIUM 8.2* 8.6*  AST  --  53*  ALT  --  16*  ALKPHOS  --  57  BILITOT  --  0.6   ------------------------------------------------------------------------------------------------------------------ estimated creatinine clearance is 21.2 mL/min (A) (by C-G formula based on SCr of 2.68 mg/dL (H)). ------------------------------------------------------------------------------------------------------------------ No results for input(s): TSH, T4TOTAL, T3FREE, THYROIDAB in the last 72 hours.  Invalid input(s): FREET3  Coagulation profile Recent Labs  Lab 12/06/17 2130  INR 1.17   ------------------------------------------------------------------------------------------------------------------- No results for input(s): DDIMER in the last 72 hours. -------------------------------------------------------------------------------------------------------------------  Cardiac Enzymes No results for input(s): CKMB, TROPONINI, MYOGLOBIN in the last 168 hours.  Invalid input(s): CK ------------------------------------------------------------------------------------------------------------------    Component Value Date/Time   BNP  400.9 (H) 12/06/2017 2130     ---------------------------------------------------------------------------------------------------------------  Urinalysis    Component Value Date/Time   COLORURINE YELLOW 12/06/2017 1524   APPEARANCEUR CLEAR 12/06/2017 1524   LABSPEC 1.010 12/06/2017 1524   PHURINE 5.0 12/06/2017 1524   GLUCOSEU NEGATIVE 12/06/2017 1524   HGBUR MODERATE (A) 12/06/2017 1524   BILIRUBINUR NEGATIVE 12/06/2017 1524   KETONESUR NEGATIVE 12/06/2017 1524   PROTEINUR NEGATIVE 12/06/2017 1524   UROBILINOGEN 0.2 08/15/2013 1132   NITRITE NEGATIVE 12/06/2017 1524   LEUKOCYTESUR NEGATIVE 12/06/2017 1524    ----------------------------------------------------------------------------------------------------------------   Imaging Results:    Dg Abdomen Acute W/chest  Result Date: 12/06/2017 CLINICAL DATA:  Weakness.  Vomiting. EXAM: DG ABDOMEN ACUTE W/ 1V CHEST COMPARISON:  None. FINDINGS: There is a nodular density in the lateral right lung, unchanged since 2014. Linear calcification in the upper right chest is also unchanged since 2014. No new nodules or masses. Stable AICD device. The heart, hila, mediastinum are unremarkable. No other acute abnormalities in the chest. No free air or free fluid. There is a paucity of bowel gas but no evidence of obstruction. No renal stones identified. No other acute abnormalities. IMPRESSION: Paucity of bowel gas limits evaluation. Within this limitation, no abnormalities are identified. Electronically Signed   By: Dorise Bullion III M.D   On: 12/06/2017 16:37       Assessment & Plan:    Principal Problem:   Nausea & vomiting Active Problems:   Atrial fibrillation (HCC)   UTI (urinary tract infection)   CKD (chronic  kidney disease) stage 4, GFR 15-29 ml/min (HCC)   Renal insufficiency    N/v zofran 4mg  iv q6h prn CT abd/ pelvis pending  UTI Await urine culture cipro iv pharmacy to dose  CKD stage 4 Check cmp in  am  Pafib Cont Carvedilol Cont Amiodarone Cont Eliquis  CHF/ CAD Cont Aspirin Cont Crestor, Cont Zetia Cont Carvedilol Cont Lasix  Hypothyroidism Check TSH Cont Levothyroxine  Gerd Cont Protonix  Bph Cont Flomax   DVT Prophylaxis    Eliquis, SCDs   AM Labs Ordered, also please review Full Orders  Family Communication: Admission, patients condition and plan of care including tests being ordered have been discussed with the patient  who indicate understanding and agree with the plan and Code Status.  Code Status FULL CODE  Likely DC to  home  Condition GUARDED    Consults called:  none  Admission status:  inpatient  Time spent in minutes : 45   Jani Gravel M.D on 12/06/2017 at 11:04 PM  Between 7am to 7pm - Pager - [phone]. After 7pm go to [email or web address] - password [facility] [place] [facility]  [facility] - Office  [phone]

## 2017-12-06 NOTE — ED Provider Notes (Signed)
Emergency Department Provider Note   I have reviewed the triage vital signs and the nursing notes.   HISTORY  Chief Complaint Weakness and Nausea   HPI [name] is a 84 y.o. male with PMH of COPD, A-fib on Amiodarone, CAD, CHF, GERD, HTN to the emergency department for evaluation of sudden onset generalized fatigue, vomiting, cough, and body aches.  The patient was feeling fine yesterday.  Family state that these symptoms began suddenly today.  Having some mild discomfort on the right middle of his back but denies any chest pain, dyspnea, abdominal pain.  Some of the vomiting has been in the setting of vigorous coughing, according to family.  He has not seen any blood in the emesis.  No associated diarrhea.  No recent changes to medications. No sick contacts. Patient did get the flu shot this year.    Past Medical History:  Diagnosis Date  . Acute bronchitis with COPD ([place]) 09/10/2016  . Anemia   . Atrial fibrillation or flutter    maintaining sinus on amiodarone    . Bladder cancer ([place]) dx'd 05/2012  . BPH (benign prostatic hyperplasia)   . CAD (coronary artery disease)     a. s/p anterior MI 12/05 c/b shock -> stent LAD   b. s/p stenting OM-1, 2/06  . CHF (congestive heart failure) (HCC)    due to ischemic CM  a. EF 20-30%. (Nov 2008)   b. s/p St. Jude BiV-ICD    c. CPX 07/2008  pvo2 16.3 (63% predicted) slope 34 RER 1.08 O2 pulse 93%  . Cough    occasional /productive, no fever/ not new  . CRI (chronic renal insufficiency)    (baseline 2.0-2.2)/ recent hospitalization 8/13  . GERD (gastroesophageal reflux disease)    hx Barretts esophagitis  . History of blood transfusion    following coumadin  usage  . HTN (hypertension)    EKG 05/14/12,Chest x ray 8/13, last ICD interrogation 8/13 EPIC  . Hyperlipidemia   . Hypothyroidism   . 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 Skin cancer    basal cells   facial x 4, 1 right forearm  . Sleep apnea    STOP BANG SCORE 4    Patient Active Problem List   Diagnosis Date Noted  . Nausea & vomiting 12/06/2017  . Renal insufficiency 12/06/2017  . Acute respiratory failure with hypoxia ([place]) 09/10/2016  . Acute bronchitis with COPD ([place]) 09/10/2016  . CKD (chronic kidney disease) stage 4, GFR 15-29 ml/min (HCC) 04/04/2015  . 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 Urosepsis 08/16/2013  . Bruising 08/12/2012  . Bladder cancer ([place]) 07/08/2012  . UTI (urinary tract infection) 05/27/2012  . Bladder tumor 05/18/2012  . Knee pain, acute, right 02/11/2012  . CAROTID ARTERY DISEASE 12/06/2010  . Essential hypertension, benign 09/22/2009  . Chronic systolic CHF (congestive heart failure) (HCC)-EF 25% 09/14/2009  . CHEST PAIN 08/08/2009  . Mixed hyperlipidemia 05/25/2009  . CAD, NATIVE VESSEL 02/01/2009  . Ischemic cardiomyopathy 01/24/2009  . Atrial fibrillation ([place]) 01/21/2009  . ICD (implantable cardioverter-defibrillator) in place (St Jude pacer/ICD) 01/21/2009    Past Surgical History:  Procedure Laterality Date  . [place]   lower back  . CARDIAC DEFIBRILLATOR PLACEMENT     ICD St Jude; gen change 09-20-13  .  CERVICAL LAMINECTOMY  1985  . COLONOSCOPY    . CORONARY ANGIOPLASTY  2005,2006  . CYSTOSCOPY W/ RETROGRADES  05/25/2012   Procedure: CYSTOSCOPY WITH RETROGRADE PYELOGRAM;  Surgeon: Molli Hazard, MD;  Location: [facility];  Service: Urology;  Laterality: Bilateral;  . CYSTOSCOPY W/ URETERAL STENT PLACEMENT  07/08/2012   Procedure: CYSTOSCOPY WITH STENT REPLACEMENT;  Surgeon: Molli Hazard, MD;  Location: [facility];  Service: Urology;  Laterality: Left;  . CYSTOSCOPY W/ URETERAL STENT REMOVAL  07/08/2012   Procedure: CYSTOSCOPY WITH STENT REMOVAL;  Surgeon: Molli Hazard, MD;  Location: [facility];  Service: Urology;   Laterality: Bilateral;  . CYSTOSCOPY/RETROGRADE/URETEROSCOPY  07/08/2012   Procedure: CYSTOSCOPY/RETROGRADE/URETEROSCOPY;  Surgeon: Molli Hazard, MD;  Location: [facility];  Service: Urology;  Laterality: Left;  . ESOPHAGOGASTRODUODENOSCOPY    . HERNIA REPAIR  1989   bilateral  . IMPLANTABLE CARDIOVERTER DEFIBRILLATOR (ICD) GENERATOR CHANGE N/A 09/20/2013   Procedure: ICD GENERATOR CHANGE;  Surgeon: Deboraha Sprang, MD;  Location: [facility] CATH LAB;  Service: Cardiovascular;  Laterality: N/A;  . TRANSURETHRAL RESECTION OF BLADDER TUMOR  05/25/2012   cold cup biopsy prostate  . TRANSURETHRAL RESECTION OF BLADDER TUMOR  07/08/2012   Procedure: TRANSURETHRAL RESECTION OF BLADDER TUMOR (TURBT);  Surgeon: Molli Hazard, MD;  Location: [facility];  Service: Urology;  Laterality: N/A;  CYSTO, TURBT W/ GYRUS, BILATERAL STENT REMOVAL, LEFT URETEROSCOPY WITH BIOPSY, POSSIBLE LEFT STENT PLACEMENT       Allergies Ramipril and Penicillins  Family History  Problem Relation Age of Onset  . Coronary artery disease Unknown   . Stroke Unknown     Social History Social History   Tobacco Use  . Smoking status: Former Smoker    Types: Cigarettes, Cigars    Last attempt to quit: 03/02/2004    Years since quitting: 13.7  . Smokeless tobacco: Former Systems developer    Quit date: 05/20/1999  . Tobacco comment: quit in 2005  Substance Use Topics  . Alcohol use: No  . Drug use: No    Review of Systems  Constitutional: No fever/chills Eyes: No visual changes. ENT: No sore throat. Cardiovascular: Denies chest pain. Respiratory: Denies shortness of breath. Positive cough.  Gastrointestinal: No abdominal pain. Positive nausea and vomiting.  No diarrhea.  No constipation. Genitourinary: Negative for dysuria. Musculoskeletal: Negative for back pain. Skin: Negative for rash. Neurological: Negative for headaches, focal weakness or numbness.  10-point ROS otherwise  negative.  ____________________________________________   PHYSICAL EXAM:  VITAL SIGNS: ED Triage Vitals  Enc Vitals Group     BP 12/06/17 1521 132/67     Pulse Rate 12/06/17 1521 69     Resp 12/06/17 1521 (!) 29     Temp 12/06/17 1521 99 F (37.2 C)     Temp Source 12/06/17 1521 Oral     SpO2 12/06/17 1517 94 %     Weight 12/06/17 1522 172 lb (78 kg)     Height 12/06/17 1522 5\' 10"  (1.778 m)   Constitutional: Alert and oriented. Appears somewhat pale and fatigued but in no acute distress. Able to provide full history.  Eyes: Conjunctivae are normal.  Head: Atraumatic. Nose: No congestion/rhinnorhea. Mouth/Throat: Mucous membranes are dry.  Neck: No stridor. Cardiovascular: Normal rate, regular rhythm. Good peripheral circulation. Grossly normal heart sounds.   Respiratory: Normal respiratory effort.  No retractions. Lungs CTAB. Gastrointestinal: Soft and nontender. No distention.  Musculoskeletal: No lower extremity tenderness nor edema. No gross deformities of extremities. Neurologic:  Normal speech  and language. No gross focal neurologic deficits are appreciated.  Skin:  Skin is warm, dry and intact. No rash noted.  ____________________________________________   LABS (all labs ordered are listed, but only abnormal results are displayed)  Labs Reviewed  BASIC METABOLIC PANEL - Abnormal; Notable for the following components:      Result Value   Potassium 3.3 (*)    Chloride 99 (*)    CO2 21 (*)    Glucose, Bld 125 (*)    BUN 33 (*)    Creatinine, Ser 2.57 (*)    Calcium 8.2 (*)    GFR calc non Af Amer 21 (*)    GFR calc Af Amer 25 (*)    Anion gap 22 (*)    All other components within normal limits  CBC - Abnormal; Notable for the following components:   WBC 11.4 (*)    Hemoglobin 12.8 (*)    All other components within normal limits  URINALYSIS, ROUTINE W REFLEX MICROSCOPIC - Abnormal; Notable for the following components:   Hgb urine dipstick MODERATE (*)     Bacteria, UA RARE (*)    Squamous Epithelial / LPF 0-5 (*)    All other components within normal limits  HEPATIC FUNCTION PANEL - Abnormal; Notable for the following components:   Albumin 3.3 (*)    AST 53 (*)    ALT 16 (*)    All other components within normal limits  BRAIN NATRIURETIC PEPTIDE - Abnormal; Notable for the following components:   B Natriuretic Peptide 400.9 (*)    All other components within normal limits  BASIC METABOLIC PANEL - Abnormal; Notable for the following components:   Glucose, Bld 139 (*)    BUN 33 (*)    Creatinine, Ser 2.68 (*)    Calcium 8.6 (*)    GFR calc non Af Amer 20 (*)    GFR calc Af Amer 24 (*)    All other components within normal limits  CBG MONITORING, ED - Abnormal; Notable for the following components:   Glucose-Capillary 121 (*)    All other components within normal limits  I-STAT VENOUS BLOOD GAS, ED - Abnormal; Notable for the following components:   pO2, Ven 22.0 (*)    Bicarbonate 29.2 (*)    Acid-Base Excess 4.0 (*)    All other components within normal limits  I-STAT CG4 LACTIC ACID, ED - Abnormal; Notable for the following components:   Lactic Acid, Venous 3.33 (*)    All other components within normal limits  CULTURE, BLOOD (ROUTINE X 2)  CULTURE, BLOOD (ROUTINE X 2)  LIPASE, BLOOD  INFLUENZA PANEL BY PCR (TYPE A & B)  PROTIME-INR  COMPREHENSIVE METABOLIC PANEL  CBC  TSH  I-STAT TROPONIN, ED  I-STAT CG4 LACTIC ACID, ED   ____________________________________________  EKG   EKG Interpretation  Date/Time:  Saturday December 06 2017 15:24:27 EST Ventricular Rate:  69 PR Interval:    QRS Duration: 139 QT Interval:  465 QTC Calculation: 499 R Axis:   -124 Text Interpretation:  Atrial-sensed ventricular-paced rhythm No further analysis attempted due to paced rhythm No STEMI.  Confirmed by Nanda Quinton [phone]) on 12/06/2017 3:33:00 PM       ____________________________________________  RADIOLOGY  Dg Abdomen  Acute W/chest  Result Date: 12/06/2017 CLINICAL DATA:  Weakness.  Vomiting. EXAM: DG ABDOMEN ACUTE W/ 1V CHEST COMPARISON:  None. FINDINGS: There is a nodular density in the lateral right lung, unchanged since 2014. Linear calcification in the upper  right chest is also unchanged since 2014. No new nodules or masses. Stable AICD device. The heart, hila, mediastinum are unremarkable. No other acute abnormalities in the chest. No free air or free fluid. There is a paucity of bowel gas but no evidence of obstruction. No renal stones identified. No other acute abnormalities. IMPRESSION: Paucity of bowel gas limits evaluation. Within this limitation, no abnormalities are identified. Electronically Signed   By: Dorise Bullion III M.D   On: 12/06/2017 16:37    ____________________________________________   PROCEDURES  Procedure(s) performed:   Procedures  None ___________________________________________   INITIAL IMPRESSION / ASSESSMENT AND PLAN / ED COURSE  Pertinent labs & imaging results that were available during my care of the patient were reviewed by me and considered in my medical decision making (see chart for details).  Patient presents to the emergency department for evaluation of sudden onset vomiting, cough, body aches, and fatigue. The abdomen is diffusely soft and nontender. He denies any chest pain.  His multiple medical comorbidities. Afebrile here. No tachycardia.  Doubt sepsis.  Suspect possible viral etiology. Will send flu testing and obtain plain films of the abdomen and chest.   09:54 PM Patient lactate now 3.33 after initial normal lactate. Will give additional IVF bolus. No fever. No abdominal/chest pain. No SOB. No vomiting or diarrhea since arrival. With no fever or tachycardia this lactate may represent worsening dehydration. No clear source to suspect developing sepsis. Continue gentile fluids.   On re-evaluation the patient has starting vomiting once again. No  abdominal pain but will follow with CT abdomen/pelvis.   Discussed patient's case with Hospitalist, Dr. Maudie Mercury to request admission. Patient and family (if present) updated with plan. Care transferred to Hospitalist service.  I reviewed all nursing notes, vitals, pertinent old records, EKGs, labs, imaging (as available).  ____________________________________________  FINAL CLINICAL IMPRESSION(S) / ED DIAGNOSES  Final diagnoses:  Non-intractable vomiting with nausea, unspecified vomiting type  Dehydration  Lactic acidosis     MEDICATIONS GIVEN DURING THIS VISIT:  Medications  ondansetron (ZOFRAN) injection 4 mg (not administered)  amiodarone (PACERONE) tablet 200 mg (200 mg Oral Given 12/07/17 0047)  carvedilol (COREG) tablet 3.125 mg (not administered)  ezetimibe (ZETIA) tablet 10 mg (not administered)  ferrous sulfate tablet 325 mg (not administered)  furosemide (LASIX) tablet 20 mg (not administered)  levothyroxine (SYNTHROID, LEVOTHROID) tablet 50 mcg (not administered)  multivitamin with minerals tablet 1 tablet (1 tablet Oral Given 12/07/17 0044)  pantoprazole (PROTONIX) EC tablet 40 mg (not administered)  rosuvastatin (CRESTOR) tablet 20 mg (20 mg Oral Given 12/07/17 0044)  sodium chloride (OCEAN) 0.65 % nasal spray 1-2 spray (not administered)  tamsulosin (FLOMAX) capsule 0.4 mg (0.4 mg Oral Given 12/07/17 0045)  sodium chloride flush (NS) 0.9 % injection 3 mL (0 mLs Intravenous Duplicate 0/22/33 6122)  sodium chloride flush (NS) 0.9 % injection 3 mL (not administered)  0.9 %  sodium chloride infusion (not administered)  acetaminophen (TYLENOL) tablet 650 mg (650 mg Oral Given 12/07/17 0045)    Or  acetaminophen (TYLENOL) suppository 650 mg ( Rectal See Alternative 12/07/17 0045)  diphenhydrAMINE (BENADRYL) capsule 25 mg (not administered)    And  acetaminophen (TYLENOL) tablet 500 mg (not administered)  levothyroxine (SYNTHROID, LEVOTHROID) tablet 25 mcg (not administered)   iopamidol (ISOVUE-300) 61 % injection (not administered)  ciprofloxacin (CIPRO) IVPB 200 mg (not administered)  ciprofloxacin (CIPRO) IVPB 200 mg (not administered)  apixaban (ELIQUIS) tablet 2.5 mg (not administered)  ondansetron (ZOFRAN) injection 4 mg (  4 mg Intravenous Given 12/06/17 1631)  sodium chloride 0.9 % bolus 500 mL (0 mLs Intravenous Stopped 12/06/17 2055)  sodium chloride 0.9 % bolus 500 mL (500 mLs Intravenous New Bag/Given 12/06/17 2251)  metoCLOPramide (REGLAN) injection 10 mg (10 mg Intravenous Given 12/06/17 2251)    Note:  This document was prepared using Dragon voice recognition software and may include unintentional dictation errors.  Nanda Quinton, MD Emergency Medicine    Malloree Raboin, [place], MD 12/07/17 [phone]

## 2017-12-06 NOTE — ED Notes (Signed)
Attempted report x1. RN unavailable @ this time. 

## 2017-12-07 ENCOUNTER — Other Ambulatory Visit: Payer: Self-pay

## 2017-12-07 ENCOUNTER — Inpatient Hospital Stay (HOSPITAL_COMMUNITY): Payer: Medicare Other

## 2017-12-07 DIAGNOSIS — I4891 Unspecified atrial fibrillation: Secondary | ICD-10-CM

## 2017-12-07 DIAGNOSIS — Z95810 Presence of automatic (implantable) cardiac defibrillator: Secondary | ICD-10-CM

## 2017-12-07 DIAGNOSIS — Z7901 Long term (current) use of anticoagulants: Secondary | ICD-10-CM

## 2017-12-07 DIAGNOSIS — Z888 Allergy status to other drugs, medicaments and biological substances status: Secondary | ICD-10-CM

## 2017-12-07 DIAGNOSIS — N184 Chronic kidney disease, stage 4 (severe): Secondary | ICD-10-CM

## 2017-12-07 DIAGNOSIS — Z87440 Personal history of urinary (tract) infections: Secondary | ICD-10-CM

## 2017-12-07 DIAGNOSIS — Z955 Presence of coronary angioplasty implant and graft: Secondary | ICD-10-CM

## 2017-12-07 DIAGNOSIS — E872 Acidosis: Secondary | ICD-10-CM

## 2017-12-07 DIAGNOSIS — I2510 Atherosclerotic heart disease of native coronary artery without angina pectoris: Secondary | ICD-10-CM

## 2017-12-07 DIAGNOSIS — N390 Urinary tract infection, site not specified: Secondary | ICD-10-CM

## 2017-12-07 DIAGNOSIS — B952 Enterococcus as the cause of diseases classified elsewhere: Secondary | ICD-10-CM

## 2017-12-07 DIAGNOSIS — R319 Hematuria, unspecified: Secondary | ICD-10-CM

## 2017-12-07 DIAGNOSIS — Z87891 Personal history of nicotine dependence: Secondary | ICD-10-CM

## 2017-12-07 DIAGNOSIS — Z880 Allergy status to penicillin: Secondary | ICD-10-CM

## 2017-12-07 DIAGNOSIS — J449 Chronic obstructive pulmonary disease, unspecified: Secondary | ICD-10-CM

## 2017-12-07 LAB — BLOOD CULTURE ID PANEL (REFLEXED)
Acinetobacter baumannii: NOT DETECTED
CANDIDA ALBICANS: NOT DETECTED
CANDIDA TROPICALIS: NOT DETECTED
Candida glabrata: NOT DETECTED
Candida krusei: NOT DETECTED
Candida parapsilosis: NOT DETECTED
ENTEROBACTERIACEAE SPECIES: NOT DETECTED
ENTEROCOCCUS SPECIES: DETECTED — AB
Enterobacter cloacae complex: NOT DETECTED
Escherichia coli: NOT DETECTED
HAEMOPHILUS INFLUENZAE: NOT DETECTED
KLEBSIELLA PNEUMONIAE: NOT DETECTED
Klebsiella oxytoca: NOT DETECTED
LISTERIA MONOCYTOGENES: NOT DETECTED
NEISSERIA MENINGITIDIS: NOT DETECTED
PROTEUS SPECIES: NOT DETECTED
Pseudomonas aeruginosa: NOT DETECTED
STAPHYLOCOCCUS AUREUS BCID: NOT DETECTED
STREPTOCOCCUS AGALACTIAE: NOT DETECTED
STREPTOCOCCUS SPECIES: NOT DETECTED
Serratia marcescens: NOT DETECTED
Staphylococcus species: NOT DETECTED
Streptococcus pneumoniae: NOT DETECTED
Streptococcus pyogenes: NOT DETECTED
VANCOMYCIN RESISTANCE: NOT DETECTED

## 2017-12-07 LAB — COMPREHENSIVE METABOLIC PANEL
ALK PHOS: 50 U/L (ref 38–126)
ALT: 14 U/L — AB (ref 17–63)
ANION GAP: 11 (ref 5–15)
AST: 30 U/L (ref 15–41)
Albumin: 3 g/dL — ABNORMAL LOW (ref 3.5–5.0)
BUN: 34 mg/dL — ABNORMAL HIGH (ref 6–20)
CO2: 23 mmol/L (ref 22–32)
CREATININE: 2.77 mg/dL — AB (ref 0.61–1.24)
Calcium: 8.1 mg/dL — ABNORMAL LOW (ref 8.9–10.3)
Chloride: 102 mmol/L (ref 101–111)
GFR, EST AFRICAN AMERICAN: 23 mL/min — AB (ref >60)
GFR, EST NON AFRICAN AMERICAN: 20 mL/min — AB (ref >60)
Glucose, Bld: 126 mg/dL — ABNORMAL HIGH (ref 65–99)
Potassium: 3.2 mmol/L — ABNORMAL LOW (ref 3.5–5.1)
SODIUM: 136 mmol/L (ref 135–145)
TOTAL PROTEIN: 5.9 g/dL — AB (ref 6.5–8.1)
Total Bilirubin: 0.8 mg/dL (ref 0.3–1.2)

## 2017-12-07 LAB — CBC
HCT: 34.8 % — ABNORMAL LOW (ref 39.0–52.0)
Hemoglobin: 10.9 g/dL — ABNORMAL LOW (ref 13.0–17.0)
MCH: 29.2 pg (ref 26.0–34.0)
MCHC: 31.3 g/dL (ref 30.0–36.0)
MCV: 93.3 fL (ref 78.0–100.0)
PLATELETS: 160 10*3/uL (ref 150–400)
RBC: 3.73 MIL/uL — AB (ref 4.22–5.81)
RDW: 14.3 % (ref 11.5–15.5)
WBC: 11.9 10*3/uL — ABNORMAL HIGH (ref 4.0–10.5)

## 2017-12-07 LAB — CK: CK TOTAL: 338 U/L (ref 49–397)

## 2017-12-07 LAB — TSH: TSH: 2.885 u[IU]/mL (ref 0.350–4.500)

## 2017-12-07 MED ORDER — VANCOMYCIN HCL 10 G IV SOLR
1500.0000 mg | Freq: Once | INTRAVENOUS | Status: AC
  Administered 2017-12-07: 1500 mg via INTRAVENOUS
  Filled 2017-12-07: qty 1500

## 2017-12-07 MED ORDER — DIPHENHYDRAMINE HCL 25 MG PO CAPS
25.0000 mg | ORAL_CAPSULE | Freq: Every evening | ORAL | Status: DC | PRN
  Administered 2017-12-08 – 2017-12-11 (×2): 25 mg via ORAL
  Filled 2017-12-07 (×2): qty 1

## 2017-12-07 MED ORDER — ADULT MULTIVITAMIN W/MINERALS CH
1.0000 | ORAL_TABLET | Freq: Every day | ORAL | Status: DC
  Administered 2017-12-07 – 2017-12-11 (×6): 1 via ORAL
  Filled 2017-12-07 (×6): qty 1

## 2017-12-07 MED ORDER — CARVEDILOL 3.125 MG PO TABS
3.1250 mg | ORAL_TABLET | Freq: Two times a day (BID) | ORAL | Status: DC
  Administered 2017-12-07 – 2017-12-12 (×10): 3.125 mg via ORAL
  Filled 2017-12-07 (×10): qty 1

## 2017-12-07 MED ORDER — SODIUM CHLORIDE 0.9 % IV SOLN
650.0000 mg | INTRAVENOUS | Status: DC
  Administered 2017-12-07 – 2017-12-09 (×2): 650 mg via INTRAVENOUS
  Filled 2017-12-07 (×4): qty 13

## 2017-12-07 MED ORDER — SODIUM CHLORIDE 0.9% FLUSH
3.0000 mL | Freq: Two times a day (BID) | INTRAVENOUS | Status: DC
  Administered 2017-12-07 – 2017-12-11 (×8): 3 mL via INTRAVENOUS

## 2017-12-07 MED ORDER — CIPROFLOXACIN IN D5W 200 MG/100ML IV SOLN: 200.0000 mg | INTRAVENOUS | Status: DC

## 2017-12-07 MED ORDER — CIPROFLOXACIN IN D5W 200 MG/100ML IV SOLN
200.0000 mg | Freq: Once | INTRAVENOUS | Status: AC
  Administered 2017-12-07: 200 mg via INTRAVENOUS
  Filled 2017-12-07: qty 100

## 2017-12-07 MED ORDER — IOPAMIDOL (ISOVUE-300) INJECTION 61%
INTRAVENOUS | Status: AC
  Filled 2017-12-07: qty 30

## 2017-12-07 MED ORDER — ACETAMINOPHEN 500 MG PO TABS
500.0000 mg | ORAL_TABLET | Freq: Every evening | ORAL | Status: DC | PRN
  Administered 2017-12-08 – 2017-12-09 (×2): 500 mg via ORAL
  Filled 2017-12-07 (×2): qty 1

## 2017-12-07 MED ORDER — SODIUM CHLORIDE 0.9 % IV SOLN
1.0000 g | INTRAVENOUS | Status: DC
  Administered 2017-12-07: 1 g via INTRAVENOUS
  Filled 2017-12-07: qty 10

## 2017-12-07 MED ORDER — ONDANSETRON HCL 4 MG/2ML IJ SOLN
4.0000 mg | Freq: Four times a day (QID) | INTRAMUSCULAR | Status: DC | PRN
  Administered 2017-12-07: 4 mg via INTRAVENOUS
  Filled 2017-12-07: qty 2

## 2017-12-07 MED ORDER — APIXABAN 2.5 MG PO TABS
2.5000 mg | ORAL_TABLET | Freq: Two times a day (BID) | ORAL | Status: DC
  Administered 2017-12-07 – 2017-12-12 (×12): 2.5 mg via ORAL
  Filled 2017-12-07 (×12): qty 1

## 2017-12-07 MED ORDER — ROSUVASTATIN CALCIUM 10 MG PO TABS
20.0000 mg | ORAL_TABLET | Freq: Every day | ORAL | Status: DC
  Administered 2017-12-07: 20 mg via ORAL
  Filled 2017-12-07: qty 2

## 2017-12-07 MED ORDER — ACETAMINOPHEN 325 MG PO TABS
650.0000 mg | ORAL_TABLET | Freq: Four times a day (QID) | ORAL | Status: DC | PRN
  Administered 2017-12-07 (×3): 650 mg via ORAL
  Filled 2017-12-07 (×3): qty 2

## 2017-12-07 MED ORDER — ACETAMINOPHEN 650 MG RE SUPP: 650.0000 mg | Freq: Four times a day (QID) | RECTAL | Status: DC | PRN

## 2017-12-07 MED ORDER — SODIUM CHLORIDE 0.9% FLUSH: 3.0000 mL | INTRAVENOUS | Status: DC | PRN

## 2017-12-07 MED ORDER — LEVOTHYROXINE SODIUM 25 MCG PO TABS
25.0000 ug | ORAL_TABLET | ORAL | Status: DC
  Administered 2017-12-08 – 2017-12-12 (×3): 25 ug via ORAL
  Filled 2017-12-07 (×3): qty 1

## 2017-12-07 MED ORDER — AMIODARONE HCL 200 MG PO TABS
200.0000 mg | ORAL_TABLET | Freq: Two times a day (BID) | ORAL | Status: DC
  Administered 2017-12-07 – 2017-12-12 (×12): 200 mg via ORAL
  Filled 2017-12-07 (×12): qty 1

## 2017-12-07 MED ORDER — FUROSEMIDE 20 MG PO TABS: 20.0000 mg | ORAL_TABLET | Freq: Two times a day (BID) | ORAL | Status: DC

## 2017-12-07 MED ORDER — PANTOPRAZOLE SODIUM 40 MG PO TBEC
40.0000 mg | DELAYED_RELEASE_TABLET | Freq: Every day | ORAL | Status: DC
  Administered 2017-12-07 – 2017-12-12 (×6): 40 mg via ORAL
  Filled 2017-12-07 (×6): qty 1

## 2017-12-07 MED ORDER — SODIUM CHLORIDE 0.9 % IV SOLN
250.0000 mL | INTRAVENOUS | Status: DC | PRN
  Administered 2017-12-10: 11:00:00 via INTRAVENOUS

## 2017-12-07 MED ORDER — EZETIMIBE 10 MG PO TABS
10.0000 mg | ORAL_TABLET | Freq: Every day | ORAL | Status: DC
  Administered 2017-12-07 – 2017-12-12 (×6): 10 mg via ORAL
  Filled 2017-12-07 (×6): qty 1

## 2017-12-07 MED ORDER — SALINE SPRAY 0.65 % NA SOLN: 1.0000 | Freq: Every day | NASAL | Status: DC | PRN

## 2017-12-07 MED ORDER — TAMSULOSIN HCL 0.4 MG PO CAPS
0.4000 mg | ORAL_CAPSULE | Freq: Every day | ORAL | Status: DC
  Administered 2017-12-07 – 2017-12-11 (×6): 0.4 mg via ORAL
  Filled 2017-12-07 (×6): qty 1

## 2017-12-07 MED ORDER — PROMETHAZINE HCL 25 MG/ML IJ SOLN
12.5000 mg | Freq: Four times a day (QID) | INTRAMUSCULAR | Status: DC | PRN
  Administered 2017-12-07: 12.5 mg via INTRAVENOUS
  Filled 2017-12-07: qty 1

## 2017-12-07 MED ORDER — VANCOMYCIN HCL IN DEXTROSE 1-5 GM/200ML-% IV SOLN: 1000.0000 mg | INTRAVENOUS | Status: DC

## 2017-12-07 MED ORDER — SODIUM CHLORIDE 0.9 % IV SOLN
INTRAVENOUS | Status: DC
  Administered 2017-12-07 – 2017-12-09 (×3): via INTRAVENOUS

## 2017-12-07 MED ORDER — LEVOTHYROXINE SODIUM 50 MCG PO TABS
50.0000 ug | ORAL_TABLET | ORAL | Status: DC
  Administered 2017-12-07 – 2017-12-11 (×3): 50 ug via ORAL
  Filled 2017-12-07: qty 2
  Filled 2017-12-07: qty 1
  Filled 2017-12-07: qty 2

## 2017-12-07 MED ORDER — FERROUS SULFATE 325 (65 FE) MG PO TABS
325.0000 mg | ORAL_TABLET | Freq: Every day | ORAL | Status: DC
  Administered 2017-12-08 – 2017-12-12 (×5): 325 mg via ORAL
  Filled 2017-12-07 (×5): qty 1

## 2017-12-07 MED ORDER — DIPHENHYDRAMINE-APAP (SLEEP) 25-500 MG PO TABS: 1.0000 | ORAL_TABLET | Freq: Every evening | ORAL | Status: DC | PRN

## 2017-12-07 MED ORDER — ROSUVASTATIN CALCIUM 10 MG PO TABS
10.0000 mg | ORAL_TABLET | Freq: Every day | ORAL | Status: DC
  Administered 2017-12-07 – 2017-12-11 (×5): 10 mg via ORAL
  Filled 2017-12-07 (×5): qty 1

## 2017-12-07 NOTE — Progress Notes (Addendum)
PROGRESS NOTE    [name]  PQZ:[id number] DOB: 08/20/1932 DOA: 12/06/2017 PCP: [name], MD  Brief Narrative: 84/M with Hypothyroidism, Hypertension, CAD, P.Afib, Severe Cardiomyopathy EF 25%, COPD, OSA presented to ED with nausea, vomiting and fever CT unremarkable, UA with 6-30WBC and rare bacteria  Assessment & Plan:   Enterococcal bacteremia -likely urinary source, urine not highly suggestive of infection though, FU Urine Cx -continue IV Vancomycin -IVF today, BP soft -CT abd pelvis unremarkable -repeat Blood Cx tomorrow, check ECHO -will consult ID on Monday  Chronic systolic CHF/ischemic cardiomyopathy -Status post AICD -Echo with EF of 20%  -Continue aspirin, Coreg -hold diuretics today, BP soft, IVF for 12-24H  Paroxysmal atrial fibrillation -chads2vasc score>3, continue Coreg, amiodarone and low dose apixaban  CKD4 -Followed by Dr. Pearson Grippe with [facility] -stable, Baseline creatinine is 2.5-3 range -He would be a very poor dialysis candidate when he gets to that stage  Hypertension -continue Coreg , hold diuretics today  COPD -Stable, not on home oxygen -continue nebs PRN  DVT prophylaxis: Eliquis Code Status: Full Code Family Communication: None at bedside Disposition Plan: Home pending improvement  Consultants:      Procedures:   Antimicrobials:  Antibiotics Given (last 72 hours)    Date/Time Action Medication Dose Rate   12/07/17 0219 New Bag/Given   ciprofloxacin (CIPRO) IVPB 200 mg 200 mg 100 mL/hr   12/07/17 1042 New Bag/Given   cefTRIAXone (ROCEPHIN) 1 g in sodium chloride 0.9 % 100 mL IVPB 1 g 200 mL/hr      Subjective: -feels better, no further vomiting  Objective: Vitals:   12/07/17 0513 12/07/17 0734 12/07/17 1003 12/07/17 1005  BP: (!) 139/104 (!) 107/48 (!) 88/45 (!) 91/45  Pulse: 85 65 62 64  Resp: (!) 31 (!) 24 (!) 23 (!) 24  Temp: 99.5 F (37.5 C) (!) 102 F (38.9 C) 100 F (37.8  C)   TempSrc: Oral Oral Oral   SpO2: 96% 95% 94% 94%  Weight:      Height:        Intake/Output Summary (Last 24 hours) at 12/07/2017 1100 Last data filed at 12/07/2017 0800 Gross per 24 hour  Intake 620 ml  Output -  Net 620 ml   Filed Weights   12/06/17 1522 12/07/17 0000  Weight: 78 kg (172 lb) 80.4 kg (177 lb 4 oz)    Examination:  General exam: Appears calm and comfortable  Respiratory system: Clear to auscultation. Respiratory effort normal. Cardiovascular system: S1 & S2 heard, RRR. No JVD, murmurs,  Gastrointestinal system: Abdomen is nondistended, soft and nontender.Normal bowel sounds heard. Central nervous system: Alert and oriented. No focal neurological deficits. Extremities: Symmetric 5 x 5 power. Skin: No rashes, lesions or ulcers Psychiatry: Judgement and insight appear normal. Mood & affect appropriate.     Data Reviewed:   CBC: Recent Labs  Lab 12/06/17 1524 12/07/17 0234  WBC 11.4* 11.9*  HGB 12.8* 10.9*  HCT 40.5 34.8*  MCV 93.8 93.3  PLT 178 335   Basic Metabolic Panel: Recent Labs  Lab 12/06/17 1524 12/06/17 2049 12/07/17 0234  NA 142 142 136  K 3.3* 4.2 3.2*  CL 99* 106 102  CO2 21* 22 23  GLUCOSE 125* 139* 126*  BUN 33* 33* 34*  CREATININE 2.57* 2.68* 2.77*  CALCIUM 8.2* 8.6* 8.1*   GFR: Estimated Creatinine Clearance: 20.5 mL/min (A) (by C-G formula based on SCr of 2.77 mg/dL (H)). Liver Function Tests: Recent Labs  Lab  12/06/17 2049 12/07/17 0234  AST 53* 30  ALT 16* 14*  ALKPHOS 57 50  BILITOT 0.6 0.8  PROT 6.6 5.9*  ALBUMIN 3.3* 3.0*   Recent Labs  Lab 12/06/17 2049  LIPASE 28   No results for input(s): AMMONIA in the last 168 hours. Coagulation Profile: Recent Labs  Lab 12/06/17 2130  INR 1.17   Cardiac Enzymes: No results for input(s): CKTOTAL, CKMB, CKMBINDEX, TROPONINI in the last 168 hours. BNP (last 3 results) No results for input(s): PROBNP in the last 8760 hours. HbA1C: No results for  input(s): HGBA1C in the last 72 hours. CBG: Recent Labs  Lab 12/06/17 1531  GLUCAP 121*   Lipid Profile: No results for input(s): CHOL, HDL, LDLCALC, TRIG, CHOLHDL, LDLDIRECT in the last 72 hours. Thyroid Function Tests: Recent Labs    12/07/17 0054  TSH 2.885   Anemia Panel: No results for input(s): VITAMINB12, FOLATE, FERRITIN, TIBC, IRON, RETICCTPCT in the last 72 hours. Urine analysis:    Component Value Date/Time   COLORURINE YELLOW 12/06/2017 [place] 12/06/2017 1524   LABSPEC 1.010 12/06/2017 1524   PHURINE 5.0 12/06/2017 1524   GLUCOSEU NEGATIVE 12/06/2017 1524   HGBUR MODERATE (A) 12/06/2017 1524   BILIRUBINUR NEGATIVE 12/06/2017 1524   KETONESUR NEGATIVE 12/06/2017 1524   PROTEINUR NEGATIVE 12/06/2017 1524   UROBILINOGEN 0.2 08/15/2013 1132   NITRITE NEGATIVE 12/06/2017 1524   LEUKOCYTESUR NEGATIVE 12/06/2017 1524   Sepsis Labs: @LABRCNTIP (procalcitonin:4,lacticidven:4)  ) Recent Results (from the past 240 hour(s))  Culture, blood (routine x 2)     Status: None (Preliminary result)   Collection Time: 12/06/17  4:35 PM  Result Value Ref Range Status   Specimen Description BLOOD RIGHT ANTECUBITAL  Final   Special Requests   Final    BOTTLES DRAWN AEROBIC AND ANAEROBIC Blood Culture adequate volume   Culture  Setup Time   Final    GRAM POSITIVE COCCI IN PAIRS IN CHAINS IN BOTH AEROBIC AND ANAEROBIC BOTTLES CRITICAL RESULT CALLED TO, READ BACK BY AND VERIFIED WITH: B MANCHERIL,PHARMD AT 1003 12/07/17 BY L BENFIELD Performed at [facility], [place] [name], [place], [place] [address]    Culture GRAM POSITIVE COCCI  Final   Report Status PENDING  Incomplete  Blood Culture ID Panel (Reflexed)     Status: Abnormal   Collection Time: 12/06/17  4:35 PM  Result Value Ref Range Status   Enterococcus species DETECTED (A) NOT DETECTED Final    Comment: CRITICAL RESULT CALLED TO, READ BACK BY AND VERIFIED WITH: B MANCHERIL,PHARMD AT 1003  12/07/17 BY L BENFIELD    Vancomycin resistance NOT DETECTED NOT DETECTED Final   Listeria monocytogenes NOT DETECTED NOT DETECTED Final   Staphylococcus species NOT DETECTED NOT DETECTED Final   Staphylococcus aureus NOT DETECTED NOT DETECTED Final   Streptococcus species NOT DETECTED NOT DETECTED Final   Streptococcus agalactiae NOT DETECTED NOT DETECTED Final   Streptococcus pneumoniae NOT DETECTED NOT DETECTED Final   Streptococcus pyogenes NOT DETECTED NOT DETECTED Final   Acinetobacter baumannii NOT DETECTED NOT DETECTED Final   Enterobacteriaceae species NOT DETECTED NOT DETECTED Final   Enterobacter cloacae complex NOT DETECTED NOT DETECTED Final   Escherichia coli NOT DETECTED NOT DETECTED Final   Klebsiella oxytoca NOT DETECTED NOT DETECTED Final   Klebsiella pneumoniae NOT DETECTED NOT DETECTED Final   Proteus species NOT DETECTED NOT DETECTED Final   Serratia marcescens NOT DETECTED NOT DETECTED Final   Haemophilus influenzae NOT DETECTED NOT  DETECTED Final   Neisseria meningitidis NOT DETECTED NOT DETECTED Final   Pseudomonas aeruginosa NOT DETECTED NOT DETECTED Final   Candida albicans NOT DETECTED NOT DETECTED Final   Candida glabrata NOT DETECTED NOT DETECTED Final   Candida krusei NOT DETECTED NOT DETECTED Final   Candida parapsilosis NOT DETECTED NOT DETECTED Final   Candida tropicalis NOT DETECTED NOT DETECTED Final    Comment: Performed at [facility], [place] [address], [place], [place] [address]         Radiology Studies: Ct Abdomen Pelvis Wo Contrast  Result Date: 12/07/2017 CLINICAL DATA:  84 year old male with abdominal pain. EXAM: CT ABDOMEN AND PELVIS WITHOUT CONTRAST TECHNIQUE: Multidetector CT imaging of the abdomen and pelvis was performed following the standard protocol without IV contrast. COMPARISON:  Abdominal CT dated 07/28/2017 FINDINGS: Evaluation of this exam is limited in the absence of intravenous contrast. Lower chest: There are  bibasilar linear atelectasis/scarring. There is mild cardiomegaly. Coronary vascular calcifications and pacemaker wires noted There is hypoattenuation of the cardiac blood pool suggestive of a degree of anemia. Clinical correlation is recommended. No intra-abdominal free air or free fluid. Hepatobiliary: No focal liver abnormality is seen. No gallstones, gallbladder wall thickening, or biliary dilatation. Pancreas: Unremarkable. No pancreatic ductal dilatation or surrounding inflammatory changes. Spleen: Normal in size without focal abnormality. Adrenals/Urinary Tract: The adrenal glands are unremarkable. Mild bilateral renal parenchyma atrophy there is no hydronephrosis or nephrolithiasis on either side. The visualized ureters appear unremarkable. There is trabeculated appearance of the bladder wall likely related to chronic bladder outlet obstruction or inflammation Stomach/Bowel: There is extensive sigmoid diverticulosis without active inflammatory changes. There is a moderate size hiatal hernia. No bowel obstruction or active inflammation. Normal appendix. Vascular/Lymphatic: There is advanced aortoiliac atherosclerotic disease. There is a 2.7 cm infrarenal aortic ectasia. Stable distal aortic calcified intimal flap or mural thrombus. The IVC is unremarkable on this noncontrast CT. No portal venous gas. There is no adenopathy. Reproductive: The prostate gland is grossly unremarkable. Other: None Musculoskeletal: Degenerative changes of the spine and osteopenia. No acute osseous pathology. IMPRESSION: 1. No acute intra-abdominal or pelvic pathology 2. Sigmoid diverticulosis. No bowel obstruction or active inflammation. Normal appendix. 3. Moderate size hiatal hernia similar to prior CT. 4. A 2.7 cm infrarenal aortic ectasia similar to prior CT Ectatic abdominal aorta at risk for aneurysm development. Recommend followup by ultrasound in 5 years. This recommendation follows ACR consensus guidelines: White Paper of  the ACR Incidental Findings Committee II on Vascular Findings. J Am Coll Radiol 2013; 33:295-188. 5.  Aortic Atherosclerosis (ICD10-I70.0). Electronically Signed   By: Anner Crete M.D.   On: 12/07/2017 04:03   Dg Abdomen Acute W/chest  Result Date: 12/06/2017 CLINICAL DATA:  Weakness.  Vomiting. EXAM: DG ABDOMEN ACUTE W/ 1V CHEST COMPARISON:  None. FINDINGS: There is a nodular density in the lateral right lung, unchanged since 2014. Linear calcification in the upper right chest is also unchanged since 2014. No new nodules or masses. Stable AICD device. The heart, hila, mediastinum are unremarkable. No other acute abnormalities in the chest. No free air or free fluid. There is a paucity of bowel gas but no evidence of obstruction. No renal stones identified. No other acute abnormalities. IMPRESSION: Paucity of bowel gas limits evaluation. Within this limitation, no abnormalities are identified. Electronically Signed   By: Dorise Bullion III M.D   On: 12/06/2017 16:37        Scheduled Meds: . amiodarone  200 mg Oral BID  .  apixaban  2.5 mg Oral BID  . carvedilol  3.125 mg Oral BID WC  . ezetimibe  10 mg Oral Daily  . ferrous sulfate  325 mg Oral Q supper  . iopamidol      . [START ON 12/08/2017] levothyroxine  25 mcg Oral Once per day on Mon Wed Fri  . levothyroxine  50 mcg Oral Once per day on Sun Tue Thu Sat  . multivitamin with minerals  1 tablet Oral QHS  . pantoprazole  40 mg Oral Daily  . rosuvastatin  20 mg Oral QHS  . sodium chloride flush  3 mL Intravenous Q12H  . tamsulosin  0.4 mg Oral QHS   Continuous Infusions: . sodium chloride    . cefTRIAXone (ROCEPHIN)  IV 1 g (12/07/17 1042)  . vancomycin    . [START ON 12/09/2017] vancomycin       LOS: 1 day    Time spent: 80min    Domenic Polite, MD [facility] Page via [email or web address], password [email or web address] After 7PM please contact night-coverage  12/07/2017, 11:00 AM

## 2017-12-07 NOTE — Progress Notes (Signed)
ANTICOAGULATION and ANTIBIOTIC CONSULT NOTE - Initial Consult  Pharmacy Consult for Eliquis and Cipro Indication: Afib and UTI  Allergies  Allergen Reactions  . Ramipril Other (See Comments)    cough  . Penicillins Itching and Rash    Has patient had a PCN reaction causing immediate rash, facial/tongue/throat swelling, SOB or lightheadedness with hypotension: No Has patient had a PCN reaction causing severe rash involving mucus membranes or skin necrosis: Yes-back and hands. Has patient had a PCN reaction that required hospitalization: No Has patient had a PCN reaction occurring within the last 10 years: Yes If all of the above answers are "NO", then may proceed with Cephalosporin use.    Patient Measurements: Height: 5\' 10"  (177.8 cm) Weight: 177 lb 4 oz (80.4 kg) IBW/kg (Calculated) : 73  Vital Signs: Temp: 102.1 F (38.9 C) (02/24 0001) Temp Source: Axillary (02/24 0001) BP: 109/50 (02/24 0000) Pulse Rate: 68 (02/24 0001)  Labs: Recent Labs    12/06/17 1524 12/06/17 2049 12/06/17 2130  HGB 12.8*  --   --   HCT 40.5  --   --   PLT 178  --   --   LABPROT  --   --  14.8  INR  --   --  1.17  CREATININE 2.57* 2.68*  --     Estimated Creatinine Clearance: 21.2 mL/min (A) (by C-G formula based on SCr of 2.68 mg/dL (H)).   Medical History: Past Medical History:  Diagnosis Date  . Acute bronchitis with COPD ([facility]) 09/10/2016  . Anemia   . Atrial fibrillation or flutter    maintaining sinus on amiodarone    . Bladder cancer ([facility]) dx'd 05/2012  . BPH (benign prostatic hyperplasia)   . CAD (coronary artery disease)     a. s/p anterior MI 12/05 c/b shock -> stent LAD   b. s/p stenting OM-1, 2/06  . CHF (congestive heart failure) ([facility])    due to ischemic CM  a. EF 20-30%. (Nov 2008)   b. s/p [employer] BiV-ICD    c. CPX 07/2008  pvo2 16.3 (63% predicted) slope 34 RER 1.08 O2 pulse 93%  . Cough    occasional /productive, no fever/ not new  . CRI (chronic renal  insufficiency)    (baseline 2.0-2.2)/ recent hospitalization 8/13  . GERD (gastroesophageal reflux disease)    hx Barretts esophagitis  . History of blood transfusion    following coumadin  usage  . HTN (hypertension)    EKG 05/14/12,Chest x ray 8/13, last ICD interrogation 8/13 EPIC  . Hyperlipidemia   . Hypothyroidism   . 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 Skin cancer    basal cells   facial x 4, 1 right forearm  . Sleep apnea    STOP BANG SCORE 4    Medications:  Medications Prior to Admission  Medication Sig Dispense Refill Last Dose  . acetaminophen (TYLENOL) 325 MG tablet Take 2 tablets (650 mg total) by mouth every 6 (six) hours as needed for mild pain (or Fever >/= 101). (Patient taking differently: Take 1,000 mg by mouth every 6 (six) hours as needed for mild pain or headache. )    at PRN  . amiodarone (PACERONE) 100 MG tablet Take 2 tablets (200 mg total) by mouth daily. (Patient taking differently: Take 200 mg by mouth 2 (two) times daily. )  30 tablet 0 12/06/2017 at Unknown time  . apixaban (ELIQUIS) 2.5 MG TABS tablet Take 1 tablet (2.5 mg total) by mouth 2 (two) times daily. 60 tablet 3 12/06/2017 at 1030  . carvedilol (COREG) 6.25 MG tablet TAKE 1 TABLET BY MOUTH TWICE DAILY WITH A MEAL (Patient taking differently: TAKE 1 TABLET (6.25mg ) BY MOUTH TWICE DAILY WITH A MEAL) 60 tablet 3 12/06/2017 at 1030  . diphenhydramine-acetaminophen (TYLENOL PM) 25-500 MG TABS tablet Take 1 tablet by mouth at bedtime as needed (sleep).    at PRN  . ezetimibe (ZETIA) 10 MG tablet TAKE ONE TABLET BY MOUTH ONCE DAILY WITH SUPPER (Patient taking differently: TAKE ONE TABLET (10mg ) BY MOUTH ONCE DAILY WITH SUPPER) 90 tablet 3 12/05/2017 at Unknown time  . ferrous sulfate ([employer] IRON) 325 (65 FE) MG tablet Take 325 mg by mouth daily with supper.    12/05/2017 at Unknown time  .  furosemide (LASIX) 20 MG tablet Take 2 tablets (40 mg total) by mouth daily. (Patient taking differently: Take 20 mg by mouth 2 (two) times daily. ) 60 tablet 0 12/06/2017 at Unknown time  . levothyroxine (SYNTHROID, LEVOTHROID) 50 MCG tablet Take 25-50 mcg by mouth daily before breakfast. Take 50 mcg (1 tablet) on Tues, Thurs, Sat, Sun and 25 mcg (1/2 tablet) on Mon, Wed, Fri   12/06/2017 at Unknown time  . Magnesium Hydroxide (MILK OF MAGNESIA PO) Take 20 mLs by mouth daily as needed (for constipation).    at PRN  . Multiple Vitamins-Minerals (CENTRUM SILVER PO) Take 1 tablet by mouth at bedtime.    12/06/2017 at Unknown time  . nitroGLYCERIN (NITROSTAT) 0.4 MG SL tablet DISSOLVE ONE TABLET UNDER THE TONGUE EVERY 5 MINUTES AS NEEDED FOR CHEST PAIN.  DO NOT EXCEED A TOTAL OF 3 DOSES IN 15 MINUTES 25 tablet 0  at PRN  . pantoprazole (PROTONIX) 40 MG tablet Take 40 mg by mouth daily.    12/06/2017 at Unknown time  . rosuvastatin (CRESTOR) 20 MG tablet Take 1 tablet (20 mg total) by mouth daily. (Patient taking differently: Take 20 mg by mouth at bedtime. ) 90 tablet 3 12/05/2017 at Unknown time  . sodium chloride (OCEAN) 0.65 % SOLN nasal spray Place 1-2 sprays into both nostrils daily as needed for congestion.     at PRN  . tamsulosin (FLOMAX) 0.4 MG CAPS capsule Take 0.4 mg by mouth at bedtime.    12/05/2017 at Unknown time   Scheduled:  . iopamidol      . amiodarone  200 mg Oral BID  . carvedilol  3.125 mg Oral BID WC  . ezetimibe  10 mg Oral Daily  . ferrous sulfate  325 mg Oral Q supper  . furosemide  20 mg Oral BID  . [START ON 12/08/2017] levothyroxine  25 mcg Oral Once per day on Mon Wed Fri  . levothyroxine  50 mcg Oral Once per day on Sun Tue Thu Sat  . multivitamin with minerals  1 tablet Oral QHS  . pantoprazole  40 mg Oral Daily  . rosuvastatin  20 mg Oral QHS  . sodium chloride flush  3 mL Intravenous Q12H  . tamsulosin  0.4 mg Oral QHS   Infusions:  . sodium chloride       Assessment: 84yo male c/o generalized weakness and N/V, found w/ UTI, to begin IV ABX and continue Eliquis for Afib.   Plan:  Will continue Eliquis 2.5mg  PO BID and start Cipro 200mg  IV Q24H.  Wynona Neat, PharmD, BCPS  12/07/2017,12:52 AM

## 2017-12-07 NOTE — Progress Notes (Addendum)
Upon admission, pt had a rectal temp of 103.5 around midnight. Tylenol was given. Pt's temp was relieved to 99.5. Will continue to monitor.   Fransico Michael, RN

## 2017-12-07 NOTE — Progress Notes (Addendum)
Pharmacy Antibiotic Note  [name] is a 84 y.o. male admitted on 12/06/2017 with enterococcus bacteremia allergic to PCN. 1/2 BCx with  enterococcus. He is on rocephin and vancomycin and plans to transition to daptomycin (mg/kg). ID seeing and plans for TTE/TEE  Plan: -Daptomycin 650 mg IV q48h -Baseline CK already ordered -CK weekly on Sunday -Reduce crestor to 10mg  po daily with renal function and watch CK -Will follow renal function, cultures and clinical progress   Height: 5\' 10"  (177.8 cm) Weight: 177 lb 4 oz (80.4 kg) IBW/kg (Calculated) : 73  Temp (24hrs), Avg:100.5 F (38.1 C), Min:98.5 F (36.9 C), Max:103.5 F (39.7 C)  Recent Labs  Lab 12/06/17 1524 12/06/17 1758 12/06/17 2049 12/06/17 2145 12/07/17 0234  WBC 11.4*  --   --   --  11.9*  CREATININE 2.57*  --  2.68*  --  2.77*  LATICACIDVEN  --  1.33  --  3.33*  --     Estimated Creatinine Clearance: 20.5 mL/min (A) (by C-G formula based on SCr of 2.77 mg/dL (H)).    Allergies  Allergen Reactions  . Ramipril Other (See Comments)    cough  . Penicillins Itching and Rash    Has patient had a PCN reaction causing immediate rash, facial/tongue/throat swelling, SOB or lightheadedness with hypotension: No Has patient had a PCN reaction causing severe rash involving mucus membranes or skin necrosis: Yes-back and hands. Has patient had a PCN reaction that required hospitalization: No Has patient had a PCN reaction occurring within the last 10 years: Yes If all of the above answers are "NO", then may proceed with Cephalosporin use.    Antimicrobials this admission: 2/24 dapto>> 2/24 vanc>> 2/24 2/24 rocephin> 2/24 2/23 cipro>> 2/24   Dose adjustments this admission: None   Microbiology results: 2/23 BCx: 1/2 enterococcus   Thank you for allowing pharmacy to be a part of this patient's care.  Hildred Laser, Pharm D 12/07/2017 12:51 PM

## 2017-12-07 NOTE — Progress Notes (Signed)
Pharmacy Antibiotic Note  [name] is a 84 y.o. male admitted on 12/06/2017 with enterococcus bacteremia allergic to PCN.  Pharmacy has been consulted for vancomycin dosing. Currently on IV ceftriaxone for UTI. 1/2 BCx with vanc susceptible enterococcus. WBC elevated. LA 3.33. Spiked fevers through the night. CrCl ~ 15-20 mL/min.   Plan: -Vancomycin 1500 mg IV once, then start Vancomycin 1 gm IV Q 48 hours -Continue ceftriaxone 1 gm IV Q 24 hours per MD -Monitor CBC, renal fx, cultures and clinical progress -VT at SS   Height: 5\' 10"  (177.8 cm) Weight: 177 lb 4 oz (80.4 kg) IBW/kg (Calculated) : 73  Temp (24hrs), Avg:100.9 F (38.3 C), Min:98.9 F (37.2 C), Max:103.5 F (39.7 C)  Recent Labs  Lab 12/06/17 1524 12/06/17 1758 12/06/17 2049 12/06/17 2145 12/07/17 0234  WBC 11.4*  --   --   --  11.9*  CREATININE 2.57*  --  2.68*  --  2.77*  LATICACIDVEN  --  1.33  --  3.33*  --     Estimated Creatinine Clearance: 20.5 mL/min (A) (by C-G formula based on SCr of 2.77 mg/dL (H)).    Allergies  Allergen Reactions  . Ramipril Other (See Comments)    cough  . Penicillins Itching and Rash    Has patient had a PCN reaction causing immediate rash, facial/tongue/throat swelling, SOB or lightheadedness with hypotension: No Has patient had a PCN reaction causing severe rash involving mucus membranes or skin necrosis: Yes-back and hands. Has patient had a PCN reaction that required hospitalization: No Has patient had a PCN reaction occurring within the last 10 years: Yes If all of the above answers are "NO", then may proceed with Cephalosporin use.    Antimicrobials this admission: Ceftriaxone 2/24 >>  Vanc 2/24 >>   Dose adjustments this admission: None   Microbiology results: 2/23 BCx: 1/2 enterococcus   Thank you for allowing pharmacy to be a part of this patient's care.  Albertina Parr, PharmD., BCPS Clinical Pharmacist Pager [phone]

## 2017-12-07 NOTE — Consult Note (Signed)
[facility]  Total days of antibiotics 2        Day 1 vanco        Day 1 ceftriaxone       Reason for Consult: enterococcal bacteremia   Referring Physician: Broadus John  Principal Problem:   Nausea & vomiting Active Problems:   Atrial fibrillation (HCC)   UTI (urinary tract infection)   CKD (chronic kidney disease) stage 4, GFR 15-29 ml/min (HCC)   Renal insufficiency    HPI: [name] is a 84 y.o. male with history of afib on coumadin, COPD ckd 4, hx of uti, CAD s/p PCI with AICD in 2006, admitted for acute onset of fevers, rigors, dysuria, nausea, vomiting. In the ED , he was found febrile, mild leukocytosis. He was started on ceftriaxone for presumed uti. Blood cx identified with enterococcus. Patient reports feeling better since yesterday, chills improved. No diarrhea, no sick contacts. He reports that nausea/vomiting started after having fevers, chills, and some low pelvic pain for a few days.   Allergic abtx - hx of diffuse pruritic rash with "cillins" when he had his aicd placed Past Medical History:  Diagnosis Date  . Acute bronchitis with COPD ([place]) 09/10/2016  . Anemia   . Atrial fibrillation or flutter    maintaining sinus on amiodarone    . Bladder cancer ([place]) dx'd 05/2012  . BPH (benign prostatic hyperplasia)   . CAD (coronary artery disease)     a. s/p anterior MI 12/05 c/b shock -> stent LAD   b. s/p stenting OM-1, 2/06  . CHF (congestive heart failure) (HCC)    due to ischemic CM  a. EF 20-30%. (Nov 2008)   b. s/p [employer] Jude BiV-ICD    c. CPX 07/2008  pvo2 16.3 (63% predicted) slope 34 RER 1.08 O2 pulse 93%  . Cough    occasional /productive, no fever/ not new  . CRI (chronic renal insufficiency)    (baseline 2.0-2.2)/ recent hospitalization 8/13  . GERD (gastroesophageal reflux disease)    hx Barretts esophagitis  . History of blood transfusion    following coumadin  usage  . HTN (hypertension)    EKG 05/14/12,Chest x ray 8/13,  last ICD interrogation 8/13 EPIC  . Hyperlipidemia   . Hypothyroidism   . 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 Skin cancer    basal cells   facial x 4, 1 right forearm  . Sleep apnea    STOP BANG SCORE 4    Allergies:  Allergies  Allergen Reactions  . Ramipril Other (See Comments)    cough  . Penicillins Itching and Rash    Has patient had a PCN reaction causing immediate rash, facial/tongue/throat swelling, SOB or lightheadedness with hypotension: No Has patient had a PCN reaction causing severe rash involving mucus membranes or skin necrosis: Yes-back and hands. Has patient had a PCN reaction that required hospitalization: No Has patient had a PCN reaction occurring within the last 10 years: Yes If all of the above answers are "NO", then may proceed with Cephalosporin use.    MEDICATIONS: . amiodarone  200 mg Oral BID  . apixaban  2.5 mg Oral BID  . carvedilol  3.125 mg Oral BID WC  . ezetimibe  10 mg Oral Daily  . ferrous sulfate  325 mg Oral Q supper  .  iopamidol      . [START ON 12/08/2017] levothyroxine  25 mcg Oral Once per day on Mon Wed Fri  . levothyroxine  50 mcg Oral Once per day on Sun Tue Thu Sat  . multivitamin with minerals  1 tablet Oral QHS  . pantoprazole  40 mg Oral Daily  . rosuvastatin  20 mg Oral QHS  . sodium chloride flush  3 mL Intravenous Q12H  . tamsulosin  0.4 mg Oral QHS    Social History   Tobacco Use  . Smoking status: Former Smoker    Types: Cigarettes, Cigars    Last attempt to quit: 03/02/2004    Years since quitting: 13.7  . Smokeless tobacco: Former Systems developer    Quit date: 05/20/1999  . Tobacco comment: quit in 2005  Substance Use Topics  . Alcohol use: No  . Drug use: No    Family History  Problem Relation Age of Onset  . Coronary artery disease Unknown   . Stroke Unknown      Review of Systems    Constitutional: positive for fever, chills, diaphoresis, activity change, appetite change, fatigue and unexpected weight change.  HENT: Negative for congestion, sore throat, rhinorrhea, sneezing, trouble swallowing and sinus pressure.  Eyes: Negative for photophobia and visual disturbance.  Respiratory: Negative for cough, chest tightness, shortness of breath, wheezing and stridor.  Cardiovascular: Negative for chest pain, palpitations and leg swelling.  Gastrointestinal: Negative for nausea, vomiting, abdominal pain, diarrhea, constipation, blood in stool, abdominal distention and anal bleeding.  Genitourinary: positive for dysuria, hematuria, flank pain and difficulty urinating.  Musculoskeletal: Negative for myalgias, back pain, joint swelling, arthralgias and gait problem.  Skin: Negative for color change, pallor, rash and wound.  Neurological: Negative for dizziness, tremors, weakness and light-headedness.  Hematological: Negative for adenopathy. Does not bruise/bleed easily.  Psychiatric/Behavioral: Negative for behavioral problems, confusion, sleep disturbance, dysphoric mood, decreased concentration and agitation.     OBJECTIVE: Temp:  [98.9 F (37.2 C)-103.5 F (39.7 C)] 99 F (37.2 C) (02/24 1123) Pulse Rate:  [62-85] 62 (02/24 1121) Resp:  [14-31] 22 (02/24 1121) BP: (83-139)/(44-104) 83/44 (02/24 1121) SpO2:  [86 %-97 %] 94 % (02/24 1121) Weight:  [172 lb (78 kg)-177 lb 4 oz (80.4 kg)] 177 lb 4 oz (80.4 kg) (02/24 0000) Physical Exam  Constitutional: He is oriented to person, place, and time. He appears well-developed and well-nourished. No distress.  HENT:  Mouth/Throat: Oropharynx is clear and moist. No oropharyngeal exudate.  Cardiovascular: Normal rate, regular rhythm and normal heart sounds. Exam reveals no gallop and no friction rub.  No murmur heard.  Pulmonary/Chest: Effort normal and breath sounds normal. No respiratory distress. He has no wheezes.  Abdominal:  Soft. Bowel sounds are normal. mildly distension. There is no tenderness.  Lymphadenopathy:  He has no cervical adenopathy.  Neurological: He is alert and oriented to person, place, and time.  Skin: Skin is warm and dry. No rash noted. No erythema.  Psychiatric: He has a normal mood and affect. His behavior is normal.     LABS: Results for orders placed or performed during the hospital encounter of 12/06/17 (from the past 48 hour(s))  Basic metabolic panel     Status: Abnormal   Collection Time: 12/06/17  3:24 PM  Result Value Ref Range   Sodium 142 135 - 145 mmol/L   Potassium 3.3 (L) 3.5 - 5.1 mmol/L   Chloride 99 (L) 101 - 111 mmol/L   CO2 21 (L) 22 - 32  mmol/L   Glucose, Bld 125 (H) 65 - 99 mg/dL   BUN 33 (H) 6 - 20 mg/dL   Creatinine, Ser 2.57 (H) 0.61 - 1.24 mg/dL   Calcium 8.2 (L) 8.9 - 10.3 mg/dL   GFR calc non Af Amer 21 (L) >60 mL/min   GFR calc Af Amer 25 (L) >60 mL/min    Comment: (NOTE) The eGFR has been calculated using the CKD EPI equation. This calculation has not been validated in all clinical situations. eGFR's persistently <60 mL/min signify possible Chronic Kidney Disease.    Anion gap 22 (H) 5 - 15    Comment: Performed at [facility], [place] [address] [place], [place] [address]  CBC     Status: Abnormal   Collection Time: 12/06/17  3:24 PM  Result Value Ref Range   WBC 11.4 (H) 4.0 - 10.5 K/uL   RBC 4.32 4.22 - 5.81 MIL/uL   Hemoglobin 12.8 (L) 13.0 - 17.0 g/dL   HCT 40.5 39.0 - 52.0 %   MCV 93.8 78.0 - 100.0 fL   MCH 29.6 26.0 - 34.0 pg   MCHC 31.6 30.0 - 36.0 g/dL   RDW 14.2 11.5 - 15.5 %   Platelets 178 150 - 400 K/uL    Comment: Performed at [facility], [place] [address], [place], [place] [address]  Urinalysis, Routine w reflex microscopic     Status: Abnormal   Collection Time: 12/06/17  3:24 PM  Result Value Ref Range   Color, Urine YELLOW YELLOW   APPearance CLEAR CLEAR   Specific Gravity, Urine 1.010 1.005 - 1.030   pH 5.0  5.0 - 8.0   Glucose, UA NEGATIVE NEGATIVE mg/dL   Hgb urine dipstick MODERATE (A) NEGATIVE   Bilirubin Urine NEGATIVE NEGATIVE   Ketones, ur NEGATIVE NEGATIVE mg/dL   Protein, ur NEGATIVE NEGATIVE mg/dL   Nitrite NEGATIVE NEGATIVE   Leukocytes, UA NEGATIVE NEGATIVE   RBC / HPF 6-30 0 - 5 RBC/hpf   WBC, UA 6-30 0 - 5 WBC/hpf   Bacteria, UA RARE (A) NONE SEEN   Squamous Epithelial / LPF 0-5 (A) NONE SEEN    Comment: Performed at [facility], [place] [address], [place], [place] [address]  CBG monitoring, ED     Status: Abnormal   Collection Time: 12/06/17  3:31 PM  Result Value Ref Range   Glucose-Capillary 121 (H) 65 - 99 mg/dL  I-stat troponin, ED     Status: None   Collection Time: 12/06/17  4:06 PM  Result Value Ref Range   Troponin i, poc 0.00 0.00 - 0.08 ng/mL   Comment 3            Comment: Due to the release kinetics of cTnI, a negative result within the first hours of the onset of symptoms does not rule out myocardial infarction with certainty. If myocardial infarction is still suspected, repeat the test at appropriate intervals.   Influenza panel by PCR (type A & B)     Status: None   Collection Time: 12/06/17  4:15 PM  Result Value Ref Range   Influenza A By PCR NEGATIVE NEGATIVE   Influenza B By PCR NEGATIVE NEGATIVE    Comment: (NOTE) The Xpert Xpress Flu assay is intended as an aid in the diagnosis of  influenza and should not be used as a sole basis for treatment.  This  assay is FDA approved for nasopharyngeal swab specimens only. Nasal  washings and aspirates  are unacceptable for Xpert Xpress Flu testing. Performed at [facility], [place] [address] [place], [place] [address]   Culture, blood (routine x 2)     Status: None (Preliminary result)   Collection Time: 12/06/17  4:35 PM  Result Value Ref Range   Specimen Description BLOOD RIGHT ANTECUBITAL    Special Requests      BOTTLES DRAWN AEROBIC AND ANAEROBIC Blood Culture adequate volume    Culture  Setup Time      GRAM POSITIVE COCCI IN PAIRS IN CHAINS IN BOTH AEROBIC AND ANAEROBIC BOTTLES CRITICAL RESULT CALLED TO, READ BACK BY AND VERIFIED WITH: B MANCHERIL,PHARMD AT 1003 12/07/17 BY L BENFIELD Performed at [facility], [place] [address], [place], [place] [address]    Culture GRAM POSITIVE COCCI    Report Status PENDING   Blood Culture ID Panel (Reflexed)     Status: Abnormal   Collection Time: 12/06/17  4:35 PM  Result Value Ref Range   Enterococcus species DETECTED (A) NOT DETECTED    Comment: CRITICAL RESULT CALLED TO, READ BACK BY AND VERIFIED WITH: B MANCHERIL,PHARMD AT 1003 12/07/17 BY L BENFIELD    Vancomycin resistance NOT DETECTED NOT DETECTED   Listeria monocytogenes NOT DETECTED NOT DETECTED   Staphylococcus species NOT DETECTED NOT DETECTED   Staphylococcus aureus NOT DETECTED NOT DETECTED   Streptococcus species NOT DETECTED NOT DETECTED   Streptococcus agalactiae NOT DETECTED NOT DETECTED   Streptococcus pneumoniae NOT DETECTED NOT DETECTED   Streptococcus pyogenes NOT DETECTED NOT DETECTED   Acinetobacter baumannii NOT DETECTED NOT DETECTED   Enterobacteriaceae species NOT DETECTED NOT DETECTED   Enterobacter cloacae complex NOT DETECTED NOT DETECTED   Escherichia coli NOT DETECTED NOT DETECTED   Klebsiella oxytoca NOT DETECTED NOT DETECTED   Klebsiella pneumoniae NOT DETECTED NOT DETECTED   Proteus species NOT DETECTED NOT DETECTED   Serratia marcescens NOT DETECTED NOT DETECTED   Haemophilus influenzae NOT DETECTED NOT DETECTED   Neisseria meningitidis NOT DETECTED NOT DETECTED   Pseudomonas aeruginosa NOT DETECTED NOT DETECTED   Candida albicans NOT DETECTED NOT DETECTED   Candida glabrata NOT DETECTED NOT DETECTED   Candida krusei NOT DETECTED NOT DETECTED   Candida parapsilosis NOT DETECTED NOT DETECTED   Candida tropicalis NOT DETECTED NOT DETECTED    Comment: Performed at [facility], [address] [place], [place] [address]    I-Stat CG4 Lactic Acid, ED     Status: None   Collection Time: 12/06/17  5:58 PM  Result Value Ref Range   Lactic Acid, Venous 1.33 0.5 - 1.9 mmol/L  I-Stat venous blood gas, ED     Status: Abnormal   Collection Time: 12/06/17  6:26 PM  Result Value Ref Range   pH, Ven 7.406 7.250 - 7.430   pCO2, Ven 46.4 44.0 - 60.0 mmHg   pO2, Ven 22.0 (LL) 32.0 - 45.0 mmHg   Bicarbonate 29.2 (H) 20.0 - 28.0 mmol/L   TCO2 31 22 - 32 mmol/L   O2 Saturation 37.0 %   Acid-Base Excess 4.0 (H) 0.0 - 2.0 mmol/L   Patient temperature HIDE    Collection site BRACHIAL ARTERY    Sample type VENOUS    Comment NOTIFIED PHYSICIAN   Hepatic function panel     Status: Abnormal   Collection Time: 12/06/17  8:49 PM  Result Value Ref Range   Total Protein 6.6 6.5 - 8.1 g/dL   Albumin 3.3 (L) 3.5 - 5.0 g/dL  AST 53 (H) 15 - 41 U/L   ALT 16 (L) 17 - 63 U/L   Alkaline Phosphatase 57 38 - 126 U/L   Total Bilirubin 0.6 0.3 - 1.2 mg/dL   Bilirubin, Direct 0.2 0.1 - 0.5 mg/dL   Indirect Bilirubin 0.4 0.3 - 0.9 mg/dL    Comment: Performed at [facility] [address], [place], [place] [address]  Lipase, blood     Status: None   Collection Time: 12/06/17  8:49 PM  Result Value Ref Range   Lipase 28 11 - 51 U/L    Comment: Performed at [facility], [place] [address] [place], [place] [address]  Basic metabolic panel     Status: Abnormal   Collection Time: 12/06/17  8:49 PM  Result Value Ref Range   Sodium 142 135 - 145 mmol/L   Potassium 4.2 3.5 - 5.1 mmol/L    Comment: DELTA CHECK NOTED   Chloride 106 101 - 111 mmol/L   CO2 22 22 - 32 mmol/L   Glucose, Bld 139 (H) 65 - 99 mg/dL   BUN 33 (H) 6 - 20 mg/dL   Creatinine, Ser 2.68 (H) 0.61 - 1.24 mg/dL   Calcium 8.6 (L) 8.9 - 10.3 mg/dL   GFR calc non Af Amer 20 (L) >60 mL/min   GFR calc Af Amer 24 (L) >60 mL/min    Comment: (NOTE) The eGFR has been calculated using the CKD EPI equation. This calculation has not been validated in all clinical  situations. eGFR's persistently <60 mL/min signify possible Chronic Kidney Disease.    Anion gap 14 5 - 15    Comment: Performed at [facility] [address] [place], [place] [address]  Brain natriuretic peptide     Status: Abnormal   Collection Time: 12/06/17  9:30 PM  Result Value Ref Range   B Natriuretic Peptide 400.9 (H) 0.0 - 100.0 pg/mL    Comment: Performed at [facility] [address] [place], [place] [address]  Protime-INR     Status: None   Collection Time: 12/06/17  9:30 PM  Result Value Ref Range   Prothrombin Time 14.8 11.4 - 15.2 seconds   INR 1.17     Comment: Performed at [facility] [address], [place], [place] [address]  I-Stat CG4 Lactic Acid, ED     Status: Abnormal   Collection Time: 12/06/17  9:45 PM  Result Value Ref Range   Lactic Acid, Venous 3.33 (HH) 0.5 - 1.9 mmol/L   Comment NOTIFIED PHYSICIAN   TSH     Status: None   Collection Time: 12/07/17 12:54 AM  Result Value Ref Range   TSH 2.885 0.350 - 4.500 uIU/mL    Comment: Performed by a 3rd Generation assay with a functional sensitivity of <=0.01 uIU/mL. Performed at [facility], [place] [address], [place], [place] [address]   Comprehensive metabolic panel     Status: Abnormal   Collection Time: 12/07/17  2:34 AM  Result Value Ref Range   Sodium 136 135 - 145 mmol/L   Potassium 3.2 (L) 3.5 - 5.1 mmol/L    Comment: DELTA CHECK NOTED   Chloride 102 101 - 111 mmol/L   CO2 23 22 - 32 mmol/L   Glucose, Bld 126 (H) 65 - 99 mg/dL   BUN 34 (H) 6 - 20 mg/dL   Creatinine, Ser 2.77 (H) 0.61 - 1.24 mg/dL   Calcium 8.1 (L) 8.9 -  10.3 mg/dL   Total Protein 5.9 (L) 6.5 - 8.1 g/dL   Albumin 3.0 (L) 3.5 - 5.0 g/dL   AST 30 15 - 41 U/L   ALT 14 (L) 17 - 63 U/L   Alkaline Phosphatase 50 38 - 126 U/L   Total Bilirubin 0.8 0.3 - 1.2 mg/dL   GFR calc non Af Amer 20 (L) >60 mL/min   GFR calc Af Amer 23 (L) >60 mL/min    Comment: (NOTE) The eGFR has been calculated using the  CKD EPI equation. This calculation has not been validated in all clinical situations. eGFR's persistently <60 mL/min signify possible Chronic Kidney Disease.    Anion gap 11 5 - 15    Comment: Performed at [facility] [address] [place], [place] [address]  CBC     Status: Abnormal   Collection Time: 12/07/17  2:34 AM  Result Value Ref Range   WBC 11.9 (H) 4.0 - 10.5 K/uL   RBC 3.73 (L) 4.22 - 5.81 MIL/uL   Hemoglobin 10.9 (L) 13.0 - 17.0 g/dL   HCT 34.8 (L) 39.0 - 52.0 %   MCV 93.3 78.0 - 100.0 fL   MCH 29.2 26.0 - 34.0 pg   MCHC 31.3 30.0 - 36.0 g/dL   RDW 14.3 11.5 - 15.5 %   Platelets 160 150 - 400 K/uL    Comment: Performed at [facility], [place] [address] [place], [place] [address]    MICRO: 2/23 blood cx 1 of 2 sets with enterococcus IMAGING: Ct Abdomen Pelvis Wo Contrast  Result Date: 12/07/2017 CLINICAL DATA:  84 year old male with abdominal pain. EXAM: CT ABDOMEN AND PELVIS WITHOUT CONTRAST TECHNIQUE: Multidetector CT imaging of the abdomen and pelvis was performed following the standard protocol without IV contrast. COMPARISON:  Abdominal CT dated 07/28/2017 FINDINGS: Evaluation of this exam is limited in the absence of intravenous contrast. Lower chest: There are bibasilar linear atelectasis/scarring. There is mild cardiomegaly. Coronary vascular calcifications and pacemaker wires noted There is hypoattenuation of the cardiac blood pool suggestive of a degree of anemia. Clinical correlation is recommended. No intra-abdominal free air or free fluid. Hepatobiliary: No focal liver abnormality is seen. No gallstones, gallbladder wall thickening, or biliary dilatation. Pancreas: Unremarkable. No pancreatic ductal dilatation or surrounding inflammatory changes. Spleen: Normal in size without focal abnormality. Adrenals/Urinary Tract: The adrenal glands are unremarkable. Mild bilateral renal parenchyma atrophy there is no hydronephrosis or nephrolithiasis on either  side. The visualized ureters appear unremarkable. There is trabeculated appearance of the bladder wall likely related to chronic bladder outlet obstruction or inflammation Stomach/Bowel: There is extensive sigmoid diverticulosis without active inflammatory changes. There is a moderate size hiatal hernia. No bowel obstruction or active inflammation. Normal appendix. Vascular/Lymphatic: There is advanced aortoiliac atherosclerotic disease. There is a 2.7 cm infrarenal aortic ectasia. Stable distal aortic calcified intimal flap or mural thrombus. The IVC is unremarkable on this noncontrast CT. No portal venous gas. There is no adenopathy. Reproductive: The prostate gland is grossly unremarkable. Other: None Musculoskeletal: Degenerative changes of the spine and osteopenia. No acute osseous pathology. IMPRESSION: 1. No acute intra-abdominal or pelvic pathology 2. Sigmoid diverticulosis. No bowel obstruction or active inflammation. Normal appendix. 3. Moderate size hiatal hernia similar to prior CT. 4. A 2.7 cm infrarenal aortic ectasia similar to prior CT Ectatic abdominal aorta at risk for aneurysm development. Recommend followup by ultrasound in 5 years. This recommendation follows ACR consensus guidelines: White Paper of the ACR Incidental Findings Committee  II on Vascular Findings. J Am Coll Radiol 2013; 03:474-259. 5.  Aortic Atherosclerosis (ICD10-I70.0). Electronically Signed   By: Anner Crete M.D.   On: 12/07/2017 04:03   Dg Abdomen Acute W/chest  Result Date: 12/06/2017 CLINICAL DATA:  Weakness.  Vomiting. EXAM: DG ABDOMEN ACUTE W/ 1V CHEST COMPARISON:  None. FINDINGS: There is a nodular density in the lateral right lung, unchanged since 2014. Linear calcification in the upper right chest is also unchanged since 2014. No new nodules or masses. Stable AICD device. The heart, hila, mediastinum are unremarkable. No other acute abnormalities in the chest. No free air or free fluid. There is a paucity of  bowel gas but no evidence of obstruction. No renal stones identified. No other acute abnormalities. IMPRESSION: Paucity of bowel gas limits evaluation. Within this limitation, no abnormalities are identified. Electronically Signed   By: Dorise Bullion III M.D   On: 12/06/2017 16:37    Assessment/Plan:  84yo M with CAD hx of AICD, admitted for N/V/fever/dysuria found to have secondary enterococcal bacteremia  - will change to daptomycin 26m/kg for now, await sensitivities to see how we can treat given "cillin" allergy - repeat blood cx tomorrow to see that he clears his bacteremia - will need TTE/TEE to rule out endocarditis  ckd 4= will watch for any worsening given he has received dose of vancomycin  Therapeutic drug monitoring =will check baseline ck

## 2017-12-07 NOTE — Progress Notes (Signed)
Care assumed from [facility] at 1500. Pt had small episode of emesis at 1515. Dr. Broadus John made aware, orders received, PRN Phenergen given.   Fritz Pickerel, RN

## 2017-12-07 NOTE — Progress Notes (Signed)
PHARMACY - PHYSICIAN COMMUNICATION CRITICAL VALUE ALERT - BLOOD CULTURE IDENTIFICATION (BCID)  [name] is an 84 y.o. male who presented to [facility] on 12/06/2017 with a chief complaint of n/v, flank pain, dysuria, fever and chills.   Assessment:  BCID with 1/2 enterococcus, no Van A gene detected. Pt continues to spike fevers and has an elevated WBC. Likely due to enterococcus bacteremia  Name of physician (or Provider) Contacted: Dr. Domenic Polite  Current antibiotics: Ceftriaxone 1 gm IV Q 24 hours for suspected UTI  Changes to prescribed antibiotics recommended: Add IV vancomycin Recommendations accepted by provider  Results for orders placed or performed during the hospital encounter of 12/06/17  Blood Culture ID Panel (Reflexed) (Collected: 12/06/2017  4:35 PM)  Result Value Ref Range   Enterococcus species DETECTED (A) NOT DETECTED   Vancomycin resistance NOT DETECTED NOT DETECTED   Listeria monocytogenes NOT DETECTED NOT DETECTED   Staphylococcus species NOT DETECTED NOT DETECTED   Staphylococcus aureus NOT DETECTED NOT DETECTED   Streptococcus species NOT DETECTED NOT DETECTED   Streptococcus agalactiae NOT DETECTED NOT DETECTED   Streptococcus pneumoniae NOT DETECTED NOT DETECTED   Streptococcus pyogenes NOT DETECTED NOT DETECTED   Acinetobacter baumannii NOT DETECTED NOT DETECTED   Enterobacteriaceae species NOT DETECTED NOT DETECTED   Enterobacter cloacae complex NOT DETECTED NOT DETECTED   Escherichia coli NOT DETECTED NOT DETECTED   Klebsiella oxytoca NOT DETECTED NOT DETECTED   Klebsiella pneumoniae NOT DETECTED NOT DETECTED   Proteus species NOT DETECTED NOT DETECTED   Serratia marcescens NOT DETECTED NOT DETECTED   Haemophilus influenzae NOT DETECTED NOT DETECTED   Neisseria meningitidis NOT DETECTED NOT DETECTED   Pseudomonas aeruginosa NOT DETECTED NOT DETECTED   Candida albicans NOT DETECTED NOT DETECTED   Candida glabrata NOT DETECTED NOT  DETECTED   Candida krusei NOT DETECTED NOT DETECTED   Candida parapsilosis NOT DETECTED NOT DETECTED   Candida tropicalis NOT DETECTED NOT DETECTED    Albertina Parr, PharmD., BCPS Clinical Pharmacist Pager [phone]

## 2017-12-08 ENCOUNTER — Inpatient Hospital Stay (HOSPITAL_COMMUNITY): Payer: Medicare Other

## 2017-12-08 DIAGNOSIS — R1031 Right lower quadrant pain: Secondary | ICD-10-CM

## 2017-12-08 DIAGNOSIS — R7881 Bacteremia: Secondary | ICD-10-CM

## 2017-12-08 DIAGNOSIS — I351 Nonrheumatic aortic (valve) insufficiency: Secondary | ICD-10-CM

## 2017-12-08 DIAGNOSIS — B952 Enterococcus as the cause of diseases classified elsewhere: Secondary | ICD-10-CM | POA: Diagnosis present

## 2017-12-08 DIAGNOSIS — N289 Disorder of kidney and ureter, unspecified: Secondary | ICD-10-CM

## 2017-12-08 LAB — BASIC METABOLIC PANEL
ANION GAP: 9 (ref 5–15)
BUN: 43 mg/dL — ABNORMAL HIGH (ref 6–20)
CALCIUM: 8.1 mg/dL — AB (ref 8.9–10.3)
CO2: 22 mmol/L (ref 22–32)
CREATININE: 3.49 mg/dL — AB (ref 0.61–1.24)
Chloride: 104 mmol/L (ref 101–111)
GFR calc Af Amer: 17 mL/min — ABNORMAL LOW (ref >60)
GFR calc non Af Amer: 15 mL/min — ABNORMAL LOW (ref >60)
Glucose, Bld: 121 mg/dL — ABNORMAL HIGH (ref 65–99)
Potassium: 3.9 mmol/L (ref 3.5–5.1)
SODIUM: 135 mmol/L (ref 135–145)

## 2017-12-08 LAB — CBC
HEMATOCRIT: 32.9 % — AB (ref 39.0–52.0)
HEMOGLOBIN: 10.2 g/dL — AB (ref 13.0–17.0)
MCH: 29.1 pg (ref 26.0–34.0)
MCHC: 31 g/dL (ref 30.0–36.0)
MCV: 93.7 fL (ref 78.0–100.0)
Platelets: 130 10*3/uL — ABNORMAL LOW (ref 150–400)
RBC: 3.51 MIL/uL — ABNORMAL LOW (ref 4.22–5.81)
RDW: 14.8 % (ref 11.5–15.5)
WBC: 13.8 10*3/uL — AB (ref 4.0–10.5)

## 2017-12-08 NOTE — Progress Notes (Signed)
Subjective: Patient denies chest pain, shortness of breath, fevers, chills, nausea, vomiting, rash, dysuria, hematuria. He does endorse a new R groin pain that started yesterday, w/o radiation, skin changes.  He reports not eating well due to not liking the hospital food.  Antibiotics:  Anti-infectives (From admission, onward)   Start     Dose/Rate Route Frequency Ordered Stop   12/09/17 1000  vancomycin (VANCOCIN) IVPB 1000 mg/200 mL premix  Status:  Discontinued     1,000 mg 200 mL/hr over 60 Minutes Intravenous Every 48 hours 12/07/17 1032 12/07/17 1241   12/08/17 0000  ciprofloxacin (CIPRO) IVPB 200 mg  Status:  Discontinued     200 mg 100 mL/hr over 60 Minutes Intravenous Every 24 hours 12/07/17 0102 12/07/17 0723   12/07/17 1400  DAPTOmycin (CUBICIN) 650 mg in sodium chloride 0.9 % IVPB     650 mg 226 mL/hr over 30 Minutes Intravenous Every 48 hours 12/07/17 1254     12/07/17 1100  vancomycin (VANCOCIN) 1,500 mg in sodium chloride 0.9 % 500 mL IVPB     1,500 mg 250 mL/hr over 120 Minutes Intravenous  Once 12/07/17 1016 12/07/17 1320   12/07/17 0800  cefTRIAXone (ROCEPHIN) 1 g in sodium chloride 0.9 % 100 mL IVPB  Status:  Discontinued     1 g 200 mL/hr over 30 Minutes Intravenous Every 24 hours 12/07/17 0723 12/07/17 1241   12/07/17 0115  ciprofloxacin (CIPRO) IVPB 200 mg     200 mg 100 mL/hr over 60 Minutes Intravenous  Once 12/07/17 0102 12/07/17 0320      Medications: Scheduled Meds: . amiodarone  200 mg Oral BID  . apixaban  2.5 mg Oral BID  . carvedilol  3.125 mg Oral BID WC  . ezetimibe  10 mg Oral Daily  . ferrous sulfate  325 mg Oral Q supper  . levothyroxine  25 mcg Oral Once per day on Mon Wed Fri  . levothyroxine  50 mcg Oral Once per day on Sun Tue Thu Sat  . multivitamin with minerals  1 tablet Oral QHS  . pantoprazole  40 mg Oral Daily  . rosuvastatin  10 mg Oral QHS  . sodium chloride flush  3 mL Intravenous Q12H  . tamsulosin  0.4 mg Oral  QHS   Continuous Infusions: . sodium chloride    . sodium chloride 75 mL/hr at 12/08/17 0038  . DAPTOmycin (CUBICIN)  IV Stopped (12/07/17 2017)   PRN Meds:.sodium chloride, acetaminophen **OR** acetaminophen, diphenhydrAMINE **AND** acetaminophen, ondansetron (ZOFRAN) IV, promethazine, sodium chloride, sodium chloride flush  Objective: Weight change:   Intake/Output Summary (Last 24 hours) at 12/08/2017 1040 Last data filed at 12/08/2017 0900 Gross per 24 hour  Intake 885 ml  Output 700 ml  Net 185 ml   Blood pressure (!) 96/45, pulse 64, temperature 100.3 F (37.9 C), temperature source Oral, resp. rate 20, height 5\' 10"  (1.778 m), weight 177 lb 4 oz (80.4 kg), SpO2 96 %. Temp:  [98.5 F (36.9 C)-101 F (38.3 C)] 100.3 F (37.9 C) (02/25 0900) Pulse Rate:  [62-85] 64 (02/25 0439) Resp:  [20-26] 20 (02/25 0439) BP: (83-142)/(44-109) 96/45 (02/25 0900) SpO2:  [91 %-98 %] 96 % (02/25 0439)  Physical Exam: General: Alert and awake, oriented x3, not in any acute distress. HEENT: anicteric sclera, pupils reactive to light and accommodation, EOMI CVS: RRR, no M/R/G Chest: CTAB, no increased work of breathing; ICD on left anterior chest w/o tenderness, induration. Abdomen:  soft nontender, nondistended, normal bowel sounds. Mild suprapubic discomfort Extremities: no edema Skin: no rashes Neuro: nonfocal  CBC: CBC Latest Ref Rng & Units 12/08/2017 12/07/2017 12/06/2017  WBC 4.0 - 10.5 K/uL 13.8(H) 11.9(H) 11.4(H)  Hemoglobin 13.0 - 17.0 g/dL 10.2(L) 10.9(L) 12.8(L)  Hematocrit 39.0 - 52.0 % 32.9(L) 34.8(L) 40.5  Platelets 150 - 400 K/uL 130(L) 160 178    BMET Recent Labs    12/07/17 0234 12/08/17 0703  NA 136 135  K 3.2* 3.9  CL 102 104  CO2 23 22  GLUCOSE 126* 121*  BUN 34* 43*  CREATININE 2.77* 3.49*  CALCIUM 8.1* 8.1*    Liver Panel  Recent Labs    12/06/17 2049 12/07/17 0234  PROT 6.6 5.9*  ALBUMIN 3.3* 3.0*  AST 53* 30  ALT 16* 14*  ALKPHOS 57 50    BILITOT 0.6 0.8  BILIDIR 0.2  --   IBILI 0.4  --     Sedimentation Rate No results for input(s): ESRSEDRATE in the last 72 hours. C-Reactive Protein No results for input(s): CRP in the last 72 hours.  Micro Results: Recent Results (from the past 720 hour(s))  Culture, blood (routine x 2)     Status: None (Preliminary result)   Collection Time: 12/06/17  3:40 PM  Result Value Ref Range Status   Specimen Description BLOOD LEFT ANTECUBITAL  Final   Special Requests   Final    BOTTLES DRAWN AEROBIC AND ANAEROBIC Blood Culture adequate volume   Culture   Final    NO GROWTH < 24 HOURS Performed at [facility], [address] [place], [place] [address]    Report Status PENDING  Incomplete  Culture, blood (routine x 2)     Status: None (Preliminary result)   Collection Time: 12/06/17  4:35 PM  Result Value Ref Range Status   Specimen Description BLOOD RIGHT ANTECUBITAL  Final   Special Requests   Final    BOTTLES DRAWN AEROBIC AND ANAEROBIC Blood Culture adequate volume   Culture  Setup Time   Final    GRAM POSITIVE COCCI IN PAIRS IN CHAINS IN BOTH AEROBIC AND ANAEROBIC BOTTLES CRITICAL RESULT CALLED TO, READ BACK BY AND VERIFIED WITH: B MANCHERIL,PHARMD AT 1003 12/07/17 BY L BENFIELD Performed at [facility], [place] [address], [place], [place] [address]    Culture GRAM POSITIVE COCCI  Final   Report Status PENDING  Incomplete  Blood Culture ID Panel (Reflexed)     Status: Abnormal   Collection Time: 12/06/17  4:35 PM  Result Value Ref Range Status   Enterococcus species DETECTED (A) NOT DETECTED Final    Comment: CRITICAL RESULT CALLED TO, READ BACK BY AND VERIFIED WITH: B MANCHERIL,PHARMD AT 1003 12/07/17 BY L BENFIELD    Vancomycin resistance NOT DETECTED NOT DETECTED Final   Listeria monocytogenes NOT DETECTED NOT DETECTED Final   Staphylococcus species NOT DETECTED NOT DETECTED Final   Staphylococcus aureus NOT DETECTED NOT DETECTED Final   Streptococcus  species NOT DETECTED NOT DETECTED Final   Streptococcus agalactiae NOT DETECTED NOT DETECTED Final   Streptococcus pneumoniae NOT DETECTED NOT DETECTED Final   Streptococcus pyogenes NOT DETECTED NOT DETECTED Final   Acinetobacter baumannii NOT DETECTED NOT DETECTED Final   Enterobacteriaceae species NOT DETECTED NOT DETECTED Final   Enterobacter cloacae complex NOT DETECTED NOT DETECTED Final   Escherichia coli NOT DETECTED NOT DETECTED Final   Klebsiella oxytoca NOT DETECTED NOT DETECTED Final   Klebsiella pneumoniae NOT  DETECTED NOT DETECTED Final   Proteus species NOT DETECTED NOT DETECTED Final   Serratia marcescens NOT DETECTED NOT DETECTED Final   Haemophilus influenzae NOT DETECTED NOT DETECTED Final   Neisseria meningitidis NOT DETECTED NOT DETECTED Final   Pseudomonas aeruginosa NOT DETECTED NOT DETECTED Final   Candida albicans NOT DETECTED NOT DETECTED Final   Candida glabrata NOT DETECTED NOT DETECTED Final   Candida krusei NOT DETECTED NOT DETECTED Final   Candida parapsilosis NOT DETECTED NOT DETECTED Final   Candida tropicalis NOT DETECTED NOT DETECTED Final    Comment: Performed at [facility], [place] [address], [place], [place] [address]    Studies/Results: Ct Abdomen Pelvis Wo Contrast  Result Date: 12/07/2017 CLINICAL DATA:  84 year old male with abdominal pain. EXAM: CT ABDOMEN AND PELVIS WITHOUT CONTRAST TECHNIQUE: Multidetector CT imaging of the abdomen and pelvis was performed following the standard protocol without IV contrast. COMPARISON:  Abdominal CT dated 07/28/2017 FINDINGS: Evaluation of this exam is limited in the absence of intravenous contrast. Lower chest: There are bibasilar linear atelectasis/scarring. There is mild cardiomegaly. Coronary vascular calcifications and pacemaker wires noted There is hypoattenuation of the cardiac blood pool suggestive of a degree of anemia. Clinical correlation is recommended. No intra-abdominal free air or free fluid.  Hepatobiliary: No focal liver abnormality is seen. No gallstones, gallbladder wall thickening, or biliary dilatation. Pancreas: Unremarkable. No pancreatic ductal dilatation or surrounding inflammatory changes. Spleen: Normal in size without focal abnormality. Adrenals/Urinary Tract: The adrenal glands are unremarkable. Mild bilateral renal parenchyma atrophy there is no hydronephrosis or nephrolithiasis on either side. The visualized ureters appear unremarkable. There is trabeculated appearance of the bladder wall likely related to chronic bladder outlet obstruction or inflammation Stomach/Bowel: There is extensive sigmoid diverticulosis without active inflammatory changes. There is a moderate size hiatal hernia. No bowel obstruction or active inflammation. Normal appendix. Vascular/Lymphatic: There is advanced aortoiliac atherosclerotic disease. There is a 2.7 cm infrarenal aortic ectasia. Stable distal aortic calcified intimal flap or mural thrombus. The IVC is unremarkable on this noncontrast CT. No portal venous gas. There is no adenopathy. Reproductive: The prostate gland is grossly unremarkable. Other: None Musculoskeletal: Degenerative changes of the spine and osteopenia. No acute osseous pathology. IMPRESSION: 1. No acute intra-abdominal or pelvic pathology 2. Sigmoid diverticulosis. No bowel obstruction or active inflammation. Normal appendix. 3. Moderate size hiatal hernia similar to prior CT. 4. A 2.7 cm infrarenal aortic ectasia similar to prior CT Ectatic abdominal aorta at risk for aneurysm development. Recommend followup by ultrasound in 5 years. This recommendation follows ACR consensus guidelines: White Paper of the ACR Incidental Findings Committee II on Vascular Findings. J Am Coll Radiol 2013; 60:454-098. 5.  Aortic Atherosclerosis (ICD10-I70.0). Electronically Signed   By: Anner Crete M.D.   On: 12/07/2017 04:03   Dg Abdomen Acute W/chest  Result Date: 12/06/2017 CLINICAL DATA:   Weakness.  Vomiting. EXAM: DG ABDOMEN ACUTE W/ 1V CHEST COMPARISON:  None. FINDINGS: There is a nodular density in the lateral right lung, unchanged since 2014. Linear calcification in the upper right chest is also unchanged since 2014. No new nodules or masses. Stable AICD device. The heart, hila, mediastinum are unremarkable. No other acute abnormalities in the chest. No free air or free fluid. There is a paucity of bowel gas but no evidence of obstruction. No renal stones identified. No other acute abnormalities. IMPRESSION: Paucity of bowel gas limits evaluation. Within this limitation, no abnormalities are identified. Electronically Signed   By: Dorise Bullion  III M.D   On: 12/06/2017 16:37    Assessment/Plan:  INTERVAL HISTORY:   Principal Problem:   Nausea & vomiting Active Problems:   Atrial fibrillation (HCC)   UTI (urinary tract infection)   CKD (chronic kidney disease) stage 4, GFR 15-29 ml/min (HCC)   Renal insufficiency  [name] is a 84 y.o. male with h/o AICD currently hospitalized with E faecalis bacteremia from urinary source. Patient was initially on Cipro, rocephin and vanc (x1 dose each) prior to blood cultures resulting; he has since been switched to only daptomycin. His fever curve is improving; however renal function is worsening with Cr to 3.5 from 2.7 yesterday. TTE has been done this morning and read is pending.   Plan: --continue daptomycin  --repeat blood cultures already drawn this morning --f/u TTE results   LOS: 2 days   Alphonzo Grieve, MD PGY - 2 Infectious Disease 12/08/2017, 10:40 AM

## 2017-12-08 NOTE — Progress Notes (Signed)
  Echocardiogram 2D Echocardiogram has been performed.  Matilde Bash 12/08/2017, 9:59 AM

## 2017-12-08 NOTE — Progress Notes (Signed)
PROGRESS NOTE    [name]  NFA:[id number] DOB: 1932-01-20 DOA: 12/06/2017 PCP: [name], MD  Brief Narrative: 84/M with Hypothyroidism, Hypertension, CAD, P.Afib, Severe Cardiomyopathy EF 25%, COPD, OSA presented to ED with nausea, vomiting and fever CT unremarkable, UA with 6-30WBC and rare bacteria. FOund to have Enterococcal bacteremia and AKI on CKD4  Assessment & Plan:   Enterococcus fecalis bacteremia -likely urinary source, urine not highly suggestive of infection though,  unfortunately Urine Cx was not sent on admission and too late now -continue IV Daptomycin -Day 2 -repeat Blood CX today -check 2D ECHO if neg forative will need TEE -BP remains soft, will continue IVF today -CT abd pelvis unremarkable -appreciate ID input  Chronic systolic CHF/ischemic cardiomyopathy -Status post AICD -Echo with EF of 20%  -Continue aspirin, Coreg -hold diuretics today, BP soft, IVF for 12-24H  Paroxysmal atrial fibrillation -chads2vasc score>3, continue Coreg, amiodarone and low dose apixaban  AKI on CKD4 -Followed by Dr. Pearson Grippe with [facility] -stable, Baseline creatinine is 2.5-3 range -creatinine bumped to 3.2 -could be due to Vanc and Sepsis, abx changed to Daptomycin, continue IVF today -He would be a very poor dialysis candidate when he gets to that stage  Hypertension -continue Coreg , hold diuretics today  COPD -Stable, not on home oxygen -continue nebs PRN  DVT prophylaxis: Eliquis Code Status: Full Code Family Communication: None at bedside Disposition Plan: Home pending improvement  Consultants:      Procedures:   Antimicrobials:  Antibiotics Given (last 72 hours)    Date/Time Action Medication Dose Rate   12/07/17 0219 New Bag/Given   ciprofloxacin (CIPRO) IVPB 200 mg 200 mg 100 mL/hr   12/07/17 1042 New Bag/Given   cefTRIAXone (ROCEPHIN) 1 g in sodium chloride 0.9 % 100 mL IVPB 1 g 200 mL/hr   12/07/17 1120  New Bag/Given   vancomycin (VANCOCIN) 1,500 mg in sodium chloride 0.9 % 500 mL IVPB 1,500 mg 250 mL/hr   12/07/17 1938 New Bag/Given  [On med cart to be delivered]   DAPTOmycin (CUBICIN) 650 mg in sodium chloride 0.9 % IVPB 650 mg 226 mL/hr      Subjective: -no vomiting, still weak overall, breathing ok  Objective: Vitals:   12/08/17 0200 12/08/17 0439 12/08/17 0900 12/08/17 1250  BP:  (!) 95/46 (!) 96/45 (!) 105/54  Pulse: 66 64  65  Resp: (!) 22 20  18   Temp:  99.5 F (37.5 C) 100.3 F (37.9 C) 100.1 F (37.8 C)  TempSrc:  Axillary Oral Oral  SpO2: 95% 96%  97%  Weight:      Height:        Intake/Output Summary (Last 24 hours) at 12/08/2017 1356 Last data filed at 12/08/2017 1300 Gross per 24 hour  Intake 1025 ml  Output 700 ml  Net 325 ml   Filed Weights   12/06/17 1522 12/07/17 0000  Weight: 78 kg (172 lb) 80.4 kg (177 lb 4 oz)    Examination:  Gen: Awake, Alert, Oriented X 3, no distress HEENT: PERRLA,  no JVD Lungs: Clear bilaterally CVS: S1S2/RRR, no murmurs Abd: soft, Non tender, non distended, BS present Extremities: No Cyanosis, Clubbing or edema Skin: no new rashes Psychiatry: Judgement and insight appear normal. Mood & affect appropriate.     Data Reviewed:   CBC: Recent Labs  Lab 12/06/17 1524 12/07/17 0234 12/08/17 0703  WBC 11.4* 11.9* 13.8*  HGB 12.8* 10.9* 10.2*  HCT 40.5 34.8* 32.9*  MCV 93.8 93.3  93.7  PLT 178 160 970*   Basic Metabolic Panel: Recent Labs  Lab 12/06/17 1524 12/06/17 2049 12/07/17 0234 12/08/17 0703  NA 142 142 136 135  K 3.3* 4.2 3.2* 3.9  CL 99* 106 102 104  CO2 21* 22 23 22   GLUCOSE 125* 139* 126* 121*  BUN 33* 33* 34* 43*  CREATININE 2.57* 2.68* 2.77* 3.49*  CALCIUM 8.2* 8.6* 8.1* 8.1*   GFR: Estimated Creatinine Clearance: 16.3 mL/min (A) (by C-G formula based on SCr of 3.49 mg/dL (H)). Liver Function Tests: Recent Labs  Lab 12/06/17 2049 12/07/17 0234  AST 53* 30  ALT 16* 14*  ALKPHOS 57  50  BILITOT 0.6 0.8  PROT 6.6 5.9*  ALBUMIN 3.3* 3.0*   Recent Labs  Lab 12/06/17 2049  LIPASE 28   No results for input(s): AMMONIA in the last 168 hours. Coagulation Profile: Recent Labs  Lab 12/06/17 2130  INR 1.17   Cardiac Enzymes: Recent Labs  Lab 12/07/17 1305  CKTOTAL 338   BNP (last 3 results) No results for input(s): PROBNP in the last 8760 hours. HbA1C: No results for input(s): HGBA1C in the last 72 hours. CBG: Recent Labs  Lab 12/06/17 1531  GLUCAP 121*   Lipid Profile: No results for input(s): CHOL, HDL, LDLCALC, TRIG, CHOLHDL, LDLDIRECT in the last 72 hours. Thyroid Function Tests: Recent Labs    12/07/17 0054  TSH 2.885   Anemia Panel: No results for input(s): VITAMINB12, FOLATE, FERRITIN, TIBC, IRON, RETICCTPCT in the last 72 hours. Urine analysis:    Component Value Date/Time   COLORURINE YELLOW 12/06/2017 [place] 12/06/2017 1524   LABSPEC 1.010 12/06/2017 1524   PHURINE 5.0 12/06/2017 1524   GLUCOSEU NEGATIVE 12/06/2017 1524   HGBUR MODERATE (A) 12/06/2017 1524   BILIRUBINUR NEGATIVE 12/06/2017 1524   KETONESUR NEGATIVE 12/06/2017 1524   PROTEINUR NEGATIVE 12/06/2017 1524   UROBILINOGEN 0.2 08/15/2013 1132   NITRITE NEGATIVE 12/06/2017 1524   LEUKOCYTESUR NEGATIVE 12/06/2017 1524   Sepsis Labs: @LABRCNTIP (procalcitonin:4,lacticidven:4)  ) Recent Results (from the past 240 hour(s))  Culture, blood (routine x 2)     Status: None (Preliminary result)   Collection Time: 12/06/17  3:40 PM  Result Value Ref Range Status   Specimen Description BLOOD LEFT ANTECUBITAL  Final   Special Requests   Final    BOTTLES DRAWN AEROBIC AND ANAEROBIC Blood Culture adequate volume   Culture   Final    NO GROWTH < 24 HOURS Performed at [facility], [place] [address] [place], [place] [address]    Report Status PENDING  Incomplete  Culture, blood (routine x 2)     Status: Abnormal (Preliminary result)   Collection Time:  12/06/17  4:35 PM  Result Value Ref Range Status   Specimen Description BLOOD RIGHT ANTECUBITAL  Final   Special Requests   Final    BOTTLES DRAWN AEROBIC AND ANAEROBIC Blood Culture adequate volume   Culture  Setup Time   Final    GRAM POSITIVE COCCI IN PAIRS IN CHAINS IN BOTH AEROBIC AND ANAEROBIC BOTTLES CRITICAL RESULT CALLED TO, READ BACK BY AND VERIFIED WITH: B MANCHERIL,PHARMD AT 1003 12/07/17 BY L BENFIELD    Culture (A)  Final    ENTEROCOCCUS FAECALIS SUSCEPTIBILITIES TO FOLLOW Performed at [facility], [place] [address] [place], [place] [address]    Report Status PENDING  Incomplete  Blood Culture ID Panel (Reflexed)     Status: Abnormal   Collection Time: 12/06/17  4:35 PM  Result Value Ref Range Status   Enterococcus species DETECTED (A) NOT DETECTED Final    Comment: CRITICAL RESULT CALLED TO, READ BACK BY AND VERIFIED WITH: B MANCHERIL,PHARMD AT 1003 12/07/17 BY L BENFIELD    Vancomycin resistance NOT DETECTED NOT DETECTED Final   Listeria monocytogenes NOT DETECTED NOT DETECTED Final   Staphylococcus species NOT DETECTED NOT DETECTED Final   Staphylococcus aureus NOT DETECTED NOT DETECTED Final   Streptococcus species NOT DETECTED NOT DETECTED Final   Streptococcus agalactiae NOT DETECTED NOT DETECTED Final   Streptococcus pneumoniae NOT DETECTED NOT DETECTED Final   Streptococcus pyogenes NOT DETECTED NOT DETECTED Final   Acinetobacter baumannii NOT DETECTED NOT DETECTED Final   Enterobacteriaceae species NOT DETECTED NOT DETECTED Final   Enterobacter cloacae complex NOT DETECTED NOT DETECTED Final   Escherichia coli NOT DETECTED NOT DETECTED Final   Klebsiella oxytoca NOT DETECTED NOT DETECTED Final   Klebsiella pneumoniae NOT DETECTED NOT DETECTED Final   Proteus species NOT DETECTED NOT DETECTED Final   Serratia marcescens NOT DETECTED NOT DETECTED Final   Haemophilus influenzae NOT DETECTED NOT DETECTED Final   Neisseria meningitidis NOT DETECTED NOT  DETECTED Final   Pseudomonas aeruginosa NOT DETECTED NOT DETECTED Final   Candida albicans NOT DETECTED NOT DETECTED Final   Candida glabrata NOT DETECTED NOT DETECTED Final   Candida krusei NOT DETECTED NOT DETECTED Final   Candida parapsilosis NOT DETECTED NOT DETECTED Final   Candida tropicalis NOT DETECTED NOT DETECTED Final    Comment: Performed at [facility], [address] [place], [place] [address]         Radiology Studies: Ct Abdomen Pelvis Wo Contrast  Result Date: 12/07/2017 CLINICAL DATA:  84 year old male with abdominal pain. EXAM: CT ABDOMEN AND PELVIS WITHOUT CONTRAST TECHNIQUE: Multidetector CT imaging of the abdomen and pelvis was performed following the standard protocol without IV contrast. COMPARISON:  Abdominal CT dated 07/28/2017 FINDINGS: Evaluation of this exam is limited in the absence of intravenous contrast. Lower chest: There are bibasilar linear atelectasis/scarring. There is mild cardiomegaly. Coronary vascular calcifications and pacemaker wires noted There is hypoattenuation of the cardiac blood pool suggestive of a degree of anemia. Clinical correlation is recommended. No intra-abdominal free air or free fluid. Hepatobiliary: No focal liver abnormality is seen. No gallstones, gallbladder wall thickening, or biliary dilatation. Pancreas: Unremarkable. No pancreatic ductal dilatation or surrounding inflammatory changes. Spleen: Normal in size without focal abnormality. Adrenals/Urinary Tract: The adrenal glands are unremarkable. Mild bilateral renal parenchyma atrophy there is no hydronephrosis or nephrolithiasis on either side. The visualized ureters appear unremarkable. There is trabeculated appearance of the bladder wall likely related to chronic bladder outlet obstruction or inflammation Stomach/Bowel: There is extensive sigmoid diverticulosis without active inflammatory changes. There is a moderate size hiatal hernia. No bowel obstruction or active  inflammation. Normal appendix. Vascular/Lymphatic: There is advanced aortoiliac atherosclerotic disease. There is a 2.7 cm infrarenal aortic ectasia. Stable distal aortic calcified intimal flap or mural thrombus. The IVC is unremarkable on this noncontrast CT. No portal venous gas. There is no adenopathy. Reproductive: The prostate gland is grossly unremarkable. Other: None Musculoskeletal: Degenerative changes of the spine and osteopenia. No acute osseous pathology. IMPRESSION: 1. No acute intra-abdominal or pelvic pathology 2. Sigmoid diverticulosis. No bowel obstruction or active inflammation. Normal appendix. 3. Moderate size hiatal hernia similar to prior CT. 4. A 2.7 cm infrarenal aortic ectasia similar to prior CT Ectatic abdominal aorta at risk for aneurysm development. Recommend followup  by ultrasound in 5 years. This recommendation follows ACR consensus guidelines: White Paper of the ACR Incidental Findings Committee II on Vascular Findings. J Am Coll Radiol 2013; 38:453-646. 5.  Aortic Atherosclerosis (ICD10-I70.0). Electronically Signed   By: Anner Crete M.D.   On: 12/07/2017 04:03   Dg Abdomen Acute W/chest  Result Date: 12/06/2017 CLINICAL DATA:  Weakness.  Vomiting. EXAM: DG ABDOMEN ACUTE W/ 1V CHEST COMPARISON:  None. FINDINGS: There is a nodular density in the lateral right lung, unchanged since 2014. Linear calcification in the upper right chest is also unchanged since 2014. No new nodules or masses. Stable AICD device. The heart, hila, mediastinum are unremarkable. No other acute abnormalities in the chest. No free air or free fluid. There is a paucity of bowel gas but no evidence of obstruction. No renal stones identified. No other acute abnormalities. IMPRESSION: Paucity of bowel gas limits evaluation. Within this limitation, no abnormalities are identified. Electronically Signed   By: Dorise Bullion III M.D   On: 12/06/2017 16:37        Scheduled Meds: . amiodarone  200 mg  Oral BID  . apixaban  2.5 mg Oral BID  . carvedilol  3.125 mg Oral BID WC  . ezetimibe  10 mg Oral Daily  . ferrous sulfate  325 mg Oral Q supper  . levothyroxine  25 mcg Oral Once per day on Mon Wed Fri  . levothyroxine  50 mcg Oral Once per day on Sun Tue Thu Sat  . multivitamin with minerals  1 tablet Oral QHS  . pantoprazole  40 mg Oral Daily  . rosuvastatin  10 mg Oral QHS  . sodium chloride flush  3 mL Intravenous Q12H  . tamsulosin  0.4 mg Oral QHS   Continuous Infusions: . sodium chloride    . sodium chloride 75 mL/hr at 12/08/17 0038  . DAPTOmycin (CUBICIN)  IV Stopped (12/07/17 2017)     LOS: 2 days    Time spent: 17min    Domenic Polite, MD [facility] Page via [email or web address], password [email or web address] After 7PM please contact night-coverage  12/08/2017, 1:56 PM

## 2017-12-09 DIAGNOSIS — N179 Acute kidney failure, unspecified: Secondary | ICD-10-CM

## 2017-12-09 LAB — BASIC METABOLIC PANEL
Anion gap: 9 (ref 5–15)
BUN: 45 mg/dL — AB (ref 6–20)
CO2: 20 mmol/L — ABNORMAL LOW (ref 22–32)
CREATININE: 3.27 mg/dL — AB (ref 0.61–1.24)
Calcium: 8 mg/dL — ABNORMAL LOW (ref 8.9–10.3)
Chloride: 108 mmol/L (ref 101–111)
GFR, EST AFRICAN AMERICAN: 19 mL/min — AB (ref >60)
GFR, EST NON AFRICAN AMERICAN: 16 mL/min — AB (ref >60)
Glucose, Bld: 121 mg/dL — ABNORMAL HIGH (ref 65–99)
POTASSIUM: 3.6 mmol/L (ref 3.5–5.1)
Sodium: 137 mmol/L (ref 135–145)

## 2017-12-09 LAB — ECHOCARDIOGRAM COMPLETE
HEIGHTINCHES: 70 in
Weight: 2836 oz

## 2017-12-09 LAB — CULTURE, BLOOD (ROUTINE X 2): Special Requests: ADEQUATE

## 2017-12-09 MED ORDER — SODIUM CHLORIDE 0.9 % IV SOLN
INTRAVENOUS | Status: DC
  Administered 2017-12-09: 22:00:00 via INTRAVENOUS

## 2017-12-09 NOTE — Discharge Instructions (Signed)

## 2017-12-09 NOTE — Progress Notes (Signed)
[facility] Coordinator will follow Mr. Onley with the ID team to support IV ABX at home if needed at DC.   If patient discharges after hours, please call [phone].   Larry Sierras 12/09/2017, 1:36 PM

## 2017-12-09 NOTE — Progress Notes (Signed)
Subjective: Patient denies fevers, chills, nausea, abd pain, dysuria. He has a new minimally productive cough since last night which has improved using spirometry.  Antibiotics:  Anti-infectives (From admission, onward)   Start     Dose/Rate Route Frequency Ordered Stop   12/09/17 1000  vancomycin (VANCOCIN) IVPB 1000 mg/200 mL premix  Status:  Discontinued     1,000 mg 200 mL/hr over 60 Minutes Intravenous Every 48 hours 12/07/17 1032 12/07/17 1241   12/08/17 0000  ciprofloxacin (CIPRO) IVPB 200 mg  Status:  Discontinued     200 mg 100 mL/hr over 60 Minutes Intravenous Every 24 hours 12/07/17 0102 12/07/17 0723   12/07/17 1400  DAPTOmycin (CUBICIN) 650 mg in sodium chloride 0.9 % IVPB     650 mg 226 mL/hr over 30 Minutes Intravenous Every 48 hours 12/07/17 1254     12/07/17 1100  vancomycin (VANCOCIN) 1,500 mg in sodium chloride 0.9 % 500 mL IVPB     1,500 mg 250 mL/hr over 120 Minutes Intravenous  Once 12/07/17 1016 12/07/17 1320   12/07/17 0800  cefTRIAXone (ROCEPHIN) 1 g in sodium chloride 0.9 % 100 mL IVPB  Status:  Discontinued     1 g 200 mL/hr over 30 Minutes Intravenous Every 24 hours 12/07/17 0723 12/07/17 1241   12/07/17 0115  ciprofloxacin (CIPRO) IVPB 200 mg     200 mg 100 mL/hr over 60 Minutes Intravenous  Once 12/07/17 0102 12/07/17 0320      Medications: Scheduled Meds: . amiodarone  200 mg Oral BID  . apixaban  2.5 mg Oral BID  . carvedilol  3.125 mg Oral BID WC  . ezetimibe  10 mg Oral Daily  . ferrous sulfate  325 mg Oral Q supper  . levothyroxine  25 mcg Oral Once per day on Mon Wed Fri  . levothyroxine  50 mcg Oral Once per day on Sun Tue Thu Sat  . multivitamin with minerals  1 tablet Oral QHS  . pantoprazole  40 mg Oral Daily  . rosuvastatin  10 mg Oral QHS  . sodium chloride flush  3 mL Intravenous Q12H  . tamsulosin  0.4 mg Oral QHS   Continuous Infusions: . sodium chloride    . sodium chloride 75 mL/hr at 12/09/17 0454  . DAPTOmycin  (CUBICIN)  IV Stopped (12/07/17 2017)   PRN Meds:.sodium chloride, acetaminophen **OR** acetaminophen, diphenhydrAMINE **AND** acetaminophen, ondansetron (ZOFRAN) IV, promethazine, sodium chloride, sodium chloride flush  Objective: Weight change:   Intake/Output Summary (Last 24 hours) at 12/09/2017 0830 Last data filed at 12/09/2017 0452 Gross per 24 hour  Intake 600 ml  Output 800 ml  Net -200 ml   Blood pressure 101/63, pulse 86, temperature 99.3 F (37.4 C), temperature source Oral, resp. rate (!) 26, height 5\' 10"  (1.778 m), weight 185 lb 6.5 oz (84.1 kg), SpO2 93 %. Temp:  [99.3 F (37.4 C)-100.3 F (37.9 C)] 99.3 F (37.4 C) (02/26 0443) Pulse Rate:  [64-97] 86 (02/26 0443) Resp:  [18-26] 26 (02/26 0443) BP: (87-105)/(43-63) 101/63 (02/26 0443) SpO2:  [93 %-97 %] 93 % (02/26 0443) Weight:  [185 lb 6.5 oz (84.1 kg)] 185 lb 6.5 oz (84.1 kg) (02/26 0443)  Physical Exam: General: Alert and awake, oriented x3, not in any acute distress. HEENT: anicteric sclera, EOMI CVS: RRR, no M/R/G Chest: CTAB, no increased work of breathing; ICD on left anterior chest w/o tenderness, induration. Abdomen: soft nontender, nondistended, normal bowel sounds.  Extremities: no  edema Skin: no rashes Neuro: nonfocal  CBC: CBC Latest Ref Rng & Units 12/08/2017 12/07/2017 12/06/2017  WBC 4.0 - 10.5 K/uL 13.8(H) 11.9(H) 11.4(H)  Hemoglobin 13.0 - 17.0 g/dL 10.2(L) 10.9(L) 12.8(L)  Hematocrit 39.0 - 52.0 % 32.9(L) 34.8(L) 40.5  Platelets 150 - 400 K/uL 130(L) 160 178    BMET Recent Labs    12/07/17 0234 12/08/17 0703  NA 136 135  K 3.2* 3.9  CL 102 104  CO2 23 22  GLUCOSE 126* 121*  BUN 34* 43*  CREATININE 2.77* 3.49*  CALCIUM 8.1* 8.1*    Liver Panel  Recent Labs    12/06/17 2049 12/07/17 0234  PROT 6.6 5.9*  ALBUMIN 3.3* 3.0*  AST 53* 30  ALT 16* 14*  ALKPHOS 57 50  BILITOT 0.6 0.8  BILIDIR 0.2  --   IBILI 0.4  --     Sedimentation Rate No results for input(s):  ESRSEDRATE in the last 72 hours. C-Reactive Protein No results for input(s): CRP in the last 72 hours.  Micro Results: Recent Results (from the past 720 hour(s))  Culture, blood (routine x 2)     Status: None (Preliminary result)   Collection Time: 12/06/17  3:40 PM  Result Value Ref Range Status   Specimen Description BLOOD LEFT ANTECUBITAL  Final   Special Requests   Final    BOTTLES DRAWN AEROBIC AND ANAEROBIC Blood Culture adequate volume   Culture   Final    NO GROWTH 2 DAYS Performed at [facility], [address] [place], [place] [address]    Report Status PENDING  Incomplete  Culture, blood (routine x 2)     Status: Abnormal   Collection Time: 12/06/17  4:35 PM  Result Value Ref Range Status   Specimen Description BLOOD RIGHT ANTECUBITAL  Final   Special Requests   Final    BOTTLES DRAWN AEROBIC AND ANAEROBIC Blood Culture adequate volume   Culture  Setup Time   Final    GRAM POSITIVE COCCI IN PAIRS IN CHAINS IN BOTH AEROBIC AND ANAEROBIC BOTTLES CRITICAL RESULT CALLED TO, READ BACK BY AND VERIFIED WITH: B MANCHERIL,PHARMD AT 1003 12/07/17 BY L BENFIELD Performed at [facility], [place] [address] [place], [place] [address]    Culture ENTEROCOCCUS FAECALIS (A)  Final   Report Status 12/09/2017 FINAL  Final   Organism ID, Bacteria ENTEROCOCCUS FAECALIS  Final      Susceptibility   Enterococcus faecalis - MIC*    AMPICILLIN <=2 SENSITIVE Sensitive     VANCOMYCIN 1 SENSITIVE Sensitive     GENTAMICIN SYNERGY SENSITIVE Sensitive     * ENTEROCOCCUS FAECALIS  Blood Culture ID Panel (Reflexed)     Status: Abnormal   Collection Time: 12/06/17  4:35 PM  Result Value Ref Range Status   Enterococcus species DETECTED (A) NOT DETECTED Final    Comment: CRITICAL RESULT CALLED TO, READ BACK BY AND VERIFIED WITH: B MANCHERIL,PHARMD AT 1003 12/07/17 BY L BENFIELD    Vancomycin resistance NOT DETECTED NOT DETECTED Final   Listeria monocytogenes NOT DETECTED NOT DETECTED  Final   Staphylococcus species NOT DETECTED NOT DETECTED Final   Staphylococcus aureus NOT DETECTED NOT DETECTED Final   Streptococcus species NOT DETECTED NOT DETECTED Final   Streptococcus agalactiae NOT DETECTED NOT DETECTED Final   Streptococcus pneumoniae NOT DETECTED NOT DETECTED Final   Streptococcus pyogenes NOT DETECTED NOT DETECTED Final   Acinetobacter baumannii NOT DETECTED NOT DETECTED Final   Enterobacteriaceae species NOT  DETECTED NOT DETECTED Final   Enterobacter cloacae complex NOT DETECTED NOT DETECTED Final   Escherichia coli NOT DETECTED NOT DETECTED Final   Klebsiella oxytoca NOT DETECTED NOT DETECTED Final   Klebsiella pneumoniae NOT DETECTED NOT DETECTED Final   Proteus species NOT DETECTED NOT DETECTED Final   Serratia marcescens NOT DETECTED NOT DETECTED Final   Haemophilus influenzae NOT DETECTED NOT DETECTED Final   Neisseria meningitidis NOT DETECTED NOT DETECTED Final   Pseudomonas aeruginosa NOT DETECTED NOT DETECTED Final   Candida albicans NOT DETECTED NOT DETECTED Final   Candida glabrata NOT DETECTED NOT DETECTED Final   Candida krusei NOT DETECTED NOT DETECTED Final   Candida parapsilosis NOT DETECTED NOT DETECTED Final   Candida tropicalis NOT DETECTED NOT DETECTED Final    Comment: Performed at [facility], [place] [address], [place], [place] [address]    Studies/Results: No results found.  Assessment/Plan:  INTERVAL HISTORY:   Principal Problem:   Nausea & vomiting Active Problems:   Atrial fibrillation (HCC)   UTI (urinary tract infection)   CKD (chronic kidney disease) stage 4, GFR 15-29 ml/min (HCC)   Renal insufficiency   Enterococcal bacteremia  [name] is a 84 y.o. male with h/o AICD currently hospitalized with E faecalis bacteremia from urinary source. Patient was initially on Cipro, rocephin and vanc (x1 dose each) prior to blood cultures resulting; he has since been switched to only daptomycin in setting of AKI on  CKD. No further fevers. TTE did not reveal vegetation.  Plan: --continue daptomycin; length of treatment depends on echo results --f/u repeat blood cultures from 2/25 --scheduled for TEE tomorrow --encouraged incentive spirometer and flutter valve use for likely atelectasis   LOS: 3 days   Alphonzo Grieve, MD PGY - 2 Infectious Disease 12/09/2017, 8:30 AM

## 2017-12-09 NOTE — Progress Notes (Signed)
PROGRESS NOTE    [name]  RWE:[id number] DOB: [birth year] DOA: 12/06/2017 PCP: [name], MD  Brief Narrative: [name] with Hypothyroidism, Hypertension, CAD, P.Afib, Severe Cardiomyopathy EF 25%, COPD, OSA presented to ED with nausea, vomiting and fever CT unremarkable, UA with 6-30WBC and rare bacteria. FOund to have Enterococcal bacteremia and AKI on CKD4  Assessment & Plan:   Sepsis/Enterococcus fecalis bacteremia -likely urinary source, urine not highly suggestive of infection though,  unfortunately Urine Cx was not sent on admission and too late now -ID following, continue IV Daptomycin -Day 3 -repeat Blood CX 2/25 pending -2D ECHO with no vegetations, called Cards to request TEE-high referral volume will likely not be tomorrow -stop IVF -CT abd pelvis unremarkable  Chronic systolic CHF/ischemic cardiomyopathy -Status post AICD, Echo with EF of 20%  -Continue aspirin, Coreg -stop IVF today -resume diuretics tomorrow  Paroxysmal atrial fibrillation -chads2vasc score>3, continue Coreg, amiodarone and low dose apixaban  AKI on CKD4 -Followed by Dr. Pearson Grippe with [facility] -stable, Baseline creatinine is 2.5-3 range -creatinine bumped to 3.4 -could be due to Vanc and Sepsis, abx changed to Daptomycin,  -creatinine 3.2 now which is close to his baseline, stop IVF , monitor -He would be a very poor dialysis candidate when he gets to that stage  Hypertension -continue Coreg , hold diuretics today  COPD -Stable, not on home oxygen -continue nebs PRN  DVT prophylaxis: Eliquis Code Status: Full Code Family Communication: None at bedside Disposition Plan: Home pending improvement  Consultants:      Procedures:   Antimicrobials:  Antibiotics Given (last 72 hours)    Date/Time Action Medication Dose Rate   12/07/17 0219 New Bag/Given   ciprofloxacin (CIPRO) IVPB 200 mg 200 mg 100 mL/hr   12/07/17 1042 New Bag/Given   cefTRIAXone  (ROCEPHIN) 1 g in sodium chloride 0.9 % 100 mL IVPB 1 g 200 mL/hr   12/07/17 1120 New Bag/Given   vancomycin (VANCOCIN) 1,500 mg in sodium chloride 0.9 % 500 mL IVPB 1,500 mg 250 mL/hr   12/07/17 1938 New Bag/Given  [On med cart to be delivered]   DAPTOmycin (CUBICIN) 650 mg in sodium chloride 0.9 % IVPB 650 mg 226 mL/hr      Subjective: -feels better, wondering when he can go home Objective: Vitals:   12/08/17 2045 12/09/17 0031 12/09/17 0443 12/09/17 0845  BP: (!) 93/43 (!) 87/56 101/63   Pulse: 64 97 86   Resp: (!) 24 (!) 22 (!) 26   Temp: 99.4 F (37.4 C) 99.3 F (37.4 C) 99.3 F (37.4 C)   TempSrc: Oral Oral Oral   SpO2: 96% 95% 93% 95%  Weight:   84.1 kg (185 lb 6.5 oz)   Height:        Intake/Output Summary (Last 24 hours) at 12/09/2017 1346 Last data filed at 12/09/2017 0452 Gross per 24 hour  Intake 120 ml  Output 800 ml  Net -680 ml   Filed Weights   12/06/17 1522 12/07/17 0000 12/09/17 0443  Weight: 78 kg (172 lb) 80.4 kg (177 lb 4 oz) 84.1 kg (185 lb 6.5 oz)    Examination:  Gen: Awake, Alert, Oriented X 3, no distress HEENT: PERRLA, Neck supple, + JVD Lungs: fine basilar crackles CVS: RRR,no Murmurs Abd: soft, Non tender, non distended, BS present Extremities: No Cyanosis, Clubbing or edema Skin: no new rashes Psychiatry: Judgement and insight appear normal. Mood & affect appropriate.     Data Reviewed:   CBC: Recent Labs  Lab 12/06/17 1524 12/07/17 0234 12/08/17 0703  WBC 11.4* 11.9* 13.8*  HGB 12.8* 10.9* 10.2*  HCT 40.5 34.8* 32.9*  MCV 93.8 93.3 93.7  PLT 178 160 102*   Basic Metabolic Panel: Recent Labs  Lab 12/06/17 1524 12/06/17 2049 12/07/17 0234 12/08/17 0703 12/09/17 0851  NA 142 142 136 135 137  K 3.3* 4.2 3.2* 3.9 3.6  CL 99* 106 102 104 108  CO2 21* 22 23 22  20*  GLUCOSE 125* 139* 126* 121* 121*  BUN 33* 33* 34* 43* 45*  CREATININE 2.57* 2.68* 2.77* 3.49* 3.27*  CALCIUM 8.2* 8.6* 8.1* 8.1* 8.0*    GFR: Estimated Creatinine Clearance: 17.4 mL/min (A) (by C-G formula based on SCr of 3.27 mg/dL (H)). Liver Function Tests: Recent Labs  Lab 12/06/17 2049 12/07/17 0234  AST 53* 30  ALT 16* 14*  ALKPHOS 57 50  BILITOT 0.6 0.8  PROT 6.6 5.9*  ALBUMIN 3.3* 3.0*   Recent Labs  Lab 12/06/17 2049  LIPASE 28   No results for input(s): AMMONIA in the last 168 hours. Coagulation Profile: Recent Labs  Lab 12/06/17 2130  INR 1.17   Cardiac Enzymes: Recent Labs  Lab 12/07/17 1305  CKTOTAL 338   BNP (last 3 results) No results for input(s): PROBNP in the last 8760 hours. HbA1C: No results for input(s): HGBA1C in the last 72 hours. CBG: Recent Labs  Lab 12/06/17 1531  GLUCAP 121*   Lipid Profile: No results for input(s): CHOL, HDL, LDLCALC, TRIG, CHOLHDL, LDLDIRECT in the last 72 hours. Thyroid Function Tests: Recent Labs    12/07/17 0054  TSH 2.885   Anemia Panel: No results for input(s): VITAMINB12, FOLATE, FERRITIN, TIBC, IRON, RETICCTPCT in the last 72 hours. Urine analysis:    Component Value Date/Time   COLORURINE YELLOW 12/06/2017 [place] 12/06/2017 1524   LABSPEC 1.010 12/06/2017 1524   PHURINE 5.0 12/06/2017 1524   GLUCOSEU NEGATIVE 12/06/2017 1524   HGBUR MODERATE (A) 12/06/2017 1524   BILIRUBINUR NEGATIVE 12/06/2017 1524   KETONESUR NEGATIVE 12/06/2017 1524   PROTEINUR NEGATIVE 12/06/2017 1524   UROBILINOGEN 0.2 08/15/2013 1132   NITRITE NEGATIVE 12/06/2017 1524   LEUKOCYTESUR NEGATIVE 12/06/2017 1524   Sepsis Labs: @LABRCNTIP (procalcitonin:4,lacticidven:4)  ) Recent Results (from the past 240 hour(s))  Culture, blood (routine x 2)     Status: None (Preliminary result)   Collection Time: 12/06/17  3:40 PM  Result Value Ref Range Status   Specimen Description BLOOD LEFT ANTECUBITAL  Final   Special Requests   Final    BOTTLES DRAWN AEROBIC AND ANAEROBIC Blood Culture adequate volume   Culture   Final    NO GROWTH 3  DAYS Performed at [facility], [place] [address], [place], [place] [address]    Report Status PENDING  Incomplete  Culture, blood (routine x 2)     Status: Abnormal   Collection Time: 12/06/17  4:35 PM  Result Value Ref Range Status   Specimen Description BLOOD RIGHT ANTECUBITAL  Final   Special Requests   Final    BOTTLES DRAWN AEROBIC AND ANAEROBIC Blood Culture adequate volume   Culture  Setup Time   Final    GRAM POSITIVE COCCI IN PAIRS IN CHAINS IN BOTH AEROBIC AND ANAEROBIC BOTTLES CRITICAL RESULT CALLED TO, READ BACK BY AND VERIFIED WITH: B MANCHERIL,PHARMD AT 1003 12/07/17 BY L BENFIELD Performed at [facility], [place] [address], [place], [place] [address]    Culture ENTEROCOCCUS FAECALIS (A)  Final   Report Status 12/09/2017 FINAL  Final   Organism ID, Bacteria ENTEROCOCCUS FAECALIS  Final      Susceptibility   Enterococcus faecalis - MIC*    AMPICILLIN <=2 SENSITIVE Sensitive     VANCOMYCIN 1 SENSITIVE Sensitive     GENTAMICIN SYNERGY SENSITIVE Sensitive     * ENTEROCOCCUS FAECALIS  Blood Culture ID Panel (Reflexed)     Status: Abnormal   Collection Time: 12/06/17  4:35 PM  Result Value Ref Range Status   Enterococcus species DETECTED (A) NOT DETECTED Final    Comment: CRITICAL RESULT CALLED TO, READ BACK BY AND VERIFIED WITH: B MANCHERIL,PHARMD AT 1003 12/07/17 BY L BENFIELD    Vancomycin resistance NOT DETECTED NOT DETECTED Final   Listeria monocytogenes NOT DETECTED NOT DETECTED Final   Staphylococcus species NOT DETECTED NOT DETECTED Final   Staphylococcus aureus NOT DETECTED NOT DETECTED Final   Streptococcus species NOT DETECTED NOT DETECTED Final   Streptococcus agalactiae NOT DETECTED NOT DETECTED Final   Streptococcus pneumoniae NOT DETECTED NOT DETECTED Final   Streptococcus pyogenes NOT DETECTED NOT DETECTED Final   Acinetobacter baumannii NOT DETECTED NOT DETECTED Final   Enterobacteriaceae species NOT DETECTED NOT DETECTED Final   Enterobacter  cloacae complex NOT DETECTED NOT DETECTED Final   Escherichia coli NOT DETECTED NOT DETECTED Final   Klebsiella oxytoca NOT DETECTED NOT DETECTED Final   Klebsiella pneumoniae NOT DETECTED NOT DETECTED Final   Proteus species NOT DETECTED NOT DETECTED Final   Serratia marcescens NOT DETECTED NOT DETECTED Final   Haemophilus influenzae NOT DETECTED NOT DETECTED Final   Neisseria meningitidis NOT DETECTED NOT DETECTED Final   Pseudomonas aeruginosa NOT DETECTED NOT DETECTED Final   Candida albicans NOT DETECTED NOT DETECTED Final   Candida glabrata NOT DETECTED NOT DETECTED Final   Candida krusei NOT DETECTED NOT DETECTED Final   Candida parapsilosis NOT DETECTED NOT DETECTED Final   Candida tropicalis NOT DETECTED NOT DETECTED Final    Comment: Performed at [facility], [address] [place], [place] [address]  Culture, blood (routine x 2)     Status: None (Preliminary result)   Collection Time: 12/08/17  7:20 AM  Result Value Ref Range Status   Specimen Description BLOOD RIGHT HAND  Final   Special Requests   Final    BOTTLES DRAWN AEROBIC AND ANAEROBIC Blood Culture adequate volume   Culture   Final    NO GROWTH 1 DAY Performed at [facility], [place] [address] [place], [place] [address]    Report Status PENDING  Incomplete  Culture, blood (routine x 2)     Status: None (Preliminary result)   Collection Time: 12/08/17  7:37 AM  Result Value Ref Range Status   Specimen Description BLOOD RIGHT ANTECUBITAL  Final   Special Requests   Final    BOTTLES DRAWN AEROBIC AND ANAEROBIC Blood Culture results may not be optimal due to an excessive volume of blood received in culture bottles   Culture   Final    NO GROWTH 1 DAY Performed at [facility], [place] [address], [place], [place] [address]    Report Status PENDING  Incomplete         Radiology Studies: No results found.      Scheduled Meds: . amiodarone  200 mg Oral BID  . apixaban  2.5 mg Oral BID    . carvedilol  3.125 mg Oral BID WC  . ezetimibe  10 mg Oral  Daily  . ferrous sulfate  325 mg Oral Q supper  . levothyroxine  25 mcg Oral Once per day on Mon Wed Fri  . levothyroxine  50 mcg Oral Once per day on Sun Tue Thu Sat  . multivitamin with minerals  1 tablet Oral QHS  . pantoprazole  40 mg Oral Daily  . rosuvastatin  10 mg Oral QHS  . sodium chloride flush  3 mL Intravenous Q12H  . tamsulosin  0.4 mg Oral QHS   Continuous Infusions: . sodium chloride    . DAPTOmycin (CUBICIN)  IV Stopped (12/07/17 2017)     LOS: 3 days    Time spent: 58min    Domenic Polite, MD [facility] Page via [email or web address], password [email or web address] After 7PM please contact night-coverage  12/09/2017, 1:46 PM

## 2017-12-09 NOTE — H&P (View-Only) (Signed)
PROGRESS NOTE    [name]  GGY:[id number] DOB: 03/19/1932 DOA: 12/06/2017 PCP: [name], MD  Brief Narrative: [name] with Hypothyroidism, Hypertension, CAD, P.Afib, Severe Cardiomyopathy EF 25%, COPD, OSA presented to ED with nausea, vomiting and fever CT unremarkable, UA with 6-30WBC and rare bacteria. FOund to have Enterococcal bacteremia and AKI on CKD4  Assessment & Plan:   Sepsis/Enterococcus fecalis bacteremia -likely urinary source, urine not highly suggestive of infection though,  unfortunately Urine Cx was not sent on admission and too late now -ID following, continue IV Daptomycin -Day 3 -repeat Blood CX 2/25 pending -2D ECHO with no vegetations, called Cards to request TEE-high referral volume will likely not be tomorrow -stop IVF -CT abd pelvis unremarkable  Chronic systolic CHF/ischemic cardiomyopathy -Status post AICD, Echo with EF of 20%  -Continue aspirin, Coreg -stop IVF today -resume diuretics tomorrow  Paroxysmal atrial fibrillation -chads2vasc score>3, continue Coreg, amiodarone and low dose apixaban  AKI on CKD4 -Followed by Dr. Pearson Grippe with [facility] -stable, Baseline creatinine is 2.5-3 range -creatinine bumped to 3.4 -could be due to Vanc and Sepsis, abx changed to Daptomycin,  -creatinine 3.2 now which is close to his baseline, stop IVF , monitor -He would be a very poor dialysis candidate when he gets to that stage  Hypertension -continue Coreg , hold diuretics today  COPD -Stable, not on home oxygen -continue nebs PRN  DVT prophylaxis: Eliquis Code Status: Full Code Family Communication: None at bedside Disposition Plan: Home pending improvement  Consultants:      Procedures:   Antimicrobials:  Antibiotics Given (last 72 hours)    Date/Time Action Medication Dose Rate   12/07/17 0219 New Bag/Given   ciprofloxacin (CIPRO) IVPB 200 mg 200 mg 100 mL/hr   12/07/17 1042 New Bag/Given   cefTRIAXone  (ROCEPHIN) 1 g in sodium chloride 0.9 % 100 mL IVPB 1 g 200 mL/hr   12/07/17 1120 New Bag/Given   vancomycin (VANCOCIN) 1,500 mg in sodium chloride 0.9 % 500 mL IVPB 1,500 mg 250 mL/hr   12/07/17 1938 New Bag/Given  [On med cart to be delivered]   DAPTOmycin (CUBICIN) 650 mg in sodium chloride 0.9 % IVPB 650 mg 226 mL/hr      Subjective: -feels better, wondering when he can go home Objective: Vitals:   12/08/17 2045 12/09/17 0031 12/09/17 0443 12/09/17 0845  BP: (!) 93/43 (!) 87/56 101/63   Pulse: 64 97 86   Resp: (!) 24 (!) 22 (!) 26   Temp: 99.4 F (37.4 C) 99.3 F (37.4 C) 99.3 F (37.4 C)   TempSrc: Oral Oral Oral   SpO2: 96% 95% 93% 95%  Weight:   84.1 kg (185 lb 6.5 oz)   Height:        Intake/Output Summary (Last 24 hours) at 12/09/2017 1346 Last data filed at 12/09/2017 0452 Gross per 24 hour  Intake 120 ml  Output 800 ml  Net -680 ml   Filed Weights   12/06/17 1522 12/07/17 0000 12/09/17 0443  Weight: 78 kg (172 lb) 80.4 kg (177 lb 4 oz) 84.1 kg (185 lb 6.5 oz)    Examination:  Gen: Awake, Alert, Oriented X 3, no distress HEENT: PERRLA, Neck supple, + JVD Lungs: fine basilar crackles CVS: RRR,no Murmurs Abd: soft, Non tender, non distended, BS present Extremities: No Cyanosis, Clubbing or edema Skin: no new rashes Psychiatry: Judgement and insight appear normal. Mood & affect appropriate.     Data Reviewed:   CBC: Recent Labs  Lab 12/06/17 1524 12/07/17 0234 12/08/17 0703  WBC 11.4* 11.9* 13.8*  HGB 12.8* 10.9* 10.2*  HCT 40.5 34.8* 32.9*  MCV 93.8 93.3 93.7  PLT 178 160 400*   Basic Metabolic Panel: Recent Labs  Lab 12/06/17 1524 12/06/17 2049 12/07/17 0234 12/08/17 0703 12/09/17 0851  NA 142 142 136 135 137  K 3.3* 4.2 3.2* 3.9 3.6  CL 99* 106 102 104 108  CO2 21* 22 23 22  20*  GLUCOSE 125* 139* 126* 121* 121*  BUN 33* 33* 34* 43* 45*  CREATININE 2.57* 2.68* 2.77* 3.49* 3.27*  CALCIUM 8.2* 8.6* 8.1* 8.1* 8.0*    GFR: Estimated Creatinine Clearance: 17.4 mL/min (A) (by C-G formula based on SCr of 3.27 mg/dL (H)). Liver Function Tests: Recent Labs  Lab 12/06/17 2049 12/07/17 0234  AST 53* 30  ALT 16* 14*  ALKPHOS 57 50  BILITOT 0.6 0.8  PROT 6.6 5.9*  ALBUMIN 3.3* 3.0*   Recent Labs  Lab 12/06/17 2049  LIPASE 28   No results for input(s): AMMONIA in the last 168 hours. Coagulation Profile: Recent Labs  Lab 12/06/17 2130  INR 1.17   Cardiac Enzymes: Recent Labs  Lab 12/07/17 1305  CKTOTAL 338   BNP (last 3 results) No results for input(s): PROBNP in the last 8760 hours. HbA1C: No results for input(s): HGBA1C in the last 72 hours. CBG: Recent Labs  Lab 12/06/17 1531  GLUCAP 121*   Lipid Profile: No results for input(s): CHOL, HDL, LDLCALC, TRIG, CHOLHDL, LDLDIRECT in the last 72 hours. Thyroid Function Tests: Recent Labs    12/07/17 0054  TSH 2.885   Anemia Panel: No results for input(s): VITAMINB12, FOLATE, FERRITIN, TIBC, IRON, RETICCTPCT in the last 72 hours. Urine analysis:    Component Value Date/Time   COLORURINE YELLOW 12/06/2017 [place] 12/06/2017 1524   LABSPEC 1.010 12/06/2017 1524   PHURINE 5.0 12/06/2017 1524   GLUCOSEU NEGATIVE 12/06/2017 1524   HGBUR MODERATE (A) 12/06/2017 1524   BILIRUBINUR NEGATIVE 12/06/2017 1524   KETONESUR NEGATIVE 12/06/2017 1524   PROTEINUR NEGATIVE 12/06/2017 1524   UROBILINOGEN 0.2 08/15/2013 1132   NITRITE NEGATIVE 12/06/2017 1524   LEUKOCYTESUR NEGATIVE 12/06/2017 1524   Sepsis Labs: @LABRCNTIP (procalcitonin:4,lacticidven:4)  ) Recent Results (from the past 240 hour(s))  Culture, blood (routine x 2)     Status: None (Preliminary result)   Collection Time: 12/06/17  3:40 PM  Result Value Ref Range Status   Specimen Description BLOOD LEFT ANTECUBITAL  Final   Special Requests   Final    BOTTLES DRAWN AEROBIC AND ANAEROBIC Blood Culture adequate volume   Culture   Final    NO GROWTH 3  DAYS Performed at [facility], [place] [address], [place], [place] [address]    Report Status PENDING  Incomplete  Culture, blood (routine x 2)     Status: Abnormal   Collection Time: 12/06/17  4:35 PM  Result Value Ref Range Status   Specimen Description BLOOD RIGHT ANTECUBITAL  Final   Special Requests   Final    BOTTLES DRAWN AEROBIC AND ANAEROBIC Blood Culture adequate volume   Culture  Setup Time   Final    GRAM POSITIVE COCCI IN PAIRS IN CHAINS IN BOTH AEROBIC AND ANAEROBIC BOTTLES CRITICAL RESULT CALLED TO, READ BACK BY AND VERIFIED WITH: B MANCHERIL,PHARMD AT 1003 12/07/17 BY L BENFIELD Performed at [facility], [place] [address] [place], [place] [address]    Culture ENTEROCOCCUS FAECALIS (A)  Final   Report Status 12/09/2017 FINAL  Final   Organism ID, Bacteria ENTEROCOCCUS FAECALIS  Final      Susceptibility   Enterococcus faecalis - MIC*    AMPICILLIN <=2 SENSITIVE Sensitive     VANCOMYCIN 1 SENSITIVE Sensitive     GENTAMICIN SYNERGY SENSITIVE Sensitive     * ENTEROCOCCUS FAECALIS  Blood Culture ID Panel (Reflexed)     Status: Abnormal   Collection Time: 12/06/17  4:35 PM  Result Value Ref Range Status   Enterococcus species DETECTED (A) NOT DETECTED Final    Comment: CRITICAL RESULT CALLED TO, READ BACK BY AND VERIFIED WITH: B MANCHERIL,PHARMD AT 1003 12/07/17 BY L BENFIELD    Vancomycin resistance NOT DETECTED NOT DETECTED Final   Listeria monocytogenes NOT DETECTED NOT DETECTED Final   Staphylococcus species NOT DETECTED NOT DETECTED Final   Staphylococcus aureus NOT DETECTED NOT DETECTED Final   Streptococcus species NOT DETECTED NOT DETECTED Final   Streptococcus agalactiae NOT DETECTED NOT DETECTED Final   Streptococcus pneumoniae NOT DETECTED NOT DETECTED Final   Streptococcus pyogenes NOT DETECTED NOT DETECTED Final   Acinetobacter baumannii NOT DETECTED NOT DETECTED Final   Enterobacteriaceae species NOT DETECTED NOT DETECTED Final   Enterobacter  cloacae complex NOT DETECTED NOT DETECTED Final   Escherichia coli NOT DETECTED NOT DETECTED Final   Klebsiella oxytoca NOT DETECTED NOT DETECTED Final   Klebsiella pneumoniae NOT DETECTED NOT DETECTED Final   Proteus species NOT DETECTED NOT DETECTED Final   Serratia marcescens NOT DETECTED NOT DETECTED Final   Haemophilus influenzae NOT DETECTED NOT DETECTED Final   Neisseria meningitidis NOT DETECTED NOT DETECTED Final   Pseudomonas aeruginosa NOT DETECTED NOT DETECTED Final   Candida albicans NOT DETECTED NOT DETECTED Final   Candida glabrata NOT DETECTED NOT DETECTED Final   Candida krusei NOT DETECTED NOT DETECTED Final   Candida parapsilosis NOT DETECTED NOT DETECTED Final   Candida tropicalis NOT DETECTED NOT DETECTED Final    Comment: Performed at [facility], [address], [place], [name] [address]  Culture, blood (routine x 2)     Status: None (Preliminary result)   Collection Time: 12/08/17  7:20 AM  Result Value Ref Range Status   Specimen Description BLOOD RIGHT HAND  Final   Special Requests   Final    BOTTLES DRAWN AEROBIC AND ANAEROBIC Blood Culture adequate volume   Culture   Final    NO GROWTH 1 DAY Performed at [facility], [place] [address], [place], [place] [address]    Report Status PENDING  Incomplete  Culture, blood (routine x 2)     Status: None (Preliminary result)   Collection Time: 12/08/17  7:37 AM  Result Value Ref Range Status   Specimen Description BLOOD RIGHT ANTECUBITAL  Final   Special Requests   Final    BOTTLES DRAWN AEROBIC AND ANAEROBIC Blood Culture results may not be optimal due to an excessive volume of blood received in culture bottles   Culture   Final    NO GROWTH 1 DAY Performed at [facility], [place] [address], [place], [place] [address]    Report Status PENDING  Incomplete         Radiology Studies: No results found.      Scheduled Meds: . amiodarone  200 mg Oral BID  . apixaban  2.5 mg Oral BID    . carvedilol  3.125 mg Oral BID WC  . ezetimibe  10 mg Oral  Daily  . ferrous sulfate  325 mg Oral Q supper  . levothyroxine  25 mcg Oral Once per day on Mon Wed Fri  . levothyroxine  50 mcg Oral Once per day on Sun Tue Thu Sat  . multivitamin with minerals  1 tablet Oral QHS  . pantoprazole  40 mg Oral Daily  . rosuvastatin  10 mg Oral QHS  . sodium chloride flush  3 mL Intravenous Q12H  . tamsulosin  0.4 mg Oral QHS   Continuous Infusions: . sodium chloride    . DAPTOmycin (CUBICIN)  IV Stopped (12/07/17 2017)     LOS: 3 days    Time spent: 74min    Domenic Polite, MD [facility] Page via [email or web address], password [email or web address] After 7PM please contact night-coverage  12/09/2017, 1:46 PM

## 2017-12-09 NOTE — Progress Notes (Signed)
    [facility] has been requested to perform a transesophageal echocardiogram on 12/09/2017 for bactremia.  After careful review of history and examination, the risks and benefits of transesophageal echocardiogram have been explained including risks of esophageal damage, perforation (1:10,000 risk), bleeding, pharyngeal hematoma as well as other potential complications associated with conscious sedation including aspiration, arrhythmia, respiratory failure and death. Alternatives to treatment were discussed, questions were answered. Patient is willing to proceed.   84 yo male with PMH of ICM EF 20% s/p ICD, CKD stage IV, PAF, HTN and COPD presented with bacteremia. TTE showed no evidence of vegetation. BP soft, last BP 100.    Almyra Deforest, [place] 12/09/2017 2:47 PM

## 2017-12-09 NOTE — Progress Notes (Signed)
Pharmacist Heart Failure Core Measure Documentation  Assessment: [name] has an EF documented as 20-25% on 12/08/2017 by Echo.  Rationale: Heart failure patients with left ventricular systolic dysfunction (LVSD) and an EF < 40% should be prescribed an angiotensin converting enzyme inhibitor (ACEI) or angiotensin receptor blocker (ARB) at discharge unless a contraindication is documented in the medical record.  This patient is not currently on an ACEI or ARB for HF.  This note is being placed in the record in order to provide documentation that a contraindication to the use of these agents is present for this encounter.  ACE Inhibitor or Angiotensin Receptor Blocker is contraindicated (specify all that apply)  []   ACEI allergy AND ARB allergy []   Angioedema []   Moderate or severe aortic stenosis []   Hyperkalemia []   Hypotension []   Renal artery stenosis [x]   Worsening renal function, preexisting renal disease or dysfunction   Knowledge Escandon S. Alford Highland, PharmD, BCPS Clinical Staff Pharmacist Pager [phone]  Eilene Ghazi [facility] 12/09/2017 3:11 PM

## 2017-12-10 ENCOUNTER — Encounter (HOSPITAL_COMMUNITY): Payer: Medicare Other | Admitting: Internal Medicine

## 2017-12-10 ENCOUNTER — Other Ambulatory Visit (HOSPITAL_COMMUNITY): Payer: Self-pay | Admitting: *Deleted

## 2017-12-10 ENCOUNTER — Encounter (HOSPITAL_COMMUNITY): Payer: Self-pay | Admitting: *Deleted

## 2017-12-10 ENCOUNTER — Inpatient Hospital Stay (HOSPITAL_COMMUNITY): Payer: Medicare Other | Admitting: Certified Registered Nurse Anesthetist

## 2017-12-10 ENCOUNTER — Inpatient Hospital Stay: Payer: Self-pay

## 2017-12-10 ENCOUNTER — Inpatient Hospital Stay (HOSPITAL_COMMUNITY): Payer: Medicare Other

## 2017-12-10 ENCOUNTER — Encounter (HOSPITAL_COMMUNITY)
Admission: EM | Disposition: A | Discharge status: Home Health | Payer: Self-pay | Source: Home / Self Care | Attending: Internal Medicine

## 2017-12-10 DIAGNOSIS — Z95810 Presence of automatic (implantable) cardiac defibrillator: Secondary | ICD-10-CM

## 2017-12-10 DIAGNOSIS — I340 Nonrheumatic mitral (valve) insufficiency: Secondary | ICD-10-CM

## 2017-12-10 DIAGNOSIS — I5022 Chronic systolic (congestive) heart failure: Secondary | ICD-10-CM

## 2017-12-10 DIAGNOSIS — R7881 Bacteremia: Secondary | ICD-10-CM

## 2017-12-10 DIAGNOSIS — K449 Diaphragmatic hernia without obstruction or gangrene: Secondary | ICD-10-CM

## 2017-12-10 DIAGNOSIS — Z4502 Encounter for adjustment and management of automatic implantable cardiac defibrillator: Secondary | ICD-10-CM

## 2017-12-10 DIAGNOSIS — I255 Ischemic cardiomyopathy: Secondary | ICD-10-CM

## 2017-12-10 DIAGNOSIS — I4891 Unspecified atrial fibrillation: Secondary | ICD-10-CM

## 2017-12-10 HISTORY — PX: TEE WITHOUT CARDIOVERSION: SHX5443

## 2017-12-10 LAB — CBC
HCT: 30.1 % — ABNORMAL LOW (ref 39.0–52.0)
Hemoglobin: 9.6 g/dL — ABNORMAL LOW (ref 13.0–17.0)
MCH: 29.9 pg (ref 26.0–34.0)
MCHC: 31.9 g/dL (ref 30.0–36.0)
MCV: 93.8 fL (ref 78.0–100.0)
PLATELETS: 127 10*3/uL — AB (ref 150–400)
RBC: 3.21 MIL/uL — ABNORMAL LOW (ref 4.22–5.81)
RDW: 15.1 % (ref 11.5–15.5)
WBC: 6.7 10*3/uL (ref 4.0–10.5)

## 2017-12-10 LAB — BASIC METABOLIC PANEL
Anion gap: 12 (ref 5–15)
BUN: 49 mg/dL — AB (ref 6–20)
CALCIUM: 8.1 mg/dL — AB (ref 8.9–10.3)
CO2: 18 mmol/L — ABNORMAL LOW (ref 22–32)
Chloride: 106 mmol/L (ref 101–111)
Creatinine, Ser: 3.23 mg/dL — ABNORMAL HIGH (ref 0.61–1.24)
GFR calc Af Amer: 19 mL/min — ABNORMAL LOW (ref >60)
GFR, EST NON AFRICAN AMERICAN: 16 mL/min — AB (ref >60)
Glucose, Bld: 110 mg/dL — ABNORMAL HIGH (ref 65–99)
POTASSIUM: 3.8 mmol/L (ref 3.5–5.1)
SODIUM: 136 mmol/L (ref 135–145)

## 2017-12-10 LAB — CK: CK TOTAL: 161 U/L (ref 49–397)

## 2017-12-10 LAB — MAGNESIUM: Magnesium: 2.8 mg/dL — ABNORMAL HIGH (ref 1.7–2.4)

## 2017-12-10 LAB — PHOSPHORUS: PHOSPHORUS: 2.9 mg/dL (ref 2.5–4.6)

## 2017-12-10 SURGERY — ECHOCARDIOGRAM, TRANSESOPHAGEAL
Anesthesia: Monitor Anesthesia Care

## 2017-12-10 MED ORDER — BUTAMBEN-TETRACAINE-BENZOCAINE 2-2-14 % EX AERO
INHALATION_SPRAY | CUTANEOUS | Status: DC | PRN
  Administered 2017-12-10: 2 via TOPICAL

## 2017-12-10 MED ORDER — FUROSEMIDE 40 MG PO TABS
40.0000 mg | ORAL_TABLET | Freq: Every day | ORAL | Status: DC
  Administered 2017-12-10 – 2017-12-12 (×3): 40 mg via ORAL
  Filled 2017-12-10: qty 2
  Filled 2017-12-10 (×2): qty 1

## 2017-12-10 MED ORDER — PROPOFOL 10 MG/ML IV BOLUS
INTRAVENOUS | Status: DC | PRN
  Administered 2017-12-10: 10 mg via INTRAVENOUS
  Administered 2017-12-10: 20 mg via INTRAVENOUS
  Administered 2017-12-10: 10 mg via INTRAVENOUS

## 2017-12-10 MED ORDER — PHENYLEPHRINE 40 MCG/ML (10ML) SYRINGE FOR IV PUSH (FOR BLOOD PRESSURE SUPPORT)
PREFILLED_SYRINGE | INTRAVENOUS | Status: DC | PRN
  Administered 2017-12-10 (×2): 80 ug via INTRAVENOUS

## 2017-12-10 MED ORDER — FUROSEMIDE 20 MG PO TABS: 40.0000 mg | ORAL_TABLET | Freq: Every day | ORAL | 2 refills | Status: DC

## 2017-12-10 MED ORDER — SODIUM CHLORIDE 0.9% FLUSH: 10.0000 mL | INTRAVENOUS | Status: DC | PRN

## 2017-12-10 MED ORDER — SODIUM CHLORIDE 0.9% FLUSH
10.0000 mL | Freq: Two times a day (BID) | INTRAVENOUS | Status: DC
  Administered 2017-12-11 (×2): 10 mL

## 2017-12-10 MED ORDER — PROPOFOL 500 MG/50ML IV EMUL
INTRAVENOUS | Status: DC | PRN
  Administered 2017-12-10: 75 ug/kg/min via INTRAVENOUS

## 2017-12-10 MED ORDER — POTASSIUM CHLORIDE CRYS ER 10 MEQ PO TBCR
10.0000 meq | EXTENDED_RELEASE_TABLET | Freq: Every day | ORAL | Status: DC
  Administered 2017-12-10 – 2017-12-12 (×3): 10 meq via ORAL
  Filled 2017-12-10 (×3): qty 1

## 2017-12-10 NOTE — Progress Notes (Signed)
PHARMACY CONSULT NOTE FOR:  OUTPATIENT  PARENTERAL ANTIBIOTIC THERAPY (OPAT)  Indication: Enterococcal bacteremia Regimen: daptomycin 650 mg IV Q 48 hrs End date: 12/21/2017  IV antibiotic discharge orders are pended. To discharging provider:  please sign these orders via discharge navigator,  Select New Orders & click on the button choice - Manage This Unsigned Work.     Thank you for allowing pharmacy to be a part of this patient's care.  Maryanna Shape, PharmD, BCPS  Clinical Pharmacist  Pager: [phone]  12/10/2017, 7:24 PM

## 2017-12-10 NOTE — Interval H&P Note (Signed)
History and Physical Interval Note:  12/10/2017 10:24 AM  [name]  has presented today for surgery, with the diagnosis of BACTEREMIA  The various methods of treatment have been discussed with the patient and family. After consideration of risks, benefits and other options for treatment, the patient has consented to  Procedure(s): TRANSESOPHAGEAL ECHOCARDIOGRAM (TEE) (N/A) as a surgical intervention .  The patient's history has been reviewed, patient examined, no change in status, stable for surgery.  I have reviewed the patient's chart and labs.  Questions were answered to the patient's satisfaction.  8 beats NSVT not unusual with underlying cardiomyopathy. ICD in place. Bb noted. Electrolytes OK. OK to proceed.    [employer]

## 2017-12-10 NOTE — Anesthesia Postprocedure Evaluation (Signed)
Anesthesia Post Note  Patient: [name]  Procedure(s) Performed: TRANSESOPHAGEAL ECHOCARDIOGRAM (TEE) (N/A )     Patient location during evaluation: PACU Anesthesia Type: MAC Level of consciousness: awake and alert Pain management: pain level controlled Vital Signs Assessment: post-procedure vital signs reviewed and stable Respiratory status: spontaneous breathing, nonlabored ventilation, respiratory function stable and patient connected to nasal cannula oxygen Cardiovascular status: stable and blood pressure returned to baseline Postop Assessment: no apparent nausea or vomiting Anesthetic complications: no    Last Vitals:  Vitals:   12/10/17 1230 12/10/17 1250  BP:  95/61  Pulse:  (!) 102  Resp: (!) 26 (!) 26  Temp:  36.9 C  SpO2:  94%    Last Pain:  Vitals:   12/10/17 1250  TempSrc: Oral  PainSc:                  [name]

## 2017-12-10 NOTE — Progress Notes (Signed)
ANTIBIOTIC CONSULT NOTE   Pharmacy Consult for Daptomycin + Eliquis Indication: Enterococcal bacteremia.  + afib  Allergies  Allergen Reactions  . Ramipril Other (See Comments)    cough  . Penicillins Itching and Rash    Has patient had a PCN reaction causing immediate rash, facial/tongue/throat swelling, SOB or lightheadedness with hypotension: No Has patient had a PCN reaction causing severe rash involving mucus membranes or skin necrosis: Yes-back and hands. Has patient had a PCN reaction that required hospitalization: No Has patient had a PCN reaction occurring within the last 10 years: Yes If all of the above answers are "NO", then may proceed with Cephalosporin use.    Patient Measurements: Height: 5\' 10"  (177.8 cm) Weight: 186 lb 11.2 oz (84.7 kg) IBW/kg (Calculated) : 73 Adjusted Body Weight:   Vital Signs: Temp: 98.4 F (36.9 C) (02/27 1250) Temp Source: Oral (02/27 1250) BP: 95/61 (02/27 1250) Pulse Rate: 102 (02/27 1250) Intake/Output from previous day: 02/26 0701 - 02/27 0700 In: 482 [P.O.:240; I.V.:129; IV Piggyback:113] Out: 1100 [Urine:1100] Intake/Output from this shift: Total I/O In: 360 [I.V.:360] Out: 500 [Urine:500]  Labs: Recent Labs    12/08/17 0703 12/09/17 0851 12/10/17 0230  WBC 13.8*  --  6.7  HGB 10.2*  --  9.6*  PLT 130*  --  127*  CREATININE 3.49* 3.27* 3.23*   Estimated Creatinine Clearance: 17.6 mL/min (A) (by C-G formula based on SCr of 3.23 mg/dL (H)). No results for input(s): VANCOTROUGH, VANCOPEAK, VANCORANDOM, GENTTROUGH, GENTPEAK, GENTRANDOM, TOBRATROUGH, TOBRAPEAK, TOBRARND, AMIKACINPEAK, AMIKACINTROU, AMIKACIN in the last 72 hours.   Microbiology:   Medical History: Past Medical History:  Diagnosis Date  . Acute bronchitis with COPD ([place]) 09/10/2016  . Anemia   . Atrial fibrillation or flutter    maintaining sinus on amiodarone    . Bladder cancer ([place]) dx'd 05/2012  . BPH (benign prostatic hyperplasia)   . CAD  (coronary artery disease)     a. s/p anterior MI 12/05 c/b shock -> stent LAD   b. s/p stenting OM-1, 2/06  . CHF (congestive heart failure) (H[facility])    due to ischemic CM  a. EF 20-30%. (Nov 2008)   b. s/p [employer] BiV-ICD    c. CPX 07/2008  pvo2 16.3 (63% predicted) slope 34 RER 1.08 O2 pulse 93%  . Cough    occasional /productive, no fever/ not new  . CRI (chronic renal insufficiency)    (baseline 2.0-2.2)/ recent hospitalization 8/13  . GERD (gastroesophageal reflux disease)    hx Barretts esophagitis  . History of blood transfusion    following coumadin  usage  . HTN (hypertension)    EKG 05/14/12,Chest x ray 8/13, last ICD interrogation 8/13 EPIC  . Hyperlipidemia   . Hypothyroidism   . 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 Skin cancer    basal cells   facial x 4, 1 right forearm  . Sleep apnea    STOP BANG SCORE 4    Assessment:  Anticoag: Eliquis 2.5mg  BID for afib. Hgb 10.9>10.2>9.6  ID: Dapto D4  for Enterococcal bacteremia. Plans for ID consult- change to dapto with renal func and concern on PCN allergy. No vegetation on Echo. TEE without vegation. -WBC= 11.9>13.8>6.7, tmax= 100.1, currently afeb, LA= 3.3 - 2/24 CK 338 baseline  2/24 dapto>> (2 wks starting 2/25 to end 3/7) 2/24 vanc>> 2/24  2/24 rocephin> 2/24 2/23 cipro>> 2/24   2/23 blood x2- Enterococcus Faecalis x 1(BCID w/ enterococcus)  Goal of Therapy:  Eradication of infection Therapeutic oral anticoagulation  Plan:  Dapto 650mg  IV q48hr (8mg /kg per ID) for CrCl<30, through 12/18/17 CK Q Sunday Eliquis 2.5mg  PO BID.   [name], PharmD, BCPS Clinical Staff Pharmacist Pager [phone]  [name] 12/10/2017,2:30 PM

## 2017-12-10 NOTE — Transfer of Care (Signed)
Immediate Anesthesia Transfer of Care Note  Patient: [name]  Procedure(s) Performed: TRANSESOPHAGEAL ECHOCARDIOGRAM (TEE) (N/A )  Patient Location: Endoscopy Unit  Anesthesia Type:MAC  Level of Consciousness: drowsy and patient cooperative  Airway & Oxygen Therapy: Patient Spontanous Breathing and Patient connected to nasal cannula oxygen  Post-op Assessment: Report given to RN, Post -op Vital signs reviewed and stable and Patient moving all extremities X 4  Post vital signs: Reviewed and stable  Last Vitals:  Vitals:   12/10/17 1020 12/10/17 1120  BP: (!) 119/49 (!) 83/52  Pulse: 91 82  Resp: 20 (!) 21  Temp: 36.9 C   SpO2: 92% 95%    Last Pain:  Vitals:   12/10/17 1020  TempSrc: Oral  PainSc:          Complications: No apparent anesthesia complications

## 2017-12-10 NOTE — Progress Notes (Signed)
  Echocardiogram 2D Echocardiogram has been performed.  [name] 12/10/2017, 11:23 AM

## 2017-12-10 NOTE — Progress Notes (Signed)
PROGRESS NOTE    KRAIG GENIS  DJS:[id number] DOB: May 27, 1932 DOA: 12/06/2017 PCP: [name], MD  Brief Narrative: [name] with Hypothyroidism, Hypertension, CAD, P.Afib, Severe Cardiomyopathy EF 25%, COPD, OSA presented to ED with nausea, vomiting and fever CT unremarkable, UA with 6-30WBC and rare bacteria. FOund to have Enterococcal bacteremia and AKI on CKD4  Assessment & Plan:   Sepsis/Enterococcus fecalis bacteremia -likely urinary source, urine not highly suggestive of infection though,  unfortunately Urine Cx was not sent on admission and too late now -ID following, continue IV Daptomycin -Day 4 -repeat Blood CX 2/25 pending -2D ECHO with no vegetations, called Cards to request TEE-high referral volume will likely not be tomorrow -stop IVF -CT abd pelvis unremarkable  ID seen today and patient is s/p TEE. They have recommended the following:  Patient remains afebrile. Underwent TEE this morning that did not see any vegetation. I have seen and examined the patient. Agree with plan listed below:  Enterococcal bacteremia thought to be due to UTI  - plan for  2 wks of daptomycin at 8mg /kg, renally dosed - use 2/25 as day 1. - can place picc line today  As such they have signed off after above recommendations  Chronic systolic CHF/ischemic cardiomyopathy -Status post AICD, Echo with EF of 20%  -Continue aspirin, Coreg -stop IVF today -resume diuretics today: lasix 40 mg po daily  Paroxysmal atrial fibrillation -chads2vasc score>3, continue Coreg, amiodarone and low dose apixaban  AKI on CKD4 -Followed by Dr. Pearson Grippe with [facility] -stable, Baseline creatinine is 2.5-3 range -creatinine bumped to 3.4 -could be due to Vanc and Sepsis, abx changed to Daptomycin,  -creatinine 3.2 now which is close to his baseline, stop IVF , monitor -I agree that he would be a very poor dialysis candidate when he gets to that  stage  Hypertension -continue Coreg   COPD -Stable, not on home oxygen -continue nebs PRN  DVT prophylaxis: Eliquis Code Status: Full Code Family Communication: None at bedside Disposition Plan: Home pending improvement  Consultants:      Procedures:   Antimicrobials:  Antibiotics Given (last 72 hours)    Date/Time Action Medication Dose Rate   12/07/17 1938 New Bag/Given  [On med cart to be delivered]   DAPTOmycin (CUBICIN) 650 mg in sodium chloride 0.9 % IVPB 650 mg 226 mL/hr   12/09/17 2132 New Bag/Given   DAPTOmycin (CUBICIN) 650 mg in sodium chloride 0.9 % IVPB 650 mg 226 mL/hr      Subjective: Pt has no new complaints. No acute issues overnight.  Objective: Vitals:   12/10/17 1205 12/10/17 1210 12/10/17 1230 12/10/17 1250  BP: (!) 111/55   95/61  Pulse: 93 83  (!) 102  Resp: (!) 29 (!) 27 (!) 26 (!) 26  Temp: 98.4 F (36.9 C)   98.4 F (36.9 C)  TempSrc: Oral   Oral  SpO2: 92% 93%  94%  Weight:      Height:        Intake/Output Summary (Last 24 hours) at 12/10/2017 1625 Last data filed at 12/10/2017 1200 Gross per 24 hour  Intake 842 ml  Output 1600 ml  Net -758 ml   Filed Weights   12/09/17 0443 12/10/17 0240 12/10/17 1020  Weight: 84.1 kg (185 lb 6.5 oz) 84.7 kg (186 lb 11.2 oz) 84.7 kg (186 lb 11.2 oz)    Examination:  Gen: Awake, Alert, Oriented X 3, no distress HEENT: PERRLA, Neck supple, + JVD Lungs: fine basilar  crackles CVS: RRR,no Murmurs Abd: soft, Non tender, non distended, BS present Extremities: No Cyanosis, Clubbing or edema Skin: no new rashes, on limited exam. Psychiatry: Mood & affect appropriate.     Data Reviewed:   CBC: Recent Labs  Lab 12/06/17 1524 12/07/17 0234 12/08/17 0703 12/10/17 0230  WBC 11.4* 11.9* 13.8* 6.7  HGB 12.8* 10.9* 10.2* 9.6*  HCT 40.5 34.8* 32.9* 30.1*  MCV 93.8 93.3 93.7 93.8  PLT 178 160 130* 867*   Basic Metabolic Panel: Recent Labs  Lab 12/06/17 2049 12/07/17 0234  12/08/17 0703 12/09/17 0851 12/10/17 0230 12/10/17 1440  NA 142 136 135 137 136  --   K 4.2 3.2* 3.9 3.6 3.8  --   CL 106 102 104 108 106  --   CO2 22 23 22  20* 18*  --   GLUCOSE 139* 126* 121* 121* 110*  --   BUN 33* 34* 43* 45* 49*  --   CREATININE 2.68* 2.77* 3.49* 3.27* 3.23*  --   CALCIUM 8.6* 8.1* 8.1* 8.0* 8.1*  --   MG  --   --   --   --   --  2.8*  PHOS  --   --   --   --   --  2.9   GFR: Estimated Creatinine Clearance: 17.6 mL/min (A) (by C-G formula based on SCr of 3.23 mg/dL (H)). Liver Function Tests: Recent Labs  Lab 12/06/17 2049 12/07/17 0234  AST 53* 30  ALT 16* 14*  ALKPHOS 57 50  BILITOT 0.6 0.8  PROT 6.6 5.9*  ALBUMIN 3.3* 3.0*   Recent Labs  Lab 12/06/17 2049  LIPASE 28   No results for input(s): AMMONIA in the last 168 hours. Coagulation Profile: Recent Labs  Lab 12/06/17 2130  INR 1.17   Cardiac Enzymes: Recent Labs  Lab 12/07/17 1305 12/10/17 0230  CKTOTAL 338 161   BNP (last 3 results) No results for input(s): PROBNP in the last 8760 hours. HbA1C: No results for input(s): HGBA1C in the last 72 hours. CBG: Recent Labs  Lab 12/06/17 1531  GLUCAP 121*   Lipid Profile: No results for input(s): CHOL, HDL, LDLCALC, TRIG, CHOLHDL, LDLDIRECT in the last 72 hours. Thyroid Function Tests: No results for input(s): TSH, T4TOTAL, FREET4, T3FREE, THYROIDAB in the last 72 hours. Anemia Panel: No results for input(s): VITAMINB12, FOLATE, FERRITIN, TIBC, IRON, RETICCTPCT in the last 72 hours. Urine analysis:    Component Value Date/Time   COLORURINE YELLOW 12/06/2017 [place] 12/06/2017 1524   LABSPEC 1.010 12/06/2017 1524   PHURINE 5.0 12/06/2017 1524   GLUCOSEU NEGATIVE 12/06/2017 1524   HGBUR MODERATE (A) 12/06/2017 1524   BILIRUBINUR NEGATIVE 12/06/2017 1524   KETONESUR NEGATIVE 12/06/2017 1524   PROTEINUR NEGATIVE 12/06/2017 1524   UROBILINOGEN 0.2 08/15/2013 1132   NITRITE NEGATIVE 12/06/2017 1524    LEUKOCYTESUR NEGATIVE 12/06/2017 1524   Sepsis Labs: @LABRCNTIP (procalcitonin:4,lacticidven:4)  ) Recent Results (from the past 240 hour(s))  Culture, blood (routine x 2)     Status: None (Preliminary result)   Collection Time: 12/06/17  3:40 PM  Result Value Ref Range Status   Specimen Description BLOOD LEFT ANTECUBITAL  Final   Special Requests   Final    BOTTLES DRAWN AEROBIC AND ANAEROBIC Blood Culture adequate volume   Culture   Final    NO GROWTH 4 DAYS Performed at [facility], [place] [address] [place], [place] [address]    Report Status PENDING  Incomplete  Culture, blood (routine x 2)     Status: Abnormal   Collection Time: 12/06/17  4:35 PM  Result Value Ref Range Status   Specimen Description BLOOD RIGHT ANTECUBITAL  Final   Special Requests   Final    BOTTLES DRAWN AEROBIC AND ANAEROBIC Blood Culture adequate volume   Culture  Setup Time   Final    GRAM POSITIVE COCCI IN PAIRS IN CHAINS IN BOTH AEROBIC AND ANAEROBIC BOTTLES CRITICAL RESULT CALLED TO, READ BACK BY AND VERIFIED WITH: B MANCHERIL,PHARMD AT 1003 12/07/17 BY L BENFIELD Performed at [facility], [place] [address] [place], [place] [address]    Culture ENTEROCOCCUS FAECALIS (A)  Final   Report Status 12/09/2017 FINAL  Final   Organism ID, Bacteria ENTEROCOCCUS FAECALIS  Final      Susceptibility   Enterococcus faecalis - MIC*    AMPICILLIN <=2 SENSITIVE Sensitive     VANCOMYCIN 1 SENSITIVE Sensitive     GENTAMICIN SYNERGY SENSITIVE Sensitive     * ENTEROCOCCUS FAECALIS  Blood Culture ID Panel (Reflexed)     Status: Abnormal   Collection Time: 12/06/17  4:35 PM  Result Value Ref Range Status   Enterococcus species DETECTED (A) NOT DETECTED Final    Comment: CRITICAL RESULT CALLED TO, READ BACK BY AND VERIFIED WITH: B MANCHERIL,PHARMD AT 1003 12/07/17 BY L BENFIELD    Vancomycin resistance NOT DETECTED NOT DETECTED Final   Listeria monocytogenes NOT DETECTED NOT DETECTED Final    Staphylococcus species NOT DETECTED NOT DETECTED Final   Staphylococcus aureus NOT DETECTED NOT DETECTED Final   Streptococcus species NOT DETECTED NOT DETECTED Final   Streptococcus agalactiae NOT DETECTED NOT DETECTED Final   Streptococcus pneumoniae NOT DETECTED NOT DETECTED Final   Streptococcus pyogenes NOT DETECTED NOT DETECTED Final   Acinetobacter baumannii NOT DETECTED NOT DETECTED Final   Enterobacteriaceae species NOT DETECTED NOT DETECTED Final   Enterobacter cloacae complex NOT DETECTED NOT DETECTED Final   Escherichia coli NOT DETECTED NOT DETECTED Final   Klebsiella oxytoca NOT DETECTED NOT DETECTED Final   Klebsiella pneumoniae NOT DETECTED NOT DETECTED Final   Proteus species NOT DETECTED NOT DETECTED Final   Serratia marcescens NOT DETECTED NOT DETECTED Final   Haemophilus influenzae NOT DETECTED NOT DETECTED Final   Neisseria meningitidis NOT DETECTED NOT DETECTED Final   Pseudomonas aeruginosa NOT DETECTED NOT DETECTED Final   Candida albicans NOT DETECTED NOT DETECTED Final   Candida glabrata NOT DETECTED NOT DETECTED Final   Candida krusei NOT DETECTED NOT DETECTED Final   Candida parapsilosis NOT DETECTED NOT DETECTED Final   Candida tropicalis NOT DETECTED NOT DETECTED Final    Comment: Performed at [facility], [place] [address] [place], [place] [address]  Culture, blood (routine x 2)     Status: None (Preliminary result)   Collection Time: 12/08/17  7:20 AM  Result Value Ref Range Status   Specimen Description BLOOD RIGHT HAND  Final   Special Requests   Final    BOTTLES DRAWN AEROBIC AND ANAEROBIC Blood Culture adequate volume   Culture   Final    NO GROWTH 2 DAYS Performed at [facility], [place] [address] [place], [place] [address]    Report Status PENDING  Incomplete  Culture, blood (routine x 2)     Status: None (Preliminary result)   Collection Time: 12/08/17  7:37 AM  Result Value Ref Range Status   Specimen Description BLOOD RIGHT  ANTECUBITAL  Final  Special Requests   Final    BOTTLES DRAWN AEROBIC AND ANAEROBIC Blood Culture results may not be optimal due to an excessive volume of blood received in culture bottles   Culture   Final    NO GROWTH 2 DAYS Performed at [facility], [place] [address], [place], [place] [address]    Report Status PENDING  Incomplete         Radiology Studies: No results found.      Scheduled Meds: . amiodarone  200 mg Oral BID  . apixaban  2.5 mg Oral BID  . carvedilol  3.125 mg Oral BID WC  . ezetimibe  10 mg Oral Daily  . ferrous sulfate  325 mg Oral Q supper  . levothyroxine  25 mcg Oral Once per day on Mon Wed Fri  . levothyroxine  50 mcg Oral Once per day on Sun Tue Thu Sat  . multivitamin with minerals  1 tablet Oral QHS  . pantoprazole  40 mg Oral Daily  . rosuvastatin  10 mg Oral QHS  . sodium chloride flush  3 mL Intravenous Q12H  . tamsulosin  0.4 mg Oral QHS   Continuous Infusions: . sodium chloride    . DAPTOmycin (CUBICIN)  IV Stopped (12/09/17 2202)     LOS: 4 days    Time spent: 10min  Edie Vallandingham [facility] Page via [email or web address], password [email or web address] After 7PM please contact night-coverage  12/10/2017, 4:25 PM

## 2017-12-10 NOTE — Progress Notes (Signed)
Called for 8 beat run of non sustained v tach on tele. Pt on b blocker. Consulted cardiology and placed order for magnesium and phosphorus. K is wnl but below 4.0  [facility]

## 2017-12-10 NOTE — Anesthesia Preprocedure Evaluation (Signed)
Anesthesia Evaluation  Patient identified by MRN, date of birth, ID band Patient awake    Reviewed: Allergy & Precautions, NPO status , Patient's Chart, lab work & pertinent test results, reviewed documented beta blocker date and time   Airway Mallampati: II  TM Distance: >3 FB     Dental   Pulmonary sleep apnea , COPD, former smoker,    breath sounds clear to auscultation       Cardiovascular hypertension, Pt. on medications and Pt. on home beta blockers + CAD, + Cardiac Stents, + Peripheral Vascular Disease and +CHF  + Cardiac Defibrillator  Rhythm:Regular Rate:Normal  EF 20-25%.   Neuro/Psych negative neurological ROS     GI/Hepatic Neg liver ROS, GERD  ,  Endo/Other  Hypothyroidism   Renal/GU CRFRenal disease     Musculoskeletal   Abdominal   Peds  Hematology  (+) anemia ,   Anesthesia Other Findings   Reproductive/Obstetrics                             Anesthesia Physical Anesthesia Plan  ASA: IV  Anesthesia Plan: MAC   Post-op Pain Management:    Induction: Intravenous  PONV Risk Score and Plan: 1 and Propofol infusion, Ondansetron and Treatment may vary due to age or medical condition  Airway Management Planned: Natural Airway and Nasal Cannula  Additional Equipment:   Intra-op Plan:   Post-operative Plan:   Informed Consent: I have reviewed the patients History and Physical, chart, labs and discussed the procedure including the risks, benefits and alternatives for the proposed anesthesia with the patient or authorized representative who has indicated his/her understanding and acceptance.     Plan Discussed with: CRNA  Anesthesia Plan Comments:         Anesthesia Quick Evaluation

## 2017-12-10 NOTE — Progress Notes (Signed)
Subjective: Patient denies fevers, chills, N/V, shortness of breath, productive cough. He feels his breathing and cough are better after using incentive spirometry.  Antibiotics:  Anti-infectives (From admission, onward)   Start     Dose/Rate Route Frequency Ordered Stop   12/09/17 1000  vancomycin (VANCOCIN) IVPB 1000 mg/200 mL premix  Status:  Discontinued     1,000 mg 200 mL/hr over 60 Minutes Intravenous Every 48 hours 12/07/17 1032 12/07/17 1241   12/08/17 0000  ciprofloxacin (CIPRO) IVPB 200 mg  Status:  Discontinued     200 mg 100 mL/hr over 60 Minutes Intravenous Every 24 hours 12/07/17 0102 12/07/17 0723   12/07/17 1400  DAPTOmycin (CUBICIN) 650 mg in sodium chloride 0.9 % IVPB     650 mg 226 mL/hr over 30 Minutes Intravenous Every 48 hours 12/07/17 1254     12/07/17 1100  vancomycin (VANCOCIN) 1,500 mg in sodium chloride 0.9 % 500 mL IVPB     1,500 mg 250 mL/hr over 120 Minutes Intravenous  Once 12/07/17 1016 12/07/17 1320   12/07/17 0800  cefTRIAXone (ROCEPHIN) 1 g in sodium chloride 0.9 % 100 mL IVPB  Status:  Discontinued     1 g 200 mL/hr over 30 Minutes Intravenous Every 24 hours 12/07/17 0723 12/07/17 1241   12/07/17 0115  ciprofloxacin (CIPRO) IVPB 200 mg     200 mg 100 mL/hr over 60 Minutes Intravenous  Once 12/07/17 0102 12/07/17 0320      Medications: Scheduled Meds: . amiodarone  200 mg Oral BID  . apixaban  2.5 mg Oral BID  . carvedilol  3.125 mg Oral BID WC  . ezetimibe  10 mg Oral Daily  . ferrous sulfate  325 mg Oral Q supper  . levothyroxine  25 mcg Oral Once per day on Mon Wed Fri  . levothyroxine  50 mcg Oral Once per day on Sun Tue Thu Sat  . multivitamin with minerals  1 tablet Oral QHS  . pantoprazole  40 mg Oral Daily  . rosuvastatin  10 mg Oral QHS  . sodium chloride flush  3 mL Intravenous Q12H  . tamsulosin  0.4 mg Oral QHS   Continuous Infusions: . sodium chloride    . DAPTOmycin (CUBICIN)  IV Stopped (12/09/17 2202)    PRN Meds:.sodium chloride, acetaminophen **OR** acetaminophen, diphenhydrAMINE **AND** acetaminophen, ondansetron (ZOFRAN) IV, promethazine, sodium chloride, sodium chloride flush  Objective: Weight change: 1 lb 4.7 oz (0.587 kg)  Intake/Output Summary (Last 24 hours) at 12/10/2017 1238 Last data filed at 12/10/2017 1200 Gross per 24 hour  Intake 842 ml  Output 1300 ml  Net -458 ml   Blood pressure (!) 111/55, pulse 83, temperature 98.4 F (36.9 C), temperature source Oral, resp. rate (!) 27, height 5\' 10"  (1.778 m), weight 186 lb 11.2 oz (84.7 kg), SpO2 93 %. Temp:  [98.4 F (36.9 C)-100.1 F (37.8 C)] 98.4 F (36.9 C) (02/27 1205) Pulse Rate:  [77-128] 83 (02/27 1210) Resp:  [20-32] 27 (02/27 1210) BP: (78-119)/(36-87) 111/55 (02/27 1205) SpO2:  [88 %-96 %] 93 % (02/27 1210) Weight:  [186 lb 11.2 oz (84.7 kg)] 186 lb 11.2 oz (84.7 kg) (02/27 1020)  Physical Exam: General: Alert and awake, oriented x3, not in any acute distress. HEENT: anicteric sclera, EOMI CVS: RRR, no M/R/G Chest: CTAB, no increased work of breathing; ICD on left anterior chest w/o tenderness, induration. Abdomen: soft nontender, nondistended, normal bowel sounds.  Extremities: no edema Skin: no rashes  Neuro: nonfocal  CBC: CBC Latest Ref Rng & Units 12/10/2017 12/08/2017 12/07/2017  WBC 4.0 - 10.5 K/uL 6.7 13.8(H) 11.9(H)  Hemoglobin 13.0 - 17.0 g/dL 9.6(L) 10.2(L) 10.9(L)  Hematocrit 39.0 - 52.0 % 30.1(L) 32.9(L) 34.8(L)  Platelets 150 - 400 K/uL 127(L) 130(L) 160    BMET Recent Labs    12/09/17 0851 12/10/17 0230  NA 137 136  K 3.6 3.8  CL 108 106  CO2 20* 18*  GLUCOSE 121* 110*  BUN 45* 49*  CREATININE 3.27* 3.23*  CALCIUM 8.0* 8.1*    Liver Panel  No results for input(s): PROT, ALBUMIN, AST, ALT, ALKPHOS, BILITOT, BILIDIR, IBILI in the last 72 hours.  Sedimentation Rate No results for input(s): ESRSEDRATE in the last 72 hours. C-Reactive Protein No results for input(s): CRP in  the last 72 hours.  Micro Results: Recent Results (from the past 720 hour(s))  Culture, blood (routine x 2)     Status: None (Preliminary result)   Collection Time: 12/06/17  3:40 PM  Result Value Ref Range Status   Specimen Description BLOOD LEFT ANTECUBITAL  Final   Special Requests   Final    BOTTLES DRAWN AEROBIC AND ANAEROBIC Blood Culture adequate volume   Culture   Final    NO GROWTH 4 DAYS Performed at [facility], [address] [place], [place] [address]    Report Status PENDING  Incomplete  Culture, blood (routine x 2)     Status: Abnormal   Collection Time: 12/06/17  4:35 PM  Result Value Ref Range Status   Specimen Description BLOOD RIGHT ANTECUBITAL  Final   Special Requests   Final    BOTTLES DRAWN AEROBIC AND ANAEROBIC Blood Culture adequate volume   Culture  Setup Time   Final    GRAM POSITIVE COCCI IN PAIRS IN CHAINS IN BOTH AEROBIC AND ANAEROBIC BOTTLES CRITICAL RESULT CALLED TO, READ BACK BY AND VERIFIED WITH: B MANCHERIL,PHARMD AT 1003 12/07/17 BY L BENFIELD Performed at [facility], [place] [address] [place], [place] [address]    Culture ENTEROCOCCUS FAECALIS (A)  Final   Report Status 12/09/2017 FINAL  Final   Organism ID, Bacteria ENTEROCOCCUS FAECALIS  Final      Susceptibility   Enterococcus faecalis - MIC*    AMPICILLIN <=2 SENSITIVE Sensitive     VANCOMYCIN 1 SENSITIVE Sensitive     GENTAMICIN SYNERGY SENSITIVE Sensitive     * ENTEROCOCCUS FAECALIS  Blood Culture ID Panel (Reflexed)     Status: Abnormal   Collection Time: 12/06/17  4:35 PM  Result Value Ref Range Status   Enterococcus species DETECTED (A) NOT DETECTED Final    Comment: CRITICAL RESULT CALLED TO, READ BACK BY AND VERIFIED WITH: B MANCHERIL,PHARMD AT 1003 12/07/17 BY L BENFIELD    Vancomycin resistance NOT DETECTED NOT DETECTED Final   Listeria monocytogenes NOT DETECTED NOT DETECTED Final   Staphylococcus species NOT DETECTED NOT DETECTED Final   Staphylococcus  aureus NOT DETECTED NOT DETECTED Final   Streptococcus species NOT DETECTED NOT DETECTED Final   Streptococcus agalactiae NOT DETECTED NOT DETECTED Final   Streptococcus pneumoniae NOT DETECTED NOT DETECTED Final   Streptococcus pyogenes NOT DETECTED NOT DETECTED Final   Acinetobacter baumannii NOT DETECTED NOT DETECTED Final   Enterobacteriaceae species NOT DETECTED NOT DETECTED Final   Enterobacter cloacae complex NOT DETECTED NOT DETECTED Final   Escherichia coli NOT DETECTED NOT DETECTED Final   Klebsiella oxytoca NOT DETECTED NOT DETECTED Final  Klebsiella pneumoniae NOT DETECTED NOT DETECTED Final   Proteus species NOT DETECTED NOT DETECTED Final   Serratia marcescens NOT DETECTED NOT DETECTED Final   Haemophilus influenzae NOT DETECTED NOT DETECTED Final   Neisseria meningitidis NOT DETECTED NOT DETECTED Final   Pseudomonas aeruginosa NOT DETECTED NOT DETECTED Final   Candida albicans NOT DETECTED NOT DETECTED Final   Candida glabrata NOT DETECTED NOT DETECTED Final   Candida krusei NOT DETECTED NOT DETECTED Final   Candida parapsilosis NOT DETECTED NOT DETECTED Final   Candida tropicalis NOT DETECTED NOT DETECTED Final    Comment: Performed at [facility], [place] [address], [place], [place] [address]  Culture, blood (routine x 2)     Status: None (Preliminary result)   Collection Time: 12/08/17  7:20 AM  Result Value Ref Range Status   Specimen Description BLOOD RIGHT HAND  Final   Special Requests   Final    BOTTLES DRAWN AEROBIC AND ANAEROBIC Blood Culture adequate volume   Culture   Final    NO GROWTH 2 DAYS Performed at [facility], [place] [address], [place], [place] [address]    Report Status PENDING  Incomplete  Culture, blood (routine x 2)     Status: None (Preliminary result)   Collection Time: 12/08/17  7:37 AM  Result Value Ref Range Status   Specimen Description BLOOD RIGHT ANTECUBITAL  Final   Special Requests   Final    BOTTLES DRAWN AEROBIC AND  ANAEROBIC Blood Culture results may not be optimal due to an excessive volume of blood received in culture bottles   Culture   Final    NO GROWTH 2 DAYS Performed at [facility], [place] [address], [place], [place] [address]    Report Status PENDING  Incomplete    Studies/Results: No results found.  Assessment/Plan:  INTERVAL HISTORY:   Principal Problem:   Nausea & vomiting Active Problems:   Atrial fibrillation (HCC)   UTI (urinary tract infection)   CKD (chronic kidney disease) stage 4, GFR 15-29 ml/min (HCC)   Renal insufficiency   Enterococcal bacteremia  [name] is a 84 y.o. male with h/o AICD currently hospitalized with E faecalis bacteremia from urinary source. Patient was initially on Cipro, rocephin and vanc (x1 dose each) prior to blood cultures resulting; he has since been switched to only daptomycin in setting of AKI on CKD. No further fevers. TTE did not reveal vegetation. TEE did not show obvious vegetation however was poor image quality 2/2 large hiatal hernia. He has remained afebrile and WBC was normal today (6.7 from 13.8 yesterday).  Plan: --continue daptomycin; as echo does not show vegetations (poor image quality due to hiatal hernia) can consider shorter coarse (2wks from 2/25), however he does have ICD in place - Dr. Baxter Flattery to make final recs. --f/u repeat blood cultures from 2/25 x2days --PICC placement ordered --encouraged incentive spirometer and flutter valve use for likely atelectasis   LOS: 4 days   Alphonzo Grieve, MD PGY - 2 Infectious Disease [phone] 12/10/2017, 12:38 PM

## 2017-12-10 NOTE — Progress Notes (Signed)
Verified with Dr. Wendee Beavers via text page and phone conversation that PICC line should be placed with pt having a Crt of 3.23 and creatinine clearance of < 30 (17.6 ml/min).  Dr. Wendee Beavers stated that this was in agreement with the plan of care as per Infectious Disease.  Notified IV team that MD approves the placement of the PICC line.

## 2017-12-10 NOTE — Progress Notes (Signed)
Peripherally Inserted Central Catheter/Midline Placement  The IV Nurse has discussed with the patient and/or persons authorized to consent for the patient, the purpose of this procedure and the potential benefits and risks involved with this procedure.  The benefits include less needle sticks, lab draws from the catheter, and the patient may be discharged home with the catheter. Risks include, but not limited to, infection, bleeding, blood clot (thrombus formation), and puncture of an artery; nerve damage and irregular heartbeat and possibility to perform a PICC exchange if needed/ordered by physician.  Alternatives to this procedure were also discussed.  Bard Power PICC patient education guide, fact sheet on infection prevention and patient information card has been provided to patient /or left at bedside.    PICC/Midline Placement Documentation  PICC Single Lumen 57/90/38 PICC Right Basilic 44 cm 0 cm (Active)  Indication for Insertion or Continuance of Line Home intravenous therapies (PICC only) 12/10/2017  7:00 PM  Exposed Catheter (cm) 0 cm 12/10/2017  7:00 PM  Site Assessment Clean;Dry;Intact 12/10/2017  7:00 PM  Line Status Flushed;Saline locked;Blood return noted 12/10/2017  7:00 PM  Dressing Type Transparent;Securing device 12/10/2017  7:00 PM  Dressing Status Clean;Dry;Intact;Antimicrobial disc in place 12/10/2017  7:00 PM  Dressing Change Due 12/17/17 12/10/2017  7:00 PM       Frances Maywood 12/10/2017, 7:23 PM

## 2017-12-10 NOTE — CV Procedure (Signed)
   TEE:  Ind: ? Endocarditis  Time out performed  Propofol anesthesia  Findings:  -EF 30% -moderate MR -No obvious vegetation -Overall image quality was suboptimal likely secondary to moderate sized hiatal hernia.   Discussed with family.   Candee Furbish, MD

## 2017-12-11 ENCOUNTER — Other Ambulatory Visit (HOSPITAL_COMMUNITY): Payer: Self-pay | Admitting: *Deleted

## 2017-12-11 ENCOUNTER — Encounter (HOSPITAL_COMMUNITY): Payer: Self-pay | Admitting: Cardiology

## 2017-12-11 DIAGNOSIS — I255 Ischemic cardiomyopathy: Secondary | ICD-10-CM

## 2017-12-11 DIAGNOSIS — I4891 Unspecified atrial fibrillation: Secondary | ICD-10-CM

## 2017-12-11 DIAGNOSIS — Z4502 Encounter for adjustment and management of automatic implantable cardiac defibrillator: Secondary | ICD-10-CM

## 2017-12-11 DIAGNOSIS — I472 Ventricular tachycardia: Secondary | ICD-10-CM

## 2017-12-11 DIAGNOSIS — I5022 Chronic systolic (congestive) heart failure: Secondary | ICD-10-CM

## 2017-12-11 DIAGNOSIS — Z95810 Presence of automatic (implantable) cardiac defibrillator: Secondary | ICD-10-CM

## 2017-12-11 DIAGNOSIS — I480 Paroxysmal atrial fibrillation: Secondary | ICD-10-CM

## 2017-12-11 LAB — BASIC METABOLIC PANEL
Anion gap: 9 (ref 5–15)
BUN: 48 mg/dL — AB (ref 6–20)
CO2: 22 mmol/L (ref 22–32)
CREATININE: 2.9 mg/dL — AB (ref 0.61–1.24)
Calcium: 8.1 mg/dL — ABNORMAL LOW (ref 8.9–10.3)
Chloride: 106 mmol/L (ref 101–111)
GFR calc Af Amer: 21 mL/min — ABNORMAL LOW (ref >60)
GFR, EST NON AFRICAN AMERICAN: 18 mL/min — AB (ref >60)
GLUCOSE: 132 mg/dL — AB (ref 65–99)
Potassium: 3.6 mmol/L (ref 3.5–5.1)
SODIUM: 137 mmol/L (ref 135–145)

## 2017-12-11 LAB — CULTURE, BLOOD (ROUTINE X 2)
CULTURE: NO GROWTH
Special Requests: ADEQUATE

## 2017-12-11 MED ORDER — FUROSEMIDE 20 MG PO TABS: 40.0000 mg | ORAL_TABLET | Freq: Every day | ORAL | 2 refills | Status: DC

## 2017-12-11 MED ORDER — SODIUM CHLORIDE 0.9 % IV SOLN
650.0000 mg | INTRAVENOUS | Status: DC
  Administered 2017-12-11: 650 mg via INTRAVENOUS
  Filled 2017-12-11 (×2): qty 13

## 2017-12-11 MED ORDER — POTASSIUM CHLORIDE CRYS ER 20 MEQ PO TBCR
40.0000 meq | EXTENDED_RELEASE_TABLET | Freq: Once | ORAL | Status: AC
Start: 2017-12-11 — End: 2017-12-11
  Administered 2017-12-11: 40 meq via ORAL
  Filled 2017-12-11: qty 2

## 2017-12-11 NOTE — Care Management Important Message (Signed)
Important Message  Patient Details  Name: [name] MRN: [id number] Date of Birth: 1932-02-07   Medicare Important Message Given:  Yes    Orbie Pyo 12/11/2017, 1:19 PM

## 2017-12-11 NOTE — Progress Notes (Signed)
[facility]  Active pt with [facility] for [facility] services prior to this admission.  [facility] will provide HHRN, HHA, PT and home Infusion services at DC.  Pt will DC on IV ABX - Daptomycin. I have shared copay for drug and supplies and wife is in agreement they are comfortable paying since therapy end date is 12/21/17. [facility] will provide hands on training regarding IV ABX administration (daptomycin via IV Push) with wife tomorrow before DC to home. If patient discharges after hours, please call [phone].   Larry Sierras 12/11/2017, 4:51 PM

## 2017-12-11 NOTE — Care Management Note (Addendum)
Case Management Note  Patient Details  Name: [name] MRN: [id number] Date of Birth: 1932-03-05  Subjective/Objective:  N/V, Bacteremia                 Action/Plan: Patient lives at home with spouse; PCP: [name], MD; has private insurance with  Medicare; patient will possibly go home on IV antibiotics; Carolynn Sayers RN with [facility] called for approval from insurance company for home IV antibiotics. CM will continue to follow for progression of care.  Physical Therapy to eval for mobility.  1:45 pm - CM talked to patient/ spouse for [facility] choice, they chose [place]; Butch Penny with [facility] called for arrangements; DME- has a walker and cane at home; PCP is Dr Virgina Jock; [facility] choice [facility]. Aneta Mins  Expected Discharge Date:    possibly 12/12/2017              Expected Discharge Plan:  [place]  Discharge planning Services  CM Consult  Status of Service:  In process, will continue to follow  Sherrilyn Rist [phone] 12/11/2017, 10:40 AM

## 2017-12-11 NOTE — Consult Note (Addendum)
Cardiology Consultation:   Patient ID: [name]; [id number]; 08/19/1932   Admit date: 12/06/2017 Date of Consult: 12/11/2017  Primary Care Provider: [name], MD Primary Cardiologist: Dr Haroldine Laws 10/24/2017 Primary Electrophysiologist: Dr Caryl Comes   Patient Profile:   [name] is a 84 y.o. male with a hx of Ant MI 2005 & stent LAD, stent OM 2006, S-CHF w/ EF 25% s/p StJ ICD, afib on amio and Eliquis, CKD III, HTN, HLD, tremor, bladder CA, hypothyroid, who is being seen today for the evaluation of tachycardia at the request of Dr Wendee Beavers.  History of Present Illness:   [name] was admitted abd pain, N&V, ?dysuria on 02/23. K+ 3.3, Cr 2.57, dx UTI and sepsis w/ bacteremia. Cr increased to 3.49 on 02/25, Lasix 40 mg qd started 02/27.   Echo and TEE performed, no vegetations and EF 20-25%.   Pt maintaining SR, on BB, Amio and Eliquis. Pt w/ VT on telemetry, cards consult called.   [name] almost never has palpitations.  Since he has been in the hospital, he has not had any.  He has not had any presyncope or near syncope.  He has not been lightheaded or dizzy.  If he has any atrial fibrillation, he is not aware of it.   He is compliant with a low-sodium diet and daily weights.  His volume status is good right now and he feels that his respiratory status and weight are at baseline.  He has not had any chest pain and reports no ischemic symptoms.  The last time he had palpitations was 4-6 weeks ago.  He felt his heart flutter, but was otherwise asymptomatic with it.  This was after he had seen Dr. Haroldine Laws on January 11.  His device was interrogated on January 11, he had had less than 1% atrial fibrillation, 99% BiV pacing, no VT/VF.   Past Medical History:  Diagnosis Date  . Acute bronchitis with COPD ([place]) 09/10/2016  . Anemia   . Atrial fibrillation or flutter    maintaining sinus on amiodarone    . Bladder cancer ([place]) dx'd 05/2012  . BPH (benign  prostatic hyperplasia)   . CAD (coronary artery disease)     a. s/p anterior MI 12/05 c/b shock -> stent LAD   b. s/p stenting OM-1, 2/06  . CHF (congestive heart failure) (HCC)    due to ischemic CM  a. EF 20-30%. (Nov 2008)   b. s/p St. Jude BiV-ICD    c. CPX 07/2008  pvo2 16.3 (63% predicted) slope 34 RER 1.08 O2 pulse 93%  . Cough    occasional /productive, no fever/ not new  . CRI (chronic renal insufficiency)    (baseline 2.0-2.2)/ recent hospitalization 8/13  . GERD (gastroesophageal reflux disease)    hx Barretts esophagitis  . History of blood transfusion    following coumadin  usage  . HTN (hypertension)    EKG 05/14/12,Chest x ray 8/13, last ICD interrogation 8/13 EPIC  . Hyperlipidemia   . Hypothyroidism   . 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 Skin cancer    basal cells   facial x 4, 1 right forearm  . Sleep apnea    STOP BANG SCORE 4    Past Surgical History:  Procedure Laterality Date  . [place]   lower back  . CARDIAC DEFIBRILLATOR  PLACEMENT     ICD St Jude; gen change 09-20-13  . CERVICAL LAMINECTOMY  1985  . COLONOSCOPY    . CORONARY ANGIOPLASTY  2005,2006  . CYSTOSCOPY W/ RETROGRADES  05/25/2012   Procedure: CYSTOSCOPY WITH RETROGRADE PYELOGRAM;  Surgeon: Molli Hazard, MD;  Location: [facility];  Service: Urology;  Laterality: Bilateral;  . CYSTOSCOPY W/ URETERAL STENT PLACEMENT  07/08/2012   Procedure: CYSTOSCOPY WITH STENT REPLACEMENT;  Surgeon: Molli Hazard, MD;  Location: [facility];  Service: Urology;  Laterality: Left;  . CYSTOSCOPY W/ URETERAL STENT REMOVAL  07/08/2012   Procedure: CYSTOSCOPY WITH STENT REMOVAL;  Surgeon: Molli Hazard, MD;  Location: [facility];  Service: Urology;  Laterality: Bilateral;  . CYSTOSCOPY/RETROGRADE/URETEROSCOPY  07/08/2012   Procedure: CYSTOSCOPY/RETROGRADE/URETEROSCOPY;   Surgeon: Molli Hazard, MD;  Location: [facility];  Service: Urology;  Laterality: Left;  . ESOPHAGOGASTRODUODENOSCOPY    . HERNIA REPAIR  1989   bilateral  . IMPLANTABLE CARDIOVERTER DEFIBRILLATOR (ICD) GENERATOR CHANGE N/A 09/20/2013   Procedure: ICD GENERATOR CHANGE;  Surgeon: Deboraha Sprang, MD;  Location: [facility] CATH LAB;  Service: Cardiovascular;  Laterality: N/A;  . TRANSURETHRAL RESECTION OF BLADDER TUMOR  05/25/2012   cold cup biopsy prostate  . TRANSURETHRAL RESECTION OF BLADDER TUMOR  07/08/2012   Procedure: TRANSURETHRAL RESECTION OF BLADDER TUMOR (TURBT);  Surgeon: Molli Hazard, MD;  Location: [facility];  Service: Urology;  Laterality: N/A;  CYSTO, TURBT W/ GYRUS, BILATERAL STENT REMOVAL, LEFT URETEROSCOPY WITH BIOPSY, POSSIBLE LEFT STENT PLACEMENT      Prior to Admission medications   Medication Sig  acetaminophen (TYLENOL) 325 MG tablet Take 2 tablets (650 mg total) by mouth every 6 (six) hours as needed for mild pain (or Fever >/= 101). Patient taking differently: Take 1,000 mg by mouth every 6 (six) hours as needed for mild pain or headache.   amiodarone (PACERONE) 100 MG tablet Take 2 tablets (200 mg total) by mouth daily. Patient taking differently: Take 200 mg by mouth 2 (two) times daily.   apixaban (ELIQUIS) 2.5 MG TABS tablet Take 1 tablet (2.5 mg total) by mouth 2 (two) times daily.  carvedilol (COREG) 6.25 MG tablet TAKE 1 TABLET BY MOUTH TWICE DAILY WITH A MEAL Patient taking differently: TAKE 1 TABLET (6.25mg ) BY MOUTH TWICE DAILY WITH A MEAL  diphenhydramine-acetaminophen (TYLENOL PM) 25-500 MG TABS tablet Take 1 tablet by mouth at bedtime as needed (sleep).  ezetimibe (ZETIA) 10 MG tablet TAKE ONE TABLET BY MOUTH ONCE DAILY WITH SUPPER Patient taking differently: TAKE ONE TABLET (10mg ) BY MOUTH ONCE DAILY WITH SUPPER  ferrous sulfate ([facility] IRON) 325 (65 FE) MG tablet Take 325 mg by mouth daily with supper.   levothyroxine (SYNTHROID, LEVOTHROID) 50 MCG tablet  Take 25-50 mcg by mouth daily before breakfast. Take 50 mcg (1 tablet) on Tues, Thurs, Sat, Sun and 25 mcg (1/2 tablet) on Mon, Wed, Fri  Magnesium Hydroxide (MILK OF MAGNESIA PO) Take 20 mLs by mouth daily as needed (for constipation).  Multiple Vitamins-Minerals (CENTRUM SILVER PO) Take 1 tablet by mouth at bedtime.   nitroGLYCERIN (NITROSTAT) 0.4 MG SL tablet DISSOLVE ONE TABLET UNDER THE TONGUE EVERY 5 MINUTES AS NEEDED FOR CHEST PAIN.  DO NOT EXCEED A TOTAL OF 3 DOSES IN 15 MINUTES  pantoprazole (PROTONIX) 40 MG tablet Take 40 mg by mouth daily.   rosuvastatin (CRESTOR) 20 MG tablet Take 1 tablet (20 mg total) by mouth daily. Patient taking differently: Take 20 mg by mouth at bedtime.  sodium chloride (OCEAN) 0.65 % SOLN nasal spray Place 1-2 sprays into both nostrils daily as needed for congestion.   tamsulosin (FLOMAX) 0.4 MG CAPS capsule Take 0.4 mg by mouth at bedtime.   furosemide (LASIX) 20 MG tablet Take 2 tablets (40 mg total) by mouth daily.    Inpatient Medications: Scheduled Meds: . amiodarone  200 mg Oral BID  . apixaban  2.5 mg Oral BID  . carvedilol  3.125 mg Oral BID WC  . ezetimibe  10 mg Oral Daily  . ferrous sulfate  325 mg Oral Q supper  . furosemide  40 mg Oral Daily  . levothyroxine  25 mcg Oral Once per day on Mon Wed Fri  . levothyroxine  50 mcg Oral Once per day on Sun Tue Thu Sat  . multivitamin with minerals  1 tablet Oral QHS  . pantoprazole  40 mg Oral Daily  . potassium chloride  10 mEq Oral Daily  . rosuvastatin  10 mg Oral QHS  . sodium chloride flush  10-40 mL Intracatheter Q12H  . sodium chloride flush  3 mL Intravenous Q12H  . tamsulosin  0.4 mg Oral QHS   Continuous Infusions: . sodium chloride    . DAPTOmycin (CUBICIN)  IV     PRN Meds: sodium chloride, acetaminophen **OR** acetaminophen, diphenhydrAMINE **AND** acetaminophen, ondansetron (ZOFRAN) IV, promethazine, sodium chloride, sodium chloride flush, sodium chloride flush  Allergies:     Allergies  Allergen Reactions  . Ramipril Other (See Comments)    cough  . Penicillins Itching and Rash    Has patient had a PCN reaction causing immediate rash, facial/tongue/throat swelling, SOB or lightheadedness with hypotension: No Has patient had a PCN reaction causing severe rash involving mucus membranes or skin necrosis: Yes-back and hands. Has patient had a PCN reaction that required hospitalization: No Has patient had a PCN reaction occurring within the last 10 years: Yes If all of the above answers are "NO", then may proceed with Cephalosporin use.    Social History:   Social History   Socioeconomic History  . Marital status: Married    Spouse name: Not on file  . Number of children: Not on file  . Years of education: Not on file  . Highest education level: Not on file  Social Needs  . Financial resource strain: Not on file  . Food insecurity - worry: Not on file  . Food insecurity - inability: Not on file  . Transportation needs - medical: Not on file  . Transportation needs - non-medical: Not on file  Occupational History  . Occupation: retired  Tobacco Use  . Smoking status: Former Smoker    Types: Cigarettes, Cigars    Last attempt to quit: 03/02/2004    Years since quitting: 13.7  . Smokeless tobacco: Former Systems developer    Quit date: 05/20/1999  . Tobacco comment: quit in 2005  Substance and Sexual Activity  . Alcohol use: No  . Drug use: No  . Sexual activity: Not on file  Other Topics Concern  . Not on file  Social History Narrative  . Not on file    Family History:   Family History  Problem Relation Age of Onset  . Coronary artery disease Unknown   . Stroke Unknown    Family Status:  Family Status  Relation Name Status  . Mother  Deceased at age 86       familial tremor, stroke  . Father  Deceased  cerebral hemorrhage  . Brother x3 Deceased       emphysema   . Son  Alive       DM, MI  . Unknown  (Not Specified)  . Unknown  (Not  Specified)    ROS:  Please see the history of present illness.  All other ROS reviewed and negative.     Physical Exam/Data:   Vitals:   12/11/17 0428 12/11/17 0742 12/11/17 1133 12/11/17 1603  BP: (!) 101/54 104/65  (!) 163/70  Pulse:  81  (!) 105  Resp:  (!) 23  (!) 24  Temp: 99.5 F (37.5 C) 98.7 F (37.1 C) 98.8 F (37.1 C) 98.1 F (36.7 C)  TempSrc: Oral Oral Oral Oral  SpO2:  94%  96%  Weight: 188 lb 11.4 oz (85.6 kg)     Height:        Intake/Output Summary (Last 24 hours) at 12/11/2017 1605 Last data filed at 12/11/2017 1440 Gross per 24 hour  Intake 480 ml  Output 2900 ml  Net -2420 ml   Filed Weights   12/10/17 0240 12/10/17 1020 12/11/17 0428  Weight: 186 lb 11.2 oz (84.7 kg) 186 lb 11.2 oz (84.7 kg) 188 lb 11.4 oz (85.6 kg)   Body mass index is 27.08 kg/m.  General:  Well nourished, well developed, in no acute distress HEENT: normal Lymph: no adenopathy Neck: no JVD Endocrine:  No thryomegaly Vascular: No carotid bruits; 4/4 extremity pulses 2+, without bruits  Cardiac:  normal S1, S2; RRR; no murmur  Lungs:  clear to auscultation bilaterally, no wheezing, rhonchi or rales  Abd: soft, nontender, no hepatomegaly  Ext: no edema Musculoskeletal:  No deformities, BUE and BLE strength normal and equal Skin: warm and dry  Neuro:  CNs 2-12 intact, no focal abnormalities noted Psych:  Normal affect   EKG:  The EKG was personally reviewed and demonstrates: 2/23 ECG is sinus rhythm with BiV pacing, heart rate 69 Telemetry:  Telemetry was personally reviewed and demonstrates: Sinus rhythm, brief episodes of atrial fibrillation, heart rate 100-120, QRS wide at baseline, artifact with pacing spikes giving the impression of atrial fib but I believe this is false, one episode of VT on 2/27 at about 4:00 in the afternoon  Relevant CV Studies:  TEE: 12/10/2017 - Left ventricle: Systolic function was severely reduced. The   estimated ejection fraction was in the  range of 20% to 25%. Wall   motion was normal; there were no regional wall motion   abnormalities. - Aortic valve: There was trivial regurgitation. - Mitral valve: There was moderate regurgitation. - Poor images, likely inhibited by moderate hiatal hernia. However,   no gross evidence of vegetation.  ECHO: 12/08/2017 - Left ventricle: The cavity size was moderately dilated. Wall   thickness was normal. Systolic function was severely reduced. The   estimated ejection fraction was in the range of 20% to 25%.   Severe diffuse hypokinesis with distinct regional wall motion   abnormalities. Dyskinesis and scarring of the apical myocardium.   Akinesis and scarring of the anteroseptal and anterior   myocardium; consistent with infarction in the distribution of the   left anterior descending coronary artery. - Ventricular septum: Septal motion showed abnormal function,   dyssynergy, and paradox. These changes are consistent with   intraventricular conduction delay. - Aortic valve: There was mild regurgitation. - Mitral valve: There was mild to moderate regurgitation directed   centrally. - Left atrium: The atrium was mildly dilated. -  Right atrium: The atrium was mildly dilated. - Atrial septum: No defect or patent foramen ovale was identified. Impressions: - There was no evidence of a vegetation.  PCI: 11/20/2004  PROCEDURE NOTE:  A 6-French sheath was placed in the right femoral artery.  Angiomax was administered per protocol.  We used a 6-French CLS 3.5 guiding catheter.  We initially performed angiography of the  left anterior  descending artery. This revealed the stent in proximal LAD to be widely  patent with proximal 20% stenosis within the mid portion of stent.  The  diagonal branch was also widely patent.  The mid to distal LAD showed mild  luminal irregularities.  Angiography of the left circumflex revealed a  complex 80% stenosis in the proximal portion of the first obtuse  marginal  branch.  An Asahi soft coronary guidewire was advanced under fluoroscopic  guidance into the distal aspect of the obtuse marginal branch.  We then  performed PTCA with a 2.5 x 12 mm Quantum balloon inflated to 10  atmospheres.  Following this, we deployed a 2.5 x 13 mm Cypher stent at a  deployment pressure of 11 atmospheres.  We then went back with our 2.5 x 12 mm Quantum balloon and inflated this to 15 atmospheres within the stent.  Intermittent doses of intracoronary nitroglycerin were administered.  Final  angiographic images were obtained showing patency of the obtuse marginal  with 0% residual stenosis at the stent site and TIMI III flow.  At the conclusion of procedure, an AngioSeal vascular closure device was  placed in the right femoral artery with good hemostasis.  RESULTS:  Successful PTCA placement of a drug-eluting stent in the first  obtuse marginal branch.  An 80% stenosis was reduced to 0% residual with  TIMI III flow.  CATH: 09/18/2004   Coronary angiography  Left main has a 20% stenosis.  Left anterior descending artery is widely patent at the stent site and the  proximal and mid LAD with 0% stenosis within the stent and TIMI III flow  into the distal vessel.  The LAD gives rise to a small first diagonal and  large second diagonal branch.  The second diagonal branch arises from within  the stented segment of the LAD.  There is a residual 30% stenosis in the  ostium of the second diagonal branch with TIMI III flow into this vessel.  Left circumflex is unchanged from before with a 50% stenosis in the ostium  and an 80% stenosis in the proximal portion of a large first obtuse marginal  branch.  IMPRESSION:  1.  Elevated left ventricular end diastolic pressure.  2.  Patent stent in the left anterior descending artery and patent      angioplasty site of the second diagonal branch.  3.  No change in disease in the left circumflex as described.  Laboratory  Data:  Chemistry Recent Labs  Lab 12/09/17 0851 12/10/17 0230 12/11/17 0914  NA 137 136 137  K 3.6 3.8 3.6  CL 108 106 106  CO2 20* 18* 22  GLUCOSE 121* 110* 132*  BUN 45* 49* 48*  CREATININE 3.27* 3.23* 2.90*  CALCIUM 8.0* 8.1* 8.1*  GFRNONAA 16* 16* 18*  GFRAA 19* 19* 21*  ANIONGAP 9 12 9     Lab Results  Component Value Date   ALT 14 (L) 12/07/2017   AST 30 12/07/2017   ALKPHOS 50 12/07/2017   BILITOT 0.8 12/07/2017   Hematology Recent Labs  Lab 12/07/17 0234 12/08/17  0703 12/10/17 0230  WBC 11.9* 13.8* 6.7  RBC 3.73* 3.51* 3.21*  HGB 10.9* 10.2* 9.6*  HCT 34.8* 32.9* 30.1*  MCV 93.3 93.7 93.8  MCH 29.2 29.1 29.9  MCHC 31.3 31.0 31.9  RDW 14.3 14.8 15.1  PLT 160 130* 127*   Cardiac Enzymes  Recent Labs  Lab 12/06/17 1606  TROPIPOC 0.00    BNP Recent Labs  Lab 12/06/17 2130  BNP 400.9*    TSH:  Lab Results  Component Value Date   TSH 2.885 12/07/2017   Magnesium:  Magnesium  Date Value Ref Range Status  12/10/2017 2.8 (H) 1.7 - 2.4 mg/dL Final    Comment:    Performed at [facility], [place] [address] [place], [place] [address]     Radiology/Studies:  [place] Ekg Site Rite  Result Date: 12/10/2017 If [facility] image not attached, placement could not be confirmed due to current cardiac rhythm.   Assessment and Plan:   1. NSVT:  -Telemetry reviewed -He had 9.8 seconds of VT on 02/27 -He was asymptomatic with this. -Other strips showed artifact with pacing spikes but he also had some episodes that appear to be atrial fib, RVR between 101-120, wide QRS at baseline. -He has not had any palpitations since admission.  His last episode of palpitations was 4-6 weeks ago.  He believes it was after his last interrogation. -We will get his Eclectic ICD interrogated to determine if there is significant VT or if this was an isolated episode in the setting of an acute illness. -Supplement potassium to keep it closer to 4. -Follow magnesium as  well, do not believe a level of 2.8 is harmful but will keep track of it. -TSH was normal on admission, he is on his home dose of Synthroid -Continue amiodarone at the current dose for now, he may need an increase  -Blood pressure range within the last 24 hours is 92-119 systolic, but is generally less than 115.   -Home dose of Coreg is 6.25 mg twice daily, he is currently on 3.125 mg twice daily. -If his blood pressure remains elevated, increase Coreg back to home dose tomorrow.  2.  Chronic systolic CHF/ischemic cardiomyopathy -Weighted office visit 10/24/2017 was 181 pounds, this appears to be his baseline. -He is currently 188 pounds, but volume status is good by exam.  This was a bed weight, not a standing weight. -Continue daily standing weights. -He was restarted on his home dose of Lasix 40 mg daily on 2/27. -Not on ACE/ARB/Entresto/Spironolactone due to poor renal function  Otherwise, per IM Principal Problem:   Nausea & vomiting Active Problems:   Atrial fibrillation (HCC)   UTI (urinary tract infection)   CKD (chronic kidney disease) stage 4, GFR 15-29 ml/min (HCC)   Renal insufficiency   Enterococcal bacteremia  For questions or updates, please contact [facility] Please consult [email or web address] for contact info under Cardiology/STEMI.   Signed, Rosaria Ferries, PA-C  12/11/2017 4:05 PM   I have seen, examined the patient, and reviewed the above assessment and plan.  Changes to above are made where necessary.  On exam, iRRR.  Quite ill appearing elderly male. Pt admitted with E faecalis bacteremia from urinary source.  Follow-up cultures are negative thus far and he is clinically without objective signs of ongoing infection.  Hopefully we can avoid ICD system extraction as he is elderly and frail and would likely not tolerate the procedure well. Short VT episode is noted,without symptoms.  He  has also had afib in the setting of his acute medical illness. Will ask St Jude  to interrogate ICD in am.  This will be helpful to determine Af burden compared to baseline and also to see if he has had sustained arrhythmias requiring ICD therapy since last interrogation.  Dr Caryl Comes to follow-up in AM.  I will defer any changes to AAD therapy to Dr Caryl Comes. Would keep K >3.9, Mg >1.9 Return coreg to home dose when able   Co Sign: Thompson Grayer, MD 12/11/2017 9:43 PM

## 2017-12-11 NOTE — Progress Notes (Signed)
Ambulated pt in the hall, 120 ft requiring 2-person assist.  Pt was weak and had some balance issues related to turning while making the return trip back to the room.  Oxygen saturations pre- ambulation were 97 % and post-ambulation were 96 %.  Will continue to work with patient encouraging ambulation again this afternoon.

## 2017-12-11 NOTE — Evaluation (Signed)
Physical Therapy Evaluation Patient Details Name: [name] MRN: [id number] DOB: Nov 19, 1931 Today's Date: 12/11/2017   History of Present Illness  84yo male presenting with nausea and vomiting, fever. Diagnosed with sepsis/enterococcus fecalis bacteremia. PMH A-fib, bladder CA, BPH, CAD, CHF, CRI, HTN, hypothyroidism, neuromuscular disorder, skin CA, lumbar surgery, cervical laminectomy, ICD   Clinical Impression   Patient received in bed, pleasant and willing to participate in skilled PT session today; he is able to complete functional bed mobility with min guard, functional transfers with MinA, and gait approximately 176ft with RW and min guard today. SpO2 remained in the 90s on room air throughout session. At return to room patient's condom cath slipped off and patient was transferred from sitting at EOB to being up in the chair, nursing assisted in replacing the catheter this afternoon. RN aware of patient status and mobility with PT. Moving forward patient will continue to benefit from skilled PT services in the acute setting, and will benefit from skilled HHPT services to further address functional deficits after discharge.     Follow Up Recommendations Home health PT    Equipment Recommendations  None recommended by PT(appears to have all necessary DME )    Recommendations for Other Services       Precautions / Restrictions Precautions Precautions: Fall;ICD/Pacemaker Restrictions Weight Bearing Restrictions: No      Mobility  Bed Mobility Overal bed mobility: Needs Assistance Bed Mobility: Supine to Sit     Supine to sit: Min guard     General bed mobility comments: extended time and effort   Transfers Overall transfer level: Needs assistance Equipment used: Rolling walker (2 wheeled) Transfers: Sit to/from Stand Sit to Stand: Min assist;Min guard         General transfer comment: can perform with Min guard and extended time/increased effort; benefits  from MinA for light boost, cues for safety and sequencing   Ambulation/Gait Ambulation/Gait assistance: Min guard Ambulation Distance (Feet): 160 Feet Assistive device: Rolling walker (2 wheeled) Gait Pattern/deviations: Step-through pattern;Drifts right/left     General Gait Details: able to ambulate at self-selected pace with RW, steady but does require min guard for safety; SpO2 at 92% following gait on room air   Stairs            Wheelchair Mobility    Modified Rankin (Stroke Patients Only)       Balance Overall balance assessment: Needs assistance Sitting-balance support: Bilateral upper extremity supported;Feet supported Sitting balance-Leahy Scale: Good     Standing balance support: Bilateral upper extremity supported;During functional activity Standing balance-Leahy Scale: Fair                               Pertinent Vitals/Pain Pain Assessment: Faces Faces Pain Scale: Hurts little more Pain Location: back and neck, "feels stiff"  Pain Descriptors / Indicators: Aching;Sore Pain Intervention(s): Limited activity within patient's tolerance;Monitored during session;Repositioned    Home Living Family/patient expects to be discharged to:: Private residence Living Arrangements: Spouse/significant other Available Help at Discharge: Family;Available 24 hours/day Type of Home: House Home Access: Stairs to enter Entrance Stairs-Rails: Right Entrance Stairs-Number of Steps: 4 Home Layout: Two level;Laundry or work area in [place]: Environmental consultant - 2 wheels;Cane - quad;Shower seat;Bedside commode      Prior Function Level of Independence: Needs assistance   Gait / Transfers Assistance Needed: walks without device  ADL's / Homemaking Assistance Needed: wife helps with socks and all  IADL  Comments: pt reports 2 falls in the last year     Hand Dominance        Extremity/Trunk Assessment        Lower Extremity Assessment Lower  Extremity Assessment: Generalized weakness    Cervical / Trunk Assessment Cervical / Trunk Assessment: Kyphotic  Communication   Communication: No difficulties  Cognition Arousal/Alertness: Awake/alert Behavior During Therapy: WFL for tasks assessed/performed Overall Cognitive Status: Within Functional Limits for tasks assessed                                        General Comments      Exercises     Assessment/Plan    PT Assessment Patient needs continued PT services  PT Problem List Decreased strength;Decreased mobility;Decreased safety awareness;Decreased coordination;Decreased balance       PT Treatment Interventions DME instruction;Therapeutic activities;Gait training;Therapeutic exercise;Patient/family education;Stair training;Balance training;Functional mobility training;Neuromuscular re-education    PT Goals (Current goals can be found in the Care Plan section)  Acute Rehab PT Goals Patient Stated Goal: to go home  PT Goal Formulation: With patient Time For Goal Achievement: 12/25/17 Potential to Achieve Goals: Good    Frequency Min 3X/week   Barriers to discharge        Co-evaluation               AM-PAC PT "6 Clicks" Daily Activity  Outcome Measure Difficulty turning over in bed (including adjusting bedclothes, sheets and blankets)?: Unable Difficulty moving from lying on back to sitting on the side of the bed? : Unable Difficulty sitting down on and standing up from a chair with arms (e.g., wheelchair, bedside commode, etc,.)?: Unable Help needed moving to and from a bed to chair (including a wheelchair)?: A Little Help needed walking in hospital room?: A Little Help needed climbing 3-5 steps with a railing? : A Lot 6 Click Score: 11    End of Session Equipment Utilized During Treatment: Gait belt Activity Tolerance: Patient tolerated treatment well Patient left: in chair;with call bell/phone within reach;with family/visitor  present Nurse Communication: Mobility status PT Visit Diagnosis: Unsteadiness on feet (R26.81);Muscle weakness (generalized) (M62.81);Difficulty in walking, not elsewhere classified (R26.2)    Time: 4627-0350 PT Time Calculation (min) (ACUTE ONLY): 40 min   Charges:   PT Evaluation $PT Eval Low Complexity: 1 Low PT Treatments $Gait Training: 8-22 mins $Therapeutic Activity: 8-22 mins   PT G Codes:        Deniece Ree PT, DPT, CBIS  Supplemental Physical Therapist [place]   Pager [phone]

## 2017-12-11 NOTE — Progress Notes (Signed)
PROGRESS NOTE    [name]  QIH:[id number] DOB: Oct 17, 1931 DOA: 12/06/2017 PCP: [name], MD  Brief Narrative: [name] with Hypothyroidism, Hypertension, CAD, P.Afib, Severe Cardiomyopathy EF 25%, COPD, OSA presented to ED with nausea, vomiting and fever CT unremarkable, UA with 6-30WBC and rare bacteria. FOund to have Enterococcal bacteremia and AKI on CKD4  Assessment & Plan:   Sepsis/Enterococcus fecalis bacteremia -likely urinary source, urine not highly suggestive of infection though,  unfortunately Urine Cx was not sent on admission and too late now -ID following, continue IV Daptomycin -Day 4 -repeat Blood CX 2/25 pending -2D ECHO with no vegetations, called Cards to request TEE-high referral volume will likely not be tomorrow -stop IVF -CT abd pelvis unremarkable  ID seen today and patient is s/p TEE. They have recommended the following:  Patient remains afebrile. Underwent TEE this morning that did not see any vegetation. I have seen and examined the patient. Agree with plan listed below:  Enterococcal bacteremia thought to be due to UTI  - plan for  2 wks of daptomycin at 8mg /kg, renally dosed - use 2/25 as day 1. - can place picc line today  As such they have signed off after above recommendations - Picc line inserted  Non sustained V tach - Pt on B blocker - consulted Cardiology - mg and phos wnl - K level wnl  Chronic systolic CHF/ischemic cardiomyopathy -Status post AICD, Echo with EF of 20%  -Continue aspirin, Coreg -stop IVF today -resume diuretics today: lasix 40 mg po daily  Paroxysmal atrial fibrillation -chads2vasc score>3, continue Coreg, amiodarone and low dose apixaban  AKI on CKD4 -Followed by Dr. Pearson Grippe with [facility] -stable, Baseline creatinine is 2.5-3 range -creatinine bumped to 3.4 -could be due to Vanc and Sepsis, abx changed to Daptomycin,  -creatinine 3.2 now which is close to his baseline, stop  IVF , monitor -I agree that he would be a very poor dialysis candidate when he gets to that stage  Hypertension -continue Coreg   COPD -Stable, not on home oxygen -continue nebs PRN  DVT prophylaxis: Eliquis Code Status: Full Code Family Communication: None at bedside Disposition Plan: Once cleared by cardiology given reports of unsustained v tach yesterday  Consultants:   Cardiology   Procedures:   Antimicrobials:  Antibiotics Given (last 72 hours)    Date/Time Action Medication Dose Rate   12/09/17 2132 New Bag/Given   DAPTOmycin (CUBICIN) 650 mg in sodium chloride 0.9 % IVPB 650 mg 226 mL/hr      Subjective: Pt reports no new complaints.   Objective: Vitals:   12/11/17 0017 12/11/17 0428 12/11/17 0742 12/11/17 1133  BP: 104/65 (!) 101/54 104/65   Pulse: (!) 118  81   Resp: (!) 22  (!) 23   Temp: 99.6 F (37.6 C) 99.5 F (37.5 C) 98.7 F (37.1 C) 98.8 F (37.1 C)  TempSrc: Oral Oral Oral Oral  SpO2: (!) 89%  94%   Weight:  85.6 kg (188 lb 11.4 oz)    Height:        Intake/Output Summary (Last 24 hours) at 12/11/2017 1348 Last data filed at 12/11/2017 1133 Gross per 24 hour  Intake 480 ml  Output 2700 ml  Net -2220 ml   Filed Weights   12/10/17 0240 12/10/17 1020 12/11/17 0428  Weight: 84.7 kg (186 lb 11.2 oz) 84.7 kg (186 lb 11.2 oz) 85.6 kg (188 lb 11.4 oz)    Examination:  Gen: Awake, Alert, Oriented X  3, no distress HEENT: PERRLA, Neck supple, + JVD Lungs: fine basilar crackles CVS: RRR,no Murmurs Abd: soft, Non tender, non distended, BS present Extremities: No Cyanosis, Clubbing or edema Skin: no new rashes, on limited exam. Psychiatry: Mood & affect appropriate.   Data Reviewed:   CBC: Recent Labs  Lab 12/06/17 1524 12/07/17 0234 12/08/17 0703 12/10/17 0230  WBC 11.4* 11.9* 13.8* 6.7  HGB 12.8* 10.9* 10.2* 9.6*  HCT 40.5 34.8* 32.9* 30.1*  MCV 93.8 93.3 93.7 93.8  PLT 178 160 130* 694*   Basic Metabolic Panel: Recent  Labs  Lab 12/07/17 0234 12/08/17 0703 12/09/17 0851 12/10/17 0230 12/10/17 1440 12/11/17 0914  NA 136 135 137 136  --  137  K 3.2* 3.9 3.6 3.8  --  3.6  CL 102 104 108 106  --  106  CO2 23 22 20* 18*  --  22  GLUCOSE 126* 121* 121* 110*  --  132*  BUN 34* 43* 45* 49*  --  48*  CREATININE 2.77* 3.49* 3.27* 3.23*  --  2.90*  CALCIUM 8.1* 8.1* 8.0* 8.1*  --  8.1*  MG  --   --   --   --  2.8*  --   PHOS  --   --   --   --  2.9  --    GFR: Estimated Creatinine Clearance: 19.6 mL/min (A) (by C-G formula based on SCr of 2.9 mg/dL (H)). Liver Function Tests: Recent Labs  Lab 12/06/17 2049 12/07/17 0234  AST 53* 30  ALT 16* 14*  ALKPHOS 57 50  BILITOT 0.6 0.8  PROT 6.6 5.9*  ALBUMIN 3.3* 3.0*   Recent Labs  Lab 12/06/17 2049  LIPASE 28   No results for input(s): AMMONIA in the last 168 hours. Coagulation Profile: Recent Labs  Lab 12/06/17 2130  INR 1.17   Cardiac Enzymes: Recent Labs  Lab 12/07/17 1305 12/10/17 0230  CKTOTAL 338 161   BNP (last 3 results) No results for input(s): PROBNP in the last 8760 hours. HbA1C: No results for input(s): HGBA1C in the last 72 hours. CBG: Recent Labs  Lab 12/06/17 1531  GLUCAP 121*   Lipid Profile: No results for input(s): CHOL, HDL, LDLCALC, TRIG, CHOLHDL, LDLDIRECT in the last 72 hours. Thyroid Function Tests: No results for input(s): TSH, T4TOTAL, FREET4, T3FREE, THYROIDAB in the last 72 hours. Anemia Panel: No results for input(s): VITAMINB12, FOLATE, FERRITIN, TIBC, IRON, RETICCTPCT in the last 72 hours. Urine analysis:    Component Value Date/Time   COLORURINE YELLOW 12/06/2017 [place] 12/06/2017 1524   LABSPEC 1.010 12/06/2017 1524   PHURINE 5.0 12/06/2017 1524   GLUCOSEU NEGATIVE 12/06/2017 1524   HGBUR MODERATE (A) 12/06/2017 1524   BILIRUBINUR NEGATIVE 12/06/2017 1524   KETONESUR NEGATIVE 12/06/2017 1524   PROTEINUR NEGATIVE 12/06/2017 1524   UROBILINOGEN 0.2 08/15/2013 1132    NITRITE NEGATIVE 12/06/2017 1524   LEUKOCYTESUR NEGATIVE 12/06/2017 1524   Sepsis Labs: @LABRCNTIP (procalcitonin:4,lacticidven:4)  ) Recent Results (from the past 240 hour(s))  Culture, blood (routine x 2)     Status: None (Preliminary result)   Collection Time: 12/06/17  3:40 PM  Result Value Ref Range Status   Specimen Description BLOOD LEFT ANTECUBITAL  Final   Special Requests   Final    BOTTLES DRAWN AEROBIC AND ANAEROBIC Blood Culture adequate volume   Culture   Final    NO GROWTH 4 DAYS Performed at [facility], [place] [address] [place], [place]  [address]    Report Status PENDING  Incomplete  Culture, blood (routine x 2)     Status: Abnormal   Collection Time: 12/06/17  4:35 PM  Result Value Ref Range Status   Specimen Description BLOOD RIGHT ANTECUBITAL  Final   Special Requests   Final    BOTTLES DRAWN AEROBIC AND ANAEROBIC Blood Culture adequate volume   Culture  Setup Time   Final    GRAM POSITIVE COCCI IN PAIRS IN CHAINS IN BOTH AEROBIC AND ANAEROBIC BOTTLES CRITICAL RESULT CALLED TO, READ BACK BY AND VERIFIED WITH: B MANCHERIL,PHARMD AT 1003 12/07/17 BY L BENFIELD Performed at [facility], [place] [address] [place], [place] [address]    Culture ENTEROCOCCUS FAECALIS (A)  Final   Report Status 12/09/2017 FINAL  Final   Organism ID, Bacteria ENTEROCOCCUS FAECALIS  Final      Susceptibility   Enterococcus faecalis - MIC*    AMPICILLIN <=2 SENSITIVE Sensitive     VANCOMYCIN 1 SENSITIVE Sensitive     GENTAMICIN SYNERGY SENSITIVE Sensitive     * ENTEROCOCCUS FAECALIS  Blood Culture ID Panel (Reflexed)     Status: Abnormal   Collection Time: 12/06/17  4:35 PM  Result Value Ref Range Status   Enterococcus species DETECTED (A) NOT DETECTED Final    Comment: CRITICAL RESULT CALLED TO, READ BACK BY AND VERIFIED WITH: B MANCHERIL,PHARMD AT 1003 12/07/17 BY L BENFIELD    Vancomycin resistance NOT DETECTED NOT DETECTED Final   Listeria monocytogenes NOT  DETECTED NOT DETECTED Final   Staphylococcus species NOT DETECTED NOT DETECTED Final   Staphylococcus aureus NOT DETECTED NOT DETECTED Final   Streptococcus species NOT DETECTED NOT DETECTED Final   Streptococcus agalactiae NOT DETECTED NOT DETECTED Final   Streptococcus pneumoniae NOT DETECTED NOT DETECTED Final   Streptococcus pyogenes NOT DETECTED NOT DETECTED Final   Acinetobacter baumannii NOT DETECTED NOT DETECTED Final   Enterobacteriaceae species NOT DETECTED NOT DETECTED Final   Enterobacter cloacae complex NOT DETECTED NOT DETECTED Final   Escherichia coli NOT DETECTED NOT DETECTED Final   Klebsiella oxytoca NOT DETECTED NOT DETECTED Final   Klebsiella pneumoniae NOT DETECTED NOT DETECTED Final   Proteus species NOT DETECTED NOT DETECTED Final   Serratia marcescens NOT DETECTED NOT DETECTED Final   Haemophilus influenzae NOT DETECTED NOT DETECTED Final   Neisseria meningitidis NOT DETECTED NOT DETECTED Final   Pseudomonas aeruginosa NOT DETECTED NOT DETECTED Final   Candida albicans NOT DETECTED NOT DETECTED Final   Candida glabrata NOT DETECTED NOT DETECTED Final   Candida krusei NOT DETECTED NOT DETECTED Final   Candida parapsilosis NOT DETECTED NOT DETECTED Final   Candida tropicalis NOT DETECTED NOT DETECTED Final    Comment: Performed at [facility], [place] [address], [place], [place] [address]  Culture, blood (routine x 2)     Status: None (Preliminary result)   Collection Time: 12/08/17  7:20 AM  Result Value Ref Range Status   Specimen Description BLOOD RIGHT HAND  Final   Special Requests   Final    BOTTLES DRAWN AEROBIC AND ANAEROBIC Blood Culture adequate volume   Culture   Final    NO GROWTH 2 DAYS Performed at [facility], [place] [address] [place], [place] [address]    Report Status PENDING  Incomplete  Culture, blood (routine x 2)     Status: None (Preliminary result)   Collection Time: 12/08/17  7:37 AM  Result Value Ref Range Status  Specimen Description BLOOD RIGHT ANTECUBITAL  Final   Special Requests   Final    BOTTLES DRAWN AEROBIC AND ANAEROBIC Blood Culture results may not be optimal due to an excessive volume of blood received in culture bottles   Culture   Final    NO GROWTH 2 DAYS Performed at [facility], [place] [address], [place], [place] [address]    Report Status PENDING  Incomplete     Radiology Studies: [place] Ekg Site Rite  Result Date: 12/10/2017 If Site Rite image not attached, placement could not be confirmed due to current cardiac rhythm.   Scheduled Meds: . amiodarone  200 mg Oral BID  . apixaban  2.5 mg Oral BID  . carvedilol  3.125 mg Oral BID WC  . ezetimibe  10 mg Oral Daily  . ferrous sulfate  325 mg Oral Q supper  . furosemide  40 mg Oral Daily  . levothyroxine  25 mcg Oral Once per day on Mon Wed Fri  . levothyroxine  50 mcg Oral Once per day on Sun Tue Thu Sat  . multivitamin with minerals  1 tablet Oral QHS  . pantoprazole  40 mg Oral Daily  . potassium chloride  10 mEq Oral Daily  . rosuvastatin  10 mg Oral QHS  . sodium chloride flush  10-40 mL Intracatheter Q12H  . sodium chloride flush  3 mL Intravenous Q12H  . tamsulosin  0.4 mg Oral QHS   Continuous Infusions: . sodium chloride    . DAPTOmycin (CUBICIN)  IV Stopped (12/09/17 2202)     LOS: 5 days    Time spent: 7min  Stormie Ventola [facility] Page via [email or web address], password [email or web address] After 7PM please contact night-coverage  12/11/2017, 1:48 PM

## 2017-12-12 LAB — MAGNESIUM: MAGNESIUM: 2.4 mg/dL (ref 1.7–2.4)

## 2017-12-12 LAB — BASIC METABOLIC PANEL
ANION GAP: 12 (ref 5–15)
BUN: 46 mg/dL — ABNORMAL HIGH (ref 6–20)
CHLORIDE: 104 mmol/L (ref 101–111)
CO2: 24 mmol/L (ref 22–32)
CREATININE: 2.92 mg/dL — AB (ref 0.61–1.24)
Calcium: 8.5 mg/dL — ABNORMAL LOW (ref 8.9–10.3)
GFR calc non Af Amer: 18 mL/min — ABNORMAL LOW (ref >60)
GFR, EST AFRICAN AMERICAN: 21 mL/min — AB (ref >60)
Glucose, Bld: 120 mg/dL — ABNORMAL HIGH (ref 65–99)
POTASSIUM: 3.6 mmol/L (ref 3.5–5.1)
SODIUM: 140 mmol/L (ref 135–145)

## 2017-12-12 MED ORDER — AMIODARONE HCL 200 MG PO TABS: 200.0000 mg | ORAL_TABLET | Freq: Two times a day (BID) | ORAL | 0 refills | Status: DC

## 2017-12-12 MED ORDER — HEPARIN SOD (PORK) LOCK FLUSH 100 UNIT/ML IV SOLN
250.0000 [IU] | INTRAVENOUS | Status: AC | PRN
  Administered 2017-12-12: 250 [IU]

## 2017-12-12 MED ORDER — ALBUTEROL SULFATE (2.5 MG/3ML) 0.083% IN NEBU
2.5000 mg | INHALATION_SOLUTION | Freq: Four times a day (QID) | RESPIRATORY_TRACT | Status: DC | PRN
  Administered 2017-12-12: 2.5 mg via RESPIRATORY_TRACT
  Filled 2017-12-12: qty 3

## 2017-12-12 MED ORDER — ROSUVASTATIN CALCIUM 10 MG PO TABS: 10.0000 mg | ORAL_TABLET | Freq: Every day | ORAL | Status: DC

## 2017-12-12 MED ORDER — DAPTOMYCIN IV (FOR PTA / DISCHARGE USE ONLY): 650.0000 mg | INTRAVENOUS | 0 refills | Status: AC

## 2017-12-12 MED ORDER — POTASSIUM CHLORIDE CRYS ER 10 MEQ PO TBCR: 10.0000 meq | EXTENDED_RELEASE_TABLET | Freq: Every day | ORAL | 0 refills | Status: DC

## 2017-12-12 NOTE — Consult Note (Addendum)
   [facility] CM Inpatient Consult   12/12/2017  [name] 01/31/1932 [id number]  Patient assessed for high risk in the [facility] Medicare ACO.  Chart review reveals the patient is 84 years old and is to discharge home with home health care IV antibiotic therapy with [facility]. Came by to speak with inpatient RNCM regarding post hospital needs.  Patient had lots of visitors at this time. Will follow up as time permits.  Patient's primary care provider: Dr. [name]  Natividad Brood, RN BSN [facility] Liaison  [phone] business mobile phone Toll free office [phone]

## 2017-12-12 NOTE — Progress Notes (Signed)
Spoke with Jeannene Patella and Butch Penny at [facility] and IV Abx arranged for discharge. No other CM needs.

## 2017-12-12 NOTE — Progress Notes (Signed)
Pt presented with new onset of expiratory wheezing.  Wheezing is worse upon reclining and is less when sitting.  Contacted Dr. Wendee Beavers via text page and phone call, verbal order received for PRN albuterol 2.5 mg q6h.

## 2017-12-12 NOTE — Progress Notes (Signed)
Physical Therapy Treatment Patient Details Name: [name] MRN: [id number] DOB: 05-Dec-1931 Today's Date: 12/12/2017    History of Present Illness 84yo male presenting with nausea and vomiting, fever. Diagnosed with sepsis/enterococcus fecalis bacteremia. Likey to be UTI source. PMH A-fib, bladder CA, BPH, CAD, CHF, CRI, HTN, hypothyroidism, neuromuscular disorder, skin CA, lumbar surgery, cervical laminectomy, ICD     PT Comments    Pt making good progress. Eager to go home.   Follow Up Recommendations  Home health PT     Equipment Recommendations  None recommended by PT(appears to have all necessary DME )    Recommendations for Other Services       Precautions / Restrictions Precautions Precautions: Fall Restrictions Weight Bearing Restrictions: No    Mobility  Bed Mobility Overal bed mobility: Needs Assistance Bed Mobility: Supine to Sit     Supine to sit: Min guard;HOB elevated     General bed mobility comments: extended time and effort   Transfers Overall transfer level: Needs assistance Equipment used: Rolling walker (2 wheeled) Transfers: Sit to/from Stand Sit to Stand: Min guard         General transfer comment: Incr time and effort  Ambulation/Gait Ambulation/Gait assistance: Min guard;Supervision Ambulation Distance (Feet): 500 Feet Assistive device: Rolling walker (2 wheeled) Gait Pattern/deviations: Step-through pattern;Drifts right/left;Decreased stride length;Trunk flexed Gait velocity: decr Gait velocity interpretation: Below normal speed for age/gender General Gait Details: Assist for safety. Incr time. Amb on RA with SpO2 95% after amb   Stairs            Wheelchair Mobility    Modified Rankin (Stroke Patients Only)       Balance Overall balance assessment: Needs assistance Sitting-balance support: Bilateral upper extremity supported;Feet supported Sitting balance-Leahy Scale: Good     Standing balance support:  During functional activity;No upper extremity supported Standing balance-Leahy Scale: Fair                              Cognition Arousal/Alertness: Awake/alert Behavior During Therapy: WFL for tasks assessed/performed Overall Cognitive Status: Within Functional Limits for tasks assessed                                        Exercises      General Comments        Pertinent Vitals/Pain Pain Assessment: No/denies pain    Home Living                      Prior Function            PT Goals (current goals can now be found in the care plan section) Progress towards PT goals: Progressing toward goals    Frequency    Min 3X/week      PT Plan Current plan remains appropriate    Co-evaluation              AM-PAC PT "6 Clicks" Daily Activity  Outcome Measure  Difficulty turning over in bed (including adjusting bedclothes, sheets and blankets)?: Unable Difficulty moving from lying on back to sitting on the side of the bed? : Unable Difficulty sitting down on and standing up from a chair with arms (e.g., wheelchair, bedside commode, etc,.)?: Unable Help needed moving to and from a bed to chair (including a wheelchair)?: A Little Help needed walking in  hospital room?: A Little Help needed climbing 3-5 steps with a railing? : A Lot 6 Click Score: 11    End of Session Equipment Utilized During Treatment: Gait belt Activity Tolerance: Patient tolerated treatment well Patient left: with call bell/phone within reach;in bed;with nursing/sitter in room Nurse Communication: Mobility status PT Visit Diagnosis: Unsteadiness on feet (R26.81);Muscle weakness (generalized) (M62.81);Difficulty in walking, not elsewhere classified (R26.2)     Time: 7939-0300 PT Time Calculation (min) (ACUTE ONLY): 20 min  Charges:  $Gait Training: 8-22 mins                    G Codes:       [facility] PT [place] 12/12/2017,  1:54 PM

## 2017-12-12 NOTE — Discharge Summary (Signed)
Physician Discharge Summary  [name] RNH:[id number] DOB: 1932-03-05 DOA: 12/06/2017  PCP: [name] Baton, MD  Admit date: 12/06/2017 Discharge date: 12/12/2017  Time spent: > 35 minutes  Recommendations for Outpatient Follow-up:  1. Please ensure patient finishes antibiotics as recommended 2. Please see d/c instructions below regarding lab tests   Discharge Diagnoses:  Principal Problem:   Nausea & vomiting Active Problems:   Atrial fibrillation (HCC)   UTI (urinary tract infection)   CKD (chronic kidney disease) stage 4, GFR 15-29 ml/min (HCC)   Renal insufficiency   Enterococcal bacteremia   Discharge Condition: stable  Diet recommendation: heart healthy  Filed Weights   12/10/17 1020 12/11/17 0428 12/12/17 0449  Weight: 84.7 kg (186 lb 11.2 oz) 85.6 kg (188 lb 11.4 oz) 85.8 kg (189 lb 2.5 oz)    History of present illness:  [name] with Hypothyroidism, Hypertension, CAD, P.Afib, Severe Cardiomyopathy EF 25%, COPD, OSA presented to ED with nausea, vomiting and fever CT unremarkable, UA with 6-30WBC and rare bacteria. Found to have Enterococcal bacteremia and AKI on [facility] Course:  Sepsis/Enterococcus fecalis bacteremia -likely urinary source, urine not highly suggestive of infection though,  unfortunately Urine Cx was not sent on admission and too late now -ID following, continue IV Daptomycin -Day 4 -repeat Blood CX 2/25 pending -2D ECHO with no vegetations, called Cards to request TEE-high referral volume will likely not be tomorrow -stop IVF -CT abd pelvis unremarkable  ID seen today and patient is s/p TEE. They have recommended the following:  Patient remains afebrile. Underwent TEE this morning that did not see any vegetation. I have seen and examined the patient. Agree with plan listed below:  Enterococcal bacteremia thought to be due to UTI  - plan for 2 wks of daptomycin at 105m/kg, renally dosed - use 2/25 as day 1. - can place picc line  today  End date reported as 12/21/17  Non sustained V tach - Pt on B blocker - cleared for d/c by electrophysiologist after evaluation  Chronic systolic CHF/ischemic cardiomyopathy - Status post AICD, Echo with EF of 20%  - Continue aspirin, Coreg - resume home diuretic regimen: lasix 40 mg po daily - add K replacement given that patient will be on lasix  Paroxysmal atrial fibrillation -chads2vasc score>3, continue Coreg, amiodarone and low dose apixaban  AKI on CKD4 -Followed by Dr. RPearson Grippewith [facility] [name] -stable, Baseline creatinine is 2.5-3range -currently at baseline on last check 2.9 serum creatinine  Hypertension -continue Coreg   COPD -Stable continue prior to admission medication regimen.  Procedures:  picc line insertion  Consultations:  Cardiology  ID  Discharge Exam: Vitals:   12/12/17 1312 12/12/17 1512  BP:    Pulse:  63  Resp:    Temp:    SpO2: 95% 95%    General: Pt in nad, alert and awake Cardiovascular: rrr, no rubs Respiratory: no increased wob, no wheezes  Discharge Instructions   Discharge Instructions    Diet - low sodium heart healthy   Complete by:  As directed    Discharge instructions   Complete by:  As directed    Pt to discharge home on daptomycin to finish treatment on 11/23/17.  Please ensure that patient has once weekly labs drawn: cbc, bmp, CKP And every other week ESR and CRP   Home infusion instructions [facility] May follow AStartexDosing Protocol; May administer Cathflo as needed to maintain patency of vascular access device.; Flushing of  vascular access device: per [facility] Protocol: 0.9% NaCl pre/post medica...   Complete by:  As directed    Instructions:  May follow [place] Dosing Protocol   Instructions:  May administer Cathflo as needed to maintain patency of vascular access device.   Instructions:  Flushing of vascular access device: per [facility] Protocol: 0.9% NaCl pre/post  medication administration and prn patency; Heparin 100 u/ml, 41m for implanted ports and Heparin 10u/ml, 573mfor all other central venous catheters.   Instructions:  May follow [facility] Anaphylaxis Protocol for First Dose Administration in the home: 0.9% NaCl at 25-50 ml/hr to maintain IV access for protocol meds. Epinephrine 0.3 ml IV/IM PRN and Benadryl 25-50 IV/IM PRN s/s of anaphylaxis.   Instructions:  [facility] Coordinator (RN) to assist per patient IV care needs in the home PRN.   Increase activity slowly   Complete by:  As directed       Allergies  Allergen Reactions  . Ramipril Other (See Comments)    cough  . Penicillins Itching and Rash    Has patient had a PCN reaction causing immediate rash, facial/tongue/throat swelling, SOB or lightheadedness with hypotension: No Has patient had a PCN reaction causing severe rash involving mucus membranes or skin necrosis: Yes-back and hands. Has patient had a PCN reaction that required hospitalization: No Has patient had a PCN reaction occurring within the last 10 years: Yes If all of the above answers are "NO", then may proceed with Cephalosporin use.      The results of significant diagnostics from this hospitalization (including imaging, microbiology, ancillary and laboratory) are listed below for reference.    Significant Diagnostic Studies: Ct Abdomen Pelvis Wo Contrast  Result Date: 12/07/2017 CLINICAL DATA:  [age]ear old male with abdominal pain. EXAM: CT ABDOMEN AND PELVIS WITHOUT CONTRAST TECHNIQUE: Multidetector CT imaging of the abdomen and pelvis was performed following the standard protocol without IV contrast. COMPARISON:  Abdominal CT dated 07/28/2017 FINDINGS: Evaluation of this exam is limited in the absence of intravenous contrast. Lower chest: There are bibasilar linear atelectasis/scarring. There is mild cardiomegaly. Coronary vascular calcifications and pacemaker wires noted There is hypoattenuation of the  cardiac blood pool suggestive of a degree of anemia. Clinical correlation is recommended. No intra-abdominal free air or free fluid. Hepatobiliary: No focal liver abnormality is seen. No gallstones, gallbladder wall thickening, or biliary dilatation. Pancreas: Unremarkable. No pancreatic ductal dilatation or surrounding inflammatory changes. Spleen: Normal in size without focal abnormality. Adrenals/Urinary Tract: The adrenal glands are unremarkable. Mild bilateral renal parenchyma atrophy there is no hydronephrosis or nephrolithiasis on either side. The visualized ureters appear unremarkable. There is trabeculated appearance of the bladder wall likely related to chronic bladder outlet obstruction or inflammation Stomach/Bowel: There is extensive sigmoid diverticulosis without active inflammatory changes. There is a moderate size hiatal hernia. No bowel obstruction or active inflammation. Normal appendix. Vascular/Lymphatic: There is advanced aortoiliac atherosclerotic disease. There is a 2.7 cm infrarenal aortic ectasia. Stable distal aortic calcified intimal flap or mural thrombus. The IVC is unremarkable on this noncontrast CT. No portal venous gas. There is no adenopathy. Reproductive: The prostate gland is grossly unremarkable. Other: None Musculoskeletal: Degenerative changes of the spine and osteopenia. No acute osseous pathology. IMPRESSION: 1. No acute intra-abdominal or pelvic pathology 2. Sigmoid diverticulosis. No bowel obstruction or active inflammation. Normal appendix. 3. Moderate size hiatal hernia similar to prior CT. 4. A 2.7 cm infrarenal aortic ectasia similar to prior CT Ectatic abdominal aorta at risk for aneurysm development.  Recommend followup by ultrasound in 5 years. This recommendation follows ACR consensus guidelines: White Paper of the ACR Incidental Findings Committee II on Vascular Findings. J Am Coll Radiol 2013; 95:188-416. 5.  Aortic Atherosclerosis (ICD10-I70.0). Electronically  Signed   By: Anner Crete M.D.   On: 12/07/2017 04:03   Dg Abdomen Acute W/chest  Result Date: 12/06/2017 CLINICAL DATA:  Weakness.  Vomiting. EXAM: DG ABDOMEN ACUTE W/ 1V CHEST COMPARISON:  None. FINDINGS: There is a nodular density in the lateral right lung, unchanged since 2014. Linear calcification in the upper right chest is also unchanged since 2014. No new nodules or masses. Stable AICD device. The heart, hila, mediastinum are unremarkable. No other acute abnormalities in the chest. No free air or free fluid. There is a paucity of bowel gas but no evidence of obstruction. No renal stones identified. No other acute abnormalities. IMPRESSION: Paucity of bowel gas limits evaluation. Within this limitation, no abnormalities are identified. Electronically Signed   By: Dorise Bullion III M.D   On: 12/06/2017 16:37   [place] Ekg Site Rite  Result Date: 12/10/2017 If Site Rite image not attached, placement could not be confirmed due to current cardiac rhythm.   Microbiology: Recent Results (from the past 240 hour(s))  Culture, blood (routine x 2)     Status: None   Collection Time: 12/06/17  3:40 PM  Result Value Ref Range Status   Specimen Description BLOOD LEFT ANTECUBITAL  Final   Special Requests   Final    BOTTLES DRAWN AEROBIC AND ANAEROBIC Blood Culture adequate volume   Culture   Final    NO GROWTH 5 DAYS Performed at [facility], [address] [place], [place] [address]    Report Status 12/11/2017 FINAL  Final  Culture, blood (routine x 2)     Status: Abnormal   Collection Time: 12/06/17  4:35 PM  Result Value Ref Range Status   Specimen Description BLOOD RIGHT ANTECUBITAL  Final   Special Requests   Final    BOTTLES DRAWN AEROBIC AND ANAEROBIC Blood Culture adequate volume   Culture  Setup Time   Final    GRAM POSITIVE COCCI IN PAIRS IN CHAINS IN BOTH AEROBIC AND ANAEROBIC BOTTLES CRITICAL RESULT CALLED TO, READ BACK BY AND VERIFIED WITH: B MANCHERIL,PHARMD AT 1003  12/07/17 BY L BENFIELD Performed at [facility], [place] [address] [place], [place] [address]    Culture ENTEROCOCCUS FAECALIS (A)  Final   Report Status 12/09/2017 FINAL  Final   Organism ID, Bacteria ENTEROCOCCUS FAECALIS  Final      Susceptibility   Enterococcus faecalis - MIC*    AMPICILLIN <=2 SENSITIVE Sensitive     VANCOMYCIN 1 SENSITIVE Sensitive     GENTAMICIN SYNERGY SENSITIVE Sensitive     * ENTEROCOCCUS FAECALIS  Blood Culture ID Panel (Reflexed)     Status: Abnormal   Collection Time: 12/06/17  4:35 PM  Result Value Ref Range Status   Enterococcus species DETECTED (A) NOT DETECTED Final    Comment: CRITICAL RESULT CALLED TO, READ BACK BY AND VERIFIED WITH: B MANCHERIL,PHARMD AT 1003 12/07/17 BY L BENFIELD    Vancomycin resistance NOT DETECTED NOT DETECTED Final   Listeria monocytogenes NOT DETECTED NOT DETECTED Final   Staphylococcus species NOT DETECTED NOT DETECTED Final   Staphylococcus aureus NOT DETECTED NOT DETECTED Final   Streptococcus species NOT DETECTED NOT DETECTED Final   Streptococcus agalactiae NOT DETECTED NOT DETECTED Final   Streptococcus pneumoniae NOT  DETECTED NOT DETECTED Final   Streptococcus pyogenes NOT DETECTED NOT DETECTED Final   Acinetobacter baumannii NOT DETECTED NOT DETECTED Final   Enterobacteriaceae species NOT DETECTED NOT DETECTED Final   Enterobacter cloacae complex NOT DETECTED NOT DETECTED Final   Escherichia coli NOT DETECTED NOT DETECTED Final   Klebsiella oxytoca NOT DETECTED NOT DETECTED Final   Klebsiella pneumoniae NOT DETECTED NOT DETECTED Final   Proteus species NOT DETECTED NOT DETECTED Final   Serratia marcescens NOT DETECTED NOT DETECTED Final   Haemophilus influenzae NOT DETECTED NOT DETECTED Final   Neisseria meningitidis NOT DETECTED NOT DETECTED Final   Pseudomonas aeruginosa NOT DETECTED NOT DETECTED Final   Candida albicans NOT DETECTED NOT DETECTED Final   Candida glabrata NOT DETECTED NOT DETECTED Final    Candida krusei NOT DETECTED NOT DETECTED Final   Candida parapsilosis NOT DETECTED NOT DETECTED Final   Candida tropicalis NOT DETECTED NOT DETECTED Final    Comment: Performed at [facility], [place] [address] [place], [place] [address]  Culture, blood (routine x 2)     Status: None (Preliminary result)   Collection Time: 12/08/17  7:20 AM  Result Value Ref Range Status   Specimen Description BLOOD RIGHT HAND  Final   Special Requests   Final    BOTTLES DRAWN AEROBIC AND ANAEROBIC Blood Culture adequate volume   Culture   Final    NO GROWTH 4 DAYS Performed at [facility], [place] [address] [place], [place] [address]    Report Status PENDING  Incomplete  Culture, blood (routine x 2)     Status: None (Preliminary result)   Collection Time: 12/08/17  7:37 AM  Result Value Ref Range Status   Specimen Description BLOOD RIGHT ANTECUBITAL  Final   Special Requests   Final    BOTTLES DRAWN AEROBIC AND ANAEROBIC Blood Culture results may not be optimal due to an excessive volume of blood received in culture bottles   Culture   Final    NO GROWTH 4 DAYS Performed at [facility], [place] [address] [place],  [address]    Report Status PENDING  Incomplete     Labs: Basic Metabolic Panel: Recent Labs  Lab 12/08/17 0703 12/09/17 0851 12/10/17 0230 12/10/17 1440 12/11/17 0914 12/12/17 0304  NA 135 137 136  --  137 140  K 3.9 3.6 3.8  --  3.6 3.6  CL 104 108 106  --  106 104  CO2 22 20* 18*  --  22 24  GLUCOSE 121* 121* 110*  --  132* 120*  BUN 43* 45* 49*  --  48* 46*  CREATININE 3.49* 3.27* 3.23*  --  2.90* 2.92*  CALCIUM 8.1* 8.0* 8.1*  --  8.1* 8.5*  MG  --   --   --  2.8*  --  2.4  PHOS  --   --   --  2.9  --   --    Liver Function Tests: Recent Labs  Lab 12/06/17 2049 12/07/17 0234  AST 53* 30  ALT 16* 14*  ALKPHOS 57 50  BILITOT 0.6 0.8  PROT 6.6 5.9*  ALBUMIN 3.3* 3.0*   Recent Labs  Lab 12/06/17 2049  LIPASE 28   No results for  input(s): AMMONIA in the last 168 hours. CBC: Recent Labs  Lab 12/06/17 1524 12/07/17 0234 12/08/17 0703 12/10/17 0230  WBC 11.4* 11.9* 13.8* 6.7  HGB 12.8* 10.9* 10.2* 9.6*  HCT 40.5 34.8* 32.9* 30.1*  MCV 93.8 93.3 93.7 93.8  PLT 178 160 130* 127*   Cardiac Enzymes: Recent Labs  Lab 12/07/17 1305 12/10/17 0230  CKTOTAL 338 161   BNP: BNP (last 3 results) Recent Labs    10/12/17 1847 12/06/17 2130  BNP 626.2* 400.9*    ProBNP (last 3 results) No results for input(s): PROBNP in the last 8760 hours.  CBG: Recent Labs  Lab 12/06/17 1531  GLUCAP 121*    Signed:  Velvet Bathe MD.  [facility] 12/12/2017, 3:28 PM

## 2017-12-12 NOTE — Progress Notes (Addendum)
Electrophysiology Rounding Note  Patient Name: [name] Date of Encounter: 12/12/2017  Primary Cardiologist: Echo Electrophysiologist: Caryl Comes   Subjective   The patient is doing well today.  At this time, the patient denies chest pain, shortness of breath, or any new concerns. He really wants to go home.   Inpatient Medications    Scheduled Meds: . amiodarone  200 mg Oral BID  . apixaban  2.5 mg Oral BID  . carvedilol  3.125 mg Oral BID WC  . ezetimibe  10 mg Oral Daily  . ferrous sulfate  325 mg Oral Q supper  . furosemide  40 mg Oral Daily  . levothyroxine  25 mcg Oral Once per day on Mon Wed Fri  . levothyroxine  50 mcg Oral Once per day on Sun Tue Thu Sat  . multivitamin with minerals  1 tablet Oral QHS  . pantoprazole  40 mg Oral Daily  . potassium chloride  10 mEq Oral Daily  . rosuvastatin  10 mg Oral QHS  . sodium chloride flush  10-40 mL Intracatheter Q12H  . sodium chloride flush  3 mL Intravenous Q12H  . tamsulosin  0.4 mg Oral QHS   Continuous Infusions: . sodium chloride    . DAPTOmycin (CUBICIN)  IV Stopped (12/11/17 1800)   PRN Meds: sodium chloride, acetaminophen **OR** acetaminophen, diphenhydrAMINE **AND** acetaminophen, ondansetron (ZOFRAN) IV, promethazine, sodium chloride, sodium chloride flush, sodium chloride flush   Vital Signs    Vitals:   12/11/17 1626 12/11/17 2035 12/12/17 0021 12/12/17 0449  BP: 109/72 101/69 98/75 (!) 103/47  Pulse: (!) 118 92  70  Resp: (!) 22 (!) 25 (!) 22 (!) 24  Temp: 98.2 F (36.8 C) 99.5 F (37.5 C) 98.9 F (37.2 C) 99.3 F (37.4 C)  TempSrc: Oral Oral Oral Oral  SpO2: 96% 95%  94%  Weight:    189 lb 2.5 oz (85.8 kg)  Height:        Intake/Output Summary (Last 24 hours) at 12/12/2017 0729 Last data filed at 12/12/2017 0456 Gross per 24 hour  Intake 720 ml  Output 2251 ml  Net -1531 ml   Filed Weights   12/10/17 1020 12/11/17 0428 12/12/17 0449  Weight: 186 lb 11.2 oz (84.7 kg) 188 lb  11.4 oz (85.6 kg) 189 lb 2.5 oz (85.8 kg)    Physical Exam    GEN- The patient is elderly appearing, alert and oriented x 3 today.  Looks weak  Head- normocephalic, atraumatic Eyes-  Sclera clear, conjunctiva pink Ears- hearing intact Oropharynx- clear Neck- supple Lungs- some wheezing and ronchi Heart- Regular rate and rhythm (paced) GI- soft, NT, ND, + BS Extremities- no clubbing, cyanosis, or edema Skin- no rash or lesion Psych- euthymic mood, full affect Neuro- strength and sensation are intact  Labs    CBC Recent Labs    12/10/17 0230  WBC 6.7  HGB 9.6*  HCT 30.1*  MCV 93.8  PLT 734*   Basic Metabolic Panel Recent Labs    12/10/17 1440 12/11/17 0914 12/12/17 0304  NA  --  137 140  K  --  3.6 3.6  CL  --  106 104  CO2  --  22 24  GLUCOSE  --  132* 120*  BUN  --  48* 46*  CREATININE  --  2.90* 2.92*  CALCIUM  --  8.1* 8.5*  MG 2.8*  --  2.4  PHOS 2.9  --   --    Cardiac Enzymes  Recent Labs    12/10/17 0230  CKTOTAL 161    Telemetry    AV pacing (personally reviewed)  Radiology    [place] Ekg Site Rite  Result Date: 12/10/2017 If Site Rite image not attached, placement could not be confirmed due to current cardiac rhythm.   Assessment & Plan    1.  NSVT No recurrence since yesterday No change in therapy ICD interrogation pending  2.  Paroxysmal atrial fibrillation Continue OAC long term for CHADS2VASC of 4  3.  Bacteremia/urosepsis Follow up cultures negative No objective evidence of ongoing infection Antibiotics per primary team  He would be a poor candidate for device system extraction   Ok to discharge from our standpoint Electrophysiology team to see as needed while here. Please call with questions.  Signed, Chanetta Marshall, NP  12/12/2017, 7:29 AM   No interval tachycardia  From arrhythmia point of view can be discharged,   Will followup as previosuly scheduled

## 2017-12-13 DIAGNOSIS — I5043 Acute on chronic combined systolic (congestive) and diastolic (congestive) heart failure: Secondary | ICD-10-CM | POA: Diagnosis not present

## 2017-12-13 DIAGNOSIS — I2510 Atherosclerotic heart disease of native coronary artery without angina pectoris: Secondary | ICD-10-CM | POA: Diagnosis not present

## 2017-12-13 DIAGNOSIS — I130 Hypertensive heart and chronic kidney disease with heart failure and stage 1 through stage 4 chronic kidney disease, or unspecified chronic kidney disease: Secondary | ICD-10-CM | POA: Diagnosis not present

## 2017-12-13 DIAGNOSIS — N184 Chronic kidney disease, stage 4 (severe): Secondary | ICD-10-CM | POA: Diagnosis not present

## 2017-12-13 DIAGNOSIS — J449 Chronic obstructive pulmonary disease, unspecified: Secondary | ICD-10-CM | POA: Diagnosis not present

## 2017-12-13 DIAGNOSIS — D631 Anemia in chronic kidney disease: Secondary | ICD-10-CM | POA: Diagnosis not present

## 2017-12-13 LAB — CULTURE, BLOOD (ROUTINE X 2)
Culture: NO GROWTH
Culture: NO GROWTH
Special Requests: ADEQUATE

## 2017-12-15 DIAGNOSIS — N184 Chronic kidney disease, stage 4 (severe): Secondary | ICD-10-CM | POA: Diagnosis not present

## 2017-12-15 DIAGNOSIS — J449 Chronic obstructive pulmonary disease, unspecified: Secondary | ICD-10-CM | POA: Diagnosis not present

## 2017-12-15 DIAGNOSIS — I5043 Acute on chronic combined systolic (congestive) and diastolic (congestive) heart failure: Secondary | ICD-10-CM | POA: Diagnosis not present

## 2017-12-15 DIAGNOSIS — I130 Hypertensive heart and chronic kidney disease with heart failure and stage 1 through stage 4 chronic kidney disease, or unspecified chronic kidney disease: Secondary | ICD-10-CM | POA: Diagnosis not present

## 2017-12-15 DIAGNOSIS — I2510 Atherosclerotic heart disease of native coronary artery without angina pectoris: Secondary | ICD-10-CM | POA: Diagnosis not present

## 2017-12-15 DIAGNOSIS — D631 Anemia in chronic kidney disease: Secondary | ICD-10-CM | POA: Diagnosis not present

## 2017-12-17 DIAGNOSIS — J449 Chronic obstructive pulmonary disease, unspecified: Secondary | ICD-10-CM | POA: Diagnosis not present

## 2017-12-17 DIAGNOSIS — B952 Enterococcus as the cause of diseases classified elsewhere: Secondary | ICD-10-CM | POA: Diagnosis not present

## 2017-12-17 DIAGNOSIS — I5043 Acute on chronic combined systolic (congestive) and diastolic (congestive) heart failure: Secondary | ICD-10-CM | POA: Diagnosis not present

## 2017-12-17 DIAGNOSIS — D631 Anemia in chronic kidney disease: Secondary | ICD-10-CM | POA: Diagnosis not present

## 2017-12-17 DIAGNOSIS — J9601 Acute respiratory failure with hypoxia: Secondary | ICD-10-CM | POA: Diagnosis not present

## 2017-12-17 DIAGNOSIS — I2510 Atherosclerotic heart disease of native coronary artery without angina pectoris: Secondary | ICD-10-CM | POA: Diagnosis not present

## 2017-12-17 DIAGNOSIS — Z5181 Encounter for therapeutic drug level monitoring: Secondary | ICD-10-CM | POA: Diagnosis not present

## 2017-12-17 DIAGNOSIS — N390 Urinary tract infection, site not specified: Secondary | ICD-10-CM | POA: Diagnosis not present

## 2017-12-17 DIAGNOSIS — E039 Hypothyroidism, unspecified: Secondary | ICD-10-CM | POA: Diagnosis not present

## 2017-12-17 DIAGNOSIS — I130 Hypertensive heart and chronic kidney disease with heart failure and stage 1 through stage 4 chronic kidney disease, or unspecified chronic kidney disease: Secondary | ICD-10-CM | POA: Diagnosis not present

## 2017-12-17 DIAGNOSIS — I255 Ischemic cardiomyopathy: Secondary | ICD-10-CM | POA: Diagnosis not present

## 2017-12-17 DIAGNOSIS — N184 Chronic kidney disease, stage 4 (severe): Secondary | ICD-10-CM | POA: Diagnosis not present

## 2017-12-17 DIAGNOSIS — K219 Gastro-esophageal reflux disease without esophagitis: Secondary | ICD-10-CM | POA: Diagnosis not present

## 2017-12-18 DIAGNOSIS — D631 Anemia in chronic kidney disease: Secondary | ICD-10-CM | POA: Diagnosis not present

## 2017-12-18 DIAGNOSIS — N184 Chronic kidney disease, stage 4 (severe): Secondary | ICD-10-CM | POA: Diagnosis not present

## 2017-12-18 DIAGNOSIS — I2510 Atherosclerotic heart disease of native coronary artery without angina pectoris: Secondary | ICD-10-CM | POA: Diagnosis not present

## 2017-12-18 DIAGNOSIS — J449 Chronic obstructive pulmonary disease, unspecified: Secondary | ICD-10-CM | POA: Diagnosis not present

## 2017-12-18 DIAGNOSIS — I130 Hypertensive heart and chronic kidney disease with heart failure and stage 1 through stage 4 chronic kidney disease, or unspecified chronic kidney disease: Secondary | ICD-10-CM | POA: Diagnosis not present

## 2017-12-18 DIAGNOSIS — I5043 Acute on chronic combined systolic (congestive) and diastolic (congestive) heart failure: Secondary | ICD-10-CM | POA: Diagnosis not present

## 2017-12-19 DIAGNOSIS — I2510 Atherosclerotic heart disease of native coronary artery without angina pectoris: Secondary | ICD-10-CM | POA: Diagnosis not present

## 2017-12-19 DIAGNOSIS — N390 Urinary tract infection, site not specified: Secondary | ICD-10-CM | POA: Diagnosis not present

## 2017-12-19 DIAGNOSIS — Z95810 Presence of automatic (implantable) cardiac defibrillator: Secondary | ICD-10-CM | POA: Diagnosis not present

## 2017-12-19 DIAGNOSIS — Z955 Presence of coronary angioplasty implant and graft: Secondary | ICD-10-CM | POA: Diagnosis not present

## 2017-12-19 DIAGNOSIS — D631 Anemia in chronic kidney disease: Secondary | ICD-10-CM | POA: Diagnosis not present

## 2017-12-19 DIAGNOSIS — N184 Chronic kidney disease, stage 4 (severe): Secondary | ICD-10-CM | POA: Diagnosis not present

## 2017-12-19 DIAGNOSIS — Z5181 Encounter for therapeutic drug level monitoring: Secondary | ICD-10-CM | POA: Diagnosis not present

## 2017-12-19 DIAGNOSIS — J9601 Acute respiratory failure with hypoxia: Secondary | ICD-10-CM | POA: Diagnosis not present

## 2017-12-19 DIAGNOSIS — B952 Enterococcus as the cause of diseases classified elsewhere: Secondary | ICD-10-CM | POA: Diagnosis not present

## 2017-12-19 DIAGNOSIS — K219 Gastro-esophageal reflux disease without esophagitis: Secondary | ICD-10-CM | POA: Diagnosis not present

## 2017-12-19 DIAGNOSIS — Z7901 Long term (current) use of anticoagulants: Secondary | ICD-10-CM | POA: Diagnosis not present

## 2017-12-19 DIAGNOSIS — E039 Hypothyroidism, unspecified: Secondary | ICD-10-CM | POA: Diagnosis not present

## 2017-12-19 DIAGNOSIS — R7881 Bacteremia: Secondary | ICD-10-CM | POA: Diagnosis not present

## 2017-12-19 DIAGNOSIS — I5043 Acute on chronic combined systolic (congestive) and diastolic (congestive) heart failure: Secondary | ICD-10-CM | POA: Diagnosis not present

## 2017-12-19 DIAGNOSIS — I255 Ischemic cardiomyopathy: Secondary | ICD-10-CM | POA: Diagnosis not present

## 2017-12-19 DIAGNOSIS — G4733 Obstructive sleep apnea (adult) (pediatric): Secondary | ICD-10-CM | POA: Diagnosis not present

## 2017-12-19 DIAGNOSIS — Z8551 Personal history of malignant neoplasm of bladder: Secondary | ICD-10-CM | POA: Diagnosis not present

## 2017-12-19 DIAGNOSIS — Z452 Encounter for adjustment and management of vascular access device: Secondary | ICD-10-CM | POA: Diagnosis not present

## 2017-12-19 DIAGNOSIS — E785 Hyperlipidemia, unspecified: Secondary | ICD-10-CM | POA: Diagnosis not present

## 2017-12-19 DIAGNOSIS — J449 Chronic obstructive pulmonary disease, unspecified: Secondary | ICD-10-CM | POA: Diagnosis not present

## 2017-12-19 DIAGNOSIS — I130 Hypertensive heart and chronic kidney disease with heart failure and stage 1 through stage 4 chronic kidney disease, or unspecified chronic kidney disease: Secondary | ICD-10-CM | POA: Diagnosis not present

## 2017-12-19 DIAGNOSIS — I4891 Unspecified atrial fibrillation: Secondary | ICD-10-CM | POA: Diagnosis not present

## 2017-12-22 DIAGNOSIS — N390 Urinary tract infection, site not specified: Secondary | ICD-10-CM | POA: Diagnosis not present

## 2017-12-22 DIAGNOSIS — A4181 Sepsis due to Enterococcus: Secondary | ICD-10-CM | POA: Insufficient documentation

## 2017-12-22 DIAGNOSIS — I472 Ventricular tachycardia, unspecified: Secondary | ICD-10-CM | POA: Insufficient documentation

## 2017-12-22 DIAGNOSIS — I130 Hypertensive heart and chronic kidney disease with heart failure and stage 1 through stage 4 chronic kidney disease, or unspecified chronic kidney disease: Secondary | ICD-10-CM | POA: Diagnosis not present

## 2017-12-22 DIAGNOSIS — R8290 Unspecified abnormal findings in urine: Secondary | ICD-10-CM | POA: Diagnosis not present

## 2017-12-22 DIAGNOSIS — Z959 Presence of cardiac and vascular implant and graft, unspecified: Secondary | ICD-10-CM | POA: Insufficient documentation

## 2017-12-23 DIAGNOSIS — R7881 Bacteremia: Secondary | ICD-10-CM | POA: Diagnosis not present

## 2017-12-23 DIAGNOSIS — I2510 Atherosclerotic heart disease of native coronary artery without angina pectoris: Secondary | ICD-10-CM | POA: Diagnosis not present

## 2017-12-23 DIAGNOSIS — N390 Urinary tract infection, site not specified: Secondary | ICD-10-CM | POA: Diagnosis not present

## 2017-12-23 DIAGNOSIS — I130 Hypertensive heart and chronic kidney disease with heart failure and stage 1 through stage 4 chronic kidney disease, or unspecified chronic kidney disease: Secondary | ICD-10-CM | POA: Diagnosis not present

## 2017-12-23 DIAGNOSIS — I255 Ischemic cardiomyopathy: Secondary | ICD-10-CM | POA: Diagnosis not present

## 2017-12-23 DIAGNOSIS — B952 Enterococcus as the cause of diseases classified elsewhere: Secondary | ICD-10-CM | POA: Diagnosis not present

## 2017-12-25 DIAGNOSIS — N390 Urinary tract infection, site not specified: Secondary | ICD-10-CM | POA: Diagnosis not present

## 2017-12-25 DIAGNOSIS — I255 Ischemic cardiomyopathy: Secondary | ICD-10-CM | POA: Diagnosis not present

## 2017-12-25 DIAGNOSIS — B952 Enterococcus as the cause of diseases classified elsewhere: Secondary | ICD-10-CM | POA: Diagnosis not present

## 2017-12-25 DIAGNOSIS — I2510 Atherosclerotic heart disease of native coronary artery without angina pectoris: Secondary | ICD-10-CM | POA: Diagnosis not present

## 2017-12-25 DIAGNOSIS — I130 Hypertensive heart and chronic kidney disease with heart failure and stage 1 through stage 4 chronic kidney disease, or unspecified chronic kidney disease: Secondary | ICD-10-CM | POA: Diagnosis not present

## 2017-12-25 DIAGNOSIS — R7881 Bacteremia: Secondary | ICD-10-CM | POA: Diagnosis not present

## 2017-12-26 ENCOUNTER — Other Ambulatory Visit (HOSPITAL_COMMUNITY): Payer: Self-pay | Admitting: Internal Medicine

## 2017-12-30 DIAGNOSIS — I2510 Atherosclerotic heart disease of native coronary artery without angina pectoris: Secondary | ICD-10-CM | POA: Diagnosis not present

## 2017-12-30 DIAGNOSIS — R7881 Bacteremia: Secondary | ICD-10-CM | POA: Diagnosis not present

## 2017-12-30 DIAGNOSIS — N390 Urinary tract infection, site not specified: Secondary | ICD-10-CM | POA: Diagnosis not present

## 2017-12-30 DIAGNOSIS — B952 Enterococcus as the cause of diseases classified elsewhere: Secondary | ICD-10-CM | POA: Diagnosis not present

## 2017-12-30 DIAGNOSIS — I255 Ischemic cardiomyopathy: Secondary | ICD-10-CM | POA: Diagnosis not present

## 2017-12-30 DIAGNOSIS — I130 Hypertensive heart and chronic kidney disease with heart failure and stage 1 through stage 4 chronic kidney disease, or unspecified chronic kidney disease: Secondary | ICD-10-CM | POA: Diagnosis not present

## 2018-01-01 DIAGNOSIS — I2510 Atherosclerotic heart disease of native coronary artery without angina pectoris: Secondary | ICD-10-CM | POA: Diagnosis not present

## 2018-01-01 DIAGNOSIS — B952 Enterococcus as the cause of diseases classified elsewhere: Secondary | ICD-10-CM | POA: Diagnosis not present

## 2018-01-01 DIAGNOSIS — R7881 Bacteremia: Secondary | ICD-10-CM | POA: Diagnosis not present

## 2018-01-01 DIAGNOSIS — N390 Urinary tract infection, site not specified: Secondary | ICD-10-CM | POA: Diagnosis not present

## 2018-01-01 DIAGNOSIS — I130 Hypertensive heart and chronic kidney disease with heart failure and stage 1 through stage 4 chronic kidney disease, or unspecified chronic kidney disease: Secondary | ICD-10-CM | POA: Diagnosis not present

## 2018-01-01 DIAGNOSIS — I255 Ischemic cardiomyopathy: Secondary | ICD-10-CM | POA: Diagnosis not present

## 2018-01-02 ENCOUNTER — Other Ambulatory Visit (HOSPITAL_COMMUNITY): Payer: Self-pay | Admitting: Internal Medicine

## 2018-01-02 DIAGNOSIS — N390 Urinary tract infection, site not specified: Secondary | ICD-10-CM | POA: Diagnosis not present

## 2018-01-02 DIAGNOSIS — B952 Enterococcus as the cause of diseases classified elsewhere: Secondary | ICD-10-CM | POA: Diagnosis not present

## 2018-01-02 DIAGNOSIS — I2510 Atherosclerotic heart disease of native coronary artery without angina pectoris: Secondary | ICD-10-CM | POA: Diagnosis not present

## 2018-01-02 DIAGNOSIS — I255 Ischemic cardiomyopathy: Secondary | ICD-10-CM | POA: Diagnosis not present

## 2018-01-02 DIAGNOSIS — I130 Hypertensive heart and chronic kidney disease with heart failure and stage 1 through stage 4 chronic kidney disease, or unspecified chronic kidney disease: Secondary | ICD-10-CM | POA: Diagnosis not present

## 2018-01-02 DIAGNOSIS — R7881 Bacteremia: Secondary | ICD-10-CM | POA: Diagnosis not present

## 2018-01-05 ENCOUNTER — Other Ambulatory Visit (HOSPITAL_COMMUNITY): Payer: Self-pay | Admitting: *Deleted

## 2018-01-05 DIAGNOSIS — I2510 Atherosclerotic heart disease of native coronary artery without angina pectoris: Secondary | ICD-10-CM | POA: Diagnosis not present

## 2018-01-05 DIAGNOSIS — I255 Ischemic cardiomyopathy: Secondary | ICD-10-CM | POA: Diagnosis not present

## 2018-01-05 DIAGNOSIS — I130 Hypertensive heart and chronic kidney disease with heart failure and stage 1 through stage 4 chronic kidney disease, or unspecified chronic kidney disease: Secondary | ICD-10-CM | POA: Diagnosis not present

## 2018-01-05 DIAGNOSIS — N390 Urinary tract infection, site not specified: Secondary | ICD-10-CM | POA: Diagnosis not present

## 2018-01-05 DIAGNOSIS — R7881 Bacteremia: Secondary | ICD-10-CM | POA: Diagnosis not present

## 2018-01-05 DIAGNOSIS — B952 Enterococcus as the cause of diseases classified elsewhere: Secondary | ICD-10-CM | POA: Diagnosis not present

## 2018-01-06 DIAGNOSIS — I130 Hypertensive heart and chronic kidney disease with heart failure and stage 1 through stage 4 chronic kidney disease, or unspecified chronic kidney disease: Secondary | ICD-10-CM | POA: Diagnosis not present

## 2018-01-07 ENCOUNTER — Other Ambulatory Visit (HOSPITAL_COMMUNITY): Payer: Self-pay | Admitting: *Deleted

## 2018-01-07 MED ORDER — AMIODARONE HCL 200 MG PO TABS: 200.0000 mg | ORAL_TABLET | Freq: Two times a day (BID) | ORAL | 0 refills | Status: DC

## 2018-01-08 DIAGNOSIS — I2510 Atherosclerotic heart disease of native coronary artery without angina pectoris: Secondary | ICD-10-CM | POA: Diagnosis not present

## 2018-01-08 DIAGNOSIS — N390 Urinary tract infection, site not specified: Secondary | ICD-10-CM | POA: Diagnosis not present

## 2018-01-08 DIAGNOSIS — I255 Ischemic cardiomyopathy: Secondary | ICD-10-CM | POA: Diagnosis not present

## 2018-01-08 DIAGNOSIS — I130 Hypertensive heart and chronic kidney disease with heart failure and stage 1 through stage 4 chronic kidney disease, or unspecified chronic kidney disease: Secondary | ICD-10-CM | POA: Diagnosis not present

## 2018-01-08 DIAGNOSIS — B952 Enterococcus as the cause of diseases classified elsewhere: Secondary | ICD-10-CM | POA: Diagnosis not present

## 2018-01-08 DIAGNOSIS — R7881 Bacteremia: Secondary | ICD-10-CM | POA: Diagnosis not present

## 2018-01-12 DIAGNOSIS — Z959 Presence of cardiac and vascular implant and graft, unspecified: Secondary | ICD-10-CM | POA: Diagnosis not present

## 2018-01-12 DIAGNOSIS — I472 Ventricular tachycardia: Secondary | ICD-10-CM | POA: Diagnosis not present

## 2018-01-12 DIAGNOSIS — J449 Chronic obstructive pulmonary disease, unspecified: Secondary | ICD-10-CM | POA: Diagnosis not present

## 2018-01-12 DIAGNOSIS — I255 Ischemic cardiomyopathy: Secondary | ICD-10-CM | POA: Diagnosis not present

## 2018-01-12 DIAGNOSIS — I509 Heart failure, unspecified: Secondary | ICD-10-CM | POA: Diagnosis not present

## 2018-01-12 DIAGNOSIS — A4181 Sepsis due to Enterococcus: Secondary | ICD-10-CM | POA: Diagnosis not present

## 2018-01-12 DIAGNOSIS — Z6825 Body mass index (BMI) 25.0-25.9, adult: Secondary | ICD-10-CM | POA: Diagnosis not present

## 2018-01-12 DIAGNOSIS — I130 Hypertensive heart and chronic kidney disease with heart failure and stage 1 through stage 4 chronic kidney disease, or unspecified chronic kidney disease: Secondary | ICD-10-CM | POA: Diagnosis not present

## 2018-01-12 DIAGNOSIS — Z95810 Presence of automatic (implantable) cardiac defibrillator: Secondary | ICD-10-CM | POA: Diagnosis not present

## 2018-01-12 DIAGNOSIS — B952 Enterococcus as the cause of diseases classified elsewhere: Secondary | ICD-10-CM | POA: Diagnosis not present

## 2018-01-12 DIAGNOSIS — R7881 Bacteremia: Secondary | ICD-10-CM | POA: Diagnosis not present

## 2018-01-12 DIAGNOSIS — I2510 Atherosclerotic heart disease of native coronary artery without angina pectoris: Secondary | ICD-10-CM | POA: Diagnosis not present

## 2018-01-12 DIAGNOSIS — I480 Paroxysmal atrial fibrillation: Secondary | ICD-10-CM | POA: Diagnosis not present

## 2018-01-12 DIAGNOSIS — N390 Urinary tract infection, site not specified: Secondary | ICD-10-CM | POA: Diagnosis not present

## 2018-01-12 DIAGNOSIS — N184 Chronic kidney disease, stage 4 (severe): Secondary | ICD-10-CM | POA: Diagnosis not present

## 2018-01-12 DIAGNOSIS — I700 Atherosclerosis of aorta: Secondary | ICD-10-CM | POA: Diagnosis not present

## 2018-01-12 DIAGNOSIS — F3289 Other specified depressive episodes: Secondary | ICD-10-CM | POA: Diagnosis not present

## 2018-01-16 DIAGNOSIS — I255 Ischemic cardiomyopathy: Secondary | ICD-10-CM | POA: Diagnosis not present

## 2018-01-16 DIAGNOSIS — I2510 Atherosclerotic heart disease of native coronary artery without angina pectoris: Secondary | ICD-10-CM | POA: Diagnosis not present

## 2018-01-16 DIAGNOSIS — B952 Enterococcus as the cause of diseases classified elsewhere: Secondary | ICD-10-CM | POA: Diagnosis not present

## 2018-01-16 DIAGNOSIS — R7881 Bacteremia: Secondary | ICD-10-CM | POA: Diagnosis not present

## 2018-01-16 DIAGNOSIS — N390 Urinary tract infection, site not specified: Secondary | ICD-10-CM | POA: Diagnosis not present

## 2018-01-16 DIAGNOSIS — I130 Hypertensive heart and chronic kidney disease with heart failure and stage 1 through stage 4 chronic kidney disease, or unspecified chronic kidney disease: Secondary | ICD-10-CM | POA: Diagnosis not present

## 2018-01-20 DIAGNOSIS — D472 Monoclonal gammopathy: Secondary | ICD-10-CM | POA: Diagnosis not present

## 2018-01-20 DIAGNOSIS — N184 Chronic kidney disease, stage 4 (severe): Secondary | ICD-10-CM | POA: Diagnosis not present

## 2018-01-21 ENCOUNTER — Ambulatory Visit (INDEPENDENT_AMBULATORY_CARE_PROVIDER_SITE_OTHER): Payer: Medicare Other | Admitting: *Deleted

## 2018-01-21 DIAGNOSIS — I255 Ischemic cardiomyopathy: Secondary | ICD-10-CM | POA: Diagnosis not present

## 2018-01-21 DIAGNOSIS — I5022 Chronic systolic (congestive) heart failure: Secondary | ICD-10-CM

## 2018-01-21 NOTE — Progress Notes (Signed)
Remote ICD transmission.   

## 2018-01-22 ENCOUNTER — Encounter: Payer: Self-pay | Admitting: Cardiology

## 2018-01-22 DIAGNOSIS — B952 Enterococcus as the cause of diseases classified elsewhere: Secondary | ICD-10-CM | POA: Diagnosis not present

## 2018-01-22 DIAGNOSIS — N390 Urinary tract infection, site not specified: Secondary | ICD-10-CM | POA: Diagnosis not present

## 2018-01-22 DIAGNOSIS — I255 Ischemic cardiomyopathy: Secondary | ICD-10-CM | POA: Diagnosis not present

## 2018-01-22 DIAGNOSIS — I130 Hypertensive heart and chronic kidney disease with heart failure and stage 1 through stage 4 chronic kidney disease, or unspecified chronic kidney disease: Secondary | ICD-10-CM | POA: Diagnosis not present

## 2018-01-22 DIAGNOSIS — R7881 Bacteremia: Secondary | ICD-10-CM | POA: Diagnosis not present

## 2018-01-22 DIAGNOSIS — I2510 Atherosclerotic heart disease of native coronary artery without angina pectoris: Secondary | ICD-10-CM | POA: Diagnosis not present

## 2018-01-23 DIAGNOSIS — N184 Chronic kidney disease, stage 4 (severe): Secondary | ICD-10-CM | POA: Diagnosis not present

## 2018-01-23 DIAGNOSIS — D472 Monoclonal gammopathy: Secondary | ICD-10-CM | POA: Diagnosis not present

## 2018-01-23 DIAGNOSIS — I129 Hypertensive chronic kidney disease with stage 1 through stage 4 chronic kidney disease, or unspecified chronic kidney disease: Secondary | ICD-10-CM | POA: Diagnosis not present

## 2018-01-26 DIAGNOSIS — R7881 Bacteremia: Secondary | ICD-10-CM | POA: Diagnosis not present

## 2018-01-26 DIAGNOSIS — I255 Ischemic cardiomyopathy: Secondary | ICD-10-CM | POA: Diagnosis not present

## 2018-01-26 DIAGNOSIS — I2510 Atherosclerotic heart disease of native coronary artery without angina pectoris: Secondary | ICD-10-CM | POA: Diagnosis not present

## 2018-01-26 DIAGNOSIS — I130 Hypertensive heart and chronic kidney disease with heart failure and stage 1 through stage 4 chronic kidney disease, or unspecified chronic kidney disease: Secondary | ICD-10-CM | POA: Diagnosis not present

## 2018-01-26 DIAGNOSIS — N390 Urinary tract infection, site not specified: Secondary | ICD-10-CM | POA: Diagnosis not present

## 2018-01-26 DIAGNOSIS — B952 Enterococcus as the cause of diseases classified elsewhere: Secondary | ICD-10-CM | POA: Diagnosis not present

## 2018-01-27 DIAGNOSIS — R7881 Bacteremia: Secondary | ICD-10-CM | POA: Diagnosis not present

## 2018-01-27 DIAGNOSIS — I130 Hypertensive heart and chronic kidney disease with heart failure and stage 1 through stage 4 chronic kidney disease, or unspecified chronic kidney disease: Secondary | ICD-10-CM | POA: Diagnosis not present

## 2018-01-27 DIAGNOSIS — N390 Urinary tract infection, site not specified: Secondary | ICD-10-CM | POA: Diagnosis not present

## 2018-01-27 DIAGNOSIS — I255 Ischemic cardiomyopathy: Secondary | ICD-10-CM | POA: Diagnosis not present

## 2018-01-27 DIAGNOSIS — B952 Enterococcus as the cause of diseases classified elsewhere: Secondary | ICD-10-CM | POA: Diagnosis not present

## 2018-01-27 DIAGNOSIS — I2510 Atherosclerotic heart disease of native coronary artery without angina pectoris: Secondary | ICD-10-CM | POA: Diagnosis not present

## 2018-02-02 DIAGNOSIS — I130 Hypertensive heart and chronic kidney disease with heart failure and stage 1 through stage 4 chronic kidney disease, or unspecified chronic kidney disease: Secondary | ICD-10-CM | POA: Diagnosis not present

## 2018-02-02 DIAGNOSIS — I255 Ischemic cardiomyopathy: Secondary | ICD-10-CM | POA: Diagnosis not present

## 2018-02-02 DIAGNOSIS — B952 Enterococcus as the cause of diseases classified elsewhere: Secondary | ICD-10-CM | POA: Diagnosis not present

## 2018-02-02 DIAGNOSIS — R7881 Bacteremia: Secondary | ICD-10-CM | POA: Diagnosis not present

## 2018-02-02 DIAGNOSIS — N390 Urinary tract infection, site not specified: Secondary | ICD-10-CM | POA: Diagnosis not present

## 2018-02-02 DIAGNOSIS — I2510 Atherosclerotic heart disease of native coronary artery without angina pectoris: Secondary | ICD-10-CM | POA: Diagnosis not present

## 2018-02-04 ENCOUNTER — Telehealth (HOSPITAL_COMMUNITY): Payer: Self-pay | Admitting: *Deleted

## 2018-02-04 DIAGNOSIS — I2510 Atherosclerotic heart disease of native coronary artery without angina pectoris: Secondary | ICD-10-CM | POA: Diagnosis not present

## 2018-02-04 DIAGNOSIS — N390 Urinary tract infection, site not specified: Secondary | ICD-10-CM | POA: Diagnosis not present

## 2018-02-04 DIAGNOSIS — I130 Hypertensive heart and chronic kidney disease with heart failure and stage 1 through stage 4 chronic kidney disease, or unspecified chronic kidney disease: Secondary | ICD-10-CM | POA: Diagnosis not present

## 2018-02-04 DIAGNOSIS — B952 Enterococcus as the cause of diseases classified elsewhere: Secondary | ICD-10-CM | POA: Diagnosis not present

## 2018-02-04 DIAGNOSIS — R7881 Bacteremia: Secondary | ICD-10-CM | POA: Diagnosis not present

## 2018-02-04 DIAGNOSIS — I255 Ischemic cardiomyopathy: Secondary | ICD-10-CM | POA: Diagnosis not present

## 2018-02-04 NOTE — Telephone Encounter (Signed)
[facility] called to get VO to continue OT for pt, gave VO per Dr Haroldine Laws

## 2018-02-05 DIAGNOSIS — I2510 Atherosclerotic heart disease of native coronary artery without angina pectoris: Secondary | ICD-10-CM | POA: Diagnosis not present

## 2018-02-05 DIAGNOSIS — B952 Enterococcus as the cause of diseases classified elsewhere: Secondary | ICD-10-CM | POA: Diagnosis not present

## 2018-02-05 DIAGNOSIS — R7881 Bacteremia: Secondary | ICD-10-CM | POA: Diagnosis not present

## 2018-02-05 DIAGNOSIS — N390 Urinary tract infection, site not specified: Secondary | ICD-10-CM | POA: Diagnosis not present

## 2018-02-05 DIAGNOSIS — I130 Hypertensive heart and chronic kidney disease with heart failure and stage 1 through stage 4 chronic kidney disease, or unspecified chronic kidney disease: Secondary | ICD-10-CM | POA: Diagnosis not present

## 2018-02-05 DIAGNOSIS — I255 Ischemic cardiomyopathy: Secondary | ICD-10-CM | POA: Diagnosis not present

## 2018-02-09 DIAGNOSIS — I255 Ischemic cardiomyopathy: Secondary | ICD-10-CM | POA: Diagnosis not present

## 2018-02-09 DIAGNOSIS — I130 Hypertensive heart and chronic kidney disease with heart failure and stage 1 through stage 4 chronic kidney disease, or unspecified chronic kidney disease: Secondary | ICD-10-CM | POA: Diagnosis not present

## 2018-02-09 DIAGNOSIS — R7881 Bacteremia: Secondary | ICD-10-CM | POA: Diagnosis not present

## 2018-02-09 DIAGNOSIS — N390 Urinary tract infection, site not specified: Secondary | ICD-10-CM | POA: Diagnosis not present

## 2018-02-09 DIAGNOSIS — B952 Enterococcus as the cause of diseases classified elsewhere: Secondary | ICD-10-CM | POA: Diagnosis not present

## 2018-02-09 DIAGNOSIS — I2510 Atherosclerotic heart disease of native coronary artery without angina pectoris: Secondary | ICD-10-CM | POA: Diagnosis not present

## 2018-02-10 DIAGNOSIS — B952 Enterococcus as the cause of diseases classified elsewhere: Secondary | ICD-10-CM | POA: Diagnosis not present

## 2018-02-10 DIAGNOSIS — I255 Ischemic cardiomyopathy: Secondary | ICD-10-CM | POA: Diagnosis not present

## 2018-02-10 DIAGNOSIS — I2510 Atherosclerotic heart disease of native coronary artery without angina pectoris: Secondary | ICD-10-CM | POA: Diagnosis not present

## 2018-02-10 DIAGNOSIS — N390 Urinary tract infection, site not specified: Secondary | ICD-10-CM | POA: Diagnosis not present

## 2018-02-10 DIAGNOSIS — I130 Hypertensive heart and chronic kidney disease with heart failure and stage 1 through stage 4 chronic kidney disease, or unspecified chronic kidney disease: Secondary | ICD-10-CM | POA: Diagnosis not present

## 2018-02-10 DIAGNOSIS — R7881 Bacteremia: Secondary | ICD-10-CM | POA: Diagnosis not present

## 2018-02-13 DIAGNOSIS — I130 Hypertensive heart and chronic kidney disease with heart failure and stage 1 through stage 4 chronic kidney disease, or unspecified chronic kidney disease: Secondary | ICD-10-CM | POA: Diagnosis not present

## 2018-02-13 DIAGNOSIS — N390 Urinary tract infection, site not specified: Secondary | ICD-10-CM | POA: Diagnosis not present

## 2018-02-13 DIAGNOSIS — R7881 Bacteremia: Secondary | ICD-10-CM | POA: Diagnosis not present

## 2018-02-13 DIAGNOSIS — I255 Ischemic cardiomyopathy: Secondary | ICD-10-CM | POA: Diagnosis not present

## 2018-02-13 DIAGNOSIS — I2510 Atherosclerotic heart disease of native coronary artery without angina pectoris: Secondary | ICD-10-CM | POA: Diagnosis not present

## 2018-02-13 DIAGNOSIS — B952 Enterococcus as the cause of diseases classified elsewhere: Secondary | ICD-10-CM | POA: Diagnosis not present

## 2018-02-16 DIAGNOSIS — B952 Enterococcus as the cause of diseases classified elsewhere: Secondary | ICD-10-CM | POA: Diagnosis not present

## 2018-02-16 DIAGNOSIS — I2510 Atherosclerotic heart disease of native coronary artery without angina pectoris: Secondary | ICD-10-CM | POA: Diagnosis not present

## 2018-02-16 DIAGNOSIS — I255 Ischemic cardiomyopathy: Secondary | ICD-10-CM | POA: Diagnosis not present

## 2018-02-16 DIAGNOSIS — I130 Hypertensive heart and chronic kidney disease with heart failure and stage 1 through stage 4 chronic kidney disease, or unspecified chronic kidney disease: Secondary | ICD-10-CM | POA: Diagnosis not present

## 2018-02-16 DIAGNOSIS — R7881 Bacteremia: Secondary | ICD-10-CM | POA: Diagnosis not present

## 2018-02-16 DIAGNOSIS — N390 Urinary tract infection, site not specified: Secondary | ICD-10-CM | POA: Diagnosis not present

## 2018-02-20 LAB — CUP PACEART REMOTE DEVICE CHECK
Battery Remaining Longevity: 20 mo
Battery Voltage: 2.92 V
Brady Statistic AP VS Percent: 1 %
Brady Statistic AS VS Percent: 1 %
Brady Statistic RA Percent Paced: 97 %
Date Time Interrogation Session: 20190410060018
HIGH POWER IMPEDANCE MEASURED VALUE: 42 Ohm
HIGH POWER IMPEDANCE MEASURED VALUE: 42 Ohm
Implantable Lead Implant Date: 20060509
Implantable Lead Implant Date: 20090417
Implantable Lead Location: 753858
Implantable Lead Location: 753859
Implantable Pulse Generator Implant Date: 20141208
Lead Channel Pacing Threshold Amplitude: 1 V
Lead Channel Pacing Threshold Pulse Width: 0.5 ms
Lead Channel Sensing Intrinsic Amplitude: 2.4 mV
Lead Channel Setting Pacing Amplitude: 2.125
Lead Channel Setting Pacing Amplitude: 2.125
Lead Channel Setting Pacing Pulse Width: 0.8 ms
Lead Channel Setting Sensing Sensitivity: 0.5 mV
MDC IDC LEAD IMPLANT DT: 20060509
MDC IDC LEAD LOCATION: 753860
MDC IDC MSMT BATTERY REMAINING PERCENTAGE: 31 %
MDC IDC MSMT LEADCHNL LV IMPEDANCE VALUE: 400 Ohm
MDC IDC MSMT LEADCHNL LV PACING THRESHOLD AMPLITUDE: 1.125 V
MDC IDC MSMT LEADCHNL LV PACING THRESHOLD PULSEWIDTH: 0.8 ms
MDC IDC MSMT LEADCHNL RA IMPEDANCE VALUE: 380 Ohm
MDC IDC MSMT LEADCHNL RV IMPEDANCE VALUE: 330 Ohm
MDC IDC MSMT LEADCHNL RV PACING THRESHOLD AMPLITUDE: 1.125 V
MDC IDC MSMT LEADCHNL RV PACING THRESHOLD PULSEWIDTH: 0.8 ms
MDC IDC MSMT LEADCHNL RV SENSING INTR AMPL: 12 mV
MDC IDC SET LEADCHNL LV PACING PULSEWIDTH: 0.8 ms
MDC IDC SET LEADCHNL RA PACING AMPLITUDE: 2 V
MDC IDC STAT BRADY AP VP PERCENT: 98 %
MDC IDC STAT BRADY AS VP PERCENT: 2.4 %
Pulse Gen Serial Number: 7138995

## 2018-02-23 ENCOUNTER — Other Ambulatory Visit (HOSPITAL_COMMUNITY): Payer: Self-pay | Admitting: Internal Medicine

## 2018-02-24 DIAGNOSIS — H10501 Unspecified blepharoconjunctivitis, right eye: Secondary | ICD-10-CM | POA: Diagnosis not present

## 2018-02-26 ENCOUNTER — Other Ambulatory Visit (HOSPITAL_COMMUNITY): Payer: Self-pay | Admitting: Internal Medicine

## 2018-03-17 DIAGNOSIS — D692 Other nonthrombocytopenic purpura: Secondary | ICD-10-CM | POA: Diagnosis not present

## 2018-03-17 DIAGNOSIS — L821 Other seborrheic keratosis: Secondary | ICD-10-CM | POA: Diagnosis not present

## 2018-03-17 DIAGNOSIS — Z85828 Personal history of other malignant neoplasm of skin: Secondary | ICD-10-CM | POA: Diagnosis not present

## 2018-03-17 DIAGNOSIS — L570 Actinic keratosis: Secondary | ICD-10-CM | POA: Diagnosis not present

## 2018-04-09 ENCOUNTER — Ambulatory Visit (HOSPITAL_COMMUNITY)
Admission: RE | Admit: 2018-04-09 | Discharge: 2018-04-09 | Disposition: A | Discharge status: Home / Self Care | Payer: Medicare Other | Source: Ambulatory Visit | Attending: Internal Medicine | Admitting: Internal Medicine

## 2018-04-09 VITALS — BP 104/60 | HR 86 | Wt 178.0 lb

## 2018-04-09 DIAGNOSIS — I252 Old myocardial infarction: Secondary | ICD-10-CM | POA: Diagnosis not present

## 2018-04-09 DIAGNOSIS — G4730 Sleep apnea, unspecified: Secondary | ICD-10-CM | POA: Diagnosis not present

## 2018-04-09 DIAGNOSIS — Z95810 Presence of automatic (implantable) cardiac defibrillator: Secondary | ICD-10-CM

## 2018-04-09 DIAGNOSIS — I2510 Atherosclerotic heart disease of native coronary artery without angina pectoris: Secondary | ICD-10-CM | POA: Diagnosis not present

## 2018-04-09 DIAGNOSIS — E785 Hyperlipidemia, unspecified: Secondary | ICD-10-CM | POA: Diagnosis not present

## 2018-04-09 DIAGNOSIS — E039 Hypothyroidism, unspecified: Secondary | ICD-10-CM | POA: Insufficient documentation

## 2018-04-09 DIAGNOSIS — Z79899 Other long term (current) drug therapy: Secondary | ICD-10-CM | POA: Insufficient documentation

## 2018-04-09 DIAGNOSIS — I5022 Chronic systolic (congestive) heart failure: Secondary | ICD-10-CM | POA: Diagnosis not present

## 2018-04-09 DIAGNOSIS — Z7901 Long term (current) use of anticoagulants: Secondary | ICD-10-CM | POA: Insufficient documentation

## 2018-04-09 DIAGNOSIS — D649 Anemia, unspecified: Secondary | ICD-10-CM | POA: Diagnosis not present

## 2018-04-09 DIAGNOSIS — Z955 Presence of coronary angioplasty implant and graft: Secondary | ICD-10-CM | POA: Diagnosis not present

## 2018-04-09 DIAGNOSIS — Z8551 Personal history of malignant neoplasm of bladder: Secondary | ICD-10-CM | POA: Insufficient documentation

## 2018-04-09 DIAGNOSIS — Z79890 Hormone replacement therapy (postmenopausal): Secondary | ICD-10-CM | POA: Diagnosis not present

## 2018-04-09 DIAGNOSIS — G250 Essential tremor: Secondary | ICD-10-CM | POA: Diagnosis not present

## 2018-04-09 DIAGNOSIS — N400 Enlarged prostate without lower urinary tract symptoms: Secondary | ICD-10-CM | POA: Insufficient documentation

## 2018-04-09 DIAGNOSIS — Z85828 Personal history of other malignant neoplasm of skin: Secondary | ICD-10-CM | POA: Insufficient documentation

## 2018-04-09 DIAGNOSIS — I480 Paroxysmal atrial fibrillation: Secondary | ICD-10-CM | POA: Diagnosis not present

## 2018-04-09 DIAGNOSIS — N184 Chronic kidney disease, stage 4 (severe): Secondary | ICD-10-CM | POA: Insufficient documentation

## 2018-04-09 DIAGNOSIS — K219 Gastro-esophageal reflux disease without esophagitis: Secondary | ICD-10-CM | POA: Diagnosis not present

## 2018-04-09 DIAGNOSIS — I130 Hypertensive heart and chronic kidney disease with heart failure and stage 1 through stage 4 chronic kidney disease, or unspecified chronic kidney disease: Secondary | ICD-10-CM | POA: Diagnosis not present

## 2018-04-09 LAB — COMPREHENSIVE METABOLIC PANEL
ALT: 103 U/L — AB (ref 0–44)
AST: 97 U/L — ABNORMAL HIGH (ref 15–41)
Albumin: 3.7 g/dL (ref 3.5–5.0)
Alkaline Phosphatase: 65 U/L (ref 38–126)
Anion gap: 10 (ref 5–15)
BUN: 37 mg/dL — ABNORMAL HIGH (ref 8–23)
CHLORIDE: 106 mmol/L (ref 98–111)
CO2: 26 mmol/L (ref 22–32)
Calcium: 9.1 mg/dL (ref 8.9–10.3)
Creatinine, Ser: 2.72 mg/dL — ABNORMAL HIGH (ref 0.61–1.24)
GFR calc Af Amer: 23 mL/min — ABNORMAL LOW (ref >60)
GFR, EST NON AFRICAN AMERICAN: 20 mL/min — AB (ref >60)
Glucose, Bld: 108 mg/dL — ABNORMAL HIGH (ref 70–99)
Potassium: 4.6 mmol/L (ref 3.5–5.1)
Sodium: 142 mmol/L (ref 135–145)
Total Bilirubin: 0.8 mg/dL (ref 0.3–1.2)
Total Protein: 7.3 g/dL (ref 6.5–8.1)

## 2018-04-09 LAB — CBC
HEMATOCRIT: 42 % (ref 39.0–52.0)
HEMOGLOBIN: 13.1 g/dL (ref 13.0–17.0)
MCH: 29.5 pg (ref 26.0–34.0)
MCHC: 31.2 g/dL (ref 30.0–36.0)
MCV: 94.6 fL (ref 78.0–100.0)
Platelets: 198 10*3/uL (ref 150–400)
RBC: 4.44 MIL/uL (ref 4.22–5.81)
RDW: 14.5 % (ref 11.5–15.5)
WBC: 6.9 10*3/uL (ref 4.0–10.5)

## 2018-04-09 LAB — T4, FREE: Free T4: 1.38 ng/dL (ref 0.82–1.77)

## 2018-04-09 LAB — TSH: TSH: 1.221 u[IU]/mL (ref 0.350–4.500)

## 2018-04-09 MED ORDER — AMIODARONE HCL 100 MG PO TABS: ORAL_TABLET | ORAL | 0 refills | Status: DC

## 2018-04-09 NOTE — Addendum Note (Signed)
Encounter addended by: Darron Doom, RN on: 04/09/2018 1:56 PM  Actions taken: Diagnosis association updated, Order list changed, Sign clinical note

## 2018-04-09 NOTE — Progress Notes (Signed)
Patient ID: [name], male   DOB: 1932/03/04, 84 y.o.   MRN: [id number]    Advanced Heart Failure Clinic Note   PCP: [name]  HPI: [name] is a 84 y.o. male with a history of coronary artery disease, status post previous large anterior wall myocardial infarction s/p multivessel stenting, systolic CHF with EF 08% s/p St. Jude BiV ICD.  Remainder of his medical history is notable for atrial fibrillation,maintaining sinus rhythm on amiodarone.  He is not on Coumadin secondary to GI bleed, chronic renal insufficiency with baseline creatinine about 2.2-2.5, hypertension, hyperlipidemia, and a systemic tremor.Bladder cancer 05/2012. Completed BCG treatment.   2015 found that his left ventricular lead has not been capturing suggestive of either fracture of the proximal electrode or an issue at the header. They reprogrammed the device to use to the RV coil which he tolerated well and had a good threshold.   He was admitted in 11/17 for respiratory virus.  Admitted 12/30 - 10/15/16 with A/C CHF in setting of URI. Thought to have mild CHF, so given IV diuresis in house with rapid improvement.   Admitted 2/19 with enterococcus bacteremia thought to be UTI. Treated with daptomycin. TEE EF 20-25% no vegetation mod MR  Pt presents today for regular follow up. Says since his hospitalization his essential tremor has gotten much worse. Had home PT but now completed. Says he has poor balance and has a couple of falls. Otherwise doing ok. No edema, orthopnea, PND or CP. In looking back at records amio was increased from 100 daily to 200 bid.      ICD: Personally reviewed in clinic. No VT/VF. No AF alerts. Thoracic impedence well above threshold but normalizing. No printout of pt activity on todays report.   Echo 12/14 LVEF 20-25% Carotid u/s 3/14: 40-59% R 0-39%%L Echo today shows: EF 25%   Review of systems complete and found to be negative unless listed in HPI.    Past Medical History:    Diagnosis Date  . Acute bronchitis with COPD ([name]) 09/10/2016  . Anemia   . Atrial fibrillation or flutter    maintaining sinus on amiodarone    . Bladder cancer ([name]) dx'd 05/2012  . BPH (benign prostatic hyperplasia)   . CAD (coronary artery disease)     a. s/p anterior MI 12/05 c/b shock -> stent LAD   b. s/p stenting OM-1, 2/06  . CHF (congestive heart failure) (HCC)    due to ischemic CM  a. EF 20-30%. (Nov 2008)   b. s/p St. Jude BiV-ICD    c. CPX 07/2008  pvo2 16.3 (63% predicted) slope 34 RER 1.08 O2 pulse 93%  . Cough    occasional /productive, no fever/ not new  . CRI (chronic renal insufficiency)    (baseline 2.0-2.2)/ recent hospitalization 8/13  . GERD (gastroesophageal reflux disease)    hx Barretts esophagitis  . History of blood transfusion    following coumadin  usage  . HTN (hypertension)    EKG 05/14/12,Chest x ray 8/13, last ICD interrogation 8/13 EPIC  . Hyperlipidemia   . Hypothyroidism   . 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 Neuromuscular disorder ([name])    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"   no neurologist  . Skin cancer    basal cells   facial x 4, 1 right forearm  . Sleep apnea    STOP BANG SCORE 4    Current Outpatient Medications  Medication  Sig Dispense Refill  . acetaminophen (TYLENOL) 325 MG tablet Take 2 tablets (650 mg total) by mouth every 6 (six) hours as needed for mild pain (or Fever >/= 101). (Patient taking differently: Take 1,000 mg by mouth every 6 (six) hours as needed for mild pain or headache. )    . amiodarone (PACERONE) 200 MG tablet Take 1 tablet (200 mg total) by mouth 2 (two) times daily. 60 tablet 0  . carvedilol (COREG) 6.25 MG tablet TAKE 1 TABLET (6.25mg ) BY MOUTH TWICE DAILY WITH A MEAL 60 tablet 2  . ELIQUIS 2.5 MG TABS tablet TAKE 1 TABLET TWICE A DAY 60 tablet 2  . ezetimibe (ZETIA) 10 MG tablet TAKE ONE TABLET BY MOUTH ONCE DAILY WITH SUPPER (Patient taking differently: TAKE ONE  TABLET (10mg ) BY MOUTH ONCE DAILY WITH SUPPER) 90 tablet 3  . ferrous sulfate ([employer] IRON) 325 (65 FE) MG tablet Take 325 mg by mouth daily with supper.     . furosemide (LASIX) 20 MG tablet Take 2 tablets (40 mg total) by mouth daily. 180 tablet 2  . levothyroxine (SYNTHROID, LEVOTHROID) 50 MCG tablet Take 25-50 mcg by mouth daily before breakfast. Take 50 mcg (1 tablet) on Tues, Thurs, Sat, Sun and 25 mcg (1/2 tablet) on Mon, Wed, Fri    . Magnesium Hydroxide (MILK OF MAGNESIA PO) Take 20 mLs by mouth daily as needed (for constipation).    . Multiple Vitamins-Minerals (CENTRUM SILVER PO) Take 1 tablet by mouth at bedtime.     . nitroGLYCERIN (NITROSTAT) 0.4 MG SL tablet DISSOLVE ONE TABLET UNDER THE TONGUE EVERY 5 MINUTES AS NEEDED FOR CHEST PAIN.  DO NOT EXCEED A TOTAL OF 3 DOSES IN 15 MINUTES 25 tablet 0  . pantoprazole (PROTONIX) 40 MG tablet Take 40 mg by mouth daily.     . potassium chloride (K-DUR,KLOR-CON) 10 MEQ tablet Take 1 tablet (10 mEq total) by mouth daily. 30 tablet 0  . rosuvastatin (CRESTOR) 20 MG tablet Take 1 tablet (20 mg total) by mouth daily. (Patient taking differently: Take 20 mg by mouth at bedtime. ) 90 tablet 3  . sodium chloride (OCEAN) 0.65 % SOLN nasal spray Place 1-2 sprays into both nostrils daily as needed for congestion.     . tamsulosin (FLOMAX) 0.4 MG CAPS capsule Take 0.4 mg by mouth at bedtime.      No current facility-administered medications for this encounter.    PHYSICAL EXAM: Vitals:   04/09/18 1240  BP: 104/60  Pulse: 86  SpO2: 100%  Weight: 178 lb (80.7 kg)   Wt Readings from Last 3 Encounters:  04/09/18 178 lb (80.7 kg)  12/12/17 189 lb 2.5 oz (85.8 kg)  10/24/17 181 lb 12.8 oz (82.5 kg)    General:  General:  Elderly Well appearing. No resp difficulty HEENT: normal Neck: supple. no JVD. Carotids 2+ bilat; no bruits. No lymphadenopathy or thryomegaly appreciated. Cor: PMI nondisplaced. Regular rate & rhythm. No rubs, gallops or  murmurs. Lungs: clear Abdomen: soft, nontender, nondistended. No hepatosplenomegaly. No bruits or masses. Good bowel sounds. Extremities: no cyanosis, clubbing, rash, edema Neuro: alert & orientedx3, cranial nerves grossly intact. moves all 4 extremities w/o difficulty. Diffuse tremor. No asterixis  Affect pleasant   EKG: AV dual paced 60 bpm personally reviewed.   ASSESSMENT & PLAN: 1. Chronic systolic CHF. Echo 10/13/2017 LVEF 20-25% (Stable from last year). Echo 2/19 EF 20-25% - Stable NYHA II-III symptoms stable.  - Volume status ok on exam and Corevue.  -  ICD interrogated persoanlly no VT. Volume ok  - Continue lasix 40 mg daily. Can decrease or take extra as needed.  - BP too low to add valsartan back especially with CKD IV. - Continue coreg 6.25 mg BID.  2. CAD - No s/s ischemia. Off ASA due to Eliquis.  - Continue statin. Lipids followed by Dr. Virgina [name].  3. PAF  - EKG today shows NSR, AV paced.  - Amiodarone increased to 200 bid in hospital will decrease to 200 daily for 2 weeks then 100 daily  - Denies bleeding. Eliquis 2.5 mg BID.  4. Carotid ultrasound  - 1-39% bilaterally on last scan 3/15. No repeat warranted. No change.  5. CKD, Stage IV  - Most recent Cr 2.9 - BMET today. Sees Dr. Joelyn Oms at [facility].  6. Essential Tremors  - Followed by PCP. Had a neurologic work up and negative for [employer].   - Tremors worse. Likely due to increased amio. Decrease as above. Check labs including thyroid.    Glori Bickers, MD  1:26 PM

## 2018-04-09 NOTE — Patient Instructions (Signed)
Labs today (will call for abnormal results, otherwise no news is good news)  DECREASE Amiodarone to 200 mg Once Daily for 14 days.  Then decrease to 100 mg Once Daily.  Follow up in 4 months. Please call our clinic in August to schedule your follow up. [phone], opt 3.

## 2018-04-14 DIAGNOSIS — R300 Dysuria: Secondary | ICD-10-CM | POA: Diagnosis not present

## 2018-04-22 ENCOUNTER — Ambulatory Visit (INDEPENDENT_AMBULATORY_CARE_PROVIDER_SITE_OTHER): Payer: Medicare Other | Admitting: *Deleted

## 2018-04-22 DIAGNOSIS — I255 Ischemic cardiomyopathy: Secondary | ICD-10-CM | POA: Diagnosis not present

## 2018-04-22 NOTE — Progress Notes (Signed)
Remote ICD transmission.   

## 2018-04-27 ENCOUNTER — Other Ambulatory Visit (HOSPITAL_COMMUNITY): Payer: Self-pay | Admitting: Internal Medicine

## 2018-05-01 ENCOUNTER — Ambulatory Visit (INDEPENDENT_AMBULATORY_CARE_PROVIDER_SITE_OTHER): Payer: Medicare Other | Admitting: Podiatry

## 2018-05-01 ENCOUNTER — Encounter: Payer: Self-pay | Admitting: Podiatry

## 2018-05-01 ENCOUNTER — Other Ambulatory Visit: Payer: Self-pay

## 2018-05-01 VITALS — BP 96/49 | HR 67

## 2018-05-01 DIAGNOSIS — M2041 Other hammer toe(s) (acquired), right foot: Secondary | ICD-10-CM | POA: Diagnosis not present

## 2018-05-01 DIAGNOSIS — B351 Tinea unguium: Secondary | ICD-10-CM | POA: Diagnosis not present

## 2018-05-01 DIAGNOSIS — M2042 Other hammer toe(s) (acquired), left foot: Secondary | ICD-10-CM | POA: Diagnosis not present

## 2018-05-01 DIAGNOSIS — Z9229 Personal history of other drug therapy: Secondary | ICD-10-CM

## 2018-05-01 DIAGNOSIS — M79674 Pain in right toe(s): Secondary | ICD-10-CM

## 2018-05-01 DIAGNOSIS — M79675 Pain in left toe(s): Secondary | ICD-10-CM

## 2018-05-03 NOTE — Progress Notes (Signed)
Subjective: [name] is an 84 yo WM who presents today with cc of painful, discolored, thick toenails digits 1-5 b/l feet. Duration is longstanding. He relates tenderness when wearing enclosed shoe gear. Pain is getting progressively worse and relieved with periodic professional debridement.  His wife states she used to trim his toenails, but no longer feels comfortable doing it and thought it would be best if he had professional care.  Medical History   09/10/2016 Acute bronchitis with COPD ([place])  12/16/2013 Left ventricular lead failure to capture on the ring electrode  Date Unknown Anemia  Date Unknown Atrial fibrillation or flutter   dx'd 05/2012 Bladder cancer [facility])  Date Unknown BPH (benign prostatic hyperplasia)  Date Unknown CAD (coronary artery disease)   Date Unknown CHF (congestive heart failure) ([place])   Date Unknown Cough   Date Unknown CRI (chronic renal insufficiency)   Date Unknown GERD (gastroesophageal reflux disease)   Date Unknown History of blood transfusion   Date Unknown HTN (hypertension)   Date Unknown Hyperlipidemia  Date Unknown Hypothyroidism  Date Unknown Neuromuscular disorder ([place])   Date Unknown Skin cancer   Date Unknown Sleep apnea    Problem List   Cardiovascular and Mediastinum   CAD, NATIVE VESSEL   Atrial fibrillation ([place])   Chronic systolic CHF (congestive heart failure) (HCC)-EF 25%   Essential hypertension, benign   Ischemic cardiomyopathy   CAROTID ARTERY DISEASE  Respiratory   Acute respiratory failure with hypoxia (HCC)   Acute bronchitis with COPD (HCC)  Digestive   Nausea & vomiting  Genitourinary   CKD (chronic kidney disease) stage 4, GFR 15-29 ml/min (HCC)   Bladder tumor   UTI (urinary tract infection)   Bladder cancer (HCC)   Renal insufficiency  Other   Mixed hyperlipidemia   CHEST PAIN   ICD (implantable cardioverter-defibrillator) in place (St Jude pacer/ICD)   Knee pain, acute, right   Bruising    Urosepsis   Left ventricular lead failure to capture on the ring electrode   Enterococcal bacteremia   Surgical History   12/10/2017 Tee without cardioversion (N/A)   09/20/2013 Implantable cardioverter defibrillator (icd) generator change (N/A)   07/08/2012 Transurethral resection of bladder tumor   07/08/2012 Cystoscopy w/ ureteral stent removal   07/08/2012 Cystoscopy w/ ureteral stent placement   07/08/2012 Cystoscopy/retrograde/ureteroscopy   05/25/2012 Transurethral resection of bladder tumor   05/25/2012 Cystoscopy w/ retrogrades   1989 Hernia repair   1985 Cervical laminectomy  1972 Back surgery   Date Unknown Cardiac defibrillator placement   Date Unknown Colonoscopy  2005,2006 Coronary angioplasty  Date Unknown Esophagogastroduodenoscopy   Medications    acetaminophen (TYLENOL) 325 MG tablet    amiodarone (PACERONE) 100 MG tablet    carvedilol (COREG) 6.25 MG tablet    ELIQUIS 2.5 MG TABS tablet    ezetimibe (ZETIA) 10 MG tablet    ferrous sulfate ([employer]S IRON) 325 (65 FE) MG tablet    furosemide (LASIX) 20 MG tablet    levothyroxine (SYNTHROID, LEVOTHROID) 50 MCG tablet    Magnesium Hydroxide (MILK OF MAGNESIA PO)    Multiple Vitamins-Minerals (CENTRUM SILVER PO)    nitroGLYCERIN (NITROSTAT) 0.4 MG SL tablet    pantoprazole (PROTONIX) 40 MG tablet    potassium chloride (K-DUR,KLOR-CON) 10 MEQ tablet    rosuvastatin (CRESTOR) 20 MG tablet    sodium chloride (OCEAN) 0.65 % SOLN nasal spray    tamsulosin (FLOMAX) 0.4 MG CAPS capsule    clindamycin (CLEOCIN) 300 MG capsule    tobramycin-dexamethasone (  TOBRADEX) ophthalmic solution    Allergies     RamiprilOther (See Comments)  PenicillinsItching, Rash   Tobacco History   Smoking Status  Former Smoker  Quit 03/02/2004  Types  Cigarettes, Cigars     Smokeless Tobacco Status  Former User  Quit 05/20/1999        Comment  quit in 2005   Family History   Mother (Deceased at age 69)         Father (Deceased)          Brother (Deceased)         Son         Unknown Coronary artery disease         Unknown Stroke    Review of systems complete and found to be negative unless listed here or HPI: CV: ICD in place (St. Jude pacer/ICD), Afib   Objective: Vitals:   05/01/18 0957  BP: (!) 96/49  Pulse: 67   Vascular Examination: Capillary refill time <3 seconds x 10 digits Dorsalis pedis pulses and Posterior tibial pulses present b/l No digital hair x 10 digits Skin temperature WNL b/l  Dermatological Examination: Turgor, texture and tone normal b/l LE Toenails 1-5 b/l discolored, thick, dystrophic with subungual debris and pain with palpation to nailbeds due to thickness of nails.  Musculoskeletal: Muscle strength 5/5 to all LE muscle groups Hammertoes 2-5 b/l  Neurological: Sensation intact with 10 gram monofilament. Vibratory sensation intact.  Assessment: Painful onychomycosis toenails 1-5 b/l in patient on blood thinner (Eliquis)  Plan: 1. Toenails 1-5 b/l were debrided in length and girth without iatrogenic bleeding. 2. Patient to continue soft, supportive shoe gear 3. Patient to report any pedal injuries to medical professional immediately. 4. Avoid self trimming due to use of blood thinner. 5. Follow up 3 months.  6. Patient/POA to call should there be a concern in the interim.

## 2018-05-05 DIAGNOSIS — Z6826 Body mass index (BMI) 26.0-26.9, adult: Secondary | ICD-10-CM | POA: Diagnosis not present

## 2018-05-05 DIAGNOSIS — I509 Heart failure, unspecified: Secondary | ICD-10-CM | POA: Diagnosis not present

## 2018-05-05 DIAGNOSIS — I130 Hypertensive heart and chronic kidney disease with heart failure and stage 1 through stage 4 chronic kidney disease, or unspecified chronic kidney disease: Secondary | ICD-10-CM | POA: Diagnosis not present

## 2018-05-05 DIAGNOSIS — J449 Chronic obstructive pulmonary disease, unspecified: Secondary | ICD-10-CM | POA: Diagnosis not present

## 2018-05-05 DIAGNOSIS — Z95810 Presence of automatic (implantable) cardiac defibrillator: Secondary | ICD-10-CM | POA: Diagnosis not present

## 2018-05-05 DIAGNOSIS — Z950 Presence of cardiac pacemaker: Secondary | ICD-10-CM | POA: Diagnosis not present

## 2018-05-05 DIAGNOSIS — R7309 Other abnormal glucose: Secondary | ICD-10-CM | POA: Diagnosis not present

## 2018-05-05 DIAGNOSIS — I700 Atherosclerosis of aorta: Secondary | ICD-10-CM | POA: Diagnosis not present

## 2018-05-05 DIAGNOSIS — I472 Ventricular tachycardia: Secondary | ICD-10-CM | POA: Diagnosis not present

## 2018-05-05 DIAGNOSIS — F3289 Other specified depressive episodes: Secondary | ICD-10-CM | POA: Diagnosis not present

## 2018-05-05 DIAGNOSIS — I480 Paroxysmal atrial fibrillation: Secondary | ICD-10-CM | POA: Diagnosis not present

## 2018-05-05 DIAGNOSIS — Z1389 Encounter for screening for other disorder: Secondary | ICD-10-CM | POA: Diagnosis not present

## 2018-05-06 DIAGNOSIS — D472 Monoclonal gammopathy: Secondary | ICD-10-CM | POA: Diagnosis not present

## 2018-05-06 DIAGNOSIS — I129 Hypertensive chronic kidney disease with stage 1 through stage 4 chronic kidney disease, or unspecified chronic kidney disease: Secondary | ICD-10-CM | POA: Diagnosis not present

## 2018-05-06 DIAGNOSIS — I509 Heart failure, unspecified: Secondary | ICD-10-CM | POA: Diagnosis not present

## 2018-05-06 DIAGNOSIS — N184 Chronic kidney disease, stage 4 (severe): Secondary | ICD-10-CM | POA: Diagnosis not present

## 2018-05-24 ENCOUNTER — Other Ambulatory Visit (HOSPITAL_COMMUNITY): Payer: Self-pay | Admitting: Internal Medicine

## 2018-05-27 LAB — CUP PACEART REMOTE DEVICE CHECK
Battery Remaining Longevity: 18 mo
Battery Remaining Percentage: 27 %
Brady Statistic AP VP Percent: 99 %
Brady Statistic AS VP Percent: 1 %
Brady Statistic AS VS Percent: 1 %
Brady Statistic RA Percent Paced: 99 %
Date Time Interrogation Session: 20190710074056
HIGH POWER IMPEDANCE MEASURED VALUE: 41 Ohm
HIGH POWER IMPEDANCE MEASURED VALUE: 41 Ohm
Implantable Lead Implant Date: 20060509
Implantable Lead Location: 753858
Implantable Lead Location: 753860
Implantable Lead Model: 5076
Lead Channel Impedance Value: 310 Ohm
Lead Channel Pacing Threshold Amplitude: 1 V
Lead Channel Pacing Threshold Amplitude: 1.375 V
Lead Channel Pacing Threshold Pulse Width: 0.5 ms
Lead Channel Pacing Threshold Pulse Width: 0.8 ms
Lead Channel Sensing Intrinsic Amplitude: 2.6 mV
Lead Channel Setting Pacing Amplitude: 2.125
Lead Channel Setting Pacing Amplitude: 2.375
Lead Channel Setting Pacing Pulse Width: 0.8 ms
MDC IDC LEAD IMPLANT DT: 20060509
MDC IDC LEAD IMPLANT DT: 20090417
MDC IDC LEAD LOCATION: 753859
MDC IDC MSMT BATTERY VOLTAGE: 2.9 V
MDC IDC MSMT LEADCHNL LV IMPEDANCE VALUE: 360 Ohm
MDC IDC MSMT LEADCHNL LV PACING THRESHOLD AMPLITUDE: 1.125 V
MDC IDC MSMT LEADCHNL LV PACING THRESHOLD PULSEWIDTH: 0.8 ms
MDC IDC MSMT LEADCHNL RA IMPEDANCE VALUE: 380 Ohm
MDC IDC MSMT LEADCHNL RV SENSING INTR AMPL: 12 mV
MDC IDC PG IMPLANT DT: 20141208
MDC IDC SET LEADCHNL RA PACING AMPLITUDE: 2 V
MDC IDC SET LEADCHNL RV PACING PULSEWIDTH: 0.8 ms
MDC IDC SET LEADCHNL RV SENSING SENSITIVITY: 0.5 mV
MDC IDC STAT BRADY AP VS PERCENT: 1 %
Pulse Gen Serial Number: 7138995

## 2018-05-30 ENCOUNTER — Other Ambulatory Visit (HOSPITAL_COMMUNITY): Payer: Self-pay | Admitting: Internal Medicine

## 2018-06-05 ENCOUNTER — Ambulatory Visit: Payer: Medicare Other | Admitting: Physician Assistant

## 2018-06-11 DIAGNOSIS — Z6826 Body mass index (BMI) 26.0-26.9, adult: Secondary | ICD-10-CM | POA: Diagnosis not present

## 2018-06-11 DIAGNOSIS — R05 Cough: Secondary | ICD-10-CM | POA: Diagnosis not present

## 2018-06-11 DIAGNOSIS — R059 Cough, unspecified: Secondary | ICD-10-CM | POA: Insufficient documentation

## 2018-06-11 DIAGNOSIS — I10 Essential (primary) hypertension: Secondary | ICD-10-CM | POA: Diagnosis not present

## 2018-06-11 DIAGNOSIS — J449 Chronic obstructive pulmonary disease, unspecified: Secondary | ICD-10-CM | POA: Diagnosis not present

## 2018-06-11 DIAGNOSIS — I509 Heart failure, unspecified: Secondary | ICD-10-CM | POA: Diagnosis not present

## 2018-06-12 ENCOUNTER — Encounter: Payer: Medicare Other | Admitting: Nurse Practitioner

## 2018-06-18 ENCOUNTER — Other Ambulatory Visit (HOSPITAL_COMMUNITY): Payer: Self-pay | Admitting: Internal Medicine

## 2018-06-19 ENCOUNTER — Encounter (INDEPENDENT_AMBULATORY_CARE_PROVIDER_SITE_OTHER): Payer: Self-pay | Admitting: Family Medicine

## 2018-06-19 ENCOUNTER — Ambulatory Visit (INDEPENDENT_AMBULATORY_CARE_PROVIDER_SITE_OTHER): Payer: Medicare Other

## 2018-06-19 ENCOUNTER — Ambulatory Visit (INDEPENDENT_AMBULATORY_CARE_PROVIDER_SITE_OTHER): Payer: Medicare Other | Admitting: Family Medicine

## 2018-06-19 DIAGNOSIS — M5441 Lumbago with sciatica, right side: Secondary | ICD-10-CM

## 2018-06-19 DIAGNOSIS — M25551 Pain in right hip: Secondary | ICD-10-CM

## 2018-06-19 NOTE — Progress Notes (Signed)
Office Visit Note   Patient: [name]           Date of Birth: 10/20/1931           MRN: [id number] Visit Date: 06/19/2018 Requested by: [name], MD [address] [place], [place] [address] PCP: [name], MD  Subjective: Chief Complaint  Patient presents with  . Right Hip - Pain    Pain in right groin, buttock and down the right leg at times. No known injury.    HPI: He is an 84 year old with right hip and leg pain.  Symptoms started a week ago with no injury.  Pain in the posterior hip/buttock area, with occasional pain in the groin and anterior thigh.  Sometimes the pain is below the knee.  It is best when lying down and taking Tylenol, but it does not go away completely.  Denies any numbness or weakness in his leg.  He has a history of cardiac disease ischemic cardiomyopathy and atrial fibrillation.  He is on anticoagulants, amiodarone and other meds, and has a Investment banker, corporate.  He also has chronic kidney disease stage IV.               ROS: He is not diabetic.  He has a history of bladder cancer.  Other systems were negative.  Objective: Vital Signs: There were no vitals taken for this visit.  Physical Exam:  Back: No rash on his skin.  No tenderness over the lumbar spinous processes.  He has moderate tenderness in the right sciatic notch.  No pain at the greater trochanter.  Mild pain with passive hip flexion and internal rotation but he still has good range of motion.  5/5 strength with hip flexion, knee extension, ankle dorsiflexion, eversion, and plantar flexion.  2+ knee and ankle DTRs.  He has no palpable inguinal hernia.  Imaging: 2 view lumbar x-rays: No sign of compression fracture or neoplasm.  He has moderate diffuse degenerative disc disease with calcification of the abdominal aorta.  2 view x-rays right hip: Well-preserved joint space, very minimal arthritic change.  No sign of stress fracture.   Assessment & Plan: 1.  Right posterior  and anterior hip pain, suspect lumbar radiculopathy.  Neurologic exam is nonfocal. -I think he would benefit from physical therapy.  I will also ask his cardiologist and PCP whether it is okay to treat with short course of steroid.   Follow-Up Instructions: No follow-ups on file.     Procedures: None today.   PMFS History: Patient Active Problem List   Diagnosis Date Noted  . Enterococcal bacteremia 12/08/2017  . Nausea & vomiting 12/06/2017  . Renal insufficiency 12/06/2017  . Acute respiratory failure with hypoxia ([place]) 09/10/2016  . Acute bronchitis with COPD ([place]) 09/10/2016  . CKD (chronic kidney disease) stage 4, GFR 15-29 ml/min (HCC) 04/04/2015  . 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 Urosepsis 08/16/2013  . Bruising 08/12/2012  . Bladder cancer ([place]) 07/08/2012  . UTI (urinary tract infection) 05/27/2012  . Bladder tumor 05/18/2012  . Knee pain, acute, right 02/11/2012  . CAROTID ARTERY DISEASE 12/06/2010  . Essential hypertension, benign 09/22/2009  . Chronic systolic CHF (congestive heart failure) (HCC)-EF 25% 09/14/2009  . CHEST PAIN 08/08/2009  . Mixed hyperlipidemia 05/25/2009  . CAD, NATIVE VESSEL 02/01/2009  . Ischemic cardiomyopathy 01/24/2009  . Atrial fibrillation ([place]) 01/21/2009  . ICD (implantable cardioverter-defibrillator) in place (St Jude pacer/ICD) 01/21/2009   Past Medical History:  Diagnosis Date  . Acute bronchitis with COPD ([place]) 09/10/2016  . Anemia   . Atrial fibrillation or flutter    maintaining sinus on amiodarone    . Bladder cancer ([place]) dx'd 05/2012  . BPH (benign prostatic hyperplasia)   . CAD (coronary artery disease)     a. s/p anterior MI 12/05 c/b shock -> stent LAD   b. s/p stenting OM-1, 2/06  . CHF (congestive heart failure) (HCC)    due to ischemic CM  a. EF 20-30%. (Nov 2008)   b. s/p St. Jude BiV-ICD    c. CPX 07/2008  pvo2 16.3 (63% predicted) slope 34 RER 1.08 O2 pulse 93%  .  Cough    occasional /productive, no fever/ not new  . CRI (chronic renal insufficiency)    (baseline 2.0-2.2)/ recent hospitalization 8/13  . GERD (gastroesophageal reflux disease)    hx Barretts esophagitis  . History of blood transfusion    following coumadin  usage  . HTN (hypertension)    EKG 05/14/12,Chest x ray 8/13, last ICD interrogation 8/13 EPIC  . Hyperlipidemia   . Hypothyroidism   . 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 Neuromuscular disorder ([place])    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"   no neurologist  . Skin cancer    basal cells   facial x 4, 1 right forearm  . Sleep apnea    STOP BANG SCORE 4    Family History  Problem Relation Age of Onset  . Coronary artery disease Unknown   . Stroke Unknown     Past Surgical History:  Procedure Laterality Date  . [place]   lower back  . CARDIAC DEFIBRILLATOR PLACEMENT     ICD St Jude; gen change 09-20-13  . CERVICAL LAMINECTOMY  1985  . COLONOSCOPY    . CORONARY ANGIOPLASTY  2005,2006  . CYSTOSCOPY W/ RETROGRADES  05/25/2012   Procedure: CYSTOSCOPY WITH RETROGRADE PYELOGRAM;  Surgeon: Molli Hazard, MD;  Location: [facility];  Service: Urology;  Laterality: Bilateral;  . CYSTOSCOPY W/ URETERAL STENT PLACEMENT  07/08/2012   Procedure: CYSTOSCOPY WITH STENT REPLACEMENT;  Surgeon: Molli Hazard, MD;  Location: [facility];  Service: Urology;  Laterality: Left;  . CYSTOSCOPY W/ URETERAL STENT REMOVAL  07/08/2012   Procedure: CYSTOSCOPY WITH STENT REMOVAL;  Surgeon: Molli Hazard, MD;  Location: [facility];  Service: Urology;  Laterality: Bilateral;  . CYSTOSCOPY/RETROGRADE/URETEROSCOPY  07/08/2012   Procedure: CYSTOSCOPY/RETROGRADE/URETEROSCOPY;  Surgeon: Molli Hazard, MD;  Location: [facility];  Service: Urology;  Laterality: Left;  . ESOPHAGOGASTRODUODENOSCOPY    . HERNIA REPAIR  1989   bilateral  . IMPLANTABLE CARDIOVERTER DEFIBRILLATOR (ICD) GENERATOR CHANGE N/A  09/20/2013   Procedure: ICD GENERATOR CHANGE;  Surgeon: Deboraha Sprang, MD;  [facility] C[facility] LAB;  Service: Cardiovascular;  Laterality: N/A;  . TEE WITHOUT CARDIOVERSION N/A 12/10/2017   Procedure: TRANSESOPHAGEAL ECHOCARDIOGRAM (TEE);  Surgeon: Jerline Pain, MD;  Location: [facility] ENDOSCOPY;  Service: Cardiovascular;  Laterality: N/A;  . TRANSURETHRAL RESECTION OF BLADDER TUMOR  05/25/2012   cold cup biopsy prostate  . TRANSURETHRAL RESECTION OF BLADDER TUMOR  07/08/2012   Procedure: TRANSURETHRAL RESECTION OF BLADDER TUMOR (TURBT);  Surgeon: Molli Hazard, MD;  Location: [facility];  Service: Urology;  Laterality: N/A;  CYSTO, TURBT W/ GYRUS, BILATERAL STENT REMOVAL, LEFT URETEROSCOPY WITH BIOPSY, POSSIBLE LEFT STENT PLACEMENT    Social History   Occupational History  . Occupation: retired  Tobacco Use  . Smoking status: Former Smoker    Types: Cigarettes, Cigars    Last attempt to quit: 03/02/2004    Years since quitting: 14.3  . Smokeless tobacco: Former Systems developer    Quit date: 05/20/1999  . Tobacco comment: quit in 2005  Substance and Sexual Activity  . Alcohol use: No  . Drug use: No  . Sexual activity: Not on file

## 2018-06-22 ENCOUNTER — Telehealth (INDEPENDENT_AMBULATORY_CARE_PROVIDER_SITE_OTHER): Payer: Self-pay | Admitting: Family Medicine

## 2018-06-22 DIAGNOSIS — M5441 Lumbago with sciatica, right side: Secondary | ICD-10-CM

## 2018-06-22 MED ORDER — METHYLPREDNISOLONE 4 MG PO TBPK: ORAL_TABLET | ORAL | 0 refills | Status: DC

## 2018-06-22 NOTE — Telephone Encounter (Signed)
Advised patient's wife that the medication has been sent to the pharmacy and the physical therapy office should be calling them to schedule an appointment.

## 2018-06-22 NOTE — Telephone Encounter (Signed)
-----   Message from [name], MD sent at 06/20/2018 11:08 AM EDT ----- Regarding: RE: PT and meds He has done and tolerated steroids in the past. In fact, he tolerated a few days of steroids at the end of August - so this is OK and acceptable. As far as the Tramadol - He has not taken this in the past - potentially could be used with caution but concerned about SEs.  [name]  ----- Message ----- From: Eunice Blase, MD Sent: 06/19/2018  12:02 PM EDT To: Jolaine Artist, MD, [name], MD Subject: PT and meds                                    Patient asked me to consult with the two of you to see what you think of referring him to physical therapy for his right hip/leg pain, and also whether it's ok to treat with a Medrol dose pack or possibly tramadol for pain.  Thanks! [name]

## 2018-06-22 NOTE — Telephone Encounter (Signed)
Will call in oral steroid and request referral to physical therapy.

## 2018-06-24 NOTE — Progress Notes (Signed)
Cardiology Office Note Date:  06/24/2018  Patient ID:  [name], [name] 25-Oct-1931, MRN [id number] PCP:  [name], MD  Cardiologist/CHF: Dr. Haroldine Laws Electrophysiologist: Dr. Caryl Comes Nephrology: Dr. Joelyn Oms    Chief Complaint: past due EP/device visit  History of Present Illness: [name] is a 84 y.o. male with history of CAD/PCI, chronic CHF (systolic), ICD w/ICD, PAF (GIB on warfarin) on Eliquis, CRI (IV), HTN, HLD, tremor, bladder cancer treated.    I saw him 06/2017 for EP service, he was accompanied by his wife.  He reported me he is feeling well, denied any CP, palpitations or SOB, no dizziness, near syncope or syncope.  He denied symptoms of orthopnea or PND, stated being very active in/outside of his home.  He had some baseline DOE unchanged for years, says his yard as a incline and when walked out to the fence and coming back is uphill he will get winded though recovers quickly.  He denied DOE on flat surfaces.  He denied any bleeding or signs of bleeding.  He reportd he sees his PMD Q4 months, labs Q 6 months.  He has had a couple of hospitalizations this year, Jan 2019 with URI/CHF exacerbation associated with AKI/CKD, his ARB held In Feb admitted with E faecalis bacteremia from urinary source, TTE and TEE were neg for obvious vegetations, his f/u BC were neg x5 days (x2).  Most recently saw Dr. Haroldine Laws in June 2019, titrated his amiodarone back to 100mg  QD 2/2 worse tremors.  Appeared had gotten increased during one of his stays.  Still no ARB with lower BP and stage IV renal disease.  He dnies any CP, palpitations or SOB.  No DOE or symptoms or PND or orthopnea.  He does not feel like he has had any fluid retention.  No dizziness, near syncope or syncope.  He comes today walking with a walker, this he states 2/2 to a pinched nerve after trying to lift up a oil stove/heater.  He is finishing up his second medrol dose pack, reports the pain down his hip/leg is  finally tolerable.  He c/w the tremor, unchanged despite 3 mo on lowered amio dose.  He says some days the tremor is so bad is difficult to eat.  Not ar test, reports a couple years back saw a neurologist, dx w/essential tremor.  His wife does recall historically at the lowered amio dose it did get better but since he was last in the hospital worse again, and not better this time with smaller dose.  He denies any bleeding or signs of bleeding   Device information: SJM CRT-D, implanted 02/20/07, LV lead 2009, gen change 09/21/13, Dr. Caryl Comes Dr. Caryl Comes noted, device programed to exclude the proximal ring on the LV lead.  AAD tx: amiodarone for AF, was down titrated 2/2 tremors that resolved on lower dose  Past Medical History:  Diagnosis Date  . Acute bronchitis with COPD ([place]) 09/10/2016  . Anemia   . Atrial fibrillation or flutter    maintaining sinus on amiodarone    . Bladder cancer ([place]) dx'd 05/2012  . BPH (benign prostatic hyperplasia)   . CAD (coronary artery disease)     a. s/p anterior MI 12/05 c/b shock -> stent LAD   b. s/p stenting OM-1, 2/06  . CHF (congestive heart failure) (H[facility])    due to ischemic CM  a. EF 20-30%. (Nov 2008)   b. s/p St. Jude BiV-ICD    c. CPX 07/2008  pvo2 16.3 (63% predicted) slope 34 RER 1.08 O2 pulse 93%  . Cough    occasional /productive, no fever/ not new  . CRI (chronic renal insufficiency)    (baseline 2.0-2.2)/ recent hospitalization 8/13  . GERD (gastroesophageal reflux disease)    hx Barretts esophagitis  . History of blood transfusion    following coumadin  usage  . HTN (hypertension)    EKG 05/14/12,Chest x ray 8/13, last ICD interrogation 8/13 EPIC  . Hyperlipidemia   . Hypothyroidism   . 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 Skin cancer    basal cells   facial x 4, 1 right forearm  . Sleep apnea    STOP BANG SCORE 4      Past Surgical History:  Procedure Laterality Date  . [place]   lower back  . CARDIAC DEFIBRILLATOR PLACEMENT     ICD St Jude; gen change 09-20-13  . CERVICAL LAMINECTOMY  1985  . COLONOSCOPY    . CORONARY ANGIOPLASTY  2005,2006  . CYSTOSCOPY W/ RETROGRADES  05/25/2012   Procedure: CYSTOSCOPY WITH RETROGRADE PYELOGRAM;  Surgeon: Molli Hazard, MD;  Location: [facility];  Service: Urology;  Laterality: Bilateral;  . CYSTOSCOPY W/ URETERAL STENT PLACEMENT  07/08/2012   Procedure: CYSTOSCOPY WITH STENT REPLACEMENT;  Surgeon: Molli Hazard, MD;  Location: [facility];  Service: Urology;  Laterality: Left;  . CYSTOSCOPY W/ URETERAL STENT REMOVAL  07/08/2012   Procedure: CYSTOSCOPY WITH STENT REMOVAL;  Surgeon: Molli Hazard, MD;  Location: [facility];  Service: Urology;  Laterality: Bilateral;  . CYSTOSCOPY/RETROGRADE/URETEROSCOPY  07/08/2012   Procedure: CYSTOSCOPY/RETROGRADE/URETEROSCOPY;  Surgeon: Molli Hazard, MD;  Location: [facility];  Service: Urology;  Laterality: Left;  . ESOPHAGOGASTRODUODENOSCOPY    . HERNIA REPAIR  1989   bilateral  . IMPLANTABLE CARDIOVERTER DEFIBRILLATOR (ICD) GENERATOR CHANGE N/A 09/20/2013   Procedure: ICD GENERATOR CHANGE;  Surgeon: Deboraha Sprang, MD;  Location: [facility] CATH LAB;  Service: Cardiovascular;  Laterality: N/A;  . TEE WITHOUT CARDIOVERSION N/A 12/10/2017   Procedure: TRANSESOPHAGEAL ECHOCARDIOGRAM (TEE);  Surgeon: Jerline Pain, MD;  Location: [facility] ENDOSCOPY;  Service: Cardiovascular;  Laterality: N/A;  . TRANSURETHRAL RESECTION OF BLADDER TUMOR  05/25/2012   cold cup biopsy prostate  . TRANSURETHRAL RESECTION OF BLADDER TUMOR  07/08/2012   Procedure: TRANSURETHRAL RESECTION OF BLADDER TUMOR (TURBT);  Surgeon: Molli Hazard, MD;  Location: [facility];  Service: Urology;  Laterality: N/A;  CYSTO, TURBT W/ GYRUS, BILATERAL STENT REMOVAL, LEFT URETEROSCOPY WITH BIOPSY, POSSIBLE LEFT STENT PLACEMENT     Current Outpatient  Medications  Medication Sig Dispense Refill  . acetaminophen (TYLENOL) 325 MG tablet Take 2 tablets (650 mg total) by mouth every 6 (six) hours as needed for mild pain (or Fever >/= 101). (Patient taking differently: Take 1,000 mg by mouth every 6 (six) hours as needed for mild pain or headache. )    . amiodarone (PACERONE) 100 MG tablet TAKE 2 TABLETS ONCE DAILY FOR 14 DAYS. THEN DECREASE TO 1 TABLET ONCE DAILY. 46 tablet 0  . carvedilol (COREG) 6.25 MG tablet TAKE 1 TABLET (6.25MG ) BY MOUTH TWICE DAILY WITH A MEAL 180 tablet 1  . clindamycin (CLEOCIN) 300 MG capsule TAKE 2 CAPSULES BY MOUTH X ONCE 30 MINUTES PRIOR TO PROCEDURE  0  . ELIQUIS 2.5 MG TABS tablet TAKE 1 TABLET TWICE  A DAY 60 tablet 3  . ezetimibe (ZETIA) 10 MG tablet TAKE ONE TABLET BY MOUTH ONCE DAILY WITH SUPPER (Patient taking differently: TAKE ONE TABLET (10mg ) BY MOUTH ONCE DAILY WITH SUPPER) 90 tablet 3  . ferrous sulfate ([facility] IRON) 325 (65 FE) MG tablet Take 325 mg by mouth daily with supper.     . furosemide (LASIX) 20 MG tablet Take 2 tablets (40 mg total) by mouth daily. 180 tablet 2  . levothyroxine (SYNTHROID, LEVOTHROID) 50 MCG tablet Take 25-50 mcg by mouth daily before breakfast. Take 50 mcg (1 tablet) on Tues, Thurs, Sat, Sun and 25 mcg (1/2 tablet) on Mon, Wed, Fri    . Magnesium Hydroxide (MILK OF MAGNESIA PO) Take 20 mLs by mouth daily as needed (for constipation).    . methylPREDNISolone (MEDROL DOSEPAK) 4 MG TBPK tablet As directed for 6 days. 21 tablet 0  . Multiple Vitamins-Minerals (CENTRUM SILVER PO) Take 1 tablet by mouth at bedtime.     . nitroGLYCERIN (NITROSTAT) 0.4 MG SL tablet DISSOLVE ONE TABLET UNDER THE TONGUE EVERY 5 MINUTES AS NEEDED FOR CHEST PAIN.  DO NOT EXCEED A TOTAL OF 3 DOSES IN 15 MINUTES 25 tablet 0  . pantoprazole (PROTONIX) 40 MG tablet Take 40 mg by mouth daily.     . potassium chloride (K-DUR,KLOR-CON) 10 MEQ tablet Take 1 tablet (10 mEq total) by mouth daily. 30 tablet 0  .  rosuvastatin (CRESTOR) 20 MG tablet Take 1 tablet (20 mg total) by mouth daily. (Patient taking differently: Take 20 mg by mouth at bedtime. ) 90 tablet 3  . sodium chloride (OCEAN) 0.65 % SOLN nasal spray Place 1-2 sprays into both nostrils daily as needed for congestion.     . tamsulosin (FLOMAX) 0.4 MG CAPS capsule Take 0.4 mg by mouth at bedtime.     Marland Kitchen tobramycin-dexamethasone (TOBRADEX) ophthalmic solution PLACE 1 DROP INTO THE RIGHT EYE 4 TIMES DAILY FOR 7 DAYS.  1   No current facility-administered medications for this visit.     Allergies:   Ramipril and Penicillins   Social History:  The patient  reports that he quit smoking about 14 years ago. His smoking use included cigarettes and cigars. He quit smokeless tobacco use about 19 years ago. He reports that he does not drink alcohol or use drugs.   Family History:  The patient's family history includes Coronary artery disease in his unknown relative; Stroke in his unknown relative.  ROS:  Please see the history of present illness.  All other systems are reviewed and otherwise negative.   PHYSICAL EXAM:  VS:  There were no vitals taken for this visit. BMI: There is no height or weight on file to calculate BMI. Well nourished, well developed, in no acute distress  HEENT: normocephalic, atraumatic  Neck: no JVD, carotid bruits or masses Cardiac: RRR; no significant murmurs, no rubs, or gallops Lungs: CTA b/l  no wheezing, rhonchi or rales  Abd: soft, nontender MS: no deformity or atrophy Ext:  no edema, left index finger amputated Skin: warm and dry, no rash Neuro:  No gross deficits appreciated Psych: euthymic mood, full affect  ICD site is stable, no tethering or discomfort   EKG:  Not done today ICD interrogation done today by and reviewed by myself: battery and lead measurements are good, 3 AMS episodes all from March, none since. No VT/VF, therapies, about 1.5 estimated battery longevity  TEE: 12/10/2017 - Left  ventricle: Systolic function was severely reduced. The estimated  ejection fraction was in the range of 20% to 25%. Wall motion was normal; there were no regional wall motion abnormalities. - Aortic valve: There was trivial regurgitation. - Mitral valve: There was moderate regurgitation. - Poor images, likely inhibited by moderate hiatal hernia. However, no gross evidence of vegetation.  ECHO: 12/08/2017 - Left ventricle: The cavity size was moderately dilated. Wall thickness was normal. Systolic function was severely reduced. The estimated ejection fraction was in the range of 20% to 25%. Severe diffuse hypokinesis with distinct regional wall motion abnormalities. Dyskinesis and scarring of the apical myocardium. Akinesis and scarring of the anteroseptal and anterior myocardium; consistent with infarction in the distribution of the left anterior descending coronary artery. - Ventricular septum: Septal motion showed abnormal function, dyssynergy, and paradox. These changes are consistent with intraventricular conduction delay. - Aortic valve: There was mild regurgitation. - Mitral valve: There was mild to moderate regurgitation directed centrally. - Left atrium: The atrium was mildly dilated. - Right atrium: The atrium was mildly dilated. - Atrial septum: No defect or patent foramen ovale was identified. Impressions: - There was no evidence of a vegetation.   10/19/15: TTE Study Conclusions - Left ventricle: The cavity size was mildly dilated. Wall   thickness was normal. Systolic function was severely reduced. The   estimated ejection fraction was in the range of 25% to 30%.   Diffuse hypokinesis. There is akinesis of the anteroseptal and   apical myocardium. Doppler parameters are consistent with   abnormal left ventricular relaxation (grade 1 diastolic   dysfunction). - Left atrium: The atrium was mildly dilated. Impressions: - Global  hyokinesis with akinesis of the anteroseptal wall and   apex; grade 1 diastolic dysfunction; mild LAE; trace MR and TR.  PCI: 11/20/2004 PROCEDURE NOTE: A 6-French sheath was placed in the right femoral artery. Angiomax was administered per protocol. We used a 6-French CLS 3.5 guidingcatheter. We initially performed angiography of the left anterior descending artery. This revealed the stent in proximal LAD to be widely patent with proximal 20% stenosis within the mid portion of stent. The diagonal branch was also widely patent. The mid to distal LAD showed mild luminal irregularities. Angiography of the left circumflex revealed a complex 80% stenosis in the proximal portion of the first obtuse marginal branch. An Asahi soft coronary guidewire was advanced under fluoroscopic guidance into the distal aspect of the obtuse marginal branch. We then performed PTCA with a 2.5 x 12 mm Quantum balloon inflated to 10 atmospheres. Following this, we deployed a 2.5 x 13 mm Cypher stent at a deployment pressure of 11 atmospheres. We then went back with our 2.5 x 18mm Quantum balloon and inflated this to 15 atmospheres within the stent. Intermittent doses of intracoronary nitroglycerin were administered. Final angiographic images were obtained showing patency of the obtuse marginal with 0% residual stenosis at the stent site and TIMI III flow. At the conclusion of procedure, an AngioSeal vascular closure device was placed in the right femoral artery with good hemostasis. RESULTS: Successful PTCA placement of a drug-eluting stent in the first obtuse marginal branch. An 80% stenosis was reduced to 0% residual with TIMI III flow.  CATH: 09/18/2004  Coronary angiography Left main has a 20% stenosis. Left anterior descending artery is widely patent at the stent site and the proximal and mid LAD with 0% stenosis within the stent and TIMI III flow into the distal  vessel. The LAD gives rise to a small first diagonal and large second diagonal branch.  The second diagonal branch arises from within the stented segment of the LAD. There is a residual 30% stenosis in the ostium of the second diagonal branch with TIMI III flow into this vessel. Left circumflex is unchanged from before with a 50% stenosis in the ostium and an 80% stenosis in the proximal portion of a large first obtuse marginal branch. IMPRESSION: 1. Elevated left ventricular end diastolic pressure. 2. Patent stent in the left anterior descending artery and patent angioplasty site of the second diagonal branch. 3. No change in disease in the left circumflex as described.   Recent Labs: 12/06/2017: B Natriuretic Peptide 400.9 12/12/2017: Magnesium 2.4 04/09/2018: ALT 103; BUN 37; Creatinine, Ser 2.72; Hemoglobin 13.1; Platelets 198; Potassium 4.6; Sodium 142; TSH 1.221  No results found for requested labs within last 8760 hours.   CrCl cannot be calculated (Patient's most recent lab result is older than the maximum 21 days allowed.).   Wt Readings from Last 3 Encounters:  04/09/18 178 lb (80.7 kg)  12/12/17 189 lb 2.5 oz (85.8 kg)  10/24/17 181 lb 12.8 oz (82.5 kg)     Other studies reviewed: Additional studies/records reviewed today include: summarized above  ASSESSMENT AND PLAN:  1. ICD     Normal function, no changes made  2. Chronic CHF (systolic)     Exam, weight, CorVu do not suggest fluid OL     C/w Dr. Haroldine Laws and AHF team  3. CAD      no anginal complaints     On BB, statin, labs managed with his PMD  4. Paroxysmal AFib     CHA2DS2Vasc is at least 4, on Eliquis (appropriately dosed at 2.5mg  for Creat and age)     Despite lowered amio dose, tremor remains unchanged, he has had a couple falls.     Will stop the amiodarone and see him back in about 10 weeks or so.  If unchanged, may not be the amio, though historically has been felt to be the  culprit.      Discussed he may have AF     Disposition: device remotes as usual, otherwise, as above.  Current medicines are reviewed at length with the patient today.  The patient did not have any concerns regarding medicines.  Haywood Lasso, PA-C 06/24/2018 11:23 AM     [facility] [address] [place] [address] [phone] (office)  [phone] (fax)

## 2018-06-26 ENCOUNTER — Telehealth (INDEPENDENT_AMBULATORY_CARE_PROVIDER_SITE_OTHER): Payer: Self-pay | Admitting: Family Medicine

## 2018-06-26 MED ORDER — METHYLPREDNISOLONE 4 MG PO TBPK: ORAL_TABLET | ORAL | 0 refills | Status: DC

## 2018-06-26 NOTE — Telephone Encounter (Signed)
Notified Mrs. [name] about the Rx that was sent in to [place].  She said [facility] PT called them yesterday and scheduled [name] for 07/06/18.

## 2018-06-26 NOTE — Telephone Encounter (Signed)
Will call in one more round.  Also, has he been contacted to start physical therapy yet?  If not, I will request again.

## 2018-06-26 NOTE — Telephone Encounter (Signed)
Please advise 

## 2018-06-26 NOTE — Telephone Encounter (Signed)
Patients wife, Romie Minus said patient is finishing up his rx for prednisone and he is still in pain. She is wondering if Dr. Junius Roads will refill this or what else patient can do? Please advise # [phone]

## 2018-07-01 ENCOUNTER — Encounter

## 2018-07-01 ENCOUNTER — Ambulatory Visit (INDEPENDENT_AMBULATORY_CARE_PROVIDER_SITE_OTHER): Payer: Medicare Other | Admitting: Physician Assistant

## 2018-07-01 VITALS — BP 126/67 | HR 64 | Ht 70.0 in | Wt 176.0 lb

## 2018-07-01 DIAGNOSIS — I5022 Chronic systolic (congestive) heart failure: Secondary | ICD-10-CM | POA: Diagnosis not present

## 2018-07-01 DIAGNOSIS — I480 Paroxysmal atrial fibrillation: Secondary | ICD-10-CM | POA: Diagnosis not present

## 2018-07-01 DIAGNOSIS — I255 Ischemic cardiomyopathy: Secondary | ICD-10-CM | POA: Diagnosis not present

## 2018-07-01 DIAGNOSIS — I4891 Unspecified atrial fibrillation: Secondary | ICD-10-CM

## 2018-07-01 DIAGNOSIS — I2510 Atherosclerotic heart disease of native coronary artery without angina pectoris: Secondary | ICD-10-CM

## 2018-07-01 DIAGNOSIS — Z95810 Presence of automatic (implantable) cardiac defibrillator: Secondary | ICD-10-CM | POA: Diagnosis not present

## 2018-07-01 NOTE — Patient Instructions (Addendum)
Medication Instructions:   STOP TAKING AMIODARONE   If you need a refill on your cardiac medications before your next appointment, please call your pharmacy.  Labwork: NONE ORDERED  TODAY    Testing/Procedures: NONE ORDERED  TODAY    Follow-Up:  IN 10 TO 12  WEEKS WITH URSUY   Remote monitoring is used to monitor your Pacemaker of ICD from home. This monitoring reduces the number of office visits required to check your device to one time per year. It allows [place] to keep an eye on the functioning of your device to ensure it is working properly. You are scheduled for a device check from home on .07-22-18   You may send your transmission at any time that day. If you have a wireless device, the transmission will be sent automatically. After your physician reviews your transmission, you will receive a postcard with your next transmission date.     Any Other Special Instructions Will Be Listed Below (If Applicable).

## 2018-07-01 NOTE — Addendum Note (Signed)
Addended by: Claude Manges on: 07/01/2018 02:14 PM   Modules accepted: Orders

## 2018-07-06 ENCOUNTER — Ambulatory Visit: Payer: Medicare Other | Attending: Family Medicine

## 2018-07-06 ENCOUNTER — Other Ambulatory Visit: Payer: Self-pay

## 2018-07-06 ENCOUNTER — Ambulatory Visit: Payer: Medicare Other | Admitting: Physical Therapy

## 2018-07-06 DIAGNOSIS — R293 Abnormal posture: Secondary | ICD-10-CM | POA: Diagnosis not present

## 2018-07-06 DIAGNOSIS — M5441 Lumbago with sciatica, right side: Secondary | ICD-10-CM | POA: Diagnosis not present

## 2018-07-06 NOTE — Patient Instructions (Signed)
Lateral shift hips RT x 4-6 reps 4-6 x /day

## 2018-07-06 NOTE — Therapy (Signed)
[place], [place], [address] Phone: [phone]   Fax:  [phone]  Physical Therapy Evaluation  Patient Details  Name: [name] MRN: [id number] Date of Birth: 1932-05-19 Referring Provider: Eunice Blase, MD   Encounter Date: 07/06/2018  PT End of Session - 07/06/18 1452    Visit Number  1    Number of Visits  12    Date for PT Re-Evaluation  08/14/18    Authorization Type  MCR    Authorization Time Period  Progress at visit 10   KX at visit 15    PT Start Time  0250    PT Stop Time  0340    PT Time Calculation (min)  50 min    Activity Tolerance  Patient tolerated treatment well    Behavior During Therapy  [facility] for tasks assessed/performed       Past Medical History:  Diagnosis Date  . Acute bronchitis with COPD ([place]) 09/10/2016  . Anemia   . Atrial fibrillation or flutter    maintaining sinus on amiodarone    . Bladder cancer ([place]) dx'd 05/2012  . BPH (benign prostatic hyperplasia)   . CAD (coronary artery disease)     a. s/p anterior MI 12/05 c/b shock -> stent LAD   b. s/p stenting OM-1, 2/06  . CHF (congestive heart failure) (HCC)    due to ischemic CM  a. EF 20-30%. (Nov 2008)   b. s/p St. Jude BiV-ICD    c. CPX 07/2008  pvo2 16.3 (63% predicted) slope 34 RER 1.08 O2 pulse 93%  . Cough    occasional /productive, no fever/ not new  . CRI (chronic renal insufficiency)    (baseline 2.0-2.2)/ recent hospitalization 8/13  . GERD (gastroesophageal reflux disease)    hx Barretts esophagitis  . History of blood transfusion    following coumadin  usage  . HTN (hypertension)    EKG 05/14/12,Chest x ray 8/13, last ICD interrogation 8/13 EPIC  . Hyperlipidemia   . Hypothyroidism   . 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 Skin cancer    basal cells   facial x 4, 1  right forearm  . Sleep apnea    STOP BANG SCORE 4    Past Surgical History:  Procedure Laterality Date  . [place]   lower back  . CARDIAC DEFIBRILLATOR PLACEMENT     ICD St Jude; gen change 09-20-13  . CERVICAL LAMINECTOMY  1985  . COLONOSCOPY    . CORONARY ANGIOPLASTY  2005,2006  . CYSTOSCOPY W/ RETROGRADES  05/25/2012   Procedure: CYSTOSCOPY WITH RETROGRADE PYELOGRAM;  Surgeon: Molli Hazard, MD;  Location: [facility];  Service: Urology;  Laterality: Bilateral;  . CYSTOSCOPY W/ URETERAL STENT PLACEMENT  07/08/2012   Procedure: CYSTOSCOPY WITH STENT REPLACEMENT;  Surgeon: Molli Hazard, MD;  [facility];  Service: Urology;  Laterality: Left;  . CYSTOSCOPY W/ URETERAL STENT REMOVAL  07/08/2012   Procedure: CYSTOSCOPY WITH STENT REMOVAL;  Surgeon: Molli Hazard, MD;  [facility];  Service: Urology;  Laterality: Bilateral;  . CYSTOSCOPY/RETROGRADE/URETEROSCOPY  07/08/2012   Procedure: CYSTOSCOPY/RETROGRADE/URETEROSCOPY;  Surgeon: Molli Hazard, MD;  [facility];  Service: Urology;  Laterality: Left;  . ESOPHAGOGASTRODUODENOSCOPY    . HERNIA REPAIR  1989   bilateral  . IMPLANTABLE CARDIOVERTER  DEFIBRILLATOR (ICD) GENERATOR CHANGE N/A 09/20/2013   Procedure: ICD GENERATOR CHANGE;  Surgeon: Deboraha Sprang, MD;  Location: [facility] CATH LAB;  Service: Cardiovascular;  Laterality: N/A;  . TEE WITHOUT CARDIOVERSION N/A 12/10/2017   Procedure: TRANSESOPHAGEAL ECHOCARDIOGRAM (TEE);  Surgeon: Jerline Pain, MD;  Location: [facility] ENDOSCOPY;  Service: Cardiovascular;  Laterality: N/A;  . TRANSURETHRAL RESECTION OF BLADDER TUMOR  05/25/2012   cold cup biopsy prostate  . TRANSURETHRAL RESECTION OF BLADDER TUMOR  07/08/2012   Procedure: TRANSURETHRAL RESECTION OF BLADDER TUMOR (TURBT);  Surgeon: Molli Hazard, MD;  [facility];  Service: Urology;  Laterality: N/A;  CYSTO, TURBT W/ GYRUS, BILATERAL STENT REMOVAL, LEFT URETEROSCOPY WITH BIOPSY, POSSIBLE  LEFT STENT PLACEMENT     There were no vitals filed for this visit.   Subjective Assessment - 07/06/18 1456    Subjective  He reports leg weakness and pain RT hip and RT leg at times. Most pain with sitting.    MD said nothing wrong so sent to PT . MAy be pinched nerve.  Meds helped alot and he though was cured but lifted object and pain returned and has been worse since that time.   Heat does not help.     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 sepsis earlier in year , back surgery x 2.  Defib and pacemaker.     Limitations  Sitting    How long can you sit comfortably?  varies    How long can you stand comfortably?  not sure    How long can you walk comfortably?  250 feet .     Diagnostic tests  xray: Degenerative changes    Patient Stated Goals  He would like to get rid of pain.     Currently in Pain?  Yes    Pain Score  1     Pain Location  Leg    Pain Orientation  Right    Pain Descriptors / Indicators  Aching;Dull    Pain Type  Acute pain    Pain Onset  More than a month ago    Pain Frequency  Intermittent    Aggravating Factors   sitting    Pain Relieving Factors  lying down.           [facility] PT Assessment - 07/06/18 0001      Assessment   Medical Diagnosis  RT back pain     Referring Provider  Eunice Blase, MD    Onset Date/Surgical Date  --   3 weeks ago   Next MD Visit  As needed    Prior Therapy  no even after 2 surgeries to spine.       Precautions   Precautions  None      Restrictions   Weight Bearing Restrictions  No      Balance Screen   Has the patient fallen in the past 6 months  Yes    How many times?  2   slipped on rack, spetic earlier in year and balance now decr     Cognition   Overall Cognitive Status  Within Functional Limits for tasks assessed      Observation/Other Assessments   Focus on Therapeutic Outcomes (FOTO)   54% limited       Posture/Postural Control   Posture Comments  RT lateral shift of trunks, flat lumbar spine and forward  head.       ROM / Strength   AROM / PROM / Strength  AROM;Strength      AROM   AROM Assessment Site  Lumbar    Lumbar Flexion  35    Lumbar Extension  10    Lumbar - Right Side Bend  10      Strength   Overall Strength Comments  WFL LE       Flexibility   Soft Tissue Assessment /Muscle Length  yes    Hamstrings  60 degrees bilaterally negative.     Quadriceps  decr  bilaterally    ITB  tight bilaterally                Objective measurements completed on examination: See above findings.      [facility] Adult PT Treatment/Exercise - 07/06/18 0001      Exercises   Exercises  Lumbar      Lumbar Exercises: Stretches   Other Lumbar Stretch Exercise  lateral glide hips to RT at wall              PT Education - 07/06/18 1545    Education Details  POC HEP    Person(s) Educated  Patient;Spouse    Methods  Explanation;Demonstration;Tactile cues;Verbal cues;Handout    Comprehension  Verbalized understanding;Returned demonstration       PT Short Term Goals - 07/06/18 1558      PT SHORT TERM GOAL #1   Title  He will be independent with initial HEp    Time  2    Period  Weeks    Status  New      PT SHORT TERM GOAL #2   Title  He will report pain in RT leg decr 25% or more    Time  3    Period  Weeks    Status  New        PT Long Term Goals - 07/06/18 1559      PT LONG TERM GOAL #1   Title  He will be independent with all hEp issued     Time  6    Period  Weeks    Status  New      PT LONG TERM GOAL #2   Title  He will report pain as intermittant and never above 3/10 with yard work    Time  6    Period  Weeks    Status  New      PT LONG TERM GOAL #3   Title  He will demo understanding of lifting mechanics  and limitations for lifting    Time  6    Period  Weeks    Status  New      PT LONG TERM GOAL #4   Title  He will be able to walk as  needed around property without increased pain.     Time  6    Period  Weeks    Status  New      PT LONG TERM GOAL #5   Title  He will be able to sit for 45-60 min  without incr pain    Time  6    Period  Weeks    Status  New             Plan - 07/06/18 1546    Clinical Impression Statement  [name] presents with acute onset of RT back and leg pain. that tends to be worse with  sitting but also with prolonged walking He has a RT lateral trunk shift and flat lumbar spine with slightly shorter Rt leg.  His balance is decreased and this has been since septic episode earlier this year .  He also has significant cardiac issues with pacemaker and debibrillator.    SLR was negative and movelents of back today did not increase pain.   He has stiffness of both hips and spine and but these movements did not illicite symptoms.   He should make some improvenments with PT .     History and Personal Factors relevant to plan of care:  back surery x2 , cardiac issues , abnormal posture,     Clinical Presentation  Evolving    Clinical Presentation due to:  acute back and leg pain     Clinical Decision Making  Moderate    Rehab Potential  Good    PT Frequency  2x / week    PT Duration  6 weeks    PT Treatment/Interventions  Passive range of motion;Dry needling;Manual techniques;Patient/family education;Therapeutic activities;Therapeutic exercise;Moist Heat;Cryotherapy;Traction;Ultrasound    PT Next Visit Plan  manual for ROM , stretching , modalities as neeedd ( PACEMAKERAND DEFIBRILATOR No ES) core strength assess heel lift    PT Home Exercise Plan  lateal shift in standing    Consulted and Agree with Plan of Care  Patient       Patient will benefit from skilled therapeutic intervention in order to improve the following deficits and impairments:  Pain, Postural dysfunction, Increased muscle spasms, Decreased activity tolerance, Difficulty walking, Decreased range of motion  Visit  Diagnosis: Right-sided low back pain with right-sided sciatica, unspecified chronicity - Plan: PT plan of care cert/re-cert  Abnormal posture - Plan: PT plan of care cert/re-cert     Problem List Patient Active Problem List   Diagnosis Date Noted  . Enterococcal bacteremia 12/08/2017  . Nausea & vomiting 12/06/2017  . Renal insufficiency 12/06/2017  . Acute respiratory failure with hypoxia ([place]) 09/10/2016  . Acute bronchitis with COPD ([place]) 09/10/2016  . CKD (chronic kidney disease) stage 4, GFR 15-29 ml/min (HCC) 04/04/2015  . 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 Urosepsis 08/16/2013  . Bruising 08/12/2012  . Bladder cancer ([place]) 07/08/2012  . UTI (urinary tract infection) 05/27/2012  . Bladder tumor 05/18/2012  . Knee pain, acute, right 02/11/2012  . CAROTID ARTERY DISEASE 12/06/2010  . Essential hypertension, benign 09/22/2009  . Chronic systolic CHF (congestive heart failure) (HCC)-EF 25% 09/14/2009  . CHEST PAIN 08/08/2009  . Mixed hyperlipidemia 05/25/2009  . CAD, NATIVE VESSEL 02/01/2009  . Ischemic cardiomyopathy 01/24/2009  . Atrial fibrillation ([place]) 01/21/2009  . ICD (implantable cardioverter-defibrillator) in place [facility] Jude pacer/ICD) 01/21/2009    Darrel Hoover  PT 07/06/2018, 4:13 PM  [facility] [address] [place], [place], [address] Phone: [phone]   Fax:  [phone]  Name: [name] MRN: [id number] Date of Birth: [birth year]

## 2018-07-08 ENCOUNTER — Ambulatory Visit: Payer: Medicare Other | Admitting: Physical Therapy

## 2018-07-08 DIAGNOSIS — M5441 Lumbago with sciatica, right side: Secondary | ICD-10-CM | POA: Diagnosis not present

## 2018-07-08 DIAGNOSIS — R293 Abnormal posture: Secondary | ICD-10-CM

## 2018-07-08 NOTE — Therapy (Signed)
[place], [place], [address] Phone: [phone]   Fax:  [phone]  Physical Therapy Treatment  Patient Details  Name: [name] MRN: [id number] Date of Birth: 1932/03/15 Referring Provider: Eunice Blase, MD   Encounter Date: 07/08/2018  PT End of Session - 07/08/18 1458    Visit Number  2    Number of Visits  12    Date for PT Re-Evaluation  08/14/18    Authorization Type  MCR    Authorization Time Period  Progress at visit 10   KX at visit 15    PT Start Time  0215    PT Stop Time  0305    PT Time Calculation (min)  50 min    Activity Tolerance  Patient tolerated treatment well    Behavior During Therapy  [facility] for tasks assessed/performed       Past Medical History:  Diagnosis Date  . Acute bronchitis with COPD ([place]) 09/10/2016  . Anemia   . Atrial fibrillation or flutter    maintaining sinus on amiodarone    . Bladder cancer ([place]) dx'd 05/2012  . BPH (benign prostatic hyperplasia)   . CAD (coronary artery disease)     a. s/p anterior MI 12/05 c/b shock -> stent LAD   b. s/p stenting OM-1, 2/06  . CHF (congestive heart failure) (HCC)    due to ischemic CM  a. EF 20-30%. (Nov 2008)   b. s/p St. Jude BiV-ICD    c. CPX 07/2008  pvo2 16.3 (63% predicted) slope 34 RER 1.08 O2 pulse 93%  . Cough    occasional /productive, no fever/ not new  . CRI (chronic renal insufficiency)    (baseline 2.0-2.2)/ recent hospitalization 8/13  . GERD (gastroesophageal reflux disease)    hx Barretts esophagitis  . History of blood transfusion    following coumadin  usage  . HTN (hypertension)    EKG 05/14/12,Chest x ray 8/13, last ICD interrogation 8/13 EPIC  . Hyperlipidemia   . Hypothyroidism   . 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 Skin cancer    basal cells   facial x 4, 1  right forearm  . Sleep apnea    STOP BANG SCORE 4    Past Surgical History:  Procedure Laterality Date  . [place]   lower back  . CARDIAC DEFIBRILLATOR PLACEMENT     ICD St Jude; gen change 09-20-13  . CERVICAL LAMINECTOMY  1985  . COLONOSCOPY    . CORONARY ANGIOPLASTY  2005,2006  . CYSTOSCOPY W/ RETROGRADES  05/25/2012   Procedure: CYSTOSCOPY WITH RETROGRADE PYELOGRAM;  Surgeon: Molli Hazard, MD;  [facility];  Service: Urology;  Laterality: Bilateral;  . CYSTOSCOPY W/ URETERAL STENT PLACEMENT  07/08/2012   Procedure: CYSTOSCOPY WITH STENT REPLACEMENT;  Surgeon: Molli Hazard, MD;  [facility];  Service: Urology;  Laterality: Left;  . CYSTOSCOPY W/ URETERAL STENT REMOVAL  07/08/2012   Procedure: CYSTOSCOPY WITH STENT REMOVAL;  Surgeon: Molli Hazard, MD;  [facility];  Service: Urology;  Laterality: Bilateral;  . CYSTOSCOPY/RETROGRADE/URETEROSCOPY  07/08/2012   Procedure: CYSTOSCOPY/RETROGRADE/URETEROSCOPY;  Surgeon: Molli Hazard, MD;  [facility];  Service: Urology;  Laterality: Left;  . ESOPHAGOGASTRODUODENOSCOPY    . HERNIA REPAIR  1989   bilateral  . IMPLANTABLE CARDIOVERTER  DEFIBRILLATOR (ICD) GENERATOR CHANGE N/A 09/20/2013   Procedure: ICD GENERATOR CHANGE;  Surgeon: Deboraha Sprang, MD;  Location: [facility] CATH LAB;  Service: Cardiovascular;  Laterality: N/A;  . TEE WITHOUT CARDIOVERSION N/A 12/10/2017   Procedure: TRANSESOPHAGEAL ECHOCARDIOGRAM (TEE);  Surgeon: Jerline Pain, MD;  Location: [facility] ENDOSCOPY;  Service: Cardiovascular;  Laterality: N/A;  . TRANSURETHRAL RESECTION OF BLADDER TUMOR  05/25/2012   cold cup biopsy prostate  . TRANSURETHRAL RESECTION OF BLADDER TUMOR  07/08/2012   Procedure: TRANSURETHRAL RESECTION OF BLADDER TUMOR (TURBT);  Surgeon: Molli Hazard, MD;  [facility];  Service: Urology;  Laterality: N/A;  CYSTO, TURBT W/ GYRUS, BILATERAL STENT REMOVAL, LEFT URETEROSCOPY WITH BIOPSY, POSSIBLE  LEFT STENT PLACEMENT     There were no vitals filed for this visit.  Subjective Assessment - 07/08/18 1434    Subjective  Pt relays his back is not bothering him but his Rt leg still is, pain comes and goes now. He reports compliance with HEP but does cant tell it is doing anything. He relays heel lift is aggravating him but he was encouraged to try it for one week.     Currently in Pain?  Yes    Pain Score  1     Pain Location  Leg    Pain Orientation  Right    Pain Descriptors / Indicators  Aching                       [facility] Adult PT Treatment/Exercise - 07/08/18 0001      Exercises   Exercises  Lumbar      Lumbar Exercises: Stretches   Passive Hamstring Stretch  Right;2 reps;30 seconds    Single Knee to Chest Stretch  Right;Left;2 reps;30 seconds    ITB Stretch  Right;2 reps;30 seconds    Piriformis Stretch  Right;2 reps;30 seconds    Other Lumbar Stretch Exercise  lateral glide hips to RT at wall 2X10      Lumbar Exercises: Aerobic   Nustep  5 min      Lumbar Exercises: Supine   Clam  20 reps    Clam Limitations  yellow      Modalities   Modalities  Moist Heat      Moist Heat Therapy   Number Minutes Moist Heat  7 Minutes    Moist Heat Location  Hip;Lumbar Spine      Manual Therapy   Manual therapy comments  PROM Rt hip, LE distraction Rt, Rt hip mobs inf, PROM flex, IR, ER, STM with tennis ball to lumbar, Rt glutes, IT band               PT Short Term Goals - 07/06/18 1558      PT SHORT TERM GOAL #1   Title  He will be independent with initial HEp    Time  2    Period  Weeks    Status  New      PT SHORT TERM GOAL #2   Title  He will report pain in RT leg decr 25% or more    Time  3    Period  Weeks    Status  New        PT Long Term Goals - 07/06/18 1559      PT LONG TERM GOAL #1   Title  He will be independent with all hEp issued     Time  6    Period  Weeks    Status  New      PT LONG TERM GOAL #2   Title  He will  report pain as intermittant and never above 3/10 with yard work    Time  6    Period  Weeks    Status  New      PT LONG TERM GOAL #3   Title  He will demo understanding of lifting mechanics  and limitations for lifting    Time  6    Period  Weeks    Status  New      PT LONG TERM GOAL #4   Title  He will be able to walk as needed around property without increased pain.     Time  6    Period  Weeks    Status  New      PT LONG TERM GOAL #5   Title  He will be able to sit for 45-60 min  without incr pain    Time  6    Period  Weeks    Status  New            Plan - 07/08/18 1459    Clinical Impression Statement  Pt was trialed with MT for PROM, hip mobs, LE distraction and STM, He did not notice much change in his leg however pain was intermittent today. Rt lateral shifting of trunk was continued as she still has lateral truck shift present.     Rehab Potential  Good    PT Frequency  2x / week    PT Duration  6 weeks    PT Next Visit Plan  manual for ROM , stretching , modalities as neeedd ( PACEMAKERAND DEFIBRILATOR No ES) core strength assess heel lift       Patient will benefit from skilled therapeutic intervention in order to improve the following deficits and impairments:  Pain, Postural dysfunction, Increased muscle spasms, Decreased activity tolerance, Difficulty walking, Decreased range of motion  Visit Diagnosis: Right-sided low back pain with right-sided sciatica, unspecified chronicity  Abnormal posture     Problem List Patient Active Problem List   Diagnosis Date Noted  . Enterococcal bacteremia 12/08/2017  . Nausea & vomiting 12/06/2017  . Renal insufficiency 12/06/2017  . Acute respiratory failure with hypoxia ([place]) 09/10/2016  . Acute bronchitis with COPD ([place]) 09/10/2016  . CKD (chronic kidney disease) stage 4, GFR 15-29 ml/min (HCC) 04/04/2015  . 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 Urosepsis 08/16/2013  .  Bruising 08/12/2012  . Bladder cancer ([place]) 07/08/2012  . UTI (urinary tract infection) 05/27/2012  . Bladder tumor 05/18/2012  . Knee pain, acute, right 02/11/2012  . CAROTID ARTERY DISEASE 12/06/2010  . Essential hypertension, benign 09/22/2009  . Chronic systolic CHF (congestive heart failure) (HCC)-EF 25% 09/14/2009  . CHEST PAIN 08/08/2009  . Mixed hyperlipidemia 05/25/2009  . CAD, NATIVE VESSEL 02/01/2009  . Ischemic cardiomyopathy 01/24/2009  . Atrial fibrillation ([place]) 01/21/2009  . ICD (implantable cardioverter-defibrillator) in place [facility] Jude pacer/ICD) 01/21/2009    Debbe Odea. PT, DPT 07/08/2018, 3:10 PM  [facility] [address] [place], [place], [address] Phone: [phone]   Fax:  [phone]  Name: JOS CYGAN MRN: [id number] Date of Birth: [birth year]

## 2018-07-10 ENCOUNTER — Encounter: Payer: Self-pay | Admitting: Podiatry

## 2018-07-10 ENCOUNTER — Ambulatory Visit (INDEPENDENT_AMBULATORY_CARE_PROVIDER_SITE_OTHER): Payer: Medicare Other | Admitting: Podiatry

## 2018-07-10 DIAGNOSIS — L84 Corns and callosities: Secondary | ICD-10-CM

## 2018-07-10 DIAGNOSIS — M79675 Pain in left toe(s): Secondary | ICD-10-CM | POA: Diagnosis not present

## 2018-07-10 DIAGNOSIS — B351 Tinea unguium: Secondary | ICD-10-CM | POA: Diagnosis not present

## 2018-07-10 DIAGNOSIS — Z9229 Personal history of other drug therapy: Secondary | ICD-10-CM

## 2018-07-10 DIAGNOSIS — M79674 Pain in right toe(s): Secondary | ICD-10-CM | POA: Diagnosis not present

## 2018-07-14 ENCOUNTER — Ambulatory Visit: Payer: Medicare Other | Attending: Family Medicine | Admitting: Physical Therapy

## 2018-07-14 ENCOUNTER — Other Ambulatory Visit: Payer: Self-pay

## 2018-07-14 DIAGNOSIS — R293 Abnormal posture: Secondary | ICD-10-CM | POA: Diagnosis not present

## 2018-07-14 DIAGNOSIS — Z23 Encounter for immunization: Secondary | ICD-10-CM | POA: Diagnosis not present

## 2018-07-14 DIAGNOSIS — M5441 Lumbago with sciatica, right side: Secondary | ICD-10-CM | POA: Diagnosis not present

## 2018-07-14 NOTE — Progress Notes (Signed)
Subjective: [name] is an 84 y.o. y.o. male who presents today for follow up. He is seen for painful, discolored, thick toenails. Pain is aggravated when wearing enclosed shoe gear and is relieved with periodic professional debridement.  He is on  long term blood thinner, Eliquis.  Objective: Vascular Examination: Capillary refill time <3 seconds x 10 digits Dorsalis pedis pulses and Posterior tibial pulses present b/l No digital hair x 10 digits Skin temperature WNL b/l  Dermatological Examination: Turgor, texture and tone normal b/l LE Toenails 1-5 b/l discolored, thick, dystrophic with subungual debris and pain with palpation to nailbeds due to thickness of nails.  Spine callus noted submetatarsal head 1 b/l  Musculoskeletal: Muscle strength 5/5 to all LE muscle groups Hammertoes 2-5 b/l  Neurological: Sensation intact with 10 gram monofilament. Vibratory sensation intact.  Assessment: 1. Painful onychomycosis toenails 1-5 b/l in patient on blood thinner (Eliquis) 2. Callus submet 1 b/l  Plan: 1. Toenails 1-5 b/l were debrided in length and girth without iatrogenic bleeding. 2. Callus submet head 1 b/l debrided mechanically without incident 3. Patient to continue soft, supportive shoe gear 4. Patient to report any pedal injuries to medical professional immediately. 5. Avoid self trimming due to use of blood thinner. 6. Follow up 3 months.  7. Patient/POA to call should there be a concern in the interim.

## 2018-07-14 NOTE — Therapy (Signed)
[place], [place], [address] Phone: [phone]   Fax:  [phone]  Physical Therapy Treatment  Patient Details  Name: [name] MRN: [id number] Date of Birth: Jun 02, 1932 Referring Provider (PT): Eunice Blase, MD   Encounter Date: 07/14/2018  PT End of Session - 07/14/18 1505    Visit Number  3    Number of Visits  12    Date for PT Re-Evaluation  08/14/18    Authorization Type  MCR    Authorization Time Period  Progress at visit 10   KX at visit 15    PT Start Time  1408    PT Stop Time  1454    PT Time Calculation (min)  46 min    Activity Tolerance  Patient tolerated treatment well    Behavior During Therapy  [facility] for tasks assessed/performed       Past Medical History:  Diagnosis Date  . Acute bronchitis with COPD ([place]) 09/10/2016  . Anemia   . Atrial fibrillation or flutter    maintaining sinus on amiodarone    . Bladder cancer ([place]) dx'd 05/2012  . BPH (benign prostatic hyperplasia)   . CAD (coronary artery disease)     a. s/p anterior MI 12/05 c/b shock -> stent LAD   b. s/p stenting OM-1, 2/06  . CHF (congestive heart failure) (HCC)    due to ischemic CM  a. EF 20-30%. (Nov 2008)   b. s/p St. Jude BiV-ICD    c. CPX 07/2008  pvo2 16.3 (63% predicted) slope 34 RER 1.08 O2 pulse 93%  . Cough    occasional /productive, no fever/ not new  . CRI (chronic renal insufficiency)    (baseline 2.0-2.2)/ recent hospitalization 8/13  . GERD (gastroesophageal reflux disease)    hx Barretts esophagitis  . History of blood transfusion    following coumadin  usage  . HTN (hypertension)    EKG 05/14/12,Chest x ray 8/13, last ICD interrogation 8/13 EPIC  . Hyperlipidemia   . Hypothyroidism   . 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 Skin cancer    basal cells   facial x  4, 1 right forearm  . Sleep apnea    STOP BANG SCORE 4    Past Surgical History:  Procedure Laterality Date  . [place]   lower back  . CARDIAC DEFIBRILLATOR PLACEMENT     ICD St Jude; gen change 09-20-13  . CERVICAL LAMINECTOMY  1985  . COLONOSCOPY    . CORONARY ANGIOPLASTY  2005,2006  . CYSTOSCOPY W/ RETROGRADES  05/25/2012   Procedure: CYSTOSCOPY WITH RETROGRADE PYELOGRAM;  Surgeon: Molli Hazard, MD;  Location: [facility];  Service: Urology;  Laterality: Bilateral;  . CYSTOSCOPY W/ URETERAL STENT PLACEMENT  07/08/2012   Procedure: CYSTOSCOPY WITH STENT REPLACEMENT;  Surgeon: Molli Hazard, MD;  [facility];  Service: Urology;  Laterality: Left;  . CYSTOSCOPY W/ URETERAL STENT REMOVAL  07/08/2012   Procedure: CYSTOSCOPY WITH STENT REMOVAL;  Surgeon: Molli Hazard, MD;  [facility];  Service: Urology;  Laterality: Bilateral;  . CYSTOSCOPY/RETROGRADE/URETEROSCOPY  07/08/2012   Procedure: CYSTOSCOPY/RETROGRADE/URETEROSCOPY;  Surgeon: Molli Hazard, MD;  [facility];  Service: Urology;  Laterality: Left;  . ESOPHAGOGASTRODUODENOSCOPY    . [place]   bilateral  . IMPLANTABLE  CARDIOVERTER DEFIBRILLATOR (ICD) GENERATOR CHANGE N/A 09/20/2013   Procedure: ICD GENERATOR CHANGE;  Surgeon: Deboraha Sprang, MD;  [facility] CATH LAB;  Service: Cardiovascular;  Laterality: N/A;  . TEE WITHOUT CARDIOVERSION N/A 12/10/2017   Procedure: TRANSESOPHAGEAL ECHOCARDIOGRAM (TEE);  Surgeon: Jerline Pain, MD;  Location: [facility] ENDOSCOPY;  Service: Cardiovascular;  Laterality: N/A;  . TRANSURETHRAL RESECTION OF BLADDER TUMOR  05/25/2012   cold cup biopsy prostate  . TRANSURETHRAL RESECTION OF BLADDER TUMOR  07/08/2012   Procedure: TRANSURETHRAL RESECTION OF BLADDER TUMOR (TURBT);  Surgeon: Molli Hazard, MD;  [facility];  Service: Urology;  Laterality: N/A;  CYSTO, TURBT W/ GYRUS, BILATERAL STENT REMOVAL, LEFT URETEROSCOPY WITH BIOPSY,  POSSIBLE LEFT STENT PLACEMENT     There were no vitals filed for this visit.  Subjective Assessment - 07/14/18 1502    Subjective  Pt. reports right leg pain has improved with no current leg symptoms this afternoon. Residual pain is local to right lumbar region.    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 sepsis earlier in year , back surgery x 2.  Defib and pacemaker.     Pain Score  2     Pain Location  Back    Pain Orientation  Right                       [facility] Adult PT Treatment/Exercise - 07/14/18 0001      Exercises   Exercises  Lumbar      Lumbar Exercises: Stretches   Passive Hamstring Stretch  Right;Left;3 reps;30 seconds   3x30 sec left, 5x10 sec Rt. due to mild neural tension noted   Single Knee to Chest Stretch  Other (comment)   15 ea. bilaterally x 5-10 second holds   ITB Stretch  Right;3 reps;30 seconds    Piriformis Stretch  Right;3 reps    Other Lumbar Stretch Exercise  lateral glides R hpis to wall 2x10    Other Lumbar Stretch Exercise  Therapist assisted DKTC with left rotation 2x10      Lumbar Exercises: Aerobic   Nustep  5 min    RPE 4/10 Modified Borg     Lumbar Exercises: Supine   Clam  20 reps    Clam Limitations  red      Manual Therapy   Manual therapy comments  R hip long axis distraction grade I-IV in supine, right lumbar STM in sidelying             PT Education - 07/14/18 1503    Education Details  Spine anatomy and potential etiology symptoms, also educated on logrolling technique for supine>sit    Person(s) Educated  Patient    Methods  Explanation;Verbal cues    Comprehension  Verbalized understanding       PT Short Term Goals - 07/06/18 1558      PT SHORT TERM GOAL #1   Title  He will be independent with initial HEp    Time  2    Period  Weeks    Status  New      PT SHORT TERM GOAL #2   Title  He will report pain  in RT leg decr 25% or more    Time  3    Period  Weeks    Status  New        PT Long  Term Goals - 07/06/18 1559      PT LONG TERM GOAL #1   Title  He will be independent with all hEp issued     Time  6    Period  Weeks    Status  New      PT LONG TERM GOAL #2   Title  He will report pain as intermittant and never above 3/10 with yard work    Time  6    Period  Weeks    Status  New      PT LONG TERM GOAL #3   Title  He will demo understanding of lifting mechanics  and limitations for lifting    Time  6    Period  Weeks    Status  New      PT LONG TERM GOAL #4   Title  He will be able to walk as needed around property without increased pain.     Time  6    Period  Weeks    Status  New      PT LONG TERM GOAL #5   Title  He will be able to sit for 45-60 min  without incr pain    Time  6    Period  Weeks    Status  New            Plan - 07/14/18 1506    Clinical Impression Statement  Pt. improving with centralization of Rt. LE symptoms and decreased pain intensity. He continues with mild Rt. lateral shift for posture thus continued side glide exercises to address. Continued previous treatment focus given improvement since last week which was well-tolerated. Pt. reports did not like heel lift previously given-felt like this exacerbated pain so has disconcontinued use. No significant leg length discrepancy noted today.    Rehab Potential  Good    PT Frequency  2x / week    PT Duration  6 weeks    PT Treatment/Interventions  Passive range of motion;Dry needling;Manual techniques;Patient/family education;Therapeutic activities;Therapeutic exercise;Moist Heat;Cryotherapy;Traction;Ultrasound    PT Next Visit Plan  Continue manual therapy for right hip and lumbar spine,, stretching , modalities as needed ( PACEMAKERAND DEFIBRILATOR No ES) core strengthening       Patient will benefit from skilled therapeutic intervention in order to improve the following deficits and  impairments:  Pain, Postural dysfunction, Increased muscle spasms, Decreased activity tolerance, Difficulty walking, Decreased range of motion  Visit Diagnosis: Right-sided low back pain with right-sided sciatica, unspecified chronicity  Abnormal posture     Problem List Patient Active Problem List   Diagnosis Date Noted  . Enterococcal bacteremia 12/08/2017  . Nausea & vomiting 12/06/2017  . Renal insufficiency 12/06/2017  . Acute respiratory failure with hypoxia ([place]) 09/10/2016  . Acute bronchitis with COPD ([place]) 09/10/2016  . CKD (chronic kidney disease) stage 4, GFR 15-29 ml/min (HCC) 04/04/2015  . 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 Urosepsis 08/16/2013  . Bruising 08/12/2012  . Bladder cancer ([place]) 07/08/2012  . UTI (urinary tract infection) 05/27/2012  . Bladder tumor 05/18/2012  . Knee pain, acute, right 02/11/2012  . CAROTID ARTERY DISEASE 12/06/2010  . Essential hypertension, benign 09/22/2009  . Chronic systolic CHF (congestive heart failure) (HCC)-EF 25% 09/14/2009  . CHEST PAIN 08/08/2009  . Mixed hyperlipidemia 05/25/2009  . CAD, NATIVE VESSEL 02/01/2009  . Ischemic cardiomyopathy 01/24/2009  . Atrial fibrillation ([place]) 01/21/2009  . ICD (implantable cardioverter-defibrillator) in place [facility]  Jude pacer/ICD) 01/21/2009    Herma Carson, PT, DPT 07/14/2018, 3:12 PM  [facility] [address] [place], [place], [address] Phone: [phone]   Fax:  [phone]  Name: [name] MRN: [id number] Date of Birth: Jan 20, 1932

## 2018-07-15 ENCOUNTER — Ambulatory Visit: Payer: Medicare Other | Admitting: Physical Therapy

## 2018-07-15 DIAGNOSIS — M5441 Lumbago with sciatica, right side: Secondary | ICD-10-CM

## 2018-07-15 DIAGNOSIS — R293 Abnormal posture: Secondary | ICD-10-CM | POA: Diagnosis not present

## 2018-07-20 NOTE — Therapy (Signed)
[place], [place], [address] Phone: [phone]   Fax:  [phone]  Physical Therapy Treatment  Patient Details  Name: [name] MRN: [id number] Date of Birth: [birth year] Referring Provider (PT): Eunice Blase, MD   Encounter Date: 07/15/2018  PT End of Session - 07/20/18 1617    Visit Number  4    Number of Visits  12    Date for PT Re-Evaluation  08/14/18    Authorization Type  MCR    Authorization Time Period  Progress at visit 10   KX at visit 15    PT Start Time  1015    PT Stop Time  1100    PT Time Calculation (min)  45 min    Activity Tolerance  Patient tolerated treatment well    Behavior During Therapy  [facility] for tasks assessed/performed       Past Medical History:  Diagnosis Date  . Acute bronchitis with COPD ([place]) 09/10/2016  . Anemia   . Atrial fibrillation or flutter    maintaining sinus on amiodarone    . Bladder cancer ([place]) dx'd 05/2012  . BPH (benign prostatic hyperplasia)   . CAD (coronary artery disease)     a. s/p anterior MI 12/05 c/b shock -> stent LAD   b. s/p stenting OM-1, 2/06  . CHF (congestive heart failure) (HCC)    due to ischemic CM  a. EF 20-30%. (Nov 2008)   b. s/p St. Jude BiV-ICD    c. CPX 07/2008  pvo2 16.3 (63% predicted) slope 34 RER 1.08 O2 pulse 93%  . Cough    occasional /productive, no fever/ not new  . CRI (chronic renal insufficiency)    (baseline 2.0-2.2)/ recent hospitalization 8/13  . GERD (gastroesophageal reflux disease)    hx Barretts esophagitis  . History of blood transfusion    following coumadin  usage  . HTN (hypertension)    EKG 05/14/12,Chest x ray 8/13, last ICD interrogation 8/13 EPIC  . Hyperlipidemia   . Hypothyroidism   . 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 Skin cancer    basal cells   facial x  4, 1 right forearm  . Sleep apnea    STOP BANG SCORE 4    Past Surgical History:  Procedure Laterality Date  . [place]   lower back  . CARDIAC DEFIBRILLATOR PLACEMENT     ICD St Jude; gen change 09-20-13  . CERVICAL LAMINECTOMY  1985  . COLONOSCOPY    . CORONARY ANGIOPLASTY  2005,2006  . CYSTOSCOPY W/ RETROGRADES  05/25/2012   Procedure: CYSTOSCOPY WITH RETROGRADE PYELOGRAM;  Surgeon: Molli Hazard, MD;  [facility];  Service: Urology;  Laterality: Bilateral;  . CYSTOSCOPY W/ URETERAL STENT PLACEMENT  07/08/2012   Procedure: CYSTOSCOPY WITH STENT REPLACEMENT;  Surgeon: Molli Hazard, MD;  [facility];  Service: Urology;  Laterality: Left;  . CYSTOSCOPY W/ URETERAL STENT REMOVAL  07/08/2012   Procedure: CYSTOSCOPY WITH STENT REMOVAL;  Surgeon: Molli Hazard, MD;  [facility];  Service: Urology;  Laterality: Bilateral;  . CYSTOSCOPY/RETROGRADE/URETEROSCOPY  07/08/2012   Procedure: CYSTOSCOPY/RETROGRADE/URETEROSCOPY;  Surgeon: Molli Hazard, MD;  [facility];  Service: Urology;  Laterality: Left;  . ESOPHAGOGASTRODUODENOSCOPY    . [place]   bilateral  . IMPLANTABLE  CARDIOVERTER DEFIBRILLATOR (ICD) GENERATOR CHANGE N/A 09/20/2013   Procedure: ICD GENERATOR CHANGE;  Surgeon: Deboraha Sprang, MD;  Location[facility] CATH LAB;  Service: Cardiovascular;  Laterality: N/A;  . TEE WITHOUT CARDIOVERSION N/A 12/10/2017   Procedure: TRANSESOPHAGEAL ECHOCARDIOGRAM (TEE);  Surgeon: Jerline Pain, MD;  Location: [facility] ENDOSCOPY;  Service: Cardiovascular;  Laterality: N/A;  . TRANSURETHRAL RESECTION OF BLADDER TUMOR  05/25/2012   cold cup biopsy prostate  . TRANSURETHRAL RESECTION OF BLADDER TUMOR  07/08/2012   Procedure: TRANSURETHRAL RESECTION OF BLADDER TUMOR (TURBT);  Surgeon: Molli Hazard, MD;  [facility];  Service: Urology;  Laterality: N/A;  CYSTO, TURBT W/ GYRUS, BILATERAL STENT REMOVAL, LEFT URETEROSCOPY WITH BIOPSY,  POSSIBLE LEFT STENT PLACEMENT     There were no vitals filed for this visit.   Exercise flow sheet Hamstring stretch 3 reps X 30 sec bilat SKTC 15 ea bilat 5 sec hold IT band stretch Rt 3 reps, 30 sec hold Lateral glides Rt hips to wall 2X10 Nu step 5 min Supine clams 20 reps, red T-band  Manual Therapy: Rt long axis distraction 5 bouts X 30 sec ea, gentle grade 1-2.                           PT Short Term Goals - 07/06/18 1558      PT SHORT TERM GOAL #1   Title  He will be independent with initial HEp    Time  2    Period  Weeks    Status  New      PT SHORT TERM GOAL #2   Title  He will report pain in RT leg decr 25% or more    Time  3    Period  Weeks    Status  New        PT Long Term Goals - 07/06/18 1559      PT LONG TERM GOAL #1   Title  He will be independent with all hEp issued     Time  6    Period  Weeks    Status  New      PT LONG TERM GOAL #2   Title  He will report pain as intermittant and never above 3/10 with yard work    Time  6    Period  Weeks    Status  New      PT LONG TERM GOAL #3   Title  He will demo understanding of lifting mechanics  and limitations for lifting    Time  6    Period  Weeks    Status  New      PT LONG TERM GOAL #4   Title  He will be able to walk as needed around property without increased pain.     Time  6    Period  Weeks    Status  New      PT LONG TERM GOAL #5   Title  He will be able to sit for 45-60 min  without incr pain    Time  6    Period  Weeks    Status  New            Plan - 07/20/18 1618    Clinical Impression Statement  Pt had good response to session and reported no pain or difficulty with any activity. Repeated flexion based program, MT, and side gliding continued with good response.  Rehab Potential  Good    PT Frequency  2x / week    PT Duration  6 weeks    PT Treatment/Interventions  Passive range of motion;Dry needling;Manual techniques;Patient/family  education;Therapeutic activities;Therapeutic exercise;Moist Heat;Cryotherapy;Traction;Ultrasound    PT Next Visit Plan  Continue manual therapy for right hip and lumbar spine,, stretching , modalities as needed ( PACEMAKERAND DEFIBRILATOR No ES) core strengthening    PT Home Exercise Plan  lateal shift in standing    Consulted and Agree with Plan of Care  Patient       Patient will benefit from skilled therapeutic intervention in order to improve the following deficits and impairments:  Pain, Postural dysfunction, Increased muscle spasms, Decreased activity tolerance, Difficulty walking, Decreased range of motion  Visit Diagnosis: Right-sided low back pain with right-sided sciatica, unspecified chronicity  Abnormal posture     Problem List Patient Active Problem List   Diagnosis Date Noted  . Enterococcal bacteremia 12/08/2017  . Nausea & vomiting 12/06/2017  . Renal insufficiency 12/06/2017  . Acute respiratory failure with hypoxia ([place]) 09/10/2016  . Acute bronchitis with COPD ([place]) 09/10/2016  . CKD (chronic kidney disease) stage 4, GFR 15-29 ml/min (HCC) 04/04/2015  . 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 Urosepsis 08/16/2013  . Bruising 08/12/2012  . Bladder cancer ([place]) 07/08/2012  . UTI (urinary tract infection) 05/27/2012  . Bladder tumor 05/18/2012  . Knee pain, acute, right 02/11/2012  . CAROTID ARTERY DISEASE 12/06/2010  . Essential hypertension, benign 09/22/2009  . Chronic systolic CHF (congestive heart failure) (HCC)-EF 25% 09/14/2009  . CHEST PAIN 08/08/2009  . Mixed hyperlipidemia 05/25/2009  . CAD, NATIVE VESSEL 02/01/2009  . Ischemic cardiomyopathy 01/24/2009  . Atrial fibrillation ([place]) 01/21/2009  . ICD (implantable cardioverter-defibrillator) in place [facility] Jude pacer/ICD) 01/21/2009    Debbe Odea, PT, DPT 07/20/2018, 4:23 PM  [facility] [address] [place], [place], [address] Phone: [phone]   Fax:  [phone]  Name: [name] MRN: [id number] Date of Birth: 01-06-1932

## 2018-07-21 ENCOUNTER — Ambulatory Visit: Payer: Medicare Other

## 2018-07-21 DIAGNOSIS — M5441 Lumbago with sciatica, right side: Secondary | ICD-10-CM | POA: Diagnosis not present

## 2018-07-21 DIAGNOSIS — R293 Abnormal posture: Secondary | ICD-10-CM

## 2018-07-21 NOTE — Therapy (Signed)
[place], [place], [address] Phone: [phone]   Fax:  [phone]  Physical Therapy Treatment  Patient Details  Name: [name] MRN: [id number] Date of Birth: Aug 31, 1932 Referring Provider (PT): Eunice Blase, MD   Encounter Date: 07/21/2018  PT End of Session - 07/21/18 1146    Visit Number  5    Number of Visits  12    Date for PT Re-Evaluation  08/14/18    Authorization Type  MCR    Authorization Time Period  Progress at visit 10   KX at visit 15    PT Start Time  1145    PT Stop Time  1235    PT Time Calculation (min)  50 min    Activity Tolerance  Patient tolerated treatment well    Behavior During Therapy  [facility] for tasks assessed/performed       Past Medical History:  Diagnosis Date  . Acute bronchitis with COPD ([place]) 09/10/2016  . Anemia   . Atrial fibrillation or flutter    maintaining sinus on amiodarone    . Bladder cancer ([place]) dx'd 05/2012  . BPH (benign prostatic hyperplasia)   . CAD (coronary artery disease)     a. s/p anterior MI 12/05 c/b shock -> stent LAD   b. s/p stenting OM-1, 2/06  . CHF (congestive heart failure) (HCC)    due to ischemic CM  a. EF 20-30%. (Nov 2008)   b. s/p St. Jude BiV-ICD    c. CPX 07/2008  pvo2 16.3 (63% predicted) slope 34 RER 1.08 O2 pulse 93%  . Cough    occasional /productive, no fever/ not new  . CRI (chronic renal insufficiency)    (baseline 2.0-2.2)/ recent hospitalization 8/13  . GERD (gastroesophageal reflux disease)    hx Barretts esophagitis  . History of blood transfusion    following coumadin  usage  . HTN (hypertension)    EKG 05/14/12,Chest x ray 8/13, last ICD interrogation 8/13 EPIC  . Hyperlipidemia   . Hypothyroidism   . 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 Skin cancer    basal cells   facial x  4, 1 right forearm  . Sleep apnea    STOP BANG SCORE 4    Past Surgical History:  Procedure Laterality Date  . [place]   lower back  . CARDIAC DEFIBRILLATOR PLACEMENT     ICD St Jude; gen change 09-20-13  . CERVICAL LAMINECTOMY  1985  . COLONOSCOPY    . CORONARY ANGIOPLASTY  2005,2006  . CYSTOSCOPY W/ RETROGRADES  05/25/2012   Procedure: CYSTOSCOPY WITH RETROGRADE PYELOGRAM;  Surgeon: Molli Hazard, MD;  Location: [facility];  Service: Urology;  Laterality: Bilateral;  . CYSTOSCOPY W/ URETERAL STENT PLACEMENT  07/08/2012   Procedure: CYSTOSCOPY WITH STENT REPLACEMENT;  Surgeon: Molli Hazard, MD;  Location: [facility];  Service: Urology;  Laterality: Left;  . CYSTOSCOPY W/ URETERAL STENT REMOVAL  07/08/2012   Procedure: CYSTOSCOPY WITH STENT REMOVAL;  Surgeon: Molli Hazard, MD;  [facility];  Service: Urology;  Laterality: Bilateral;  . CYSTOSCOPY/RETROGRADE/URETEROSCOPY  07/08/2012   Procedure: CYSTOSCOPY/RETROGRADE/URETEROSCOPY;  Surgeon: Molli Hazard, MD;  [facility];  Service: Urology;  Laterality: Left;  . ESOPHAGOGASTRODUODENOSCOPY    . [place]   bilateral  . IMPLANTABLE  CARDIOVERTER DEFIBRILLATOR (ICD) GENERATOR CHANGE N/A 09/20/2013   Procedure: ICD GENERATOR CHANGE;  Surgeon: Deboraha Sprang, MD;  [facility] CATH LAB;  Service: Cardiovascular;  Laterality: N/A;  . TEE WITHOUT CARDIOVERSION N/A 12/10/2017   Procedure: TRANSESOPHAGEAL ECHOCARDIOGRAM (TEE);  Surgeon: Jerline Pain, MD;  Location[facility] ENDOSCOPY;  Service: Cardiovascular;  Laterality: N/A;  . TRANSURETHRAL RESECTION OF BLADDER TUMOR  05/25/2012   cold cup biopsy prostate  . TRANSURETHRAL RESECTION OF BLADDER TUMOR  07/08/2012   Procedure: TRANSURETHRAL RESECTION OF BLADDER TUMOR (TURBT);  Surgeon: Molli Hazard, MD;  [facility];  Service: Urology;  Laterality: N/A;  CYSTO, TURBT W/ GYRUS, BILATERAL STENT REMOVAL, LEFT URETEROSCOPY WITH BIOPSY,  POSSIBLE LEFT STENT PLACEMENT     There were no vitals filed for this visit.  Subjective Assessment - 07/21/18 1149    Subjective  No pain today. Changed flat on mower last night and RT back pain started until slept it off.     Currently in Pain?  No/denies                       [facility] Adult PT Treatment/Exercise - 07/21/18 0001      Exercises   Exercises  Lumbar      Lumbar Exercises: Stretches   Passive Hamstring Stretch  Right;Left;30 seconds;1 rep    Single Knee to Chest Stretch  Right;Left;30 seconds;2 reps    Lower Trunk Rotation  2 reps;30 seconds    Lower Trunk Rotation Limitations  RT/LT    ITB Stretch  Right;3 reps;30 seconds    Piriformis Stretch  Right;3 reps    Other Lumbar Stretch Exercise  lateral glides R hpis to wall 2x10 (10 shoulder on wall 10 elbow on wall shoulder 90 degrees      Lumbar Exercises: Aerobic   Nustep  5 min LE only      Lumbar Exercises: Supine   Glut Set  15 reps    Clam  20 reps    Clam Limitations  green    Bent Knee Raise  10 reps    Bent Knee Raise Limitations  rt/lt      Modalities   Modalities  Moist Heat      Moist Heat Therapy   Number Minutes Moist Heat  10 Minutes    Moist Heat Location  Hip   RT on during part of exercise/stret ch     Manual Therapy   Manual Therapy  Soft tissue mobilization    Manual therapy comments  R hip long axis distraction grade I-IV in supine, right lumbar STM in sidelying    Soft tissue mobilization  ith ball to soft tissues of RT hip               PT Short Term Goals - 07/21/18 1221      PT SHORT TERM GOAL #1   Title  He will be independent with initial HEp    Status  On-going      PT SHORT TERM GOAL #2   Title  He will report pain in RT leg decr 25% or more    Status  Achieved        PT Long Term Goals - 07/21/18 1221      PT LONG TERM GOAL #1   Title  He will be independent with all hEp issued     Status  On-going      PT LONG TERM GOAL #2   Title  He will report pain as intermittant and never above 3/10 with yard work    Status  Partially Met      PT [place] #3   Title  He will demo understanding of lifting mechanics  and limitations for lifting    Status  On-going      PT LONG TERM GOAL #4   Title  He will be able to walk as needed around property without increased pain.     Status  On-going      PT LONG TERM GOAL #5   Title  He will be able to sit for 45-60 min  without incr pain    Status  On-going            Plan - 07/21/18 1146    Clinical Impression Statement  Activity with lifting and bending caused RT back Pain  that resolved with rest.   He is much improved from eval . Degenerative changes in spine may limit activity without pain. Tenderness mostly in Rt gluteals today.   Overal;l significnat improvement      History and Personal Factors relevant to plan of care:       PT Treatment/Interventions  Passive range of motion;Dry needling;Manual techniques;Patient/family education;Therapeutic activities;Therapeutic exercise;Moist Heat;Cryotherapy;Traction;Ultrasound    PT Next Visit Plan  Continue manual therapy for right hip and lumbar spine,, stretching , modalities as needed ( PACEMAKERAND DEFIBRILATOR No ES) core strengthening    PT Home Exercise Plan  lateal shift in standing    Consulted and Agree with Plan of Care  Patient       Patient will benefit from skilled therapeutic intervention in order to improve the following deficits and impairments:  Pain, Postural dysfunction, Increased muscle spasms, Decreased activity tolerance, Difficulty walking, Decreased range of motion  Visit Diagnosis: Right-sided low back pain with right-sided sciatica, unspecified chronicity  Abnormal posture     Problem List Patient Active Problem List   Diagnosis Date Noted  . Enterococcal bacteremia 12/08/2017  . Nausea & vomiting 12/06/2017  . Renal insufficiency 12/06/2017  . Acute respiratory failure with hypoxia  ([place]) 09/10/2016  . Acute bronchitis with COPD ([place]) 09/10/2016  . CKD (chronic kidney disease) stage 4, GFR 15-29 ml/min (HCC) 04/04/2015  . 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 Urosepsis 08/16/2013  . Bruising 08/12/2012  . Bladder cancer ([place]) 07/08/2012  . UTI (urinary tract infection) 05/27/2012  . Bladder tumor 05/18/2012  . Knee pain, acute, right 02/11/2012  . CAROTID ARTERY DISEASE 12/06/2010  . Essential hypertension, benign 09/22/2009  . Chronic systolic CHF (congestive heart failure) (HCC)-EF 25% 09/14/2009  . CHEST PAIN 08/08/2009  . Mixed hyperlipidemia 05/25/2009  . CAD, NATIVE VESSEL 02/01/2009  . Ischemic cardiomyopathy 01/24/2009  . Atrial fibrillation ([place]) 01/21/2009  . ICD (implantable cardioverter-defibrillator) in place [facility] Jude pacer/ICD) 01/21/2009    Darrel Hoover  PT 07/21/2018, 12:22 PM  [facility] [address] [place], [place], [address] Phone: [phone]   Fax:  [phone]  Name: [name] MRN: [id number] Date of Birth: [birth year]

## 2018-07-22 ENCOUNTER — Ambulatory Visit (INDEPENDENT_AMBULATORY_CARE_PROVIDER_SITE_OTHER): Payer: Medicare Other | Admitting: *Deleted

## 2018-07-22 ENCOUNTER — Ambulatory Visit: Payer: Medicare Other | Admitting: Physical Therapy

## 2018-07-22 ENCOUNTER — Encounter: Payer: Self-pay | Admitting: Physical Therapy

## 2018-07-22 DIAGNOSIS — M5441 Lumbago with sciatica, right side: Secondary | ICD-10-CM | POA: Diagnosis not present

## 2018-07-22 DIAGNOSIS — R293 Abnormal posture: Secondary | ICD-10-CM | POA: Diagnosis not present

## 2018-07-22 DIAGNOSIS — I255 Ischemic cardiomyopathy: Secondary | ICD-10-CM | POA: Diagnosis not present

## 2018-07-22 NOTE — Progress Notes (Signed)
Remote ICD transmission.   

## 2018-07-22 NOTE — Patient Instructions (Addendum)
Sleeping on Back  Place pillow under knees. A pillow with cervical support and a roll around waist are also helpful. Copyright  VHI. All rights reserved.  Sleeping on Side Place pillow between knees. Use cervical support under neck and a roll around waist as needed. Copyright  VHI. All rights reserved.   Sleeping on Stomach   If this is the only desirable sleeping position, place pillow under lower legs, and under stomach or chest as needed.  Posture - Sitting   Sit upright, head facing forward. Try using a roll to support lower back. Keep shoulders relaxed, and avoid rounded back. Keep hips level with knees. Avoid crossing legs for long periods. Stand to Sit / Sit to Stand   To sit: Bend knees to lower self onto front edge of chair, then scoot back on seat. To stand: Reverse sequence by placing one foot forward, and scoot to front of seat. Use rocking motion to stand up.   Work Height and Reach  Ideal work height is no more than 2 to 4 inches below elbow level when standing, and at elbow level when sitting. Reaching should be limited to arm's length, with elbows slightly bent.  Bending  Bend at hips and knees, not back. Keep feet shoulder-width apart.    Posture - Standing   Good posture is important. Avoid slouching and forward head thrust. Maintain curve in low back and align ears over shoul- ders, hips over ankles.  Alternating Positions   Alternate tasks and change positions frequently to reduce fatigue and muscle tension. Take rest breaks. Computer Work   Position work to face forward. Use proper work and seat height. Keep shoulders back and down, wrists straight, and elbows at right angles. Use chair that provides full back support. Add footrest and lumbar roll as needed.  Getting Into / Out of Car  Lower self onto seat, scoot back, then bring in one leg at a time. Reverse sequence to get out.  Dressing  Lie on back to pull socks or slacks over feet, or sit  and bend leg while keeping back straight.    Housework - Sink  Place one foot on ledge of cabinet under sink when standing at sink for prolonged periods.   Pushing / Pulling  Pushing is preferable to pulling. Keep back in proper alignment, and use leg muscles to do the work.  Deep Squat   Squat and lift with both arms held against upper trunk. Tighten stomach muscles without holding breath. Use smooth movements to avoid jerking.  Avoid Twisting   Avoid twisting or bending back. Pivot around using foot movements, and bend at knees if needed when reaching for articles.  Carrying Luggage   Distribute weight evenly on both sides. Use a cart whenever possible. Do not twist trunk. Move body as a unit.   Lifting Principles .Maintain proper posture and head alignment. .Slide object as close as possible before lifting. .Move obstacles out of the way. .Test before lifting; ask for help if too heavy. .Tighten stomach muscles without holding breath. .Use smooth movements; do not jerk. .Use legs to do the work, and pivot with feet. .Distribute the work load symmetrically and close to the center of trunk. .Push instead of pull whenever possible.   Ask For Help   Ask for help and delegate to others when possible. Coordinate your movements when lifting together, and maintain the low back curve.  Log Roll   Lying on back, bend left knee and place left   arm across chest. Roll all in one movement to the right. Reverse to roll to the left. Always move as one unit. Housework - Sweeping  Use long-handled equipment to avoid stooping.   Housework - Wiping  Position yourself as close as possible to reach work surface. Avoid straining your back.  Laundry - Unloading Wash   To unload small items at bottom of washer, lift leg opposite to arm being used to reach.  [place] close to area to be raked. Use arm movements to do the work. Keep back straight and avoid  twisting.     Cart  When reaching into cart with one arm, lift opposite leg to keep back straight.   Getting Into / Out of Bed  Lower self to lie down on one side by raising legs and lowering head at the same time. Use arms to assist moving without twisting. Bend both knees to roll onto back if desired. To sit up, start from lying on side, and use same move-ments in reverse. Housework - Vacuuming  Hold the vacuum with arm held at side. Step back and forth to move it, keeping head up. Avoid twisting.   Laundry - IT consultant so that bending and twisting can be avoided.   Laundry - Unloading Dryer  Squat down to reach into clothes dryer or use a reacher.  Gardening - Weeding / Probation officer or Kneel. Knee pads may be helpful.                  Gastroc Stretch    Stand with right foot back, leg straight, forward leg bent. Keeping heel on floor, turned slightly out, lean into wall until stretch is felt in calf. Hold __30__ seconds. Repeat __3__ times per set. Do _1___ sessions per day.  http://orth.exer.us/26   Copyright  VHI. All rights reserved.

## 2018-07-22 NOTE — Therapy (Signed)
[place], [place], [address] Phone: [phone]   Fax:  [phone]  Physical Therapy Treatment  Patient Details  Name: [name] MRN: [id number] Date of Birth: August 09, 1932 Referring Provider (PT): Eunice Blase, MD   Encounter Date: 07/22/2018  PT End of Session - 07/22/18 1255    Visit Number  6    Number of Visits  12    Date for PT Re-Evaluation  08/14/18    Authorization Type  MCR    Authorization Time Period  Progress at visit 10   KX at visit 15    PT Start Time  1145    PT Stop Time  1233    PT Time Calculation (min)  48 min    Activity Tolerance  Patient tolerated treatment well    Behavior During Therapy  [facility] for tasks assessed/performed       Past Medical History:  Diagnosis Date  . Acute bronchitis with COPD ([place]) 09/10/2016  . Anemia   . Atrial fibrillation or flutter    maintaining sinus on amiodarone    . Bladder cancer ([place]) dx'd 05/2012  . BPH (benign prostatic hyperplasia)   . CAD (coronary artery disease)     a. s/p anterior MI 12/05 c/b shock -> stent LAD   b. s/p stenting OM-1, 2/06  . CHF (congestive heart failure) (HCC)    due to ischemic CM  a. EF 20-30%. (Nov 2008)   b. s/p St. Jude BiV-ICD    c. CPX 07/2008  pvo2 16.3 (63% predicted) slope 34 RER 1.08 O2 pulse 93%  . Cough    occasional /productive, no fever/ not new  . CRI (chronic renal insufficiency)    (baseline 2.0-2.2)/ recent hospitalization 8/13  . GERD (gastroesophageal reflux disease)    hx Barretts esophagitis  . History of blood transfusion    following coumadin  usage  . HTN (hypertension)    EKG 05/14/12,Chest x ray 8/13, last ICD interrogation 8/13 EPIC  . Hyperlipidemia   . Hypothyroidism   . 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 Skin cancer    basal cells   facial x  4, 1 right forearm  . Sleep apnea    STOP BANG SCORE 4    Past Surgical History:  Procedure Laterality Date  . [place]   lower back  . CARDIAC DEFIBRILLATOR PLACEMENT     ICD St Jude; gen change 09-20-13  . CERVICAL LAMINECTOMY  1985  . COLONOSCOPY    . CORONARY ANGIOPLASTY  2005,2006  . CYSTOSCOPY W/ RETROGRADES  05/25/2012   Procedure: CYSTOSCOPY WITH RETROGRADE PYELOGRAM;  Surgeon: Molli Hazard, MD;  [facility];  Service: Urology;  Laterality: Bilateral;  . CYSTOSCOPY W/ URETERAL STENT PLACEMENT  07/08/2012   Procedure: CYSTOSCOPY WITH STENT REPLACEMENT;  Surgeon: Molli Hazard, MD;  [facility];  Service: Urology;  Laterality: Left;  . CYSTOSCOPY W/ URETERAL STENT REMOVAL  07/08/2012   Procedure: CYSTOSCOPY WITH STENT REMOVAL;  Surgeon: Molli Hazard, MD;  [facility];  Service: Urology;  Laterality: Bilateral;  . CYSTOSCOPY/RETROGRADE/URETEROSCOPY  07/08/2012   Procedure: CYSTOSCOPY/RETROGRADE/URETEROSCOPY;  Surgeon: Molli Hazard, MD;  [facility];  Service: Urology;  Laterality: Left;  . ESOPHAGOGASTRODUODENOSCOPY    . [place]   bilateral  . IMPLANTABLE  CARDIOVERTER DEFIBRILLATOR (ICD) GENERATOR CHANGE N/A 09/20/2013   Procedure: ICD GENERATOR CHANGE;  Surgeon: Deboraha Sprang, MD;  Location[facility] CATH LAB;  Service: Cardiovascular;  Laterality: N/A;  . TEE WITHOUT CARDIOVERSION N/A 12/10/2017   Procedure: TRANSESOPHAGEAL ECHOCARDIOGRAM (TEE);  Surgeon: Jerline Pain, MD;  Location: [facility] ENDOSCOPY;  Service: Cardiovascular;  Laterality: N/A;  . TRANSURETHRAL RESECTION OF BLADDER TUMOR  05/25/2012   cold cup biopsy prostate  . TRANSURETHRAL RESECTION OF BLADDER TUMOR  07/08/2012   Procedure: TRANSURETHRAL RESECTION OF BLADDER TUMOR (TURBT);  Surgeon: Molli Hazard, MD;  [facility];  Service: Urology;  Laterality: N/A;  CYSTO, TURBT W/ GYRUS, BILATERAL STENT REMOVAL, LEFT URETEROSCOPY WITH BIOPSY,  POSSIBLE LEFT STENT PLACEMENT     There were no vitals filed for this visit.  Subjective Assessment - 07/22/18 1153    Subjective  PT                        [facility] Adult PT Treatment/Exercise - 07/22/18 0001      Self-Care   Self-Care  ADL's;Lifting;Other Self-Care Comments    ADL's  Handout issued.  Reviewed  in part.    Lifting  ball practice from mat,  moderate cues    Other Self-Care Comments   demo how to get off floor when nothing else is around      Lumbar Exercises: Stretches   Passive Hamstring Stretch  3 reps;30 seconds    Lower Trunk Rotation  3 reps;10 seconds    ITB Stretch  3 reps;30 seconds    Gastroc Stretch Limitations  on incline and on floor added to HEP    Other Lumbar Stretch Exercise  Lateral glides     Other Lumbar Stretch Exercise  HIP IR/ER stretch 10 seconds X 2      Lumbar Exercises: Standing   Heel Raises  10 reps    Heel Raises Limitations  toe lifts small motions    Other Standing Lumbar Exercises  6 inch steo pu 10 x each.  needed brief rest              PT Education - 07/22/18 1254    Education Details  ADL, lifting,  how to get off floor when nothing is around ( demo only)    Person(s) Educated  Patient    Methods  Explanation;Demonstration;Handout    Comprehension  Verbalized understanding;Returned demonstration;Need further instruction       PT Short Term Goals - 07/21/18 1221      PT SHORT TERM GOAL #1   Title  He will be independent with initial HEp    Status  On-going      PT SHORT TERM GOAL #2   Title  He will report pain in RT leg decr 25% or more    Status  Achieved        PT Long Term Goals - 07/21/18 1221      PT LONG TERM GOAL #1   Title  He will be independent with all hEp issued     Status  On-going      PT LONG TERM GOAL #2   Title  He will report pain as intermittant and never above 3/10 with yard work    Status  Partially Met      PT [place] #3   Title  He will demo  understanding of lifting mechanics  and limitations for lifting    Status  On-going  PT LONG TERM GOAL #4   Title  He will be able to walk as needed around property without increased pain.     Status  On-going      PT LONG TERM GOAL #5   Title  He will be able to sit for 45-60 min  without incr pain    Status  On-going            Plan - 07/22/18 1256    Clinical Impression Statement  Lifting practiced with ball from mat.  Correct technique required moderate + cues.  He has thrown the heel lift away due to almost falling with 2 X.  Pain is improving with no pain today.Calf / ankle tightness addressed with exercise.    PT Next Visit Plan  Continue manual therapy for right hip and lumbar spine,, stretching , modalities as needed ( PACEMAKERAND DEFIBRILATOR No ES) core strengthening    PT Home Exercise Plan  lateal shift in standing, calf stretch.  patient has other HEP issued from other visits not listed here.     Consulted and Agree with Plan of Care  Patient       Patient will benefit from skilled therapeutic intervention in order to improve the following deficits and impairments:     Visit Diagnosis: Right-sided low back pain with right-sided sciatica, unspecified chronicity  Abnormal posture     Problem List Patient Active Problem List   Diagnosis Date Noted  . Enterococcal bacteremia 12/08/2017  . Nausea & vomiting 12/06/2017  . Renal insufficiency 12/06/2017  . Acute respiratory failure with hypoxia ([place]) 09/10/2016  . Acute bronchitis with COPD ([place]) 09/10/2016  . CKD (chronic kidney disease) stage 4, GFR 15-29 ml/min (HCC) 04/04/2015  . 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 Urosepsis 08/16/2013  . Bruising 08/12/2012  . Bladder cancer ([place]) 07/08/2012  . UTI (urinary tract infection) 05/27/2012  . Bladder tumor 05/18/2012  . Knee pain, acute, right 02/11/2012  . CAROTID ARTERY DISEASE 12/06/2010  . Essential  hypertension, benign 09/22/2009  . Chronic systolic CHF (congestive heart failure) (HCC)-EF 25% 09/14/2009  . CHEST PAIN 08/08/2009  . Mixed hyperlipidemia 05/25/2009  . CAD, NATIVE VESSEL 02/01/2009  . Ischemic cardiomyopathy 01/24/2009  . Atrial fibrillation ([place]) 01/21/2009  . ICD (implantable cardioverter-defibrillator) in place [facility] Jude pacer/ICD) 01/21/2009    Raylin Winer PTA 07/22/2018, 1:19 PM  [facility] [address] [place], [place], [address] Phone: [phone]   Fax:  [phone]  Name: [name] MRN: [id number] Date of Birth: 1931-12-15

## 2018-07-28 ENCOUNTER — Ambulatory Visit: Payer: Medicare Other

## 2018-07-28 DIAGNOSIS — M5441 Lumbago with sciatica, right side: Secondary | ICD-10-CM

## 2018-07-28 DIAGNOSIS — R293 Abnormal posture: Secondary | ICD-10-CM

## 2018-07-28 NOTE — Therapy (Signed)
[place], [place], [address] Phone: [phone]   Fax:  [phone]  Physical Therapy Treatment  Patient Details  Name: [name] MRN: [id number] Date of Birth: 1932-10-13 Referring Provider (PT): Eunice Blase, MD   Encounter Date: 07/28/2018  PT End of Session - 07/28/18 1138    Visit Number  7    Number of Visits  12    Date for PT Re-Evaluation  08/14/18    Authorization Type  MCR    Authorization Time Period  Progress at visit 10   KX at visit 15    PT Start Time  1140    PT Stop Time  1225    PT Time Calculation (min)  45 min    Activity Tolerance  Patient tolerated treatment well    Behavior During Therapy  [facility] for tasks assessed/performed       Past Medical History:  Diagnosis Date  . Acute bronchitis with COPD ([place]) 09/10/2016  . Anemia   . Atrial fibrillation or flutter    maintaining sinus on amiodarone    . Bladder cancer ([place]) dx'd 05/2012  . BPH (benign prostatic hyperplasia)   . CAD (coronary artery disease)     a. s/p anterior MI 12/05 c/b shock -> stent LAD   b. s/p stenting OM-1, 2/06  . CHF (congestive heart failure) (HCC)    due to ischemic CM  a. EF 20-30%. (Nov 2008)   b. s/p St. Jude BiV-ICD    c. CPX 07/2008  pvo2 16.3 (63% predicted) slope 34 RER 1.08 O2 pulse 93%  . Cough    occasional /productive, no fever/ not new  . CRI (chronic renal insufficiency)    (baseline 2.0-2.2)/ recent hospitalization 8/13  . GERD (gastroesophageal reflux disease)    hx Barretts esophagitis  . History of blood transfusion    following coumadin  usage  . HTN (hypertension)    EKG 05/14/12,Chest x ray 8/13, last ICD interrogation 8/13 EPIC  . Hyperlipidemia   . Hypothyroidism   . 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 Skin cancer    basal cells   facial x  4, 1 right forearm  . Sleep apnea    STOP BANG SCORE 4    Past Surgical History:  Procedure Laterality Date  . [place]   lower back  . CARDIAC DEFIBRILLATOR PLACEMENT     ICD St Jude; gen change 09-20-13  . CERVICAL LAMINECTOMY  1985  . COLONOSCOPY    . CORONARY ANGIOPLASTY  2005,2006  . CYSTOSCOPY W/ RETROGRADES  05/25/2012   Procedure: CYSTOSCOPY WITH RETROGRADE PYELOGRAM;  Surgeon: Molli Hazard, MD;  [facility]: [facility];  Service: Urology;  Laterality: Bilateral;  . CYSTOSCOPY W/ URETERAL STENT PLACEMENT  07/08/2012   Procedure: CYSTOSCOPY WITH STENT REPLACEMENT;  Surgeon: Molli Hazard, MD;  [facility];  Service: Urology;  Laterality: Left;  . CYSTOSCOPY W/ URETERAL STENT REMOVAL  07/08/2012   Procedure: CYSTOSCOPY WITH STENT REMOVAL;  Surgeon: Molli Hazard, MD;  [facility];  Service: Urology;  Laterality: Bilateral;  . CYSTOSCOPY/RETROGRADE/URETEROSCOPY  07/08/2012   Procedure: CYSTOSCOPY/RETROGRADE/URETEROSCOPY;  Surgeon: Molli Hazard, MD;  [facility];  Service: Urology;  Laterality: Left;  . ESOPHAGOGASTRODUODENOSCOPY    . [place]   bilateral  . IMPLANTABLE  CARDIOVERTER DEFIBRILLATOR (ICD) GENERATOR CHANGE N/A 09/20/2013   Procedure: ICD GENERATOR CHANGE;  Surgeon: Deboraha Sprang, MD;  Location: [facility] CATH LAB;  Service: Cardiovascular;  Laterality: N/A;  . TEE WITHOUT CARDIOVERSION N/A 12/10/2017   Procedure: TRANSESOPHAGEAL ECHOCARDIOGRAM (TEE);  Surgeon: Jerline Pain, MD;  Location: [facility] ENDOSCOPY;  Service: Cardiovascular;  Laterality: N/A;  . TRANSURETHRAL RESECTION OF BLADDER TUMOR  05/25/2012   cold cup biopsy prostate  . TRANSURETHRAL RESECTION OF BLADDER TUMOR  07/08/2012   Procedure: TRANSURETHRAL RESECTION OF BLADDER TUMOR (TURBT);  Surgeon: Molli Hazard, MD;  [facility];  Service: Urology;  Laterality: N/A;  CYSTO, TURBT W/ GYRUS, BILATERAL STENT REMOVAL, LEFT URETEROSCOPY WITH BIOPSY,  POSSIBLE LEFT STENT PLACEMENT     There were no vitals filed for this visit.  Subjective Assessment - 07/28/18 1152    Subjective  Pain now on and off.    Legs feel weak at times and feel like they will give out.     Patient is accompained by:  Family member    Currently in Pain?  No/denies                       [facility] Adult PT Treatment/Exercise - 07/28/18 0001      Exercises   Exercises  Knee/Hip;Lumbar;Ankle      Lumbar Exercises: Stretches   Single Knee to Chest Stretch  Right;Left;30 seconds;2 reps      Lumbar Exercises: Aerobic   Recumbent Bike  5 min L2       Lumbar Exercises: Supine   Clam  20 reps    Clam Limitations  band    Bent Knee Raise  15 reps    Bent Knee Raise Limitations  RT/LT      Knee/Hip Exercises: Standing   Heel Raises  Both;20 reps    Hip Flexion  Right;Left;15 reps    Hip Abduction  Right;Left;15 reps    Forward Step Up  Right;Left;15 reps      Knee/Hip Exercises: Supine   Bridges  20 reps      Ankle Exercises: Stretches   Slant Board Stretch  2 reps;30 seconds               PT Short Term Goals - 07/21/18 1221      PT SHORT TERM GOAL #1   Title  He will be independent with initial HEp    Status  On-going      PT SHORT TERM GOAL #2   Title  He will report pain in RT leg decr 25% or more    Status  Achieved        PT Long Term Goals - 07/21/18 1221      PT LONG TERM GOAL #1   Title  He will be independent with all hEp issued     Status  On-going      PT LONG TERM GOAL #2   Title  He will report pain as intermittant and never above 3/10 with yard work    Status  Partially Met      PT LONG TERM GOAL #3   Title  He will demo understanding of lifting mechanics  and limitations for lifting    Status  On-going      PT LONG TERM GOAL #4   Title  He will be able to walk as needed around property without increased pain.     Status  On-going  PT LONG TERM GOAL #5   Title  He will be able to sit for  45-60 min  without incr pain    Status  On-going            Plan - 07/28/18 1139    Clinical Impression Statement  No pain with exercies today. No excess fatigue.  Need to progress strength in LE and stretching  and add to HEP    PT Next Visit Plan  Continue manual therapy for right hip and lumbar spine if needed,, stretching , modalities as needed ( PACEMAKERAND DEFIBRILATOR No ES) ,  LE and core strengthening    PT Home Exercise Plan  lateal shift in standing, calf stretch.  patient has other HEP issued from other visits not listed here.     Consulted and Agree with Plan of Care  Patient       Patient will benefit from skilled therapeutic intervention in order to improve the following deficits and impairments:  Pain, Postural dysfunction, Increased muscle spasms, Decreased activity tolerance, Difficulty walking, Decreased range of motion  Visit Diagnosis: Right-sided low back pain with right-sided sciatica, unspecified chronicity  Abnormal posture     Problem List Patient Active Problem List   Diagnosis Date Noted  . Enterococcal bacteremia 12/08/2017  . Nausea & vomiting 12/06/2017  . Renal insufficiency 12/06/2017  . Acute respiratory failure with hypoxia ([place]) 09/10/2016  . Acute bronchitis with COPD ([place]) 09/10/2016  . CKD (chronic kidney disease) stage 4, GFR 15-29 ml/min (HCC) 04/04/2015  . 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 Urosepsis 08/16/2013  . Bruising 08/12/2012  . Bladder cancer ([place]) 07/08/2012  . UTI (urinary tract infection) 05/27/2012  . Bladder tumor 05/18/2012  . Knee pain, acute, right 02/11/2012  . CAROTID ARTERY DISEASE 12/06/2010  . Essential hypertension, benign 09/22/2009  . Chronic systolic CHF (congestive heart failure) (HCC)-EF 25% 09/14/2009  . CHEST PAIN 08/08/2009  . Mixed hyperlipidemia 05/25/2009  . CAD, NATIVE VESSEL 02/01/2009  . Ischemic cardiomyopathy 01/24/2009  . Atrial fibrillation  ([place]) 01/21/2009  . ICD (implantable cardioverter-defibrillator) in place [facility] Jude pacer/ICD) 01/21/2009    Darrel Hoover  PT 07/28/2018, 12:59 PM  [facility] [address] [place], [place], [address] Phone: [phone]   Fax:  [phone]  Name: [name] MRN: [id number] Date of Birth: [birth year]

## 2018-07-30 ENCOUNTER — Ambulatory Visit: Payer: Medicare Other

## 2018-07-30 DIAGNOSIS — R293 Abnormal posture: Secondary | ICD-10-CM | POA: Diagnosis not present

## 2018-07-30 DIAGNOSIS — M5441 Lumbago with sciatica, right side: Secondary | ICD-10-CM

## 2018-07-30 NOTE — Patient Instructions (Signed)
Red band rows and punching bilateral and across chest pull Lt and RT.   X 10-15 reps 1xday

## 2018-07-30 NOTE — Therapy (Signed)
[name], [place], [address] Phone: [phone]   Fax:  [phone]  Physical Therapy Treatment  Patient Details  Name: [name] MRN: [id number] Date of Birth: 1931-12-24 Referring Provider (PT): Eunice Blase, MD   Encounter Date: 07/30/2018  PT End of Session - 07/30/18 1144    Visit Number  8    Number of Visits  12    Date for PT Re-Evaluation  08/14/18    Authorization Type  MCR    Authorization Time Period  Progress at visit 10   KX at visit 15    PT Start Time  1143    PT Stop Time  1225    PT Time Calculation (min)  42 min    Activity Tolerance  Patient tolerated treatment well    Behavior During Therapy  [facility] for tasks assessed/performed       Past Medical History:  Diagnosis Date  . Acute bronchitis with COPD ([place]) 09/10/2016  . Anemia   . Atrial fibrillation or flutter    maintaining sinus on amiodarone    . Bladder cancer ([place]) dx'd 05/2012  . BPH (benign prostatic hyperplasia)   . CAD (coronary artery disease)     a. s/p anterior MI 12/05 c/b shock -> stent LAD   b. s/p stenting OM-1, 2/06  . CHF (congestive heart failure) (HCC)    due to ischemic CM  a. EF 20-30%. (Nov 2008)   b. s/p St. Jude BiV-ICD    c. CPX 07/2008  pvo2 16.3 (63% predicted) slope 34 RER 1.08 O2 pulse 93%  . Cough    occasional /productive, no fever/ not new  . CRI (chronic renal insufficiency)    (baseline 2.0-2.2)/ recent hospitalization 8/13  . GERD (gastroesophageal reflux disease)    hx Barretts esophagitis  . History of blood transfusion    following coumadin  usage  . HTN (hypertension)    EKG 05/14/12,Chest x ray 8/13, last ICD interrogation 8/13 EPIC  . Hyperlipidemia   . Hypothyroidism   . 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 Skin cancer    basal cells   facial x  4, 1 right forearm  . Sleep apnea    STOP BANG SCORE 4    Past Surgical History:  Procedure Laterality Date  . [place]   lower back  . CARDIAC DEFIBRILLATOR PLACEMENT     ICD St Jude; gen change 09-20-13  . CERVICAL LAMINECTOMY  1985  . COLONOSCOPY    . CORONARY ANGIOPLASTY  2005,2006  . CYSTOSCOPY W/ RETROGRADES  05/25/2012   Procedure: CYSTOSCOPY WITH RETROGRADE PYELOGRAM;  Surgeon: Molli Hazard, MD;  [facility];  Service: Urology;  Laterality: Bilateral;  . CYSTOSCOPY W/ URETERAL STENT PLACEMENT  07/08/2012   Procedure: CYSTOSCOPY WITH STENT REPLACEMENT;  Surgeon: Molli Hazard, MD;  [facility];  Service: Urology;  Laterality: Left;  . CYSTOSCOPY W/ URETERAL STENT REMOVAL  07/08/2012   Procedure: CYSTOSCOPY WITH STENT REMOVAL;  Surgeon: Molli Hazard, MD;  [facility];  Service: Urology;  Laterality: Bilateral;  . CYSTOSCOPY/RETROGRADE/URETEROSCOPY  07/08/2012   Procedure: CYSTOSCOPY/RETROGRADE/URETEROSCOPY;  Surgeon: Molli Hazard, MD;  [facility];  Service: Urology;  Laterality: Left;  . ESOPHAGOGASTRODUODENOSCOPY    . [place]   bilateral  . IMPLANTABLE  CARDIOVERTER DEFIBRILLATOR (ICD) GENERATOR CHANGE N/A 09/20/2013   Procedure: ICD GENERATOR CHANGE;  Surgeon: Deboraha Sprang, MD;  Location[facility] CATH LAB;  Service: Cardiovascular;  Laterality: N/A;  . TEE WITHOUT CARDIOVERSION N/A 12/10/2017   Procedure: TRANSESOPHAGEAL ECHOCARDIOGRAM (TEE);  Surgeon: Jerline Pain, MD;  Location: [facility] ENDOSCOPY;  Service: Cardiovascular;  Laterality: N/A;  . TRANSURETHRAL RESECTION OF BLADDER TUMOR  05/25/2012   cold cup biopsy prostate  . TRANSURETHRAL RESECTION OF BLADDER TUMOR  07/08/2012   Procedure: TRANSURETHRAL RESECTION OF BLADDER TUMOR (TURBT);  Surgeon: Molli Hazard, MD;  [facility];  Service: Urology;  Laterality: N/A;  CYSTO, TURBT W/ GYRUS, BILATERAL STENT REMOVAL, LEFT URETEROSCOPY WITH BIOPSY,  POSSIBLE LEFT STENT PLACEMENT     There were no vitals filed for this visit.  Subjective Assessment - 07/30/18 1243    Subjective  No pain . Legs still feel weak.  doing fine around home    Currently in Pain?  No/denies                       [facility] Adult PT Treatment/Exercise - 07/30/18 0001      Exercises   Exercises  Knee/Hip;Lumbar      Lumbar Exercises: Aerobic   Recumbent Bike  5 min L2       Lumbar Exercises: Seated   Other Seated Lumbar Exercises  row and punch with red band x 15 each.       Knee/Hip Exercises: Standing   Forward Lunges  Right;Left;10 reps    Forward Lunges Limitations  in bars    Side Lunges  Right;Left;10 reps    Side Lunges Limitations  in bars    Forward Step Up  Right;Left;15 reps;Step Height: 8";Hand Hold: 2      Knee/Hip Exercises: Seated   Long Arc Quad  Right;Left;20 reps    Long Arc Quad Weight  5 lbs.    Sit to Sand  --   x12 in bars UE stability     Ankle Exercises: Stretches   Slant Board Stretch  2 reps;30 seconds             PT Education - 07/30/18 1218    Education Details  HEP with bands.     Person(s) Educated  Patient;Spouse    Methods  Explanation;Demonstration;Verbal cues;Handout    Comprehension  Returned demonstration;Verbalized understanding       PT Short Term Goals - 07/30/18 1241      PT SHORT TERM GOAL #1   Title  He will be independent with initial HEp    Status  Achieved      PT SHORT TERM GOAL #2   Title  He will report pain in RT leg decr 25% or more    Status  Achieved        PT Long Term Goals - 07/30/18 1241      PT LONG TERM GOAL #1   Title  He will be independent with all hEp issued     Status  On-going      PT LONG TERM GOAL #2   Title  He will report pain as intermittant and never above 3/10 with yard work    Baseline  still limits yard work.     Status  Partially Met      PT LONG TERM GOAL #3   Title  He will demo understanding of lifting mechanics  and  limitations for lifting    Status  On-going      PT LONG TERM GOAL #4   Title  He will be able to walk as needed around property without increased pain.     Baseline  he sits and rests at time but this is normal    Status  Achieved      PT LONG TERM GOAL #5   Title  He will be able to sit for 45-60 min  without incr pain    Status  Unable to assess            Plan - 07/30/18 1144    Clinical Impression Statement  no pain today.. Tightness in calf resolved with stretching.   Tighness with LAQ most likely residual from pain in leg with back pain.   Progressing well    PT Treatment/Interventions  Passive range of motion;Dry needling;Manual techniques;Patient/family education;Therapeutic activities;Therapeutic exercise;Moist Heat;Cryotherapy;Traction;Ultrasound    PT Next Visit Plan  Continue manual therapy for right hip and lumbar spine if needed,, stretching , modalities as needed ( PACEMAKERAND DEFIBRILATOR No ES) ,  LE and core strengthening    PT Home Exercise Plan  lateal shift in standing, calf stretch.  patient has other HEP issued from other visits not listed here. Band row/punch/across body pull for stab trunk    Consulted and Agree with Plan of Care  Patient       Patient will benefit from skilled therapeutic intervention in order to improve the following deficits and impairments:  Pain, Postural dysfunction, Increased muscle spasms, Decreased activity tolerance, Difficulty walking, Decreased range of motion  Visit Diagnosis: Right-sided low back pain with right-sided sciatica, unspecified chronicity  Abnormal posture     Problem List Patient Active Problem List   Diagnosis Date Noted  . Enterococcal bacteremia 12/08/2017  . Nausea & vomiting 12/06/2017  . Renal insufficiency 12/06/2017  . Acute respiratory failure with hypoxia ([place]) 09/10/2016  . Acute bronchitis with COPD ([place]) 09/10/2016  . CKD (chronic kidney disease) stage 4, GFR 15-29 ml/min (HCC) 04/04/2015   . 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 Urosepsis 08/16/2013  . Bruising 08/12/2012  . Bladder cancer ([place]) 07/08/2012  . UTI (urinary tract infection) 05/27/2012  . Bladder tumor 05/18/2012  . Knee pain, acute, right 02/11/2012  . CAROTID ARTERY DISEASE 12/06/2010  . Essential hypertension, benign 09/22/2009  . Chronic systolic CHF (congestive heart failure) (HCC)-EF 25% 09/14/2009  . CHEST PAIN 08/08/2009  . Mixed hyperlipidemia 05/25/2009  . CAD, NATIVE VESSEL 02/01/2009  . Ischemic cardiomyopathy 01/24/2009  . Atrial fibrillation ([place]) 01/21/2009  . ICD (implantable cardioverter-defibrillator) in place [facility] Jude pacer/ICD) 01/21/2009    Darrel Hoover  PT 07/30/2018, 12:44 PM  [place], [place], [address] Phone: [phone]   Fax:  [phone]  Name: ERON STAAT MRN: [id number] Date of Birth: Jul 24, 1932

## 2018-08-04 ENCOUNTER — Ambulatory Visit: Payer: Medicare Other

## 2018-08-04 ENCOUNTER — Ambulatory Visit: Payer: Medicare Other | Admitting: Podiatry

## 2018-08-04 DIAGNOSIS — M5441 Lumbago with sciatica, right side: Secondary | ICD-10-CM

## 2018-08-04 DIAGNOSIS — I129 Hypertensive chronic kidney disease with stage 1 through stage 4 chronic kidney disease, or unspecified chronic kidney disease: Secondary | ICD-10-CM | POA: Diagnosis not present

## 2018-08-04 DIAGNOSIS — N184 Chronic kidney disease, stage 4 (severe): Secondary | ICD-10-CM | POA: Diagnosis not present

## 2018-08-04 DIAGNOSIS — R293 Abnormal posture: Secondary | ICD-10-CM | POA: Diagnosis not present

## 2018-08-04 DIAGNOSIS — D472 Monoclonal gammopathy: Secondary | ICD-10-CM | POA: Diagnosis not present

## 2018-08-04 DIAGNOSIS — I509 Heart failure, unspecified: Secondary | ICD-10-CM | POA: Diagnosis not present

## 2018-08-04 NOTE — Therapy (Signed)
[place], [place], [address] Phone: [phone]   Fax:  [phone]  Physical Therapy Treatment  Patient Details  Name: [name] MRN: [id number] Date of Birth: 1931-11-22 Referring Provider (PT): Eunice Blase, MD   Encounter Date: 08/04/2018  PT End of Session - 08/04/18 1111    Visit Number  9    Number of Visits  12    Date for PT Re-Evaluation  08/14/18    Authorization Type  MCR    Authorization Time Period  Progress at visit 10   KX at visit 15    PT Start Time  1100    PT Stop Time  1145    PT Time Calculation (min)  45 min    Activity Tolerance  Patient tolerated treatment well    Behavior During Therapy  [facility] for tasks assessed/performed       Past Medical History:  Diagnosis Date  . Acute bronchitis with COPD ([place]) 09/10/2016  . Anemia   . Atrial fibrillation or flutter    maintaining sinus on amiodarone    . Bladder cancer ([place]) dx'd 05/2012  . BPH (benign prostatic hyperplasia)   . CAD (coronary artery disease)     a. s/p anterior MI 12/05 c/b shock -> stent LAD   b. s/p stenting OM-1, 2/06  . CHF (congestive heart failure) (HCC)    due to ischemic CM  a. EF 20-30%. (Nov 2008)   b. s/p St. Jude BiV-ICD    c. CPX 07/2008  pvo2 16.3 (63% predicted) slope 34 RER 1.08 O2 pulse 93%  . Cough    occasional /productive, no fever/ not new  . CRI (chronic renal insufficiency)    (baseline 2.0-2.2)/ recent hospitalization 8/13  . GERD (gastroesophageal reflux disease)    hx Barretts esophagitis  . History of blood transfusion    following coumadin  usage  . HTN (hypertension)    EKG 05/14/12,Chest x ray 8/13, last ICD interrogation 8/13 EPIC  . Hyperlipidemia   . Hypothyroidism   . 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 Skin cancer    basal cells   facial x  4, 1 right forearm  . Sleep apnea    STOP BANG SCORE 4    Past Surgical History:  Procedure Laterality Date  . [place]   lower back  . CARDIAC DEFIBRILLATOR PLACEMENT     ICD St Jude; gen change 09-20-13  . CERVICAL LAMINECTOMY  1985  . COLONOSCOPY    . CORONARY ANGIOPLASTY  2005,2006  . CYSTOSCOPY W/ RETROGRADES  05/25/2012   Procedure: CYSTOSCOPY WITH RETROGRADE PYELOGRAM;  Surgeon: Molli Hazard, MD;  Location: [facility];  Service: Urology;  Laterality: Bilateral;  . CYSTOSCOPY W/ URETERAL STENT PLACEMENT  07/08/2012   Procedure: CYSTOSCOPY WITH STENT REPLACEMENT;  Surgeon: Molli Hazard, MD;  Location: [facility];  Service: Urology;  Laterality: Left;  . CYSTOSCOPY W/ URETERAL STENT REMOVAL  07/08/2012   Procedure: CYSTOSCOPY WITH STENT REMOVAL;  Surgeon: Molli Hazard, MD;  Location: [facility];  Service: Urology;  Laterality: Bilateral;  . CYSTOSCOPY/RETROGRADE/URETEROSCOPY  07/08/2012   Procedure: CYSTOSCOPY/RETROGRADE/URETEROSCOPY;  Surgeon: Molli Hazard, MD;  Location: [facility];  Service: Urology;  Laterality: Left;  . ESOPHAGOGASTRODUODENOSCOPY    . [place]   bilateral  . IMPLANTABLE  CARDIOVERTER DEFIBRILLATOR (ICD) GENERATOR CHANGE N/A 09/20/2013   Procedure: ICD GENERATOR CHANGE;  Surgeon: Deboraha Sprang, MD;  Location: [facility] CATH LAB;  Service: Cardiovascular;  Laterality: N/A;  . TEE WITHOUT CARDIOVERSION N/A 12/10/2017   Procedure: TRANSESOPHAGEAL ECHOCARDIOGRAM (TEE);  Surgeon: Jerline Pain, MD;  Location: [facility] ENDOSCOPY;  Service: Cardiovascular;  Laterality: N/A;  . TRANSURETHRAL RESECTION OF BLADDER TUMOR  05/25/2012   cold cup biopsy prostate  . TRANSURETHRAL RESECTION OF BLADDER TUMOR  07/08/2012   Procedure: TRANSURETHRAL RESECTION OF BLADDER TUMOR (TURBT);  Surgeon: Molli Hazard, MD;  Location: [facility];  Service: Urology;  Laterality: N/A;  CYSTO, TURBT W/ GYRUS, BILATERAL STENT REMOVAL, LEFT URETEROSCOPY WITH BIOPSY,  POSSIBLE LEFT STENT PLACEMENT     There were no vitals filed for this visit.  Subjective Assessment - 08/04/18 1110    Subjective  No back pain. Have pain somewhere every day. in past week back pain has not stopped activity . RT quad pain briefly but gone now    Currently in Pain?  No/denies                       [employer] Adult PT Treatment/Exercise - 08/04/18 0001      Exercises   Exercises  Knee/Hip;Lumbar      Lumbar Exercises: Supine   Clam  20 reps    Clam Limitations  band blue    Other Supine Lumbar Exercises  Ball squeezes x 20      Knee/Hip Exercises: Standing   Heel Raises  Both;20 reps   and toe raises   Hip Abduction  Right;Left;15 reps    Hip Extension  Left;Right   12 reps   Forward Step Up  Right;Left;15 reps;Step Height: 8";Hand Hold: 2    Other Standing Knee Exercises  hip flexion touch toe to 8 ince step x 12 RT/LT       Knee/Hip Exercises: Supine   Bridges  20 reps      LAQ 5 pounds 20 reps RTR/LT,  Sit to stand 2x10 from mat no hands         PT Short Term Goals - 07/30/18 1241      PT SHORT TERM GOAL #1   Title  He will be independent with initial HEp    Status  Achieved      PT SHORT TERM GOAL #2   Title  He will report pain in RT leg decr 25% or more    Status  Achieved        PT Long Term Goals - 07/30/18 1241      PT LONG TERM GOAL #1   Title  He will be independent with all hEp issued     Status  On-going      PT LONG TERM GOAL #2   Title  He will report pain as intermittant and never above 3/10 with yard work    Baseline  still limits yard work.     Status  Partially Met      PT LONG TERM GOAL #3   Title  He will demo understanding of lifting mechanics  and limitations for lifting    Status  On-going      PT LONG TERM GOAL #4   Title  He will be able to walk as needed around property without increased pain.     Baseline  he sits and rests at time but this is normal    Status  Achieved  PT LONG TERM  GOAL #5   Title  He will be able to sit for 45-60 min  without incr pain    Status  Unable to assess            Plan - 08/04/18 1111    Clinical Impression Statement  Doing well without pain with exercies   He has had little back pain and none interfering with function. Will cont to end of plan and probable discharrge    PT Treatment/Interventions  Passive range of motion;Dry needling;Manual techniques;Patient/family education;Therapeutic activities;Therapeutic exercise;Moist Heat;Cryotherapy;Traction;Ultrasound    PT Next Visit Plan  Review HEp . Progress strength    PT Home Exercise Plan  lateral shift in standing, calf stretch.  patient has other HEP issued from other visits not listed here. Band row/punch/across body pull for stab trunk    Consulted and Agree with Plan of Care  Patient       Patient will benefit from skilled therapeutic intervention in order to improve the following deficits and impairments:  Pain, Postural dysfunction, Increased muscle spasms, Decreased activity tolerance, Difficulty walking, Decreased range of motion  Visit Diagnosis: Right-sided low back pain with right-sided sciatica, unspecified chronicity  Abnormal posture     Problem List Patient Active Problem List   Diagnosis Date Noted  . Enterococcal bacteremia 12/08/2017  . Nausea & vomiting 12/06/2017  . Renal insufficiency 12/06/2017  . Acute respiratory failure with hypoxia ([place]) 09/10/2016  . Acute bronchitis with COPD ([place]) 09/10/2016  . CKD (chronic kidney disease) stage 4, GFR 15-29 ml/min (HCC) 04/04/2015  . 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 Urosepsis 08/16/2013  . Bruising 08/12/2012  . Bladder cancer ([place]) 07/08/2012  . UTI (urinary tract infection) 05/27/2012  . Bladder tumor 05/18/2012  . Knee pain, acute, right 02/11/2012  . CAROTID ARTERY DISEASE 12/06/2010  . Essential hypertension, benign 09/22/2009  . Chronic systolic CHF  (congestive heart failure) (HCC)-EF 25% 09/14/2009  . CHEST PAIN 08/08/2009  . Mixed hyperlipidemia 05/25/2009  . CAD, NATIVE VESSEL 02/01/2009  . Ischemic cardiomyopathy 01/24/2009  . Atrial fibrillation ([place]) 01/21/2009  . ICD (implantable cardioverter-defibrillator) in place [facility] Jude pacer/ICD) 01/21/2009    Darrel Hoover  PT 08/04/2018, 11:45 AM  [place], [place], [address] Phone: [phone]   Fax:  [phone]  Name: [name] MRN: [id number] Date of Birth: 17-Feb-1932

## 2018-08-05 ENCOUNTER — Ambulatory Visit (HOSPITAL_COMMUNITY)
Admission: RE | Admit: 2018-08-05 | Discharge: 2018-08-05 | Disposition: A | Discharge status: Home / Self Care | Payer: Medicare Other | Source: Ambulatory Visit | Attending: Internal Medicine | Admitting: Internal Medicine

## 2018-08-05 ENCOUNTER — Other Ambulatory Visit: Payer: Self-pay

## 2018-08-05 VITALS — BP 106/84 | HR 72 | Wt 183.8 lb

## 2018-08-05 DIAGNOSIS — E039 Hypothyroidism, unspecified: Secondary | ICD-10-CM | POA: Diagnosis not present

## 2018-08-05 DIAGNOSIS — E785 Hyperlipidemia, unspecified: Secondary | ICD-10-CM | POA: Insufficient documentation

## 2018-08-05 DIAGNOSIS — Z8551 Personal history of malignant neoplasm of bladder: Secondary | ICD-10-CM | POA: Insufficient documentation

## 2018-08-05 DIAGNOSIS — K219 Gastro-esophageal reflux disease without esophagitis: Secondary | ICD-10-CM | POA: Diagnosis not present

## 2018-08-05 DIAGNOSIS — I5022 Chronic systolic (congestive) heart failure: Secondary | ICD-10-CM | POA: Diagnosis not present

## 2018-08-05 DIAGNOSIS — N184 Chronic kidney disease, stage 4 (severe): Secondary | ICD-10-CM | POA: Diagnosis not present

## 2018-08-05 DIAGNOSIS — I252 Old myocardial infarction: Secondary | ICD-10-CM | POA: Insufficient documentation

## 2018-08-05 DIAGNOSIS — G4730 Sleep apnea, unspecified: Secondary | ICD-10-CM | POA: Insufficient documentation

## 2018-08-05 DIAGNOSIS — I130 Hypertensive heart and chronic kidney disease with heart failure and stage 1 through stage 4 chronic kidney disease, or unspecified chronic kidney disease: Secondary | ICD-10-CM | POA: Insufficient documentation

## 2018-08-05 DIAGNOSIS — Z955 Presence of coronary angioplasty implant and graft: Secondary | ICD-10-CM | POA: Insufficient documentation

## 2018-08-05 DIAGNOSIS — G250 Essential tremor: Secondary | ICD-10-CM | POA: Insufficient documentation

## 2018-08-05 DIAGNOSIS — Z95810 Presence of automatic (implantable) cardiac defibrillator: Secondary | ICD-10-CM | POA: Diagnosis not present

## 2018-08-05 DIAGNOSIS — Z85828 Personal history of other malignant neoplasm of skin: Secondary | ICD-10-CM | POA: Insufficient documentation

## 2018-08-05 DIAGNOSIS — Z79899 Other long term (current) drug therapy: Secondary | ICD-10-CM | POA: Diagnosis not present

## 2018-08-05 DIAGNOSIS — I2510 Atherosclerotic heart disease of native coronary artery without angina pectoris: Secondary | ICD-10-CM | POA: Diagnosis not present

## 2018-08-05 DIAGNOSIS — Z7901 Long term (current) use of anticoagulants: Secondary | ICD-10-CM | POA: Insufficient documentation

## 2018-08-05 DIAGNOSIS — I480 Paroxysmal atrial fibrillation: Secondary | ICD-10-CM | POA: Diagnosis not present

## 2018-08-05 DIAGNOSIS — K2270 Barrett's esophagus without dysplasia: Secondary | ICD-10-CM | POA: Diagnosis not present

## 2018-08-05 NOTE — Patient Instructions (Signed)
Continue current medication regimen.   Your physician recommends that you schedule a follow-up appointment in: 4-6 months

## 2018-08-05 NOTE — Progress Notes (Signed)
Patient ID: [name], male   DOB: [birth year], 84 y.o.   MRN: [id number]    Advanced Heart Failure Clinic Note   PCP: Virgina Jock  HPI: [name] is a 84 y.o. male with a history of coronary artery disease, status post previous large anterior wall myocardial infarction s/p multivessel stenting, systolic CHF with EF 21% s/p St. Jude BiV ICD.  Remainder of his medical history is notable for atrial fibrillation,maintaining sinus rhythm on amiodarone.  He is not on Coumadin secondary to GI bleed, chronic renal insufficiency with baseline creatinine about 2.2-2.5, hypertension, hyperlipidemia, and a systemic tremor.Bladder cancer 05/2012. Completed BCG treatment.   2015 found that his left ventricular lead has not been capturing suggestive of either fracture of the proximal electrode or an issue at the header. They reprogrammed the device to use to the RV coil which he tolerated well and had a good threshold.   He was admitted in 11/17 for respiratory virus.  Admitted 12/30 - 10/15/16 with A/C CHF in setting of URI. Thought to have mild CHF, so given IV diuresis in house with rapid improvement.   Admitted 2/19 with enterococcus bacteremia thought to be UTI. Treated with daptomycin. TEE EF 20-25% no vegetation mod MR  He presents today for regular follow up. Feeling OK overall. Has fallen several times since last visit. Last fall 3 weeks. Tripped down the steps and fell face first into the grass. His amiodarone has been stopped. Tremors have not gotten any better. Denies any SOB. Able to do ADLs and get around a store without difficulty. Denies lightheadedness or dizziness. Feels like his falls are from a balance issue. Now has a hand rail in the house. He is working with outpatient PT and getting somewhat stronger.   ICD:  Personally reviewed. Thoracic impedence at baseline. < 1% AT/AF burden. No VT/VF.   Echo 12/14 LVEF 20-25% Carotid u/s 3/14: 40-59% R 0-39%%L Echo 11/2017: EF 25%    Review of systems complete and found to be negative unless listed in HPI.    Past Medical History:  Diagnosis Date  . Acute bronchitis with COPD ([place]) 09/10/2016  . Anemia   . Atrial fibrillation or flutter    maintaining sinus on amiodarone    . Bladder cancer ([place]) dx'd 05/2012  . BPH (benign prostatic hyperplasia)   . CAD (coronary artery disease)     a. s/p anterior MI 12/05 c/b shock -> stent LAD   b. s/p stenting OM-1, 2/06  . CHF (congestive heart failure) (HCC)    due to ischemic CM  a. EF 20-30%. (Nov 2008)   b. s/p St. Jude BiV-ICD    c. CPX 07/2008  pvo2 16.3 (63% predicted) slope 34 RER 1.08 O2 pulse 93%  . Cough    occasional /productive, no fever/ not new  . CRI (chronic renal insufficiency)    (baseline 2.0-2.2)/ recent hospitalization 8/13  . GERD (gastroesophageal reflux disease)    hx Barretts esophagitis  . History of blood transfusion    following coumadin  usage  . HTN (hypertension)    EKG 05/14/12,Chest x ray 8/13, last ICD interrogation 8/13 EPIC  . Hyperlipidemia   . Hypothyroidism   . 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 Skin cancer    basal cells   facial x 4, 1 right forearm  . Sleep apnea  STOP BANG SCORE 4    Current Outpatient Medications  Medication Sig Dispense Refill  . acetaminophen (TYLENOL) 325 MG tablet Take 2 tablets (650 mg total) by mouth every 6 (six) hours as needed for mild pain (or Fever >/= 101).    . carvedilol (COREG) 6.25 MG tablet TAKE 1 TABLET (6.25MG ) BY MOUTH TWICE DAILY WITH A MEAL 180 tablet 1  . ELIQUIS 2.5 MG TABS tablet TAKE 1 TABLET TWICE A DAY 60 tablet 3  . ezetimibe (ZETIA) 10 MG tablet TAKE ONE TABLET BY MOUTH ONCE DAILY WITH SUPPER 90 tablet 3  . ferrous sulfate ([employer] IRON) 325 (65 FE) MG tablet Take 325 mg by mouth daily with supper.     . furosemide (LASIX) 20 MG tablet Take 2 tablets  (40 mg total) by mouth daily. 180 tablet 2  . levothyroxine (SYNTHROID, LEVOTHROID) 50 MCG tablet Take 25-50 mcg by mouth daily before breakfast. Take 50 mcg (1 tablet) on Tues, Thurs, Sat, Sun and 25 mcg (1/2 tablet) on Mon, Wed, Fri    . Magnesium Hydroxide (MILK OF MAGNESIA PO) Take 20 mLs by mouth daily as needed (for constipation).    . Multiple Vitamins-Minerals (CENTRUM SILVER PO) Take 1 tablet by mouth at bedtime.     . nitroGLYCERIN (NITROSTAT) 0.4 MG SL tablet DISSOLVE ONE TABLET UNDER THE TONGUE EVERY 5 MINUTES AS NEEDED FOR CHEST PAIN.  DO NOT EXCEED A TOTAL OF 3 DOSES IN 15 MINUTES 25 tablet 0  . pantoprazole (PROTONIX) 40 MG tablet Take 40 mg by mouth daily.     . potassium chloride (K-DUR,KLOR-CON) 10 MEQ tablet Take 1 tablet (10 mEq total) by mouth daily. 30 tablet 0  . rosuvastatin (CRESTOR) 20 MG tablet Take 1 tablet (20 mg total) by mouth daily. 90 tablet 3  . sodium chloride (OCEAN) 0.65 % SOLN nasal spray Place 1-2 sprays into both nostrils daily as needed for congestion.     . tamsulosin (FLOMAX) 0.4 MG CAPS capsule Take 0.4 mg by mouth at bedtime.      No current facility-administered medications for this encounter.    PHYSICAL EXAM: Vitals:   08/05/18 1338  BP: 106/84  Pulse: 72  SpO2: 96%  Weight: 83.4 kg (183 lb 12.8 oz)     Wt Readings from Last 3 Encounters:  08/05/18 83.4 kg (183 lb 12.8 oz)  07/01/18 79.8 kg (176 lb)  04/09/18 80.7 kg (178 lb)    General: Elderly. Well appearing. No resp difficulty. HEENT: Normal Neck: Supple. JVP 6-7 cm. Carotids 2+ bilat; no bruits. No thyromegaly or nodule noted. Cor: PMI nondisplaced. RRR, No M/G/R noted Lungs: CTAB, normal effort. Abdomen: Soft, non-tender, non-distended, no HSM. No bruits or masses. +BS  Extremities: no cyanosis, clubbing, rash, edema Neuro: alert & oriented x 3, cranial nerves grossly intact. moves all 4 extremities w/o difficulty. Affect pleasant   ASSESSMENT & PLAN: 1. Chronic systolic  CHF. Echo 10/13/2017 LVEF 20-25% (Stable from last year). Echo 2/19 EF 20-25% - NYHA II-III symptoms.  - Volume status stable on exam and Corevue.  - Continue lasix 40 mg daily.  - BP too low to add valsartan back especially with CKD IV. - Continue coreg 6.25 mg BID.  2. CAD - No s/s of ischemia.    - Off ASA due to Eliquis.  - Continue statin. Lipids followed by Dr. Virgina Jock.  3. PAF  - EKG 04/09/2018 shows NSR, AV paced.  - Now off amiodarone. Tremors have not gotten better.  -  Denies bleeding. Eliquis 2.5 mg BID.  4. Carotid ultrasound  - 1-39% bilaterally on last scan 3/15. No repeat warranted.  5. CKD, Stage IV  - Most recent 2.72. Had more recent labs drawn at [facility] 08/04/18. Told BMET was stable.  - Sees Dr. Joelyn Oms at [facility]  6. Essential Tremors  - Followed by PCP. Had a neurologic work up and negative for [employer].   - Tremors likely related to his amio.    Shirley Friar, PA-C  1:50 PM   Patient seen and examined with the above-signed Advanced Practice Provider and/or Housestaff. I personally reviewed laboratory data, imaging studies and relevant notes. I independently examined the patient and formulated the important aspects of the plan. I have edited the note to reflect any of my changes or salient points. I have personally discussed the plan with the patient and/or family.  Overall stable. NYHA II-III. Volume status looks ok. Maintaining NSR even after stopping amio however has not seen an improvement in tremor. Can consider restarting low-dose amio as needed. Tolerating Eliquis without significant bleeding. No CP or ischemic symptoms. ICD interrogated personally in clinic. No VT/AF. BP too low too titrated HF meds further. Follw renal function closely.   Glori Bickers, MD  5:55 PM

## 2018-08-06 ENCOUNTER — Ambulatory Visit: Payer: Medicare Other

## 2018-08-06 DIAGNOSIS — R293 Abnormal posture: Secondary | ICD-10-CM

## 2018-08-06 DIAGNOSIS — M5441 Lumbago with sciatica, right side: Secondary | ICD-10-CM | POA: Diagnosis not present

## 2018-08-06 NOTE — Therapy (Signed)
[place], [place], [address] Phone: [phone]   Fax:  [phone]  Physical Therapy Treatment  Patient Details  Name: [name] MRN: [id number] Date of Birth: 1932-08-04 Referring Provider (PT): Eunice Blase, MD Progress Note Reporting Period   07/06/28  to 08/06/18  See note below for Objective Data and Assessment of Progress/Goals.      Encounter Date: 08/06/2018  PT End of Session - 08/06/18 1059    Visit Number  10    Number of Visits  12    Date for PT Re-Evaluation  08/14/18    Authorization Type  MCR    Authorization Time Period  Progress at visit 10   KX at visit 15    PT Start Time  1100    PT Stop Time  1145    PT Time Calculation (min)  45 min    Activity Tolerance  Patient tolerated treatment well    Behavior During Therapy  WFL for tasks assessed/performed       Past Medical History:  Diagnosis Date  . Acute bronchitis with COPD ([place]) 09/10/2016  . Anemia   . Atrial fibrillation or flutter    maintaining sinus on amiodarone    . Bladder cancer ([place]) dx'd 05/2012  . BPH (benign prostatic hyperplasia)   . CAD (coronary artery disease)     a. s/p anterior MI 12/05 c/b shock -> stent LAD   b. s/p stenting OM-1, 2/06  . CHF (congestive heart failure) (HCC)    due to ischemic CM  a. EF 20-30%. (Nov 2008)   b. s/p St. Jude BiV-ICD    c. CPX 07/2008  pvo2 16.3 (63% predicted) slope 34 RER 1.08 O2 pulse 93%  . Cough    occasional /productive, no fever/ not new  . CRI (chronic renal insufficiency)    (baseline 2.0-2.2)/ recent hospitalization 8/13  . GERD (gastroesophageal reflux disease)    hx Barretts esophagitis  . History of blood transfusion    following coumadin  usage  . HTN (hypertension)    EKG 05/14/12,Chest x ray 8/13, last ICD interrogation 8/13 EPIC  . Hyperlipidemia   . Hypothyroidism   . 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 Skin cancer    basal cells   facial x 4, 1 right forearm  . Sleep apnea    STOP BANG SCORE 4    Past Surgical History:  Procedure Laterality Date  . [place]   lower back  . CARDIAC DEFIBRILLATOR PLACEMENT     ICD St Jude; gen change 09-20-13  . CERVICAL LAMINECTOMY  1985  . COLONOSCOPY    . CORONARY ANGIOPLASTY  2005,2006  . CYSTOSCOPY W/ RETROGRADES  05/25/2012   Procedure: CYSTOSCOPY WITH RETROGRADE PYELOGRAM;  Surgeon: Molli Hazard, MD;  Location: [facility];  Service: Urology;  Laterality: Bilateral;  . CYSTOSCOPY W/ URETERAL STENT PLACEMENT  07/08/2012   Procedure: CYSTOSCOPY WITH STENT REPLACEMENT;  Surgeon: Molli Hazard, MD;  Location: [facility];  Service: Urology;  Laterality: Left;  . CYSTOSCOPY W/ URETERAL STENT REMOVAL  07/08/2012   Procedure: CYSTOSCOPY WITH STENT REMOVAL;  Surgeon: Molli Hazard, MD;  Location: [facility];  Service: Urology;  Laterality: Bilateral;  . CYSTOSCOPY/RETROGRADE/URETEROSCOPY  07/08/2012   Procedure: CYSTOSCOPY/RETROGRADE/URETEROSCOPY;  Surgeon: Molli Hazard, MD;  Location: [facility];  Service: Urology;  Laterality: Left;  . ESOPHAGOGASTRODUODENOSCOPY    . HERNIA REPAIR  1989   bilateral  . IMPLANTABLE CARDIOVERTER DEFIBRILLATOR (ICD) GENERATOR CHANGE N/A 09/20/2013   Procedure: ICD GENERATOR CHANGE;  Surgeon: Deboraha Sprang, MD;  Location: [facility] CATH LAB;  Service: Cardiovascular;  Laterality: N/A;  . TEE WITHOUT CARDIOVERSION N/A 12/10/2017   Procedure: TRANSESOPHAGEAL ECHOCARDIOGRAM (TEE);  Surgeon: Jerline Pain, MD;  Location: [facility] ENDOSCOPY;  Service: Cardiovascular;  Laterality: N/A;  . TRANSURETHRAL RESECTION OF BLADDER TUMOR  05/25/2012   cold cup biopsy prostate  . TRANSURETHRAL RESECTION OF BLADDER TUMOR  07/08/2012   Procedure: TRANSURETHRAL RESECTION OF BLADDER TUMOR (TURBT);  Surgeon: Molli Hazard, MD;   [facility];  Service: Urology;  Laterality: N/A;  CYSTO, TURBT W/ GYRUS, BILATERAL STENT REMOVAL, LEFT URETEROSCOPY WITH BIOPSY, POSSIBLE LEFT STENT PLACEMENT     There were no vitals filed for this visit.  Subjective Assessment - 08/06/18 1101    Subjective  Doing well.   No back pain.         Currently in Pain?  No/denies                       [facility] Adult PT Treatment/Exercise - 08/06/18 0001      Lumbar Exercises: Aerobic   Recumbent Bike  6 min L2       Knee/Hip Exercises: Standing   Hip Flexion  Right;Left;15 reps    Hip Flexion Limitations  hold to 4 foot pole    Hip Abduction  Right;Left;15 reps    Abduction Limitations  hold to pole    Lateral Step Up  Right;Left   12 resp stopped quad soreness   Forward Step Up  Right;Left;15 reps;Step Height: 8";Hand Hold: 2    Other Standing Knee Exercises  walking backward x 15 feet x 5, marching with ligth resistance x 30 feet x 3.        Knee/Hip Exercises: Supine   Bridges with Diona Foley Squeeze  Both;15 reps    Bridges with Clamshell  Both;15 reps      Knee/Hip Exercises: Sidelying   Clams  blue band x 15 RT/LT     Other Sidelying Knee/Hip Exercises  ball squeeze at ankle x 15               PT Short Term Goals - 07/30/18 1241      PT SHORT TERM GOAL #1   Title  He will be independent with initial HEp    Status  Achieved      PT SHORT TERM GOAL #2   Title  He will report pain in RT leg decr 25% or more    Status  Achieved        PT Long Term Goals - 08/06/18 1104      PT LONG TERM GOAL #1   Title  He will be independent with all hEp issued     Status  On-going      PT LONG TERM GOAL #2   Title  He will report pain as intermittant and never above 3/10 with yard work      PT [place] #3   Title  He will demo understanding of lifting mechanics  and limitations for lifting      PT LONG TERM GOAL #4   Title  He will be able to walk as needed around property without increased pain.  Baseline  he sits and rests at time but this is normal    Status  Achieved      PT LONG TERM GOAL #5   Title  He will be able to sit for 45-60 min  without incr pain    Baseline  may get up to do bathroom    Status  Achieved            Plan - 08/06/18 1059    Clinical Impression Statement  Continues with no back pain but can feel strain with RT hip extension stretching. He reports he is pleased with progress and will finish POC next week. We discussed safety importance  to use cane if walking uneven terrain. Spoke to spouse as she reports he does not do exercise as much a she should and does not use cane regularly with walking on uneven terrain.  Will plan on discharge nest week  .     PT Treatment/Interventions  Passive range of motion;Dry needling;Manual techniques;Patient/family education;Therapeutic activities;Therapeutic exercise;Moist Heat;Cryotherapy;Traction;Ultrasound    PT Next Visit Plan  Review HEP  band exer. Progress strength   add step ups, sit to stand,  march holding to object, hold with abduciton. hip flexor stretch    PT Home Exercise Plan  lateral shift in standing, calf stretch.  patient has other HEP issued from other visits not listed here. Band row/punch/across body pull for stab trunk    Consulted and Agree with Plan of Care  Patient       Patient will benefit from skilled therapeutic intervention in order to improve the following deficits and impairments:  Pain, Postural dysfunction, Increased muscle spasms, Decreased activity tolerance, Difficulty walking, Decreased range of motion  Visit Diagnosis: Right-sided low back pain with right-sided sciatica, unspecified chronicity  Abnormal posture     Problem List Patient Active Problem List   Diagnosis Date Noted  . Enterococcal bacteremia 12/08/2017  . Nausea & vomiting 12/06/2017  . Renal insufficiency 12/06/2017  . Acute respiratory failure with hypoxia ([place]) 09/10/2016  . Acute bronchitis with  COPD ([place]) 09/10/2016  . CKD (chronic kidney disease) stage 4, GFR 15-29 ml/min (HCC) 04/04/2015  . 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 Urosepsis 08/16/2013  . Bruising 08/12/2012  . Bladder cancer ([place]) 07/08/2012  . UTI (urinary tract infection) 05/27/2012  . Bladder tumor 05/18/2012  . Knee pain, acute, right 02/11/2012  . CAROTID ARTERY DISEASE 12/06/2010  . Essential hypertension, benign 09/22/2009  . Chronic systolic CHF (congestive heart failure) (HCC)-EF 25% 09/14/2009  . CHEST PAIN 08/08/2009  . Mixed hyperlipidemia 05/25/2009  . CAD, NATIVE VESSEL 02/01/2009  . Ischemic cardiomyopathy 01/24/2009  . Atrial fibrillation ([place]) 01/21/2009  . ICD (implantable cardioverter-defibrillator) in place [facility] Jude pacer/ICD) 01/21/2009    Darrel Hoover  PT 08/06/2018, 11:56 AM  [place], [place], [address] Phone: [phone]   Fax:  [phone]  Name: [name] MRN: [id number] Date of Birth: 03-Mar-1932

## 2018-08-10 ENCOUNTER — Encounter: Payer: Self-pay | Admitting: Physician Assistant

## 2018-08-11 ENCOUNTER — Ambulatory Visit: Payer: Medicare Other

## 2018-08-11 DIAGNOSIS — M5441 Lumbago with sciatica, right side: Secondary | ICD-10-CM | POA: Diagnosis not present

## 2018-08-11 DIAGNOSIS — R293 Abnormal posture: Secondary | ICD-10-CM | POA: Diagnosis not present

## 2018-08-11 NOTE — Therapy (Signed)
[place], [place], [address] Phone: [phone]   Fax:  [phone]  Physical Therapy Treatment  Patient Details  Name: [name] MRN: [id number] Date of Birth: November 27, 1931 Referring Provider (PT): Eunice Blase, MD   Encounter Date: 08/11/2018  PT End of Session - 08/11/18 1100    Visit Number  11    Number of Visits  12    Date for PT Re-Evaluation  08/14/18    Authorization Type  MCR    Authorization Time Period  Progress at visit 10   KX at visit 15    PT Start Time  1100    PT Stop Time  1143    PT Time Calculation (min)  43 min    Activity Tolerance  Patient tolerated treatment well    Behavior During Therapy  [facility] for tasks assessed/performed       Past Medical History:  Diagnosis Date  . Acute bronchitis with COPD ([place]) 09/10/2016  . Anemia   . Atrial fibrillation or flutter    maintaining sinus on amiodarone    . Bladder cancer ([place]) dx'd 05/2012  . BPH (benign prostatic hyperplasia)   . CAD (coronary artery disease)     a. s/p anterior MI 12/05 c/b shock -> stent LAD   b. s/p stenting OM-1, 2/06  . CHF (congestive heart failure) (HCC)    due to ischemic CM  a. EF 20-30%. (Nov 2008)   b. s/p St. Jude BiV-ICD    c. CPX 07/2008  pvo2 16.3 (63% predicted) slope 34 RER 1.08 O2 pulse 93%  . Cough    occasional /productive, no fever/ not new  . CRI (chronic renal insufficiency)    (baseline 2.0-2.2)/ recent hospitalization 8/13  . GERD (gastroesophageal reflux disease)    hx Barretts esophagitis  . History of blood transfusion    following coumadin  usage  . HTN (hypertension)    EKG 05/14/12,Chest x ray 8/13, last ICD interrogation 8/13 EPIC  . Hyperlipidemia   . Hypothyroidism   . 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 Skin cancer    basal cells   facial x  4, 1 right forearm  . Sleep apnea    STOP BANG SCORE 4    Past Surgical History:  Procedure Laterality Date  . [place]   lower back  . CARDIAC DEFIBRILLATOR PLACEMENT     ICD St Jude; gen change 09-20-13  . CERVICAL LAMINECTOMY  1985  . COLONOSCOPY    . CORONARY ANGIOPLASTY  2005,2006  . CYSTOSCOPY W/ RETROGRADES  05/25/2012   Procedure: CYSTOSCOPY WITH RETROGRADE PYELOGRAM;  Surgeon: Molli Hazard, MD;  [facility];  Service: Urology;  Laterality: Bilateral;  . CYSTOSCOPY W/ URETERAL STENT PLACEMENT  07/08/2012   Procedure: CYSTOSCOPY WITH STENT REPLACEMENT;  Surgeon: Molli Hazard, MD;  [facility];  Service: Urology;  Laterality: Left;  . CYSTOSCOPY W/ URETERAL STENT REMOVAL  07/08/2012   Procedure: CYSTOSCOPY WITH STENT REMOVAL;  Surgeon: Molli Hazard, MD;  [facility];  Service: Urology;  Laterality: Bilateral;  . CYSTOSCOPY/RETROGRADE/URETEROSCOPY  07/08/2012   Procedure: CYSTOSCOPY/RETROGRADE/URETEROSCOPY;  Surgeon: Molli Hazard, MD;  [facility];  Service: Urology;  Laterality: Left;  . ESOPHAGOGASTRODUODENOSCOPY    . [place]   bilateral  . IMPLANTABLE  CARDIOVERTER DEFIBRILLATOR (ICD) GENERATOR CHANGE N/A 09/20/2013   Procedure: ICD GENERATOR CHANGE;  Surgeon: Deboraha Sprang, MD;  Location: [facility] CATH LAB;  Service: Cardiovascular;  Laterality: N/A;  . TEE WITHOUT CARDIOVERSION N/A 12/10/2017   Procedure: TRANSESOPHAGEAL ECHOCARDIOGRAM (TEE);  Surgeon: Jerline Pain, MD;  Location: [facility] ENDOSCOPY;  Service: Cardiovascular;  Laterality: N/A;  . TRANSURETHRAL RESECTION OF BLADDER TUMOR  05/25/2012   cold cup biopsy prostate  . TRANSURETHRAL RESECTION OF BLADDER TUMOR  07/08/2012   Procedure: TRANSURETHRAL RESECTION OF BLADDER TUMOR (TURBT);  Surgeon: Molli Hazard, MD;  [facility];  Service: Urology;  Laterality: N/A;  CYSTO, TURBT W/ GYRUS, BILATERAL STENT REMOVAL, LEFT URETEROSCOPY WITH BIOPSY,  POSSIBLE LEFT STENT PLACEMENT     There were no vitals filed for this visit.  Subjective Assessment - 08/11/18 1107    Subjective  No pain today in back    Currently in Pain?  No/denies                       [facility] Adult PT Treatment/Exercise - 08/11/18 0001      Exercises   Exercises  Knee/Hip;Lumbar      Lumbar Exercises: Aerobic   Recumbent Bike  L2 8 min       Lumbar Exercises: Seated   Sit to Stand  20 reps      Lumbar Exercises: Supine   Pelvic Tilt  10 reps;5 seconds    Clam  20 reps    Clam Limitations  band blue    Bent Knee Raise  15 reps    Bent Knee Raise Limitations  RT/LT    Bridge  15 reps    Other Supine Lumbar Exercises  Ball squeezes x 20      Knee/Hip Exercises: Standing   Hip Flexion  Right;Left;15 reps    Hip Abduction  Right;Left;15 reps    Forward Step Up  Right;Left;15 reps;Step Height: 8";Hand Hold: 2               PT Short Term Goals - 07/30/18 1241      PT SHORT TERM GOAL #1   Title  He will be independent with initial HEp    Status  Achieved      PT SHORT TERM GOAL #2   Title  He will report pain in RT leg decr 25% or more    Status  Achieved        PT Long Term Goals - 08/06/18 1104      PT LONG TERM GOAL #1   Title  He will be independent with all hEp issued     Status  On-going      PT LONG TERM GOAL #2   Title  He will report pain as intermittant and never above 3/10 with yard work      PT [place] #3   Title  He will demo understanding of lifting mechanics  and limitations for lifting      PT LONG TERM GOAL #4   Title  He will be able to walk as needed around property without increased pain.     Baseline  he sits and rests at time but this is normal    Status  Achieved      PT LONG TERM GOAL #5   Title  He will be able to sit for 45-60 min  without incr pain    Baseline  may get up to do bathroom  Status  Achieved            Plan - 08/11/18 1132    Clinical Impression  Statement  He continues to report no pain and reports ready for discharge next session. He feels he is much improved with PT.  He reports doing his HEP.     PT Treatment/Interventions  Passive range of motion;Dry needling;Manual techniques;Patient/family education;Therapeutic activities;Therapeutic exercise;Moist Heat;Cryotherapy;Traction;Ultrasound    PT Next Visit Plan  Review all HEP , band exer. Progress strength   add step ups, sit to stand,  march holding to object, hold with abduciton. hip flexor stretch    PT Home Exercise Plan  lateral shift in standing, calf stretch.  patient has other HEP issued from other visits not listed here. Band row/punch/across body pull for stab trunk    Consulted and Agree with Plan of Care  Patient       Patient will benefit from skilled therapeutic intervention in order to improve the following deficits and impairments:  Pain, Postural dysfunction, Increased muscle spasms, Decreased activity tolerance, Difficulty walking, Decreased range of motion  Visit Diagnosis: Right-sided low back pain with right-sided sciatica, unspecified chronicity  Abnormal posture     Problem List Patient Active Problem List   Diagnosis Date Noted  . Enterococcal bacteremia 12/08/2017  . Nausea & vomiting 12/06/2017  . Renal insufficiency 12/06/2017  . Acute respiratory failure with hypoxia ([place]) 09/10/2016  . Acute bronchitis with COPD ([place]) 09/10/2016  . CKD (chronic kidney disease) stage 4, GFR 15-29 ml/min (HCC) 04/04/2015  . 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 Urosepsis 08/16/2013  . Bruising 08/12/2012  . Bladder cancer ([place]) 07/08/2012  . UTI (urinary tract infection) 05/27/2012  . Bladder tumor 05/18/2012  . Knee pain, acute, right 02/11/2012  . CAROTID ARTERY DISEASE 12/06/2010  . Essential hypertension, benign 09/22/2009  . Chronic systolic CHF (congestive heart failure) (HCC)-EF 25% 09/14/2009  . CHEST PAIN  08/08/2009  . Mixed hyperlipidemia 05/25/2009  . CAD, NATIVE VESSEL 02/01/2009  . Ischemic cardiomyopathy 01/24/2009  . Atrial fibrillation ([place]) 01/21/2009  . ICD (implantable cardioverter-defibrillator) in place [facility] Jude pacer/ICD) 01/21/2009    [name]  PT 08/11/2018, 11:34 AM  [place], [place], [address] Phone: [phone]   Fax:  [phone]  Name: [name] MRN: [id number] Date of Birth: December 17, 1931

## 2018-08-13 ENCOUNTER — Ambulatory Visit: Payer: Medicare Other

## 2018-08-13 DIAGNOSIS — R293 Abnormal posture: Secondary | ICD-10-CM

## 2018-08-13 DIAGNOSIS — M5441 Lumbago with sciatica, right side: Secondary | ICD-10-CM | POA: Diagnosis not present

## 2018-08-13 NOTE — Therapy (Signed)
[place], [place], [address] Phone: [phone]   Fax:  [phone]  Physical Therapy Treatment/Discharge  Patient Details  Name: [name] MRN: [id number] Date of Birth: 09/29/1932 Referring Provider (PT): Eunice Blase, MD   Encounter Date: 08/13/2018  PT End of Session - 08/13/18 1104    Visit Number  12    Number of Visits  12    Date for PT Re-Evaluation  08/14/18    Authorization Type  MCR    Authorization Time Period  Progress at visit 10   KX at visit 15    PT Start Time  1100    PT Stop Time  1143    PT Time Calculation (min)  43 min    Activity Tolerance  Patient tolerated treatment well    Behavior During Therapy  [facility] for tasks assessed/performed       Past Medical History:  Diagnosis Date  . Acute bronchitis with COPD ([place]) 09/10/2016  . Anemia   . Atrial fibrillation or flutter    maintaining sinus on amiodarone    . Bladder cancer ([place]) dx'd 05/2012  . BPH (benign prostatic hyperplasia)   . CAD (coronary artery disease)     a. s/p anterior MI 12/05 c/b shock -> stent LAD   b. s/p stenting OM-1, 2/06  . CHF (congestive heart failure) (HCC)    due to ischemic CM  a. EF 20-30%. (Nov 2008)   b. s/p St. Jude BiV-ICD    c. CPX 07/2008  pvo2 16.3 (63% predicted) slope 34 RER 1.08 O2 pulse 93%  . Cough    occasional /productive, no fever/ not new  . CRI (chronic renal insufficiency)    (baseline 2.0-2.2)/ recent hospitalization 8/13  . GERD (gastroesophageal reflux disease)    hx Barretts esophagitis  . History of blood transfusion    following coumadin  usage  . HTN (hypertension)    EKG 05/14/12,Chest x ray 8/13, last ICD interrogation 8/13 EPIC  . Hyperlipidemia   . Hypothyroidism   . 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 Skin cancer    basal cells    facial x 4, 1 right forearm  . Sleep apnea    STOP BANG SCORE 4    Past Surgical History:  Procedure Laterality Date  . [place]   lower back  . CARDIAC DEFIBRILLATOR PLACEMENT     ICD St Jude; gen change 09-20-13  . CERVICAL LAMINECTOMY  1985  . COLONOSCOPY    . CORONARY ANGIOPLASTY  2005,2006  . CYSTOSCOPY W/ RETROGRADES  05/25/2012   Procedure: CYSTOSCOPY WITH RETROGRADE PYELOGRAM;  Surgeon: Molli Hazard, MD;  Location: [facility];  Service: Urology;  Laterality: Bilateral;  . CYSTOSCOPY W/ URETERAL STENT PLACEMENT  07/08/2012   Procedure: CYSTOSCOPY WITH STENT REPLACEMENT;  Surgeon: Molli Hazard, MD;  Location: [facility];  Service: Urology;  Laterality: Left;  . CYSTOSCOPY W/ URETERAL STENT REMOVAL  07/08/2012   Procedure: CYSTOSCOPY WITH STENT REMOVAL;  Surgeon: Molli Hazard, MD;  Location: [facility];  Service: Urology;  Laterality: Bilateral;  . CYSTOSCOPY/RETROGRADE/URETEROSCOPY  07/08/2012   Procedure: CYSTOSCOPY/RETROGRADE/URETEROSCOPY;  Surgeon: Molli Hazard, MD;  Location: [facility];  Service: Urology;  Laterality: Left;  . ESOPHAGOGASTRODUODENOSCOPY    . [place]   bilateral  . IMPLANTABLE  CARDIOVERTER DEFIBRILLATOR (ICD) GENERATOR CHANGE N/A 09/20/2013   Procedure: ICD GENERATOR CHANGE;  Surgeon: Deboraha Sprang, MD;  Location: [facility] CATH LAB;  Service: Cardiovascular;  Laterality: N/A;  . TEE WITHOUT CARDIOVERSION N/A 12/10/2017   Procedure: TRANSESOPHAGEAL ECHOCARDIOGRAM (TEE);  Surgeon: Jerline Pain, MD;  Location: [facility] ENDOSCOPY;  Service: Cardiovascular;  Laterality: N/A;  . TRANSURETHRAL RESECTION OF BLADDER TUMOR  05/25/2012   cold cup biopsy prostate  . TRANSURETHRAL RESECTION OF BLADDER TUMOR  07/08/2012   Procedure: TRANSURETHRAL RESECTION OF BLADDER TUMOR (TURBT);  Surgeon: Molli Hazard, MD;  Location: [facility];  Service: Urology;  Laterality: N/A;  CYSTO, TURBT W/ GYRUS, BILATERAL STENT REMOVAL, LEFT URETEROSCOPY WITH  BIOPSY, POSSIBLE LEFT STENT PLACEMENT     There were no vitals filed for this visit.  Subjective Assessment - 08/13/18 1204    Subjective  No back pain. HEP 4-5x/week.     Patient is accompained by:  Family member    Currently in Pain?  No/denies         [facility] PT Assessment - 08/13/18 0001      Observation/Other Assessments   Focus on Therapeutic Outcomes (FOTO)   46% limited                   [facility] Adult PT Treatment/Exercise - 08/13/18 0001      Self-Care   Other Self-Care Comments   Discussed need to stop activity if he fells tight or in awkward position to minimize risk for spasm  or strain/pain      Lumbar Exercises: Stretches   Other Lumbar Stretch Exercise  reviewed hamstring and knee to chest stretching and able to do correct.       Lumbar Exercises: Aerobic   Recumbent Bike  L2 8 min       Lumbar Exercises: Seated   Sit to Stand  10 reps    Other Seated Lumbar Exercises  reviewed band exercies and benefits for core strength more than dumbell exercise he does for bicep curls and over head press      Lumbar Exercises: Supine   Pelvic Tilt  10 reps;5 seconds    Bridge  15 reps      Knee/Hip Exercises: Standing   Other Standing Knee Exercises  reviewed step ups exercisse holding to rail for home               PT Short Term Goals - 07/30/18 1241      PT SHORT TERM GOAL #1   Title  He will be independent with initial HEp    Status  Achieved      PT SHORT TERM GOAL #2   Title  He will report pain in RT leg decr 25% or more    Status  Achieved        PT Long Term Goals - 08/13/18 1210      PT LONG TERM GOAL #1   Title  He will be independent with all hEp issued     Status  Achieved      PT LONG TERM GOAL #2   Title  He will report pain as intermittant and never above 3/10 with yard work    Status  Achieved      PT LONG TERM GOAL #3   Title  He will demo understanding of lifting mechanics  and limitations for lifting    Baseline   he is ale to do better mechainics when dued but LE strength and  decr balance limit proper mechaics    Status  Partially Met      PT LONG TERM GOAL #4   Title  He will be able to walk as needed around property without increased pain.     Status  Achieved      PT LONG TERM GOAL #5   Title  He will be able to sit for 45-60 min  without incr pain    Baseline  may get up to do bathroom    Status  Achieved            Plan - 08/13/18 1121    Clinical Impression Statement  He agrees to discharge with HEP. He is pleased with his progress. FOTO score decr to 46% limited and this is 2% points from expected at 44% limited    PT Treatment/Interventions  Passive range of motion;Dry needling;Manual techniques;Patient/family education;Therapeutic activities;Therapeutic exercise;Moist Heat;Cryotherapy;Traction;Ultrasound    PT Next Visit Plan  Discharge today    PT Home Exercise Plan  lateral shift in standing, calf stretch.  patient has other HEP issued from other visits not listed here. Band row/punch/across body pull for stab trunk    Consulted and Agree with Plan of Care  Patient       Patient will benefit from skilled therapeutic intervention in order to improve the following deficits and impairments:  Pain, Postural dysfunction, Increased muscle spasms, Decreased activity tolerance, Difficulty walking, Decreased range of motion  Visit Diagnosis: Abnormal posture  Right-sided low back pain with right-sided sciatica, unspecified chronicity     Problem List Patient Active Problem List   Diagnosis Date Noted  . Enterococcal bacteremia 12/08/2017  . Nausea & vomiting 12/06/2017  . Renal insufficiency 12/06/2017  . Acute respiratory failure with hypoxia ([place]) 09/10/2016  . Acute bronchitis with COPD ([place]) 09/10/2016  . CKD (chronic kidney disease) stage 4, GFR 15-29 ml/min (HCC) 04/04/2015  . 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 Urosepsis  08/16/2013  . Bruising 08/12/2012  . Bladder cancer ([place]) 07/08/2012  . UTI (urinary tract infection) 05/27/2012  . Bladder tumor 05/18/2012  . Knee pain, acute, right 02/11/2012  . CAROTID ARTERY DISEASE 12/06/2010  . Essential hypertension, benign 09/22/2009  . Chronic systolic CHF (congestive heart failure) (HCC)-EF 25% 09/14/2009  . CHEST PAIN 08/08/2009  . Mixed hyperlipidemia 05/25/2009  . CAD, NATIVE VESSEL 02/01/2009  . Ischemic cardiomyopathy 01/24/2009  . Atrial fibrillation ([place]) 01/21/2009  . ICD (implantable cardioverter-defibrillator) in place [facility] Jude pacer/ICD) 01/21/2009    Darrel Hoover  PT 08/13/2018, 12:18 PM  [facility] [address] [place], [place], [address] Phone: [phone]   Fax:  [phone]  Name: [name] MRN: [id number] Date of Birth: Apr 12, 1932  PHYSICAL THERAPY DISCHARGE SUMMARY  Visits from Start of Care: 12  Current functional level related to goals / functional outcomes: See above    Remaining deficits: Continues with LE weakness and decr balance but he feels he is ready to stop PT   Education / Equipment: HEP Plan: Patient agrees to discharge.  Patient goals were met. Patient is being discharged due to being pleased with the current functional level.  ?????

## 2018-08-20 DIAGNOSIS — E7849 Other hyperlipidemia: Secondary | ICD-10-CM | POA: Diagnosis not present

## 2018-08-20 DIAGNOSIS — Z125 Encounter for screening for malignant neoplasm of prostate: Secondary | ICD-10-CM | POA: Diagnosis not present

## 2018-08-20 DIAGNOSIS — I10 Essential (primary) hypertension: Secondary | ICD-10-CM | POA: Diagnosis not present

## 2018-08-20 DIAGNOSIS — R7309 Other abnormal glucose: Secondary | ICD-10-CM | POA: Diagnosis not present

## 2018-08-20 DIAGNOSIS — R82998 Other abnormal findings in urine: Secondary | ICD-10-CM | POA: Diagnosis not present

## 2018-08-20 DIAGNOSIS — I480 Paroxysmal atrial fibrillation: Secondary | ICD-10-CM | POA: Diagnosis not present

## 2018-08-20 DIAGNOSIS — E038 Other specified hypothyroidism: Secondary | ICD-10-CM | POA: Diagnosis not present

## 2018-08-27 DIAGNOSIS — I480 Paroxysmal atrial fibrillation: Secondary | ICD-10-CM | POA: Diagnosis not present

## 2018-08-27 DIAGNOSIS — I739 Peripheral vascular disease, unspecified: Secondary | ICD-10-CM | POA: Diagnosis not present

## 2018-08-27 DIAGNOSIS — I472 Ventricular tachycardia: Secondary | ICD-10-CM | POA: Diagnosis not present

## 2018-08-27 DIAGNOSIS — Z950 Presence of cardiac pacemaker: Secondary | ICD-10-CM | POA: Diagnosis not present

## 2018-08-27 DIAGNOSIS — J449 Chronic obstructive pulmonary disease, unspecified: Secondary | ICD-10-CM | POA: Diagnosis not present

## 2018-08-27 DIAGNOSIS — I509 Heart failure, unspecified: Secondary | ICD-10-CM | POA: Diagnosis not present

## 2018-08-27 DIAGNOSIS — Z1389 Encounter for screening for other disorder: Secondary | ICD-10-CM | POA: Insufficient documentation

## 2018-08-27 DIAGNOSIS — I130 Hypertensive heart and chronic kidney disease with heart failure and stage 1 through stage 4 chronic kidney disease, or unspecified chronic kidney disease: Secondary | ICD-10-CM | POA: Diagnosis not present

## 2018-08-27 DIAGNOSIS — N184 Chronic kidney disease, stage 4 (severe): Secondary | ICD-10-CM | POA: Diagnosis not present

## 2018-08-27 DIAGNOSIS — I700 Atherosclerosis of aorta: Secondary | ICD-10-CM | POA: Diagnosis not present

## 2018-08-27 DIAGNOSIS — R7989 Other specified abnormal findings of blood chemistry: Secondary | ICD-10-CM | POA: Insufficient documentation

## 2018-08-27 DIAGNOSIS — F3289 Other specified depressive episodes: Secondary | ICD-10-CM | POA: Diagnosis not present

## 2018-08-27 DIAGNOSIS — Z6827 Body mass index (BMI) 27.0-27.9, adult: Secondary | ICD-10-CM | POA: Diagnosis not present

## 2018-08-27 DIAGNOSIS — Z95810 Presence of automatic (implantable) cardiac defibrillator: Secondary | ICD-10-CM | POA: Diagnosis not present

## 2018-08-31 NOTE — Progress Notes (Signed)
Cardiology Office Note Date:  08/31/2018  Patient ID:  [name], [name] 1931-11-24, MRN [id number] PCP:  [name], MD  Cardiologist/CHF: Dr. Haroldine Laws Electrophysiologist: Dr. Caryl Comes Nephrology: Dr. Joelyn Oms    Chief Complaint: past due EP/device visit  History of Present Illness: TREYDON [name] is a 84 y.o. male with history of CAD/PCI, chronic CHF (systolic), ICD w/ICD, PAF (GIB on warfarin) on Eliquis, CRI (IV), HTN, HLD, tremor, bladder cancer treated.    I saw him 06/2017 for EP service, he was accompanied by his wife.  He reported me he is feeling well, denied any CP, palpitations or SOB, no dizziness, near syncope or syncope.  He denied symptoms of orthopnea or PND, stated being very active in/outside of his home.  He had some baseline DOE unchanged for years, says his yard as a incline and when walked out to the fence and coming back is uphill he will get winded though recovers quickly.  He denied DOE on flat surfaces.  He denied any bleeding or signs of bleeding.  He reportd he sees his PMD Q4 months, labs Q 6 months.  He has had a couple of hospitalizations this year, Jan 2019 with URI/CHF exacerbation associated with AKI/CKD, his ARB held In Feb admitted with E faecalis bacteremia from urinary source, TTE and TEE were neg for obvious vegetations, his f/u BC were neg x5 days (x2).  He saw Dr. Haroldine Laws in June 2019, titrated his amiodarone back to 100mg  QD 2/2 worse tremors.  Appeared had gotten increased during one of his stays.  Still no ARB with lower BP and stage IV renal disease.  I saw him in Sept 2019, he denied any CP, palpitations or SOB.  No DOE or symptoms or PND or orthopnea.  He denied symptoms of fluid retention.  No dizziness, near syncope or syncope.  He waswalking with a walker, this he states 2/2 to a pinched nerve after trying to lift up a oil stove/heater.  He was finishing up his second medrol dose pack, reports the pain down his hip/leg is finally  tolerable.  He c/w the tremor, unchanged despite 3 mo on lowered amio dose.  He reported some days the tremor is so bad is difficult to eat.  Not at rest, reported a couple years back saw a neurologist, dx w/essential tremor.  His wife does recall historically at the lowered amio dose it did get better but since he was last in the hospital got worse again, and not better this time with smaller dose.  He denied any bleeding or signs of bleeding.  His amio was stopped to f/u in a couple months.  He saw Dr. Haroldine Laws last month, tremors had not improved, felt could consider restarting at low dose.  BP remained to low to titrate his HF meds.  He is accompanied by his wife.  He is doing well, completed his PT and feels like he got benefit.  He and his wife feel that while the tremor is still present it is less.  Some days minimal.  He denies any CP, palpitations or cardiac awareness.  His weight is up by our scale though his home weights reported as very stable and this AM down a pound.  He does not perceive any water retention.  No dizziness, near syncope or syncope.  No shocks.  Doing well with his Eliquis without bleeding or signs of bleeding.  Device information: SJM CRT-D, implanted 02/20/07, LV lead 2009, gen change 09/21/13, Dr. Caryl Comes  Dr. Caryl Comes noted, device programed to exclude the proximal ring on the LV lead.  AAD tx:  amiodarone for AF, was down titrated 2/2 tremors that resolved on lower dose amio STOPPED  Sept 2019 with recurrent/worsening tremors  Past Medical History:  Diagnosis Date  . Acute bronchitis with COPD ([place]) 09/10/2016  . Anemia   . Atrial fibrillation or flutter    maintaining sinus on amiodarone    . Bladder cancer ([place]) dx'd 05/2012  . BPH (benign prostatic hyperplasia)   . CAD (coronary artery disease)     a. s/p anterior MI 12/05 c/b shock -> stent LAD   b. s/p stenting OM-1, 2/06  . CHF (congestive heart failure) (H[facility])    due to ischemic CM  a. EF 20-30%. (Nov 2008)    b. s/p St. Jude BiV-ICD    c. CPX 07/2008  pvo2 16.3 (63% predicted) slope 34 RER 1.08 O2 pulse 93%  . Cough    occasional /productive, no fever/ not new  . CRI (chronic renal insufficiency)    (baseline 2.0-2.2)/ recent hospitalization 8/13  . GERD (gastroesophageal reflux disease)    hx Barretts esophagitis  . History of blood transfusion    following coumadin  usage  . HTN (hypertension)    EKG 05/14/12,Chest x ray 8/13, last ICD interrogation 8/13 EPIC  . Hyperlipidemia   . Hypothyroidism   . 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 Skin cancer    basal cells   facial x 4, 1 right forearm  . Sleep apnea    STOP BANG SCORE 4    Past Surgical History:  Procedure Laterality Date  . [place]   lower back  . CARDIAC DEFIBRILLATOR PLACEMENT     ICD St Jude; gen change 09-20-13  . CERVICAL LAMINECTOMY  1985  . COLONOSCOPY    . CORONARY ANGIOPLASTY  2005,2006  . CYSTOSCOPY W/ RETROGRADES  05/25/2012   Procedure: CYSTOSCOPY WITH RETROGRADE PYELOGRAM;  Surgeon: Molli Hazard, MD;  Location: [facility];  Service: Urology;  Laterality: Bilateral;  . CYSTOSCOPY W/ URETERAL STENT PLACEMENT  07/08/2012   Procedure: CYSTOSCOPY WITH STENT REPLACEMENT;  Surgeon: Molli Hazard, MD;  Location: [facility];  Service: Urology;  Laterality: Left;  . CYSTOSCOPY W/ URETERAL STENT REMOVAL  07/08/2012   Procedure: CYSTOSCOPY WITH STENT REMOVAL;  Surgeon: Molli Hazard, MD;  Location: [facility];  Service: Urology;  Laterality: Bilateral;  . CYSTOSCOPY/RETROGRADE/URETEROSCOPY  07/08/2012   Procedure: CYSTOSCOPY/RETROGRADE/URETEROSCOPY;  Surgeon: Molli Hazard, MD;  Location: [facility];  Service: Urology;  Laterality: Left;  . ESOPHAGOGASTRODUODENOSCOPY    . HERNIA REPAIR  1989   bilateral  . IMPLANTABLE CARDIOVERTER DEFIBRILLATOR (ICD) GENERATOR CHANGE N/A  09/20/2013   Procedure: ICD GENERATOR CHANGE;  Surgeon: Deboraha Sprang, MD;  Location: [facility] CATH LAB;  Service: Cardiovascular;  Laterality: N/A;  . TEE WITHOUT CARDIOVERSION N/A 12/10/2017   Procedure: TRANSESOPHAGEAL ECHOCARDIOGRAM (TEE);  Surgeon: Jerline Pain, MD;  Location[facility] ENDOSCOPY;  Service: Cardiovascular;  Laterality: N/A;  . TRANSURETHRAL RESECTION OF BLADDER TUMOR  05/25/2012   cold cup biopsy prostate  . TRANSURETHRAL RESECTION OF BLADDER TUMOR  07/08/2012   Procedure: TRANSURETHRAL RESECTION OF BLADDER TUMOR (TURBT);  Surgeon: Molli Hazard, MD;  Location: [facility];  Service: Urology;  Laterality: N/A;  CYSTO, TURBT W/ GYRUS, BILATERAL STENT REMOVAL, LEFT  URETEROSCOPY WITH BIOPSY, POSSIBLE LEFT STENT PLACEMENT     Current Outpatient Medications  Medication Sig Dispense Refill  . acetaminophen (TYLENOL) 325 MG tablet Take 2 tablets (650 mg total) by mouth every 6 (six) hours as needed for mild pain (or Fever >/= 101).    . carvedilol (COREG) 6.25 MG tablet TAKE 1 TABLET (6.25MG ) BY MOUTH TWICE DAILY WITH A MEAL 180 tablet 1  . ELIQUIS 2.5 MG TABS tablet TAKE 1 TABLET TWICE A DAY 60 tablet 3  . ezetimibe (ZETIA) 10 MG tablet TAKE ONE TABLET BY MOUTH ONCE DAILY WITH SUPPER 90 tablet 3  . ferrous sulfate ([employer]S IRON) 325 (65 FE) MG tablet Take 325 mg by mouth daily with supper.     . furosemide (LASIX) 20 MG tablet Take 2 tablets (40 mg total) by mouth daily. 180 tablet 2  . levothyroxine (SYNTHROID, LEVOTHROID) 50 MCG tablet Take 25-50 mcg by mouth daily before breakfast. Take 50 mcg (1 tablet) on Tues, Thurs, Sat, Sun and 25 mcg (1/2 tablet) on Mon, Wed, Fri    . Magnesium Hydroxide (MILK OF MAGNESIA PO) Take 20 mLs by mouth daily as needed (for constipation).    . Multiple Vitamins-Minerals (CENTRUM SILVER PO) Take 1 tablet by mouth at bedtime.     . nitroGLYCERIN (NITROSTAT) 0.4 MG SL tablet DISSOLVE ONE TABLET UNDER THE TONGUE EVERY 5 MINUTES AS NEEDED FOR CHEST PAIN.  DO NOT  EXCEED A TOTAL OF 3 DOSES IN 15 MINUTES 25 tablet 0  . pantoprazole (PROTONIX) 40 MG tablet Take 40 mg by mouth daily.     . potassium chloride (K-DUR,KLOR-CON) 10 MEQ tablet Take 1 tablet (10 mEq total) by mouth daily. 30 tablet 0  . rosuvastatin (CRESTOR) 20 MG tablet Take 1 tablet (20 mg total) by mouth daily. 90 tablet 3  . sodium chloride (OCEAN) 0.65 % SOLN nasal spray Place 1-2 sprays into both nostrils daily as needed for congestion.     . tamsulosin (FLOMAX) 0.4 MG CAPS capsule Take 0.4 mg by mouth at bedtime.      No current facility-administered medications for this visit.     Allergies:   Ramipril and Penicillins   Social History:  The patient  reports that he quit smoking about 14 years ago. His smoking use included cigarettes and cigars. He quit smokeless tobacco use about 19 years ago. He reports that he does not drink alcohol or use drugs.   Family History:  The patient's family history includes Coronary artery disease in his unknown relative; Stroke in his unknown relative.  ROS:  Please see the history of present illness.  All other systems are reviewed and otherwise negative.   PHYSICAL EXAM:  VS:  There were no vitals taken for this visit. BMI: There is no height or weight on file to calculate BMI. Well nourished, well developed, in no acute distress  HEENT: normocephalic, atraumatic  Neck: no JVD, carotid bruits or masses Cardiac: RRR; no significant murmurs, no rubs, or gallops Lungs:  CTA b/l  no wheezing, rhonchi or rales  Abd: soft, nontender MS: no deformity, age appropriate atrophy Ext:  no edema, left index finger amputated Skin: warm and dry, no rash Neuro:  No gross deficits appreciated Psych: euthymic mood, full affect  ICD site is stable, no tethering or discomfort   EKG:  Not done today ICD interrogation done today by and reviewed by myself: battery and lead measurements are good, no VT, 4 AMS episodes, longest 11 min  TEE: 12/10/2017 - Left  ventricle: Systolic function was severely reduced. The estimated ejection fraction was in the range of 20% to 25%. Wall motion was normal; there were no regional wall motion abnormalities. - Aortic valve: There was trivial regurgitation. - Mitral valve: There was moderate regurgitation. - Poor images, likely inhibited by moderate hiatal hernia. However, no gross evidence of vegetation.  ECHO: 12/08/2017 - Left ventricle: The cavity size was moderately dilated. Wall thickness was normal. Systolic function was severely reduced. The estimated ejection fraction was in the range of 20% to 25%. Severe diffuse hypokinesis with distinct regional wall motion abnormalities. Dyskinesis and scarring of the apical myocardium. Akinesis and scarring of the anteroseptal and anterior myocardium; consistent with infarction in the distribution of the left anterior descending coronary artery. - Ventricular septum: Septal motion showed abnormal function, dyssynergy, and paradox. These changes are consistent with intraventricular conduction delay. - Aortic valve: There was mild regurgitation. - Mitral valve: There was mild to moderate regurgitation directed centrally. - Left atrium: The atrium was mildly dilated. - Right atrium: The atrium was mildly dilated. - Atrial septum: No defect or patent foramen ovale was identified. Impressions: - There was no evidence of a vegetation.   10/19/15: TTE Study Conclusions - Left ventricle: The cavity size was mildly dilated. Wall   thickness was normal. Systolic function was severely reduced. The   estimated ejection fraction was in the range of 25% to 30%.   Diffuse hypokinesis. There is akinesis of the anteroseptal and   apical myocardium. Doppler parameters are consistent with   abnormal left ventricular relaxation (grade 1 diastolic   dysfunction). - Left atrium: The atrium was mildly dilated. Impressions: - Global  hyokinesis with akinesis of the anteroseptal wall and   apex; grade 1 diastolic dysfunction; mild LAE; trace MR and TR.  PCI: 11/20/2004 PROCEDURE NOTE: A 6-French sheath was placed in the right femoral artery. Angiomax was administered per protocol. We used a 6-French CLS 3.5 guidingcatheter. We initially performed angiography of the left anterior descending artery. This revealed the stent in proximal LAD to be widely patent with proximal 20% stenosis within the mid portion of stent. The diagonal branch was also widely patent. The mid to distal LAD showed mild luminal irregularities. Angiography of the left circumflex revealed a complex 80% stenosis in the proximal portion of the first obtuse marginal branch. An Asahi soft coronary guidewire was advanced under fluoroscopic guidance into the distal aspect of the obtuse marginal branch. We then performed PTCA with a 2.5 x 12 mm Quantum balloon inflated to 10 atmospheres. Following this, we deployed a 2.5 x 13 mm Cypher stent at a deployment pressure of 11 atmospheres. We then went back with our 2.5 x 30mm Quantum balloon and inflated this to 15 atmospheres within the stent. Intermittent doses of intracoronary nitroglycerin were administered. Final angiographic images were obtained showing patency of the obtuse marginal with 0% residual stenosis at the stent site and TIMI III flow. At the conclusion of procedure, an AngioSeal vascular closure device was placed in the right femoral artery with good hemostasis. RESULTS: Successful PTCA placement of a drug-eluting stent in the first obtuse marginal branch. An 80% stenosis was reduced to 0% residual with TIMI III flow.  CATH: 09/18/2004  Coronary angiography Left main has a 20% stenosis. Left anterior descending artery is widely patent at the stent site and the proximal and mid LAD with 0% stenosis within the stent and TIMI III flow into the distal  vessel. The  LAD gives rise to a small first diagonal and large second diagonal branch. The second diagonal branch arises from within the stented segment of the LAD. There is a residual 30% stenosis in the ostium of the second diagonal branch with TIMI III flow into this vessel. Left circumflex is unchanged from before with a 50% stenosis in the ostium and an 80% stenosis in the proximal portion of a large first obtuse marginal branch. IMPRESSION: 1. Elevated left ventricular end diastolic pressure. 2. Patent stent in the left anterior descending artery and patent angioplasty site of the second diagonal branch. 3. No change in disease in the left circumflex as described.   Recent Labs: 12/06/2017: B Natriuretic Peptide 400.9 12/12/2017: Magnesium 2.4 04/09/2018: ALT 103; BUN 37; Creatinine, Ser 2.72; Hemoglobin 13.1; Platelets 198; Potassium 4.6; Sodium 142; TSH 1.221  No results found for requested labs within last 8760 hours.   CrCl cannot be calculated (Patient's most recent lab result is older than the maximum 21 days allowed.).   Wt Readings from Last 3 Encounters:  08/05/18 183 lb 12.8 oz (83.4 kg)  07/01/18 176 lb (79.8 kg)  04/09/18 178 lb (80.7 kg)     Other studies reviewed: Additional studies/records reviewed today include: summarized above  ASSESSMENT AND PLAN:  1. ICD    Normal function, no changes made  2. Chronic CHF (systolic)     Exam,  CorVu do not suggest fluid OL, pt reports home weights are stable     C/w Dr. Haroldine Laws and AHF team  3. CAD     no anginal complaints     On BB, statin, labs managed with his PMD  4. Paroxysmal AFib     CHA2DS2Vasc is at least 4, on Eliquis (appropriately dosed at 2.5mg  for Creat and age)     [facility] notes 11/7/`19 Creat 2.6, Hgb 13     Will go ahead and stay off the amio since they do feel there has been improvement in his tremor         Disposition: he sees his PMD, nephrologist and Dr. Haroldine Laws  regularly, we will continue 3 month remotes and see him back in-clinic I 1 year, sooner if needed.  Current medicines are reviewed at length with the patient today.  The patient did not have any concerns regarding medicines.  Haywood Lasso, PA-C 08/31/2018 10:38 AM     [facility] [place] [address] [phone] (office)  [phone] (fax)

## 2018-09-01 ENCOUNTER — Ambulatory Visit (INDEPENDENT_AMBULATORY_CARE_PROVIDER_SITE_OTHER): Payer: Medicare Other | Admitting: Physician Assistant

## 2018-09-01 VITALS — BP 112/64 | HR 60 | Ht 70.0 in | Wt 183.0 lb

## 2018-09-01 DIAGNOSIS — I2510 Atherosclerotic heart disease of native coronary artery without angina pectoris: Secondary | ICD-10-CM | POA: Diagnosis not present

## 2018-09-01 DIAGNOSIS — I255 Ischemic cardiomyopathy: Secondary | ICD-10-CM

## 2018-09-01 DIAGNOSIS — Z95810 Presence of automatic (implantable) cardiac defibrillator: Secondary | ICD-10-CM

## 2018-09-01 DIAGNOSIS — I480 Paroxysmal atrial fibrillation: Secondary | ICD-10-CM | POA: Diagnosis not present

## 2018-09-01 DIAGNOSIS — I5022 Chronic systolic (congestive) heart failure: Secondary | ICD-10-CM

## 2018-09-01 NOTE — Patient Instructions (Addendum)
Medication Instructions:  Your physician recommends that you continue on your current medications as directed. Please refer to the Current Medication list given to you today.  If you need a refill on your cardiac medications before your next appointment, please call your pharmacy.   Lab work:  NONE ORDERED  TODAY  If you have labs (blood work) drawn today and your tests are completely normal, you will receive your results only by: Marland Kitchen MyChart Message (if you have MyChart) OR . A paper copy in the mail If you have any lab test that is abnormal or we need to change your treatment, we will call you to review the results.  Testing/Procedures:  NONE ORDERED  TODAY   Follow-Up: At [facility], you and your health needs are our priority.  As part of our continuing mission to provide you with exceptional heart care, we have created designated Provider Care Teams.  These Care Teams include your primary Cardiologist (physician) and Advanced Practice Providers (APPs -  Physician Assistants and Nurse Practitioners) who all work together to provide you with the care you need, when you need it. You will need a follow up appointment in 12 months.  Please call our office 2 months in advance to schedule this appointment.  You may see Dr. Caryl Comes or one of the following Advanced Practice Providers on your designated Care Team:   Chanetta Marshall, NP . Tommye Standard, PA-C  Remote monitoring is used to monitor your Pacemaker of ICD from home. This monitoring reduces the number of office visits required to check your device to one time per year. It allows [place] to keep an eye on the functioning of your device to ensure it is working properly. You are scheduled for a device check from home on .10-21-18 You may send your transmission at any time that day. If you have a wireless device, the transmission will be sent automatically. After your physician reviews your transmission, you will receive a postcard with your next  transmission date.    Any Other Special Instructions Will Be Listed Below (If Applicable).

## 2018-09-07 DIAGNOSIS — R3915 Urgency of urination: Secondary | ICD-10-CM | POA: Diagnosis not present

## 2018-09-07 DIAGNOSIS — C670 Malignant neoplasm of trigone of bladder: Secondary | ICD-10-CM | POA: Diagnosis not present

## 2018-09-18 ENCOUNTER — Ambulatory Visit (INDEPENDENT_AMBULATORY_CARE_PROVIDER_SITE_OTHER): Payer: Medicare Other | Admitting: Podiatry

## 2018-09-18 DIAGNOSIS — Z9229 Personal history of other drug therapy: Secondary | ICD-10-CM

## 2018-09-18 DIAGNOSIS — L84 Corns and callosities: Secondary | ICD-10-CM | POA: Diagnosis not present

## 2018-09-18 DIAGNOSIS — M79675 Pain in left toe(s): Secondary | ICD-10-CM

## 2018-09-18 DIAGNOSIS — B351 Tinea unguium: Secondary | ICD-10-CM | POA: Diagnosis not present

## 2018-09-18 DIAGNOSIS — M79674 Pain in right toe(s): Secondary | ICD-10-CM | POA: Diagnosis not present

## 2018-09-18 NOTE — Patient Instructions (Signed)

## 2018-09-22 DIAGNOSIS — D485 Neoplasm of uncertain behavior of skin: Secondary | ICD-10-CM | POA: Diagnosis not present

## 2018-09-22 DIAGNOSIS — D692 Other nonthrombocytopenic purpura: Secondary | ICD-10-CM | POA: Diagnosis not present

## 2018-09-22 DIAGNOSIS — L570 Actinic keratosis: Secondary | ICD-10-CM | POA: Diagnosis not present

## 2018-09-22 DIAGNOSIS — L853 Xerosis cutis: Secondary | ICD-10-CM | POA: Diagnosis not present

## 2018-09-22 DIAGNOSIS — Z85828 Personal history of other malignant neoplasm of skin: Secondary | ICD-10-CM | POA: Diagnosis not present

## 2018-09-22 DIAGNOSIS — L821 Other seborrheic keratosis: Secondary | ICD-10-CM | POA: Diagnosis not present

## 2018-09-22 DIAGNOSIS — C44219 Basal cell carcinoma of skin of left ear and external auricular canal: Secondary | ICD-10-CM | POA: Diagnosis not present

## 2018-09-29 ENCOUNTER — Other Ambulatory Visit (HOSPITAL_COMMUNITY): Payer: Self-pay | Admitting: Internal Medicine

## 2018-10-02 LAB — CUP PACEART REMOTE DEVICE CHECK
Battery Remaining Longevity: 16 mo
Battery Remaining Percentage: 24 %
Battery Voltage: 2.9 V
Brady Statistic AP VP Percent: 99 %
Brady Statistic AP VS Percent: 1 %
Brady Statistic RA Percent Paced: 99 %
Date Time Interrogation Session: 20191009061353
HIGH POWER IMPEDANCE MEASURED VALUE: 46 Ohm
HIGH POWER IMPEDANCE MEASURED VALUE: 46 Ohm
Implantable Lead Implant Date: 20060509
Implantable Lead Implant Date: 20060509
Implantable Lead Location: 753858
Implantable Lead Location: 753859
Lead Channel Impedance Value: 350 Ohm
Lead Channel Pacing Threshold Amplitude: 1.125 V
Lead Channel Pacing Threshold Amplitude: 1.125 V
Lead Channel Pacing Threshold Pulse Width: 0.8 ms
Lead Channel Pacing Threshold Pulse Width: 0.8 ms
Lead Channel Sensing Intrinsic Amplitude: 9.8 mV
Lead Channel Setting Pacing Amplitude: 2.125
Lead Channel Setting Sensing Sensitivity: 0.5 mV
MDC IDC LEAD IMPLANT DT: 20090417
MDC IDC LEAD LOCATION: 753860
MDC IDC MSMT LEADCHNL LV IMPEDANCE VALUE: 400 Ohm
MDC IDC MSMT LEADCHNL RA IMPEDANCE VALUE: 380 Ohm
MDC IDC MSMT LEADCHNL RA PACING THRESHOLD AMPLITUDE: 1 V
MDC IDC MSMT LEADCHNL RA PACING THRESHOLD PULSEWIDTH: 0.5 ms
MDC IDC MSMT LEADCHNL RA SENSING INTR AMPL: 4.8 mV
MDC IDC PG IMPLANT DT: 20141208
MDC IDC SET LEADCHNL LV PACING PULSEWIDTH: 0.8 ms
MDC IDC SET LEADCHNL RA PACING AMPLITUDE: 2 V
MDC IDC SET LEADCHNL RV PACING AMPLITUDE: 2.125
MDC IDC SET LEADCHNL RV PACING PULSEWIDTH: 0.8 ms
MDC IDC STAT BRADY AS VP PERCENT: 1 %
MDC IDC STAT BRADY AS VS PERCENT: 1 %
Pulse Gen Serial Number: 7138995

## 2018-10-13 DIAGNOSIS — H6522 Chronic serous otitis media, left ear: Secondary | ICD-10-CM | POA: Diagnosis not present

## 2018-10-13 DIAGNOSIS — H906 Mixed conductive and sensorineural hearing loss, bilateral: Secondary | ICD-10-CM | POA: Diagnosis not present

## 2018-10-18 ENCOUNTER — Encounter: Payer: Self-pay | Admitting: Podiatry

## 2018-10-18 NOTE — Progress Notes (Signed)
Subjective: [name] is a 85 y.o. y.o. male who presents today with painful, discolored, thick toenails and painful calluses b/l which interfere with daily activities. Pain is aggravated when wearing enclosed shoe gear and relieved with periodic professional debridement.  [name], MD is his PCP   Current Outpatient Medications:  .  acetaminophen (TYLENOL) 325 MG tablet, Take 2 tablets (650 mg total) by mouth every 6 (six) hours as needed for mild pain (or Fever >/= 101)., Disp: , Rfl:  .  carvedilol (COREG) 6.25 MG tablet, TAKE 1 TABLET (6.25MG ) BY MOUTH TWICE DAILY WITH A MEAL, Disp: 180 tablet, Rfl: 1 .  ezetimibe (ZETIA) 10 MG tablet, TAKE ONE TABLET BY MOUTH ONCE DAILY WITH SUPPER, Disp: 90 tablet, Rfl: 3 .  ferrous sulfate ([employer]S IRON) 325 (65 FE) MG tablet, Take 325 mg by mouth daily with supper. , Disp: , Rfl:  .  furosemide (LASIX) 20 MG tablet, Take 2 tablets (40 mg total) by mouth daily., Disp: 180 tablet, Rfl: 2 .  levothyroxine (SYNTHROID, LEVOTHROID) 50 MCG tablet, Take 25-50 mcg by mouth daily before breakfast. Take 50 mcg (1 tablet) on Tues, Thurs, Sat, Sun and 25 mcg (1/2 tablet) on Mon, Wed, Fri, Disp: , Rfl:  .  Magnesium Hydroxide (MILK OF MAGNESIA PO), Take 20 mLs by mouth daily as needed (for constipation)., Disp: , Rfl:  .  Multiple Vitamins-Minerals (CENTRUM SILVER PO), Take 1 tablet by mouth at bedtime. , Disp: , Rfl:  .  nitroGLYCERIN (NITROSTAT) 0.4 MG SL tablet, DISSOLVE ONE TABLET UNDER THE TONGUE EVERY 5 MINUTES AS NEEDED FOR CHEST PAIN.  DO NOT EXCEED A TOTAL OF 3 DOSES IN 15 MINUTES, Disp: 25 tablet, Rfl: 0 .  pantoprazole (PROTONIX) 40 MG tablet, Take 40 mg by mouth daily. , Disp: , Rfl:  .  potassium chloride (K-DUR,KLOR-CON) 10 MEQ tablet, Take 1 tablet (10 mEq total) by mouth daily., Disp: 30 tablet, Rfl: 0 .  rosuvastatin (CRESTOR) 20 MG tablet, Take 1 tablet (20 mg total) by mouth daily., Disp: 90 tablet, Rfl: 3 .  sodium chloride (OCEAN) 0.65 %  SOLN nasal spray, Place 1-2 sprays into both nostrils daily as needed for congestion. , Disp: , Rfl:  .  tamsulosin (FLOMAX) 0.4 MG CAPS capsule, Take 0.4 mg by mouth at bedtime. , Disp: , Rfl:  .  ELIQUIS 2.5 MG TABS tablet, TAKE 1 TABLET TWICE A DAY, Disp: 60 tablet, Rfl: 5   Allergies  Allergen Reactions  . Ramipril Other (See Comments)    cough  . Penicillins Itching and Rash    Has patient had a PCN reaction causing immediate rash, facial/tongue/throat swelling, SOB or lightheadedness with hypotension: No Has patient had a PCN reaction causing severe rash involving mucus membranes or skin necrosis: Yes-back and hands. Has patient had a PCN reaction that required hospitalization: No Has patient had a PCN reaction occurring within the last 10 years: Yes If all of the above answers are "NO", then may proceed with Cephalosporin use.     Objective: Vascular Examination: Capillary refill time <3 seconds x 10 digits Dorsalis pedis pulses and Posterior tibial pulses  b/l No digital hair x 10 digits Skin temperature gradient WNL b/l  Dermatological Examination: Skin with normal turgor, texture and tone b/l  Toenails 1-5 b/l discolored, thick, dystrophic with subungual debris and pain with palpation to nailbeds due to thickness of nails.  Spine callus noted submetatarsal head 1 b/l  Musculoskeletal: Muscle strength 5/5 to all LE  muscle groups  Hammertoes 2-5 b/l  Neurological: Sensation intact with 10 gram monofilament.  Vibratory sensation intact.  Assessment: 1.  Painful onychomycosis toenails 1-5 b/l in patient on blood thinner.  2.  Calluses submet head 1 b/l   Plan: 1. Toenails 1-5 b/l were debrided in length and girth without iatrogenic bleeding. 2. Hyperkeratotic lesion debrided submet head 1 b/l utilizing sterile chisel blade. 3. Patient to continue soft, supportive shoe gear 4. Patient to report any pedal injuries to medical professional immediately. 5. Avoid  self trimming due to use of blood thinner. 6. Follow up 3 months. Patient/POA to call should there be a concern in the interim.

## 2018-10-19 ENCOUNTER — Other Ambulatory Visit: Payer: Self-pay | Admitting: Internal Medicine

## 2018-10-19 DIAGNOSIS — I509 Heart failure, unspecified: Secondary | ICD-10-CM | POA: Diagnosis not present

## 2018-10-19 DIAGNOSIS — N184 Chronic kidney disease, stage 4 (severe): Secondary | ICD-10-CM | POA: Diagnosis not present

## 2018-10-19 DIAGNOSIS — D472 Monoclonal gammopathy: Secondary | ICD-10-CM | POA: Diagnosis not present

## 2018-10-19 DIAGNOSIS — I129 Hypertensive chronic kidney disease with stage 1 through stage 4 chronic kidney disease, or unspecified chronic kidney disease: Secondary | ICD-10-CM | POA: Diagnosis not present

## 2018-10-21 ENCOUNTER — Ambulatory Visit (INDEPENDENT_AMBULATORY_CARE_PROVIDER_SITE_OTHER): Payer: Medicare Other

## 2018-10-21 DIAGNOSIS — I255 Ischemic cardiomyopathy: Secondary | ICD-10-CM

## 2018-10-21 DIAGNOSIS — I5022 Chronic systolic (congestive) heart failure: Secondary | ICD-10-CM

## 2018-10-22 LAB — CUP PACEART REMOTE DEVICE CHECK
Battery Voltage: 2.87 V
Brady Statistic AP VP Percent: 97 %
Brady Statistic AP VS Percent: 1 %
Brady Statistic AS VP Percent: 2.9 %
Brady Statistic RA Percent Paced: 97 %
HighPow Impedance: 48 Ohm
HighPow Impedance: 48 Ohm
Implantable Lead Implant Date: 20060509
Implantable Lead Location: 753858
Implantable Lead Location: 753860
Implantable Lead Model: 5076
Implantable Lead Model: 7001
Lead Channel Impedance Value: 450 Ohm
Lead Channel Impedance Value: 460 Ohm
Lead Channel Pacing Threshold Amplitude: 1 V
Lead Channel Pacing Threshold Amplitude: 1 V
Lead Channel Pacing Threshold Pulse Width: 0.5 ms
Lead Channel Pacing Threshold Pulse Width: 0.8 ms
Lead Channel Sensing Intrinsic Amplitude: 12 mV
Lead Channel Setting Pacing Amplitude: 2 V
Lead Channel Setting Pacing Amplitude: 2 V
Lead Channel Setting Pacing Pulse Width: 0.8 ms
Lead Channel Setting Pacing Pulse Width: 0.8 ms
Lead Channel Setting Sensing Sensitivity: 0.5 mV
MDC IDC LEAD IMPLANT DT: 20060509
MDC IDC LEAD IMPLANT DT: 20090417
MDC IDC LEAD LOCATION: 753859
MDC IDC MSMT BATTERY REMAINING LONGEVITY: 19 mo
MDC IDC MSMT BATTERY REMAINING PERCENTAGE: 27 %
MDC IDC MSMT LEADCHNL LV PACING THRESHOLD PULSEWIDTH: 0.8 ms
MDC IDC MSMT LEADCHNL RA PACING THRESHOLD AMPLITUDE: 0.75 V
MDC IDC MSMT LEADCHNL RA SENSING INTR AMPL: 3.2 mV
MDC IDC MSMT LEADCHNL RV IMPEDANCE VALUE: 380 Ohm
MDC IDC PG IMPLANT DT: 20141208
MDC IDC SESS DTM: 20200108070015
MDC IDC SET LEADCHNL RV PACING AMPLITUDE: 2 V
MDC IDC STAT BRADY AS VS PERCENT: 1 %
Pulse Gen Serial Number: 7138995

## 2018-10-22 NOTE — Progress Notes (Signed)
Remote ICD transmission.   

## 2018-10-30 ENCOUNTER — Encounter: Payer: Self-pay | Admitting: Podiatry

## 2018-10-30 ENCOUNTER — Ambulatory Visit (INDEPENDENT_AMBULATORY_CARE_PROVIDER_SITE_OTHER): Payer: Medicare Other | Admitting: Podiatry

## 2018-10-30 VITALS — Temp 96.5°F

## 2018-10-30 DIAGNOSIS — L600 Ingrowing nail: Secondary | ICD-10-CM

## 2018-11-15 ENCOUNTER — Encounter: Payer: Self-pay | Admitting: Podiatry

## 2018-11-15 NOTE — Progress Notes (Signed)
Subjective:  Patient presents back to clinic with cc of  Painful left great toe which he thinks is infected. Patient denies any drainage, but says it's been tender since he left and never did get better. His wife has soaked it and it provided some relief, but pain recurs after a few daysl  [name] Baton, [name] is his PCP.   Current Outpatient Medications:  .  acetaminophen (TYLENOL) 325 MG tablet, Take 2 tablets (650 mg total) by mouth every 6 (six) hours as needed for mild pain (or Fever >/= 101)., Disp: , Rfl:  .  carvedilol (COREG) 6.25 MG tablet, TAKE 1 TABLET (6.25MG ) BY MOUTH TWICE DAILY WITH A MEAL, Disp: 180 tablet, Rfl: 1 .  ELIQUIS 2.5 MG TABS tablet, TAKE 1 TABLET TWICE A DAY, Disp: 60 tablet, Rfl: 5 .  ezetimibe (ZETIA) 10 MG tablet, TAKE ONE TABLET BY MOUTH ONCE DAILY WITH SUPPER, Disp: 90 tablet, Rfl: 3 .  ferrous sulfate ([employer] IRON) 325 (65 FE) MG tablet, Take 325 mg by mouth daily with supper. , Disp: , Rfl:  .  fluticasone (FLONASE) 50 MCG/ACT nasal spray, , Disp: , Rfl:  .  furosemide (LASIX) 20 MG tablet, Take 2 tablets (40 mg total) by mouth daily., Disp: 180 tablet, Rfl: 2 .  levothyroxine (SYNTHROID, LEVOTHROID) 50 MCG tablet, Take 25-50 mcg by mouth daily before breakfast. Take 50 mcg (1 tablet) on Tues, Thurs, Sat, Sun and 25 mcg (1/2 tablet) on Mon, Wed, Fri, Disp: , Rfl:  .  Magnesium Hydroxide (MILK OF MAGNESIA PO), Take 20 mLs by mouth daily as needed (for constipation)., Disp: , Rfl:  .  Multiple Vitamins-Minerals (CENTRUM SILVER PO), Take 1 tablet by mouth at bedtime. , Disp: , Rfl:  .  nitroGLYCERIN (NITROSTAT) 0.4 MG SL tablet, DISSOLVE ONE TABLET UNDER THE TONGUE EVERY 5 MINUTES AS NEEDED FOR CHEST PAIN.  DO NOT EXCEED A TOTAL OF 3 DOSES IN 15 MINUTES, Disp: 25 tablet, Rfl: 0 .  pantoprazole (PROTONIX) 40 MG tablet, Take 40 mg by mouth daily. , Disp: , Rfl:  .  potassium chloride (K-DUR,KLOR-CON) 10 MEQ tablet, Take 1 tablet (10 mEq total) by mouth daily., Disp: 30  tablet, Rfl: 0 .  rosuvastatin (CRESTOR) 20 MG tablet, Take 1 tablet (20 mg total) by mouth daily., Disp: 90 tablet, Rfl: 3 .  sodium chloride (OCEAN) 0.65 % SOLN nasal spray, Place 1-2 sprays into both nostrils daily as needed for congestion. , Disp: , Rfl:  .  tamsulosin (FLOMAX) 0.4 MG CAPS capsule, Take 0.4 mg by mouth at bedtime. , Disp: , Rfl:    Allergies  Allergen Reactions  . Ramipril Other (See Comments)    cough  . Penicillins Itching and Rash    Has patient had a PCN reaction causing immediate rash, facial/tongue/throat swelling, SOB or lightheadedness with hypotension: No Has patient had a PCN reaction causing severe rash involving mucus membranes or skin necrosis: Yes-back and hands. Has patient had a PCN reaction that required hospitalization: No Has patient had a PCN reaction occurring within the last 10 years: Yes If all of the above answers are "NO", then may proceed with Cephalosporin use.     Objective:  Physical Examination: Neurovascular status intact   Left hallux with tenderness to palpation medial border with hyperkeratotic debris noted in border. No erythema, no edema, no drainage.   Assessment: Ingrown toenail left hallux  Plan:  Mr. Reier declines local anesthetic on today.  3-WEA  Soak applied to border.  Offending nailplate curretaged more and a little more nail was debrided from the area. TAO and bandaid applied to border. Wife instructed to apply antibiotic ointment to toe once daily.  Patient noted relief. He is to keep his previous scheduled routine appointment.

## 2018-11-23 ENCOUNTER — Encounter: Payer: Self-pay | Admitting: Podiatry

## 2018-11-23 ENCOUNTER — Ambulatory Visit (INDEPENDENT_AMBULATORY_CARE_PROVIDER_SITE_OTHER): Payer: Medicare Other | Admitting: Podiatry

## 2018-11-23 DIAGNOSIS — B351 Tinea unguium: Secondary | ICD-10-CM

## 2018-11-23 DIAGNOSIS — M79675 Pain in left toe(s): Secondary | ICD-10-CM | POA: Diagnosis not present

## 2018-11-23 DIAGNOSIS — M79674 Pain in right toe(s): Secondary | ICD-10-CM | POA: Diagnosis not present

## 2018-11-23 DIAGNOSIS — L600 Ingrowing nail: Secondary | ICD-10-CM

## 2018-11-23 NOTE — Progress Notes (Signed)
Subjective: [name] presents today accompanied by his wife. He presents  with painful, thick toenails 1-5 b/l that he cannot cut and which interfere with daily activities.  Pain is aggravated when wearing enclosed shoe gear.  [name] states his left great toe is starting to hurt again today.   [name], MD is his PCP.    Current Outpatient Medications:  .  acetaminophen (TYLENOL) 325 MG tablet, Take 2 tablets (650 mg total) by mouth every 6 (six) hours as needed for mild pain (or Fever >/= 101)., Disp: , Rfl:  .  carvedilol (COREG) 6.25 MG tablet, TAKE 1 TABLET (6.25MG ) BY MOUTH TWICE DAILY WITH A MEAL, Disp: 180 tablet, Rfl: 1 .  ELIQUIS 2.5 MG TABS tablet, TAKE 1 TABLET TWICE A DAY, Disp: 60 tablet, Rfl: 5 .  ezetimibe (ZETIA) 10 MG tablet, TAKE ONE TABLET BY MOUTH ONCE DAILY WITH SUPPER, Disp: 90 tablet, Rfl: 3 .  ferrous sulfate ([employer]S IRON) 325 (65 FE) MG tablet, Take 325 mg by mouth daily with supper. , Disp: , Rfl:  .  fluticasone (FLONASE) 50 MCG/ACT nasal spray, , Disp: , Rfl:  .  furosemide (LASIX) 20 MG tablet, Take 2 tablets (40 mg total) by mouth daily., Disp: 180 tablet, Rfl: 2 .  levothyroxine (SYNTHROID, LEVOTHROID) 50 MCG tablet, Take 25-50 mcg by mouth daily before breakfast. Take 50 mcg (1 tablet) on Tues, Thurs, Sat, Sun and 25 mcg (1/2 tablet) on Mon, Wed, Fri, Disp: , Rfl:  .  Magnesium Hydroxide (MILK OF MAGNESIA PO), Take 20 mLs by mouth daily as needed (for constipation)., Disp: , Rfl:  .  Multiple Vitamins-Minerals (CENTRUM SILVER PO), Take 1 tablet by mouth at bedtime. , Disp: , Rfl:  .  nitroGLYCERIN (NITROSTAT) 0.4 MG SL tablet, DISSOLVE ONE TABLET UNDER THE TONGUE EVERY 5 MINUTES AS NEEDED FOR CHEST PAIN.  DO NOT EXCEED A TOTAL OF 3 DOSES IN 15 MINUTES, Disp: 25 tablet, Rfl: 0 .  pantoprazole (PROTONIX) 40 MG tablet, Take 40 mg by mouth daily. , Disp: , Rfl:  .  potassium chloride (K-DUR,KLOR-CON) 10 MEQ tablet, Take 1 tablet (10 mEq total) by  mouth daily., Disp: 30 tablet, Rfl: 0 .  rosuvastatin (CRESTOR) 20 MG tablet, Take 1 tablet (20 mg total) by mouth daily., Disp: 90 tablet, Rfl: 3 .  sodium chloride (OCEAN) 0.65 % SOLN nasal spray, Place 1-2 sprays into both nostrils daily as needed for congestion. , Disp: , Rfl:  .  tamsulosin (FLOMAX) 0.4 MG CAPS capsule, Take 0.4 mg by mouth at bedtime. , Disp: , Rfl:   Allergies  Allergen Reactions  . Ramipril Other (See Comments)    cough  . Penicillins Itching and Rash    Has patient had a PCN reaction causing immediate rash, facial/tongue/throat swelling, SOB or lightheadedness with hypotension: No Has patient had a PCN reaction causing severe rash involving mucus membranes or skin necrosis: Yes-back and hands. Has patient had a PCN reaction that required hospitalization: No Has patient had a PCN reaction occurring within the last 10 years: Yes If all of the above answers are "NO", then may proceed with Cephalosporin use.    Objective:  Vascular Examination: Capillary refill time <3 seconds  x 10 digits  Dorsalis pedis and Posterior tibial pulses palpable b/l  Digital hair absent x 10 digits  Skin temperature gradient WNL b/l  Dermatological Examination: Skin with normal turgor, texture and tone b/l  Toenails 1-5 b/l discolored, thick, dystrophic with subungual  debris and pain with palpation to nailbeds due to thickness of nails.  Left hallux with incurvated nailplate with tenderness to palpation at distal medial corner. No erythema, no edema, no drainage, no flocculence noted.  Spine calluses noted submet head 1 b/l. No erythema, no edema, no drainage, no flocculence noted.  Musculoskeletal: Muscle strength 5/5 to all LE muscle groups  Hammertoe deformity 2-5 b/l  Neurological: Sensation intact with 10 gram monofilament.  Vibratory sensation intact.  Assessment: Painful onychomycosis toenails 1-5 b/l in patient on blood thinner Callus submet head 1  b/l  Plan: 1. Toenails 1-5 b/l were debrided in length and girth. Light bleeding right hallux addressed with Lumicain Hemostatic Solution. Digit cleansed with alcohol.  2. Offending nail border left hallux debrided and curretaged. Border cleansed with alcohol. Triple antibiotic ointment applied.  We discussed possible need for removal of offending nail border under local anesthetic, but he is hesitant because he does not like needles. 3. Triple antibiotic ointment applied to both great toes. Wife to apply triple antibiotic ointment to great toes once daily for one week. 4. Patient to continue soft, supportive shoe gear 5. Patient to report any pedal injuries to medical professional immediately. 6. Follow up 9 weeks.  7. Patient/POA to call should there be a concern in the interim.

## 2018-12-07 ENCOUNTER — Other Ambulatory Visit (HOSPITAL_COMMUNITY): Payer: Self-pay | Admitting: Internal Medicine

## 2018-12-08 ENCOUNTER — Encounter (HOSPITAL_COMMUNITY): Payer: Self-pay | Admitting: Internal Medicine

## 2018-12-08 ENCOUNTER — Ambulatory Visit (HOSPITAL_COMMUNITY)
Admission: RE | Admit: 2018-12-08 | Discharge: 2018-12-08 | Disposition: A | Discharge status: Home / Self Care | Payer: Medicare Other | Source: Ambulatory Visit | Attending: Internal Medicine | Admitting: Internal Medicine

## 2018-12-08 ENCOUNTER — Other Ambulatory Visit: Payer: Self-pay

## 2018-12-08 VITALS — BP 106/62 | HR 74 | Wt 184.2 lb

## 2018-12-08 DIAGNOSIS — I5022 Chronic systolic (congestive) heart failure: Secondary | ICD-10-CM | POA: Insufficient documentation

## 2018-12-08 DIAGNOSIS — N184 Chronic kidney disease, stage 4 (severe): Secondary | ICD-10-CM | POA: Diagnosis not present

## 2018-12-08 DIAGNOSIS — Z7901 Long term (current) use of anticoagulants: Secondary | ICD-10-CM | POA: Diagnosis not present

## 2018-12-08 DIAGNOSIS — Z955 Presence of coronary angioplasty implant and graft: Secondary | ICD-10-CM | POA: Insufficient documentation

## 2018-12-08 DIAGNOSIS — I252 Old myocardial infarction: Secondary | ICD-10-CM | POA: Diagnosis not present

## 2018-12-08 DIAGNOSIS — G250 Essential tremor: Secondary | ICD-10-CM | POA: Insufficient documentation

## 2018-12-08 DIAGNOSIS — I480 Paroxysmal atrial fibrillation: Secondary | ICD-10-CM | POA: Insufficient documentation

## 2018-12-08 DIAGNOSIS — E039 Hypothyroidism, unspecified: Secondary | ICD-10-CM | POA: Insufficient documentation

## 2018-12-08 DIAGNOSIS — G4730 Sleep apnea, unspecified: Secondary | ICD-10-CM | POA: Insufficient documentation

## 2018-12-08 DIAGNOSIS — Z79899 Other long term (current) drug therapy: Secondary | ICD-10-CM | POA: Insufficient documentation

## 2018-12-08 DIAGNOSIS — K219 Gastro-esophageal reflux disease without esophagitis: Secondary | ICD-10-CM | POA: Diagnosis not present

## 2018-12-08 DIAGNOSIS — K2270 Barrett's esophagus without dysplasia: Secondary | ICD-10-CM | POA: Insufficient documentation

## 2018-12-08 DIAGNOSIS — E785 Hyperlipidemia, unspecified: Secondary | ICD-10-CM | POA: Diagnosis not present

## 2018-12-08 DIAGNOSIS — I130 Hypertensive heart and chronic kidney disease with heart failure and stage 1 through stage 4 chronic kidney disease, or unspecified chronic kidney disease: Secondary | ICD-10-CM | POA: Insufficient documentation

## 2018-12-08 DIAGNOSIS — I2510 Atherosclerotic heart disease of native coronary artery without angina pectoris: Secondary | ICD-10-CM | POA: Insufficient documentation

## 2018-12-08 LAB — CBC
HCT: 42.8 % (ref 39.0–52.0)
HEMOGLOBIN: 13.1 g/dL (ref 13.0–17.0)
MCH: 29.6 pg (ref 26.0–34.0)
MCHC: 30.6 g/dL (ref 30.0–36.0)
MCV: 96.8 fL (ref 80.0–100.0)
Platelets: 203 10*3/uL (ref 150–400)
RBC: 4.42 MIL/uL (ref 4.22–5.81)
RDW: 12 % (ref 11.5–15.5)
WBC: 6.4 10*3/uL (ref 4.0–10.5)
nRBC: 0 % (ref 0.0–0.2)

## 2018-12-08 LAB — BASIC METABOLIC PANEL
ANION GAP: 7 (ref 5–15)
BUN: 31 mg/dL — ABNORMAL HIGH (ref 8–23)
CHLORIDE: 108 mmol/L (ref 98–111)
CO2: 26 mmol/L (ref 22–32)
Calcium: 8.9 mg/dL (ref 8.9–10.3)
Creatinine, Ser: 2.49 mg/dL — ABNORMAL HIGH (ref 0.61–1.24)
GFR calc Af Amer: 26 mL/min — ABNORMAL LOW (ref >60)
GFR, EST NON AFRICAN AMERICAN: 23 mL/min — AB (ref >60)
GLUCOSE: 99 mg/dL (ref 70–99)
POTASSIUM: 3.6 mmol/L (ref 3.5–5.1)
Sodium: 141 mmol/L (ref 135–145)

## 2018-12-08 NOTE — Patient Instructions (Signed)
No medication changes today!!  Labs today We will only contact you if something comes back abnormal or we need to make some changes. Otherwise no news is good news!  Your physician recommends that you schedule a follow-up appointment in: 6 months. You will be called to schedule this appointment.

## 2018-12-08 NOTE — Addendum Note (Signed)
Encounter addended by: Valeda Malm, RN on: 12/08/2018 12:19 PM  Actions taken: Order list changed, Diagnosis association updated, Charge Capture section accepted, Clinical Note Signed

## 2018-12-08 NOTE — Progress Notes (Signed)
Patient ID: [name], male   DOB: 1931/12/11, 85 y.o.   MRN: [id number]    Advanced Heart Failure Clinic Note   PCP: Virgina Jock  HPI: [name] is a 85 y.o. male with a history of coronary artery disease, status post previous large anterior wall myocardial infarction s/p multivessel stenting, systolic CHF with EF 05% s/p St. Jude BiV ICD.  Remainder of his medical history is notable for atrial fibrillation,maintaining sinus rhythm on amiodarone.  He is not on Coumadin secondary to GI bleed, chronic renal insufficiency with baseline creatinine about 2.2-2.5, hypertension, hyperlipidemia, and a systemic tremor.Bladder cancer 05/2012. Completed BCG treatment.   2015 found that his left ventricular lead has not been capturing suggestive of either fracture of the proximal electrode or an issue at the header. They reprogrammed the device to use to the RV coil which he tolerated well and had a good threshold.   He was admitted in 11/17 for respiratory virus.  Admitted 12/30 - 10/15/16 with A/C CHF in setting of URI. Thought to have mild CHF, so given IV diuresis in house with rapid improvement.   Admitted 2/19 with enterococcus bacteremia thought to be UTI. Treated with daptomycin. TEE EF 20-25% no vegetation mod MR  He presents today for regular follow up. Feeling pretty good. Does ADLs without too much problem. No CP. Mild DOE. No edema, orthopnea or PND. No bleeding with Eliquis. No light headedness.   ICD:  Personally reviewed. Volume ok. No VT/AF   Echo 12/14 LVEF 20-25% Carotid u/s 3/14: 40-59% R 0-39%%L Echo 11/2017: EF 25%   Review of systems complete and found to be negative unless listed in HPI.    Past Medical History:  Diagnosis Date  . Acute bronchitis with COPD ([place]) 09/10/2016  . Anemia   . Atrial fibrillation or flutter    maintaining sinus on amiodarone    . Bladder cancer ([place]) dx'd 05/2012  . BPH (benign prostatic hyperplasia)   . CAD (coronary artery disease)      a. s/p anterior MI 12/05 c/b shock -> stent LAD   b. s/p stenting OM-1, 2/06  . CHF (congestive heart failure) ([facility])    due to ischemic CM  a. EF 20-30%. (Nov 2008)   b. s/p St. Jude BiV-ICD    c. CPX 07/2008  pvo2 16.3 (63% predicted) slope 34 RER 1.08 O2 pulse 93%  . Cough    occasional /productive, no fever/ not new  . CRI (chronic renal insufficiency)    (baseline 2.0-2.2)/ recent hospitalization 8/13  . GERD (gastroesophageal reflux disease)    hx Barretts esophagitis  . History of blood transfusion    following coumadin  usage  . HTN (hypertension)    EKG 05/14/12,Chest x ray 8/13, last ICD interrogation 8/13 EPIC  . Hyperlipidemia   . Hypothyroidism   . 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 Skin cancer    basal cells   facial x 4, 1 right forearm  . Sleep apnea    STOP BANG SCORE 4    Current Outpatient Medications  Medication Sig Dispense Refill  . acetaminophen (TYLENOL) 325 MG tablet Take 2 tablets (650 mg total) by mouth every 6 (six) hours as needed for mild pain (or Fever >/= 101).    . carvedilol (COREG) 6.25 MG tablet TAKE 1 TABLET (6.25MG ) BY MOUTH TWICE DAILY  WITH A MEAL 180 tablet 1  . ELIQUIS 2.5 MG TABS tablet TAKE 1 TABLET TWICE A DAY 60 tablet 5  . ezetimibe (ZETIA) 10 MG tablet TAKE ONE TABLET BY MOUTH ONCE DAILY WITH SUPPER 90 tablet 3  . ferrous sulfate ([employer]S IRON) 325 (65 FE) MG tablet Take 325 mg by mouth daily with supper.     . fluticasone (FLONASE) 50 MCG/ACT nasal spray     . furosemide (LASIX) 20 MG tablet Take 2 tablets (40 mg total) by mouth daily. 180 tablet 2  . levothyroxine (SYNTHROID, LEVOTHROID) 50 MCG tablet Take 25-50 mcg by mouth daily before breakfast. Take 50 mcg (1 tablet) on Tues, Thurs, Sat, Sun and 25 mcg (1/2 tablet) on Mon, Wed, Fri    . Magnesium Hydroxide (MILK OF MAGNESIA PO) Take 20 mLs by mouth daily as  needed (for constipation).    . Multiple Vitamins-Minerals (CENTRUM SILVER PO) Take 1 tablet by mouth at bedtime.     . nitroGLYCERIN (NITROSTAT) 0.4 MG SL tablet DISSOLVE ONE TABLET UNDER THE TONGUE EVERY 5 MINUTES AS NEEDED FOR CHEST PAIN.  DO NOT EXCEED A TOTAL OF 3 DOSES IN 15 MINUTES 25 tablet 0  . pantoprazole (PROTONIX) 40 MG tablet Take 40 mg by mouth daily.     . potassium chloride (K-DUR,KLOR-CON) 10 MEQ tablet Take 1 tablet (10 mEq total) by mouth daily. 30 tablet 0  . rosuvastatin (CRESTOR) 20 MG tablet Take 1 tablet (20 mg total) by mouth daily. 90 tablet 3  . sodium chloride (OCEAN) 0.65 % SOLN nasal spray Place 1-2 sprays into both nostrils daily as needed for congestion.     . tamsulosin (FLOMAX) 0.4 MG CAPS capsule Take 0.4 mg by mouth at bedtime.      No current facility-administered medications for this encounter.    PHYSICAL EXAM: Vitals:   12/08/18 1127  BP: 106/62  Pulse: 74  SpO2: 97%  Weight: 83.6 kg (184 lb 4 oz)     Wt Readings from Last 3 Encounters:  12/08/18 83.6 kg (184 lb 4 oz)  09/01/18 83 kg (183 lb)  08/05/18 83.4 kg (183 lb 12.8 oz)    General:  Elderly. No resp difficulty HEENT: normal Neck: supple. no JVD. Carotids 2+ bilat; no bruits. No lymphadenopathy or thryomegaly appreciated. Cor: PMI nondisplaced. Regular rate & rhythm. Soft MR murmur Lungs: clear Abdomen: soft, nontender, nondistended. No hepatosplenomegaly. No bruits or masses. Good bowel sounds. Extremities: no cyanosis, clubbing, rash, edema Neuro: alert & orientedx3, cranial nerves grossly intact. moves all 4 extremities w/o difficulty. Affect pleasant  ASSESSMENT & PLAN: 1. Chronic systolic CHF. Echo 10/13/2017 LVEF 20-25% (Stable from last year). Echo 2/19 EF 20-25% - Chronic NYHA II-III symptoms.  - Volume status stable on exam and Corevue.  ICD interrogated personally. Report as above.  - Continue lasix 40 mg daily.  - BP too low to add valsartan back especially with CKD  IV. - Continue coreg 6.25 mg BID.  2. CAD - No s/s of ischemia - Off ASA due to Eliquis.  - Continue statin. Lipids followed by Dr. Virgina Jock.  3. PAF  - Remains in NSR - Now off amiodarone. Tremors have not gotten better.  - Denies bleeding. Continue Eliquis 2.5 mg BID.  4. Carotid ultrasound  - 1-39% bilaterally on last scan 3/15. No repeat warranted.  5. CKD, Stage IV  - Creatinine 2.72 in 6/19. Saw Dr. Joelyn Oms recently and it was stable .  6. Essential Tremors  -  Followed by PCP. Had a neurologic work up and negative for [employer].   - Tremors likely related to his amio.    Glori Bickers, MD  11:52 AM

## 2019-01-11 DIAGNOSIS — I700 Atherosclerosis of aorta: Secondary | ICD-10-CM | POA: Diagnosis not present

## 2019-01-11 DIAGNOSIS — I5022 Chronic systolic (congestive) heart failure: Secondary | ICD-10-CM | POA: Diagnosis not present

## 2019-01-11 DIAGNOSIS — L600 Ingrowing nail: Secondary | ICD-10-CM | POA: Insufficient documentation

## 2019-01-11 DIAGNOSIS — C4491 Basal cell carcinoma of skin, unspecified: Secondary | ICD-10-CM | POA: Diagnosis not present

## 2019-01-11 DIAGNOSIS — N184 Chronic kidney disease, stage 4 (severe): Secondary | ICD-10-CM | POA: Diagnosis not present

## 2019-01-11 DIAGNOSIS — Z950 Presence of cardiac pacemaker: Secondary | ICD-10-CM | POA: Diagnosis not present

## 2019-01-11 DIAGNOSIS — F325 Major depressive disorder, single episode, in full remission: Secondary | ICD-10-CM | POA: Diagnosis not present

## 2019-01-11 DIAGNOSIS — Z95810 Presence of automatic (implantable) cardiac defibrillator: Secondary | ICD-10-CM | POA: Diagnosis not present

## 2019-01-11 DIAGNOSIS — C679 Malignant neoplasm of bladder, unspecified: Secondary | ICD-10-CM | POA: Diagnosis not present

## 2019-01-11 DIAGNOSIS — I472 Ventricular tachycardia: Secondary | ICD-10-CM | POA: Diagnosis not present

## 2019-01-11 DIAGNOSIS — I480 Paroxysmal atrial fibrillation: Secondary | ICD-10-CM | POA: Diagnosis not present

## 2019-01-11 DIAGNOSIS — J449 Chronic obstructive pulmonary disease, unspecified: Secondary | ICD-10-CM | POA: Diagnosis not present

## 2019-01-11 DIAGNOSIS — D696 Thrombocytopenia, unspecified: Secondary | ICD-10-CM | POA: Diagnosis not present

## 2019-01-20 ENCOUNTER — Other Ambulatory Visit: Payer: Self-pay

## 2019-01-20 ENCOUNTER — Ambulatory Visit (INDEPENDENT_AMBULATORY_CARE_PROVIDER_SITE_OTHER): Payer: Medicare Other | Admitting: *Deleted

## 2019-01-20 ENCOUNTER — Telehealth: Payer: Self-pay

## 2019-01-20 DIAGNOSIS — I5022 Chronic systolic (congestive) heart failure: Secondary | ICD-10-CM

## 2019-01-20 DIAGNOSIS — I255 Ischemic cardiomyopathy: Secondary | ICD-10-CM

## 2019-01-20 LAB — CUP PACEART REMOTE DEVICE CHECK
Battery Remaining Longevity: 19 mo
Battery Remaining Percentage: 27 %
Battery Voltage: 2.87 V
Brady Statistic AP VP Percent: 96 %
Brady Statistic AP VS Percent: 1 %
Brady Statistic AS VP Percent: 3.9 %
Brady Statistic AS VS Percent: 1 %
Brady Statistic RA Percent Paced: 96 %
Date Time Interrogation Session: 20200408071140
HighPow Impedance: 45 Ohm
HighPow Impedance: 45 Ohm
Implantable Lead Implant Date: 20060509
Implantable Lead Implant Date: 20060509
Implantable Lead Implant Date: 20090417
Implantable Lead Location: 753858
Implantable Lead Location: 753859
Implantable Lead Location: 753860
Implantable Lead Model: 5076
Implantable Lead Model: 7001
Implantable Pulse Generator Implant Date: 20141208
Lead Channel Impedance Value: 380 Ohm
Lead Channel Impedance Value: 440 Ohm
Lead Channel Impedance Value: 460 Ohm
Lead Channel Pacing Threshold Amplitude: 0.75 V
Lead Channel Pacing Threshold Amplitude: 1 V
Lead Channel Pacing Threshold Amplitude: 1.125 V
Lead Channel Pacing Threshold Pulse Width: 0.5 ms
Lead Channel Pacing Threshold Pulse Width: 0.8 ms
Lead Channel Pacing Threshold Pulse Width: 0.8 ms
Lead Channel Sensing Intrinsic Amplitude: 12 mV
Lead Channel Sensing Intrinsic Amplitude: 3.5 mV
Lead Channel Setting Pacing Amplitude: 2 V
Lead Channel Setting Pacing Amplitude: 2 V
Lead Channel Setting Pacing Amplitude: 2.125
Lead Channel Setting Pacing Pulse Width: 0.8 ms
Lead Channel Setting Pacing Pulse Width: 0.8 ms
Lead Channel Setting Sensing Sensitivity: 0.5 mV
Pulse Gen Serial Number: 7138995

## 2019-01-20 NOTE — Telephone Encounter (Signed)
Spoke with patient to remind of missed remote transmission 

## 2019-01-25 ENCOUNTER — Ambulatory Visit: Payer: Medicare Other | Admitting: Podiatry

## 2019-01-29 NOTE — Progress Notes (Signed)
Remote ICD transmission.   

## 2019-03-15 DIAGNOSIS — H1132 Conjunctival hemorrhage, left eye: Secondary | ICD-10-CM | POA: Diagnosis not present

## 2019-03-23 DIAGNOSIS — N184 Chronic kidney disease, stage 4 (severe): Secondary | ICD-10-CM | POA: Diagnosis not present

## 2019-03-25 DIAGNOSIS — L821 Other seborrheic keratosis: Secondary | ICD-10-CM | POA: Diagnosis not present

## 2019-03-25 DIAGNOSIS — H6993 Unspecified Eustachian tube disorder, bilateral: Secondary | ICD-10-CM | POA: Diagnosis not present

## 2019-03-25 DIAGNOSIS — J310 Chronic rhinitis: Secondary | ICD-10-CM | POA: Diagnosis not present

## 2019-03-25 DIAGNOSIS — D485 Neoplasm of uncertain behavior of skin: Secondary | ICD-10-CM | POA: Diagnosis not present

## 2019-03-25 DIAGNOSIS — D0462 Carcinoma in situ of skin of left upper limb, including shoulder: Secondary | ICD-10-CM | POA: Diagnosis not present

## 2019-03-25 DIAGNOSIS — D692 Other nonthrombocytopenic purpura: Secondary | ICD-10-CM | POA: Diagnosis not present

## 2019-03-25 DIAGNOSIS — L570 Actinic keratosis: Secondary | ICD-10-CM | POA: Diagnosis not present

## 2019-03-25 DIAGNOSIS — Z85828 Personal history of other malignant neoplasm of skin: Secondary | ICD-10-CM | POA: Diagnosis not present

## 2019-03-26 DIAGNOSIS — I129 Hypertensive chronic kidney disease with stage 1 through stage 4 chronic kidney disease, or unspecified chronic kidney disease: Secondary | ICD-10-CM | POA: Diagnosis not present

## 2019-03-26 DIAGNOSIS — D472 Monoclonal gammopathy: Secondary | ICD-10-CM | POA: Diagnosis not present

## 2019-03-26 DIAGNOSIS — I509 Heart failure, unspecified: Secondary | ICD-10-CM | POA: Diagnosis not present

## 2019-03-26 DIAGNOSIS — N184 Chronic kidney disease, stage 4 (severe): Secondary | ICD-10-CM | POA: Diagnosis not present

## 2019-04-06 ENCOUNTER — Other Ambulatory Visit: Payer: Self-pay

## 2019-04-06 ENCOUNTER — Ambulatory Visit (INDEPENDENT_AMBULATORY_CARE_PROVIDER_SITE_OTHER): Payer: Medicare Other | Admitting: Podiatry

## 2019-04-06 ENCOUNTER — Encounter: Payer: Self-pay | Admitting: Podiatry

## 2019-04-06 VITALS — Temp 97.9°F

## 2019-04-06 DIAGNOSIS — B351 Tinea unguium: Secondary | ICD-10-CM

## 2019-04-06 DIAGNOSIS — L600 Ingrowing nail: Secondary | ICD-10-CM

## 2019-04-06 DIAGNOSIS — M79674 Pain in right toe(s): Secondary | ICD-10-CM | POA: Diagnosis not present

## 2019-04-06 DIAGNOSIS — Z9229 Personal history of other drug therapy: Secondary | ICD-10-CM

## 2019-04-06 DIAGNOSIS — M79675 Pain in left toe(s): Secondary | ICD-10-CM

## 2019-04-06 NOTE — Patient Instructions (Signed)

## 2019-04-06 NOTE — Progress Notes (Signed)
Subjective:  [name] presents to clinic today with cc of  painful, thick, discolored, elongated toenails 1-5 b/l that become tender and cannot cut because of thickness. His wife is present during the visit. Wife states he is having pain of his left great toe inside border. He cut the top out of one of his shoes to relieve the pressure. He states the toe felt better after this. He denies any drainage, redness or swelling of digit.    [name], MD is his PCP.    Current Outpatient Medications:  .  acetaminophen (TYLENOL) 325 MG tablet, Take 2 tablets (650 mg total) by mouth every 6 (six) hours as needed for mild pain (or Fever >/= 101)., Disp: , Rfl:  .  carvedilol (COREG) 6.25 MG tablet, TAKE 1 TABLET (6.25MG ) BY MOUTH TWICE DAILY WITH A MEAL, Disp: 180 tablet, Rfl: 1 .  ELIQUIS 2.5 MG TABS tablet, TAKE 1 TABLET TWICE A DAY, Disp: 60 tablet, Rfl: 5 .  ezetimibe (ZETIA) 10 MG tablet, TAKE ONE TABLET BY MOUTH ONCE DAILY WITH SUPPER, Disp: 90 tablet, Rfl: 3 .  ferrous sulfate (CVS IRON) 325 (65 FE) MG tablet, Take 325 mg by mouth daily with supper. , Disp: , Rfl:  .  fluticasone (FLONASE) 50 MCG/ACT nasal spray, , Disp: , Rfl:  .  furosemide (LASIX) 20 MG tablet, Take 2 tablets (40 mg total) by mouth daily., Disp: 180 tablet, Rfl: 2 .  levothyroxine (SYNTHROID, LEVOTHROID) 50 MCG tablet, Take 25-50 mcg by mouth daily before breakfast. Take 50 mcg (1 tablet) on Tues, Thurs, Sat, Sun and 25 mcg (1/2 tablet) on Mon, Wed, Fri, Disp: , Rfl:  .  Magnesium Hydroxide (MILK OF MAGNESIA PO), Take 20 mLs by mouth daily as needed (for constipation)., Disp: , Rfl:  .  Multiple Vitamins-Minerals (CENTRUM SILVER PO), Take 1 tablet by mouth at bedtime. , Disp: , Rfl:  .  nitroGLYCERIN (NITROSTAT) 0.4 MG SL tablet, DISSOLVE ONE TABLET UNDER THE TONGUE EVERY 5 MINUTES AS NEEDED FOR CHEST PAIN.  DO NOT EXCEED A TOTAL OF 3 DOSES IN 15 MINUTES, Disp: 25 tablet, Rfl: 0 .  pantoprazole (PROTONIX) 40 MG  tablet, Take 40 mg by mouth daily. , Disp: , Rfl:  .  potassium chloride (K-DUR,KLOR-CON) 10 MEQ tablet, Take 1 tablet (10 mEq total) by mouth daily., Disp: 30 tablet, Rfl: 0 .  rosuvastatin (CRESTOR) 20 MG tablet, Take 1 tablet (20 mg total) by mouth daily., Disp: 90 tablet, Rfl: 3 .  sodium chloride (OCEAN) 0.65 % SOLN nasal spray, Place 1-2 sprays into both nostrils daily as needed for congestion. , Disp: , Rfl:  .  tamsulosin (FLOMAX) 0.4 MG CAPS capsule, Take 0.4 mg by mouth at bedtime. , Disp: , Rfl:    Allergies  Allergen Reactions  . Ramipril Other (See Comments)    cough  . Penicillins Itching and Rash    Has patient had a PCN reaction causing immediate rash, facial/tongue/throat swelling, SOB or lightheadedness with hypotension: No Has patient had a PCN reaction causing severe rash involving mucus membranes or skin necrosis: Yes-back and hands. Has patient had a PCN reaction that required hospitalization: No Has patient had a PCN reaction occurring within the last 10 years: Yes If all of the above answers are "NO", then may proceed with Cephalosporin use.     Objective: Vitals:   04/06/19 1326  Temp: 97.9 F (36.6 C)    Physical Examination:  Vascular Examination: Capillary refill time <3  seconds x 10 digits.  Palpable DP/PT pulses b/l.  Digital hair absent b/l.  No edema noted b/l.  Skin temperature gradient WNL b/l.  Dermatological Examination: Skin with normal turgor, texture and tone b/l.  No open wounds b/l.  No interdigital macerations noted b/l.  Elongated, thick, discolored brittle toenails with subungual debris and pain on dorsal palpation of nailbeds 1-5 b/l.  Incurvated nailplate left great toe medial border with tenderness to palpation. No erythema, no edema, no drainage noted.  Musculoskeletal Examination: Muscle strength 5/5 to all muscle groups b/l.  Hammertoes 2-5 b/l.  No pain, crepitus or joint discomfort with active/passive  ROM.  Neurological Examination: Sensation intact 5/5 b/l with 10 gram monofilament.  Vibratory sensation intact b/l.  Proprioceptive sensation intact b/l.  Assessment: Mycotic nail infection with pain 1-5 b/l  Plan: 1. Toenails 1-5 b/l were debrided in length and girth without iatrogenic laceration. Offending nail borders debrided and curretaged medial border left great toe. Borders cleansed with alcohol.  No further treatment required by patient. 2.  Continue soft, supportive shoe gear daily. 3.  Report any pedal injuries to medical professional. 4.  Follow up 3 months. 5.  Patient/POA to call should there be a question/concern in there interim.

## 2019-04-21 ENCOUNTER — Ambulatory Visit (INDEPENDENT_AMBULATORY_CARE_PROVIDER_SITE_OTHER): Payer: Medicare Other | Admitting: *Deleted

## 2019-04-21 DIAGNOSIS — I255 Ischemic cardiomyopathy: Secondary | ICD-10-CM | POA: Diagnosis not present

## 2019-04-21 DIAGNOSIS — I5022 Chronic systolic (congestive) heart failure: Secondary | ICD-10-CM

## 2019-04-21 LAB — CUP PACEART REMOTE DEVICE CHECK
Battery Remaining Longevity: 19 mo
Battery Remaining Percentage: 28 %
Battery Voltage: 2.84 V
Brady Statistic AP VP Percent: 96 %
Brady Statistic AP VS Percent: 1 %
Brady Statistic AS VP Percent: 3.9 %
Brady Statistic AS VS Percent: 1 %
Brady Statistic RA Percent Paced: 95 %
Date Time Interrogation Session: 20200708075110
HighPow Impedance: 47 Ohm
HighPow Impedance: 48 Ohm
Implantable Lead Implant Date: 20060509
Implantable Lead Implant Date: 20060509
Implantable Lead Implant Date: 20090417
Implantable Lead Location: 753858
Implantable Lead Location: 753859
Implantable Lead Location: 753860
Implantable Lead Model: 5076
Implantable Lead Model: 7001
Implantable Pulse Generator Implant Date: 20141208
Lead Channel Impedance Value: 380 Ohm
Lead Channel Impedance Value: 440 Ohm
Lead Channel Impedance Value: 480 Ohm
Lead Channel Pacing Threshold Amplitude: 0.75 V
Lead Channel Pacing Threshold Amplitude: 1 V
Lead Channel Pacing Threshold Amplitude: 1.125 V
Lead Channel Pacing Threshold Pulse Width: 0.5 ms
Lead Channel Pacing Threshold Pulse Width: 0.8 ms
Lead Channel Pacing Threshold Pulse Width: 0.8 ms
Lead Channel Sensing Intrinsic Amplitude: 12 mV
Lead Channel Sensing Intrinsic Amplitude: 4.1 mV
Lead Channel Setting Pacing Amplitude: 2 V
Lead Channel Setting Pacing Amplitude: 2 V
Lead Channel Setting Pacing Amplitude: 2.125
Lead Channel Setting Pacing Pulse Width: 0.8 ms
Lead Channel Setting Pacing Pulse Width: 0.8 ms
Lead Channel Setting Sensing Sensitivity: 0.5 mV
Pulse Gen Serial Number: 7138995

## 2019-04-23 ENCOUNTER — Other Ambulatory Visit (HOSPITAL_COMMUNITY): Payer: Self-pay

## 2019-04-23 MED ORDER — APIXABAN 2.5 MG PO TABS: 2.5000 mg | ORAL_TABLET | Freq: Two times a day (BID) | ORAL | 5 refills | Status: DC

## 2019-05-03 ENCOUNTER — Encounter: Payer: Self-pay | Admitting: Cardiology

## 2019-05-03 NOTE — Progress Notes (Signed)
Remote ICD transmission.   

## 2019-05-07 DIAGNOSIS — Z1331 Encounter for screening for depression: Secondary | ICD-10-CM | POA: Diagnosis not present

## 2019-05-07 DIAGNOSIS — Z95810 Presence of automatic (implantable) cardiac defibrillator: Secondary | ICD-10-CM | POA: Diagnosis not present

## 2019-05-07 DIAGNOSIS — L600 Ingrowing nail: Secondary | ICD-10-CM | POA: Diagnosis not present

## 2019-05-07 DIAGNOSIS — J449 Chronic obstructive pulmonary disease, unspecified: Secondary | ICD-10-CM | POA: Diagnosis not present

## 2019-05-07 DIAGNOSIS — F325 Major depressive disorder, single episode, in full remission: Secondary | ICD-10-CM | POA: Diagnosis not present

## 2019-05-07 DIAGNOSIS — I472 Ventricular tachycardia: Secondary | ICD-10-CM | POA: Diagnosis not present

## 2019-05-07 DIAGNOSIS — I5022 Chronic systolic (congestive) heart failure: Secondary | ICD-10-CM | POA: Diagnosis not present

## 2019-05-07 DIAGNOSIS — C679 Malignant neoplasm of bladder, unspecified: Secondary | ICD-10-CM | POA: Diagnosis not present

## 2019-05-07 DIAGNOSIS — I130 Hypertensive heart and chronic kidney disease with heart failure and stage 1 through stage 4 chronic kidney disease, or unspecified chronic kidney disease: Secondary | ICD-10-CM | POA: Diagnosis not present

## 2019-05-07 DIAGNOSIS — N184 Chronic kidney disease, stage 4 (severe): Secondary | ICD-10-CM | POA: Diagnosis not present

## 2019-06-01 ENCOUNTER — Other Ambulatory Visit (HOSPITAL_COMMUNITY): Payer: Self-pay | Admitting: Internal Medicine

## 2019-06-03 ENCOUNTER — Ambulatory Visit (HOSPITAL_COMMUNITY)
Admission: RE | Admit: 2019-06-03 | Discharge: 2019-06-03 | Disposition: A | Discharge status: Home / Self Care | Payer: Medicare Other | Source: Ambulatory Visit | Attending: Internal Medicine | Admitting: Internal Medicine

## 2019-06-03 ENCOUNTER — Encounter (HOSPITAL_COMMUNITY): Payer: Self-pay | Admitting: Internal Medicine

## 2019-06-03 ENCOUNTER — Other Ambulatory Visit: Payer: Self-pay

## 2019-06-03 VITALS — BP 128/65 | HR 84 | Wt 186.2 lb

## 2019-06-03 DIAGNOSIS — G250 Essential tremor: Secondary | ICD-10-CM | POA: Insufficient documentation

## 2019-06-03 DIAGNOSIS — Z85828 Personal history of other malignant neoplasm of skin: Secondary | ICD-10-CM | POA: Diagnosis not present

## 2019-06-03 DIAGNOSIS — I5022 Chronic systolic (congestive) heart failure: Secondary | ICD-10-CM | POA: Diagnosis not present

## 2019-06-03 DIAGNOSIS — N400 Enlarged prostate without lower urinary tract symptoms: Secondary | ICD-10-CM | POA: Diagnosis not present

## 2019-06-03 DIAGNOSIS — E785 Hyperlipidemia, unspecified: Secondary | ICD-10-CM | POA: Diagnosis not present

## 2019-06-03 DIAGNOSIS — I130 Hypertensive heart and chronic kidney disease with heart failure and stage 1 through stage 4 chronic kidney disease, or unspecified chronic kidney disease: Secondary | ICD-10-CM | POA: Insufficient documentation

## 2019-06-03 DIAGNOSIS — G4730 Sleep apnea, unspecified: Secondary | ICD-10-CM | POA: Diagnosis not present

## 2019-06-03 DIAGNOSIS — Z7951 Long term (current) use of inhaled steroids: Secondary | ICD-10-CM | POA: Insufficient documentation

## 2019-06-03 DIAGNOSIS — Z7901 Long term (current) use of anticoagulants: Secondary | ICD-10-CM | POA: Diagnosis not present

## 2019-06-03 DIAGNOSIS — I480 Paroxysmal atrial fibrillation: Secondary | ICD-10-CM | POA: Diagnosis not present

## 2019-06-03 DIAGNOSIS — Z955 Presence of coronary angioplasty implant and graft: Secondary | ICD-10-CM | POA: Insufficient documentation

## 2019-06-03 DIAGNOSIS — E039 Hypothyroidism, unspecified: Secondary | ICD-10-CM | POA: Diagnosis not present

## 2019-06-03 DIAGNOSIS — Z79890 Hormone replacement therapy (postmenopausal): Secondary | ICD-10-CM | POA: Diagnosis not present

## 2019-06-03 DIAGNOSIS — Z95810 Presence of automatic (implantable) cardiac defibrillator: Secondary | ICD-10-CM | POA: Diagnosis not present

## 2019-06-03 DIAGNOSIS — Z79899 Other long term (current) drug therapy: Secondary | ICD-10-CM | POA: Insufficient documentation

## 2019-06-03 DIAGNOSIS — N184 Chronic kidney disease, stage 4 (severe): Secondary | ICD-10-CM | POA: Diagnosis not present

## 2019-06-03 DIAGNOSIS — I2510 Atherosclerotic heart disease of native coronary artery without angina pectoris: Secondary | ICD-10-CM | POA: Insufficient documentation

## 2019-06-03 DIAGNOSIS — I252 Old myocardial infarction: Secondary | ICD-10-CM | POA: Diagnosis not present

## 2019-06-03 DIAGNOSIS — Z8551 Personal history of malignant neoplasm of bladder: Secondary | ICD-10-CM | POA: Diagnosis not present

## 2019-06-03 NOTE — Patient Instructions (Signed)
Your physician wants you to follow-up in: 6 MONTHS. You will receive a reminder letter in the mail two months in advance. If you don't receive a letter, please call our office to schedule the follow-up appointment.  At the [facility], you and your health needs are our priority. As part of our continuing mission to provide you with exceptional heart care, we have created designated Provider Care Teams. These Care Teams include your primary Cardiologist (physician) and Advanced Practice Providers (APPs- Physician Assistants and Nurse Practitioners) who all work together to provide you with the care you need, when you need it.   You may see any of the following providers on your designated Care Team at your next follow up: Marland Kitchen Dr Glori Bickers . Dr Loralie Champagne . Darrick Grinder, NP   Please be sure to bring in all your medications bottles to every appointment.

## 2019-06-03 NOTE — Progress Notes (Signed)
Patient ID: [name], male   DOB: [birth year], 85 y.o.   MRN: [id number]    Advanced Heart Failure Clinic Note   PCP: Virgina Jock  HPI: [name] is a 85 y.o. male with a history of coronary artery disease, status post previous large anterior wall myocardial infarction s/p multivessel stenting, systolic CHF with EF 30% s/p St. Jude BiV ICD.  Remainder of his medical history is notable for atrial fibrillation,maintaining sinus rhythm on amiodarone.  He is not on Coumadin secondary to GI bleed, chronic renal insufficiency with baseline creatinine about 2.2-2.5, hypertension, hyperlipidemia, and a systemic tremor.Bladder cancer 05/2012. Completed BCG treatment.   2015 found that his left ventricular lead has not been capturing suggestive of either fracture of the proximal electrode or an issue at the header. They reprogrammed the device to use to the RV coil which he tolerated well and had a good threshold.   He was admitted in 11/17 for respiratory virus.  Admitted 12/30 - 10/15/16 with A/C CHF in setting of URI. Thought to have mild CHF, so given IV diuresis in house with rapid improvement.   Admitted 2/19 with enterococcus bacteremia thought to be UTI. Treated with daptomycin. TEE EF 20-25% no vegetation mod MR  He presents today for regular follow up. Feeling pretty good. Still doing his yard chores and ADLs without too much problem. Denies CP, SOB, orthopnea or PND. No edema. Being careful with Covid. BP stable. On eliquis 2.5 bid. No bleeding.    ICD:  Personally reviewed. Volume ok. No VT/AF. 100 BIV pacing  Echo 12/14 LVEF 20-25% Carotid u/s 3/14: 40-59% R 0-39%%L Echo 11/2017: EF 25%   Review of systems complete and found to be negative unless listed in HPI.    Past Medical History:  Diagnosis Date  . Acute bronchitis with COPD ([place]) 09/10/2016  . Anemia   . Atrial fibrillation or flutter    maintaining sinus on amiodarone    . Bladder cancer ([place]) dx'd 05/2012  . BPH  (benign prostatic hyperplasia)   . CAD (coronary artery disease)     a. s/p anterior MI 12/05 c/b shock -> stent LAD   b. s/p stenting OM-1, 2/06  . CHF (congestive heart failure) ([facility])    due to ischemic CM  a. EF 20-30%. (Nov 2008)   b. s/p St. Jude BiV-ICD    c. CPX 07/2008  pvo2 16.3 (63% predicted) slope 34 RER 1.08 O2 pulse 93%  . Cough    occasional /productive, no fever/ not new  . CRI (chronic renal insufficiency)    (baseline 2.0-2.2)/ recent hospitalization 8/13  . GERD (gastroesophageal reflux disease)    hx Barretts esophagitis  . History of blood transfusion    following coumadin  usage  . HTN (hypertension)    EKG 05/14/12,Chest x ray 8/13, last ICD interrogation 8/13 EPIC  . Hyperlipidemia   . Hypothyroidism   . 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 Neuromuscular disorder ([place])    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"   no neurologist  . Skin cancer    basal cells   facial x 4, 1 right forearm  . Sleep apnea    STOP BANG SCORE 4    Current Outpatient Medications  Medication Sig Dispense Refill  . acetaminophen (TYLENOL) 325 MG tablet Take 2 tablets (650 mg total) by mouth every 6 (six) hours as needed for mild pain (or Fever >/= 101).    Marland Kitchen apixaban (  ELIQUIS) 2.5 MG TABS tablet Take 1 tablet (2.5 mg total) by mouth 2 (two) times daily. 60 tablet 5  . carvedilol (COREG) 6.25 MG tablet TAKE 1 TABLET (6.25MG ) BY MOUTH TWICE DAILY WITH A MEAL 180 tablet 1  . ezetimibe (ZETIA) 10 MG tablet TAKE ONE TABLET BY MOUTH ONCE DAILY WITH SUPPER 90 tablet 3  . ferrous sulfate ([employer] IRON) 325 (65 FE) MG tablet Take 325 mg by mouth daily with supper.     . fluticasone (FLONASE) 50 MCG/ACT nasal spray     . furosemide (LASIX) 20 MG tablet Take 2 tablets (40 mg total) by mouth daily. 180 tablet 2  . levothyroxine (SYNTHROID, LEVOTHROID) 50 MCG tablet Take 25-50 mcg by mouth daily before breakfast. Take 50 mcg (1 tablet) on Tues, Thurs, Sat, Sun and 25  mcg (1/2 tablet) on Mon, Wed, Fri    . Magnesium Hydroxide (MILK OF MAGNESIA PO) Take 20 mLs by mouth daily as needed (for constipation).    . Multiple Vitamins-Minerals (CENTRUM SILVER PO) Take 1 tablet by mouth at bedtime.     . nitroGLYCERIN (NITROSTAT) 0.4 MG SL tablet DISSOLVE ONE TABLET UNDER THE TONGUE EVERY 5 MINUTES AS NEEDED FOR CHEST PAIN.  DO NOT EXCEED A TOTAL OF 3 DOSES IN 15 MINUTES 25 tablet 0  . pantoprazole (PROTONIX) 40 MG tablet Take 40 mg by mouth daily.     . potassium chloride (K-DUR,KLOR-CON) 10 MEQ tablet Take 1 tablet (10 mEq total) by mouth daily. 30 tablet 0  . rosuvastatin (CRESTOR) 20 MG tablet Take 1 tablet (20 mg total) by mouth daily. 90 tablet 3  . sodium chloride (OCEAN) 0.65 % SOLN nasal spray Place 1-2 sprays into both nostrils daily as needed for congestion.     . tamsulosin (FLOMAX) 0.4 MG CAPS capsule Take 0.4 mg by mouth at bedtime.      No current facility-administered medications for this encounter.    PHYSICAL EXAM: Vitals:   06/03/19 1249  BP: 128/65  Pulse: 84  SpO2: 96%  Weight: 84.5 kg (186 lb 3.2 oz)     Wt Readings from Last 3 Encounters:  06/03/19 84.5 kg (186 lb 3.2 oz)  12/08/18 83.6 kg (184 lb 4 oz)  09/01/18 83 kg (183 lb)    General:  Well appearing. No resp difficulty HEENT: normal Neck: supple. no JVD. Carotids 2+ bilat; no bruits. No lymphadenopathy or thryomegaly appreciated. Cor: PMI nondisplaced. Regular rate & rhythm. No rubs, gallops or murmurs. Lungs: clear Abdomen: soft, nontender, nondistended. No hepatosplenomegaly. No bruits or masses. Good bowel sounds. Extremities: no cyanosis, clubbing, rash, edema Neuro: alert & orientedx3, cranial nerves grossly intact. moves all 4 extremities w/o difficulty. Affect pleasant   ASSESSMENT & PLAN: 1. Chronic systolic CHF. Echo 10/13/2017 LVEF 20-25% (Stable from last year). Echo 2/19 EF 20-25% - Chronic NYHA II-III symptoms.  - Volume status stable on exam and Corevue.  -  ICD interrogated personally - Continue lasix 40 mg daily.  - BP too low to add valsartan back especially with CKD IV. - Continue coreg 6.25 mg BID.  2. CAD - No s/s of ischemia - Off ASA due to Eliquis.  - Continue statin. Lipids followed by Dr. Virgina Jock.  3. PAF  - Remains in NSR - Now off amiodarone. Tremors have not gotten better.  - Denies bleeding. Continue Eliquis 2.5 mg BID.  4. Carotid ultrasound  - 1-39% bilaterally on last scan 3/15. No repeat warranted.  5. CKD, Stage IV  -  Creatinine stable at 2.4 Saw Dr. Joelyn Oms recently and it was stable .  6. Essential Tremors  - Followed by PCP. Had a neurologic work up and negative for [employer].   - Tremors likely related to his amio.    Glori Bickers, MD  1:14 PM

## 2019-06-03 NOTE — Addendum Note (Signed)
Encounter addended by: Jovita Kussmaul, RN on: 06/03/2019 1:34 PM  Actions taken: Clinical Note Signed

## 2019-06-09 ENCOUNTER — Ambulatory Visit (INDEPENDENT_AMBULATORY_CARE_PROVIDER_SITE_OTHER): Payer: Medicare Other | Admitting: Podiatry

## 2019-06-09 ENCOUNTER — Encounter: Payer: Self-pay | Admitting: Podiatry

## 2019-06-09 ENCOUNTER — Other Ambulatory Visit: Payer: Self-pay

## 2019-06-09 DIAGNOSIS — L600 Ingrowing nail: Secondary | ICD-10-CM

## 2019-06-09 DIAGNOSIS — M79674 Pain in right toe(s): Secondary | ICD-10-CM

## 2019-06-09 DIAGNOSIS — M79675 Pain in left toe(s): Secondary | ICD-10-CM

## 2019-06-09 DIAGNOSIS — B351 Tinea unguium: Secondary | ICD-10-CM

## 2019-06-09 DIAGNOSIS — Z9229 Personal history of other drug therapy: Secondary | ICD-10-CM | POA: Diagnosis not present

## 2019-06-09 NOTE — Patient Instructions (Signed)

## 2019-06-14 ENCOUNTER — Telehealth: Payer: Self-pay | Admitting: *Deleted

## 2019-06-14 NOTE — Telephone Encounter (Signed)
Pt's wife, Romie Minus states pt had his toenails trimmed on Wednesday and the toes are dark due to the heavy trimming and they wanted to know if they could soak.

## 2019-06-14 NOTE — Telephone Encounter (Signed)
I called pt's wife, Romie Minus and asked pt's status. Romie Minus states pt is doing much better.

## 2019-06-17 NOTE — Progress Notes (Signed)
Subjective:  [name] presents to clinic today with cc of  painful, thick, discolored, elongated toenails 1-5 b/l that become tender and cannot cut because of thickness. Pain is aggravated when wearing enclosed shoe gear. He has h/o chronic ingrown toenails which are relieved with periodic visits.   [name], MD is his PCP.    Current Outpatient Medications:  .  acetaminophen (TYLENOL) 325 MG tablet, Take 2 tablets (650 mg total) by mouth every 6 (six) hours as needed for mild pain (or Fever >/= 101)., Disp: , Rfl:  .  apixaban (ELIQUIS) 2.5 MG TABS tablet, Take 1 tablet (2.5 mg total) by mouth 2 (two) times daily., Disp: 60 tablet, Rfl: 5 .  carvedilol (COREG) 6.25 MG tablet, TAKE 1 TABLET (6.25MG ) BY MOUTH TWICE DAILY WITH A MEAL, Disp: 180 tablet, Rfl: 1 .  ezetimibe (ZETIA) 10 MG tablet, TAKE ONE TABLET BY MOUTH ONCE DAILY WITH SUPPER, Disp: 90 tablet, Rfl: 3 .  ferrous sulfate (CVS IRON) 325 (65 FE) MG tablet, Take 325 mg by mouth daily with supper. , Disp: , Rfl:  .  fluticasone (FLONASE) 50 MCG/ACT nasal spray, , Disp: , Rfl:  .  furosemide (LASIX) 20 MG tablet, Take 2 tablets (40 mg total) by mouth daily., Disp: 180 tablet, Rfl: 2 .  levothyroxine (SYNTHROID, LEVOTHROID) 50 MCG tablet, Take 25-50 mcg by mouth daily before breakfast. Take 50 mcg (1 tablet) on Tues, Thurs, Sat, Sun and 25 mcg (1/2 tablet) on Mon, Wed, Fri, Disp: , Rfl:  .  Magnesium Hydroxide (MILK OF MAGNESIA PO), Take 20 mLs by mouth daily as needed (for constipation)., Disp: , Rfl:  .  Multiple Vitamins-Minerals (CENTRUM SILVER PO), Take 1 tablet by mouth at bedtime. , Disp: , Rfl:  .  nitroGLYCERIN (NITROSTAT) 0.4 MG SL tablet, DISSOLVE ONE TABLET UNDER THE TONGUE EVERY 5 MINUTES AS NEEDED FOR CHEST PAIN.  DO NOT EXCEED A TOTAL OF 3 DOSES IN 15 MINUTES, Disp: 25 tablet, Rfl: 0 .  pantoprazole (PROTONIX) 40 MG tablet, Take 40 mg by mouth daily. , Disp: , Rfl:  .  potassium chloride (K-DUR,KLOR-CON) 10 MEQ  tablet, Take 1 tablet (10 mEq total) by mouth daily., Disp: 30 tablet, Rfl: 0 .  rosuvastatin (CRESTOR) 20 MG tablet, Take 1 tablet (20 mg total) by mouth daily., Disp: 90 tablet, Rfl: 3 .  sodium chloride (OCEAN) 0.65 % SOLN nasal spray, Place 1-2 sprays into both nostrils daily as needed for congestion. , Disp: , Rfl:  .  tamsulosin (FLOMAX) 0.4 MG CAPS capsule, Take 0.4 mg by mouth at bedtime. , Disp: , Rfl:    Allergies  Allergen Reactions  . Ramipril Other (See Comments)    cough  . Penicillins Itching and Rash    Has patient had a PCN reaction causing immediate rash, facial/tongue/throat swelling, SOB or lightheadedness with hypotension: No Has patient had a PCN reaction causing severe rash involving mucus membranes or skin necrosis: Yes-back and hands. Has patient had a PCN reaction that required hospitalization: No Has patient had a PCN reaction occurring within the last 10 years: Yes If all of the above answers are "NO", then may proceed with Cephalosporin use.     Objective:  Physical Examination:  Vascular Examination: Capillary refill time <3 seconds x 10 digits.  Palpable DP/PT pulses b/l.  Digital hair absent b/l.  No edema noted b/l.  Skin temperature gradient WNL b/l.  Dermatological Examination: Skin with normal turgor, texture and tone b/l.  No  open wounds b/l.  No interdigital macerations noted b/l.  Elongated, thick, discolored brittle toenails with subungual debris and pain on dorsal palpation of nailbeds 1-5 b/l. Incurvated nailplate b/l great toes with tenderness to palpation. No erythema, no edema, no drainage noted.  Musculoskeletal Examination: Muscle strength 5/5 to all muscle groups b/l.  Hammertoes 2-5 b/l.  No pain, crepitus or joint discomfort with active/passive ROM.  Neurological Examination: Sensation intact 5/5 b/l with 10 gram monofilament.  Vibratory sensation intact b/l.  Proprioceptive sensation intact  b/l.  Assessment: Mycotic nail infection with pain 1-5 b/l Ingrown toenails, noninfected  Plan: Toenails 1-5 b/l were debrided in length and girth without iatrogenic laceration. Offending nail borders debrided and curretaged b/l great toes. Borders cleansed with alcohol. Antibiotic ointment applied. Wife instructed to apply triple antibiotic ointment to both great toes once daily for one week. 2.  Continue soft, supportive shoe gear daily. 3.  Report any pedal injuries to medical professional. 4.  Follow up 9 weeks. 5.  Patient/POA to call should there be a question/concern in there interim.

## 2019-06-23 ENCOUNTER — Other Ambulatory Visit: Payer: Self-pay

## 2019-06-29 ENCOUNTER — Telehealth: Payer: Self-pay | Admitting: *Deleted

## 2019-06-29 DIAGNOSIS — I480 Paroxysmal atrial fibrillation: Secondary | ICD-10-CM

## 2019-06-29 MED ORDER — AMIODARONE HCL 200 MG PO TABS: 200.0000 mg | ORAL_TABLET | Freq: Every day | ORAL | 11 refills | Status: DC

## 2019-06-29 NOTE — Telephone Encounter (Signed)
Spoke with patient and wife. Advised of Dr. Clayborne Dana recommendations to restart amiodarone 200mg  daily. They verbalize understanding. Prescription sent electronically to preferred pharmacy. Pt and wife aware that I will call back with any additional recommendations from Dr. Caryl Comes. No further questions or concerns.

## 2019-06-29 NOTE — Telephone Encounter (Signed)
Merlin alert received for increasing AT/AF burden, long episode duration. Presenting rhythm as of 06/29/19 at 02:00 is AP/BiVP @ 60bpm. AT/AF burden 3.0% since 08/2018, but increasing since 05/2019. V rates generally controlled. Longest episode ~27hrs duration. BiVP 98%.  Spoke with patient and wife. Pt reports that in the past few weeks he has noted increased fatigue, especially with activity. Also notes intermittent nocturnal SOB, wakes him up from sleep. Sits up/gets out of bed and symptoms improve. CorVue at baseline. He denies palpitations or chest discomfort. Reports compliance with cardiac medications, including Eliquis. Amio previously d/c due to tremors. Will route to Dr. Caryl Comes and Dr. Haroldine Laws for recommendations. Advised pt to call back for any new or worsening symptoms in the interim. Pt and wife verbalize understanding and agreement with plan.

## 2019-06-29 NOTE — Telephone Encounter (Signed)
Amiodarone stopped due to tremors. Restart amio at 200 daily please. Await Dr. Olin Pia input as well. Thanks

## 2019-07-02 NOTE — Telephone Encounter (Signed)
Remote transmission is scheduled for 07/21/19.

## 2019-07-02 NOTE — Telephone Encounter (Signed)
  Raquel Sarna and [place]  Agree with amio  And lets set up telehealth visit or OV in the next 4-6 weeks to assess With remote transmission in about 2 weeks to see if has reverted to sinus  Thanks SK

## 2019-07-05 NOTE — Telephone Encounter (Signed)
Patient is scheduled for telehealth visit with Dr. Caryl Comes on 08/05/19 at 14:30.

## 2019-07-08 NOTE — Telephone Encounter (Addendum)
Merlin alert transmission received for AT/AF burden. Presenting rhythm AP/BiVP at 83bpm. AT/AF burden now 3.9%. Will reassess burden trend as of scheduled 07/21/19 transmission.    Presenting rhythm as of 07/08/19 at 04:00:

## 2019-07-09 DIAGNOSIS — Z23 Encounter for immunization: Secondary | ICD-10-CM | POA: Diagnosis not present

## 2019-07-09 NOTE — Telephone Encounter (Signed)
Noted. Thanks.

## 2019-07-13 DIAGNOSIS — N184 Chronic kidney disease, stage 4 (severe): Secondary | ICD-10-CM | POA: Diagnosis not present

## 2019-07-13 DIAGNOSIS — D472 Monoclonal gammopathy: Secondary | ICD-10-CM | POA: Diagnosis not present

## 2019-07-21 ENCOUNTER — Ambulatory Visit (INDEPENDENT_AMBULATORY_CARE_PROVIDER_SITE_OTHER): Payer: Medicare Other | Admitting: *Deleted

## 2019-07-21 DIAGNOSIS — N184 Chronic kidney disease, stage 4 (severe): Secondary | ICD-10-CM | POA: Diagnosis not present

## 2019-07-21 DIAGNOSIS — I5022 Chronic systolic (congestive) heart failure: Secondary | ICD-10-CM

## 2019-07-21 DIAGNOSIS — I129 Hypertensive chronic kidney disease with stage 1 through stage 4 chronic kidney disease, or unspecified chronic kidney disease: Secondary | ICD-10-CM | POA: Diagnosis not present

## 2019-07-21 DIAGNOSIS — D472 Monoclonal gammopathy: Secondary | ICD-10-CM | POA: Diagnosis not present

## 2019-07-21 DIAGNOSIS — I255 Ischemic cardiomyopathy: Secondary | ICD-10-CM

## 2019-07-21 DIAGNOSIS — I509 Heart failure, unspecified: Secondary | ICD-10-CM | POA: Diagnosis not present

## 2019-07-21 LAB — CUP PACEART REMOTE DEVICE CHECK
Battery Remaining Longevity: 18 mo
Battery Remaining Percentage: 25 %
Battery Voltage: 2.81 V
Brady Statistic AP VP Percent: 95 %
Brady Statistic AP VS Percent: 1 %
Brady Statistic AS VP Percent: 4.3 %
Brady Statistic AS VS Percent: 1 %
Brady Statistic RA Percent Paced: 91 %
Date Time Interrogation Session: 20201006060023
HighPow Impedance: 49 Ohm
HighPow Impedance: 50 Ohm
Implantable Lead Implant Date: 20060509
Implantable Lead Implant Date: 20060509
Implantable Lead Implant Date: 20090417
Implantable Lead Location: 753858
Implantable Lead Location: 753859
Implantable Lead Location: 753860
Implantable Lead Model: 5076
Implantable Lead Model: 7001
Implantable Pulse Generator Implant Date: 20141208
Lead Channel Impedance Value: 390 Ohm
Lead Channel Impedance Value: 400 Ohm
Lead Channel Impedance Value: 480 Ohm
Lead Channel Pacing Threshold Amplitude: 0.75 V
Lead Channel Pacing Threshold Amplitude: 1 V
Lead Channel Pacing Threshold Amplitude: 1 V
Lead Channel Pacing Threshold Pulse Width: 0.5 ms
Lead Channel Pacing Threshold Pulse Width: 0.8 ms
Lead Channel Pacing Threshold Pulse Width: 0.8 ms
Lead Channel Sensing Intrinsic Amplitude: 12 mV
Lead Channel Sensing Intrinsic Amplitude: 3.1 mV
Lead Channel Setting Pacing Amplitude: 2 V
Lead Channel Setting Pacing Amplitude: 2 V
Lead Channel Setting Pacing Amplitude: 2 V
Lead Channel Setting Pacing Pulse Width: 0.8 ms
Lead Channel Setting Pacing Pulse Width: 0.8 ms
Lead Channel Setting Sensing Sensitivity: 0.5 mV
Pulse Gen Serial Number: 7138995

## 2019-07-30 NOTE — Progress Notes (Signed)
Remote ICD transmission.   

## 2019-07-30 NOTE — Progress Notes (Signed)
Electrophysiology Office Note Date: 08/02/2019  ID:  [name], DOB Dec 23, 1931, MRN [id number]  PCP: [name], MD Primary Cardiologist: No primary care provider on file. Electrophysiologist: Dr. Caryl Comes  CC: Routine ICD follow-up  [name] is a 85 y.o. male with history of CAD/PCI, chronic systolic CHF, ICM with ICD, PAF (had GIB on coumadin) on Eliquis, CKD IV, HTN, HLD, tremor, and h/o Bladder CA seen today for Dr. Caryl Comes.  They present today for routine electrophysiology followup.  Since last being seen in our clinic, the patient reports doing very well. They deny chest pain, palpitations, dyspnea, PND, orthopnea, nausea, vomiting, dizziness, syncope, edema, weight gain, or early satiety.  He has not had ICD shocks.   Device History: St. Jude BiV ICD implanted 2008, LV lead 2009, gen change 09/21/13. Dr. Caryl Comes noted device programmed to exclude the proximal ring on the LV lead.  History of appropriate therapy: No History of AAD therapy: Yes (previously on amiodarone)   Past Medical History:  Diagnosis Date  . Acute bronchitis with COPD ([place]) 09/10/2016  . Anemia   . Atrial fibrillation or flutter    maintaining sinus on amiodarone    . Bladder cancer ([place]) dx'd 05/2012  . BPH (benign prostatic hyperplasia)   . CAD (coronary artery disease)     a. s/p anterior MI 12/05 c/b shock -> stent LAD   b. s/p stenting OM-1, 2/06  . CHF (congestive heart failure) (HCC)    due to ischemic CM  a. EF 20-30%. (Nov 2008)   b. s/p St. Jude BiV-ICD    c. CPX 07/2008  pvo2 16.3 (63% predicted) slope 34 RER 1.08 O2 pulse 93%  . Cough    occasional /productive, no fever/ not new  . CRI (chronic renal insufficiency)    (baseline 2.0-2.2)/ recent hospitalization 8/13  . GERD (gastroesophageal reflux disease)    hx Barretts esophagitis  . History of blood transfusion    following coumadin  usage  . HTN (hypertension)    EKG 05/14/12,Chest x ray 8/13, last ICD interrogation 8/13  EPIC  . Hyperlipidemia   . Hypothyroidism   . 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 Skin cancer    basal cells   facial x 4, 1 right forearm  . Sleep apnea    STOP BANG SCORE 4   Past Surgical History:  Procedure Laterality Date  . [name]   lower back  . CARDIAC DEFIBRILLATOR PLACEMENT     ICD St Jude; gen change 09-20-13  . CERVICAL LAMINECTOMY  1985  . COLONOSCOPY    . CORONARY ANGIOPLASTY  2005,2006  . CYSTOSCOPY W/ RETROGRADES  05/25/2012   Procedure: CYSTOSCOPY WITH RETROGRADE PYELOGRAM;  Surgeon: Molli Hazard, MD;  Location: [facility];  Service: Urology;  Laterality: Bilateral;  . CYSTOSCOPY W/ URETERAL STENT PLACEMENT  07/08/2012   Procedure: CYSTOSCOPY WITH STENT REPLACEMENT;  Surgeon: Molli Hazard, MD;  Location: [facility];  Service: Urology;  Laterality: Left;  . CYSTOSCOPY W/ URETERAL STENT REMOVAL  07/08/2012   Procedure: CYSTOSCOPY WITH STENT REMOVAL;  Surgeon: Molli Hazard, MD;  Location: [facility];  Service: Urology;  Laterality: Bilateral;  . CYSTOSCOPY/RETROGRADE/URETEROSCOPY  07/08/2012   Procedure: CYSTOSCOPY/RETROGRADE/URETEROSCOPY;  Surgeon: Molli Hazard, MD;  Location: [facility];  Service: Urology;  Laterality: Left;  . ESOPHAGOGASTRODUODENOSCOPY    .  HERNIA REPAIR  1989   bilateral  . IMPLANTABLE CARDIOVERTER DEFIBRILLATOR (ICD) GENERATOR CHANGE N/A 09/20/2013   Procedure: ICD GENERATOR CHANGE;  Surgeon: Deboraha Sprang, MD;  Location: [facility] CATH LAB;  Service: Cardiovascular;  Laterality: N/A;  . TEE WITHOUT CARDIOVERSION N/A 12/10/2017   Procedure: TRANSESOPHAGEAL ECHOCARDIOGRAM (TEE);  Surgeon: Jerline Pain, MD;  Location[facility] ENDOSCOPY;  Service: Cardiovascular;  Laterality: N/A;  . TRANSURETHRAL RESECTION OF BLADDER TUMOR  05/25/2012   cold cup biopsy prostate  . TRANSURETHRAL RESECTION OF BLADDER TUMOR   07/08/2012   Procedure: TRANSURETHRAL RESECTION OF BLADDER TUMOR (TURBT);  Surgeon: Molli Hazard, MD;  Location: [facility];  Service: Urology;  Laterality: N/A;  CYSTO, TURBT W/ GYRUS, BILATERAL STENT REMOVAL, LEFT URETEROSCOPY WITH BIOPSY, POSSIBLE LEFT STENT PLACEMENT     Current Outpatient Medications  Medication Sig Dispense Refill  . amiodarone (PACERONE) 200 MG tablet Take 1 tablet (200 mg total) by mouth daily. 30 tablet 11  . apixaban (ELIQUIS) 2.5 MG TABS tablet Take 1 tablet (2.5 mg total) by mouth 2 (two) times daily. 60 tablet 5  . carvedilol (COREG) 6.25 MG tablet TAKE 1 TABLET (6.25MG ) BY MOUTH TWICE DAILY WITH A MEAL 180 tablet 1  . ezetimibe (ZETIA) 10 MG tablet TAKE ONE TABLET BY MOUTH ONCE DAILY WITH SUPPER 90 tablet 3  . ferrous sulfate ([employer] IRON) 325 (65 FE) MG tablet Take 325 mg by mouth daily with supper.     . furosemide (LASIX) 20 MG tablet Take 2 tablets (40 mg total) by mouth daily. 180 tablet 2  . levothyroxine (SYNTHROID, LEVOTHROID) 50 MCG tablet Take 25-50 mcg by mouth daily before breakfast. Take 50 mcg (1 tablet) on Tues, Thurs, Sat, Sun and 25 mcg (1/2 tablet) on Mon, Wed, Fri    . Multiple Vitamins-Minerals (CENTRUM PO) Take 1 tablet by mouth daily.    . nitroGLYCERIN (NITROSTAT) 0.4 MG SL tablet DISSOLVE ONE TABLET UNDER THE TONGUE EVERY 5 MINUTES AS NEEDED FOR CHEST PAIN.  DO NOT EXCEED A TOTAL OF 3 DOSES IN 15 MINUTES 25 tablet 0  . pantoprazole (PROTONIX) 40 MG tablet Take 40 mg by mouth daily.     . potassium chloride (K-DUR,KLOR-CON) 10 MEQ tablet Take 1 tablet (10 mEq total) by mouth daily. 30 tablet 0  . rosuvastatin (CRESTOR) 20 MG tablet Take 1 tablet (20 mg total) by mouth daily. 90 tablet 3  . tamsulosin (FLOMAX) 0.4 MG CAPS capsule Take 0.4 mg by mouth at bedtime.      No current facility-administered medications for this visit.     Allergies:   Ramipril and Penicillins   Social History: Social History   Socioeconomic History  .  Marital status: Married    Spouse name: Not on file  . Number of children: Not on file  . Years of education: Not on file  . Highest education level: Not on file  Occupational History  . Occupation: retired  Scientific laboratory technician  . Financial resource strain: Not on file  . Food insecurity    Worry: Not on file    Inability: Not on file  . Transportation needs    Medical: Not on file    Non-medical: Not on file  Tobacco Use  . Smoking status: Former Smoker    Types: Cigarettes, Cigars    Quit date: 03/02/2004    Years since quitting: 15.4  . Smokeless tobacco: Former Systems developer    Quit date: 05/20/1999  . Tobacco comment: quit in 2005  Substance and Sexual Activity  . Alcohol use: No  . Drug use: No  . Sexual activity: Not on file  Lifestyle  . Physical activity    Days per week: Not on file    Minutes per session: Not on file  . Stress: Not on file  Relationships  . Social Herbalist on phone: Not on file    Gets together: Not on file    Attends religious service: Not on file    Active member of club or organization: Not on file    Attends meetings of clubs or organizations: Not on file    Relationship status: Not on file  . Intimate partner violence    Fear of current or ex partner: Not on file    Emotionally abused: Not on file    Physically abused: Not on file    Forced sexual activity: Not on file  Other Topics Concern  . Not on file  Social History Narrative  . Not on file    Family History: Family History  Problem Relation Age of Onset  . Coronary artery disease Other   . Stroke Other     Review of Systems: All other systems reviewed and are otherwise negative except as noted above.   Physical Exam: Vitals:   08/02/19 1244  BP: 110/66  Pulse: 60  SpO2: 97%  Weight: 186 lb 9.6 oz (84.6 kg)  Height: 5\' 10"  (1.778 m)     GEN- The patient is well appearing, alert and oriented x 3 today.   HEENT: normocephalic, atraumatic; sclera clear, conjunctiva  pink; hearing intact; oropharynx clear; neck supple, no JVP Lymph- no cervical lymphadenopathy Lungs- Clear to ausculation bilaterally, normal work of breathing.  No wheezes, rales, rhonchi Heart- Regular rate and rhythm, no murmurs, rubs or gallops, PMI not laterally displaced GI- soft, non-tender, non-distended, bowel sounds present, no hepatosplenomegaly Extremities- no clubbing, cyanosis, or edema; DP/PT/radial pulses 2+ bilaterally MS- no significant deformity or atrophy Skin- warm and dry, no rash or lesion; ICD pocket well healed Psych- euthymic mood, full affect Neuro- strength and sensation are intact  ICD interrogation- reviewed in detail today,  See PACEART report  EKG:  EKG is ordered today. The ekg ordered today shows AV pacing at 60 bpm, PR 168 ms  Recent Labs: 12/08/2018: BUN 31; Creatinine, Ser 2.49; Hemoglobin 13.1; Platelets 203; Potassium 3.6; Sodium 141   Wt Readings from Last 3 Encounters:  08/02/19 186 lb 9.6 oz (84.6 kg)  06/03/19 186 lb 3.2 oz (84.5 kg)  12/08/18 184 lb 4 oz (83.6 kg)     Other studies Reviewed: TEE: 12/10/2017 - Left ventricle: Systolic function was severely reduced. The estimated ejection fraction was in the range of 20% to 25%. Wall motion was normal; there were no regional wall motion abnormalities. - Aortic valve: There was trivial regurgitation. - Mitral valve: There was moderate regurgitation. - Poor images, likely inhibited by moderate hiatal hernia. However, no gross evidence of vegetation.  ECHO:12/08/2017- Left ventricle - 20% to 25%.  10/19/15: TTE - Left ventricle: 25% to 30%.   PCI: 02/07/2006Successful PTCA placement of a drug-eluting stent in the first obtuse marginal branch. An 80% stenosis was reduced to 0% residual with TIMI III flow.  CATH:09/18/2004 1. Elevated left ventricular end diastolic pressure. 2. Patent stent in the left anterior descending artery and patent angioplasty site  of the second diagonal branch. 3. No change in disease in the left circumflex as described.  Assessment and  Plan:  1.  Chronic systolic dysfunction s/p St. Jude CRT-D  Echo 11/2017 LVEF 20-25% Euvolemic today on exam Continue coreg 6.25 mg BID Not on ACE/ARB due to chronic hypotension and CKD IV.  Normal ICD function See Claudia Desanctis Art report No changes today  2. CAD Denies symptoms of ischemia  3. Paroxysmal Afib He is in NSR by device and EKG today.  On Eliquis for CHA2DS2VASC of at least 4.  Recently started back on amiodarone. AF burden 5.4% by device check today. His burden has improved with re-initiation as below.  Continue amiodarone 200 mg daily. He states he got labs at nephrology last week. Will request. He will need regular surveillance labs on amio. Of note, he has previously had tremors on amiodarone -> stopped -> tremors did NOT improve.     Current medicines are reviewed at length with the patient today.   The patient does not have concerns regarding his medicines.  The following changes were made today:  none  Labs/ tests ordered today include:  Will obtaine recent labs from [facility].   Disposition:   Follow up with Dr. Caryl Comes this week virtually. We will discuss with Dr. Caryl Comes and his RN if this is still warranted. Otherwise, would follow up in 6 months with CRT device.    Jacalyn Lefevre, PA-C  08/02/2019 2:53 PM  [address] [place] [id number] [phone] (office) [phone] (fax)

## 2019-08-02 ENCOUNTER — Telehealth: Payer: Self-pay | Admitting: Student

## 2019-08-02 ENCOUNTER — Other Ambulatory Visit: Payer: Self-pay

## 2019-08-02 ENCOUNTER — Encounter: Payer: Self-pay | Admitting: Student

## 2019-08-02 ENCOUNTER — Ambulatory Visit (INDEPENDENT_AMBULATORY_CARE_PROVIDER_SITE_OTHER): Payer: Medicare Other | Admitting: Student

## 2019-08-02 ENCOUNTER — Telehealth (HOSPITAL_COMMUNITY): Payer: Self-pay | Admitting: *Deleted

## 2019-08-02 VITALS — BP 110/66 | HR 60 | Ht 70.0 in | Wt 186.6 lb

## 2019-08-02 DIAGNOSIS — I480 Paroxysmal atrial fibrillation: Secondary | ICD-10-CM

## 2019-08-02 DIAGNOSIS — I5022 Chronic systolic (congestive) heart failure: Secondary | ICD-10-CM

## 2019-08-02 DIAGNOSIS — Z95810 Presence of automatic (implantable) cardiac defibrillator: Secondary | ICD-10-CM

## 2019-08-02 DIAGNOSIS — I255 Ischemic cardiomyopathy: Secondary | ICD-10-CM | POA: Diagnosis not present

## 2019-08-02 DIAGNOSIS — I2510 Atherosclerotic heart disease of native coronary artery without angina pectoris: Secondary | ICD-10-CM | POA: Diagnosis not present

## 2019-08-02 LAB — CUP PACEART INCLINIC DEVICE CHECK
Battery Remaining Longevity: 18 mo
Brady Statistic RA Percent Paced: 90 %
Brady Statistic RV Percent Paced: 96 %
Date Time Interrogation Session: 20201019150058
HighPow Impedance: 52.5783
Implantable Lead Implant Date: 20060509
Implantable Lead Implant Date: 20060509
Implantable Lead Implant Date: 20090417
Implantable Lead Location: 753858
Implantable Lead Location: 753859
Implantable Lead Location: 753860
Implantable Lead Model: 5076
Implantable Lead Model: 7001
Implantable Pulse Generator Implant Date: 20141208
Lead Channel Impedance Value: 400 Ohm
Lead Channel Impedance Value: 412.5 Ohm
Lead Channel Impedance Value: 537.5 Ohm
Lead Channel Pacing Threshold Amplitude: 0.75 V
Lead Channel Pacing Threshold Amplitude: 0.75 V
Lead Channel Pacing Threshold Amplitude: 0.75 V
Lead Channel Pacing Threshold Amplitude: 1.125 V
Lead Channel Pacing Threshold Pulse Width: 0.5 ms
Lead Channel Pacing Threshold Pulse Width: 0.5 ms
Lead Channel Pacing Threshold Pulse Width: 0.8 ms
Lead Channel Pacing Threshold Pulse Width: 0.8 ms
Lead Channel Sensing Intrinsic Amplitude: 12 mV
Lead Channel Sensing Intrinsic Amplitude: 3.9 mV
Lead Channel Setting Pacing Amplitude: 2 V
Lead Channel Setting Pacing Amplitude: 2 V
Lead Channel Setting Pacing Amplitude: 2.125
Lead Channel Setting Pacing Pulse Width: 0.8 ms
Lead Channel Setting Pacing Pulse Width: 0.8 ms
Lead Channel Setting Sensing Sensitivity: 0.5 mV
Pulse Gen Serial Number: 7138995

## 2019-08-02 NOTE — Telephone Encounter (Signed)
Returned to call to pt / wife.  Pt doesn't need any physical assistance.  Mrs. Neitzke's only concern was pt's medications. She did state that she did have a list that she could send with pt. She understood the rules about no visitor policy and stated that if there was any questions re: meds that she can be reached by cell phone.

## 2019-08-02 NOTE — Telephone Encounter (Signed)
New Message    Patient's wife would like to accompany him to appointment please call to advise.

## 2019-08-02 NOTE — Telephone Encounter (Signed)
Pt and wife called to ask about the amio, they state it was restarted at his last OV with [place] back in Aug, however there is no mention of this in OV note.  She states since then his tremors has gotten worse and they want to know if they can just stop the Amio.  Will send to Dr Haroldine Laws for review/recommendations

## 2019-08-03 NOTE — Telephone Encounter (Signed)
He is back on amio for PAF. Seen in EP yesterday and AF burden improved with amio 200 daily. Now 5.4%  Would continue amio if possible. If unable to tolerate 200mg  daily can take every other day or take 100mg  daily and see if that is more tolerable.

## 2019-08-03 NOTE — Addendum Note (Signed)
Addended by: Mady Haagensen on: 08/03/2019 10:09 AM   Modules accepted: Orders

## 2019-08-05 ENCOUNTER — Telehealth: Payer: Medicare Other | Admitting: Internal Medicine

## 2019-08-05 MED ORDER — AMIODARONE HCL 200 MG PO TABS: 100.0000 mg | ORAL_TABLET | Freq: Every day | ORAL | 11 refills | Status: DC

## 2019-08-05 NOTE — Patient Instructions (Signed)
Medication Instructions:   Your physician recommends that you continue on your current medications as directed. Please refer to the Current Medication list given to you today.  *If you need a refill on your cardiac medications before your next appointment, please call your pharmacy*  Lab Work:  None ordered today  Testing/Procedures:  None ordered today  Follow-Up: At [facility], you and your health needs are our priority.  As part of our continuing mission to provide you with exceptional heart care, we have created designated Provider Care Teams.  These Care Teams include your primary Cardiologist (physician) and Advanced Practice Providers (APPs -  Physician Assistants and Nurse Practitioners) who all work together to provide you with the care you need, when you need it.  Your next appointment:   6 months  The format for your next appointment:   In Person  Provider:   Virl Axe, MD

## 2019-08-05 NOTE — Telephone Encounter (Signed)
Pt's wife is aware, she states pt just can not tolerate the 200 mg, they will decrease to 100 mg and see if he can tolerate that dose.

## 2019-08-13 ENCOUNTER — Encounter: Payer: Self-pay | Admitting: Podiatry

## 2019-08-13 ENCOUNTER — Other Ambulatory Visit: Payer: Self-pay

## 2019-08-13 ENCOUNTER — Ambulatory Visit (INDEPENDENT_AMBULATORY_CARE_PROVIDER_SITE_OTHER): Payer: Medicare Other | Admitting: Podiatry

## 2019-08-13 DIAGNOSIS — M79674 Pain in right toe(s): Secondary | ICD-10-CM

## 2019-08-13 DIAGNOSIS — M79675 Pain in left toe(s): Secondary | ICD-10-CM

## 2019-08-13 DIAGNOSIS — Z7901 Long term (current) use of anticoagulants: Secondary | ICD-10-CM

## 2019-08-13 DIAGNOSIS — B351 Tinea unguium: Secondary | ICD-10-CM | POA: Diagnosis not present

## 2019-08-13 DIAGNOSIS — Z9229 Personal history of other drug therapy: Secondary | ICD-10-CM

## 2019-08-13 DIAGNOSIS — L600 Ingrowing nail: Secondary | ICD-10-CM

## 2019-08-14 NOTE — Progress Notes (Signed)
Subjective: [name] is a 85 y.o. y.o. male who is on long term blood thinner, Eliquis,  presents today with painful, discolored, thick toenails  which interfere with daily activities. Pain is aggravated when wearing enclosed shoe gear. Pain is relieved with periodic professional debridement.  Patient's wife is his primary caregiver and he is concerned she was not able to come to appointment with him on today.   Current Outpatient Medications on File Prior to Visit  Medication Sig Dispense Refill  . amiodarone (PACERONE) 200 MG tablet Take 0.5 tablets (100 mg total) by mouth daily. 30 tablet 11  . apixaban (ELIQUIS) 2.5 MG TABS tablet Take 1 tablet (2.5 mg total) by mouth 2 (two) times daily. 60 tablet 5  . carvedilol (COREG) 6.25 MG tablet TAKE 1 TABLET (6.25MG ) BY MOUTH TWICE DAILY WITH A MEAL 180 tablet 1  . ezetimibe (ZETIA) 10 MG tablet TAKE ONE TABLET BY MOUTH ONCE DAILY WITH SUPPER 90 tablet 3  . ferrous sulfate ([employer]S IRON) 325 (65 FE) MG tablet Take 325 mg by mouth daily with supper.     . furosemide (LASIX) 20 MG tablet Take 2 tablets (40 mg total) by mouth daily. 180 tablet 2  . levothyroxine (SYNTHROID, LEVOTHROID) 50 MCG tablet Take 25-50 mcg by mouth daily before breakfast. Take 50 mcg (1 tablet) on Tues, Thurs, Sat, Sun and 25 mcg (1/2 tablet) on Mon, Wed, Fri    . Multiple Vitamins-Minerals (CENTRUM PO) Take 1 tablet by mouth daily.    . nitroGLYCERIN (NITROSTAT) 0.4 MG SL tablet DISSOLVE ONE TABLET UNDER THE TONGUE EVERY 5 MINUTES AS NEEDED FOR CHEST PAIN.  DO NOT EXCEED A TOTAL OF 3 DOSES IN 15 MINUTES 25 tablet 0  . pantoprazole (PROTONIX) 40 MG tablet Take 40 mg by mouth daily.     . potassium chloride (K-DUR,KLOR-CON) 10 MEQ tablet Take 1 tablet (10 mEq total) by mouth daily. 30 tablet 0  . rosuvastatin (CRESTOR) 20 MG tablet Take 1 tablet (20 mg total) by mouth daily. 90 tablet 3  . tamsulosin (FLOMAX) 0.4 MG CAPS capsule Take 0.4 mg by mouth at bedtime.      No  current facility-administered medications on file prior to visit.      Allergies  Allergen Reactions  . Ramipril Other (See Comments)    cough  . Penicillins Itching and Rash    Has patient had a PCN reaction causing immediate rash, facial/tongue/throat swelling, SOB or lightheadedness with hypotension: No Has patient had a PCN reaction causing severe rash involving mucus membranes or skin necrosis: Yes-back and hands. Has patient had a PCN reaction that required hospitalization: No Has patient had a PCN reaction occurring within the last 10 years: Yes If all of the above answers are "NO", then may proceed with Cephalosporin use.    Objective: 85 yo Caucasian male, WD, WN in NAD. AAO x 3.  Vascular Examination: Capillary refill time <3 seconds b/l.   Dorsalis pedis pulses palpable b/l.  Posterior tibial pulses palpable b/l.  No digital hair x 10 digits absent b/l.  Skin temperature gradient WNL b/l.  Dermatological Examination: Skin with normal turgor, texture and tone b/l.   No open wounds b/l.  Toenails 1-5 b/l discolored, thick, dystrophic with subungual debris and pain with palpation to nailbeds due to thickness of nails. Incurvated nailplate b/l great toes with tenderness to palpation. No erythema, no edema, no drainage noted.  Skin plaque left medial heel.  Musculoskeletal: Muscle strength 5/5 to all  LE muscle groups b/l.  Hammertoes 2-5 b/l.   Neurological: Sensation intact 5/5 b/l with 10 gram monofilament.  Vibratory sensation intact b/l.   Assessment: Painful onychomycosis toenails 1-5 b/l in patient on blood thinner.  Ingrown toenails b/l great toes, noninfected Xerosis left heel  Plan: 1. Toenails 1-5 b/l were debrided in length and girth without iatrogenic bleeding. Offending nail borders debrided and curretaged b/l great toes. Borders cleansed with alcohol. Antibiotic ointment applied. No further treatment required by patient. 2. Plaque left heel  pared with minute laceration/bleeding sustained during paring. Treated with Lumicain Hemostatic Solution. Antibiotic ointment and bandaid applied. He may remove dressing on tomorrow. No further treatment required. 3. Patient to continue soft, supportive shoe gear daily. 4. Patient to report any pedal injuries to medical professional immediately. 5. Avoid self trimming due to use of blood thinner. 6. Follow up 9 weeks. Note made in chart to allow wife to accompany him.  7. Patient/POA to call should there be a concern in the interim.

## 2019-08-23 DIAGNOSIS — I10 Essential (primary) hypertension: Secondary | ICD-10-CM | POA: Diagnosis not present

## 2019-08-23 DIAGNOSIS — Z125 Encounter for screening for malignant neoplasm of prostate: Secondary | ICD-10-CM | POA: Diagnosis not present

## 2019-08-23 DIAGNOSIS — E038 Other specified hypothyroidism: Secondary | ICD-10-CM | POA: Diagnosis not present

## 2019-08-23 DIAGNOSIS — R739 Hyperglycemia, unspecified: Secondary | ICD-10-CM | POA: Diagnosis not present

## 2019-08-23 DIAGNOSIS — E7849 Other hyperlipidemia: Secondary | ICD-10-CM | POA: Diagnosis not present

## 2019-08-23 DIAGNOSIS — I480 Paroxysmal atrial fibrillation: Secondary | ICD-10-CM | POA: Diagnosis not present

## 2019-08-25 DIAGNOSIS — R82998 Other abnormal findings in urine: Secondary | ICD-10-CM | POA: Diagnosis not present

## 2019-08-30 DIAGNOSIS — L600 Ingrowing nail: Secondary | ICD-10-CM | POA: Diagnosis not present

## 2019-08-30 DIAGNOSIS — J449 Chronic obstructive pulmonary disease, unspecified: Secondary | ICD-10-CM | POA: Diagnosis not present

## 2019-08-30 DIAGNOSIS — I5022 Chronic systolic (congestive) heart failure: Secondary | ICD-10-CM | POA: Diagnosis not present

## 2019-08-30 DIAGNOSIS — H539 Unspecified visual disturbance: Secondary | ICD-10-CM | POA: Insufficient documentation

## 2019-08-30 DIAGNOSIS — R251 Tremor, unspecified: Secondary | ICD-10-CM | POA: Diagnosis not present

## 2019-08-30 DIAGNOSIS — H538 Other visual disturbances: Secondary | ICD-10-CM | POA: Diagnosis not present

## 2019-08-30 DIAGNOSIS — Z0000 Encounter for general adult medical examination without abnormal findings: Secondary | ICD-10-CM | POA: Diagnosis not present

## 2019-08-30 DIAGNOSIS — Z95810 Presence of automatic (implantable) cardiac defibrillator: Secondary | ICD-10-CM | POA: Diagnosis not present

## 2019-08-30 DIAGNOSIS — I700 Atherosclerosis of aorta: Secondary | ICD-10-CM | POA: Diagnosis not present

## 2019-08-30 DIAGNOSIS — M545 Low back pain: Secondary | ICD-10-CM | POA: Diagnosis not present

## 2019-08-30 DIAGNOSIS — I472 Ventricular tachycardia: Secondary | ICD-10-CM | POA: Diagnosis not present

## 2019-09-15 ENCOUNTER — Telehealth: Payer: Self-pay | Admitting: Internal Medicine

## 2019-09-15 NOTE — Telephone Encounter (Signed)
Pt's spouse reports that pt has issues with mobility d/t tremors and is forgetful. She has requested to come to appt. I advised that it would be ok for her to accompany patient.

## 2019-09-15 NOTE — Telephone Encounter (Signed)
New message   Pt is scheduled for a f/u visit per Dr. Caryl Comes. His wife asked if she would be able to accompany the patient to the appt because she is his care giver.

## 2019-09-16 ENCOUNTER — Telehealth: Payer: Self-pay | Admitting: Internal Medicine

## 2019-09-16 NOTE — Telephone Encounter (Signed)
Spoke with pt's wife and advised 09/24/2019 appointment with Dr. Caryl Comes at 4pm is a virtual visit.  Wife advised pt would receive a phone call from staff up to 1 hr prior to appointment.  Please have medications available for review as well as a recent B/P and pulse reading.  Please send remote device reading in on 09/23/2019.  Request sent to device clinic and K. Kilgore to contact pt's wife to assist in walking her through device check.  Wife informed she will  receive a call from device clinic on 09/23/2019 to assist with device walkthrough.  Pt's wife verbalized understanding and is agreeable with plan.

## 2019-09-16 NOTE — Telephone Encounter (Signed)
Patient's wife is requesting for his appt on 09/24/19 with Dr. Caryl Comes to be virtual. States that she is unaware of what the appt is for. I told her it was to discuss atrial fibrillation and amiodarone. States Dr. Haroldine Laws already changed his amiodarone medication. Would feel more comfortable with a virtual appt rather than coming into the office. Saw Barrington Ellison on 08/02/19.

## 2019-09-23 ENCOUNTER — Telehealth: Payer: Self-pay

## 2019-09-23 NOTE — Telephone Encounter (Signed)
Pt wife called to get help sending a manual transmission with his home remote monitor. I told her I will call her back when I see the transmission. The pt monitor is connected to a land line.

## 2019-09-24 ENCOUNTER — Encounter: Payer: Self-pay | Admitting: Internal Medicine

## 2019-09-24 ENCOUNTER — Telehealth (INDEPENDENT_AMBULATORY_CARE_PROVIDER_SITE_OTHER): Payer: Medicare Other | Admitting: Internal Medicine

## 2019-09-24 ENCOUNTER — Other Ambulatory Visit: Payer: Self-pay

## 2019-09-24 DIAGNOSIS — I255 Ischemic cardiomyopathy: Secondary | ICD-10-CM

## 2019-09-24 NOTE — Telephone Encounter (Signed)
I let the pt wife know that his transmission was received.

## 2019-09-24 NOTE — Progress Notes (Signed)
Electrophysiology TeleHealth Note   Due to national recommendations of social distancing due to COVID 19, an audio/video telehealth visit is felt to be most appropriate for this [name] at this time.  See MyChart message from today for [name] [name] consent to telehealth for [facility].   Date:  09/24/2019   ID:  [name] [name], DOB 1931/12/22, MRN [id number]  Location: [name] home  Provider location: [address], [place]  Evaluation Performed: Follow-up visit  PCP:  [name], MD  Cardiologist:    DB Electrophysiologist:  SK   Chief Complaint:  Atrial fib  History of Present Illness:    [name] [name] is a 85 y.o. male who presents via audio/video conferencing for a telehealth visit today.  Since last being seen in our clinic for afib   [name] [name] reports doing really quite well and termor is better   Recurrent atrial fibrillation and amiodarone was initiated earlier this fall  Previously use of amiodarone was stopped 2/2 tremors which did not improve with its discontinuation   Noted this fall to have recurrent atrial fib-- not with any notable symptoms  Amiodarone restarted with recurrence of tremors which improved w down titration, indeed he and his wife think less than before [name] amio was started  Echo 12/14 LVEF 20-25% Carotid u/s 3/14: 40-59% R 0-39%%L Echo 11/2017: EF 25%      Past Medical History:  Diagnosis Date  . Acute bronchitis with COPD ([place]) 09/10/2016  . Anemia   . Atrial fibrillation or flutter    maintaining sinus on amiodarone    . Bladder cancer ([place]) dx'd 05/2012  . BPH (benign prostatic hyperplasia)   . CAD (coronary artery disease)     a. s/p anterior MI 12/05 c/b shock -> stent LAD   b. s/p stenting OM-1, 2/06  . CHF (congestive heart failure) (H[facility])    due to ischemic CM  a. EF 20-30%. (Nov 2008)   b. s/p St. Jude BiV-ICD    c. CPX 07/2008  pvo2 16.3 (63% predicted) slope 34 RER 1.08 O2 pulse 93%  . Cough    occasional /productive, no fever/ not new  . CRI (chronic renal insufficiency)    (baseline 2.0-2.2)/ recent hospitalization 8/13  . GERD (gastroesophageal reflux disease)    hx Barretts esophagitis  . History of blood transfusion    following coumadin  usage  . HTN (hypertension)    EKG 05/14/12,Chest x ray 8/13, last ICD interrogation 8/13 EPIC  . Hyperlipidemia   . Hypothyroidism   . 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 Skin cancer    basal cells   facial x 4, 1 right forearm  . Sleep apnea    STOP BANG SCORE 4    Past Surgical History:  Procedure Laterality Date  . [place]   lower back  . CARDIAC DEFIBRILLATOR PLACEMENT     ICD St Jude; gen change 09-20-13  . CERVICAL LAMINECTOMY  1985  . COLONOSCOPY    . CORONARY ANGIOPLASTY  2005,2006  . CYSTOSCOPY W/ RETROGRADES  05/25/2012   Procedure: CYSTOSCOPY WITH RETROGRADE PYELOGRAM;  Surgeon: Molli Hazard, MD;  Location: [facility];  Service: Urology;  Laterality: Bilateral;  . CYSTOSCOPY W/ URETERAL STENT PLACEMENT  07/08/2012   Procedure: CYSTOSCOPY WITH STENT REPLACEMENT;  Surgeon: Molli Hazard, MD;  Location: [facility];  Service: Urology;  Laterality: Left;  . CYSTOSCOPY W/ URETERAL STENT REMOVAL  07/08/2012   Procedure: CYSTOSCOPY WITH STENT REMOVAL;  Surgeon: Molli Hazard, MD;  Location: [facility];  Service: Urology;  Laterality: Bilateral;  . CYSTOSCOPY/RETROGRADE/URETEROSCOPY  07/08/2012   Procedure: CYSTOSCOPY/RETROGRADE/URETEROSCOPY;  Surgeon: Molli Hazard, MD;  Location: [facility];  Service: Urology;  Laterality: Left;  . ESOPHAGOGASTRODUODENOSCOPY    . HERNIA REPAIR  1989   bilateral  . IMPLANTABLE CARDIOVERTER DEFIBRILLATOR (ICD) GENERATOR CHANGE N/A 09/20/2013   Procedure: ICD GENERATOR CHANGE;  Surgeon: Deboraha Sprang, MD;  Location: [facility] CATH LAB;  Service:  Cardiovascular;  Laterality: N/A;  . TEE WITHOUT CARDIOVERSION N/A 12/10/2017   Procedure: TRANSESOPHAGEAL ECHOCARDIOGRAM (TEE);  Surgeon: Jerline Pain, MD;  Location: [facility] ENDOSCOPY;  Service: Cardiovascular;  Laterality: N/A;  . TRANSURETHRAL RESECTION OF BLADDER TUMOR  05/25/2012   cold cup biopsy prostate  . TRANSURETHRAL RESECTION OF BLADDER TUMOR  07/08/2012   Procedure: TRANSURETHRAL RESECTION OF BLADDER TUMOR (TURBT);  Surgeon: Molli Hazard, MD;  Location: [facility];  Service: Urology;  Laterality: N/A;  CYSTO, TURBT W/ GYRUS, BILATERAL STENT REMOVAL, LEFT URETEROSCOPY WITH BIOPSY, POSSIBLE LEFT STENT PLACEMENT     Current Outpatient Medications  Medication Sig Dispense Refill  . amiodarone (PACERONE) 200 MG tablet Take 0.5 tablets (100 mg total) by mouth daily. 30 tablet 11  . apixaban (ELIQUIS) 2.5 MG TABS tablet Take 1 tablet (2.5 mg total) by mouth 2 (two) times daily. 60 tablet 5  . carvedilol (COREG) 6.25 MG tablet TAKE 1 TABLET (6.25MG ) BY MOUTH TWICE DAILY WITH A MEAL 180 tablet 1  . ezetimibe (ZETIA) 10 MG tablet TAKE ONE TABLET BY MOUTH ONCE DAILY WITH SUPPER 90 tablet 3  . ferrous sulfate ([facility] IRON) 325 (65 FE) MG tablet Take 325 mg by mouth daily with supper.     . furosemide (LASIX) 20 MG tablet Take 2 tablets (40 mg total) by mouth daily. 180 tablet 2  . levothyroxine (SYNTHROID, LEVOTHROID) 50 MCG tablet Take 25-50 mcg by mouth daily before breakfast. Take 50 mcg (1 tablet) on Tues, Thurs, Sat, Sun and 25 mcg (1/2 tablet) on Mon, Wed, Fri    . Multiple Vitamins-Minerals (CENTRUM PO) Take 1 tablet by mouth daily.    . nitroGLYCERIN (NITROSTAT) 0.4 MG SL tablet DISSOLVE ONE TABLET UNDER [name] TONGUE EVERY 5 MINUTES AS NEEDED FOR CHEST PAIN.  DO NOT EXCEED A TOTAL OF 3 DOSES IN 15 MINUTES 25 tablet 0  . pantoprazole (PROTONIX) 40 MG tablet Take 40 mg by mouth daily.     . potassium chloride (K-DUR,KLOR-CON) 10 MEQ tablet Take 1 tablet (10 mEq total) by mouth daily. 30  tablet 0  . rosuvastatin (CRESTOR) 20 MG tablet Take 1 tablet (20 mg total) by mouth daily. 90 tablet 3  . tamsulosin (FLOMAX) 0.4 MG CAPS capsule Take 0.4 mg by mouth at bedtime.      No current facility-administered medications for this visit.    Allergies:   Ramipril and Penicillins   Social History:  [name] [name]  reports that he quit smoking about 15 years ago. His smoking use included cigarettes and cigars. He quit smokeless tobacco use about 20 years ago. He reports that he does not drink alcohol or use drugs.   Family History:  [name] [name]   family history includes Coronary artery disease in an other family member; Stroke in an other family member.   ROS:  Please see [name]  history of present illness.   All other systems are personally reviewed and negative.    Exam:    Vital Signs:  BP 118/69   Pulse 78   Ht 5\' 10"  (1.778 m)   Wt 179 lb 12.8 oz (81.6 kg)   BMI 25.80 kg/m         Labs/Other Tests and Data Reviewed:    Recent Labs: 12/08/2018: BUN 31; Creatinine, Ser 2.49; Hemoglobin 13.1; Platelets 203; Potassium 3.6; Sodium 141   Wt Readings from Last 3 Encounters:  09/24/19 179 lb 12.8 oz (81.6 kg)  08/02/19 186 lb 9.6 oz (84.6 kg)  06/03/19 186 lb 3.2 oz (84.5 kg)     Other studies personally reviewed: Additional studies/ records that were reviewed today include clinic notes   ASSESSMENT & PLAN:    Ischemic cardiomyopathy  CRT-D [employer]   Left ventricular lead failure to capture  Atrial fibrillation paroxysmal   Heart failure-chronic systolic  Renal insufficiency grade 3-4  .High Risk Medication Surveillance amiodarone  Tremor is better with [name] lower dose amio-- but I am not sure that amio is necessary given [name] absence of symptoms with his Afib.  He has been very sick with CHF so prudence is good  Right now with his tremor relatively quiet on [name] lower dose of amio, will continue and plan to reassess Afib burden on jan remote   I will see  by telehealth in April following that remote  COVID 19 screen [name] [name] denies symptoms of COVID 19 at this time.  [name] importance of social distancing was discussed today.  Follow-up:  telehealth visit  4/21    Current medicines are reviewed at length with [name] [name] today.   [name] [name] does not have concerns regarding his medicines.  [name] following changes were made today:  none  Labs/ tests ordered today include:   No orders of [name] defined types were placed in this encounter.      [name] Risk:  after full review of this patients clinical status, I feel that they are at moderate  risk at this time.  Today, I have spen t8 * minutes with [name] [name] with telehealth technology discussing [name] above.  Signed, Virl Axe, MD  09/24/2019 4:39 PM     [place] [address] [phone] (office) [phone] (fax)

## 2019-10-20 ENCOUNTER — Ambulatory Visit (INDEPENDENT_AMBULATORY_CARE_PROVIDER_SITE_OTHER): Payer: Medicare Other | Admitting: *Deleted

## 2019-10-20 DIAGNOSIS — I5022 Chronic systolic (congestive) heart failure: Secondary | ICD-10-CM | POA: Diagnosis not present

## 2019-10-20 LAB — CUP PACEART REMOTE DEVICE CHECK
Battery Remaining Longevity: 16 mo
Battery Remaining Percentage: 22 %
Battery Voltage: 2.78 V
Brady Statistic AP VP Percent: 97 %
Brady Statistic AP VS Percent: 1 %
Brady Statistic AS VP Percent: 3 %
Brady Statistic AS VS Percent: 1 %
Brady Statistic RA Percent Paced: 84 %
Date Time Interrogation Session: 20210106020015
HighPow Impedance: 51 Ohm
HighPow Impedance: 51 Ohm
Implantable Lead Implant Date: 20060509
Implantable Lead Implant Date: 20060509
Implantable Lead Implant Date: 20090417
Implantable Lead Location: 753858
Implantable Lead Location: 753859
Implantable Lead Location: 753860
Implantable Lead Model: 5076
Implantable Lead Model: 7001
Implantable Pulse Generator Implant Date: 20141208
Lead Channel Impedance Value: 440 Ohm
Lead Channel Impedance Value: 440 Ohm
Lead Channel Impedance Value: 480 Ohm
Lead Channel Pacing Threshold Amplitude: 0.75 V
Lead Channel Pacing Threshold Amplitude: 1 V
Lead Channel Pacing Threshold Amplitude: 1 V
Lead Channel Pacing Threshold Pulse Width: 0.5 ms
Lead Channel Pacing Threshold Pulse Width: 0.8 ms
Lead Channel Pacing Threshold Pulse Width: 0.8 ms
Lead Channel Sensing Intrinsic Amplitude: 12 mV
Lead Channel Sensing Intrinsic Amplitude: 3.8 mV
Lead Channel Setting Pacing Amplitude: 2 V
Lead Channel Setting Pacing Amplitude: 2 V
Lead Channel Setting Pacing Amplitude: 2 V
Lead Channel Setting Pacing Pulse Width: 0.8 ms
Lead Channel Setting Pacing Pulse Width: 0.8 ms
Lead Channel Setting Sensing Sensitivity: 0.5 mV
Pulse Gen Serial Number: 7138995

## 2019-10-25 ENCOUNTER — Other Ambulatory Visit (HOSPITAL_COMMUNITY): Payer: Self-pay | Admitting: Internal Medicine

## 2019-10-29 ENCOUNTER — Encounter: Payer: Self-pay | Admitting: Podiatry

## 2019-10-29 ENCOUNTER — Ambulatory Visit (INDEPENDENT_AMBULATORY_CARE_PROVIDER_SITE_OTHER): Payer: Medicare Other | Admitting: Podiatry

## 2019-10-29 ENCOUNTER — Other Ambulatory Visit: Payer: Self-pay

## 2019-10-29 VITALS — BP 142/87 | HR 69

## 2019-10-29 DIAGNOSIS — M79675 Pain in left toe(s): Secondary | ICD-10-CM

## 2019-10-29 DIAGNOSIS — B351 Tinea unguium: Secondary | ICD-10-CM

## 2019-10-29 DIAGNOSIS — M79674 Pain in right toe(s): Secondary | ICD-10-CM

## 2019-10-29 MED ORDER — NONFORMULARY OR COMPOUNDED ITEM: 0 refills | Status: DC

## 2019-10-29 NOTE — Progress Notes (Signed)
Subjective: [name] is seen today for follow up painful, elongated, thickened toenails bilateral feet that he cannot cut. Pain interferes with daily activities. Aggravating factor includes wearing enclosed shoe gear and relieved with periodic debridement.  He is also on blood thinner, Eliquis.  Medications reviewed in chart.  Allergies  Allergen Reactions  . Ramipril Other (See Comments)    cough  . Penicillins Itching and Rash    Has patient had a PCN reaction causing immediate rash, facial/tongue/throat swelling, SOB or lightheadedness with hypotension: No Has patient had a PCN reaction causing severe rash involving mucus membranes or skin necrosis: Yes-back and hands. Has patient had a PCN reaction that required hospitalization: No Has patient had a PCN reaction occurring within the last 10 years: Yes If all of the above answers are "NO", then may proceed with Cephalosporin use.    Objective:  Vascular Examination: Capillary refill time to digits <3 seconds b/l.   Dorsalis pedis and posterior tibial pulses present b/l.  Digital hair absent b/l.   Skin temperature gradient WNL b/l.   Dermatological Examination: Skin with normal turgor, texture and tone b/l.  Toenails 1-5 b/l discolored, thick, dystrophic with subungual debris and pain with palpation to nailbeds due to thickness of nails.  Incurvated nailplate b/l great toes with tenderness to palpation. No erythema, no edema, no drainage noted.  Musculoskeletal: Muscle strength 5/5 to all LE muscle groups b/l.  Hammertoes lesser digits b/l.   No pain, crepitus or joint limitation noted with ROM.   Neurological Examination: Protective sensation intact with 10 gram monofilament bilaterally.  Epicritic sensation present bilaterally.  Vibratory sensation intact bilaterally.   Assessment: Painful onychomycosis toenails 1-5 b/l in patient on long term blood thinner Ingrown toenails b/l great  toes  Plan: 1. Toenails 1-5 b/l were debrided in length and girth without iatrogenic bleeding. Offending nail border debrided and curretaged left hallux. Border cleansed with alcohol and triple antibiotic applied. No further treatment required by patient/caregiver.Discussed topical, laser and oral medication. Patient opted for topical treatment with compounded medication. Rx written for nonformulary compounding topical antifungal: [facility]: Antifungal cream - Terbinafine 3%, Fluconazole 2%, Tea Tree Oil 5%, Urea 10%, Ibuprofen 2% in DMSO Suspension #52ml. Apply to the affected nail(s) at bedtime. 2. Patient to continue soft, supportive shoe gear daily. 3. Patient to report any pedal injuries to medical professional immediately. 4. Follow up 9 weeks. 5. Patient/POA to call should there be a concern in the interim.

## 2019-11-01 ENCOUNTER — Encounter: Payer: Self-pay | Admitting: Podiatry

## 2019-11-14 ENCOUNTER — Ambulatory Visit: Payer: Medicare Other

## 2019-11-19 ENCOUNTER — Ambulatory Visit: Payer: Medicare Other

## 2019-11-23 ENCOUNTER — Other Ambulatory Visit (HOSPITAL_COMMUNITY): Payer: Self-pay | Admitting: Internal Medicine

## 2019-12-10 ENCOUNTER — Ambulatory Visit: Payer: Medicare Other | Attending: Internal Medicine

## 2019-12-10 DIAGNOSIS — Z23 Encounter for immunization: Secondary | ICD-10-CM

## 2019-12-10 NOTE — Progress Notes (Signed)
   Covid-19 Vaccination Clinic  Name:  [name]    MRN: [id number] DOB: 1932-08-17  12/10/2019  [name] was observed post Covid-19 immunization for 15 minutes without incidence. He was provided with Vaccine Information Sheet and instruction to access the V-Safe system.   [name] was instructed to call 911 with any severe reactions post vaccine: Marland Kitchen Difficulty breathing  . Swelling of your face and throat  . A fast heartbeat  . A bad rash all over your body  . Dizziness and weakness    Immunizations Administered    Name Date Dose VIS Date Route   Pfizer COVID-19 Vaccine 12/10/2019 12:02 PM 0.3 mL 09/24/2019 Intramuscular   Manufacturer: [place]   Lot: [id number]   [place]: [id number]

## 2019-12-31 ENCOUNTER — Encounter: Payer: Self-pay | Admitting: Podiatry

## 2019-12-31 ENCOUNTER — Ambulatory Visit (INDEPENDENT_AMBULATORY_CARE_PROVIDER_SITE_OTHER): Payer: Medicare Other | Admitting: Podiatry

## 2019-12-31 ENCOUNTER — Other Ambulatory Visit: Payer: Self-pay

## 2019-12-31 VITALS — Temp 96.5°F

## 2019-12-31 DIAGNOSIS — M79674 Pain in right toe(s): Secondary | ICD-10-CM | POA: Diagnosis not present

## 2019-12-31 DIAGNOSIS — M79675 Pain in left toe(s): Secondary | ICD-10-CM | POA: Diagnosis not present

## 2019-12-31 DIAGNOSIS — B351 Tinea unguium: Secondary | ICD-10-CM

## 2019-12-31 DIAGNOSIS — Z9229 Personal history of other drug therapy: Secondary | ICD-10-CM

## 2019-12-31 NOTE — Patient Instructions (Signed)

## 2019-12-31 NOTE — Progress Notes (Signed)
Subjective: [name] presents today for follow up of painful mycotic nails b/l that are difficult to trim. Pain interferes with ambulation. Aggravating factors include wearing enclosed shoe gear. Pain is relieved with periodic professional debridement and ingrown toenail to the left hallux medial border.   His wife is present during today's visit.  He states he didn't get Rx filled for toenails from [facility] due to out of pocket cost.  He voices no new pedal concerns on today's visit.  He remains on blood thinner, Eliquis.  Allergies  Allergen Reactions  . Ramipril Other (See Comments)    cough  . Penicillins Itching and Rash    Has patient had a PCN reaction causing immediate rash, facial/tongue/throat swelling, SOB or lightheadedness with hypotension: No Has patient had a PCN reaction causing severe rash involving mucus membranes or skin necrosis: Yes-back and hands. Has patient had a PCN reaction that required hospitalization: No Has patient had a PCN reaction occurring within the last 10 years: Yes If all of the above answers are "NO", then may proceed with Cephalosporin use.     Objective: Vitals:   12/31/19 1313  Temp: (!) 96.5 F (35.8 C)    Pt 86 y.o. year old Caucasian male  in NAD. AAO x 3.   Vascular Examination:  Capillary fill time to digits <3 seconds b/l. Palpable DP pulses b/l. Palpable PT pulses b/l. Pedal hair absent b/l Skin temperature gradient within normal limits b/l.  Dermatological Examination: Pedal skin with normal turgor, texture and tone bilaterally. No open wounds bilaterally. No interdigital macerations bilaterally. Toenails 1-5 b/l elongated, dystrophic, thickened, crumbly with subungual debris and tenderness to dorsal palpation. Pedal skin noted to be dry and flaky b/l.  Musculoskeletal: Normal muscle strength 5/5 to all lower extremity muscle groups bilaterally, no pain crepitus or joint limitation noted with ROM b/l and  bunion deformity noted b/l  Neurological: Protective sensation intact 5/5 intact bilaterally with 10g monofilament b/l Vibratory sensation intact b/l  Assessment: 1. Pain due to onychomycosis of toenails of both feet   2. Hx of long term use of blood thinners    Plan: -Toenails 1-5 b/l were debrided in length and girth with sterile nail nippers and dremel without iatrogenic bleeding.  -Patient to continue soft, supportive shoe gear daily. -Patient to report any pedal injuries to medical professional immediately. -For dry skin, patient was instructed to apply moisturizing lotion followed by Vaseline Petroleum Jelly once daily avoiding application between toes.  -Patient/POA to call should there be question/concern in the interim.  Return in about 9 weeks (around 03/03/2020).

## 2020-01-04 ENCOUNTER — Ambulatory Visit: Payer: Medicare Other | Attending: Internal Medicine

## 2020-01-04 DIAGNOSIS — Z23 Encounter for immunization: Secondary | ICD-10-CM

## 2020-01-04 NOTE — Progress Notes (Signed)
   Covid-19 Vaccination Clinic  Name:  [name]    MRN: [id number] DOB: 09-20-1932  01/04/2020  [name] was observed post Covid-19 immunization for 15 minutes without incident. He was provided with Vaccine Information Sheet and instruction to access the V-Safe system.   [name] was instructed to call 911 with any severe reactions post vaccine: Marland Kitchen Difficulty breathing  . Swelling of face and throat  . A fast heartbeat  . A bad rash all over body  . Dizziness and weakness   Immunizations Administered    Name Date Dose VIS Date Route   Pfizer COVID-19 Vaccine 01/04/2020  2:17 PM 0.3 mL 09/24/2019 Intramuscular   Manufacturer: [place]   Lot: [id number]   [place]: [id number]

## 2020-01-19 ENCOUNTER — Ambulatory Visit (INDEPENDENT_AMBULATORY_CARE_PROVIDER_SITE_OTHER): Payer: Medicare Other | Admitting: *Deleted

## 2020-01-19 DIAGNOSIS — I5022 Chronic systolic (congestive) heart failure: Secondary | ICD-10-CM | POA: Diagnosis not present

## 2020-01-19 LAB — CUP PACEART REMOTE DEVICE CHECK
Battery Remaining Longevity: 13 mo
Battery Remaining Percentage: 18 %
Battery Voltage: 2.75 V
Brady Statistic AP VP Percent: 97 %
Brady Statistic AP VS Percent: 1 %
Brady Statistic AS VP Percent: 2.6 %
Brady Statistic AS VS Percent: 1 %
Brady Statistic RA Percent Paced: 85 %
Date Time Interrogation Session: 20210407020015
HighPow Impedance: 53 Ohm
HighPow Impedance: 53 Ohm
Implantable Lead Implant Date: 20060509
Implantable Lead Implant Date: 20060509
Implantable Lead Implant Date: 20090417
Implantable Lead Location: 753858
Implantable Lead Location: 753859
Implantable Lead Location: 753860
Implantable Lead Model: 5076
Implantable Lead Model: 7001
Implantable Pulse Generator Implant Date: 20141208
Lead Channel Impedance Value: 400 Ohm
Lead Channel Impedance Value: 440 Ohm
Lead Channel Impedance Value: 480 Ohm
Lead Channel Pacing Threshold Amplitude: 0.75 V
Lead Channel Pacing Threshold Amplitude: 0.875 V
Lead Channel Pacing Threshold Amplitude: 1.125 V
Lead Channel Pacing Threshold Pulse Width: 0.5 ms
Lead Channel Pacing Threshold Pulse Width: 0.8 ms
Lead Channel Pacing Threshold Pulse Width: 0.8 ms
Lead Channel Sensing Intrinsic Amplitude: 12 mV
Lead Channel Sensing Intrinsic Amplitude: 3 mV
Lead Channel Setting Pacing Amplitude: 2 V
Lead Channel Setting Pacing Amplitude: 2 V
Lead Channel Setting Pacing Amplitude: 2.125
Lead Channel Setting Pacing Pulse Width: 0.8 ms
Lead Channel Setting Pacing Pulse Width: 0.8 ms
Lead Channel Setting Sensing Sensitivity: 0.5 mV
Pulse Gen Serial Number: 7138995

## 2020-01-19 NOTE — Progress Notes (Signed)
ICD Remote  

## 2020-02-02 ENCOUNTER — Other Ambulatory Visit: Payer: Self-pay

## 2020-02-02 ENCOUNTER — Ambulatory Visit (INDEPENDENT_AMBULATORY_CARE_PROVIDER_SITE_OTHER): Payer: Medicare Other | Admitting: Otolaryngology

## 2020-02-02 VITALS — Temp 97.3°F

## 2020-02-02 DIAGNOSIS — H6522 Chronic serous otitis media, left ear: Secondary | ICD-10-CM

## 2020-02-02 NOTE — Progress Notes (Signed)
HPI: [name] is a 86 y.o. male who returns today for evaluation of chronic left serous otitis media.  Patient has had a long history of ear problems especially on the left side.  He had a previous left ear T-tube placed 6 years ago.  This is now extruded and he has bubbling sound and popping in the left ear..  Past Medical History:  Diagnosis Date  . Acute bronchitis with COPD ([place]) 09/10/2016  . Anemia   . Atrial fibrillation or flutter    maintaining sinus on amiodarone    . Bladder cancer ([place]) dx'd 05/2012  . BPH (benign prostatic hyperplasia)   . CAD (coronary artery disease)     a. s/p anterior MI 12/05 c/b shock -> stent LAD   b. s/p stenting OM-1, 2/06  . CHF (congestive heart failure) (HCC)    due to ischemic CM  a. EF 20-30%. (Nov 2008)   b. s/p St. Jude BiV-ICD    c. CPX 07/2008  pvo2 16.3 (63% predicted) slope 34 RER 1.08 O2 pulse 93%  . Cough    occasional /productive, no fever/ not new  . CRI (chronic renal insufficiency)    (baseline 2.0-2.2)/ recent hospitalization 8/13  . GERD (gastroesophageal reflux disease)    hx Barretts esophagitis  . History of blood transfusion    following coumadin  usage  . HTN (hypertension)    EKG 05/14/12,Chest x ray 8/13, last ICD interrogation 8/13 EPIC  . Hyperlipidemia   . Hypothyroidism   . 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 Skin cancer    basal cells   facial x 4, 1 right forearm  . Sleep apnea    STOP BANG SCORE 4   Past Surgical History:  Procedure Laterality Date  . [place]   lower back  . CARDIAC DEFIBRILLATOR PLACEMENT     ICD St Jude; gen change 09-20-13  . CERVICAL LAMINECTOMY  1985  . COLONOSCOPY    . CORONARY ANGIOPLASTY  2005,2006  . CYSTOSCOPY W/ RETROGRADES  05/25/2012   Procedure: CYSTOSCOPY WITH RETROGRADE PYELOGRAM;  Surgeon: Molli Hazard, MD;  Location:  [facility];  Service: Urology;  Laterality: Bilateral;  . CYSTOSCOPY W/ URETERAL STENT PLACEMENT  07/08/2012   Procedure: CYSTOSCOPY WITH STENT REPLACEMENT;  Surgeon: Molli Hazard, MD;  Location: [facility];  Service: Urology;  Laterality: Left;  . CYSTOSCOPY W/ URETERAL STENT REMOVAL  07/08/2012   Procedure: CYSTOSCOPY WITH STENT REMOVAL;  Surgeon: Molli Hazard, MD;  Location: [facility];  Service: Urology;  Laterality: Bilateral;  . CYSTOSCOPY/RETROGRADE/URETEROSCOPY  07/08/2012   Procedure: CYSTOSCOPY/RETROGRADE/URETEROSCOPY;  Surgeon: Molli Hazard, MD;  Location: [facility];  Service: Urology;  Laterality: Left;  . ESOPHAGOGASTRODUODENOSCOPY    . HERNIA REPAIR  1989   bilateral  . IMPLANTABLE CARDIOVERTER DEFIBRILLATOR (ICD) GENERATOR CHANGE N/A 09/20/2013   Procedure: ICD GENERATOR CHANGE;  Surgeon: Deboraha Sprang, MD;  Location: [facility] CATH LAB;  Service: Cardiovascular;  Laterality: N/A;  . TEE WITHOUT CARDIOVERSION N/A 12/10/2017   Procedure: TRANSESOPHAGEAL ECHOCARDIOGRAM (TEE);  Surgeon: Jerline Pain, MD;  Location: [facility] ENDOSCOPY;  Service: Cardiovascular;  Laterality: N/A;  . TRANSURETHRAL RESECTION OF BLADDER TUMOR  05/25/2012   cold cup biopsy prostate  . TRANSURETHRAL RESECTION OF BLADDER TUMOR  07/08/2012   Procedure: TRANSURETHRAL RESECTION OF BLADDER TUMOR (TURBT);  Surgeon: Quillian Quince  Julieanne Cotton, MD;  Location: [facility];  Service: Urology;  Laterality: N/A;  CYSTO, TURBT W/ GYRUS, BILATERAL STENT REMOVAL, LEFT URETEROSCOPY WITH BIOPSY, POSSIBLE LEFT STENT PLACEMENT    Social History   Socioeconomic History  . Marital status: Married    Spouse name: Not on file  . Number of children: Not on file  . Years of education: Not on file  . Highest education level: Not on file  Occupational History  . Occupation: retired  Tobacco Use  . Smoking status: Former Smoker    Types: Cigarettes, Cigars    Quit date: 03/02/2004    Years since quitting: 15.9  . Smokeless tobacco:  Former Systems developer    Quit date: 05/20/1999  . Tobacco comment: quit in 2005  Substance and Sexual Activity  . Alcohol use: No  . Drug use: No  . Sexual activity: Not on file  Other Topics Concern  . Not on file  Social History Narrative  . Not on file   Social Determinants of Health   Financial Resource Strain:   . Difficulty of Paying Living Expenses:   Food Insecurity:   . Worried About Charity fundraiser in the Last Year:   . Arboriculturist in the Last Year:   Transportation Needs:   . Film/video editor (Medical):   Marland Kitchen Lack of Transportation (Non-Medical):   Physical Activity:   . Days of Exercise per Week:   . Minutes of Exercise per Session:   Stress:   . Feeling of Stress :   Social Connections:   . Frequency of Communication with Friends and Family:   . Frequency of Social Gatherings with Friends and Family:   . Attends Religious Services:   . Active Member of Clubs or Organizations:   . Attends Archivist Meetings:   Marland Kitchen Marital Status:    Family History  Problem Relation Age of Onset  . Coronary artery disease Other   . Stroke Other    Allergies  Allergen Reactions  . Ramipril Other (See Comments)    cough  . Penicillins Itching and Rash    Has patient had a PCN reaction causing immediate rash, facial/tongue/throat swelling, SOB or lightheadedness with hypotension: No Has patient had a PCN reaction causing severe rash involving mucus membranes or skin necrosis: Yes-back and hands. Has patient had a PCN reaction that required hospitalization: No Has patient had a PCN reaction occurring within the last 10 years: Yes If all of the above answers are "NO", then may proceed with Cephalosporin use.   Prior to Admission medications   Medication Sig Start Date End Date Taking? Authorizing Provider  amiodarone (PACERONE) 200 MG tablet Take 0.5 tablets (100 mg total) by mouth daily. 08/05/19  Yes Bensimhon, Shaune Pascal, MD  carvedilol (COREG) 6.25 MG tablet TAKE  1 TABLET (6.25MG ) BY MOUTH TWICE DAILY WITH A MEAL 11/23/19  Yes Bensimhon, Shaune Pascal, MD  ELIQUIS 2.5 MG TABS tablet TAKE 1 TABLET BY MOUTH TWICE A DAY 10/25/19  Yes Bensimhon, Shaune Pascal, MD  ezetimibe (ZETIA) 10 MG tablet TAKE ONE TABLET BY MOUTH ONCE DAILY WITH SUPPER 03/06/17  Yes Larey Dresser, MD  ferrous sulfate ([facility] IRON) 325 (65 FE) MG tablet Take 325 mg by mouth daily with supper.    Yes [provider]  furosemide (LASIX) 20 MG tablet Take 2 tablets (40 mg total) by mouth daily. 12/11/17  Yes Bensimhon, Shaune Pascal, MD  levothyroxine (SYNTHROID, LEVOTHROID) 50 MCG tablet Take  25-50 mcg by mouth daily before breakfast. Take 50 mcg (1 tablet) on Tues, Thurs, Sat, Sun and 25 mcg (1/2 tablet) on Mon, Wed, Fri   Yes [provider]  Multiple Vitamins-Minerals (CENTRUM PO) Take 1 tablet by mouth daily.   Yes [provider]  nitroGLYCERIN (NITROSTAT) 0.4 MG SL tablet DISSOLVE ONE TABLET UNDER THE TONGUE EVERY 5 MINUTES AS NEEDED FOR CHEST PAIN.  DO NOT EXCEED A TOTAL OF 3 DOSES IN 15 MINUTES 12/26/16  Yes Larey Dresser, MD  NONFORMULARY OR COMPOUNDED ITEM Apply to the affected nail(s) once (at bedtime) Terbinafine3%,Fluconazole 2%,Tea tree oil5%,Urea 10%,Ibuprofren 2 %, in DMSO suspension #22mL 10/29/19  Yes G[place]way, Stephani Police, DPM  pantoprazole (PROTONIX) 40 MG tablet Take 40 mg by mouth daily.  01/15/11  Yes [provider]  potassium chloride (K-DUR,KLOR-CON) 10 MEQ tablet Take 1 tablet (10 mEq total) by mouth daily. 12/13/17  Yes Velvet Bathe, MD  rosuvastatin (CRESTOR) 20 MG tablet Take 1 tablet (20 mg total) by mouth daily. 10/18/15  Yes Bensimhon, Shaune Pascal, MD  tamsulosin (FLOMAX) 0.4 MG CAPS capsule Take 0.4 mg by mouth at bedtime.  11/23/13  Yes [provider]     Positive ROS: Otherwise negative  All other systems have been reviewed and were otherwise negative with the exception of those mentioned in the HPI and as above.  Physical  Exam: Constitutional: Alert, well-appearing, no acute distress Ears: External ears without lesions or tenderness. Ear canals are clear bilaterally.  Right TM is slightly retracted but clear otherwise with no middle ear effusion.  Left ear reveals a retracted anterior monomeric TM with a yellow serous middle ear effusion.  Because of recurrent serous otitis and history of T tubes in the past proceeded with left myringotomy tube placed in the office today.  Phenol was applied to the left anterior TM.  Myringotomy was performed in a serous effusion was aspirated.  A modified T-tube was inserted without difficulty followed by Floxin eardrops. Nasal: External nose without lesions. Clear nasal passages Oral: Lips and gums without lesions. Tongue and palate mucosa without lesions. Posterior oropharynx clear. Neck: No palpable adenopathy or masses Respiratory: Breathing comfortably  Skin: No facial/neck lesions or rash noted.  Myringotomy  Date/Time: 02/02/2020 12:58 PM Performed by: Rozetta Nunnery, MD Authorized by: Rozetta Nunnery, MD   Consent:    Consent obtained:  Verbal   Consent given by:  Patient   Risks discussed:  Bleeding and pain   Alternatives discussed:  No treatment Pre-procedure details:    Indications: serous otitis media   Anesthesia:    Anesthesia method:  Topical application   Topical anesthetic:  Phenol Procedure Details:    Location:  Left TM   Hole made in:  Anteroinferior aspect of TM   Tube:  T-tube Findings:    Fluid:  Serous fluid Post-procedure details:    Patient tolerance of procedure:  Tolerated well, no immediate complications Comments:     After placement of the T-tube applied Floxin eardrops.    Assessment: Chronic left serous otitis media  Plan: Performed a left M&T with a T-tube in the office today. He will follow-up in 2 weeks for recheck.   Radene Journey, MD

## 2020-02-12 ENCOUNTER — Other Ambulatory Visit (HOSPITAL_COMMUNITY): Payer: Self-pay | Admitting: Internal Medicine

## 2020-02-14 ENCOUNTER — Encounter: Payer: Self-pay | Admitting: Internal Medicine

## 2020-02-14 ENCOUNTER — Ambulatory Visit (INDEPENDENT_AMBULATORY_CARE_PROVIDER_SITE_OTHER): Payer: Medicare Other | Admitting: Internal Medicine

## 2020-02-14 ENCOUNTER — Other Ambulatory Visit: Payer: Self-pay

## 2020-02-14 ENCOUNTER — Other Ambulatory Visit (HOSPITAL_COMMUNITY): Payer: Self-pay | Admitting: *Deleted

## 2020-02-14 VITALS — BP 118/60 | HR 60 | Ht 70.0 in | Wt 189.0 lb

## 2020-02-14 DIAGNOSIS — E039 Hypothyroidism, unspecified: Secondary | ICD-10-CM | POA: Diagnosis not present

## 2020-02-14 DIAGNOSIS — Z79899 Other long term (current) drug therapy: Secondary | ICD-10-CM

## 2020-02-14 DIAGNOSIS — I480 Paroxysmal atrial fibrillation: Secondary | ICD-10-CM

## 2020-02-14 DIAGNOSIS — Z95810 Presence of automatic (implantable) cardiac defibrillator: Secondary | ICD-10-CM

## 2020-02-14 DIAGNOSIS — I255 Ischemic cardiomyopathy: Secondary | ICD-10-CM | POA: Diagnosis not present

## 2020-02-14 MED ORDER — CARVEDILOL 6.25 MG PO TABS: 6.2500 mg | ORAL_TABLET | Freq: Two times a day (BID) | ORAL | 3 refills | Status: DC

## 2020-02-14 NOTE — Progress Notes (Signed)
Patient Care Team: [name], MD as PCP - General (Internal Medicine) Haroldine Laws, Shaune Pascal, MD as Consulting Physician (Cardiology)   HPI  [name] is a 86 y.o. male seen in followup for ischemic heart myopathy congestive heart failure and prior CRT-D with generator replaced 12/14   We  reprogrammed his device at the last visit to exclude the proximal ring on the LV lead. He has been doing much better. He has had no significant shortness of breath or chest pain.  He has had no palpitations. He has had recurrent  atrial fibrillation. Managed with amiodarone -- had problems with neurotoxicity with gait instability and his amiodarone  decreased>>and then stopped 2/2 tremor  Resumed 2020 for recurrent afib   Continues to complain of fatigue mild shortness of breath.  Relieved rapidly with rest.  Some increase in peripheral edema.  No chest discomfort.  No syncope.  Patient denies symptoms of GI intolerance, sun sensitivity, neurological symptoms attributable to amiodarone.      Date Cr K Hgb TSH LFT  4/17 2.5        6/19    1.22 103  2/20 2.49 3.6 13.1    11/20 2.2  12.6 1.39 10    Echo 12/14 LVEF 20-25% Carotid u/s 3/14: 40-59% R 0-39%%L Echo 11/2017: EF 25%     Past Medical History:  Diagnosis Date  . Acute bronchitis with COPD ([place]) 09/10/2016  . Anemia   . Atrial fibrillation or flutter    maintaining sinus on amiodarone    . Bladder cancer ([place]) dx'd 05/2012  . BPH (benign prostatic hyperplasia)   . CAD (coronary artery disease)     a. s/p anterior MI 12/05 c/b shock -> stent LAD   b. s/p stenting OM-1, 2/06  . CHF (congestive heart failure) (HCC)    due to ischemic CM  a. EF 20-30%. (Nov 2008)   b. s/p St. Jude BiV-ICD    c. CPX 07/2008  pvo2 16.3 (63% predicted) slope 34 RER 1.08 O2 pulse 93%  . Cough    occasional /productive, no fever/ not new  . CRI (chronic renal insufficiency)    (baseline 2.0-2.2)/ recent hospitalization 8/13  . GERD  (gastroesophageal reflux disease)    hx Barretts esophagitis  . History of blood transfusion    following coumadin  usage  . HTN (hypertension)    EKG 05/14/12,Chest x ray 8/13, last ICD interrogation 8/13 EPIC  . Hyperlipidemia   . Hypothyroidism   . 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 Skin cancer    basal cells   facial x 4, 1 right forearm  . Sleep apnea    STOP BANG SCORE 4    Past Surgical History:  Procedure Laterality Date  . [place]   lower back  . CARDIAC DEFIBRILLATOR PLACEMENT     ICD St Jude; gen change 09-20-13  . CERVICAL LAMINECTOMY  1985  . COLONOSCOPY    . CORONARY ANGIOPLASTY  2005,2006  . CYSTOSCOPY W/ RETROGRADES  05/25/2012   Procedure: CYSTOSCOPY WITH RETROGRADE PYELOGRAM;  Surgeon: Molli Hazard, MD;  Location: [facility];  Service: Urology;  Laterality: Bilateral;  . CYSTOSCOPY W/ URETERAL STENT PLACEMENT  07/08/2012   Procedure: CYSTOSCOPY WITH STENT REPLACEMENT;  Surgeon: Molli Hazard, MD;  Location: [facility];  Service: Urology;  Laterality: Left;  . CYSTOSCOPY W/ URETERAL STENT REMOVAL  07/08/2012   Procedure: CYSTOSCOPY WITH STENT REMOVAL;  Surgeon: Molli Hazard, MD;  Location: [facility];  Service: Urology;  Laterality: Bilateral;  . CYSTOSCOPY/RETROGRADE/URETEROSCOPY  07/08/2012   Procedure: CYSTOSCOPY/RETROGRADE/URETEROSCOPY;  Surgeon: Molli Hazard, MD;  Location: [facility];  Service: Urology;  Laterality: Left;  . ESOPHAGOGASTRODUODENOSCOPY    . HERNIA REPAIR  1989   bilateral  . IMPLANTABLE CARDIOVERTER DEFIBRILLATOR (ICD) GENERATOR CHANGE N/A 09/20/2013   Procedure: ICD GENERATOR CHANGE;  Surgeon: Deboraha Sprang, MD;  Location: [facility] CATH LAB;  Service: Cardiovascular;  Laterality: N/A;  . TEE WITHOUT CARDIOVERSION N/A 12/10/2017   Procedure: TRANSESOPHAGEAL ECHOCARDIOGRAM (TEE);  Surgeon: Jerline Pain, MD;  Location: [facility] ENDOSCOPY;  Service: Cardiovascular;  Laterality: N/A;  . TRANSURETHRAL RESECTION OF BLADDER TUMOR  05/25/2012   cold cup biopsy prostate  . TRANSURETHRAL RESECTION OF BLADDER TUMOR  07/08/2012   Procedure: TRANSURETHRAL RESECTION OF BLADDER TUMOR (TURBT);  Surgeon: Molli Hazard, MD;  Location: [facility];  Service: Urology;  Laterality: N/A;  CYSTO, TURBT W/ GYRUS, BILATERAL STENT REMOVAL, LEFT URETEROSCOPY WITH BIOPSY, POSSIBLE LEFT STENT PLACEMENT     Current Outpatient Medications  Medication Sig Dispense Refill  . amiodarone (PACERONE) 200 MG tablet Take 0.5 tablets (100 mg total) by mouth daily. 30 tablet 11  . carvedilol (COREG) 6.25 MG tablet Take 1 tablet (6.25 mg total) by mouth 2 (two) times daily with a meal. 60 tablet 3  . ELIQUIS 2.5 MG TABS tablet TAKE 1 TABLET BY MOUTH TWICE A DAY 60 tablet 5  . ezetimibe (ZETIA) 10 MG tablet TAKE ONE TABLET BY MOUTH ONCE DAILY WITH SUPPER 90 tablet 3  . ferrous sulfate ([employer] IRON) 325 (65 FE) MG tablet Take 325 mg by mouth daily with supper.     . furosemide (LASIX) 20 MG tablet Take 2 tablets (40 mg total) by mouth daily. 180 tablet 2  . levothyroxine (SYNTHROID, LEVOTHROID) 50 MCG tablet Take 25-50 mcg by mouth daily before breakfast. Take 50 mcg (1 tablet) on Tues, Thurs, Sat, Sun and 25 mcg (1/2 tablet) on Mon, Wed, Fri    . Multiple Vitamins-Minerals (CENTRUM PO) Take 1 tablet by mouth daily.    . nitroGLYCERIN (NITROSTAT) 0.4 MG SL tablet DISSOLVE ONE TABLET UNDER THE TONGUE EVERY 5 MINUTES AS NEEDED FOR CHEST PAIN.  DO NOT EXCEED A TOTAL OF 3 DOSES IN 15 MINUTES 25 tablet 0  . NONFORMULARY OR COMPOUNDED ITEM Apply to the affected nail(s) once (at bedtime) Terbinafine3%,Fluconazole 2%,Tea tree oil5%,Urea 10%,Ibuprofren 2 %, in DMSO suspension #66mL 1 each 0  . pantoprazole (PROTONIX) 40 MG tablet Take 40 mg by mouth daily.     . potassium chloride (K-DUR,KLOR-CON) 10 MEQ tablet Take 1 tablet (10 mEq total) by  mouth daily. 30 tablet 0  . rosuvastatin (CRESTOR) 20 MG tablet Take 1 tablet (20 mg total) by mouth daily. 90 tablet 3  . tamsulosin (FLOMAX) 0.4 MG CAPS capsule Take 0.4 mg by mouth at bedtime.      No current facility-administered medications for this visit.    Allergies  Allergen Reactions  . Ramipril Other (See Comments)    cough  . Penicillins Itching and Rash    Has patient had a PCN reaction causing immediate rash, facial/tongue/throat swelling, SOB or lightheadedness with hypotension: No Has patient had a PCN reaction causing severe rash involving mucus membranes or skin necrosis: Yes-back and hands. Has patient had a PCN  reaction that required hospitalization: No Has patient had a PCN reaction occurring within the last 10 years: Yes If all of the above answers are "NO", then may proceed with Cephalosporin use.    Review of Systems negative except from HPI and PMH  Physical Exam BP 118/60   Pulse 60   Ht 5\' 10"  (1.778 m)   Wt 189 lb (85.7 kg)   SpO2 98%   BMI 27.12 kg/m  Well developed and well nourished in no acute distress HENT normal Neck supple with JVP 6 Clear Device pocket well healed; without hematoma or erythema.  There is no tethering  Regular rate and rhythm, no murmur Abd-soft with active BS No Clubbing cyanosis 1+edema Skin-warm and dry A & Oriented  Grossly normal sensory and motor function  ECG AV pacing neg QRS1 upright QRS V1   Assessment and  Plan  Ischemic cardiomyopathy  CRT-d  St Jude The patient's device was interrogated.  The information was reviewed. No changes were made in the programming.     Atrial fibrillation   High Risk Medication Surveillance-amiodarone  Heart failure-chronic systolic  Renal insufficiency grade 3-4    Mild volume overload.  We will increase his furosemide from 40--80 daily x3 days.  Without symptoms of ischemia  Intermittent atrial fibrillation.  We will continue the amiodarone which he appears to be  tolerating.  Tremor tolerable  Medication wise I do not think there is a lot that we can do for his cardiomyopathy given his renal insufficiency.  On Anticoagulation;  No bleeding issues

## 2020-02-14 NOTE — Patient Instructions (Addendum)
Medication Instructions:  Your physician recommends that you continue on your current medications as directed. Please refer to the Current Medication list given to you today.  Increase your Furosemide to 80mg  (4 tablets by mouth daily x 3 days) then return to 40mg  daily (2 tablets by mouth daily)    Labwork: You will have labs drawn today: CMET, CBC and TSH  Testing/Procedures: None ordered.  Follow-Up: Your physician wants you to follow-up in: 6 months with Dr Gari Crown will receive a reminder letter in the mail two months in advance. If you don't receive a letter, please call our office to schedule the follow-up appointment.  Remote monitoring is used to monitor your Pacemaker of ICD from home. This monitoring reduces the number of office visits required to check your device to one time per year. It allows [place] to keep an eye on the functioning of your device to ensure it is working properly.  Any Other Special Instructions Will Be Listed Below (If Applicable).  If you need a refill on your cardiac medications before your next appointment, please call your pharmacy.

## 2020-02-15 ENCOUNTER — Telehealth: Payer: Self-pay

## 2020-02-15 DIAGNOSIS — E039 Hypothyroidism, unspecified: Secondary | ICD-10-CM

## 2020-02-15 LAB — CBC
Hematocrit: 39.4 % (ref 37.5–51.0)
Hemoglobin: 12.8 g/dL — ABNORMAL LOW (ref 13.0–17.7)
MCH: 30.7 pg (ref 26.6–33.0)
MCHC: 32.5 g/dL (ref 31.5–35.7)
MCV: 95 fL (ref 79–97)
Platelets: 201 10*3/uL (ref 150–450)
RBC: 4.17 x10E6/uL (ref 4.14–5.80)
RDW: 12.6 % (ref 11.6–15.4)
WBC: 7.5 10*3/uL (ref 3.4–10.8)

## 2020-02-15 LAB — COMPREHENSIVE METABOLIC PANEL
ALT: 10 IU/L (ref 0–44)
AST: 20 IU/L (ref 0–40)
Albumin/Globulin Ratio: 1.6 (ref 1.2–2.2)
Albumin: 4.2 g/dL (ref 3.6–4.6)
Alkaline Phosphatase: 72 IU/L (ref 39–117)
BUN/Creatinine Ratio: 13 (ref 10–24)
BUN: 31 mg/dL — ABNORMAL HIGH (ref 8–27)
Bilirubin Total: 0.5 mg/dL (ref 0.0–1.2)
CO2: 24 mmol/L (ref 20–29)
Calcium: 8.8 mg/dL (ref 8.6–10.2)
Chloride: 103 mmol/L (ref 96–106)
Creatinine, Ser: 2.31 mg/dL — ABNORMAL HIGH (ref 0.76–1.27)
GFR calc Af Amer: 29 mL/min/{1.73_m2} — ABNORMAL LOW (ref >59)
GFR calc non Af Amer: 25 mL/min/{1.73_m2} — ABNORMAL LOW (ref >59)
Globulin, Total: 2.6 g/dL (ref 1.5–4.5)
Glucose: 88 mg/dL (ref 65–99)
Potassium: 4 mmol/L (ref 3.5–5.2)
Sodium: 141 mmol/L (ref 134–144)
Total Protein: 6.8 g/dL (ref 6.0–8.5)

## 2020-02-15 LAB — TSH: TSH: 6.68 u[IU]/mL — ABNORMAL HIGH (ref 0.450–4.500)

## 2020-02-15 MED ORDER — LEVOTHYROXINE SODIUM 75 MCG PO TABS
75.0000 ug | ORAL_TABLET | Freq: Every day | ORAL | 11 refills | Status: DC
Start: 2020-02-15 — End: 2021-02-22

## 2020-02-15 NOTE — Telephone Encounter (Signed)
Spoke with pt and advised per Dr Caryl Comes increase Levothyroxine to 64mcg 1 -  tablet qam and repeat TSH in 1 month.  New Rx sent to pharmacy and lab appointment scheduled for 03/20/2020.  Pt verbalizes understanding and agrees with current plan.

## 2020-02-16 ENCOUNTER — Other Ambulatory Visit: Payer: Self-pay

## 2020-02-16 ENCOUNTER — Ambulatory Visit (INDEPENDENT_AMBULATORY_CARE_PROVIDER_SITE_OTHER): Payer: Medicare Other | Admitting: Otolaryngology

## 2020-02-16 VITALS — Temp 97.3°F

## 2020-02-16 DIAGNOSIS — Z4889 Encounter for other specified surgical aftercare: Secondary | ICD-10-CM

## 2020-02-16 NOTE — Progress Notes (Signed)
HPI: [name] is a 86 y.o. male who presents 14 days s/p placement of left TM T-tube.  He is doing well with no drainage but does complain of occasional popping in the left ear..   Past Medical History:  Diagnosis Date  . Acute bronchitis with COPD ([place]) 09/10/2016  . Anemia   . Atrial fibrillation or flutter    maintaining sinus on amiodarone    . Bladder cancer ([place]) dx'd 05/2012  . BPH (benign prostatic hyperplasia)   . CAD (coronary artery disease)     a. s/p anterior MI 12/05 c/b shock -> stent LAD   b. s/p stenting OM-1, 2/06  . CHF (congestive heart failure) (HCC)    due to ischemic CM  a. EF 20-30%. (Nov 2008)   b. s/p St. Jude BiV-ICD    c. CPX 07/2008  pvo2 16.3 (63% predicted) slope 34 RER 1.08 O2 pulse 93%  . Cough    occasional /productive, no fever/ not new  . CRI (chronic renal insufficiency)    (baseline 2.0-2.2)/ recent hospitalization 8/13  . GERD (gastroesophageal reflux disease)    hx Barretts esophagitis  . History of blood transfusion    following coumadin  usage  . HTN (hypertension)    EKG 05/14/12,Chest x ray 8/13, last ICD interrogation 8/13 EPIC  . Hyperlipidemia   . Hypothyroidism   . 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 Neuromuscular disorder ([place])    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"   no neurologist  . Skin cancer    basal cells   facial x 4, 1 right forearm  . Sleep apnea    STOP BANG SCORE 4   Past Surgical History:  Procedure Laterality Date  . [place]   lower back  . CARDIAC DEFIBRILLATOR PLACEMENT     ICD St Jude; gen change 09-20-13  . CERVICAL LAMINECTOMY  1985  . COLONOSCOPY    . CORONARY ANGIOPLASTY  2005,2006  . CYSTOSCOPY W/ RETROGRADES  05/25/2012   Procedure: CYSTOSCOPY WITH RETROGRADE PYELOGRAM;  Surgeon: Molli Hazard, MD;  Location: [facility];  Service: Urology;  Laterality: Bilateral;  . CYSTOSCOPY W/ URETERAL STENT PLACEMENT  07/08/2012   Procedure: CYSTOSCOPY  WITH STENT REPLACEMENT;  Surgeon: Molli Hazard, MD;  Location: [facility];  Service: Urology;  Laterality: Left;  . CYSTOSCOPY W/ URETERAL STENT REMOVAL  07/08/2012   Procedure: CYSTOSCOPY WITH STENT REMOVAL;  Surgeon: Molli Hazard, MD;  Location: [facility];  Service: Urology;  Laterality: Bilateral;  . CYSTOSCOPY/RETROGRADE/URETEROSCOPY  07/08/2012   Procedure: CYSTOSCOPY/RETROGRADE/URETEROSCOPY;  Surgeon: Molli Hazard, MD;  Location: [facility];  Service: Urology;  Laterality: Left;  . ESOPHAGOGASTRODUODENOSCOPY    . HERNIA REPAIR  1989   bilateral  . IMPLANTABLE CARDIOVERTER DEFIBRILLATOR (ICD) GENERATOR CHANGE N/A 09/20/2013   Procedure: ICD GENERATOR CHANGE;  Surgeon: Deboraha Sprang, MD;  Location: [facility] CATH LAB;  Service: Cardiovascular;  Laterality: N/A;  . TEE WITHOUT CARDIOVERSION N/A 12/10/2017   Procedure: TRANSESOPHAGEAL ECHOCARDIOGRAM (TEE);  Surgeon: Jerline Pain, MD;  Location: [facility] ENDOSCOPY;  Service: Cardiovascular;  Laterality: N/A;  . TRANSURETHRAL RESECTION OF BLADDER TUMOR  05/25/2012   cold cup biopsy prostate  . TRANSURETHRAL RESECTION OF BLADDER TUMOR  07/08/2012   Procedure: TRANSURETHRAL RESECTION OF BLADDER TUMOR (TURBT);  Surgeon: Molli Hazard, MD;  Location: [facility];  Service: Urology;  Laterality: N/A;  CYSTO, TURBT W/ GYRUS, BILATERAL STENT REMOVAL, LEFT URETEROSCOPY WITH BIOPSY,  POSSIBLE LEFT STENT PLACEMENT    Social History   Socioeconomic History  . Marital status: Married    Spouse name: Not on file  . Number of children: Not on file  . Years of education: Not on file  . Highest education level: Not on file  Occupational History  . Occupation: retired  Tobacco Use  . Smoking status: Former Smoker    Types: Cigarettes, Cigars    Quit date: 03/02/2004    Years since quitting: 15.9  . Smokeless tobacco: Former Systems developer    Quit date: 05/20/1999  . Tobacco comment: quit in 2005  Substance and Sexual Activity  . Alcohol use: No  . Drug  use: No  . Sexual activity: Not on file  Other Topics Concern  . Not on file  Social History Narrative  . Not on file   Social Determinants of Health   Financial Resource Strain:   . Difficulty of Paying Living Expenses:   Food Insecurity:   . Worried About Charity fundraiser in the Last Year:   . Arboriculturist in the Last Year:   Transportation Needs:   . Film/video editor (Medical):   Marland Kitchen Lack of Transportation (Non-Medical):   Physical Activity:   . Days of Exercise per Week:   . Minutes of Exercise per Session:   Stress:   . Feeling of Stress :   Social Connections:   . Frequency of Communication with Friends and Family:   . Frequency of Social Gatherings with Friends and Family:   . Attends Religious Services:   . Active Member of Clubs or Organizations:   . Attends Archivist Meetings:   Marland Kitchen Marital Status:    Family History  Problem Relation Age of Onset  . Coronary artery disease Other   . Stroke Other    Allergies  Allergen Reactions  . Ramipril Other (See Comments)    cough  . Penicillins Itching and Rash    Has patient had a PCN reaction causing immediate rash, facial/tongue/throat swelling, SOB or lightheadedness with hypotension: No Has patient had a PCN reaction causing severe rash involving mucus membranes or skin necrosis: Yes-back and hands. Has patient had a PCN reaction that required hospitalization: No Has patient had a PCN reaction occurring within the last 10 years: Yes If all of the above answers are "NO", then may proceed with Cephalosporin use.   Prior to Admission medications   Medication Sig Start Date End Date Taking? Authorizing Provider  amiodarone (PACERONE) 200 MG tablet Take 0.5 tablets (100 mg total) by mouth daily. 08/05/19  Yes Bensimhon, Shaune Pascal, MD  carvedilol (COREG) 6.25 MG tablet Take 1 tablet (6.25 mg total) by mouth 2 (two) times daily with a meal. 02/14/20  Yes Bensimhon, Shaune Pascal, MD  ELIQUIS 2.5 MG TABS  tablet TAKE 1 TABLET BY MOUTH TWICE A DAY 10/25/19  Yes Bensimhon, Shaune Pascal, MD  ezetimibe (ZETIA) 10 MG tablet TAKE ONE TABLET BY MOUTH ONCE DAILY WITH SUPPER 03/06/17  Yes Larey Dresser, MD  ferrous sulfate ([employer]S IRON) 325 (65 FE) MG tablet Take 325 mg by mouth daily with supper.    Yes [provider]  furosemide (LASIX) 20 MG tablet Take 2 tablets (40 mg total) by mouth daily. 12/11/17  Yes Bensimhon, Shaune Pascal, MD  levothyroxine (SYNTHROID) 75 MCG tablet Take 1 tablet (75 mcg total) by mouth daily before breakfast. 02/15/20  Yes Deboraha Sprang, MD  Multiple Vitamins-Minerals (CENTRUM PO)  Take 1 tablet by mouth daily.   Yes [provider]  nitroGLYCERIN (NITROSTAT) 0.4 MG SL tablet DISSOLVE ONE TABLET UNDER THE TONGUE EVERY 5 MINUTES AS NEEDED FOR CHEST PAIN.  DO NOT EXCEED A TOTAL OF 3 DOSES IN 15 MINUTES 12/26/16  Yes Larey Dresser, MD  NONFORMULARY OR COMPOUNDED ITEM Apply to the affected nail(s) once (at bedtime) Terbinafine3%,Fluconazole 2%,Tea tree oil5%,Urea 10%,Ibuprofren 2 %, in DMSO suspension #14mL 10/29/19  Yes [place], Stephani Police, DPM  pantoprazole (PROTONIX) 40 MG tablet Take 40 mg by mouth daily.  01/15/11  Yes [provider]  potassium chloride (K-DUR,KLOR-CON) 10 MEQ tablet Take 1 tablet (10 mEq total) by mouth daily. 12/13/17  Yes Velvet Bathe, MD  rosuvastatin (CRESTOR) 20 MG tablet Take 1 tablet (20 mg total) by mouth daily. 10/18/15  Yes Bensimhon, Shaune Pascal, MD  tamsulosin (FLOMAX) 0.4 MG CAPS capsule Take 0.4 mg by mouth at bedtime.  11/23/13  Yes [provider]     Physical Exam: The T-tube is in good position is patent and dry and the TM is clear.  Minimal wax buildup.   Assessment: S/p placement of left T-tube 2 weeks ago. Chronic eustachian tube dysfunction  Plan: Recommend use of nasal steroid spray which he already has and can take antihistamine that may help the popping.  Otherwise TM and T-tube look good.   Radene Journey, MD

## 2020-03-02 ENCOUNTER — Ambulatory Visit (HOSPITAL_COMMUNITY)
Admission: RE | Admit: 2020-03-02 | Discharge: 2020-03-02 | Disposition: A | Discharge status: Home / Self Care | Payer: Medicare Other | Source: Ambulatory Visit | Attending: Internal Medicine | Admitting: Internal Medicine

## 2020-03-02 ENCOUNTER — Encounter (HOSPITAL_COMMUNITY): Payer: Self-pay | Admitting: Internal Medicine

## 2020-03-02 ENCOUNTER — Other Ambulatory Visit: Payer: Self-pay

## 2020-03-02 VITALS — BP 116/84 | HR 72 | Wt 190.0 lb

## 2020-03-02 DIAGNOSIS — N400 Enlarged prostate without lower urinary tract symptoms: Secondary | ICD-10-CM | POA: Diagnosis not present

## 2020-03-02 DIAGNOSIS — Z79899 Other long term (current) drug therapy: Secondary | ICD-10-CM | POA: Insufficient documentation

## 2020-03-02 DIAGNOSIS — G250 Essential tremor: Secondary | ICD-10-CM | POA: Diagnosis not present

## 2020-03-02 DIAGNOSIS — Z8719 Personal history of other diseases of the digestive system: Secondary | ICD-10-CM | POA: Insufficient documentation

## 2020-03-02 DIAGNOSIS — K219 Gastro-esophageal reflux disease without esophagitis: Secondary | ICD-10-CM | POA: Insufficient documentation

## 2020-03-02 DIAGNOSIS — I2510 Atherosclerotic heart disease of native coronary artery without angina pectoris: Secondary | ICD-10-CM

## 2020-03-02 DIAGNOSIS — I5022 Chronic systolic (congestive) heart failure: Secondary | ICD-10-CM | POA: Insufficient documentation

## 2020-03-02 DIAGNOSIS — Z955 Presence of coronary angioplasty implant and graft: Secondary | ICD-10-CM | POA: Diagnosis not present

## 2020-03-02 DIAGNOSIS — Z8619 Personal history of other infectious and parasitic diseases: Secondary | ICD-10-CM | POA: Diagnosis not present

## 2020-03-02 DIAGNOSIS — Z79890 Hormone replacement therapy (postmenopausal): Secondary | ICD-10-CM | POA: Insufficient documentation

## 2020-03-02 DIAGNOSIS — G4730 Sleep apnea, unspecified: Secondary | ICD-10-CM | POA: Insufficient documentation

## 2020-03-02 DIAGNOSIS — Z8551 Personal history of malignant neoplasm of bladder: Secondary | ICD-10-CM | POA: Diagnosis not present

## 2020-03-02 DIAGNOSIS — Z85828 Personal history of other malignant neoplasm of skin: Secondary | ICD-10-CM | POA: Insufficient documentation

## 2020-03-02 DIAGNOSIS — Z95810 Presence of automatic (implantable) cardiac defibrillator: Secondary | ICD-10-CM | POA: Diagnosis not present

## 2020-03-02 DIAGNOSIS — I130 Hypertensive heart and chronic kidney disease with heart failure and stage 1 through stage 4 chronic kidney disease, or unspecified chronic kidney disease: Secondary | ICD-10-CM | POA: Diagnosis not present

## 2020-03-02 DIAGNOSIS — E785 Hyperlipidemia, unspecified: Secondary | ICD-10-CM | POA: Diagnosis not present

## 2020-03-02 DIAGNOSIS — N184 Chronic kidney disease, stage 4 (severe): Secondary | ICD-10-CM | POA: Insufficient documentation

## 2020-03-02 DIAGNOSIS — E039 Hypothyroidism, unspecified: Secondary | ICD-10-CM | POA: Insufficient documentation

## 2020-03-02 DIAGNOSIS — I480 Paroxysmal atrial fibrillation: Secondary | ICD-10-CM | POA: Diagnosis not present

## 2020-03-02 DIAGNOSIS — I252 Old myocardial infarction: Secondary | ICD-10-CM | POA: Diagnosis not present

## 2020-03-02 DIAGNOSIS — Z7901 Long term (current) use of anticoagulants: Secondary | ICD-10-CM | POA: Insufficient documentation

## 2020-03-02 MED ORDER — FARXIGA 10 MG PO TABS
10.0000 mg | ORAL_TABLET | Freq: Every day | ORAL | 11 refills | Status: DC
Start: 2020-03-02 — End: 2020-05-23

## 2020-03-02 NOTE — Progress Notes (Signed)
Patient ID: [name], male   DOB: 1932-04-15, 86 y.o.   MRN: [id number]    Advanced Heart Failure Clinic Note   PCP: Dr. Virgina Jock  HPI: [name] is a 86 y.o. male with a history of coronary artery disease, status post previous large anterior wall myocardial infarction s/p multivessel stenting, systolic CHF with EF 33% s/p St. Jude BiV ICD.  Remainder of his medical history is notable for atrial fibrillation,maintaining sinus rhythm on amiodarone.  He is not on Coumadin secondary to GI bleed, chronic renal insufficiency with baseline creatinine about 2.2-2.5, hypertension, hyperlipidemia, and a systemic tremor.Bladder cancer 05/2012. Completed BCG treatment.   2015 found that his left ventricular lead has not been capturing suggestive of either fracture of the proximal electrode or an issue at the header. They reprogrammed the device to use to the RV coil which he tolerated well and had a good threshold.   He was admitted in 11/17 for respiratory virus.  Admitted 12/30 - 10/15/16 with A/C CHF in setting of URI. Thought to have mild CHF, so given IV diuresis in house with rapid improvement.   Admitted 2/19 with enterococcus bacteremia thought to be UTI. Treated with daptomycin. TEE EF 20-25% no vegetation mod MR  He presents today for regular follow up. Says he feels good. Denies CP. Mild DOE. (no change). No orthopnea or PND.    ICD:  Personally reviewed. Volume level mildly elevated. No VT. AF burden 9.2% 96% biv pacing Personally reviewed   Echo 12/14 LVEF 20-25% Carotid u/s 3/14: 40-59% R 0-39%%L Echo 11/2017: EF 25%   Review of systems complete and found to be negative unless listed in HPI.    Past Medical History:  Diagnosis Date  . Acute bronchitis with COPD ([place]) 09/10/2016  . Anemia   . Atrial fibrillation or flutter    maintaining sinus on amiodarone    . Bladder cancer ([place]) dx'd 05/2012  . BPH (benign prostatic hyperplasia)   . CAD (coronary artery disease)      a. s/p anterior MI 12/05 c/b shock -> stent LAD   b. s/p stenting OM-1, 2/06  . CHF (congestive heart failure) (HCC)    due to ischemic CM  a. EF 20-30%. (Nov 2008)   b. s/p St. Jude BiV-ICD    c. CPX 07/2008  pvo2 16.3 (63% predicted) slope 34 RER 1.08 O2 pulse 93%  . Cough    occasional /productive, no fever/ not new  . CRI (chronic renal insufficiency)    (baseline 2.0-2.2)/ recent hospitalization 8/13  . GERD (gastroesophageal reflux disease)    hx Barretts esophagitis  . History of blood transfusion    following coumadin  usage  . HTN (hypertension)    EKG 05/14/12,Chest x ray 8/13, last ICD interrogation 8/13 EPIC  . Hyperlipidemia   . Hypothyroidism   . 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 Neuromuscular disorder ([place])    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"   no neurologist  . Skin cancer    basal cells   facial x 4, 1 right forearm  . Sleep apnea    STOP BANG SCORE 4    Current Outpatient Medications  Medication Sig Dispense Refill  . amiodarone (PACERONE) 200 MG tablet Take 0.5 tablets (100 mg total) by mouth daily. 30 tablet 11  . carvedilol (COREG) 6.25 MG tablet Take 1 tablet (6.25 mg total) by mouth 2 (two) times daily with a meal. 60 tablet 3  .  ELIQUIS 2.5 MG TABS tablet TAKE 1 TABLET BY MOUTH TWICE A DAY 60 tablet 5  . ezetimibe (ZETIA) 10 MG tablet TAKE ONE TABLET BY MOUTH ONCE DAILY WITH SUPPER 90 tablet 3  . ferrous sulfate (CVS IRON) 325 (65 FE) MG tablet Take 325 mg by mouth daily with supper.     . furosemide (LASIX) 20 MG tablet Take 2 tablets (40 mg total) by mouth daily. 180 tablet 2  . levothyroxine (SYNTHROID) 75 MCG tablet Take 1 tablet (75 mcg total) by mouth daily before breakfast. 30 tablet 11  . Multiple Vitamins-Minerals (CENTRUM PO) Take 1 tablet by mouth daily.    . nitroGLYCERIN (NITROSTAT) 0.4 MG SL tablet DISSOLVE ONE TABLET UNDER THE TONGUE EVERY 5 MINUTES AS NEEDED FOR CHEST PAIN.  DO NOT EXCEED A TOTAL  OF 3 DOSES IN 15 MINUTES 25 tablet 0  . pantoprazole (PROTONIX) 40 MG tablet Take 40 mg by mouth daily.     . potassium chloride (K-DUR,KLOR-CON) 10 MEQ tablet Take 1 tablet (10 mEq total) by mouth daily. 30 tablet 0  . rosuvastatin (CRESTOR) 20 MG tablet Take 1 tablet (20 mg total) by mouth daily. 90 tablet 3  . tamsulosin (FLOMAX) 0.4 MG CAPS capsule Take 0.4 mg by mouth at bedtime.      No current facility-administered medications for this encounter.   PHYSICAL EXAM: Vitals:   03/02/20 1415  BP: 116/84  Pulse: 72  SpO2: 96%  Weight: 86.2 kg (190 lb)     Wt Readings from Last 3 Encounters:  03/02/20 86.2 kg (190 lb)  02/14/20 85.7 kg (189 lb)  09/24/19 81.6 kg (179 lb 12.8 oz)    General:  Elderly Well appearing. No resp difficulty HEENT: normal Neck: supple. JVP 7. Carotids 2+ bilat; no bruits. No lymphadenopathy or thryomegaly appreciated. Cor: PMI nondisplaced. Regular rate & rhythm. No rubs, gallops or murmurs. Lungs: clear Abdomen: soft, nontender, nondistended. No hepatosplenomegaly. No bruits or masses. Good bowel sounds. Extremities: no cyanosis, clubbing, rash, trace-1+ edema Neuro: alert & orientedx3, cranial nerves grossly intact. moves all 4 extremities w/o difficulty. Affect pleasant   ASSESSMENT & PLAN: 1. Chronic systolic CHF. Echo 10/13/2017 LVEF 20-25% (Stable from last year). Echo 2/19 EF 20-25% - Stable chronic NYHA II-III symptoms.  - Volume status mildly elevated on exam and [place].  - ICD interrogated personally as above - Continue lasix 40 mg daily.  - BP too low to add valsartan back especially with CKD IV. - Continue coreg 6.25 mg BID.  - Add Farxiga 10 (d/w Dr. Joelyn Oms)  2. CAD - No s/s of ischemia - Off ASA due to Eliquis.  - Continue statin. Lipids followed by Dr. Virgina Jock. Goal LDL < 70  3. PAF  - Remains in NSR - Now off amiodarone. Tremors have not gotten better.  - Denies bleeding. Continue Eliquis 2.5 mg BID.   4. Carotid ultrasound   - 1-39% bilaterally on last scan 3/15. No repeat warranted.   no change  5. CKD, Stage IV  - Creatinine stable at 2.3 Saw Dr. Joelyn Oms recently and it was stable - Adding [place] labs in 2 weeks .   6. Essential Tremors  - Followed by PCP. Had a neurologic work up and negative for [employer].   - Tremors likely related to his amio. Now pff amio and imrpvoed    Glori Bickers, MD  2:39 PM

## 2020-03-02 NOTE — Patient Instructions (Signed)
Start Farxiga 10 mg daily  Labs needed in 2 weeks  Please call our office in November to schedule your follow up appointment  If you have any questions or concerns before your next appointment please send [place] a message through [place] or call our office at [phone].  At the [facility], you and your health needs are our priority. As part of our continuing mission to provide you with exceptional heart care, we have created designated Provider Care Teams. These Care Teams include your primary Cardiologist (physician) and Advanced Practice Providers (APPs- Physician Assistants and Nurse Practitioners) who all work together to provide you with the care you need, when you need it.   You may see any of the following providers on your designated Care Team at your next follow up: Marland Kitchen Dr Glori Bickers . Dr Loralie Champagne . Darrick Grinder, NP . Lyda Jester, PA . Audry Riles, PharmD   Please be sure to bring in all your medications bottles to every appointment.

## 2020-03-06 ENCOUNTER — Telehealth: Payer: Self-pay | Admitting: Internal Medicine

## 2020-03-06 DIAGNOSIS — I5022 Chronic systolic (congestive) heart failure: Secondary | ICD-10-CM | POA: Diagnosis not present

## 2020-03-06 DIAGNOSIS — I130 Hypertensive heart and chronic kidney disease with heart failure and stage 1 through stage 4 chronic kidney disease, or unspecified chronic kidney disease: Secondary | ICD-10-CM | POA: Diagnosis not present

## 2020-03-06 DIAGNOSIS — Z95810 Presence of automatic (implantable) cardiac defibrillator: Secondary | ICD-10-CM | POA: Diagnosis not present

## 2020-03-06 DIAGNOSIS — J309 Allergic rhinitis, unspecified: Secondary | ICD-10-CM | POA: Diagnosis not present

## 2020-03-06 DIAGNOSIS — I480 Paroxysmal atrial fibrillation: Secondary | ICD-10-CM | POA: Diagnosis not present

## 2020-03-06 DIAGNOSIS — R251 Tremor, unspecified: Secondary | ICD-10-CM | POA: Diagnosis not present

## 2020-03-06 DIAGNOSIS — L600 Ingrowing nail: Secondary | ICD-10-CM | POA: Diagnosis not present

## 2020-03-06 DIAGNOSIS — M542 Cervicalgia: Secondary | ICD-10-CM | POA: Insufficient documentation

## 2020-03-06 DIAGNOSIS — I472 Ventricular tachycardia: Secondary | ICD-10-CM | POA: Diagnosis not present

## 2020-03-06 DIAGNOSIS — Z950 Presence of cardiac pacemaker: Secondary | ICD-10-CM | POA: Diagnosis not present

## 2020-03-06 DIAGNOSIS — I2510 Atherosclerotic heart disease of native coronary artery without angina pectoris: Secondary | ICD-10-CM | POA: Diagnosis not present

## 2020-03-06 DIAGNOSIS — E039 Hypothyroidism, unspecified: Secondary | ICD-10-CM | POA: Diagnosis not present

## 2020-03-06 NOTE — Telephone Encounter (Signed)
New Message    Pts wife is calling and is wondering if the lab work the pt had done for Dr Haroldine Laws would be okay so the pt does not have to get labs done twice   Please advise

## 2020-03-08 ENCOUNTER — Other Ambulatory Visit (HOSPITAL_COMMUNITY): Payer: Self-pay | Admitting: *Deleted

## 2020-03-08 DIAGNOSIS — E039 Hypothyroidism, unspecified: Secondary | ICD-10-CM

## 2020-03-08 DIAGNOSIS — I5022 Chronic systolic (congestive) heart failure: Secondary | ICD-10-CM

## 2020-03-08 NOTE — Progress Notes (Signed)
Needs repeat TSH per Dr Caryl Comes, wants it drawn here with bmet

## 2020-03-09 NOTE — Telephone Encounter (Signed)
Attempted return phone call.  No answer and no voicemail.

## 2020-03-10 NOTE — Telephone Encounter (Signed)
Spoke with pt's with and informed her RN has reached out to [employer] at CHF clinic and asked if they would draw pt's TSH on 03/16/2020 to prevent pt from having to be stuck twice in one week and was advised by Nira Conn that would be fine.  Order has already been placed.  Pt's wife verbalizes understanding and agrees with current plan.

## 2020-03-16 ENCOUNTER — Ambulatory Visit (HOSPITAL_COMMUNITY)
Admission: RE | Admit: 2020-03-16 | Discharge: 2020-03-16 | Disposition: A | Discharge status: Home / Self Care | Payer: Medicare Other | Source: Ambulatory Visit | Attending: Cardiology | Admitting: Cardiology

## 2020-03-16 ENCOUNTER — Other Ambulatory Visit: Payer: Self-pay

## 2020-03-16 DIAGNOSIS — I5022 Chronic systolic (congestive) heart failure: Secondary | ICD-10-CM | POA: Insufficient documentation

## 2020-03-16 DIAGNOSIS — E039 Hypothyroidism, unspecified: Secondary | ICD-10-CM | POA: Diagnosis not present

## 2020-03-16 LAB — BASIC METABOLIC PANEL
Anion gap: 9 (ref 5–15)
BUN: 28 mg/dL — ABNORMAL HIGH (ref 8–23)
CO2: 24 mmol/L (ref 22–32)
Calcium: 8.6 mg/dL — ABNORMAL LOW (ref 8.9–10.3)
Chloride: 109 mmol/L (ref 98–111)
Creatinine, Ser: 2.54 mg/dL — ABNORMAL HIGH (ref 0.61–1.24)
GFR calc Af Amer: 25 mL/min — ABNORMAL LOW (ref >60)
GFR calc non Af Amer: 22 mL/min — ABNORMAL LOW (ref >60)
Glucose, Bld: 105 mg/dL — ABNORMAL HIGH (ref 70–99)
Potassium: 3.7 mmol/L (ref 3.5–5.1)
Sodium: 142 mmol/L (ref 135–145)

## 2020-03-16 LAB — TSH: TSH: 0.725 u[IU]/mL (ref 0.350–4.500)

## 2020-03-20 ENCOUNTER — Other Ambulatory Visit: Payer: Self-pay

## 2020-03-20 ENCOUNTER — Ambulatory Visit (INDEPENDENT_AMBULATORY_CARE_PROVIDER_SITE_OTHER): Payer: Medicare Other | Admitting: Podiatry

## 2020-03-20 ENCOUNTER — Other Ambulatory Visit: Payer: Medicare Other

## 2020-03-20 ENCOUNTER — Encounter: Payer: Self-pay | Admitting: Podiatry

## 2020-03-20 DIAGNOSIS — M79675 Pain in left toe(s): Secondary | ICD-10-CM

## 2020-03-20 DIAGNOSIS — B351 Tinea unguium: Secondary | ICD-10-CM

## 2020-03-20 DIAGNOSIS — M79674 Pain in right toe(s): Secondary | ICD-10-CM

## 2020-03-24 ENCOUNTER — Other Ambulatory Visit: Payer: Self-pay

## 2020-03-24 DIAGNOSIS — E039 Hypothyroidism, unspecified: Secondary | ICD-10-CM

## 2020-03-24 NOTE — Progress Notes (Signed)
Subjective: [name] is a 86 y.o. male patient seen today painful mycotic nails b/l that are difficult to trim. Pain interferes with ambulation. Aggravating factors include wearing enclosed shoe gear. Pain is relieved with periodic professional debridement.   His wife is present during today's visit. [name] states his great toes feel better now. Voices no new pedal problems on today's visit.  Patient Active Problem List   Diagnosis Date Noted  . Enterococcal bacteremia 12/08/2017  . Nausea & vomiting 12/06/2017  . Renal insufficiency 12/06/2017  . Acute respiratory failure with hypoxia ([place]) 09/10/2016  . Acute bronchitis with COPD ([place]) 09/10/2016  . CKD (chronic kidney disease) stage 4, GFR 15-29 ml/min (HCC) 04/04/2015  . 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 Urosepsis 08/16/2013  . Bruising 08/12/2012  . Bladder cancer ([place]) 07/08/2012  . UTI (urinary tract infection) 05/27/2012  . Bladder tumor 05/18/2012  . Knee pain, acute, right 02/11/2012  . CAROTID ARTERY DISEASE 12/06/2010  . Essential hypertension, benign 09/22/2009  . Chronic systolic CHF (congestive heart failure) (HCC)-EF 25% 09/14/2009  . CHEST PAIN 08/08/2009  . Mixed hyperlipidemia 05/25/2009  . CAD, NATIVE VESSEL 02/01/2009  . Ischemic cardiomyopathy 01/24/2009  . Atrial fibrillation ([place]) 01/21/2009  . 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 amiodarone (PACERONE) 200 MG tablet Take 0.5 tablets (100 mg total) by mouth daily. 30 tablet 11  . carvedilol (COREG) 6.25 MG tablet Take 1 tablet (6.25 mg total) by mouth 2 (two) times daily with a meal. 60 tablet 3  . dapagliflozin propanediol (FARXIGA) 10 MG TABS tablet Take 10 mg by mouth daily before breakfast. 30 tablet 11  . ELIQUIS 2.5 MG TABS tablet TAKE 1 TABLET BY MOUTH  TWICE A DAY 60 tablet 5  . ezetimibe (ZETIA) 10 MG tablet TAKE ONE TABLET BY MOUTH ONCE DAILY WITH SUPPER 90 tablet 3  . ferrous sulfate (CVS IRON) 325 (65 FE) MG tablet Take 325 mg by mouth daily with supper.     . furosemide (LASIX) 20 MG tablet Take 2 tablets (40 mg total) by mouth daily. 180 tablet 2  . levothyroxine (SYNTHROID) 75 MCG tablet Take 1 tablet (75 mcg total) by mouth daily before breakfast. 30 tablet 11  . Multiple Vitamins-Minerals (CENTRUM PO) Take 1 tablet by mouth daily.    . nitroGLYCERIN (NITROSTAT) 0.4 MG SL tablet DISSOLVE ONE TABLET UNDER THE TONGUE EVERY 5 MINUTES AS NEEDED FOR CHEST PAIN.  DO NOT EXCEED A TOTAL OF 3 DOSES IN 15 MINUTES 25 tablet 0  . pantoprazole (PROTONIX) 40 MG tablet Take 40 mg by mouth daily.     . potassium chloride (K-DUR,KLOR-CON) 10 MEQ tablet Take 1 tablet (10 mEq total) by mouth daily. 30 tablet 0  . rosuvastatin (CRESTOR) 20 MG tablet Take 1 tablet (20 mg total) by mouth daily. 90 tablet 3  . tamsulosin (FLOMAX) 0.4 MG CAPS capsule Take 0.4 mg by mouth at bedtime.      No current facility-administered medications on file prior to visit.    Allergies  Allergen Reactions  . Ramipril Other (See Comments)    cough  . Penicillins Itching and Rash    Has patient had a PCN reaction causing immediate rash, facial/tongue/throat swelling, SOB or lightheadedness with hypotension: No Has patient had a PCN reaction causing severe rash involving mucus membranes or skin  necrosis: Yes-back and hands. Has patient had a PCN reaction that required hospitalization: No Has patient had a PCN reaction occurring within the last 10 years: Yes If all of the above answers are "NO", then may proceed with Cephalosporin use.    Objective: Physical Exam  General: Patient is a pleasant 86 y.o. Caucasian male in no acute distress, Awake, alert and oriented x 3  Neurovascular status unchanged b/l.  Neurovascular status unchanged b/l lower extremities. Capillary  fill time to digits <3 seconds b/l lower extremities. Palpable DP pulses b/l. Palpable PT pulses b/l. Pedal hair absent b/l. Skin temperature gradient within normal limits b/l. No pain with calf compression b/l. No edema noted b/l.   Protective sensation intact 5/5 intact bilaterally with 10g monofilament b/l. Vibratory sensation intact b/l. Proprioception intact bilaterally.  Dermatological:  Pedal skin with normal turgor, texture and tone bilaterally. No open wounds bilaterally. No interdigital macerations bilaterally. Toenails 1-5 b/l elongated, discolored, dystrophic, thickened, crumbly with subungual debris and tenderness to dorsal palpation.  Musculoskeletal:  Normal muscle strength 5/5 to all lower extremity muscle groups bilaterally. No pain crepitus or joint limitation noted with ROM b/l. Hallux valgus with bunion deformity noted b/l lower extremities.  Assessment and Plan:  1. Pain due to onychomycosis of toenails of both feet    -Examined patient. -Toenails 1-5 b/l were debrided in length and girth with sterile nail nippers and dremel without iatrogenic bleeding.  -Patient to report any pedal injuries to medical professional immediately. -Patient to continue soft, supportive shoe gear daily. -Patient/POA to call should there be question/concern in the interim.  Return in about 9 weeks (around 05/22/2020) for nail trim.  Marzetta Board, DPM

## 2020-03-29 ENCOUNTER — Telehealth (HOSPITAL_COMMUNITY): Payer: Self-pay | Admitting: *Deleted

## 2020-03-29 DIAGNOSIS — I5022 Chronic systolic (congestive) heart failure: Secondary | ICD-10-CM

## 2020-03-29 NOTE — Telephone Encounter (Signed)
Pts wife called stating pt has been "stumbly" and having issues with balance. She was wondering if this could be a side effect of farxiga. Wilder Glade was started at last office visit on 03/02/2020.  Routed to [place]

## 2020-03-29 NOTE — Telephone Encounter (Signed)
Stop lasix and continue [place].  Have patient drink extra fluid for 2-3 days.

## 2020-03-30 NOTE — Addendum Note (Signed)
Addended by: Harvie Junior on: 03/30/2020 09:05 AM   Modules accepted: Orders

## 2020-03-30 NOTE — Telephone Encounter (Signed)
Wife aware and voiced understanding. Lab apt scheduled for tomorrow.

## 2020-03-31 ENCOUNTER — Other Ambulatory Visit: Payer: Self-pay

## 2020-03-31 ENCOUNTER — Ambulatory Visit (HOSPITAL_COMMUNITY)
Admission: RE | Admit: 2020-03-31 | Discharge: 2020-03-31 | Disposition: A | Discharge status: Home / Self Care | Payer: Medicare Other | Source: Ambulatory Visit | Attending: Cardiology | Admitting: Cardiology

## 2020-03-31 ENCOUNTER — Telehealth: Payer: Self-pay | Admitting: Internal Medicine

## 2020-03-31 DIAGNOSIS — I5022 Chronic systolic (congestive) heart failure: Secondary | ICD-10-CM | POA: Diagnosis present

## 2020-03-31 LAB — BASIC METABOLIC PANEL
Anion gap: 8 (ref 5–15)
BUN: 28 mg/dL — ABNORMAL HIGH (ref 8–23)
CO2: 23 mmol/L (ref 22–32)
Calcium: 8.6 mg/dL — ABNORMAL LOW (ref 8.9–10.3)
Chloride: 109 mmol/L (ref 98–111)
Creatinine, Ser: 2.49 mg/dL — ABNORMAL HIGH (ref 0.61–1.24)
GFR calc Af Amer: 26 mL/min — ABNORMAL LOW (ref >60)
GFR calc non Af Amer: 23 mL/min — ABNORMAL LOW (ref >60)
Glucose, Bld: 107 mg/dL — ABNORMAL HIGH (ref 70–99)
Potassium: 4.5 mmol/L (ref 3.5–5.1)
Sodium: 140 mmol/L (ref 135–145)

## 2020-03-31 NOTE — Telephone Encounter (Signed)
Pt c/o medication issue:  1. Name of Medication: levothyroxine (SYNTHROID) 75 MCG tablet  2. How are you currently taking this medication (dosage and times per day)?   3. Are you having a reaction (difficulty breathing--STAT)? no  4. What is your medication issue? Wife of the patient went to pick up this medication and the dosage changed as well as the instructions of how to take it.  The wife said the patient's PCP has the patient taking [place], Sat and Sun. That is different than how Dr. Caryl Comes wrote it (75 MCG daily). She has not accepted the new rx yet and will not until she hears from Dr. Caryl Comes

## 2020-03-31 NOTE — Telephone Encounter (Signed)
Pt's wife aware Dr Caryl Comes did increase Levothyroxine to 75 mcg qd based on lab values for 5/3 and repeat labs in 1 month labs normal on 6/3 .  Pt to continue same med no changes and  repeat TSH in August ./cy

## 2020-04-03 ENCOUNTER — Other Ambulatory Visit (HOSPITAL_COMMUNITY): Payer: Medicare Other

## 2020-04-19 ENCOUNTER — Ambulatory Visit (INDEPENDENT_AMBULATORY_CARE_PROVIDER_SITE_OTHER): Payer: Medicare Other | Admitting: *Deleted

## 2020-04-19 DIAGNOSIS — I255 Ischemic cardiomyopathy: Secondary | ICD-10-CM

## 2020-04-19 DIAGNOSIS — N184 Chronic kidney disease, stage 4 (severe): Secondary | ICD-10-CM | POA: Diagnosis not present

## 2020-04-19 LAB — CUP PACEART REMOTE DEVICE CHECK
Battery Remaining Longevity: 6 mo
Battery Remaining Percentage: 9 %
Battery Voltage: 2.66 V
Brady Statistic AP VP Percent: 97 %
Brady Statistic AP VS Percent: 1 %
Brady Statistic AS VP Percent: 2.8 %
Brady Statistic AS VS Percent: 1 %
Brady Statistic RA Percent Paced: 88 %
Date Time Interrogation Session: 20210707020027
HighPow Impedance: 52 Ohm
HighPow Impedance: 53 Ohm
Implantable Lead Implant Date: 20060509
Implantable Lead Implant Date: 20060509
Implantable Lead Implant Date: 20090417
Implantable Lead Location: 753858
Implantable Lead Location: 753859
Implantable Lead Location: 753860
Implantable Lead Model: 5076
Implantable Lead Model: 7001
Implantable Pulse Generator Implant Date: 20141208
Lead Channel Impedance Value: 380 Ohm
Lead Channel Impedance Value: 410 Ohm
Lead Channel Impedance Value: 480 Ohm
Lead Channel Pacing Threshold Amplitude: 0.5 V
Lead Channel Pacing Threshold Amplitude: 1 V
Lead Channel Pacing Threshold Amplitude: 1.125 V
Lead Channel Pacing Threshold Pulse Width: 0.5 ms
Lead Channel Pacing Threshold Pulse Width: 0.8 ms
Lead Channel Pacing Threshold Pulse Width: 0.8 ms
Lead Channel Sensing Intrinsic Amplitude: 12 mV
Lead Channel Sensing Intrinsic Amplitude: 3.4 mV
Lead Channel Setting Pacing Amplitude: 2 V
Lead Channel Setting Pacing Amplitude: 2 V
Lead Channel Setting Pacing Amplitude: 2.125
Lead Channel Setting Pacing Pulse Width: 0.8 ms
Lead Channel Setting Pacing Pulse Width: 0.8 ms
Lead Channel Setting Sensing Sensitivity: 0.5 mV
Pulse Gen Serial Number: 7138995

## 2020-04-21 NOTE — Progress Notes (Signed)
Remote ICD transmission.   

## 2020-04-24 ENCOUNTER — Other Ambulatory Visit (HOSPITAL_COMMUNITY): Payer: Self-pay | Admitting: Internal Medicine

## 2020-05-23 ENCOUNTER — Other Ambulatory Visit (HOSPITAL_COMMUNITY): Payer: Self-pay

## 2020-05-23 MED ORDER — DAPAGLIFLOZIN PROPANEDIOL 10 MG PO TABS: 10.0000 mg | ORAL_TABLET | Freq: Every day | ORAL | 3 refills | Status: DC

## 2020-05-24 ENCOUNTER — Other Ambulatory Visit: Payer: Self-pay

## 2020-05-24 ENCOUNTER — Other Ambulatory Visit: Payer: Medicare Other | Admitting: *Deleted

## 2020-05-24 DIAGNOSIS — E039 Hypothyroidism, unspecified: Secondary | ICD-10-CM | POA: Diagnosis not present

## 2020-05-24 LAB — TSH: TSH: 0.757 u[IU]/mL (ref 0.450–4.500)

## 2020-05-25 ENCOUNTER — Telehealth: Payer: Self-pay | Admitting: Internal Medicine

## 2020-05-25 NOTE — Telephone Encounter (Signed)
Patient got a letter for his yearly checkup for his pacemaker but Dr. Caryl Comes doesn't have any appointments until December. The wife wanted to know if it was OK for him to wait that long because Dr. Caryl Comes just adjusted some of his medications. Please call the wife to discuss the letter

## 2020-05-30 NOTE — Telephone Encounter (Signed)
Spoke with pt's wife and scheduled appointment for pt 09/06/2020 at 1145am.  Pt's wife verbalizes understanding and agrees with current plan.

## 2020-06-02 ENCOUNTER — Ambulatory Visit (INDEPENDENT_AMBULATORY_CARE_PROVIDER_SITE_OTHER): Payer: Medicare Other | Admitting: Podiatry

## 2020-06-02 ENCOUNTER — Other Ambulatory Visit: Payer: Self-pay

## 2020-06-02 ENCOUNTER — Encounter: Payer: Self-pay | Admitting: Podiatry

## 2020-06-02 DIAGNOSIS — B351 Tinea unguium: Secondary | ICD-10-CM

## 2020-06-02 DIAGNOSIS — M79674 Pain in right toe(s): Secondary | ICD-10-CM | POA: Diagnosis not present

## 2020-06-02 DIAGNOSIS — M79675 Pain in left toe(s): Secondary | ICD-10-CM

## 2020-06-03 NOTE — Progress Notes (Signed)
Subjective: [name] is a 86 y.o. male patient seen today for painful mycotic nails b/l that are difficult to trim. Pain interferes with ambulation. Aggravating factors include wearing enclosed shoe gear. Pain is relieved with periodic professional debridement.   His wife is present during today's visit. They voice no new pedal concerns on today's visit.  Patient Active Problem List   Diagnosis Date Noted  . Enterococcal bacteremia 12/08/2017  . Nausea & vomiting 12/06/2017  . Renal insufficiency 12/06/2017  . Acute respiratory failure with hypoxia ([place]) 09/10/2016  . Acute bronchitis with COPD ([place]) 09/10/2016  . CKD (chronic kidney disease) stage 4, GFR 15-29 ml/min (HCC) 04/04/2015  . 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 Urosepsis 08/16/2013  . Bruising 08/12/2012  . Bladder cancer ([place]) 07/08/2012  . UTI (urinary tract infection) 05/27/2012  . Bladder tumor 05/18/2012  . Knee pain, acute, right 02/11/2012  . CAROTID ARTERY DISEASE 12/06/2010  . Essential hypertension, benign 09/22/2009  . Chronic systolic CHF (congestive heart failure) (HCC)-EF 25% 09/14/2009  . CHEST PAIN 08/08/2009  . Mixed hyperlipidemia 05/25/2009  . CAD, NATIVE VESSEL 02/01/2009  . Ischemic cardiomyopathy 01/24/2009  . Atrial fibrillation ([place]) 01/21/2009  . 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 amiodarone (PACERONE) 200 MG tablet Take 0.5 tablets (100 mg total) by mouth daily. 30 tablet 11  . carvedilol (COREG) 6.25 MG tablet Take 1 tablet (6.25 mg total) by mouth 2 (two) times daily with a meal. 60 tablet 3  . dapagliflozin propanediol (FARXIGA) 10 MG TABS tablet Take 1 tablet (10 mg total) by mouth daily before breakfast. 90 tablet 3  . ELIQUIS 2.5 MG TABS tablet TAKE 1 TABLET BY MOUTH TWICE A DAY 180 tablet 3  .  ezetimibe (ZETIA) 10 MG tablet TAKE ONE TABLET BY MOUTH ONCE DAILY WITH SUPPER 90 tablet 3  . ferrous sulfate ([employer]S IRON) 325 (65 FE) MG tablet Take 325 mg by mouth daily with supper.     . furosemide (LASIX) 20 MG tablet Take 2 tablets (40 mg total) by mouth daily. 180 tablet 2  . levothyroxine (SYNTHROID) 75 MCG tablet Take 1 tablet (75 mcg total) by mouth daily before breakfast. 30 tablet 11  . Multiple Vitamins-Minerals (CENTRUM PO) Take 1 tablet by mouth daily.    . nitroGLYCERIN (NITROSTAT) 0.4 MG SL tablet DISSOLVE ONE TABLET UNDER THE TONGUE EVERY 5 MINUTES AS NEEDED FOR CHEST PAIN.  DO NOT EXCEED A TOTAL OF 3 DOSES IN 15 MINUTES 25 tablet 0  . pantoprazole (PROTONIX) 40 MG tablet Take 40 mg by mouth daily.     . potassium chloride (K-DUR,KLOR-CON) 10 MEQ tablet Take 1 tablet (10 mEq total) by mouth daily. 30 tablet 0  . rosuvastatin (CRESTOR) 20 MG tablet Take 1 tablet (20 mg total) by mouth daily. 90 tablet 3  . tamsulosin (FLOMAX) 0.4 MG CAPS capsule Take 0.4 mg by mouth at bedtime.      No current facility-administered medications on file prior to visit.    Allergies  Allergen Reactions  . Ramipril Other (See Comments)    cough  . Penicillins Itching and Rash    Has patient had a PCN reaction causing immediate rash, facial/tongue/throat swelling, SOB or lightheadedness with hypotension: No Has patient had a PCN reaction causing severe rash involving mucus membranes or skin necrosis: Yes-back and hands.  Has patient had a PCN reaction that required hospitalization: No Has patient had a PCN reaction occurring within the last 10 years: Yes If all of the above answers are "NO", then may proceed with Cephalosporin use.    Objective: Physical Exam  General: Patient is a pleasant 87 y.o. Caucasian male in no acute distress, Awake, alert and oriented x 3  Neurovascular status unchanged b/l.  Neurovascular status unchanged b/l lower extremities. Capillary fill time to digits <3  seconds b/l lower extremities. Palpable DP pulses b/l. Palpable PT pulses b/l. Pedal hair absent b/l. Skin temperature gradient within normal limits b/l. No pain with calf compression b/l. No edema noted b/l.   Protective sensation intact 5/5 intact bilaterally with 10g monofilament b/l. Vibratory sensation intact b/l. Proprioception intact bilaterally.  Dermatological:  Pedal skin with normal turgor, texture and tone bilaterally. No open wounds bilaterally. No interdigital macerations bilaterally. Toenails 1-5 b/l elongated, discolored, dystrophic, thickened, crumbly with subungual debris and tenderness to dorsal palpation. Light spine callus formation submet head 1 b/l.   Musculoskeletal:  Normal muscle strength 5/5 to all lower extremity muscle groups bilaterally. No pain crepitus or joint limitation noted with ROM b/l. Hallux valgus with bunion deformity noted b/l lower extremities.  Assessment and Plan:  1. Pain due to onychomycosis of toenails of both feet    -Examined patient. -Toenails 1-5 b/l were debrided in length and girth with sterile nail nippers and dremel without iatrogenic bleeding.  -Patient to report any pedal injuries to medical professional immediately. -Patient to continue soft, supportive shoe gear daily. -Patient/POA to call should there be question/concern in the interim.  Return in about 9 weeks (around 08/04/2020).  Marzetta Board, DPM

## 2020-06-05 ENCOUNTER — Other Ambulatory Visit (HOSPITAL_COMMUNITY): Payer: Self-pay | Admitting: *Deleted

## 2020-06-05 MED ORDER — CARVEDILOL 6.25 MG PO TABS: 6.2500 mg | ORAL_TABLET | Freq: Two times a day (BID) | ORAL | 3 refills | Status: DC

## 2020-06-13 DIAGNOSIS — L820 Inflamed seborrheic keratosis: Secondary | ICD-10-CM | POA: Diagnosis not present

## 2020-06-13 DIAGNOSIS — Z85828 Personal history of other malignant neoplasm of skin: Secondary | ICD-10-CM | POA: Diagnosis not present

## 2020-06-13 DIAGNOSIS — D485 Neoplasm of uncertain behavior of skin: Secondary | ICD-10-CM | POA: Diagnosis not present

## 2020-06-13 DIAGNOSIS — L570 Actinic keratosis: Secondary | ICD-10-CM | POA: Diagnosis not present

## 2020-06-13 DIAGNOSIS — C44311 Basal cell carcinoma of skin of nose: Secondary | ICD-10-CM | POA: Diagnosis not present

## 2020-06-13 DIAGNOSIS — C44629 Squamous cell carcinoma of skin of left upper limb, including shoulder: Secondary | ICD-10-CM | POA: Diagnosis not present

## 2020-06-13 DIAGNOSIS — L821 Other seborrheic keratosis: Secondary | ICD-10-CM | POA: Diagnosis not present

## 2020-07-10 DIAGNOSIS — Z23 Encounter for immunization: Secondary | ICD-10-CM | POA: Diagnosis not present

## 2020-07-18 ENCOUNTER — Other Ambulatory Visit: Payer: Self-pay

## 2020-07-18 ENCOUNTER — Ambulatory Visit (INDEPENDENT_AMBULATORY_CARE_PROVIDER_SITE_OTHER): Payer: Medicare Other | Admitting: Otolaryngology

## 2020-07-18 VITALS — Temp 97.2°F

## 2020-07-18 DIAGNOSIS — I255 Ischemic cardiomyopathy: Secondary | ICD-10-CM | POA: Diagnosis not present

## 2020-07-18 DIAGNOSIS — H66005 Acute suppurative otitis media without spontaneous rupture of ear drum, recurrent, left ear: Secondary | ICD-10-CM | POA: Diagnosis not present

## 2020-07-18 NOTE — Progress Notes (Signed)
HPI: [name] is a 86 y.o. male who returns today for evaluation of drainage from his left ear.  Patient apparently has had a tube in the left ear for over 10 years.  The present T-tube was placed about 2 years ago.  He has done well up until the last week or 2 as he has had some drainage from the left ear.  He does not have any eardrops at this point..  Past Medical History:  Diagnosis Date  . Acute bronchitis with COPD ([place]) 09/10/2016  . Anemia   . Atrial fibrillation or flutter    maintaining sinus on amiodarone    . Bladder cancer ([place]) dx'd 05/2012  . BPH (benign prostatic hyperplasia)   . CAD (coronary artery disease)     a. s/p anterior MI 12/05 c/b shock -> stent LAD   b. s/p stenting OM-1, 2/06  . CHF (congestive heart failure) (HCC)    due to ischemic CM  a. EF 20-30%. (Nov 2008)   b. s/p St. Jude BiV-ICD    c. CPX 07/2008  pvo2 16.3 (63% predicted) slope 34 RER 1.08 O2 pulse 93%  . Cough    occasional /productive, no fever/ not new  . CRI (chronic renal insufficiency)    (baseline 2.0-2.2)/ recent hospitalization 8/13  . GERD (gastroesophageal reflux disease)    hx Barretts esophagitis  . History of blood transfusion    following coumadin  usage  . HTN (hypertension)    EKG 05/14/12,Chest x ray 8/13, last ICD interrogation 8/13 EPIC  . Hyperlipidemia   . Hypothyroidism   . 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 Skin cancer    basal cells   facial x 4, 1 right forearm  . Sleep apnea    STOP BANG SCORE 4   Past Surgical History:  Procedure Laterality Date  . [place]   lower back  . CARDIAC DEFIBRILLATOR PLACEMENT     ICD St Jude; gen change 09-20-13  . CERVICAL LAMINECTOMY  1985  . COLONOSCOPY    . CORONARY ANGIOPLASTY  2005,2006  . CYSTOSCOPY W/ RETROGRADES  05/25/2012   Procedure: CYSTOSCOPY WITH RETROGRADE PYELOGRAM;   Surgeon: Molli Hazard, MD;  Location: [facility];  Service: Urology;  Laterality: Bilateral;  . CYSTOSCOPY W/ URETERAL STENT PLACEMENT  07/08/2012   Procedure: CYSTOSCOPY WITH STENT REPLACEMENT;  Surgeon: Molli Hazard, MD;  Location: [facility];  Service: Urology;  Laterality: Left;  . CYSTOSCOPY W/ URETERAL STENT REMOVAL  07/08/2012   Procedure: CYSTOSCOPY WITH STENT REMOVAL;  Surgeon: Molli Hazard, MD;  Location: [facility];  Service: Urology;  Laterality: Bilateral;  . CYSTOSCOPY/RETROGRADE/URETEROSCOPY  07/08/2012   Procedure: CYSTOSCOPY/RETROGRADE/URETEROSCOPY;  Surgeon: Molli Hazard, MD;  Location: [facility];  Service: Urology;  Laterality: Left;  . ESOPHAGOGASTRODUODENOSCOPY    . HERNIA REPAIR  1989   bilateral  . IMPLANTABLE CARDIOVERTER DEFIBRILLATOR (ICD) GENERATOR CHANGE N/A 09/20/2013   Procedure: ICD GENERATOR CHANGE;  Surgeon: Deboraha Sprang, MD;  Location: [facility] CATH LAB;  Service: Cardiovascular;  Laterality: N/A;  . TEE WITHOUT CARDIOVERSION N/A 12/10/2017   Procedure: TRANSESOPHAGEAL ECHOCARDIOGRAM (TEE);  Surgeon: Jerline Pain, MD;  Location: [facility] ENDOSCOPY;  Service: Cardiovascular;  Laterality: N/A;  . TRANSURETHRAL RESECTION OF BLADDER TUMOR  05/25/2012   cold cup biopsy prostate  . TRANSURETHRAL RESECTION OF BLADDER TUMOR  07/08/2012   Procedure: TRANSURETHRAL RESECTION OF BLADDER TUMOR (TURBT);  Surgeon: Molli Hazard, MD;  Location: [facility];  Service: Urology;  Laterality: N/A;  CYSTO, TURBT W/ GYRUS, BILATERAL STENT REMOVAL, LEFT URETEROSCOPY WITH BIOPSY, POSSIBLE LEFT STENT PLACEMENT    Social History   Socioeconomic History  . Marital status: Married    Spouse name: Not on file  . Number of children: Not on file  . Years of education: Not on file  . Highest education level: Not on file  Occupational History  . Occupation: retired  Tobacco Use  . Smoking status: Former Smoker    Types: Cigarettes, Cigars    Quit date: 03/02/2004    Years  since quitting: 16.3  . Smokeless tobacco: Former Systems developer    Quit date: 05/20/1999  . Tobacco comment: quit in 2005  Vaping Use  . Vaping Use: Never used  Substance and Sexual Activity  . Alcohol use: No  . Drug use: No  . Sexual activity: Not on file  Other Topics Concern  . Not on file  Social History Narrative  . Not on file   Social Determinants of Health   Financial Resource Strain:   . Difficulty of Paying Living Expenses: Not on file  Food Insecurity:   . Worried About Charity fundraiser in the Last Year: Not on file  . Ran Out of Food in the Last Year: Not on file  Transportation Needs:   . Lack of Transportation (Medical): Not on file  . Lack of Transportation (Non-Medical): Not on file  Physical Activity:   . Days of Exercise per Week: Not on file  . Minutes of Exercise per Session: Not on file  Stress:   . Feeling of Stress : Not on file  Social Connections:   . Frequency of Communication with Friends and Family: Not on file  . Frequency of Social Gatherings with Friends and Family: Not on file  . Attends Religious Services: Not on file  . Active Member of Clubs or Organizations: Not on file  . Attends Archivist Meetings: Not on file  . Marital Status: Not on file   Family History  Problem Relation Age of Onset  . Coronary artery disease Other   . Stroke Other    Allergies  Allergen Reactions  . Ramipril Other (See Comments)    cough  . Penicillins Itching and Rash    Has patient had a PCN reaction causing immediate rash, facial/tongue/throat swelling, SOB or lightheadedness with hypotension: No Has patient had a PCN reaction causing severe rash involving mucus membranes or skin necrosis: Yes-back and hands. Has patient had a PCN reaction that required hospitalization: No Has patient had a PCN reaction occurring within the last 10 years: Yes If all of the above answers are "NO", then may proceed with Cephalosporin use.   Prior to Admission  medications   Medication Sig Start Date End Date Taking? Authorizing Provider  amiodarone (PACERONE) 200 MG tablet Take 0.5 tablets (100 mg total) by mouth daily. 08/05/19  Yes Bensimhon, Shaune Pascal, MD  carvedilol (COREG) 6.25 MG tablet Take 1 tablet (6.25 mg total) by mouth 2 (two) times daily with a meal. 06/05/20  Yes Bensimhon, Shaune Pascal, MD  dapagliflozin propanediol (FARXIGA) 10 MG TABS tablet Take 1 tablet (10 mg total) by mouth daily before breakfast. 05/23/20  Yes Bensimhon, Shaune Pascal, MD  ELIQUIS 2.5 MG TABS tablet TAKE 1 TABLET BY MOUTH TWICE A DAY 04/25/20  Yes Bensimhon,  Shaune Pascal, MD  ezetimibe (ZETIA) 10 MG tablet TAKE ONE TABLET BY MOUTH ONCE DAILY WITH SUPPER 03/06/17  Yes Larey Dresser, MD  ferrous sulfate (CVS IRON) 325 (65 FE) MG tablet Take 325 mg by mouth daily with supper.    Yes [provider]  furosemide (LASIX) 20 MG tablet Take 2 tablets (40 mg total) by mouth daily. 12/11/17  Yes Bensimhon, Shaune Pascal, MD  levothyroxine (SYNTHROID) 75 MCG tablet Take 1 tablet (75 mcg total) by mouth daily before breakfast. 02/15/20  Yes Deboraha Sprang, MD  Multiple Vitamins-Minerals (CENTRUM PO) Take 1 tablet by mouth daily.   Yes [provider]  nitroGLYCERIN (NITROSTAT) 0.4 MG SL tablet DISSOLVE ONE TABLET UNDER THE TONGUE EVERY 5 MINUTES AS NEEDED FOR CHEST PAIN.  DO NOT EXCEED A TOTAL OF 3 DOSES IN 15 MINUTES 12/26/16  Yes Larey Dresser, MD  pantoprazole (PROTONIX) 40 MG tablet Take 40 mg by mouth daily.  01/15/11  Yes [provider]  potassium chloride (K-DUR,KLOR-CON) 10 MEQ tablet Take 1 tablet (10 mEq total) by mouth daily. 12/13/17  Yes Velvet Bathe, MD  rosuvastatin (CRESTOR) 20 MG tablet Take 1 tablet (20 mg total) by mouth daily. 10/18/15  Yes Bensimhon, Shaune Pascal, MD  tamsulosin (FLOMAX) 0.4 MG CAPS capsule Take 0.4 mg by mouth at bedtime.  11/23/13  Yes [provider]     Positive ROS: Otherwise negative  All other systems have been reviewed and  were otherwise negative with the exception of those mentioned in the HPI and as above.  Physical Exam: Constitutional: Alert, well-appearing, no acute distress Ears: External ears without lesions or tenderness.  Right ear canal and TM are clear.  Left ear canal reveals T-tube with active drainage.  This was cleaned and suction and insufflated Ciprodex drops into the middle ear space. Nasal: External nose without lesions. Clear nasal passages Oral: Lips and gums without lesions. Tongue and palate mucosa without lesions. Posterior oropharynx clear. Neck: No palpable adenopathy or masses Respiratory: Breathing comfortably  Skin: No facial/neck lesions or rash noted.  Binocular microscopy  Date/Time: 07/18/2020 5:39 PM Performed by: Rozetta Nunnery, MD Authorized by: Rozetta Nunnery, MD   Consent:    Consent obtained:  Verbal   Consent given by:  Patient   Alternatives discussed:  No treatment Procedure details:    Indications: tympanostomy tube follow-up     Scope location: bilateral     Right speculum size:  3 Cerumen:    Right ear: normal quantity of cerumen     Left ear: normal quantity of cerumen   Right ear canal:    normal   Right tympanic membrane:    Mobility: fully mobile     Perforation: no perforation   Left tympanic membrane:    Effusion: partial effusion   Post-procedure details:    Patient tolerance of procedure:  Tolerated well, no immediate complications Comments:     Left TM with T-tube with drainage from the T-tube that was cleaned with suction and applied Ciprodex eardrops which were insufflated into the middle ear space.    Assessment: Acute left otitis media with drainage from left T-tube.  Plan: Recommended use of ciprofloxacin dexamethasone suspension eardrops 4 to 5 drops in the left ear twice daily for the next 5 days to 1 week.  He will follow-up if he has any persistent drainage that does not resolve with eardrops after 1  week   Radene Journey, MD

## 2020-07-19 ENCOUNTER — Ambulatory Visit (INDEPENDENT_AMBULATORY_CARE_PROVIDER_SITE_OTHER): Payer: Medicare Other

## 2020-07-19 DIAGNOSIS — I255 Ischemic cardiomyopathy: Secondary | ICD-10-CM

## 2020-07-20 LAB — CUP PACEART REMOTE DEVICE CHECK
Battery Remaining Longevity: 4 mo
Battery Remaining Percentage: 4 %
Battery Voltage: 2.62 V
Brady Statistic AP VP Percent: 97 %
Brady Statistic AP VS Percent: 1 %
Brady Statistic AS VP Percent: 2.4 %
Brady Statistic AS VS Percent: 1 %
Brady Statistic RA Percent Paced: 91 %
Date Time Interrogation Session: 20211006020023
HighPow Impedance: 54 Ohm
HighPow Impedance: 54 Ohm
Implantable Lead Implant Date: 20060509
Implantable Lead Implant Date: 20060509
Implantable Lead Implant Date: 20090417
Implantable Lead Location: 753858
Implantable Lead Location: 753859
Implantable Lead Location: 753860
Implantable Lead Model: 5076
Implantable Lead Model: 7001
Implantable Pulse Generator Implant Date: 20141208
Lead Channel Impedance Value: 410 Ohm
Lead Channel Impedance Value: 450 Ohm
Lead Channel Impedance Value: 510 Ohm
Lead Channel Pacing Threshold Amplitude: 0.5 V
Lead Channel Pacing Threshold Amplitude: 0.875 V
Lead Channel Pacing Threshold Amplitude: 1 V
Lead Channel Pacing Threshold Pulse Width: 0.5 ms
Lead Channel Pacing Threshold Pulse Width: 0.8 ms
Lead Channel Pacing Threshold Pulse Width: 0.8 ms
Lead Channel Sensing Intrinsic Amplitude: 12 mV
Lead Channel Sensing Intrinsic Amplitude: 4.7 mV
Lead Channel Setting Pacing Amplitude: 2 V
Lead Channel Setting Pacing Amplitude: 2 V
Lead Channel Setting Pacing Amplitude: 2 V
Lead Channel Setting Pacing Pulse Width: 0.8 ms
Lead Channel Setting Pacing Pulse Width: 0.8 ms
Lead Channel Setting Sensing Sensitivity: 0.5 mV
Pulse Gen Serial Number: 7138995

## 2020-07-24 NOTE — Progress Notes (Signed)
Remote ICD transmission.   

## 2020-08-02 ENCOUNTER — Ambulatory Visit (INDEPENDENT_AMBULATORY_CARE_PROVIDER_SITE_OTHER): Payer: Medicare Other | Admitting: Podiatry

## 2020-08-02 ENCOUNTER — Encounter: Payer: Self-pay | Admitting: Podiatry

## 2020-08-02 ENCOUNTER — Other Ambulatory Visit: Payer: Self-pay

## 2020-08-02 DIAGNOSIS — B351 Tinea unguium: Secondary | ICD-10-CM

## 2020-08-02 DIAGNOSIS — Z9229 Personal history of other drug therapy: Secondary | ICD-10-CM

## 2020-08-02 DIAGNOSIS — M79675 Pain in left toe(s): Secondary | ICD-10-CM

## 2020-08-02 DIAGNOSIS — M79674 Pain in right toe(s): Secondary | ICD-10-CM

## 2020-08-02 NOTE — Progress Notes (Signed)
Subjective: [name] is a 86 y.o. male patient seen today for painful mycotic nails b/l that are difficult to trim. Pain interferes with ambulation. Aggravating factors include wearing enclosed shoe gear. Pain is relieved with periodic professional debridement.   He states his wife has injured her hip and was unable to accompany him on today's visit. He also states his left great toe feels fine now. He voices no new pedal concerns on today's visit.  Patient Active Problem List   Diagnosis Date Noted  . Enterococcal bacteremia 12/08/2017  . Nausea & vomiting 12/06/2017  . Renal insufficiency 12/06/2017  . Acute respiratory failure with hypoxia ([place]) 09/10/2016  . Acute bronchitis with COPD ([place]) 09/10/2016  . CKD (chronic kidney disease) stage 4, GFR 15-29 ml/min (HCC) 04/04/2015  . 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 Urosepsis 08/16/2013  . Bruising 08/12/2012  . Bladder cancer ([place]) 07/08/2012  . UTI (urinary tract infection) 05/27/2012  . Bladder tumor 05/18/2012  . Knee pain, acute, right 02/11/2012  . CAROTID ARTERY DISEASE 12/06/2010  . Essential hypertension, benign 09/22/2009  . Chronic systolic CHF (congestive heart failure) (HCC)-EF 25% 09/14/2009  . CHEST PAIN 08/08/2009  . Mixed hyperlipidemia 05/25/2009  . CAD, NATIVE VESSEL 02/01/2009  . Ischemic cardiomyopathy 01/24/2009  . Atrial fibrillation ([place]) 01/21/2009  . 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 amiodarone (PACERONE) 200 MG tablet Take 0.5 tablets (100 mg total) by mouth daily. 30 tablet 11  . carvedilol (COREG) 6.25 MG tablet Take 1 tablet (6.25 mg total) by mouth 2 (two) times daily with a meal. 60 tablet 3  . dapagliflozin propanediol (FARXIGA) 10 MG TABS tablet Take 1 tablet (10 mg total) by mouth daily before breakfast.  90 tablet 3  . ELIQUIS 2.5 MG TABS tablet TAKE 1 TABLET BY MOUTH TWICE A DAY 180 tablet 3  . ezetimibe (ZETIA) 10 MG tablet TAKE ONE TABLET BY MOUTH ONCE DAILY WITH SUPPER 90 tablet 3  . ferrous sulfate ([employer]S IRON) 325 (65 FE) MG tablet Take 325 mg by mouth daily with supper.     . furosemide (LASIX) 20 MG tablet Take 2 tablets (40 mg total) by mouth daily. 180 tablet 2  . levothyroxine (SYNTHROID) 75 MCG tablet Take 1 tablet (75 mcg total) by mouth daily before breakfast. 30 tablet 11  . Multiple Vitamins-Minerals (CENTRUM PO) Take 1 tablet by mouth daily.    . nitroGLYCERIN (NITROSTAT) 0.4 MG SL tablet DISSOLVE ONE TABLET UNDER THE TONGUE EVERY 5 MINUTES AS NEEDED FOR CHEST PAIN.  DO NOT EXCEED A TOTAL OF 3 DOSES IN 15 MINUTES 25 tablet 0  . pantoprazole (PROTONIX) 40 MG tablet Take 40 mg by mouth daily.     . potassium chloride (K-DUR,KLOR-CON) 10 MEQ tablet Take 1 tablet (10 mEq total) by mouth daily. 30 tablet 0  . rosuvastatin (CRESTOR) 20 MG tablet Take 1 tablet (20 mg total) by mouth daily. 90 tablet 3  . tamsulosin (FLOMAX) 0.4 MG CAPS capsule Take 0.4 mg by mouth at bedtime.      No current facility-administered medications on file prior to visit.    Allergies  Allergen Reactions  . Ramipril Other (See Comments)    cough  . Penicillins Itching and Rash    Has patient had a PCN reaction causing immediate rash, facial/tongue/throat swelling, SOB or lightheadedness with  hypotension: No Has patient had a PCN reaction causing severe rash involving mucus membranes or skin necrosis: Yes-back and hands. Has patient had a PCN reaction that required hospitalization: No Has patient had a PCN reaction occurring within the last 10 years: Yes If all of the above answers are "NO", then may proceed with Cephalosporin use.   Objective: Physical Exam  General: Patient is a pleasant 86 y.o. Caucasian male in no acute distress, Awake, alert and oriented x 3  Neurovascular status unchanged b/l.   Neurovascular status unchanged b/l lower extremities. Capillary fill time to digits <3 seconds b/l lower extremities. Palpable DP pulses b/l. Palpable PT pulses b/l. Pedal hair absent b/l. Skin temperature gradient within normal limits b/l. No pain with calf compression b/l. No edema noted b/l.   Protective sensation intact 5/5 intact bilaterally with 10g monofilament b/l. Vibratory sensation intact b/l. Proprioception intact bilaterally.  Dermatological:  Pedal skin with normal turgor, texture and tone bilaterally. No open wounds bilaterally. No interdigital macerations bilaterally. Toenails 1-5 b/l elongated, discolored, dystrophic, thickened, crumbly with subungual debris and tenderness to dorsal palpation.  Musculoskeletal:  Normal muscle strength 5/5 to all lower extremity muscle groups bilaterally. No pain crepitus or joint limitation noted with ROM b/l. Hallux valgus with bunion deformity noted b/l lower extremities.  Assessment and Plan:  1. Pain due to onychomycosis of toenails of both feet   2. Hx of long term use of blood thinners    -Examined patient. -Toenails 1-5 b/l were debrided in length and girth with sterile nail nippers and dremel without iatrogenic bleeding.  -Patient to report any pedal injuries to medical professional immediately. -Patient to continue soft, supportive shoe gear daily. -Patient/POA to call should there be question/concern in the interim.  Return in about 3 months (around 11/02/2020).  Marzetta Board, DPM

## 2020-08-03 DIAGNOSIS — D485 Neoplasm of uncertain behavior of skin: Secondary | ICD-10-CM | POA: Diagnosis not present

## 2020-08-03 DIAGNOSIS — Z85828 Personal history of other malignant neoplasm of skin: Secondary | ICD-10-CM | POA: Diagnosis not present

## 2020-08-03 DIAGNOSIS — C44622 Squamous cell carcinoma of skin of right upper limb, including shoulder: Secondary | ICD-10-CM | POA: Diagnosis not present

## 2020-08-12 DIAGNOSIS — Z23 Encounter for immunization: Secondary | ICD-10-CM | POA: Diagnosis not present

## 2020-08-23 ENCOUNTER — Other Ambulatory Visit (HOSPITAL_COMMUNITY): Payer: Self-pay

## 2020-08-23 MED ORDER — DAPAGLIFLOZIN PROPANEDIOL 10 MG PO TABS: 10.0000 mg | ORAL_TABLET | Freq: Every day | ORAL | 3 refills | Status: DC

## 2020-08-25 DIAGNOSIS — E785 Hyperlipidemia, unspecified: Secondary | ICD-10-CM | POA: Diagnosis not present

## 2020-08-25 DIAGNOSIS — E039 Hypothyroidism, unspecified: Secondary | ICD-10-CM | POA: Diagnosis not present

## 2020-08-25 DIAGNOSIS — Z125 Encounter for screening for malignant neoplasm of prostate: Secondary | ICD-10-CM | POA: Diagnosis not present

## 2020-08-25 DIAGNOSIS — R739 Hyperglycemia, unspecified: Secondary | ICD-10-CM | POA: Diagnosis not present

## 2020-09-01 DIAGNOSIS — I5022 Chronic systolic (congestive) heart failure: Secondary | ICD-10-CM | POA: Diagnosis not present

## 2020-09-01 DIAGNOSIS — N184 Chronic kidney disease, stage 4 (severe): Secondary | ICD-10-CM | POA: Diagnosis not present

## 2020-09-01 DIAGNOSIS — I129 Hypertensive chronic kidney disease with stage 1 through stage 4 chronic kidney disease, or unspecified chronic kidney disease: Secondary | ICD-10-CM | POA: Diagnosis not present

## 2020-09-01 DIAGNOSIS — I2510 Atherosclerotic heart disease of native coronary artery without angina pectoris: Secondary | ICD-10-CM | POA: Diagnosis not present

## 2020-09-01 DIAGNOSIS — I130 Hypertensive heart and chronic kidney disease with heart failure and stage 1 through stage 4 chronic kidney disease, or unspecified chronic kidney disease: Secondary | ICD-10-CM | POA: Diagnosis not present

## 2020-09-01 DIAGNOSIS — I6529 Occlusion and stenosis of unspecified carotid artery: Secondary | ICD-10-CM | POA: Diagnosis not present

## 2020-09-01 DIAGNOSIS — I509 Heart failure, unspecified: Secondary | ICD-10-CM | POA: Diagnosis not present

## 2020-09-01 DIAGNOSIS — Z95810 Presence of automatic (implantable) cardiac defibrillator: Secondary | ICD-10-CM | POA: Diagnosis not present

## 2020-09-01 DIAGNOSIS — R82998 Other abnormal findings in urine: Secondary | ICD-10-CM | POA: Diagnosis not present

## 2020-09-01 DIAGNOSIS — Z0000 Encounter for general adult medical examination without abnormal findings: Secondary | ICD-10-CM | POA: Diagnosis not present

## 2020-09-01 DIAGNOSIS — C679 Malignant neoplasm of bladder, unspecified: Secondary | ICD-10-CM | POA: Diagnosis not present

## 2020-09-01 DIAGNOSIS — I480 Paroxysmal atrial fibrillation: Secondary | ICD-10-CM | POA: Diagnosis not present

## 2020-09-01 DIAGNOSIS — I10 Essential (primary) hypertension: Secondary | ICD-10-CM | POA: Diagnosis not present

## 2020-09-01 DIAGNOSIS — I472 Ventricular tachycardia: Secondary | ICD-10-CM | POA: Diagnosis not present

## 2020-09-01 DIAGNOSIS — I255 Ischemic cardiomyopathy: Secondary | ICD-10-CM | POA: Diagnosis not present

## 2020-09-01 DIAGNOSIS — D472 Monoclonal gammopathy: Secondary | ICD-10-CM | POA: Diagnosis not present

## 2020-09-01 DIAGNOSIS — J449 Chronic obstructive pulmonary disease, unspecified: Secondary | ICD-10-CM | POA: Diagnosis not present

## 2020-09-06 ENCOUNTER — Ambulatory Visit (INDEPENDENT_AMBULATORY_CARE_PROVIDER_SITE_OTHER): Payer: Medicare Other | Admitting: Internal Medicine

## 2020-09-06 ENCOUNTER — Other Ambulatory Visit: Payer: Self-pay

## 2020-09-06 ENCOUNTER — Encounter: Payer: Self-pay | Admitting: Internal Medicine

## 2020-09-06 VITALS — BP 114/62 | HR 60 | Ht 70.0 in | Wt 190.0 lb

## 2020-09-06 DIAGNOSIS — I4891 Unspecified atrial fibrillation: Secondary | ICD-10-CM | POA: Diagnosis not present

## 2020-09-06 DIAGNOSIS — Z95810 Presence of automatic (implantable) cardiac defibrillator: Secondary | ICD-10-CM

## 2020-09-06 DIAGNOSIS — I5022 Chronic systolic (congestive) heart failure: Secondary | ICD-10-CM

## 2020-09-06 DIAGNOSIS — I255 Ischemic cardiomyopathy: Secondary | ICD-10-CM | POA: Diagnosis not present

## 2020-09-06 LAB — CUP PACEART INCLINIC DEVICE CHECK
Battery Remaining Longevity: 2 mo
Brady Statistic RA Percent Paced: 92 %
Brady Statistic RV Percent Paced: 97 %
Date Time Interrogation Session: 20211124133010
HighPow Impedance: 52.447
Implantable Lead Implant Date: 20060509
Implantable Lead Implant Date: 20060509
Implantable Lead Implant Date: 20090417
Implantable Lead Location: 753858
Implantable Lead Location: 753859
Implantable Lead Location: 753860
Implantable Lead Model: 5076
Implantable Lead Model: 7001
Implantable Pulse Generator Implant Date: 20141208
Lead Channel Impedance Value: 437.5 Ohm
Lead Channel Impedance Value: 462.5 Ohm
Lead Channel Impedance Value: 487.5 Ohm
Lead Channel Pacing Threshold Amplitude: 0.75 V
Lead Channel Pacing Threshold Amplitude: 0.875 V
Lead Channel Pacing Threshold Amplitude: 1 V
Lead Channel Pacing Threshold Pulse Width: 0.5 ms
Lead Channel Pacing Threshold Pulse Width: 0.8 ms
Lead Channel Pacing Threshold Pulse Width: 0.8 ms
Lead Channel Sensing Intrinsic Amplitude: 12 mV
Lead Channel Sensing Intrinsic Amplitude: 5 mV
Lead Channel Setting Pacing Amplitude: 2 V
Lead Channel Setting Pacing Amplitude: 2 V
Lead Channel Setting Pacing Amplitude: 2 V
Lead Channel Setting Pacing Pulse Width: 0.8 ms
Lead Channel Setting Pacing Pulse Width: 0.8 ms
Lead Channel Setting Sensing Sensitivity: 0.5 mV
Pulse Gen Serial Number: 7138995

## 2020-09-06 NOTE — Patient Instructions (Signed)
Medication Instructions:  Your physician recommends that you continue on your current medications as directed. Please refer to the Current Medication list given to you today.  *If you need a refill on your cardiac medications before your next appointment, please call your pharmacy*   Lab Work: None  If you have labs (blood work) drawn today and your tests are completely normal, you will receive your results only by: Marland Kitchen MyChart Message (if you have MyChart) OR . A paper copy in the mail If you have any lab test that is abnormal or we need to change your treatment, we will call you to review the results.   Testing/Procedures: None   Follow-Up: Remote monitoring is used to monitor your ICD from home. This monitoring reduces the number of office visits required to check your device to one time per year. It allows [place] to keep an eye on the functioning of your device to ensure it is working properly. You are scheduled for a device check from home on 09/18/20. You may send your transmission at any time that day. If you have a wireless device, the transmission will be sent automatically. After your physician reviews your transmission, you will receive a postcard with your next transmission date.    At [facility], you and your health needs are our priority.  As part of our continuing mission to provide you with exceptional heart care, we have created designated Provider Care Teams.  These Care Teams include your primary Cardiologist (physician) and Advanced Practice Providers (APPs -  Physician Assistants and Nurse Practitioners) who all work together to provide you with the care you need, when you need it.  We recommend signing up for the patient portal called "MyChart".  Sign up information is provided on this After Visit Summary.  MyChart is used to connect with patients for Virtual Visits (Telemedicine).  Patients are able to view lab/test results, encounter notes, upcoming appointments, etc.   Non-urgent messages can be sent to your provider as well.   To learn more about what you can do with MyChart, go to [email or web address].    Your next appointment:   12 month(s)  The format for your next appointment:   In Person  Provider:   You may see Jolyn Nap, MD or one of the following Advanced Practice Providers on your designated Care Team:    Chanetta Marshall, NP  Tommye Standard, [place]  Legrand Como "Jonni Sanger" [place], [place]  -   Other Instructions None

## 2020-09-06 NOTE — Progress Notes (Signed)
Patient Care Team: [name], MD as PCP - General (Internal Medicine) Bensimhon, Shaune Pascal, MD as Consulting Physician (Cardiology)   HPI  [name] is a 86 y.o. male seen in followup for ischemic heart myopathy congestive heart failure and prior CRT-D with generator replaced 12/14    Recurrent  atrial fibrillation. Managed with amiodarone -- had problems with neurotoxicity with gait instability and his amiodarone  decreased>>and then stopped 2/2 tremor  Resumed 2020 for recurrent afib and has tolerated   Patient denies symptoms of GI intolerance, sun sensitivity, neurological symptoms attributable to amiodarone.     No exertional chest pain.  Mild dyspnea.  Not limiting.  No edema.  No nocturnal dyspnea  Date Cr K Hgb TSH LFT  4/17 2.5        6/19    1.22 103  2/20 2.49 3.6 13.1    11/20 2.2  12.6 1.39 10   6/21 2.49 4.5 12.8 0.757    Echo 12/14 LVEF 20-25% Carotid u/s 3/14: 40-59% R 0-39%%L Echo 11/2017: EF 25%     Past Medical History:  Diagnosis Date  . Acute bronchitis with COPD ([place]) 09/10/2016  . Anemia   . Atrial fibrillation or flutter    maintaining sinus on amiodarone    . Bladder cancer ([place]) dx'd 05/2012  . BPH (benign prostatic hyperplasia)   . CAD (coronary artery disease)     a. s/p anterior MI 12/05 c/b shock -> stent LAD   b. s/p stenting OM-1, 2/06  . CHF (congestive heart failure) (HCC)    due to ischemic CM  a. EF 20-30%. (Nov 2008)   b. s/p St. Jude BiV-ICD    c. CPX 07/2008  pvo2 16.3 (63% predicted) slope 34 RER 1.08 O2 pulse 93%  . Cough    occasional /productive, no fever/ not new  . CRI (chronic renal insufficiency)    (baseline 2.0-2.2)/ recent hospitalization 8/13  . GERD (gastroesophageal reflux disease)    hx Barretts esophagitis  . History of blood transfusion    following coumadin  usage  . HTN (hypertension)    EKG 05/14/12,Chest x ray 8/13, last ICD interrogation 8/13 EPIC  . Hyperlipidemia   . Hypothyroidism    . 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 Skin cancer    basal cells   facial x 4, 1 right forearm  . Sleep apnea    STOP BANG SCORE 4    Past Surgical History:  Procedure Laterality Date  . [place]   lower back  . CARDIAC DEFIBRILLATOR PLACEMENT     ICD St Jude; gen change 09-20-13  . CERVICAL LAMINECTOMY  1985  . COLONOSCOPY    . CORONARY ANGIOPLASTY  2005,2006  . CYSTOSCOPY W/ RETROGRADES  05/25/2012   Procedure: CYSTOSCOPY WITH RETROGRADE PYELOGRAM;  Surgeon: Molli Hazard, MD;  Location: [facility];  Service: Urology;  Laterality: Bilateral;  . CYSTOSCOPY W/ URETERAL STENT PLACEMENT  07/08/2012   Procedure: CYSTOSCOPY WITH STENT REPLACEMENT;  Surgeon: Molli Hazard, MD;  Location: [facility];  Service: Urology;  Laterality: Left;  . CYSTOSCOPY W/ URETERAL STENT REMOVAL  07/08/2012   Procedure: CYSTOSCOPY WITH STENT REMOVAL;  Surgeon: Molli Hazard, MD;  [facility]: [facility];  Service: Urology;  Laterality: Bilateral;  . CYSTOSCOPY/RETROGRADE/URETEROSCOPY  07/08/2012   Procedure: CYSTOSCOPY/RETROGRADE/URETEROSCOPY;  Surgeon: Molli Hazard, MD;  [facility];  Service: Urology;  Laterality: Left;  . ESOPHAGOGASTRODUODENOSCOPY    . HERNIA REPAIR  1989   bilateral  . IMPLANTABLE CARDIOVERTER DEFIBRILLATOR (ICD) GENERATOR CHANGE N/A 09/20/2013   Procedure: ICD GENERATOR CHANGE;  Surgeon: Deboraha Sprang, MD;  Location: [facility] CATH LAB;  Service: Cardiovascular;  Laterality: N/A;  . TEE WITHOUT CARDIOVERSION N/A 12/10/2017   Procedure: TRANSESOPHAGEAL ECHOCARDIOGRAM (TEE);  Surgeon: Jerline Pain, MD;  Location[facility]OSCOPY;  Service: Cardiovascular;  Laterality: N/A;  . TRANSURETHRAL RESECTION OF BLADDER TUMOR  05/25/2012   cold cup biopsy prostate  . TRANSURETHRAL RESECTION OF BLADDER TUMOR  07/08/2012   Procedure: TRANSURETHRAL  RESECTION OF BLADDER TUMOR (TURBT);  Surgeon: Molli Hazard, MD;  Location: [facility];  Service: Urology;  Laterality: N/A;  CYSTO, TURBT W/ GYRUS, BILATERAL STENT REMOVAL, LEFT URETEROSCOPY WITH BIOPSY, POSSIBLE LEFT STENT PLACEMENT     Current Outpatient Medications  Medication Sig Dispense Refill  . amiodarone (PACERONE) 200 MG tablet Take 0.5 tablets (100 mg total) by mouth daily. 30 tablet 11  . carvedilol (COREG) 6.25 MG tablet Take 1 tablet (6.25 mg total) by mouth 2 (two) times daily with a meal. 60 tablet 3  . dapagliflozin propanediol (FARXIGA) 10 MG TABS tablet Take 1 tablet (10 mg total) by mouth daily before breakfast. 90 tablet 3  . ELIQUIS 2.5 MG TABS tablet TAKE 1 TABLET BY MOUTH TWICE A DAY 180 tablet 3  . ezetimibe (ZETIA) 10 MG tablet TAKE ONE TABLET BY MOUTH ONCE DAILY WITH SUPPER 90 tablet 3  . ferrous sulfate ([employer] IRON) 325 (65 FE) MG tablet Take 325 mg by mouth daily with supper.     . furosemide (LASIX) 20 MG tablet Take 2 tablets (40 mg total) by mouth daily. 180 tablet 2  . levothyroxine (SYNTHROID) 75 MCG tablet Take 1 tablet (75 mcg total) by mouth daily before breakfast. 30 tablet 11  . Multiple Vitamins-Minerals (CENTRUM PO) Take 1 tablet by mouth daily.    . nitroGLYCERIN (NITROSTAT) 0.4 MG SL tablet DISSOLVE ONE TABLET UNDER THE TONGUE EVERY 5 MINUTES AS NEEDED FOR CHEST PAIN.  DO NOT EXCEED A TOTAL OF 3 DOSES IN 15 MINUTES 25 tablet 0  . pantoprazole (PROTONIX) 40 MG tablet Take 40 mg by mouth daily.     . potassium chloride (K-DUR,KLOR-CON) 10 MEQ tablet Take 1 tablet (10 mEq total) by mouth daily. 30 tablet 0  . rosuvastatin (CRESTOR) 20 MG tablet Take 1 tablet (20 mg total) by mouth daily. 90 tablet 3  . tamsulosin (FLOMAX) 0.4 MG CAPS capsule Take 0.4 mg by mouth at bedtime.      No current facility-administered medications for this visit.    Allergies  Allergen Reactions  . Ramipril Other (See Comments)    cough  . Penicillins Itching and Rash     Has patient had a PCN reaction causing immediate rash, facial/tongue/throat swelling, SOB or lightheadedness with hypotension: No Has patient had a PCN reaction causing severe rash involving mucus membranes or skin necrosis: Yes-back and hands. Has patient had a PCN reaction that required hospitalization: No Has patient had a PCN reaction occurring within the last 10 years: Yes If all of the above answers are "NO", then may proceed with Cephalosporin use.    Review of Systems negative except from HPI and PMH  Physical Exam BP 114/62   Pulse 60   Ht 5\' 10"  (1.778 m)   Wt 190 lb (  86.2 kg)   SpO2 99%   BMI 27.26 kg/m  Well developed and well nourished in no acute distress HENT normal Neck supple with JVP-flat Clear Device pocket well healed; without hematoma or erythema.  There is no tethering  Regular rate and rhythm, no  murmur Abd-soft with active BS No Clubbing cyanosis   edema Skin-warm and dry A & Oriented  Grossly normal sensory and motor function  ECG AV pacing with an upright QRS lead V1 and negative QRS lead I Assessment and  Plan  Ischemic cardiomyopathy  CRT-d  St Jude  Device approaching ERI  Atrial fibrillation   High Risk Medication Surveillance-amiodarone  Heart failure-chronic systolic  Renal insufficiency grade 3-4    Euvolemic continue current meds  Without symptoms of ischemia  Worsening renal insufficiency.  Tolerating amiodarone.  Surveillance laboratories ordered.  No bleeding on anticoagulation appropriately dosed Eliquis.  Lengthy discussion broached the issue of genera we should change out his high-voltage device for low voltage device.  We will continue to think about this together.

## 2020-09-06 NOTE — H&P (View-Only) (Signed)
Patient Care Team: [name], MD as PCP - General (Internal Medicine) Bensimhon, Shaune Pascal, MD as Consulting Physician (Cardiology)   HPI  [name] is a 86 y.o. male seen in followup for ischemic heart myopathy congestive heart failure and prior CRT-D with generator replaced 12/14    Recurrent  atrial fibrillation. Managed with amiodarone -- had problems with neurotoxicity with gait instability and his amiodarone  decreased>>and then stopped 2/2 tremor  Resumed 2020 for recurrent afib and has tolerated   Patient denies symptoms of GI intolerance, sun sensitivity, neurological symptoms attributable to amiodarone.     No exertional chest pain.  Mild dyspnea.  Not limiting.  No edema.  No nocturnal dyspnea  Date Cr K Hgb TSH LFT  4/17 2.5        6/19    1.22 103  2/20 2.49 3.6 13.1    11/20 2.2  12.6 1.39 10   6/21 2.49 4.5 12.8 0.757    Echo 12/14 LVEF 20-25% Carotid u/s 3/14: 40-59% R 0-39%%L Echo 11/2017: EF 25%     Past Medical History:  Diagnosis Date  . Acute bronchitis with COPD ([place]) 09/10/2016  . Anemia   . Atrial fibrillation or flutter    maintaining sinus on amiodarone    . Bladder cancer ([place]) dx'd 05/2012  . BPH (benign prostatic hyperplasia)   . CAD (coronary artery disease)     a. s/p anterior MI 12/05 c/b shock -> stent LAD   b. s/p stenting OM-1, 2/06  . CHF (congestive heart failure) (HCC)    due to ischemic CM  a. EF 20-30%. (Nov 2008)   b. s/p St. Jude BiV-ICD    c. CPX 07/2008  pvo2 16.3 (63% predicted) slope 34 RER 1.08 O2 pulse 93%  . Cough    occasional /productive, no fever/ not new  . CRI (chronic renal insufficiency)    (baseline 2.0-2.2)/ recent hospitalization 8/13  . GERD (gastroesophageal reflux disease)    hx Barretts esophagitis  . History of blood transfusion    following coumadin  usage  . HTN (hypertension)    EKG 05/14/12,Chest x ray 8/13, last ICD interrogation 8/13 EPIC  . Hyperlipidemia   . Hypothyroidism    . 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 Skin cancer    basal cells   facial x 4, 1 right forearm  . Sleep apnea    STOP BANG SCORE 4    Past Surgical History:  Procedure Laterality Date  . [place]   lower back  . CARDIAC DEFIBRILLATOR PLACEMENT     ICD St Jude; gen change 09-20-13  . CERVICAL LAMINECTOMY  1985  . COLONOSCOPY    . CORONARY ANGIOPLASTY  2005,2006  . CYSTOSCOPY W/ RETROGRADES  05/25/2012   Procedure: CYSTOSCOPY WITH RETROGRADE PYELOGRAM;  Surgeon: Molli Hazard, MD;  [facility];  Service: Urology;  Laterality: Bilateral;  . CYSTOSCOPY W/ URETERAL STENT PLACEMENT  07/08/2012   Procedure: CYSTOSCOPY WITH STENT REPLACEMENT;  Surgeon: Molli Hazard, MD;  [facility];  Service: Urology;  Laterality: Left;  . CYSTOSCOPY W/ URETERAL STENT REMOVAL  07/08/2012   Procedure: CYSTOSCOPY WITH STENT REMOVAL;  Surgeon: Molli Hazard, MD;  [facility];  Service: Urology;  Laterality: Bilateral;  . CYSTOSCOPY/RETROGRADE/URETEROSCOPY  07/08/2012   Procedure: CYSTOSCOPY/RETROGRADE/URETEROSCOPY;  Surgeon: Molli Hazard, MD;  [facility];  Service: Urology;  Laterality: Left;  . ESOPHAGOGASTRODUODENOSCOPY    . HERNIA REPAIR  1989   bilateral  . IMPLANTABLE CARDIOVERTER DEFIBRILLATOR (ICD) GENERATOR CHANGE N/A 09/20/2013   Procedure: ICD GENERATOR CHANGE;  Surgeon: Deboraha Sprang, MD;  Location: [facility] CATH LAB;  Service: Cardiovascular;  Laterality: N/A;  . TEE WITHOUT CARDIOVERSION N/A 12/10/2017   Procedure: TRANSESOPHAGEAL ECHOCARDIOGRAM (TEE);  Surgeon: Jerline Pain, MD;  Location: [facility] ENDOSCOPY;  Service: Cardiovascular;  Laterality: N/A;  . TRANSURETHRAL RESECTION OF BLADDER TUMOR  05/25/2012   cold cup biopsy prostate  . TRANSURETHRAL RESECTION OF BLADDER TUMOR  07/08/2012   Procedure: TRANSURETHRAL  RESECTION OF BLADDER TUMOR (TURBT);  Surgeon: Molli Hazard, MD;  Location: [facility];  Service: Urology;  Laterality: N/A;  CYSTO, TURBT W/ GYRUS, BILATERAL STENT REMOVAL, LEFT URETEROSCOPY WITH BIOPSY, POSSIBLE LEFT STENT PLACEMENT     Current Outpatient Medications  Medication Sig Dispense Refill  . amiodarone (PACERONE) 200 MG tablet Take 0.5 tablets (100 mg total) by mouth daily. 30 tablet 11  . carvedilol (COREG) 6.25 MG tablet Take 1 tablet (6.25 mg total) by mouth 2 (two) times daily with a meal. 60 tablet 3  . dapagliflozin propanediol (FARXIGA) 10 MG TABS tablet Take 1 tablet (10 mg total) by mouth daily before breakfast. 90 tablet 3  . ELIQUIS 2.5 MG TABS tablet TAKE 1 TABLET BY MOUTH TWICE A DAY 180 tablet 3  . ezetimibe (ZETIA) 10 MG tablet TAKE ONE TABLET BY MOUTH ONCE DAILY WITH SUPPER 90 tablet 3  . ferrous sulfate ([employer]S IRON) 325 (65 FE) MG tablet Take 325 mg by mouth daily with supper.     . furosemide (LASIX) 20 MG tablet Take 2 tablets (40 mg total) by mouth daily. 180 tablet 2  . levothyroxine (SYNTHROID) 75 MCG tablet Take 1 tablet (75 mcg total) by mouth daily before breakfast. 30 tablet 11  . Multiple Vitamins-Minerals (CENTRUM PO) Take 1 tablet by mouth daily.    . nitroGLYCERIN (NITROSTAT) 0.4 MG SL tablet DISSOLVE ONE TABLET UNDER THE TONGUE EVERY 5 MINUTES AS NEEDED FOR CHEST PAIN.  DO NOT EXCEED A TOTAL OF 3 DOSES IN 15 MINUTES 25 tablet 0  . pantoprazole (PROTONIX) 40 MG tablet Take 40 mg by mouth daily.     . potassium chloride (K-DUR,KLOR-CON) 10 MEQ tablet Take 1 tablet (10 mEq total) by mouth daily. 30 tablet 0  . rosuvastatin (CRESTOR) 20 MG tablet Take 1 tablet (20 mg total) by mouth daily. 90 tablet 3  . tamsulosin (FLOMAX) 0.4 MG CAPS capsule Take 0.4 mg by mouth at bedtime.      No current facility-administered medications for this visit.    Allergies  Allergen Reactions  . Ramipril Other (See Comments)    cough  . Penicillins Itching and Rash     Has patient had a PCN reaction causing immediate rash, facial/tongue/throat swelling, SOB or lightheadedness with hypotension: No Has patient had a PCN reaction causing severe rash involving mucus membranes or skin necrosis: Yes-back and hands. Has patient had a PCN reaction that required hospitalization: No Has patient had a PCN reaction occurring within the last 10 years: Yes If all of the above answers are "NO", then may proceed with Cephalosporin use.    Review of Systems negative except from HPI and PMH  Physical Exam BP 114/62   Pulse 60   Ht 5\' 10"  (1.778 m)   Wt 190 lb (  86.2 kg)   SpO2 99%   BMI 27.26 kg/m  Well developed and well nourished in no acute distress HENT normal Neck supple with JVP-flat Clear Device pocket well healed; without hematoma or erythema.  There is no tethering  Regular rate and rhythm, no  murmur Abd-soft with active BS No Clubbing cyanosis   edema Skin-warm and dry A & Oriented  Grossly normal sensory and motor function  ECG AV pacing with an upright QRS lead V1 and negative QRS lead I Assessment and  Plan  Ischemic cardiomyopathy  CRT-d  St Jude  Device approaching ERI  Atrial fibrillation   High Risk Medication Surveillance-amiodarone  Heart failure-chronic systolic  Renal insufficiency grade 3-4    Euvolemic continue current meds  Without symptoms of ischemia  Worsening renal insufficiency.  Tolerating amiodarone.  Surveillance laboratories ordered.  No bleeding on anticoagulation appropriately dosed Eliquis.  Lengthy discussion broached the issue of genera we should change out his high-voltage device for low voltage device.  We will continue to think about this together.

## 2020-09-11 ENCOUNTER — Other Ambulatory Visit (HOSPITAL_COMMUNITY): Payer: Self-pay | Admitting: Internal Medicine

## 2020-09-13 ENCOUNTER — Telehealth: Payer: Self-pay | Admitting: Emergency Medicine

## 2020-09-13 DIAGNOSIS — I255 Ischemic cardiomyopathy: Secondary | ICD-10-CM

## 2020-09-13 DIAGNOSIS — N289 Disorder of kidney and ureter, unspecified: Secondary | ICD-10-CM

## 2020-09-13 DIAGNOSIS — I5022 Chronic systolic (congestive) heart failure: Secondary | ICD-10-CM

## 2020-09-13 DIAGNOSIS — Z01812 Encounter for preprocedural laboratory examination: Secondary | ICD-10-CM

## 2020-09-13 NOTE — Telephone Encounter (Signed)
LMOM per DPR that battery @ ERI and will receive call from Dr Caryl Comes or Rosann Auerbach to schedule gen change. DC # and hours provided for questions.

## 2020-09-18 ENCOUNTER — Ambulatory Visit (INDEPENDENT_AMBULATORY_CARE_PROVIDER_SITE_OTHER): Payer: Medicare Other

## 2020-09-18 DIAGNOSIS — I255 Ischemic cardiomyopathy: Secondary | ICD-10-CM

## 2020-09-19 NOTE — Telephone Encounter (Signed)
Spoke with pt's wife, Romie Minus, [place] re: scheduling generator change for pt's ICD as she states pt is currently outside.  Pt's wife would like first available appointment scheduled for pt.  Advised this date would be 10/02/2020.  Pt's wife agreeable to appointment date.  Will contact pt with instructions once procedure is scheduled.  Pt's wife verbalizes understanding and agrees with current plan.

## 2020-09-19 NOTE — Telephone Encounter (Signed)
° ° °  Pt is wife returning Marsha's call, she said she received a VM from [place] and she is returning her call

## 2020-09-19 NOTE — Telephone Encounter (Signed)
Spoke with pt's wife,DPR and reviewed pt's instruction letter for device generator change scheduled for 10/02/2020.   See letter for complete instructions.  Pt's wife verbalizes understanding and agrees with current plan.

## 2020-09-20 ENCOUNTER — Telehealth: Payer: Self-pay | Admitting: Internal Medicine

## 2020-09-20 LAB — CUP PACEART REMOTE DEVICE CHECK
Battery Remaining Longevity: 0 mo
Battery Voltage: 2.59 V
Brady Statistic AP VP Percent: 97 %
Brady Statistic AP VS Percent: 1 %
Brady Statistic AS VP Percent: 2.9 %
Brady Statistic AS VS Percent: 1 %
Brady Statistic RA Percent Paced: 91 %
Date Time Interrogation Session: 20211206020013
HighPow Impedance: 50 Ohm
HighPow Impedance: 50 Ohm
Implantable Lead Implant Date: 20060509
Implantable Lead Implant Date: 20060509
Implantable Lead Implant Date: 20090417
Implantable Lead Location: 753858
Implantable Lead Location: 753859
Implantable Lead Location: 753860
Implantable Lead Model: 5076
Implantable Lead Model: 7001
Implantable Pulse Generator Implant Date: 20141208
Lead Channel Impedance Value: 360 Ohm
Lead Channel Impedance Value: 390 Ohm
Lead Channel Impedance Value: 440 Ohm
Lead Channel Pacing Threshold Amplitude: 0.75 V
Lead Channel Pacing Threshold Amplitude: 1 V
Lead Channel Pacing Threshold Amplitude: 1.25 V
Lead Channel Pacing Threshold Pulse Width: 0.5 ms
Lead Channel Pacing Threshold Pulse Width: 0.8 ms
Lead Channel Pacing Threshold Pulse Width: 0.8 ms
Lead Channel Sensing Intrinsic Amplitude: 12 mV
Lead Channel Sensing Intrinsic Amplitude: 3.7 mV
Lead Channel Setting Pacing Amplitude: 2 V
Lead Channel Setting Pacing Amplitude: 2 V
Lead Channel Setting Pacing Amplitude: 2.25 V
Lead Channel Setting Pacing Pulse Width: 0.8 ms
Lead Channel Setting Pacing Pulse Width: 0.8 ms
Lead Channel Setting Sensing Sensitivity: 0.5 mV
Pulse Gen Serial Number: 7138995

## 2020-09-20 NOTE — Telephone Encounter (Signed)
Patient's wife is following up regarding COVID test scheduled for 09/29/20. She would like to know if the patient can have his test at a different location and if so, please provide recommendations. She is looking for a location that is closer to the [place]. Are we able to assist? Please advise.

## 2020-09-20 NOTE — Telephone Encounter (Signed)
Patients wife is inquiring if patient can have COVID screening done in office instead of at the test site. Informed Ms. Lapinsky that at this time the test site is the only place to get pre procedural screening done. She verbalized understanding.

## 2020-09-26 ENCOUNTER — Telehealth: Payer: Self-pay | Admitting: Internal Medicine

## 2020-09-26 NOTE — Telephone Encounter (Signed)
Patient's wife states she needs to speak with the nurse about the patient's upcoming procedure with Dr. Caryl Comes.

## 2020-09-26 NOTE — Telephone Encounter (Signed)
Spoke with pt's wife, DPR to review pt's instruction letter for generator change scheduled for 10/02/2020.  See Letter for complete instructions.  Pt's wife verbalizes understanding and agrees with current plan.

## 2020-09-27 ENCOUNTER — Other Ambulatory Visit: Payer: Self-pay | Admitting: Internal Medicine

## 2020-09-27 DIAGNOSIS — I480 Paroxysmal atrial fibrillation: Secondary | ICD-10-CM

## 2020-09-27 NOTE — Progress Notes (Signed)
Remote ICD transmission.   

## 2020-09-27 NOTE — Addendum Note (Signed)
Addended by: Cheri [place] A on: 09/27/2020 10:03 AM   Modules accepted: Level of Service

## 2020-09-28 ENCOUNTER — Other Ambulatory Visit (HOSPITAL_COMMUNITY): Payer: Self-pay | Admitting: *Deleted

## 2020-09-28 DIAGNOSIS — Z1212 Encounter for screening for malignant neoplasm of rectum: Secondary | ICD-10-CM | POA: Diagnosis not present

## 2020-09-29 ENCOUNTER — Other Ambulatory Visit: Payer: Self-pay

## 2020-09-29 ENCOUNTER — Other Ambulatory Visit: Payer: Medicare Other | Admitting: *Deleted

## 2020-09-29 ENCOUNTER — Other Ambulatory Visit (HOSPITAL_COMMUNITY)
Admission: RE | Admit: 2020-09-29 | Discharge: 2020-09-29 | Disposition: A | Discharge status: Home / Self Care | Payer: Medicare Other | Source: Ambulatory Visit | Attending: Internal Medicine | Admitting: Internal Medicine

## 2020-09-29 DIAGNOSIS — I255 Ischemic cardiomyopathy: Secondary | ICD-10-CM | POA: Diagnosis not present

## 2020-09-29 DIAGNOSIS — Z01812 Encounter for preprocedural laboratory examination: Secondary | ICD-10-CM | POA: Diagnosis present

## 2020-09-29 DIAGNOSIS — Z20822 Contact with and (suspected) exposure to covid-19: Secondary | ICD-10-CM | POA: Insufficient documentation

## 2020-09-29 DIAGNOSIS — N289 Disorder of kidney and ureter, unspecified: Secondary | ICD-10-CM

## 2020-09-29 DIAGNOSIS — I5022 Chronic systolic (congestive) heart failure: Secondary | ICD-10-CM | POA: Diagnosis not present

## 2020-09-29 LAB — CBC
Hematocrit: 41.2 % (ref 37.5–51.0)
Hemoglobin: 13.7 g/dL (ref 13.0–17.7)
MCH: 29.8 pg (ref 26.6–33.0)
MCHC: 33.3 g/dL (ref 31.5–35.7)
MCV: 90 fL (ref 79–97)
Platelets: 197 10*3/uL (ref 150–450)
RBC: 4.59 x10E6/uL (ref 4.14–5.80)
RDW: 13.1 % (ref 11.6–15.4)
WBC: 6.2 10*3/uL (ref 3.4–10.8)

## 2020-09-29 LAB — BASIC METABOLIC PANEL
BUN/Creatinine Ratio: 12 (ref 10–24)
BUN: 31 mg/dL — ABNORMAL HIGH (ref 8–27)
CO2: 24 mmol/L (ref 20–29)
Calcium: 9.1 mg/dL (ref 8.6–10.2)
Chloride: 102 mmol/L (ref 96–106)
Creatinine, Ser: 2.52 mg/dL — ABNORMAL HIGH (ref 0.76–1.27)
GFR calc Af Amer: 26 mL/min/{1.73_m2} — ABNORMAL LOW (ref >59)
GFR calc non Af Amer: 22 mL/min/{1.73_m2} — ABNORMAL LOW (ref >59)
Glucose: 114 mg/dL — ABNORMAL HIGH (ref 65–99)
Potassium: 4.1 mmol/L (ref 3.5–5.2)
Sodium: 140 mmol/L (ref 134–144)

## 2020-09-30 LAB — SARS CORONAVIRUS 2 (TAT 6-24 HRS): SARS Coronavirus 2: NEGATIVE

## 2020-10-02 ENCOUNTER — Ambulatory Visit (HOSPITAL_COMMUNITY)
Admission: RE | Admit: 2020-10-02 | Discharge: 2020-10-02 | Disposition: A | Discharge status: Home / Self Care | Payer: Medicare Other | Attending: Internal Medicine | Admitting: Internal Medicine

## 2020-10-02 ENCOUNTER — Other Ambulatory Visit: Payer: Self-pay

## 2020-10-02 ENCOUNTER — Encounter (HOSPITAL_COMMUNITY)
Admission: RE | Disposition: A | Discharge status: Home / Self Care | Payer: Self-pay | Source: Home / Self Care | Attending: Internal Medicine

## 2020-10-02 DIAGNOSIS — I130 Hypertensive heart and chronic kidney disease with heart failure and stage 1 through stage 4 chronic kidney disease, or unspecified chronic kidney disease: Secondary | ICD-10-CM | POA: Insufficient documentation

## 2020-10-02 DIAGNOSIS — Z79890 Hormone replacement therapy (postmenopausal): Secondary | ICD-10-CM | POA: Diagnosis not present

## 2020-10-02 DIAGNOSIS — N189 Chronic kidney disease, unspecified: Secondary | ICD-10-CM | POA: Insufficient documentation

## 2020-10-02 DIAGNOSIS — I4891 Unspecified atrial fibrillation: Secondary | ICD-10-CM | POA: Diagnosis not present

## 2020-10-02 DIAGNOSIS — Z888 Allergy status to other drugs, medicaments and biological substances status: Secondary | ICD-10-CM | POA: Diagnosis not present

## 2020-10-02 DIAGNOSIS — Z7984 Long term (current) use of oral hypoglycemic drugs: Secondary | ICD-10-CM | POA: Insufficient documentation

## 2020-10-02 DIAGNOSIS — Z79899 Other long term (current) drug therapy: Secondary | ICD-10-CM | POA: Diagnosis not present

## 2020-10-02 DIAGNOSIS — Z85828 Personal history of other malignant neoplasm of skin: Secondary | ICD-10-CM | POA: Insufficient documentation

## 2020-10-02 DIAGNOSIS — Z7901 Long term (current) use of anticoagulants: Secondary | ICD-10-CM | POA: Insufficient documentation

## 2020-10-02 DIAGNOSIS — Z8551 Personal history of malignant neoplasm of bladder: Secondary | ICD-10-CM | POA: Insufficient documentation

## 2020-10-02 DIAGNOSIS — Z885 Allergy status to narcotic agent status: Secondary | ICD-10-CM | POA: Diagnosis not present

## 2020-10-02 DIAGNOSIS — Z955 Presence of coronary angioplasty implant and graft: Secondary | ICD-10-CM | POA: Insufficient documentation

## 2020-10-02 DIAGNOSIS — I255 Ischemic cardiomyopathy: Secondary | ICD-10-CM | POA: Insufficient documentation

## 2020-10-02 DIAGNOSIS — Z4502 Encounter for adjustment and management of automatic implantable cardiac defibrillator: Secondary | ICD-10-CM | POA: Diagnosis not present

## 2020-10-02 DIAGNOSIS — I5022 Chronic systolic (congestive) heart failure: Secondary | ICD-10-CM | POA: Diagnosis not present

## 2020-10-02 DIAGNOSIS — Z880 Allergy status to penicillin: Secondary | ICD-10-CM | POA: Insufficient documentation

## 2020-10-02 HISTORY — PX: BIV ICD GENERATOR CHANGEOUT: EP1194

## 2020-10-02 SURGERY — BIV ICD GENERATOR CHANGEOUT

## 2020-10-02 MED ORDER — VANCOMYCIN HCL IN DEXTROSE 1-5 GM/200ML-% IV SOLN
1000.0000 mg | INTRAVENOUS | Status: AC
  Administered 2020-10-02: 1000 mg via INTRAVENOUS

## 2020-10-02 MED ORDER — LIDOCAINE HCL (PF) 1 % IJ SOLN
INTRAMUSCULAR | Status: DC | PRN
  Administered 2020-10-02: 45 mL

## 2020-10-02 MED ORDER — LIDOCAINE HCL (PF) 1 % IJ SOLN
INTRAMUSCULAR | Status: AC
  Filled 2020-10-02: qty 60

## 2020-10-02 MED ORDER — SODIUM CHLORIDE 0.9 % IV SOLN
INTRAVENOUS | Status: AC
  Filled 2020-10-02: qty 2

## 2020-10-02 MED ORDER — SODIUM CHLORIDE 0.9 % IV SOLN
80.0000 mg | INTRAVENOUS | Status: AC
  Administered 2020-10-02: 80 mg

## 2020-10-02 MED ORDER — CHLORHEXIDINE GLUCONATE 4 % EX LIQD: 4.0000 "application " | Freq: Once | CUTANEOUS | Status: DC

## 2020-10-02 MED ORDER — POVIDONE-IODINE 10 % EX SWAB: 2.0000 "application " | Freq: Once | CUTANEOUS | Status: DC

## 2020-10-02 MED ORDER — VANCOMYCIN HCL IN DEXTROSE 1-5 GM/200ML-% IV SOLN
INTRAVENOUS | Status: AC
  Filled 2020-10-02: qty 200

## 2020-10-02 MED ORDER — SODIUM CHLORIDE 0.9 % IV SOLN: INTRAVENOUS | Status: DC

## 2020-10-02 MED ORDER — ACETAMINOPHEN 325 MG PO TABS
325.0000 mg | ORAL_TABLET | ORAL | Status: DC | PRN
  Filled 2020-10-02: qty 2

## 2020-10-02 MED ORDER — ONDANSETRON HCL 4 MG/2ML IJ SOLN: 4.0000 mg | Freq: Four times a day (QID) | INTRAMUSCULAR | Status: DC | PRN

## 2020-10-02 SURGICAL SUPPLY — 4 items
HEMOSTAT SURGICEL 2X4 FIBR (HEMOSTASIS) ×3 IMPLANT
ICD UNIFY ASURA CRT CD3357-40C (ICD Generator) ×3 IMPLANT
PAD PRO RADIOLUCENT 2001M-C (PAD) ×3 IMPLANT
TRAY PACEMAKER INSERTION (PACKS) ×3 IMPLANT

## 2020-10-02 NOTE — Discharge Instructions (Signed)

## 2020-10-02 NOTE — Interval H&P Note (Signed)
History and Physical Interval Note:  10/02/2020 11:22 AM  [name]  has presented today for surgery, with the diagnosis of ERI.  The various methods of treatment have been discussed with the patient and family. After consideration of risks, benefits and other options for treatment, the patient has consented to  Procedure(s): BIV ICD [place] (N/A) as a surgical intervention.  The patient's history has been reviewed, patient examined, no change in status, stable for surgery.  I have reviewed the patient's chart and labs.  Questions were answered to the patient's satisfaction.     Virl Axe

## 2020-10-02 NOTE — Interval H&P Note (Signed)
ICD Criteria  Current LVEF:25%. Within 12 months prior to implant: No   Heart failure history: Yes, Class III  Cardiomyopathy history: Yes, Ischemic Cardiomyopathy - Prior MI.  Atrial Fibrillation/Atrial Flutter: Yes, Persistent (> 7 Days).  Ventricular tachycardia history: No.  Cardiac arrest history: No.  History of syndromes with risk of sudden death: No.  Previous ICD: Yes, Reason for ICD:  Primary prevention.  Current ICD indication: Primary  PPM indication: No.  Class I or II Bradycardia indication present: No  Beta Blocker therapy for 3 or more months: Yes, prescribed.   Ace Inhibitor/ARB therapy for 3 or more months: No, medical reason.   I have seen [name] is a 86 y.o. malepre-procedural seen for consideration of ICD reimplantation for primary prevention of sudden death.  The patient's chart has been reviewed and they meet criteria for ICD implant.  I have had a thorough discussion with the patient reviewing options.  The patient and their family (if available) have had opportunities to ask questions and have them answered. The patient and I have decided together through the [place] [employer] to reimplant ICD at this time.  Risks, benefits, alternatives to ICD implantation were discussed in detail with the patient today. The patient  understands that the risks include but are not limited to bleeding, infection, pneumothorax, perforation, tamponade, vascular damage, renal failure, MI, stroke, death, inappropriate shocks, and lead dislodgement and  wishes to proceed.  History and Physical Interval Note:  10/02/2020 11:47 AM  [name]  has presented today for surgery, with the diagnosis of ERI.  The various methods of treatment have been discussed with the patient and family. After consideration of risks, benefits and other options for treatment, the patient has consented to  Procedure(s): BIV ICD [place] (N/A) as a  surgical intervention.  The patient's history has been reviewed, patient examined, no change in status, stable for surgery.  I have reviewed the patient's chart and labs.  Questions were answered to the patient's satisfaction.     Virl Axe

## 2020-10-03 ENCOUNTER — Encounter (HOSPITAL_COMMUNITY): Payer: Self-pay | Admitting: Internal Medicine

## 2020-10-10 ENCOUNTER — Telehealth: Payer: Self-pay

## 2020-10-10 NOTE — Telephone Encounter (Signed)
Patient called in and states he has been exposed to covid during the [place] holiday. He has a in office appt coming up and he needs to reschedule until after he gets his results. Patient denies any symptoms and states his wound feels fine but he is unable to send a picture since he does not have a smart phone to do that. Patient will be rescheduled until after he gets results

## 2020-10-10 NOTE — Telephone Encounter (Signed)
Calling patient to reschedule device clinic apt. Apt made 10/19/20 @ 2:40 pm. Advised to call if positive for Covid. Patient denies any issues with site. Advised to call if swelling, drainage, fever or chills occur. Verbalized understanding.

## 2020-10-12 ENCOUNTER — Ambulatory Visit: Payer: Medicare Other

## 2020-10-15 DIAGNOSIS — Z20822 Contact with and (suspected) exposure to covid-19: Secondary | ICD-10-CM | POA: Diagnosis not present

## 2020-10-19 ENCOUNTER — Other Ambulatory Visit: Payer: Self-pay

## 2020-10-19 ENCOUNTER — Ambulatory Visit (INDEPENDENT_AMBULATORY_CARE_PROVIDER_SITE_OTHER): Payer: Medicare Other | Admitting: Emergency Medicine

## 2020-10-19 DIAGNOSIS — Z95810 Presence of automatic (implantable) cardiac defibrillator: Secondary | ICD-10-CM

## 2020-10-19 DIAGNOSIS — I255 Ischemic cardiomyopathy: Secondary | ICD-10-CM

## 2020-10-19 LAB — CUP PACEART INCLINIC DEVICE CHECK
Battery Remaining Longevity: 68 mo
Brady Statistic RA Percent Paced: 95 %
Brady Statistic RV Percent Paced: 98 %
Date Time Interrogation Session: 20220106150749
HighPow Impedance: 57.0353
Implantable Lead Implant Date: 20060509
Implantable Lead Implant Date: 20060509
Implantable Lead Implant Date: 20090417
Implantable Lead Location: 753858
Implantable Lead Location: 753859
Implantable Lead Location: 753860
Implantable Lead Model: 5076
Implantable Lead Model: 7001
Implantable Pulse Generator Implant Date: 20211220
Lead Channel Impedance Value: 412.5 Ohm
Lead Channel Impedance Value: 512.5 Ohm
Lead Channel Impedance Value: 600 Ohm
Lead Channel Pacing Threshold Amplitude: 0.875 V
Lead Channel Pacing Threshold Amplitude: 1 V
Lead Channel Pacing Threshold Amplitude: 1 V
Lead Channel Pacing Threshold Pulse Width: 0.5 ms
Lead Channel Pacing Threshold Pulse Width: 0.8 ms
Lead Channel Pacing Threshold Pulse Width: 0.8 ms
Lead Channel Sensing Intrinsic Amplitude: 11.7 mV
Lead Channel Sensing Intrinsic Amplitude: 4.4 mV
Lead Channel Setting Pacing Amplitude: 2 V
Lead Channel Setting Pacing Amplitude: 2 V
Lead Channel Setting Pacing Amplitude: 2 V
Lead Channel Setting Pacing Pulse Width: 0.8 ms
Lead Channel Setting Pacing Pulse Width: 0.8 ms
Lead Channel Setting Sensing Sensitivity: 0.5 mV
Pulse Gen Serial Number: 9905796

## 2020-10-19 NOTE — Progress Notes (Signed)
Wound check appointment for CRT-D. Derma- bond removed. Wound without redness or edema. Incision edges approximated, wound well healed. Normal device function. Thresholds, sensing, and impedances consistent with implant measurements. Device programmed at chronic settings due to mature leads. Histogram distribution appropriate for patient and level of activity. 3 AMS episodes that appear to be AF with controlled v-rates, longest 7 hours 31 minutes, + Eliquis. No ventricular arrhythmias noted. Patient educated about wound care, arm mobility, lifting restrictions, shock plan. ROV with Dr. Caryl Comes 01/08/21. Enrolled in remote follow-up and next remote 01/01/21.

## 2020-11-06 ENCOUNTER — Ambulatory Visit: Payer: Medicare Other | Admitting: Podiatry

## 2020-11-10 ENCOUNTER — Other Ambulatory Visit: Payer: Self-pay

## 2020-11-10 ENCOUNTER — Ambulatory Visit (INDEPENDENT_AMBULATORY_CARE_PROVIDER_SITE_OTHER): Payer: Medicare Other | Admitting: Podiatry

## 2020-11-10 ENCOUNTER — Encounter: Payer: Self-pay | Admitting: Podiatry

## 2020-11-10 DIAGNOSIS — M79675 Pain in left toe(s): Secondary | ICD-10-CM

## 2020-11-10 DIAGNOSIS — M79674 Pain in right toe(s): Secondary | ICD-10-CM | POA: Diagnosis not present

## 2020-11-10 DIAGNOSIS — B351 Tinea unguium: Secondary | ICD-10-CM | POA: Diagnosis not present

## 2020-11-10 DIAGNOSIS — Z9229 Personal history of other drug therapy: Secondary | ICD-10-CM

## 2020-11-12 NOTE — Progress Notes (Signed)
Subjective: [name] is a 87 y.o. male patient seen today for painful mycotic nails b/l that are difficult to trim. Pain interferes with ambulation. Aggravating factors include wearing enclosed shoe gear. Pain is relieved with periodic professional debridement.   His wife is present during today's visit. They voice no new pedal concerns on today's visit.  Patient Active Problem List   Diagnosis Date Noted  . Enterococcal bacteremia 12/08/2017  . Nausea & vomiting 12/06/2017  . Renal insufficiency 12/06/2017  . Acute respiratory failure with hypoxia ([place]) 09/10/2016  . Acute bronchitis with COPD ([place]) 09/10/2016  . CKD (chronic kidney disease) stage 4, GFR 15-29 ml/min (HCC) 04/04/2015  . 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 Urosepsis 08/16/2013  . Bruising 08/12/2012  . Bladder cancer ([place]) 07/08/2012  . UTI (urinary tract infection) 05/27/2012  . Bladder tumor 05/18/2012  . Knee pain, acute, right 02/11/2012  . CAROTID ARTERY DISEASE 12/06/2010  . Essential hypertension, benign 09/22/2009  . Chronic systolic CHF (congestive heart failure) (HCC)-EF 25% 09/14/2009  . CHEST PAIN 08/08/2009  . Mixed hyperlipidemia 05/25/2009  . CAD, NATIVE VESSEL 02/01/2009  . Ischemic cardiomyopathy 01/24/2009  . Atrial fibrillation ([place]) 01/21/2009  . 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 acetaminophen (TYLENOL) 500 MG tablet Take 500-1,000 mg by mouth every 6 (six) hours as needed (pain.).    Marland Kitchen amiodarone (PACERONE) 200 MG tablet TAKE 1 TABLET BY MOUTH EVERY DAY 90 tablet 3  . carboxymethylcellulose (REFRESH PLUS) 0.5 % SOLN Place 1 drop into both eyes 3 (three) times daily as needed (dry/irritated eyes.).    Marland Kitchen carvedilol (COREG) 6.25 MG tablet TAKE 1 TABLET (6.25 MG TOTAL) BY MOUTH 2 (TWO) TIMES DAILY WITH A  MEAL. 180 tablet 1  . dapagliflozin propanediol (FARXIGA) 10 MG TABS tablet Take 1 tablet (10 mg total) by mouth daily before breakfast. 90 tablet 3  . ELIQUIS 2.5 MG TABS tablet TAKE 1 TABLET BY MOUTH TWICE A DAY (Patient taking differently: Take 2.5 mg by mouth 2 (two) times daily.) 180 tablet 3  . ezetimibe (ZETIA) 10 MG tablet TAKE ONE TABLET BY MOUTH ONCE DAILY WITH SUPPER (Patient taking differently: Take 10 mg by mouth every evening.) 90 tablet 3  . ferrous sulfate 325 (65 FE) MG tablet Take 325 mg by mouth daily with supper.    . furosemide (LASIX) 20 MG tablet Take 2 tablets (40 mg total) by mouth daily. 180 tablet 2  . levothyroxine (SYNTHROID) 75 MCG tablet Take 1 tablet (75 mcg total) by mouth daily before breakfast. 30 tablet 11  . Multiple Vitamin (MULTIVITAMIN WITH MINERALS) TABS tablet Take 1 tablet by mouth every evening. Centrum Silver    . nitroGLYCERIN (NITROSTAT) 0.4 MG SL tablet DISSOLVE ONE TABLET UNDER THE TONGUE EVERY 5 MINUTES AS NEEDED FOR CHEST PAIN.  DO NOT EXCEED A TOTAL OF 3 DOSES IN 15 MINUTES (Patient taking differently: Place 0.4 mg under the tongue every 5 (five) minutes x 3 doses as needed for chest pain.) 25 tablet 0  . pantoprazole (PROTONIX) 40 MG tablet Take 40 mg by mouth daily before breakfast.    . potassium chloride (K-DUR,KLOR-CON) 10 MEQ tablet Take 1 tablet (10 mEq total) by mouth daily. 30 tablet 0  . rosuvastatin (CRESTOR) 20 MG tablet Take 1 tablet (20 mg total) by mouth daily. (Patient taking differently:  Take 20 mg by mouth every evening.) 90 tablet 3  . sodium chloride (OCEAN) 0.65 % SOLN nasal spray Place 1 spray into both nostrils as needed for congestion.    . tamsulosin (FLOMAX) 0.4 MG CAPS capsule Take 0.4 mg by mouth at bedtime.      No current facility-administered medications on file prior to visit.    Allergies  Allergen Reactions  . Ramipril Cough  . Penicillins Itching and Rash    Has patient had a PCN reaction causing immediate  rash, facial/tongue/throat swelling, SOB or lightheadedness with hypotension: No Has patient had a PCN reaction causing severe rash involving mucus membranes or skin necrosis: Yes-back and hands. Has patient had a PCN reaction that required hospitalization: No Has patient had a PCN reaction occurring within the last 10 years: Yes If all of the above answers are "NO", then may proceed with Cephalosporin use.   Objective: Physical Exam  General: Patient is a pleasant 87 y.o. Caucasian male in no acute distress, Awake, alert and oriented x 3  Neurovascular status unchanged b/l.  Neurovascular status unchanged b/l lower extremities. Capillary fill time to digits <3 seconds b/l lower extremities. Palpable DP pulses b/l. Palpable PT pulses b/l. Pedal hair absent b/l. Skin temperature gradient within normal limits b/l. No pain with calf compression b/l. No edema noted b/l.   Protective sensation intact 5/5 intact bilaterally with 10g monofilament b/l. Vibratory sensation intact b/l. Proprioception intact bilaterally.  Dermatological:  Pedal skin with normal turgor, texture and tone bilaterally. No open wounds bilaterally. No interdigital macerations bilaterally. Toenails 1-5 b/l elongated, discolored, dystrophic, thickened, crumbly with subungual debris and tenderness to dorsal palpation.  Musculoskeletal:  Normal muscle strength 5/5 to all lower extremity muscle groups bilaterally. No pain crepitus or joint limitation noted with ROM b/l. Hallux valgus with bunion deformity noted b/l lower extremities.  Assessment and Plan:  1. Pain due to onychomycosis of toenails of both feet   2. Hx of long term use of blood thinners    -Examined patient. -No new findings. No new orders. -Toenails 1-5 b/l were debrided in length and girth with sterile nail nippers and dremel without iatrogenic bleeding.  -Patient to report any pedal injuries to medical professional immediately. -Patient/POA to call should  there be question/concern in the interim.  Return in about 3 months (around 02/08/2021).  Marzetta Board, DPM

## 2020-12-12 DIAGNOSIS — L570 Actinic keratosis: Secondary | ICD-10-CM | POA: Diagnosis not present

## 2020-12-12 DIAGNOSIS — Z85828 Personal history of other malignant neoplasm of skin: Secondary | ICD-10-CM | POA: Diagnosis not present

## 2020-12-13 ENCOUNTER — Telehealth: Payer: Self-pay | Admitting: Internal Medicine

## 2020-12-13 NOTE — Telephone Encounter (Signed)
Pt c/o medication issue:  1. Name of Medication:  levothyroxine (SYNTHROID) 75 MCG tablet [[id number]]  pantoprazole (PROTONIX) 40 MG tablet [[id number]]   2. How are you currently taking this medication (dosage and times per day)?  3. Are you having a reaction (difficulty breathing--STAT)? No   4. What is your medication issue? Pt went to pick up meds and they pharmaciest  put a note on script that there maybe a medication interaction with these to meds .  You May need to contact your provider  Best number - [phone]

## 2020-12-14 NOTE — Telephone Encounter (Signed)
Spoke with pt's wife Romie Minus, [place] and advised per PharmD team it is ok for pt to take both meds but to take them about 2 hours apart.  Pt's wife states pt fell in the yard about 3 hours ago d/t a miss step.  Denies pt hitting his head or further injury.  Pt is now sleeping after taking 2 tylenol.  Advised pt's wife to have pt follow up with his PCP d/t fall if needed.  Pt's wife also asking if it is ok for pt to take the Protonix at night.  Pt's wife instructed to contact prescribing provider of Protonix for further advisement as Levothyroxine should be taken in the mornings prior to breakfast. Reminded of pt's appointment scheduled for 01/08/2021 with Dr Caryl Comes. Pt's wife verbalizes understanding and thanked Therapist, sports for the call.

## 2020-12-14 NOTE — Telephone Encounter (Signed)
87 yo take both medication. Please take at least 2 hours apart.

## 2020-12-25 DIAGNOSIS — N184 Chronic kidney disease, stage 4 (severe): Secondary | ICD-10-CM | POA: Diagnosis not present

## 2021-01-01 ENCOUNTER — Ambulatory Visit (INDEPENDENT_AMBULATORY_CARE_PROVIDER_SITE_OTHER): Payer: Medicare Other

## 2021-01-01 DIAGNOSIS — I255 Ischemic cardiomyopathy: Secondary | ICD-10-CM

## 2021-01-02 DIAGNOSIS — I509 Heart failure, unspecified: Secondary | ICD-10-CM | POA: Diagnosis not present

## 2021-01-02 DIAGNOSIS — N184 Chronic kidney disease, stage 4 (severe): Secondary | ICD-10-CM | POA: Diagnosis not present

## 2021-01-02 DIAGNOSIS — D472 Monoclonal gammopathy: Secondary | ICD-10-CM | POA: Diagnosis not present

## 2021-01-02 DIAGNOSIS — I129 Hypertensive chronic kidney disease with stage 1 through stage 4 chronic kidney disease, or unspecified chronic kidney disease: Secondary | ICD-10-CM | POA: Diagnosis not present

## 2021-01-02 LAB — CUP PACEART REMOTE DEVICE CHECK
Battery Remaining Longevity: 67 mo
Battery Remaining Percentage: 90 %
Battery Voltage: 3.07 V
Brady Statistic AP VP Percent: 97 %
Brady Statistic AP VS Percent: 1 %
Brady Statistic AS VP Percent: 2.4 %
Brady Statistic AS VS Percent: 1 %
Brady Statistic RA Percent Paced: 97 %
Date Time Interrogation Session: 20220321020018
HighPow Impedance: 49 Ohm
HighPow Impedance: 49 Ohm
Implantable Lead Implant Date: 20060509
Implantable Lead Implant Date: 20060509
Implantable Lead Implant Date: 20090417
Implantable Lead Location: 753858
Implantable Lead Location: 753859
Implantable Lead Location: 753860
Implantable Lead Model: 5076
Implantable Lead Model: 7001
Implantable Pulse Generator Implant Date: 20211220
Lead Channel Impedance Value: 390 Ohm
Lead Channel Impedance Value: 510 Ohm
Lead Channel Impedance Value: 510 Ohm
Lead Channel Pacing Threshold Amplitude: 1 V
Lead Channel Pacing Threshold Amplitude: 1.125 V
Lead Channel Pacing Threshold Amplitude: 1.125 V
Lead Channel Pacing Threshold Pulse Width: 0.5 ms
Lead Channel Pacing Threshold Pulse Width: 0.8 ms
Lead Channel Pacing Threshold Pulse Width: 0.8 ms
Lead Channel Sensing Intrinsic Amplitude: 12 mV
Lead Channel Sensing Intrinsic Amplitude: 3.1 mV
Lead Channel Setting Pacing Amplitude: 2 V
Lead Channel Setting Pacing Amplitude: 2.125
Lead Channel Setting Pacing Amplitude: 2.125
Lead Channel Setting Pacing Pulse Width: 0.8 ms
Lead Channel Setting Pacing Pulse Width: 0.8 ms
Lead Channel Setting Sensing Sensitivity: 0.5 mV
Pulse Gen Serial Number: 9905796

## 2021-01-04 DIAGNOSIS — M25511 Pain in right shoulder: Secondary | ICD-10-CM | POA: Diagnosis not present

## 2021-01-04 DIAGNOSIS — M25522 Pain in left elbow: Secondary | ICD-10-CM | POA: Diagnosis not present

## 2021-01-04 DIAGNOSIS — M25512 Pain in left shoulder: Secondary | ICD-10-CM | POA: Diagnosis not present

## 2021-01-08 ENCOUNTER — Ambulatory Visit (INDEPENDENT_AMBULATORY_CARE_PROVIDER_SITE_OTHER): Payer: Medicare Other | Admitting: Internal Medicine

## 2021-01-08 ENCOUNTER — Encounter: Payer: Self-pay | Admitting: Internal Medicine

## 2021-01-08 ENCOUNTER — Other Ambulatory Visit: Payer: Self-pay

## 2021-01-08 VITALS — BP 110/72 | HR 63 | Ht 70.0 in | Wt 188.0 lb

## 2021-01-08 DIAGNOSIS — E032 Hypothyroidism due to medicaments and other exogenous substances: Secondary | ICD-10-CM

## 2021-01-08 DIAGNOSIS — I480 Paroxysmal atrial fibrillation: Secondary | ICD-10-CM

## 2021-01-08 DIAGNOSIS — Z95810 Presence of automatic (implantable) cardiac defibrillator: Secondary | ICD-10-CM | POA: Diagnosis not present

## 2021-01-08 DIAGNOSIS — I255 Ischemic cardiomyopathy: Secondary | ICD-10-CM

## 2021-01-08 DIAGNOSIS — I4891 Unspecified atrial fibrillation: Secondary | ICD-10-CM

## 2021-01-08 DIAGNOSIS — Z79899 Other long term (current) drug therapy: Secondary | ICD-10-CM

## 2021-01-08 NOTE — Progress Notes (Signed)
Remote ICD transmission.   

## 2021-01-08 NOTE — Progress Notes (Signed)
Patient Care Team: [name], MD as PCP - General (Internal Medicine) Haroldine Laws, Shaune Pascal, MD as Consulting Physician (Cardiology)   HPI  [name] is a 87 y.o. male seen in followup for ischemic heart myopathy congestive heart failure and prior CRT-D with generator replaced 12/14; 12/21.     Recurrent  atrial fibrillation. Managed with amiodarone -- had problems with neurotoxicity with gait instability and his amiodarone  decreased>>and then stopped 2/2 tremor  Resumed 2020 for recurrent afib and has tolerated   Patient denies symptoms of GI intolerance, sun sensitivity, continues with tremor, ameliorated by downtitration\  Recent fall--feet got caught in a narrow space he has had some wobbliness upon standing, denies associated lightheadedness.  The wobbliness improves over minutes.  The patient denies chest pain, shortness of breath, nocturnal dyspnea, orthopnea or peripheral edema.  There have been no palpitations, lightheadedness or syncope.    Date Cr K Hgb TSH LFT  4/17 2.5        6/19    1.22 103  2/20 2.49 3.6 13.1    11/20 2.2  12.6 1.39 10   6/21 2.49 4.5 12.8 0.757 (8/21)   12/21 2.52 4.1 13.7     Echo 12/14 LVEF 20-25% Carotid u/s 3/14: 40-59% R 0-39%%L Echo 11/2017: EF 61%  Thromboembolic risk factors ( age  -2, Vasc disease -1, CHF-1) for a CHADSVASc Score of >=4        Past Medical History:  Diagnosis Date  . Acute bronchitis with COPD ([place]) 09/10/2016  . Anemia   . Atrial fibrillation or flutter    maintaining sinus on amiodarone    . Bladder cancer ([place]) dx'd 05/2012  . BPH (benign prostatic hyperplasia)   . CAD (coronary artery disease)     a. s/p anterior MI 12/05 c/b shock -> stent LAD   b. s/p stenting OM-1, 2/06  . CHF (congestive heart failure) (HCC)    due to ischemic CM  a. EF 20-30%. (Nov 2008)   b. s/p St. Jude BiV-ICD    c. CPX 07/2008  pvo2 16.3 (63% predicted) slope 34 RER 1.08 O2 pulse 93%  . Cough    occasional  /productive, no fever/ not new  . CRI (chronic renal insufficiency)    (baseline 2.0-2.2)/ recent hospitalization 8/13  . GERD (gastroesophageal reflux disease)    hx Barretts esophagitis  . History of blood transfusion    following coumadin  usage  . HTN (hypertension)    EKG 05/14/12,Chest x ray 8/13, last ICD interrogation 8/13 EPIC  . Hyperlipidemia   . Hypothyroidism   . 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 Skin cancer    basal cells   facial x 4, 1 right forearm  . Sleep apnea    STOP BANG SCORE 4    Past Surgical History:  Procedure Laterality Date  . [place]   lower back  . BIV ICD GENERATOR CHANGEOUT N/A 10/02/2020   Procedure: BIV ICD GENERATOR CHANGEOUT;  Surgeon: Deboraha Sprang, MD;  Location: [facility];  Service: Cardiovascular;  Laterality: N/A;  . CARDIAC DEFIBRILLATOR PLACEMENT     ICD St Jude; gen change 09-20-13  . CERVICAL LAMINECTOMY  1985  . COLONOSCOPY    . CORONARY ANGIOPLASTY  2005,2006  . CYSTOSCOPY W/ RETROGRADES  05/25/2012  Procedure: CYSTOSCOPY WITH RETROGRADE PYELOGRAM;  Surgeon: Molli Hazard, MD;  Location: [facility];  Service: Urology;  Laterality: Bilateral;  . CYSTOSCOPY W/ URETERAL STENT PLACEMENT  07/08/2012   Procedure: CYSTOSCOPY WITH STENT REPLACEMENT;  Surgeon: Molli Hazard, MD;  Location: [facility];  Service: Urology;  Laterality: Left;  . CYSTOSCOPY W/ URETERAL STENT REMOVAL  07/08/2012   Procedure: CYSTOSCOPY WITH STENT REMOVAL;  Surgeon: Molli Hazard, MD;  Location: [facility];  Service: Urology;  Laterality: Bilateral;  . CYSTOSCOPY/RETROGRADE/URETEROSCOPY  07/08/2012   Procedure: CYSTOSCOPY/RETROGRADE/URETEROSCOPY;  Surgeon: Molli Hazard, MD;  Location: [facility];  Service: Urology;  Laterality: Left;  . ESOPHAGOGASTRODUODENOSCOPY    . HERNIA REPAIR  1989   bilateral   . IMPLANTABLE CARDIOVERTER DEFIBRILLATOR (ICD) GENERATOR CHANGE N/A 09/20/2013   Procedure: ICD GENERATOR CHANGE;  Surgeon: Deboraha Sprang, MD;  Location: [facility] C[facility];  Service: Cardiovascular;  Laterality: N/A;  . TEE WITHOUT CARDIOVERSION N/A 12/10/2017   Procedure: TRANSESOPHAGEAL ECHOCARDIOGRAM (TEE);  Surgeon: Jerline Pain, MD;  Location[facility] ENDOS[facility];  Service: Cardiovascular;  Laterality: N/A;  . TRANSURETHRAL RESECTION OF BLADDER TUMOR  05/25/2012   cold cup biopsy prostate  . TRANSURETHRAL RESECTION OF BLADDER TUMOR  07/08/2012   Procedure: TRANSURETHRAL RESECTION OF BLADDER TUMOR (TURBT);  Surgeon: Molli Hazard, MD;  Location: [facility];  Service: Urology;  Laterality: N/A;  CYSTO, TURBT W/ GYRUS, BILATERAL STENT REMOVAL, LEFT URETEROSCOPY WITH BIOPSY, POSSIBLE LEFT STENT PLACEMENT     Current Outpatient Medications  Medication Sig Dispense Refill  . acetaminophen (TYLENOL) 500 MG tablet Take 500-1,000 mg by mouth every 6 (six) hours as needed (pain.).    Marland Kitchen amiodarone (PACERONE) 200 MG tablet TAKE 1 TABLET BY MOUTH EVERY DAY 90 tablet 3  . carboxymethylcellulose (REFRESH PLUS) 0.5 % SOLN Place 1 drop into both eyes 3 (three) times daily as needed (dry/irritated eyes.).    Marland Kitchen carvedilol (COREG) 6.25 MG tablet TAKE 1 TABLET (6.25 MG TOTAL) BY MOUTH 2 (TWO) TIMES DAILY WITH A MEAL. 180 tablet 1  . dapagliflozin propanediol (FARXIGA) 10 MG TABS tablet Take 1 tablet (10 mg total) by mouth daily before breakfast. 90 tablet 3  . ELIQUIS 2.5 MG TABS tablet TAKE 1 TABLET BY MOUTH TWICE A DAY (Patient taking differently: Take 2.5 mg by mouth 2 (two) times daily.) 180 tablet 3  . ezetimibe (ZETIA) 10 MG tablet TAKE ONE TABLET BY MOUTH ONCE DAILY WITH SUPPER (Patient taking differently: Take 10 mg by mouth every evening.) 90 tablet 3  . ferrous sulfate 325 (65 FE) MG tablet Take 325 mg by mouth daily with supper.    . furosemide (LASIX) 20 MG tablet Take 2 tablets (40 mg total) by mouth  daily. 180 tablet 2  . levothyroxine (SYNTHROID) 75 MCG tablet Take 1 tablet (75 mcg total) by mouth daily before breakfast. 30 tablet 11  . Multiple Vitamin (MULTIVITAMIN WITH MINERALS) TABS tablet Take 1 tablet by mouth every evening. Centrum Silver    . nitroGLYCERIN (NITROSTAT) 0.4 MG SL tablet DISSOLVE ONE TABLET UNDER THE TONGUE EVERY 5 MINUTES AS NEEDED FOR CHEST PAIN.  DO NOT EXCEED A TOTAL OF 3 DOSES IN 15 MINUTES (Patient taking differently: Place 0.4 mg under the tongue every 5 (five) minutes x 3 doses as needed for chest pain.) 25 tablet 0  . pantoprazole (PROTONIX) 40 MG tablet Take 40 mg by mouth daily before breakfast.    . potassium chloride (K-DUR,KLOR-CON) 10 MEQ tablet Take 1 tablet (10  mEq total) by mouth daily. 30 tablet 0  . rosuvastatin (CRESTOR) 20 MG tablet Take 1 tablet (20 mg total) by mouth daily. (Patient taking differently: Take 20 mg by mouth every evening.) 90 tablet 3  . sodium chloride (OCEAN) 0.65 % SOLN nasal spray Place 1 spray into both nostrils as needed for congestion.    . tamsulosin (FLOMAX) 0.4 MG CAPS capsule Take 0.4 mg by mouth at bedtime.      No current facility-administered medications for this visit.    Allergies  Allergen Reactions  . Ramipril Cough  . Penicillins Itching and Rash    Has patient had a PCN reaction causing immediate rash, facial/tongue/throat swelling, SOB or lightheadedness with hypotension: No Has patient had a PCN reaction causing severe rash involving mucus membranes or skin necrosis: Yes-back and hands. Has patient had a PCN reaction that required hospitalization: No Has patient had a PCN reaction occurring within the last 10 years: Yes If all of the above answers are "NO", then may proceed with Cephalosporin use.    Review of Systems negative except from HPI and PMH  Physical Exam BP 110/72   Pulse 63   Ht 5\' 10"  (1.778 m)   Wt 188 lb (85.3 kg)   SpO2 98%   BMI 26.98 kg/m  Well developed and nourished in no  acute distress HENT normal Neck supple with JVP-  flat   Clear Regular rate and rhythm, no murmurs or gallops Abd-soft with active BS No Clubbing cyanosis edema Skin-warm and dry A & Oriented  Grossly normal sensory and motor function  ECG AV pacing upright QRS lead V1 negative QRS lead I    Assessment and  Plan  Ischemic cardiomyopathy  CRT-d  St Jude status post generator replacement 12/21  Atrial fibrillation   High Risk Medication Surveillance-amiodarone  Heart failure-chronic systolic  Renal insufficiency grade 3-4    Euvolemic continue current meds  No intercurrent atrial fibrillation or flutter  On Anticoagulation;  No bleeding issues   Continues with tremors on low-dose amiodarone.  But he would like to keep it as it is kept his A. fib a day  Fall sounds like it was his feet getting tangled up; the residual wobbliness concerning for orthostatic hypotension; however, measurements today did not confirm

## 2021-01-08 NOTE — Patient Instructions (Signed)
Medication Instructions:  Your physician recommends that you continue on your current medications as directed. Please refer to the Current Medication list given to you today.  *If you need a refill on your cardiac medications before your next appointment, please call your pharmacy*   Lab Work: Hepatic panel and TSH - Pt will have labs drawn at PCP office at June appointment. If you have labs (blood work) drawn today and your tests are completely normal, you will receive your results only by: Marland Kitchen MyChart Message (if you have MyChart) OR . A paper copy in the mail If you have any lab test that is abnormal or we need to change your treatment, we will call you to review the results.   Testing/Procedures: None ordered.    Follow-Up: At [facility], you and your health needs are our priority.  As part of our continuing mission to provide you with exceptional heart care, we have created designated Provider Care Teams.  These Care Teams include your primary Cardiologist (physician) and Advanced Practice Providers (APPs -  Physician Assistants and Nurse Practitioners) who all work together to provide you with the care you need, when you need it.  We recommend signing up for the patient portal called "MyChart".  Sign up information is provided on this After Visit Summary.  MyChart is used to connect with patients for Virtual Visits (Telemedicine).  Patients are able to view lab/test results, encounter notes, upcoming appointments, etc.  Non-urgent messages can be sent to your provider as well.   To learn more about what you can do with MyChart, go to [email or web address].    Your next appointment:   6 month(s)  The format for your next appointment:   In Person  Provider:   You will see one of the following Advanced Practice Providers on your designated Care Team:    Chanetta Marshall, NP  Tommye Standard, PA-C  Legrand Como "[facility]" [place], [place]

## 2021-01-12 ENCOUNTER — Encounter: Payer: Self-pay | Admitting: Podiatry

## 2021-01-12 ENCOUNTER — Other Ambulatory Visit (HOSPITAL_COMMUNITY): Payer: Self-pay | Admitting: Internal Medicine

## 2021-01-12 ENCOUNTER — Ambulatory Visit (INDEPENDENT_AMBULATORY_CARE_PROVIDER_SITE_OTHER): Payer: Medicare Other | Admitting: Podiatry

## 2021-01-12 ENCOUNTER — Other Ambulatory Visit: Payer: Self-pay

## 2021-01-12 DIAGNOSIS — M79674 Pain in right toe(s): Secondary | ICD-10-CM | POA: Diagnosis not present

## 2021-01-12 DIAGNOSIS — M79675 Pain in left toe(s): Secondary | ICD-10-CM

## 2021-01-12 DIAGNOSIS — B351 Tinea unguium: Secondary | ICD-10-CM

## 2021-01-17 ENCOUNTER — Encounter: Payer: Self-pay | Admitting: Internal Medicine

## 2021-01-17 ENCOUNTER — Telehealth: Payer: Self-pay

## 2021-01-17 ENCOUNTER — Other Ambulatory Visit: Payer: Self-pay | Admitting: Orthopedic Surgery

## 2021-01-17 DIAGNOSIS — M75102 Unspecified rotator cuff tear or rupture of left shoulder, not specified as traumatic: Secondary | ICD-10-CM

## 2021-01-17 NOTE — Telephone Encounter (Signed)
error 

## 2021-01-17 NOTE — Telephone Encounter (Signed)
Patient wife called in wanting to know if patient is able to get a CT scan with his device

## 2021-01-17 NOTE — Telephone Encounter (Signed)
Returned patients phone call. Advised that there are no known issues with having a CT scan and his device. Thanked me for call.

## 2021-01-18 NOTE — Progress Notes (Signed)
Subjective: [name] is a 87 y.o. male patient seen today for painful mycotic nails b/l that are difficult to trim. Pain interferes with ambulation. Aggravating factors include wearing enclosed shoe gear. Pain is relieved with periodic professional debridement.   His wife is present during today's visit. They voice no new pedal concerns on today's visit.  Allergies  Allergen Reactions  . Ramipril Cough  . Penicillins Itching and Rash    Has patient had a PCN reaction causing immediate rash, facial/tongue/throat swelling, SOB or lightheadedness with hypotension: No Has patient had a PCN reaction causing severe rash involving mucus membranes or skin necrosis: Yes-back and hands. Has patient had a PCN reaction that required hospitalization: No Has patient had a PCN reaction occurring within the last 10 years: Yes If all of the above answers are "NO", then may proceed with Cephalosporin use.   Objective: Physical Exam  General: Patient is a pleasant 87 y.o. Caucasian male in no acute distress, Awake, alert and oriented x 3  Neurovascular status unchanged b/l.  Neurovascular status unchanged b/l lower extremities. Capillary fill time to digits <3 seconds b/l lower extremities. Palpable DP pulses b/l. Palpable PT pulses b/l. Pedal hair absent b/l. Skin temperature gradient within normal limits b/l. No pain with calf compression b/l. No edema noted b/l.   Protective sensation intact 5/5 intact bilaterally with 10g monofilament b/l. Vibratory sensation intact b/l. Proprioception intact bilaterally.  Dermatological:  Pedal skin with normal turgor, texture and tone bilaterally. No open wounds bilaterally. No interdigital macerations bilaterally. Toenails 1-5 b/l elongated, discolored, dystrophic, thickened, crumbly with subungual debris and tenderness to dorsal palpation.  Musculoskeletal:  Normal muscle strength 5/5 to all lower extremity muscle groups bilaterally. No pain crepitus or  joint limitation noted with ROM b/l. Hallux valgus with bunion deformity noted b/l lower extremities.  Assessment and Plan:  1. Pain due to onychomycosis of toenails of both feet    -Examined patient. -Toenails 1-5 b/l were debrided in length and girth with sterile nail nippers and dremel without iatrogenic bleeding.  -Iatrogenic laceration sustained during R hallux.  Treated with Lumicain Hemostatic Solution and alcohol. Patient instructed to apply Neosporin to R hallux once daily for 7 days. -Patient to report any pedal injuries to medical professional immediately. -Patient/POA to call should there be question/concern in the interim.  Return in about 3 months (around 04/13/2021).  Marzetta Board, DPM

## 2021-01-29 ENCOUNTER — Other Ambulatory Visit: Payer: Medicare Other

## 2021-02-01 ENCOUNTER — Other Ambulatory Visit: Payer: Medicare Other

## 2021-02-15 ENCOUNTER — Other Ambulatory Visit: Payer: Self-pay

## 2021-02-15 ENCOUNTER — Ambulatory Visit (HOSPITAL_COMMUNITY)
Admission: RE | Admit: 2021-02-15 | Discharge: 2021-02-15 | Disposition: A | Discharge status: Home / Self Care | Payer: Medicare Other | Source: Ambulatory Visit | Attending: Adult Health | Admitting: Adult Health

## 2021-02-15 ENCOUNTER — Encounter (HOSPITAL_COMMUNITY): Payer: Self-pay

## 2021-02-15 VITALS — BP 122/60 | HR 73 | Wt 184.8 lb

## 2021-02-15 DIAGNOSIS — I130 Hypertensive heart and chronic kidney disease with heart failure and stage 1 through stage 4 chronic kidney disease, or unspecified chronic kidney disease: Secondary | ICD-10-CM | POA: Diagnosis not present

## 2021-02-15 DIAGNOSIS — I2510 Atherosclerotic heart disease of native coronary artery without angina pectoris: Secondary | ICD-10-CM | POA: Insufficient documentation

## 2021-02-15 DIAGNOSIS — G250 Essential tremor: Secondary | ICD-10-CM | POA: Insufficient documentation

## 2021-02-15 DIAGNOSIS — Z7901 Long term (current) use of anticoagulants: Secondary | ICD-10-CM | POA: Insufficient documentation

## 2021-02-15 DIAGNOSIS — Z955 Presence of coronary angioplasty implant and graft: Secondary | ICD-10-CM | POA: Insufficient documentation

## 2021-02-15 DIAGNOSIS — E785 Hyperlipidemia, unspecified: Secondary | ICD-10-CM | POA: Diagnosis not present

## 2021-02-15 DIAGNOSIS — Z79899 Other long term (current) drug therapy: Secondary | ICD-10-CM | POA: Diagnosis not present

## 2021-02-15 DIAGNOSIS — Z95810 Presence of automatic (implantable) cardiac defibrillator: Secondary | ICD-10-CM | POA: Insufficient documentation

## 2021-02-15 DIAGNOSIS — I480 Paroxysmal atrial fibrillation: Secondary | ICD-10-CM | POA: Diagnosis not present

## 2021-02-15 DIAGNOSIS — I252 Old myocardial infarction: Secondary | ICD-10-CM | POA: Diagnosis not present

## 2021-02-15 DIAGNOSIS — N184 Chronic kidney disease, stage 4 (severe): Secondary | ICD-10-CM | POA: Insufficient documentation

## 2021-02-15 DIAGNOSIS — Z8551 Personal history of malignant neoplasm of bladder: Secondary | ICD-10-CM | POA: Diagnosis not present

## 2021-02-15 DIAGNOSIS — I5022 Chronic systolic (congestive) heart failure: Secondary | ICD-10-CM | POA: Diagnosis not present

## 2021-02-15 NOTE — Progress Notes (Signed)
Patient ID: [name], male   DOB: Jun 18, 1932, 87 y.o.   MRN: [id number]    Advanced Heart Failure Clinic Note   PCP: Dr. Virgina [name] Neprology: Dr [name].  HF MD: Dr [name]  HPI: [name] is a 87 y.o. male with a history of coronary artery disease, status post previous large anterior wall myocardial infarction s/p multivessel stenting, systolic CHF with EF 70% s/p St. Jude BiV ICD.  Remainder of his medical history is notable for atrial fibrillation,maintaining sinus rhythm on amiodarone.  He is not on Coumadin secondary to GI bleed, chronic renal insufficiency with baseline creatinine about 2.2-2.5, hypertension, hyperlipidemia, and a systemic tremor.Bladder cancer 05/2012. Completed BCG treatment.   2015 found that his left ventricular lead has not been capturing suggestive of either fracture of the proximal electrode or an issue at the header. They reprogrammed the device to use to the RV coil which he tolerated well and had a good threshold.   Admitted 12/30 - 10/15/16 with A/C CHF in setting of URI. Thought to have mild CHF, so given IV diuresis in house with rapid improvement.   Admitted 2/19 with enterococcus bacteremia thought to be UTI. Treated with daptomycin. TEE EF 20-25% no vegetation mod MR   Had mechanical fall 12/2020 . Xray of LUE stable.   Today he returns for HF follow up with his wife. Overall feeling fine. Complaining of tremor. Does admit to being tired and wanting to do more. Denies SOB/PND/Orthopnea. No chest tightness.  Appetite ok. No fever or chills. Weight at home has been stable.  Taking all medications.   Carotid u/s 3/14: 40-59% R 0-39%  Echo 11/2017: EF 25%   Review of systems complete and found to be negative unless listed in HPI.    Past Medical History:  Diagnosis Date  . Acute bronchitis with COPD ([place]) 09/10/2016  . Anemia   . Atrial fibrillation or flutter    maintaining sinus on amiodarone    . Bladder cancer ([place]) dx'd 05/2012  .  BPH (benign prostatic hyperplasia)   . CAD (coronary artery disease)     a. s/p anterior MI 12/05 c/b shock -> stent LAD   b. s/p stenting OM-1, 2/06  . CHF (congestive heart failure) (HCC)    due to ischemic CM  a. EF 20-30%. (Nov 2008)   b. s/p St. Jude BiV-ICD    c. CPX 07/2008  pvo2 16.3 (63% predicted) slope 34 RER 1.08 O2 pulse 93%  . Cough    occasional /productive, no fever/ not new  . CRI (chronic renal insufficiency)    (baseline 2.0-2.2)/ recent hospitalization 8/13  . GERD (gastroesophageal reflux disease)    hx Barretts esophagitis  . History of blood transfusion    following coumadin  usage  . HTN (hypertension)    EKG 05/14/12,Chest x ray 8/13, last ICD interrogation 8/13 EPIC  . Hyperlipidemia   . Hypothyroidism   . 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 Neuromuscular disorder ([place])    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"   no neurologist  . Skin cancer    basal cells   facial x 4, 1 right forearm  . Sleep apnea    STOP BANG SCORE 4    Current Outpatient Medications  Medication Sig Dispense Refill  . acetaminophen (TYLENOL) 500 MG tablet Take 500-1,000 mg by mouth every 6 (six) hours as needed (pain.).    Marland Kitchen amiodarone (PACERONE) 200 MG tablet TAKE 1  TABLET BY MOUTH EVERY DAY (Patient taking differently: 100 mg daily. 1/2 tablet) 90 tablet 3  . carboxymethylcellulose (REFRESH PLUS) 0.5 % SOLN Place 1 drop into both eyes 3 (three) times daily as needed (dry/irritated eyes.).    Marland Kitchen carvedilol (COREG) 6.25 MG tablet TAKE 1 TABLET (6.25 MG TOTAL) BY MOUTH 2 (TWO) TIMES DAILY WITH A MEAL. 180 tablet 0  . dapagliflozin propanediol (FARXIGA) 5 MG TABS tablet Take 10 mg by mouth daily.    Marland Kitchen ELIQUIS 2.5 MG TABS tablet TAKE 1 TABLET BY MOUTH TWICE A DAY (Patient taking differently: Take 2.5 mg by mouth 2 (two) times daily.) 180 tablet 3  . ezetimibe (ZETIA) 10 MG tablet TAKE ONE TABLET BY MOUTH ONCE DAILY WITH SUPPER (Patient taking differently:  Take 10 mg by mouth every evening.) 90 tablet 3  . ferrous sulfate 325 (65 FE) MG tablet Take 325 mg by mouth daily with supper.    . furosemide (LASIX) 20 MG tablet Take 2 tablets (40 mg total) by mouth daily. 180 tablet 2  . levothyroxine (SYNTHROID) 75 MCG tablet Take 1 tablet (75 mcg total) by mouth daily before breakfast. 30 tablet 11  . Multiple Vitamin (MULTIVITAMIN WITH MINERALS) TABS tablet Take 1 tablet by mouth every evening. Centrum Silver    . nitroGLYCERIN (NITROSTAT) 0.4 MG SL tablet DISSOLVE ONE TABLET UNDER THE TONGUE EVERY 5 MINUTES AS NEEDED FOR CHEST PAIN.  DO NOT EXCEED A TOTAL OF 3 DOSES IN 15 MINUTES (Patient taking differently: Place 0.4 mg under the tongue every 5 (five) minutes x 3 doses as needed for chest pain.) 25 tablet 0  . pantoprazole (PROTONIX) 40 MG tablet Take 40 mg by mouth daily before breakfast.    . potassium chloride (K-DUR,KLOR-CON) 10 MEQ tablet Take 1 tablet (10 mEq total) by mouth daily. 30 tablet 0  . rosuvastatin (CRESTOR) 20 MG tablet Take 1 tablet (20 mg total) by mouth daily. (Patient taking differently: Take 20 mg by mouth every evening.) 90 tablet 3  . sodium chloride (OCEAN) 0.65 % SOLN nasal spray Place 1 spray into both nostrils as needed for congestion.    . tamsulosin (FLOMAX) 0.4 MG CAPS capsule Take 0.4 mg by mouth at bedtime.      No current facility-administered medications for this encounter.   PHYSICAL EXAM: Vitals:   02/15/21 1403  BP: 122/60  Pulse: 73  SpO2: 96%  Weight: 83.8 kg (184 lb 12.8 oz)     Wt Readings from Last 3 Encounters:  02/15/21 83.8 kg (184 lb 12.8 oz)  01/08/21 85.3 kg (188 lb)  10/02/20 81.6 kg (180 lb)    General:  Well appearing. No resp difficulty. Walked in the clinic.  HEENT: normal Neck: supple. no JVD. Carotids 2+ bilat; no bruits. No lymphadenopathy or thryomegaly appreciated. Cor: PMI nondisplaced. Regular rate & rhythm. No rubs, gallops or murmurs. Lungs: clear Abdomen: soft, nontender,  nondistended. No hepatosplenomegaly. No bruits or masses. Good bowel sounds. Extremities: no cyanosis, clubbing, rash, edema Neuro: alert & orientedx3, cranial nerves grossly intact. moves all 4 extremities w/o difficulty. Affect pleasant  EKG: AV dual paced 60 bpm   ASSESSMENT & PLAN: 1. Chronic systolic CHF. Echo 10/13/2017 LVEF 20-25% (Stable from last year). Echo 2/19 EF 20-25% - Stable chronic NYHA II-III symptoms.  Volume status stable. Continue lasix 40 mg daily.  - BP too low to add valsartan back especially with CKD IV. - Continue coreg 6.25 mg BID.  -Continue farxiga 10 mg daily.  2. CAD - No chest pain.  - Off ASA due to Eliquis.  - Continue statin. Lipids followed by Dr. Virgina [name]. Goal LDL < 70  3. PAF  - In NSR today.  - Stop amio due to tremors.  - Denies bleeding. Continue Eliquis 2.5 mg BID.   4. Carotid ultrasound  - 1-39% bilaterally on last scan 3/15. No repeat warranted.   no change  5. CKD, Stage IV  - Followed by Dr [name].   6. Essential Tremors  - Followed by PCP. Had a neurologic work up and negative for [employer].   - Stop amio today.   Follow up with Dr [name] in 6 months. He has labs next week with PCP. Will ask for results from Dr [name].    Darrick Grinder, NP  2:19 PM

## 2021-02-15 NOTE — Patient Instructions (Signed)
Good to see you! Please stop Amiodarone Remind PCP to fax blood work to [place] Follow-up with Dr. Haroldine Laws in 6 months --please call our office [phone] to schedule

## 2021-02-20 ENCOUNTER — Other Ambulatory Visit: Payer: Self-pay | Admitting: Internal Medicine

## 2021-02-21 NOTE — Telephone Encounter (Signed)
Pt's pharmacy  Is requesting a refill on levothyroxine. Would Dr. Caryl Comes like to refill this medication? Please address

## 2021-02-22 ENCOUNTER — Telehealth: Payer: Self-pay | Admitting: Internal Medicine

## 2021-02-22 MED ORDER — LEVOTHYROXINE SODIUM 75 MCG PO TABS: 75.0000 ug | ORAL_TABLET | Freq: Every day | ORAL | 0 refills | Status: DC

## 2021-02-22 NOTE — Telephone Encounter (Signed)
*  STAT* If patient is at the pharmacy, call can be transferred to refill team.   1. Which medications need to be refilled? (please list name of each medication and dose if known)  levothyroxine (SYNTHROID) 75 MCG tablet  2. Which pharmacy/location (including street and city if local pharmacy) is medication to be sent to? [id number] - [place], [place] [address]   3. Do they need a 30 day or 90 day supply? 90 day supply

## 2021-02-22 NOTE — Telephone Encounter (Signed)
Pt to have labs drawn at his PCP office in June and send to Dr Caryl Comes.

## 2021-02-22 NOTE — Telephone Encounter (Signed)
Pt's medication was sent to pt's pharmacy as requested. Confirmation received.  °

## 2021-02-22 NOTE — Telephone Encounter (Signed)
Pt calling requesting a refill on levothyroxine. Would Dr. Caryl Comes like to refill this medication? Please address

## 2021-02-26 ENCOUNTER — Other Ambulatory Visit (INDEPENDENT_AMBULATORY_CARE_PROVIDER_SITE_OTHER): Payer: Self-pay | Admitting: Otolaryngology

## 2021-03-05 ENCOUNTER — Other Ambulatory Visit: Payer: Self-pay | Admitting: Internal Medicine

## 2021-03-05 ENCOUNTER — Other Ambulatory Visit (HOSPITAL_COMMUNITY): Payer: Self-pay | Admitting: Internal Medicine

## 2021-03-05 DIAGNOSIS — I480 Paroxysmal atrial fibrillation: Secondary | ICD-10-CM | POA: Diagnosis not present

## 2021-03-05 DIAGNOSIS — M25512 Pain in left shoulder: Secondary | ICD-10-CM | POA: Diagnosis not present

## 2021-03-05 DIAGNOSIS — E039 Hypothyroidism, unspecified: Secondary | ICD-10-CM | POA: Diagnosis not present

## 2021-03-05 DIAGNOSIS — M1990 Unspecified osteoarthritis, unspecified site: Secondary | ICD-10-CM | POA: Diagnosis not present

## 2021-03-05 DIAGNOSIS — D696 Thrombocytopenia, unspecified: Secondary | ICD-10-CM | POA: Diagnosis not present

## 2021-03-05 DIAGNOSIS — C679 Malignant neoplasm of bladder, unspecified: Secondary | ICD-10-CM | POA: Diagnosis not present

## 2021-03-05 DIAGNOSIS — N184 Chronic kidney disease, stage 4 (severe): Secondary | ICD-10-CM | POA: Diagnosis not present

## 2021-03-05 DIAGNOSIS — E785 Hyperlipidemia, unspecified: Secondary | ICD-10-CM | POA: Diagnosis not present

## 2021-03-05 DIAGNOSIS — J449 Chronic obstructive pulmonary disease, unspecified: Secondary | ICD-10-CM | POA: Diagnosis not present

## 2021-03-05 DIAGNOSIS — I5022 Chronic systolic (congestive) heart failure: Secondary | ICD-10-CM | POA: Diagnosis not present

## 2021-03-05 DIAGNOSIS — I255 Ischemic cardiomyopathy: Secondary | ICD-10-CM | POA: Diagnosis not present

## 2021-03-05 DIAGNOSIS — I6529 Occlusion and stenosis of unspecified carotid artery: Secondary | ICD-10-CM | POA: Diagnosis not present

## 2021-03-05 DIAGNOSIS — I130 Hypertensive heart and chronic kidney disease with heart failure and stage 1 through stage 4 chronic kidney disease, or unspecified chronic kidney disease: Secondary | ICD-10-CM | POA: Diagnosis not present

## 2021-03-05 DIAGNOSIS — I2510 Atherosclerotic heart disease of native coronary artery without angina pectoris: Secondary | ICD-10-CM | POA: Diagnosis not present

## 2021-03-05 DIAGNOSIS — I700 Atherosclerosis of aorta: Secondary | ICD-10-CM | POA: Diagnosis not present

## 2021-04-02 ENCOUNTER — Ambulatory Visit (INDEPENDENT_AMBULATORY_CARE_PROVIDER_SITE_OTHER): Payer: Medicare Other

## 2021-04-02 DIAGNOSIS — I255 Ischemic cardiomyopathy: Secondary | ICD-10-CM | POA: Diagnosis not present

## 2021-04-03 LAB — CUP PACEART REMOTE DEVICE CHECK
Battery Remaining Longevity: 64 mo
Battery Remaining Percentage: 86 %
Battery Voltage: 3.01 V
Brady Statistic AP VP Percent: 94 %
Brady Statistic AP VS Percent: 1 %
Brady Statistic AS VP Percent: 5.9 %
Brady Statistic AS VS Percent: 1 %
Brady Statistic RA Percent Paced: 94 %
Date Time Interrogation Session: 20220620020016
HighPow Impedance: 52 Ohm
HighPow Impedance: 53 Ohm
Implantable Lead Implant Date: 20060509
Implantable Lead Implant Date: 20060509
Implantable Lead Implant Date: 20090417
Implantable Lead Location: 753858
Implantable Lead Location: 753859
Implantable Lead Location: 753860
Implantable Lead Model: 5076
Implantable Lead Model: 7001
Implantable Pulse Generator Implant Date: 20211220
Lead Channel Impedance Value: 400 Ohm
Lead Channel Impedance Value: 440 Ohm
Lead Channel Impedance Value: 450 Ohm
Lead Channel Pacing Threshold Amplitude: 0.875 V
Lead Channel Pacing Threshold Amplitude: 1 V
Lead Channel Pacing Threshold Amplitude: 1 V
Lead Channel Pacing Threshold Pulse Width: 0.5 ms
Lead Channel Pacing Threshold Pulse Width: 0.8 ms
Lead Channel Pacing Threshold Pulse Width: 0.8 ms
Lead Channel Sensing Intrinsic Amplitude: 11.7 mV
Lead Channel Sensing Intrinsic Amplitude: 3.1 mV
Lead Channel Setting Pacing Amplitude: 2 V
Lead Channel Setting Pacing Amplitude: 2 V
Lead Channel Setting Pacing Amplitude: 2 V
Lead Channel Setting Pacing Pulse Width: 0.8 ms
Lead Channel Setting Pacing Pulse Width: 0.8 ms
Lead Channel Setting Sensing Sensitivity: 0.5 mV
Pulse Gen Serial Number: 9905796

## 2021-04-05 ENCOUNTER — Other Ambulatory Visit (HOSPITAL_COMMUNITY): Payer: Self-pay | Admitting: Internal Medicine

## 2021-04-13 ENCOUNTER — Telehealth (HOSPITAL_COMMUNITY): Payer: Self-pay | Admitting: *Deleted

## 2021-04-13 NOTE — Telephone Encounter (Signed)
[facility] called to get med hold clearance for dental extraction. Pt needs to hold eliquis. Fax given to Dr.Bensimhon to sign.   Fax # [phone]

## 2021-04-20 ENCOUNTER — Other Ambulatory Visit: Payer: Self-pay | Admitting: Internal Medicine

## 2021-04-20 NOTE — Progress Notes (Signed)
Remote ICD transmission.   

## 2021-04-24 ENCOUNTER — Ambulatory Visit (INDEPENDENT_AMBULATORY_CARE_PROVIDER_SITE_OTHER): Payer: Medicare Other | Admitting: Podiatry

## 2021-04-24 ENCOUNTER — Encounter: Payer: Self-pay | Admitting: Podiatry

## 2021-04-24 ENCOUNTER — Other Ambulatory Visit: Payer: Self-pay

## 2021-04-24 DIAGNOSIS — M79675 Pain in left toe(s): Secondary | ICD-10-CM

## 2021-04-24 DIAGNOSIS — B351 Tinea unguium: Secondary | ICD-10-CM | POA: Diagnosis not present

## 2021-04-24 DIAGNOSIS — M79674 Pain in right toe(s): Secondary | ICD-10-CM | POA: Diagnosis not present

## 2021-04-25 ENCOUNTER — Ambulatory Visit (INDEPENDENT_AMBULATORY_CARE_PROVIDER_SITE_OTHER): Payer: Medicare Other | Admitting: Otolaryngology

## 2021-04-25 DIAGNOSIS — I255 Ischemic cardiomyopathy: Secondary | ICD-10-CM

## 2021-04-25 DIAGNOSIS — H6522 Chronic serous otitis media, left ear: Secondary | ICD-10-CM | POA: Diagnosis not present

## 2021-04-25 DIAGNOSIS — H903 Sensorineural hearing loss, bilateral: Secondary | ICD-10-CM | POA: Diagnosis not present

## 2021-04-25 NOTE — Progress Notes (Signed)
HPI: [name] is a 87 y.o. male who returns today for evaluation of decreased hearing in the left ear.  He is having some popping sound in the left ear.  He has underlying bilateral SNHL.  He has had longstanding problems with recurrent left sided serous otitis media.  He has had several T tubes placed in the past.  The last one was placed in March of last year..  Past Medical History:  Diagnosis Date   Acute bronchitis with COPD ([place]) 09/10/2016   Anemia    Atrial fibrillation or flutter    maintaining sinus on amiodarone     Bladder cancer ([place]) dx'd 05/2012   BPH (benign prostatic hyperplasia)    CAD (coronary artery disease)     a. s/p anterior MI 12/05 c/b shock -> stent LAD   b. s/p stenting OM-1, 2/06   CHF (congestive heart failure) ([place])    due to ischemic CM  a. EF 20-30%. (Nov 2008)   b. s/p St. Jude BiV-ICD    c. 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/16/2013   Neuromuscular disorder ([place])    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"   no neurologist   Skin cancer    basal cells   facial x 4, 1 right forearm   Sleep apnea    STOP BANG SCORE 4   Past Surgical History:  Procedure Laterality Date   BACK SURGERY  1972   lower back   BIV ICD GENERATOR CHANGEOUT N/A 10/02/2020   Procedure: BIV ICD GENERATOR CHANGEOUT;  Surgeon: Deboraha Sprang, MD;  Location: [facility];  Service: Cardiovascular;  Laterality: N/A;   CARDIAC DEFIBRILLATOR PLACEMENT     ICD St Jude; gen change  09-20-13   CERVICAL LAMINECTOMY  1985   COLONOSCOPY     CORONARY ANGIOPLASTY  2005,2006   CYSTOSCOPY W/ RETROGRADES  05/25/2012   Procedure: CYSTOSCOPY WITH RETROGRADE PYELOGRAM;  Surgeon: Molli Hazard, MD;  Location: [facility];  Service: Urology;  Laterality: Bilateral;   CYSTOSCOPY W/ URETERAL STENT PLACEMENT  07/08/2012   Procedure: CYSTOSCOPY WITH STENT REPLACEMENT;  Surgeon: Molli Hazard, MD;  Location: [facility];  Service: Urology;  Laterality: Left;   CYSTOSCOPY W/ URETERAL STENT REMOVAL  07/08/2012   Procedure: CYSTOSCOPY WITH STENT REMOVAL;  Surgeon: Molli Hazard, MD;  Location: [facility];  Service: Urology;  Laterality: Bilateral;   CYSTOSCOPY/RETROGRADE/URETEROSCOPY  07/08/2012   Procedure: CYSTOSCOPY/RETROGRADE/URETEROSCOPY;  Surgeon: Molli Hazard, MD;  Location: [facility];  Service: Urology;  Laterality: Left;   ESOPHAGOGASTRODUODENOSCOPY     HERNIA REPAIR  1989   bilateral   IMPLANTABLE CARDIOVERTER DEFIBRILLATOR (ICD) GENERATOR CHANGE N/A 09/20/2013   Procedure: ICD GENERATOR CHANGE;  Surgeon: Deboraha Sprang, MD;  Location: [facility];  Service: Cardiovascular;  Laterality: N/A;   TEE WITHOUT CARDIOVERSION N/A 12/10/2017   Procedure: TRANSESOPHAGEAL ECHOCARDIOGRAM (TEE);  Surgeon: Jerline Pain, MD;  Location: [facility]  ENDOSCOPY;  Service: Cardiovascular;  Laterality: N/A;   TRANSURETHRAL RESECTION OF BLADDER TUMOR  05/25/2012   cold cup biopsy prostate   TRANSURETHRAL RESECTION OF BLADDER TUMOR  07/08/2012   Procedure: TRANSURETHRAL RESECTION OF BLADDER TUMOR (TURBT);  Surgeon: Molli Hazard, MD;  Location: [facility];  Service: Urology;  Laterality: N/A;  CYSTO, TURBT W/ GYRUS, BILATERAL STENT REMOVAL, LEFT URETEROSCOPY WITH BIOPSY, POSSIBLE LEFT STENT PLACEMENT    Social History   Socioeconomic History   Marital status: Married    Spouse name: Not on file   Number of children: Not on file   Years of education: Not on file   Highest education level: Not  on file  Occupational History   Occupation: retired  Tobacco Use   Smoking status: Former    Pack years: 0.00    Types: Cigarettes, Cigars    Quit date: 03/02/2004    Years since quitting: 17.1   Smokeless tobacco: Former    Quit date: 05/20/1999   Tobacco comments:    quit in 2005  Vaping Use   Vaping Use: Never used  Substance and Sexual Activity   Alcohol use: No   Drug use: No   Sexual activity: Not on file  Other Topics Concern   Not on file  Social History Narrative   Not on file   Social Determinants of Health   Financial Resource Strain: Not on file  Food Insecurity: Not on file  Transportation Needs: Not on file  Physical Activity: Not on file  Stress: Not on file  Social Connections: Not on file   Family History  Problem Relation Age of Onset   Coronary artery disease Other    Stroke Other    Allergies  Allergen Reactions   Ramipril Cough   Penicillins Itching and Rash    Has patient had a PCN reaction causing immediate rash, facial/tongue/throat swelling, SOB or lightheadedness with hypotension: No Has patient had a PCN reaction causing severe rash involving mucus membranes or skin necrosis: Yes-back and hands. Has patient had a PCN reaction that required hospitalization: No Has patient had a PCN reaction occurring within the last 10 years: Yes If all of the above answers are "NO", then may proceed with Cephalosporin use.   Prior to Admission medications   Medication Sig Start Date End Date Taking? Authorizing Provider  acetaminophen (TYLENOL) 500 MG tablet Take 500-1,000 mg by mouth every 6 (six) hours as needed (pain.).    [provider]  carboxymethylcellulose (REFRESH PLUS) 0.5 % SOLN Place 1 drop into both eyes 3 (three) times daily as needed (dry/irritated eyes.).    [provider]  carvedilol (COREG) 6.25 MG tablet TAKE 1 TABLET BY MOUTH 2 TIMES DAILY WITH A MEAL. 03/06/21   Bensimhon, Shaune Pascal, MD  cephALEXin (KEFLEX) 500 MG  capsule Take 4 capsules by mouth once. Take 45 minutes before dental procedure on tomorrow 04/19/21   [provider]  ELIQUIS 2.5 MG TABS tablet TAKE 1 TABLET BY MOUTH TWICE A DAY Patient taking differently: Take 2.5 mg by mouth 2 (two) times daily. 04/25/20   Bensimhon, Shaune Pascal, MD  ezetimibe (ZETIA) 10 MG tablet TAKE ONE TABLET BY MOUTH ONCE DAILY WITH SUPPER Patient taking differently: Take 10 mg by mouth every evening. 03/06/17   Larey Dresser, MD  FARXIGA 10 MG TABS tablet Take 10 mg by mouth every morning. 03/22/21   [provider]  ferrous sulfate 325 (65 FE) MG tablet Take 325 mg by  mouth daily with supper.    [provider]  furosemide (LASIX) 20 MG tablet Take 2 tablets (40 mg total) by mouth daily. 12/11/17   Bensimhon, Shaune Pascal, MD  Multiple Vitamin (MULTIVITAMIN WITH MINERALS) TABS tablet Take 1 tablet by mouth every evening. Centrum Silver    [provider]  nitroGLYCERIN (NITROSTAT) 0.4 MG SL tablet DISSOLVE ONE TABLET UNDER THE TONGUE EVERY 5 MINUTES AS NEEDED FOR CHEST PAIN.  DO NOT EXCEED A TOTAL OF 3 DOSES IN 15 MINUTES Patient taking differently: Place 0.4 mg under the tongue every 5 (five) minutes x 3 doses as needed for chest pain. 12/26/16   Larey Dresser, MD  pantoprazole (PROTONIX) 40 MG tablet Take 40 mg by mouth daily before breakfast. 01/15/11   [provider]  potassium chloride (K-DUR,KLOR-CON) 10 MEQ tablet Take 1 tablet (10 mEq total) by mouth daily. 12/13/17   Velvet Bathe, MD  rosuvastatin (CRESTOR) 20 MG tablet Take 1 tablet (20 mg total) by mouth daily. Patient taking differently: Take 20 mg by mouth every evening. 10/18/15   Bensimhon, Shaune Pascal, MD  sodium chloride (OCEAN) 0.65 % SOLN nasal spray Place 1 spray into both nostrils as needed for congestion.    [provider]  SYNTHROID 75 MCG tablet TAKE 1 TABLET BY MOUTH DAILY BEFORE BREAKFAST. 04/20/21   Deboraha Sprang, MD  tamsulosin (FLOMAX) 0.4 MG CAPS  capsule Take 0.4 mg by mouth at bedtime.  11/23/13   [provider]  triamcinolone (NASACORT) 55 MCG/ACT AERO nasal inhaler USE 2 SPRAYS IN [facility] NOSTRIL ONCE DAILY AT NIGHT 02/26/21   Rozetta Nunnery, MD     Positive ROS: Otherwise negative  All other systems have been reviewed and were otherwise negative with the exception of those mentioned in the HPI and as above.  Physical Exam: Constitutional: Alert, well-appearing, no acute distress Ears: External ears without lesions or tenderness.  Right ear canal right TM are clear.  The left T-tube is extruded lying within the ear canal and this was removed.  He has a recurrent left serous otitis.  When he had repeated left M&T in the office today using a modified T-tube.  A myringotomy was made in the anterior portion of the TM a serous effusion was aspirated and a modified T-tube was inserted without difficulty.  This was followed by Ciprodex eardrops. Nasal: External nose without lesions.. Clear nasal passages Oral: Lips and gums without lesions. Tongue and palate mucosa without lesions. Posterior oropharynx clear. Neck: No palpable adenopathy or masses Respiratory: Breathing comfortably  Skin: No facial/neck lesions or rash noted.  Myringotomy  Date/Time: 04/25/2021 12:53 PM Performed by: Rozetta Nunnery, MD Authorized by: Rozetta Nunnery, MD   Consent:    Consent obtained:  Verbal   Consent given by:  Patient Pre-procedure details:    Indications: serous otitis media   Anesthesia:    Anesthesia method:  Topical application   Topical anesthetic:  Phenol Procedure Details:    Location:  Left TM   Hole made in:  Anterosuperior aspect of TM   Tube:  T-tube Findings:    Fluid:  Serous fluid Comments:     Phenol was uses topical anesthetic.  A myringotomy was made the anterior portion of the TM and a serous effusion was aspirated.  A modified T-tube was inserted without difficulty.  Assessment: Chronic left  serous otitis media  Plan: M&T was performed in the office using a modified T-tube. He will follow-up in  2 weeks for recheck. Applied Ciprodex drops to the left ear in the office. Called in Ciprodex to his pharmacy if he has any persistent drainage tomorrow.   Radene Journey, MD

## 2021-04-27 NOTE — Progress Notes (Signed)
Subjective: [name] is a pleasant 87 y.o. male patient seen today painful thick toenails that are difficult to trim. Pain interferes with ambulation. Aggravating factors include wearing enclosed shoe gear. Pain is relieved with periodic professional debridement.  He is accompanied by his wife on today's visit. Wife states he has been off of his blood thinner for 3 days. He is having some dental work done.  PCP is [name], MD. Last visit was: 09/01/2020.  Allergies  Allergen Reactions   Ramipril Cough   Penicillins Itching and Rash    Has patient had a PCN reaction causing immediate rash, facial/tongue/throat swelling, SOB or lightheadedness with hypotension: No Has patient had a PCN reaction causing severe rash involving mucus membranes or skin necrosis: Yes-back and hands. Has patient had a PCN reaction that required hospitalization: No Has patient had a PCN reaction occurring within the last 10 years: Yes If all of the above answers are "NO", then may proceed with Cephalosporin use.    Objective: Physical Exam  General: [name] is a pleasant 87 y.o. Caucasian male, WD, WN in NAD. AAO x 3.   Vascular:  Capillary fill time to digits <3 seconds b/l lower extremities. Palpable pedal pulses b/l LE. Pedal hair absent. Lower extremity skin temperature gradient within normal limits. No pain with calf compression b/l. No edema noted b/l lower extremities.  Dermatological:  Pedal skin with normal turgor, texture and tone b/l lower extremities No open wounds b/l lower extremities No interdigital macerations b/l lower extremities Toenails 1-5 b/l elongated, discolored, dystrophic, thickened, crumbly with subungual debris and tenderness to dorsal palpation.  Musculoskeletal:  Normal muscle strength 5/5 to all lower extremity muscle groups bilaterally. No pain crepitus or joint limitation noted with ROM b/l. Hallux valgus with bunion deformity noted b/l lower  extremities.  Neurological:  Protective sensation intact 5/5 intact bilaterally with 10g monofilament b/l. Vibratory sensation intact b/l.  Assessment and Plan:  1. Pain due to onychomycosis of toenails of both feet     -Patient to continue soft, supportive shoe gear daily. -Toenails 1-5 b/l were debrided in length and girth with sterile nail nippers and dremel without iatrogenic bleeding.  -Patient to report any pedal injuries to medical professional immediately. -Patient/POA to call should there be question/concern in the interim.  Return in about 3 months (around 07/25/2021).  Marzetta Board, DPM

## 2021-05-02 ENCOUNTER — Ambulatory Visit (INDEPENDENT_AMBULATORY_CARE_PROVIDER_SITE_OTHER): Payer: Medicare Other | Admitting: Otolaryngology

## 2021-05-02 ENCOUNTER — Other Ambulatory Visit: Payer: Self-pay

## 2021-05-02 DIAGNOSIS — Z4889 Encounter for other specified surgical aftercare: Secondary | ICD-10-CM

## 2021-05-02 NOTE — Progress Notes (Signed)
HPI: [name] is a 87 y.o. male who presents 7 days s/p placement of a left T-tube for chronic left serous otitis media.  He is doing well with no drainage from the ear..   Past Medical History:  Diagnosis Date   Acute bronchitis with COPD ([place]) 09/10/2016   Anemia    Atrial fibrillation or flutter    maintaining sinus on amiodarone     Bladder cancer ([place]) dx'd 05/2012   BPH (benign prostatic hyperplasia)    CAD (coronary artery disease)     a. s/p anterior MI 12/05 c/b shock -> stent LAD   b. s/p stenting OM-1, 2/06   CHF (congestive heart failure) ([place])    due to ischemic CM  a. EF 20-30%. (Nov 2008)   b. s/p St. Jude BiV-ICD    c. 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"   no neurologist   Skin cancer    basal cells   facial x 4, 1 right forearm   Sleep apnea    STOP BANG SCORE 4   Past Surgical History:  Procedure Laterality Date   BACK SURGERY  1972   lower back   BIV ICD GENERATOR CHANGEOUT N/A 10/02/2020   Procedure: BIV ICD GENERATOR CHANGEOUT;  Surgeon: Deboraha Sprang, MD;  Location: [place] [facility];  Service: Cardiovascular;  Laterality: N/A;   CARDIAC DEFIBRILLATOR PLACEMENT     ICD St Jude; gen change 09-20-13   CERVICAL LAMINECTOMY  1985   COLONOSCOPY     CORONARY ANGIOPLASTY  2005,2006   CYSTOSCOPY W/ RETROGRADES  05/25/2012   Procedure: CYSTOSCOPY WITH RETROGRADE PYELOGRAM;  Surgeon: Molli Hazard, MD;  Location: [facility];  Service: Urology;  Laterality: Bilateral;   CYSTOSCOPY W/ URETERAL STENT PLACEMENT  07/08/2012   Procedure: CYSTOSCOPY WITH STENT REPLACEMENT;  Surgeon: Molli Hazard, MD;  Location: [facility];  Service: Urology;  Laterality: Left;   CYSTOSCOPY W/ URETERAL STENT REMOVAL  07/08/2012   Procedure: CYSTOSCOPY WITH STENT REMOVAL;  Surgeon: Molli Hazard, MD;  [facility];  Service: Urology;  Laterality: Bilateral;   CYSTOSCOPY/RETROGRADE/URETEROSCOPY  07/08/2012   Procedure: CYSTOSCOPY/RETROGRADE/URETEROSCOPY;  Surgeon: Molli Hazard, MD;  [facility];  Service: Urology;  Laterality: Left;   ESOPHAGOGASTRODUODENOSCOPY     HERNIA REPAIR  1989   bilateral   IMPLANTABLE CARDIOVERTER DEFIBRILLATOR (ICD) GENERATOR CHANGE N/A 09/20/2013   Procedure: ICD GENERATOR CHANGE;  Surgeon: Deboraha Sprang, MD;  Location: [facility];  Service: Cardiovascular;  Laterality: N/A;   TEE WITHOUT CARDIOVERSION N/A 12/10/2017   Procedure: TRANSESOPHAGEAL ECHOCARDIOGRAM (TEE);  Surgeon: Jerline Pain, MD;  Location: [facility] ENDOSCOPY;  Service: Cardiovascular;  Laterality: N/A;   TRANSURETHRAL RESECTION OF BLADDER TUMOR  05/25/2012   cold cup biopsy prostate   TRANSURETHRAL RESECTION OF BLADDER TUMOR  07/08/2012   Procedure: TRANSURETHRAL RESECTION  OF BLADDER TUMOR (TURBT);  Surgeon: Molli Hazard, MD;  Location: [facility];  Service: Urology;  Laterality: N/A;  CYSTO, TURBT W/ GYRUS, BILATERAL STENT REMOVAL, LEFT URETEROSCOPY WITH BIOPSY, POSSIBLE LEFT STENT PLACEMENT    Social History   Socioeconomic History   Marital status: Married    Spouse name: Not on file   Number of children: Not on file   Years of education: Not on file   Highest education level: Not on file  Occupational History   Occupation: retired  Tobacco Use   Smoking status: Former    Types: Cigarettes, Cigars    Quit date: 03/02/2004    Years since quitting: 17.1   Smokeless  tobacco: Former    Quit date: 05/20/1999   Tobacco comments:    quit in 2005  Vaping Use   Vaping Use: Never used  Substance and Sexual Activity   Alcohol use: No   Drug use: No   Sexual activity: Not on file  Other Topics Concern   Not on file  Social History Narrative   Not on file   Social Determinants of Health   Financial Resource Strain: Not on file  Food Insecurity: Not on file  Transportation Needs: Not on file  Physical Activity: Not on file  Stress: Not on file  Social Connections: Not on file   Family History  Problem Relation Age of Onset   Coronary artery disease Other    Stroke Other    Allergies  Allergen Reactions   Ramipril Cough   Penicillins Itching and Rash    Has patient had a PCN reaction causing immediate rash, facial/tongue/throat swelling, SOB or lightheadedness with hypotension: No Has patient had a PCN reaction causing severe rash involving mucus membranes or skin necrosis: Yes-back and hands. Has patient had a PCN reaction that required hospitalization: No Has patient had a PCN reaction occurring within the last 10 years: Yes If all of the above answers are "NO", then may proceed with Cephalosporin use.   Prior to Admission medications   Medication Sig Start Date End Date Taking? Authorizing Provider  acetaminophen (TYLENOL) 500 MG tablet Take 500-1,000 mg by mouth every 6 (six) hours as needed (pain.).    [provider]  carboxymethylcellulose (REFRESH PLUS) 0.5 % SOLN Place 1 drop into both eyes 3 (three) times daily as needed (dry/irritated eyes.).    [provider]  carvedilol (COREG) 6.25 MG tablet TAKE 1 TABLET BY MOUTH 2 TIMES DAILY WITH A MEAL. 03/06/21   Bensimhon, Shaune Pascal, MD  cephALEXin (KEFLEX) 500 MG capsule Take 4 capsules by mouth once. Take 45 minutes before dental procedure on tomorrow 04/19/21   [provider]  ELIQUIS 2.5 MG TABS tablet TAKE 1 TABLET BY MOUTH TWICE A DAY Patient taking differently:  Take 2.5 mg by mouth 2 (two) times daily. 04/25/20   Bensimhon, Shaune Pascal, MD  ezetimibe (ZETIA) 10 MG tablet TAKE ONE TABLET BY MOUTH ONCE DAILY WITH SUPPER Patient taking differently: Take 10 mg by mouth every evening. 03/06/17   Larey Dresser, MD  FARXIGA 10 MG TABS tablet Take 10 mg by mouth every morning. 03/22/21   [provider]  ferrous sulfate 325 (65 FE) MG tablet Take 325 mg by mouth daily with supper.    [provider]  furosemide (LASIX) 20 MG tablet Take 2 tablets (40 mg total) by mouth daily. 12/11/17   Bensimhon, Shaune Pascal, MD  Multiple Vitamin (MULTIVITAMIN WITH MINERALS) TABS tablet Take 1  tablet by mouth every evening. Centrum Silver    [provider]  nitroGLYCERIN (NITROSTAT) 0.4 MG SL tablet DISSOLVE ONE TABLET UNDER THE TONGUE EVERY 5 MINUTES AS NEEDED FOR CHEST PAIN.  DO NOT EXCEED A TOTAL OF 3 DOSES IN 15 MINUTES Patient taking differently: Place 0.4 mg under the tongue every 5 (five) minutes x 3 doses as needed for chest pain. 12/26/16   Larey Dresser, MD  pantoprazole (PROTONIX) 40 MG tablet Take 40 mg by mouth daily before breakfast. 01/15/11   [provider]  potassium chloride (K-DUR,KLOR-CON) 10 MEQ tablet Take 1 tablet (10 mEq total) by mouth daily. 12/13/17   Velvet Bathe, MD  rosuvastatin (CRESTOR) 20 MG tablet Take 1 tablet (20 mg total) by mouth daily. Patient taking differently: Take 20 mg by mouth every evening. 10/18/15   Bensimhon, Shaune Pascal, MD  sodium chloride (OCEAN) 0.65 % SOLN nasal spray Place 1 spray into both nostrils as needed for congestion.    [provider]  SYNTHROID 75 MCG tablet TAKE 1 TABLET BY MOUTH DAILY BEFORE BREAKFAST. 04/20/21   Deboraha Sprang, MD  tamsulosin (FLOMAX) 0.4 MG CAPS capsule Take 0.4 mg by mouth at bedtime.  11/23/13   [provider]  triamcinolone (NASACORT) 55 MCG/ACT AERO nasal inhaler USE 2 SPRAYS IN [facility] NOSTRIL ONCE DAILY AT NIGHT 02/26/21   Rozetta Nunnery, MD      Physical Exam: The T-tube is intact and clear with no drainage.   Assessment: S/p placement of a left T-tube that had extruded  Plan: He will follow-up as needed.   Radene Journey, MD

## 2021-05-17 DIAGNOSIS — N184 Chronic kidney disease, stage 4 (severe): Secondary | ICD-10-CM | POA: Diagnosis not present

## 2021-05-23 DIAGNOSIS — I4891 Unspecified atrial fibrillation: Secondary | ICD-10-CM | POA: Diagnosis not present

## 2021-05-23 DIAGNOSIS — Z8551 Personal history of malignant neoplasm of bladder: Secondary | ICD-10-CM | POA: Diagnosis not present

## 2021-05-23 DIAGNOSIS — N184 Chronic kidney disease, stage 4 (severe): Secondary | ICD-10-CM | POA: Diagnosis not present

## 2021-05-23 DIAGNOSIS — E785 Hyperlipidemia, unspecified: Secondary | ICD-10-CM | POA: Diagnosis not present

## 2021-05-23 DIAGNOSIS — I129 Hypertensive chronic kidney disease with stage 1 through stage 4 chronic kidney disease, or unspecified chronic kidney disease: Secondary | ICD-10-CM | POA: Diagnosis not present

## 2021-05-23 DIAGNOSIS — I509 Heart failure, unspecified: Secondary | ICD-10-CM | POA: Diagnosis not present

## 2021-06-07 ENCOUNTER — Other Ambulatory Visit (HOSPITAL_COMMUNITY): Payer: Self-pay | Admitting: Internal Medicine

## 2021-06-21 ENCOUNTER — Other Ambulatory Visit: Payer: Self-pay

## 2021-06-21 ENCOUNTER — Ambulatory Visit (INDEPENDENT_AMBULATORY_CARE_PROVIDER_SITE_OTHER): Payer: Medicare Other

## 2021-06-21 ENCOUNTER — Telehealth: Payer: Self-pay | Admitting: Internal Medicine

## 2021-06-21 DIAGNOSIS — I255 Ischemic cardiomyopathy: Secondary | ICD-10-CM

## 2021-06-21 MED ORDER — CEPHALEXIN 250 MG PO CAPS: 250.0000 mg | ORAL_CAPSULE | Freq: Three times a day (TID) | ORAL | 0 refills | Status: AC

## 2021-06-21 NOTE — Telephone Encounter (Signed)
Spoke to [employer].   Reports of a "Blister" near the top of the ICD x1 week. No drainage to site, fever or chills. Redness noted per wife. Patient is not able to send photo, so will need to have patient to come into device clinic to check wound. Patient scheduled for device clinic apt today @ 3:00. Location, date and time discussed with patient.

## 2021-06-21 NOTE — Patient Instructions (Signed)
Please monitor wound and call device clinic with worsening or additional questions or concerns. [phone]  Take all of your antibiotic as prescribed.

## 2021-06-21 NOTE — Telephone Encounter (Signed)
  1. Has your device fired?  No   2. Is you device beeping?  No   3. Are you experiencing draining or swelling at device site?  No, but patient's wife states the patient has been experiencing pain around the lead of his ICD. She states this has been going on for the past few days.  4. Are you calling to see if we received your device transmission?  No   5. Have you passed out?  No

## 2021-06-21 NOTE — Progress Notes (Signed)
Patient brought into device clinic today c/o wound near device x 1 week. Denies drainage. Dr. Caryl Comes assessed. Concern for infection. Keflex 250mg  Q 8 hours x 5 days sent to patient's pharmacy of choice. Patient instructed to keep wound clean and dry and to discontinue wearing suspenders as this may be the cause of wound/irritation. Patient scheduled to return for nurse visit/wound check in 1 week, 06/26/21 at 10:40 am. Patient provided wound clinic contact and instructed to call with worsening symptoms or additional questions or concerns.

## 2021-06-26 ENCOUNTER — Ambulatory Visit (INDEPENDENT_AMBULATORY_CARE_PROVIDER_SITE_OTHER): Payer: Medicare Other

## 2021-06-26 ENCOUNTER — Other Ambulatory Visit: Payer: Self-pay

## 2021-06-26 DIAGNOSIS — I255 Ischemic cardiomyopathy: Secondary | ICD-10-CM

## 2021-06-26 NOTE — Progress Notes (Signed)
Patient returns to device clinic today for wound check s/p oral antibiotic therapy for infected skin lesion near pacer site. Wound much improved in appearance today. Dry and intact. Redness improved.  Last dose of antibiotic today. Will route to Dr. Caryl Comes for review

## 2021-06-26 NOTE — Patient Instructions (Signed)
Finish all antibiotics. Continue to keep suspenders from irritating area

## 2021-06-28 ENCOUNTER — Other Ambulatory Visit (HOSPITAL_COMMUNITY): Payer: Self-pay

## 2021-06-28 MED ORDER — FARXIGA 10 MG PO TABS: 10.0000 mg | ORAL_TABLET | Freq: Every morning | ORAL | 1 refills | Status: DC

## 2021-07-02 ENCOUNTER — Ambulatory Visit (INDEPENDENT_AMBULATORY_CARE_PROVIDER_SITE_OTHER): Payer: Medicare Other

## 2021-07-02 DIAGNOSIS — I255 Ischemic cardiomyopathy: Secondary | ICD-10-CM

## 2021-07-03 LAB — CUP PACEART REMOTE DEVICE CHECK
Battery Remaining Longevity: 61 mo
Battery Remaining Percentage: 82 %
Battery Voltage: 2.99 V
Brady Statistic AP VP Percent: 92 %
Brady Statistic AP VS Percent: 1 %
Brady Statistic AS VP Percent: 7.1 %
Brady Statistic AS VS Percent: 1 %
Brady Statistic RA Percent Paced: 91 %
Date Time Interrogation Session: 20220919020016
HighPow Impedance: 51 Ohm
HighPow Impedance: 51 Ohm
Implantable Lead Implant Date: 20060509
Implantable Lead Implant Date: 20060509
Implantable Lead Implant Date: 20090417
Implantable Lead Location: 753858
Implantable Lead Location: 753859
Implantable Lead Location: 753860
Implantable Lead Model: 5076
Implantable Lead Model: 7001
Implantable Pulse Generator Implant Date: 20211220
Lead Channel Impedance Value: 350 Ohm
Lead Channel Impedance Value: 480 Ohm
Lead Channel Impedance Value: 510 Ohm
Lead Channel Pacing Threshold Amplitude: 1 V
Lead Channel Pacing Threshold Amplitude: 1 V
Lead Channel Pacing Threshold Amplitude: 1.625 V
Lead Channel Pacing Threshold Pulse Width: 0.5 ms
Lead Channel Pacing Threshold Pulse Width: 0.8 ms
Lead Channel Pacing Threshold Pulse Width: 0.8 ms
Lead Channel Sensing Intrinsic Amplitude: 11.7 mV
Lead Channel Sensing Intrinsic Amplitude: 3.1 mV
Lead Channel Setting Pacing Amplitude: 2 V
Lead Channel Setting Pacing Amplitude: 2 V
Lead Channel Setting Pacing Amplitude: 2.625
Lead Channel Setting Pacing Pulse Width: 0.8 ms
Lead Channel Setting Pacing Pulse Width: 0.8 ms
Lead Channel Setting Sensing Sensitivity: 0.5 mV
Pulse Gen Serial Number: 9905796

## 2021-07-06 NOTE — Progress Notes (Signed)
Remote ICD transmission.   

## 2021-07-11 ENCOUNTER — Encounter: Payer: Self-pay | Admitting: Internal Medicine

## 2021-07-11 ENCOUNTER — Ambulatory Visit (INDEPENDENT_AMBULATORY_CARE_PROVIDER_SITE_OTHER): Payer: Medicare Other | Admitting: Internal Medicine

## 2021-07-11 ENCOUNTER — Other Ambulatory Visit: Payer: Self-pay

## 2021-07-11 VITALS — BP 90/58 | HR 100 | Ht 70.0 in | Wt 179.2 lb

## 2021-07-11 DIAGNOSIS — I4891 Unspecified atrial fibrillation: Secondary | ICD-10-CM | POA: Diagnosis not present

## 2021-07-11 DIAGNOSIS — I5022 Chronic systolic (congestive) heart failure: Secondary | ICD-10-CM

## 2021-07-11 DIAGNOSIS — Z95810 Presence of automatic (implantable) cardiac defibrillator: Secondary | ICD-10-CM | POA: Diagnosis not present

## 2021-07-11 DIAGNOSIS — I255 Ischemic cardiomyopathy: Secondary | ICD-10-CM

## 2021-07-11 NOTE — Progress Notes (Signed)
Symptoms since      Patient Care Team: [name], MD as PCP - General (Internal Medicine) Haroldine Laws, Shaune Pascal, MD as Consulting Physician (Cardiology)   HPI  [name] is a 87 y.o. male seen in followup for ischemic heart myopathy congestive heart failure and prior CRT-D with generator replaced 12/14; 12/21.     Recurrent  atrial fibrillation. Managed with amiodarone -- had problems with neurotoxicity with gait instability and his amiodarone  decreased>>and then stopped 2/2 tremor  Resumed 2020 for recurrent afib and had tolerated.  However, it was stopped at the heart failure clinic 5/22  no clinical afib ((See Below))   No chest pain or edema  The wound on Left chest by device is much improved    Date Cr K Hgb TSH LFT  4/17 2.5        6/19    1.22 103  2/20 2.49 3.6 13.1    11/20 2.2  12.6 1.39 10   6/21 2.49 4.5 12.8 0.757 (8/21)   12/21 2.52 4.1 13.7     Echo 12/14 LVEF 20-25% Carotid u/s 3/14: 40-59% R 0-39%%L Echo 11/2017: EF 99%  Thromboembolic risk factors ( age  -2, Vasc disease -1, CHF-1) for a CHADSVASc Score of >=4        Past Medical History:  Diagnosis Date   Acute bronchitis with COPD ([place]) 09/10/2016   Anemia    Atrial fibrillation or flutter    maintaining sinus on amiodarone     Bladder cancer ([place]) dx'd 05/2012   BPH (benign prostatic hyperplasia)    CAD (coronary artery disease)     a. s/p anterior MI 12/05 c/b shock -> stent LAD   b. s/p stenting OM-1, 2/06   CHF (congestive heart failure) ([place])    due to ischemic CM  a. EF 20-30%. (Nov 2008)   b. s/p St. Jude BiV-ICD    c. 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"   no neurologist   Skin cancer    basal cells   facial x 4, 1 right forearm   Sleep apnea    STOP BANG SCORE 4    Past Surgical History:  Procedure Laterality Date   BACK SURGERY  1972   lower back   BIV ICD GENERATOR CHANGEOUT N/A 10/02/2020   Procedure: BIV ICD GENERATOR CHANGEOUT;  Surgeon: Deboraha Sprang, MD;  Location: [place] [facility];  Service: Cardiovascular;  Laterality: N/A;   CARDIAC DEFIBRILLATOR PLACEMENT     ICD St Jude; gen change 09-20-13   CERVICAL LAMINECTOMY  1985   COLONOSCOPY     CORONARY ANGIOPLASTY  2005,2006   CYSTOSCOPY W/ RETROGRADES  05/25/2012   Procedure: CYSTOSCOPY WITH RETROGRADE PYELOGRAM;  Surgeon: Molli Hazard, MD;  [facility];  Service: Urology;  Laterality: Bilateral;   CYSTOSCOPY W/ URETERAL STENT  PLACEMENT  07/08/2012   Procedure: CYSTOSCOPY WITH STENT REPLACEMENT;  Surgeon: Molli Hazard, MD;  [facility];  Service: Urology;  Laterality: Left;   CYSTOSCOPY W/ URETERAL STENT REMOVAL  07/08/2012   Procedure: CYSTOSCOPY WITH STENT REMOVAL;  Surgeon: Molli Hazard, MD;  [facility];  Service: Urology;  Laterality: Bilateral;   CYSTOSCOPY/RETROGRADE/URETEROSCOPY  07/08/2012   Procedure: CYSTOSCOPY/RETROGRADE/URETEROSCOPY;  Surgeon: Molli Hazard, MD;  [facility];  Service: Urology;  Laterality: Left;   ESOPHAGOGASTRODUODENOSCOPY     HERNIA REPAIR  1989   bilateral   IMPLANTABLE CARDIOVERTER DEFIBRILLATOR (ICD) GENERATOR CHANGE N/A 09/20/2013   Procedure: ICD GENERATOR CHANGE;  Surgeon: Deboraha Sprang, MD;  Location: [facility] C[facility] LAB;  Service: Cardiovascular;  Laterality: N/A;   TEE WITHOUT CARDIOVERSION N/A 12/10/2017   Procedure: TRANSESOPHAGEAL  ECHOCARDIOGRAM (TEE);  Surgeon: Jerline Pain, MD;  Location[facility] END[facility]COPY;  Service: Cardiovascular;  Laterality: N/A;   TRANSURETHRAL RESECTION OF BLADDER TUMOR  05/25/2012   cold cup biopsy prostate   TRANSURETHRAL RESECTION OF BLADDER TUMOR  07/08/2012   Procedure: TRANSURETHRAL RESECTION OF BLADDER TUMOR (TURBT);  Surgeon: Molli Hazard, MD;  [facility];  Service: Urology;  Laterality: N/A;  CYSTO, TURBT W/ GYRUS, BILATERAL STENT REMOVAL, LEFT URETEROSCOPY WITH BIOPSY, POSSIBLE LEFT STENT PLACEMENT     Current Outpatient Medications  Medication Sig Dispense Refill   acetaminophen (TYLENOL) 500 MG tablet Take 500-1,000 mg by mouth every 6 (six) hours as needed (pain.).     carboxymethylcellulose (REFRESH PLUS) 0.5 % SOLN Place 1 drop into both eyes 3 (three) times daily as needed (dry/irritated eyes.).     carvedilol (COREG) 6.25 MG tablet TAKE 1 TABLET BY MOUTH 2 TIMES DAILY WITH A MEAL. 180 tablet 3   cephALEXin (KEFLEX) 500 MG capsule Take 4 capsules by mouth once. Take 45 minutes before dental procedure on tomorrow     ELIQUIS 2.5 MG TABS tablet TAKE 1 TABLET BY MOUTH TWICE A DAY 60 tablet 11   ezetimibe (ZETIA) 10 MG tablet TAKE ONE TABLET BY MOUTH ONCE DAILY WITH SUPPER (Patient taking differently: Take 10 mg by mouth every evening.) 90 tablet 3   FARXIGA 10 MG TABS tablet Take 1 tablet (10 mg total) by mouth every morning. 90 tablet 1   ferrous sulfate 325 (65 FE) MG tablet Take 325 mg by mouth daily with supper.     furosemide (LASIX) 20 MG tablet Take 2 tablets (40 mg total) by mouth daily. 180 tablet 2   Multiple Vitamin (MULTIVITAMIN WITH MINERALS) TABS tablet Take 1 tablet by mouth every evening. Centrum Silver     nitroGLYCERIN (NITROSTAT) 0.4 MG SL tablet DISSOLVE ONE TABLET UNDER THE TONGUE EVERY 5 MINUTES AS NEEDED FOR CHEST PAIN.  DO NOT EXCEED A TOTAL OF 3 DOSES IN 15 MINUTES (Patient taking differently: Place 0.4 mg under the tongue every 5 (five) minutes x 3  doses as needed for chest pain.) 25 tablet 0   pantoprazole (PROTONIX) 40 MG tablet Take 40 mg by mouth daily before breakfast.     potassium chloride (K-DUR,KLOR-CON) 10 MEQ tablet Take 1 tablet (10 mEq total) by mouth daily. 30 tablet 0   rosuvastatin (CRESTOR) 20 MG tablet Take 1 tablet (20 mg total) by mouth daily. (Patient taking differently: Take 20 mg by mouth every evening.) 90 tablet 3   sodium chloride (OCEAN) 0.65 % SOLN nasal spray Place 1 spray into both nostrils as needed for congestion.  SYNTHROID 75 MCG tablet TAKE 1 TABLET BY MOUTH DAILY BEFORE BREAKFAST. 90 tablet 0   tamsulosin (FLOMAX) 0.4 MG CAPS capsule Take 0.4 mg by mouth at bedtime.      triamcinolone (NASACORT) 55 MCG/ACT AERO nasal inhaler USE 2 SPRAYS IN EACH NOSTRIL ONCE DAILY AT NIGHT 16.9 each 12   No current facility-administered medications for this visit.    Allergies  Allergen Reactions   Ramipril Cough   Penicillins Itching and Rash    Has patient had a PCN reaction causing immediate rash, facial/tongue/throat swelling, SOB or lightheadedness with hypotension: No Has patient had a PCN reaction causing severe rash involving mucus membranes or skin necrosis: Yes-back and hands. Has patient had a PCN reaction that required hospitalization: No Has patient had a PCN reaction occurring within the last 10 years: Yes If all of the above answers are "NO", then may proceed with Cephalosporin use.    Review of Systems negative except from HPI and PMH  Physical Exam BP (!) 90/58   Pulse 100   Ht 5\' 10"  (1.778 m)   Wt 179 lb 3.2 oz (81.3 kg)   SpO2 96%   BMI 25.71 kg/m  Well developed and well nourished in no acute distress HENT normal Neck supple  Clear Device pocket well healed; without hematoma or erythema.  There is no tethering  Regular rate and rhythm, no murmur Abd-soft with active BS No Clubbing cyanosis  edema Skin-warm and dry A & Oriented  Grossly normal sensory and motor function  ECG  afib @ 100 -/13/40 Occ V pacing  Assessment and  Plan  Ischemic cardiomyopathy  CRT-d  St Jude status post generator replacement 12/21  Atrial fibrillation .-Recurrent  Heart failure-chronic systolic  Renal insufficiency grade 3-4    Intercurrent atrial fibrillation following the discontinuation of his amiodarone.  I have discussed with the heart failure team it may be worth resuming if the burden continues to increase and its associated with symptoms.  No clinical bleeding.  We will continue him on Eliquis at 2.5 mg a day dosed based on his creatinine and his age  No ischemia.  Continue him on apixaban carvedilol 6.25 twice daily and his rosuvastatin 20  Euvolemic.  Continue his Lasix at 40 mg daily with potassium repletion.

## 2021-07-11 NOTE — Patient Instructions (Signed)

## 2021-07-16 ENCOUNTER — Encounter: Payer: Self-pay | Admitting: Podiatry

## 2021-07-16 ENCOUNTER — Other Ambulatory Visit: Payer: Self-pay

## 2021-07-16 ENCOUNTER — Ambulatory Visit (INDEPENDENT_AMBULATORY_CARE_PROVIDER_SITE_OTHER): Payer: Medicare Other | Admitting: Podiatry

## 2021-07-16 DIAGNOSIS — N184 Chronic kidney disease, stage 4 (severe): Secondary | ICD-10-CM | POA: Diagnosis not present

## 2021-07-16 DIAGNOSIS — M79674 Pain in right toe(s): Secondary | ICD-10-CM

## 2021-07-16 DIAGNOSIS — M25519 Pain in unspecified shoulder: Secondary | ICD-10-CM | POA: Insufficient documentation

## 2021-07-16 DIAGNOSIS — M79675 Pain in left toe(s): Secondary | ICD-10-CM

## 2021-07-16 DIAGNOSIS — G629 Polyneuropathy, unspecified: Secondary | ICD-10-CM

## 2021-07-16 DIAGNOSIS — L84 Corns and callosities: Secondary | ICD-10-CM | POA: Diagnosis not present

## 2021-07-16 DIAGNOSIS — B351 Tinea unguium: Secondary | ICD-10-CM

## 2021-07-16 DIAGNOSIS — Z9622 Myringotomy tube(s) status: Secondary | ICD-10-CM | POA: Insufficient documentation

## 2021-07-22 NOTE — Progress Notes (Signed)
  Subjective:  Patient ID: [name], male    DOB: [birth year],  MRN: [id number]  [name] presents to clinic today for thick, elongated toenails b/l feet which are tender when wearing enclosed shoe gear. He has h/o CKD, stage 4 and neuropathy. He was diagnosed with peripheral neuropathy by Dr. Carles Collet in Neurology in 2016.   Patient is on Eliquis. His wife is present during today's visit. They voice no new pedal issues today.   PCP is [name], MD , and last visit was 03/05/2021.  Allergies  Allergen Reactions   Gabapentin     Other reaction(s): itching   Ramipril Cough   Penicillins Itching and Rash    Has patient had a PCN reaction causing immediate rash, facial/tongue/throat swelling, SOB or lightheadedness with hypotension: No Has patient had a PCN reaction causing severe rash involving mucus membranes or skin necrosis: Yes-back and hands. Has patient had a PCN reaction that required hospitalization: No Has patient had a PCN reaction occurring within the last 10 years: Yes If all of the above answers are "NO", then may proceed with Cephalosporin use.    Review of Systems: Negative except as noted in the HPI. Objective:   Constitutional [name] is a pleasant 87 y.o. Caucasian male, WD, WN in NAD. AAO x 3.   Vascular Capillary fill time to digits <3 seconds b/l lower extremities. Faintly palpable DP pulse(s) b/l lower extremities. Faintly palpable PT pulse(s) b/l lower extremities. Pedal hair sparse. Lower extremity skin temperature gradient within normal limits. No edema noted b/l lower extremities. No cyanosis or clubbing noted.  Neurologic Normal speech. Oriented to person, place, and time. Pt has subjective symptoms of neuropathy. Protective sensation intact 5/5 intact bilaterally with 10g monofilament b/l. Vibratory sensation diminished b/l.  Dermatologic Pedal skin is thin shiny, atrophic b/l lower extremities. No open wounds b/l lower extremities. No  interdigital macerations b/l lower extremities. Toenails 1-5 b/l elongated, discolored, dystrophic, thickened, crumbly with subungual debris and tenderness to dorsal palpation. Hyperkeratotic lesion(s) R hallux, submet head 1 left foot, submet head 1 right foot, and plantar aspect of heel right foot.  No erythema, no edema, no drainage, no fluctuance.  Orthopedic: Normal muscle strength 5/5 to all lower extremity muscle groups bilaterally. Hallux valgus with bunion deformity noted b/l lower extremities.   Radiographs: None Assessment:   1. Pain due to onychomycosis of toenails of both feet   2. Callus   3. CKD (chronic kidney disease) stage 4, GFR 15-29 ml/min (HCC)   4. Peripheral polyneuropathy    Plan:  Patient was evaluated and treated and all questions answered. Consent given for treatment as described below: -Examined patient. -Patient to continue soft, supportive shoe gear daily. -Toenails 1-5 b/l were debrided in length and girth with sterile nail nippers and dremel without iatrogenic bleeding.  -Callus(es) R hallux, submet head 1 left foot, submet head 1 right foot, and plantar aspect of heel right foot pared utilizing sterile scalpel blade without complication or incident. Total number debrided =4. -Patient to report any pedal injuries to medical professional immediately. -Patient/POA to call should there be question/concern in the interim.  Return in about 3 months (around 10/16/2021).  Marzetta Board, DPM

## 2021-07-31 ENCOUNTER — Encounter (HOSPITAL_COMMUNITY): Payer: Self-pay | Admitting: Internal Medicine

## 2021-07-31 ENCOUNTER — Other Ambulatory Visit: Payer: Self-pay

## 2021-07-31 ENCOUNTER — Ambulatory Visit (HOSPITAL_COMMUNITY)
Admission: RE | Admit: 2021-07-31 | Discharge: 2021-07-31 | Disposition: A | Discharge status: Home / Self Care | Payer: Medicare Other | Source: Ambulatory Visit | Attending: Internal Medicine | Admitting: Internal Medicine

## 2021-07-31 VITALS — BP 102/64 | HR 96 | Wt 180.2 lb

## 2021-07-31 DIAGNOSIS — Z7984 Long term (current) use of oral hypoglycemic drugs: Secondary | ICD-10-CM | POA: Insufficient documentation

## 2021-07-31 DIAGNOSIS — Z7901 Long term (current) use of anticoagulants: Secondary | ICD-10-CM | POA: Diagnosis not present

## 2021-07-31 DIAGNOSIS — Z79890 Hormone replacement therapy (postmenopausal): Secondary | ICD-10-CM | POA: Diagnosis not present

## 2021-07-31 DIAGNOSIS — I480 Paroxysmal atrial fibrillation: Secondary | ICD-10-CM | POA: Diagnosis not present

## 2021-07-31 DIAGNOSIS — I130 Hypertensive heart and chronic kidney disease with heart failure and stage 1 through stage 4 chronic kidney disease, or unspecified chronic kidney disease: Secondary | ICD-10-CM | POA: Insufficient documentation

## 2021-07-31 DIAGNOSIS — M25422 Effusion, left elbow: Secondary | ICD-10-CM | POA: Insufficient documentation

## 2021-07-31 DIAGNOSIS — Z79899 Other long term (current) drug therapy: Secondary | ICD-10-CM | POA: Insufficient documentation

## 2021-07-31 DIAGNOSIS — I2510 Atherosclerotic heart disease of native coronary artery without angina pectoris: Secondary | ICD-10-CM | POA: Insufficient documentation

## 2021-07-31 DIAGNOSIS — E785 Hyperlipidemia, unspecified: Secondary | ICD-10-CM | POA: Diagnosis not present

## 2021-07-31 DIAGNOSIS — N184 Chronic kidney disease, stage 4 (severe): Secondary | ICD-10-CM | POA: Insufficient documentation

## 2021-07-31 DIAGNOSIS — K922 Gastrointestinal hemorrhage, unspecified: Secondary | ICD-10-CM | POA: Insufficient documentation

## 2021-07-31 DIAGNOSIS — I5022 Chronic systolic (congestive) heart failure: Secondary | ICD-10-CM | POA: Insufficient documentation

## 2021-07-31 DIAGNOSIS — I252 Old myocardial infarction: Secondary | ICD-10-CM | POA: Diagnosis not present

## 2021-07-31 DIAGNOSIS — Z8551 Personal history of malignant neoplasm of bladder: Secondary | ICD-10-CM | POA: Diagnosis not present

## 2021-07-31 LAB — BASIC METABOLIC PANEL
Anion gap: 10 (ref 5–15)
BUN: 26 mg/dL — ABNORMAL HIGH (ref 8–23)
CO2: 23 mmol/L (ref 22–32)
Calcium: 8.8 mg/dL — ABNORMAL LOW (ref 8.9–10.3)
Chloride: 106 mmol/L (ref 98–111)
Creatinine, Ser: 2.26 mg/dL — ABNORMAL HIGH (ref 0.61–1.24)
GFR, Estimated: 27 mL/min — ABNORMAL LOW (ref >60)
Glucose, Bld: 108 mg/dL — ABNORMAL HIGH (ref 70–99)
Potassium: 4.1 mmol/L (ref 3.5–5.1)
Sodium: 139 mmol/L (ref 135–145)

## 2021-07-31 LAB — CBC
HCT: 47.4 % (ref 39.0–52.0)
Hemoglobin: 14.3 g/dL (ref 13.0–17.0)
MCH: 30.5 pg (ref 26.0–34.0)
MCHC: 30.2 g/dL (ref 30.0–36.0)
MCV: 101.1 fL — ABNORMAL HIGH (ref 80.0–100.0)
Platelets: 216 10*3/uL (ref 150–400)
RBC: 4.69 MIL/uL (ref 4.22–5.81)
RDW: 13.8 % (ref 11.5–15.5)
WBC: 5.4 10*3/uL (ref 4.0–10.5)
nRBC: 0 % (ref 0.0–0.2)

## 2021-07-31 LAB — URIC ACID: Uric Acid, Serum: 7.4 mg/dL (ref 3.7–8.6)

## 2021-07-31 LAB — BRAIN NATRIURETIC PEPTIDE: B Natriuretic Peptide: 300.1 pg/mL — ABNORMAL HIGH (ref 0.0–100.0)

## 2021-07-31 NOTE — Patient Instructions (Signed)
EKG done today.  Labs done today. We will contact you only if your labs are abnormal.  No medication changes were made. Please continue all current medications as prescribed.  Your physician recommends that you schedule a follow-up appointment in: 9 months. Please contact our office in June 2023 to schedule a July 2023 appointment.   If you have any questions or concerns before your next appointment please send [place] a message through [place] or call our office at [phone].    TO LEAVE A MESSAGE FOR THE NURSE SELECT OPTION 2, PLEASE LEAVE A MESSAGE INCLUDING: YOUR NAME DATE OF BIRTH CALL BACK NUMBER REASON FOR CALL**this is important as we prioritize the call backs  YOU WILL RECEIVE A CALL BACK THE SAME DAY AS LONG AS YOU CALL BEFORE 4:00 PM   Do the following things EVERYDAY: Weigh yourself in the morning before breakfast. Write it down and keep it in a log. Take your medicines as prescribed Eat low salt foods--Limit salt (sodium) to 2000 mg per day.  Stay as active as you can everyday Limit all fluids for the day to less than 2 liters   At the [facility], you and your health needs are our priority. As part of our continuing mission to provide you with exceptional heart care, we have created designated Provider Care Teams. These Care Teams include your primary Cardiologist (physician) and Advanced Practice Providers (APPs- Physician Assistants and Nurse Practitioners) who all work together to provide you with the care you need, when you need it.   You may see any of the following providers on your designated Care Team at your next follow up: Dr Glori Bickers Dr Haynes Kerns, NP Lyda Jester, [place] Audry Riles, PharmD   Please be sure to bring in all your medications bottles to every appointment.

## 2021-07-31 NOTE — Progress Notes (Signed)
Patient ID: [name], male   DOB: [birth year], 87 y.o.   MRN: [id number]    Advanced Heart Failure Clinic Note   PCP: Dr. Virgina Jock  HPI: [name] is a 87 y.o. male with a history of coronary artery disease, status post previous large anterior wall myocardial infarction s/p multivessel stenting, systolic CHF with EF 60% s/p [employer] Jude BiV ICD.  Remainder of his medical history is notable for atrial fibrillation,maintaining sinus rhythm on amiodarone.  He is not on Coumadin secondary to GI bleed, chronic renal insufficiency with baseline creatinine about 2.2-2.5, hypertension, hyperlipidemia, and a systemic tremor. Bladder cancer 05/2012. Completed BCG treatment.   2015 found that his left ventricular lead has not been capturing suggestive of either fracture of the proximal electrode or an issue at the header. They reprogrammed the device to use to the RV coil which he tolerated well and had a good threshold.   He was admitted in 11/17 for respiratory virus.  Admitted 12/30 - 10/15/16 with A/C CHF in setting of URI. Thought to have mild CHF, so given IV diuresis in house with rapid improvement.   Admitted 2/19 with enterococcus bacteremia thought to be UTI. Treated with daptomycin. TEE EF 20-25% no vegetation mod MR  He presents today for regular follow up. Says he feels good. Denies CP. Mild DOE. (no change). No orthopnea or PND.    ICD:  Personally reviewed. Volume level mildly elevated. No VT. AF burden 9.2% 96% biv pacing Personally reviewed   Echo 12/14 LVEF 20-25% Carotid u/s 3/14: 40-59% R 0-39%%L Echo 11/2017: EF 25%   Review of systems complete and found to be negative unless listed in HPI.    Past Medical History:  Diagnosis Date   Acute bronchitis with COPD ([place]) 09/10/2016   Anemia    Atrial fibrillation or flutter    maintaining sinus on amiodarone     Bladder cancer ([place]) dx'd 05/2012   BPH (benign prostatic hyperplasia)    CAD (coronary artery disease)      a. s/p anterior MI 12/05 c/b shock -> stent LAD   b. s/p stenting OM-1, 2/06   CHF (congestive heart failure) ([place])    due to ischemic CM  a. EF 20-30%. (Nov 2008)   b. s/p [employer] Jude BiV-ICD    c. 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/16/2013   Major depression, single episode, in complete remission ([place]) 08/16/2016   Neuromuscular disorder ([place])    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"   no neurologist   Osteoarthritis 03/08/2011   Skin cancer    basal cells   facial x 4, 1 right forearm   Sleep apnea    STOP BANG SCORE 4    Current Outpatient Medications  Medication Sig Dispense Refill   acetaminophen (TYLENOL) 500 MG tablet Take 500-1,000 mg by mouth every 6 (six) hours as needed (pain.).     apixaban (ELIQUIS) 2.5 MG TABS  tablet Take by mouth 2 (two) times daily.     carboxymethylcellulose (REFRESH PLUS) 0.5 % SOLN Place 1 drop into both eyes 3 (three) times daily as needed (dry/irritated eyes.).     carvedilol (COREG) 6.25 MG tablet TAKE 1 TABLET BY MOUTH 2 TIMES DAILY WITH A MEAL. 180 tablet 3   ezetimibe (ZETIA) 10 MG tablet Take 1 tablet by mouth daily with supper.     FARXIGA 10 MG TABS tablet Take 1 tablet (10 mg total) by mouth every morning. 90 tablet 1   ferrous sulfate 325 (65 FE) MG tablet Take 325 mg by mouth daily with supper.     furosemide (LASIX) 20 MG tablet Take 2 tablets by mouth daily.     hydrOXYzine (ATARAX/VISTARIL) 25 MG tablet Take 1 tablet by mouth at bedtime as needed.      levothyroxine (SYNTHROID) 75 MCG tablet Take 75 mcg by mouth daily before breakfast.     Magnesium Hydroxide (MILK OF MAGNESIA PO) 31mls po prn constipation     Multiple Vitamin (MULTIVITAMIN WITH MINERALS) TABS tablet Take 1 tablet by mouth every evening. Centrum Silver     Multiple Vitamin (MV-ONE) CAPS daily.     nitroGLYCERIN (NITROSTAT) 0.4 MG SL tablet DISSOLVE ONE TABLET UNDER THE TONGUE EVERY 5 MINUTES AS NEEDED FOR CHEST PAIN.  DO NOT EXCEED A TOTAL OF 3 DOSES IN 15 MINUTES 25 tablet 0   ondansetron (ZOFRAN) 4 MG tablet Take 1 tablet po 3x a day prn for nausea and vomitting     pantoprazole (PROTONIX) 40 MG tablet Take 40 mg by mouth daily before breakfast.     pantoprazole (PROTONIX) 40 MG tablet Take 1 tablet by mouth daily.     potassium chloride (KLOR-CON) 10 MEQ tablet Take 1 tablet by mouth daily.     rosuvastatin (CRESTOR) 20 MG tablet Take 1 tablet by mouth daily.     sodium chloride (OCEAN) 0.65 % SOLN nasal spray Place 1 spray into both nostrils as needed for congestion.     SYNTHROID 75 MCG tablet TAKE 1 TABLET BY MOUTH DAILY BEFORE BREAKFAST. 90 tablet 0   tamsulosin (FLOMAX) 0.4 MG CAPS capsule Take 0.4 mg by mouth at bedtime.      triamcinolone (NASACORT) 55 MCG/ACT AERO nasal inhaler USE 2 SPRAYS IN EACH NOSTRIL ONCE DAILY AT NIGHT 16.9 each 12   cephALEXin (KEFLEX) 500 MG capsule Take 4 capsules by mouth once. Take 45 minutes before dental procedure on tomorrow (Patient not taking: Reported on 07/31/2021)     No current facility-administered medications for this encounter.   PHYSICAL EXAM: Vitals:   07/31/21 1222  BP: 102/64  Pulse: 96  SpO2: 95%  Weight: 81.7 kg (180 lb 3.2 oz)     Wt Readings from Last 3 Encounters:  07/31/21 81.7 kg (180 lb 3.2 oz)  07/11/21 81.3 kg (179 lb 3.2 oz)  02/15/21 83.8 kg (184 lb 12.8 oz)    General:  Elderly Well appearing. No resp difficulty HEENT: normal Neck: supple. no JVD. Carotids 2+ bilat; no bruits. No lymphadenopathy  or thryomegaly appreciated. Cor: PMI nondisplaced. Regular rate & rhythm. No rubs, gallops or murmurs. Lungs: clear Abdomen: soft, nontender, nondistended. No hepatosplenomegaly. No bruits or masses. Good bowel sounds. Extremities: no cyanosis, clubbing, rash, edema Probable large olecranon bursa on left elbow Neuro: alert & orientedx3, cranial nerves grossly intact. moves all 4 extremities w/o difficulty. Affect pleasant  ECG: AV paced Personally reviewed   ASSESSMENT & PLAN:  1.  Chronic systolic CHF. Echo 10/13/2017 LVEF 20-25% (Stable from last year). Echo 2/19 EF 20-25% - Stable chronic NYHA II-III symptoms.  - Volume status ok - Continue lasix 40 mg daily.  - BP too low to add valsartan or MRA back especially with CKD IV. - Continue coreg 6.25 mg BID.  - Continue Farxiga 10  2. CAD - No s/s of ischemia  - Off ASA due to Eliquis.  - Continue statin. Lipids followed by Dr. Virgina Jock. Goal LDL < 70  3. PAF  - Remains in NSR on ECG today - Now off amiodarone.  - Denies bleeding. Continue Eliquis 2.5 bid   4. Carotid ultrasound  - 1-39% bilaterally on last scan 3/15. No repeat warranted.   no change  5. CKD, Stage IV  - Creatinine stable at 2.26 today - Follows with Dr. Joelyn Oms - Continue Farxiga  6. L elbow swelling - suspect olecranon bursitis (vs gouty nodule) - uric acid ok  - he will f/u with Ortho (Dr. French Ana)  Glori Bickers, MD  12:50 PM

## 2021-08-09 DIAGNOSIS — Z23 Encounter for immunization: Secondary | ICD-10-CM | POA: Diagnosis not present

## 2021-08-29 ENCOUNTER — Other Ambulatory Visit (HOSPITAL_COMMUNITY): Payer: Self-pay | Admitting: Internal Medicine

## 2021-09-03 ENCOUNTER — Other Ambulatory Visit: Payer: Self-pay

## 2021-09-03 ENCOUNTER — Emergency Department (HOSPITAL_COMMUNITY)
Admission: EM | Admit: 2021-09-03 | Discharge: 2021-09-03 | Disposition: A | Discharge status: Home / Self Care | Payer: Medicare Other | Attending: Emergency Medicine | Admitting: Emergency Medicine

## 2021-09-03 ENCOUNTER — Emergency Department (HOSPITAL_COMMUNITY): Payer: Medicare Other

## 2021-09-03 DIAGNOSIS — I2510 Atherosclerotic heart disease of native coronary artery without angina pectoris: Secondary | ICD-10-CM | POA: Diagnosis not present

## 2021-09-03 DIAGNOSIS — Z8551 Personal history of malignant neoplasm of bladder: Secondary | ICD-10-CM | POA: Insufficient documentation

## 2021-09-03 DIAGNOSIS — Z85828 Personal history of other malignant neoplasm of skin: Secondary | ICD-10-CM | POA: Insufficient documentation

## 2021-09-03 DIAGNOSIS — K5730 Diverticulosis of large intestine without perforation or abscess without bleeding: Secondary | ICD-10-CM | POA: Diagnosis not present

## 2021-09-03 DIAGNOSIS — J449 Chronic obstructive pulmonary disease, unspecified: Secondary | ICD-10-CM | POA: Diagnosis not present

## 2021-09-03 DIAGNOSIS — Z79899 Other long term (current) drug therapy: Secondary | ICD-10-CM | POA: Diagnosis not present

## 2021-09-03 DIAGNOSIS — I130 Hypertensive heart and chronic kidney disease with heart failure and stage 1 through stage 4 chronic kidney disease, or unspecified chronic kidney disease: Secondary | ICD-10-CM | POA: Insufficient documentation

## 2021-09-03 DIAGNOSIS — N261 Atrophy of kidney (terminal): Secondary | ICD-10-CM | POA: Diagnosis not present

## 2021-09-03 DIAGNOSIS — I5022 Chronic systolic (congestive) heart failure: Secondary | ICD-10-CM | POA: Diagnosis not present

## 2021-09-03 DIAGNOSIS — N184 Chronic kidney disease, stage 4 (severe): Secondary | ICD-10-CM | POA: Diagnosis not present

## 2021-09-03 DIAGNOSIS — M545 Low back pain, unspecified: Secondary | ICD-10-CM | POA: Diagnosis not present

## 2021-09-03 DIAGNOSIS — Z7901 Long term (current) use of anticoagulants: Secondary | ICD-10-CM | POA: Insufficient documentation

## 2021-09-03 DIAGNOSIS — K449 Diaphragmatic hernia without obstruction or gangrene: Secondary | ICD-10-CM | POA: Diagnosis not present

## 2021-09-03 DIAGNOSIS — M546 Pain in thoracic spine: Secondary | ICD-10-CM | POA: Insufficient documentation

## 2021-09-03 DIAGNOSIS — N323 Diverticulum of bladder: Secondary | ICD-10-CM | POA: Diagnosis not present

## 2021-09-03 DIAGNOSIS — Z87891 Personal history of nicotine dependence: Secondary | ICD-10-CM | POA: Insufficient documentation

## 2021-09-03 LAB — COMPREHENSIVE METABOLIC PANEL
ALT: 9 U/L (ref 0–44)
AST: 21 U/L (ref 15–41)
Albumin: 3.7 g/dL (ref 3.5–5.0)
Alkaline Phosphatase: 83 U/L (ref 38–126)
Anion gap: 13 (ref 5–15)
BUN: 30 mg/dL — ABNORMAL HIGH (ref 8–23)
CO2: 26 mmol/L (ref 22–32)
Calcium: 9.3 mg/dL (ref 8.9–10.3)
Chloride: 101 mmol/L (ref 98–111)
Creatinine, Ser: 2.79 mg/dL — ABNORMAL HIGH (ref 0.61–1.24)
GFR, Estimated: 21 mL/min — ABNORMAL LOW (ref >60)
Glucose, Bld: 119 mg/dL — ABNORMAL HIGH (ref 70–99)
Potassium: 4.1 mmol/L (ref 3.5–5.1)
Sodium: 140 mmol/L (ref 135–145)
Total Bilirubin: 1.3 mg/dL — ABNORMAL HIGH (ref 0.3–1.2)
Total Protein: 7.7 g/dL (ref 6.5–8.1)

## 2021-09-03 LAB — CBC WITH DIFFERENTIAL/PLATELET
Abs Immature Granulocytes: 0.02 10*3/uL (ref 0.00–0.07)
Basophils Absolute: 0.1 10*3/uL (ref 0.0–0.1)
Basophils Relative: 1 %
Eosinophils Absolute: 0.4 10*3/uL (ref 0.0–0.5)
Eosinophils Relative: 4 %
HCT: 48.1 % (ref 39.0–52.0)
Hemoglobin: 15 g/dL (ref 13.0–17.0)
Immature Granulocytes: 0 %
Lymphocytes Relative: 22 %
Lymphs Abs: 2 10*3/uL (ref 0.7–4.0)
MCH: 29.1 pg (ref 26.0–34.0)
MCHC: 31.2 g/dL (ref 30.0–36.0)
MCV: 93.2 fL (ref 80.0–100.0)
Monocytes Absolute: 0.8 10*3/uL (ref 0.1–1.0)
Monocytes Relative: 9 %
Neutro Abs: 5.8 10*3/uL (ref 1.7–7.7)
Neutrophils Relative %: 64 %
Platelets: 247 10*3/uL (ref 150–400)
RBC: 5.16 MIL/uL (ref 4.22–5.81)
RDW: 13.2 % (ref 11.5–15.5)
WBC: 9.1 10*3/uL (ref 4.0–10.5)
nRBC: 0 % (ref 0.0–0.2)

## 2021-09-03 LAB — URINALYSIS, ROUTINE W REFLEX MICROSCOPIC
Bacteria, UA: NONE SEEN
Bilirubin Urine: NEGATIVE
Glucose, UA: 150 mg/dL — AB
Ketones, ur: NEGATIVE mg/dL
Leukocytes,Ua: NEGATIVE
Nitrite: NEGATIVE
Protein, ur: NEGATIVE mg/dL
Specific Gravity, Urine: 1.012 (ref 1.005–1.030)
pH: 5 (ref 5.0–8.0)

## 2021-09-03 LAB — LIPASE, BLOOD: Lipase: 42 U/L (ref 11–51)

## 2021-09-03 MED ORDER — ACETAMINOPHEN 500 MG PO TABS
1000.0000 mg | ORAL_TABLET | Freq: Once | ORAL | Status: AC
  Administered 2021-09-03: 1000 mg via ORAL
  Filled 2021-09-03: qty 2

## 2021-09-03 MED ORDER — DIAZEPAM 2 MG PO TABS
2.0000 mg | ORAL_TABLET | Freq: Once | ORAL | Status: AC
  Administered 2021-09-03: 2 mg via ORAL
  Filled 2021-09-03: qty 1

## 2021-09-03 MED ORDER — DIAZEPAM 2 MG PO TABS: 2.0000 mg | ORAL_TABLET | Freq: Three times a day (TID) | ORAL | 0 refills | Status: DC | PRN

## 2021-09-03 NOTE — Discharge Instructions (Addendum)
The CAT scans today looked good.  Your kidney function was slightly higher than normal at 2.7 and this will need to be followed up with your kidney doctor for repeat checks.  You can try using Voltaren or diclofenac gel for the pain.  Just rub it where it hurts.  You are also given a prescription for Valium that you can use for muscle spasm.  You can continue to use a heating pad and also extra strength Tylenol.  If you start having inability to walk, weakness in your leg, loss of control of your bowel movements you should return to the emergency room.

## 2021-09-03 NOTE — ED Triage Notes (Signed)
Pt here from PCP with complaints of right flank pain. Concerns for kidney stones. Pt endorses decrease ability to urinate.

## 2021-09-03 NOTE — ED Provider Notes (Signed)
[facility] EMERGENCY DEPARTMENT Provider Note   CSN: [id number] Arrival date & time: 09/03/21  1431     History No chief complaint on file.   [name] is a 87 y.o. male.  Patient is an 87 year old male with a history of CAD, CHF, chronic renal insufficiency, hypertension, hyperlipidemia, atrial flutter, prior bladder tumor status postresection and back surgery years ago who is presenting today with complaints of back pain that has now been present for 2 weeks.  He reported it just started 1 day during the day and seem to be on the right side but it has moved to the upper part of the right side of his back and then occasionally moves around to the side.  Sometimes it moves to the left side but has just been persistent and is not improving.  He has not had any pain radiate down the legs or had any weakness when he is tried to walk.  He reports that sometimes when he gets up and walks with his walker the pain actually gets better.  He had spoken with his doctor who gave him cyclobenzaprine but reported he tried taking it twice and it actually made him feel worse.  He has been taking Tylenol with minimal effect as well is using icy hot.  Lidocaine patches were not helpful.  Occasionally he has difficulty getting his urine started and then will sometimes have pain in the stream will get weak.  However this has been an intermittent issue for some time and not specifically related to the back pain.  He has had no nausea or vomiting but does occasionally feel bloated.  No change in bowel movements.  He went to see [facility] today because of his back pain and after an examination they sent him here because they were concerned for possible appendicitis, kidney stone or other abdominal pathology.  He did have x-rays done in the office that showed atherosclerotic disease.  Patient has had several falls the most recent was about a month ago but reports he did not think he really hurt  anything.  The history is provided by the patient and the spouse.      Past Medical History:  Diagnosis Date   Acute bronchitis with COPD ([place]) 09/10/2016   Anemia    Atrial fibrillation or flutter    maintaining sinus on amiodarone     Bladder cancer ([place]) dx'd 05/2012   BPH (benign prostatic hyperplasia)    CAD (coronary artery disease)     a. s/p anterior MI 12/05 c/b shock -> stent LAD   b. s/p stenting OM-1, 2/06   CHF (congestive heart failure) ([place])    due to ischemic CM  a. EF 20-30%. (Nov 2008)   b. s/p St. Jude BiV-ICD    c. 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/16/2013   Major depression, single episode, in complete remission ([place]) 08/16/2016   Neuromuscular disorder ([place])  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"   no neurologist   Osteoarthritis 03/08/2011   Skin cancer    basal cells   facial x 4, 1 right forearm   Sleep apnea    STOP BANG SCORE 4    Patient Active Problem List   Diagnosis Date Noted   Retained myringotomy tube in left ear 07/16/2021   Shoulder joint pain 07/16/2021   Allergic rhinitis 03/06/2020   Neck pain 03/06/2020   Visual disturbance 08/30/2019   Ingrown nail 01/11/2019   Encounter for screening for other disorder 08/27/2018   Other specified abnormal findings of blood chemistry 08/27/2018   Cough 06/11/2018   Presence of cardiac and vascular implant and graft, unspecified 12/22/2017   Sepsis due to Enterococcus ([place]) 12/22/2017   Ventricular tachycardia  12/22/2017   Enterococcal bacteremia 12/08/2017   Nausea & vomiting 12/06/2017   Renal insufficiency 12/06/2017   Low back pain 08/21/2017   Abnormal weight loss 07/29/2017   Abnormal feces 12/16/2016   Acute respiratory failure with hypoxia ([place]) 09/10/2016   Acute bronchitis with COPD ([place]) 09/10/2016   Major depression, single episode, in complete remission ([place]) 08/16/2016   Heart failure ([place]) 08/01/2015   Hypertensive heart and chronic kidney disease with heart failure and stage 1 through stage 4 chronic kidney disease, or unspecified chronic kidney disease ([place]) 08/01/2015   Non-thrombocytopenic purpura ([place]) 08/01/2015   CKD (chronic kidney disease) stage 4, GFR 15-29 ml/min (HCC) 04/04/2015   Cardiac pacemaker in situ 03/24/2014   Fatigue 03/24/2014   Left ventricular lead failure to capture on the ring electrode 12/16/2013   Adult failure to thrive syndrome 08/20/2013   Thrombocytopenia ([place]) 08/20/2013   Urosepsis 08/16/2013   Anemia 05/21/2013   Basal cell carcinoma of skin 05/21/2013   Hyperglycemia 05/21/2013   Incontinence of feces 05/21/2013   Old myocardial infarction 05/21/2013   Tremor 05/21/2013   Bruising 08/12/2012   Bladder cancer ([place]) 07/08/2012   UTI (urinary tract infection) 05/27/2012   Bladder tumor 05/18/2012   Knee pain, acute, right 02/11/2012   Osteoarthritis 03/08/2011   CAROTID ARTERY DISEASE 12/06/2010   Peripheral vascular disease ([place]) 09/04/2010   Essential hypertension, benign 04/06/7627   Chronic systolic CHF (congestive heart failure) (HCC)-EF 25% 09/14/2009   CHEST PAIN 08/08/2009   Hypothyroidism 06/26/2009   Mixed hyperlipidemia 05/25/2009   Gastro-esophageal reflux disease without esophagitis 04/24/2009   Insomnia 04/24/2009   Polyneuropathy 04/24/2009   CAD, NATIVE VESSEL 02/01/2009   Ischemic cardiomyopathy 01/24/2009   Atrial fibrillation ([place]) 01/21/2009   ICD (implantable cardioverter-defibrillator) in place (St Jude  pacer/ICD) 01/21/2009    Past Surgical History:  Procedure Laterality Date   BACK SURGERY  1972   lower back   BIV ICD GENERATOR CHANGEOUT N/A 10/02/2020   Procedure: BIV ICD GENERATOR CHANGEOUT;  Surgeon: Deboraha Sprang, MD;  Location: [facility];  Service: Cardiovascular;  Laterality: N/A;   CARDIAC DEFIBRILLATOR PLACEMENT     ICD St Jude; gen change 09-20-13   CERVICAL LAMINECTOMY  1985   COLONOSCOPY     CORONARY ANGIOPLASTY  2005,2006   CYSTOSCOPY W/ RETROGRADES  05/25/2012   Procedure: CYSTOSCOPY WITH RETROGRADE PYELOGRAM;  Surgeon: Molli Hazard, MD;  Location: [facility];  Service: Urology;  Laterality: Bilateral;   CYSTOSCOPY W/ URETERAL STENT PLACEMENT  07/08/2012   Procedure: CYSTOSCOPY WITH STENT REPLACEMENT;  Surgeon: Molli Hazard, MD;  Location: [facility];  Service: Urology;  Laterality: Left;   CYSTOSCOPY W/ URETERAL STENT REMOVAL  07/08/2012   Procedure: CYSTOSCOPY WITH STENT REMOVAL;  Surgeon: Molli Hazard, MD;  Location: [facility];  Service: Urology;  Laterality: Bilateral;   CYSTOSCOPY/RETROGRADE/URETEROSCOPY  07/08/2012   Procedure: CYSTOSCOPY/RETROGRADE/URETEROSCOPY;  Surgeon: Molli Hazard, MD;  Location: [facility];  Service: Urology;  Laterality: Left;   ESOPHAGOGASTRODUODENOSCOPY     HERNIA REPAIR  1989   bilateral   IMPLANTABLE CARDIOVERTER DEFIBRILLATOR (ICD) GENERATOR CHANGE N/A 09/20/2013   Procedure: ICD GENERATOR CHANGE;  Surgeon: Deboraha Sprang, MD;  Location: [facility];  Service: Cardiovascular;  Laterality: N/A;   TEE WITHOUT CARDIOVERSION N/A 12/10/2017   Procedure: TRANSESOPHAGEAL ECHOCARDIOGRAM (TEE);  Surgeon: Jerline Pain, MD;  Location: [facility];  Service: Cardiovascular;  Laterality: N/A;   TRANSURETHRAL RESECTION OF BLADDER TUMOR  05/25/2012   cold cup biopsy prostate   TRANSURETHRAL RESECTION OF BLADDER TUMOR  07/08/2012   Procedure: TRANSURETHRAL RESECTION OF BLADDER TUMOR (TURBT);  Surgeon: Molli Hazard,  MD;  Location: [facility];  Service: Urology;  Laterality: N/A;  CYSTO, TURBT W/ GYRUS, BILATERAL STENT REMOVAL, LEFT URETEROSCOPY WITH BIOPSY, POSSIBLE LEFT STENT PLACEMENT        Family History  Problem Relation Age of Onset   Coronary artery disease Other    Stroke Other     Social History   Tobacco Use   Smoking status: Former    Types: Cigarettes, Cigars    Quit date: 03/02/2004    Years since quitting: 17.5   Smokeless tobacco: Former    Quit date: 05/20/1999   Tobacco comments:    quit in 2005  Vaping Use   Vaping Use: Never used  Substance Use Topics   Alcohol use: No   Drug use: No    Home Medications Prior to Admission medications   Medication Sig Start Date End Date Taking? Authorizing Provider  acetaminophen (TYLENOL) 500 MG tablet Take 500-1,000 mg by mouth every 6 (six) hours as needed (pain.).    [provider]  apixaban (ELIQUIS) 2.5 MG TABS tablet Take by mouth 2 (two) times daily. 06/10/14   [provider]  carboxymethylcellulose (REFRESH PLUS) 0.5 % SOLN Place 1 drop into both eyes 3 (three) times daily as needed (dry/irritated eyes.).    [provider]  carvedilol (COREG) 6.25 MG tablet TAKE 1 TABLET BY MOUTH 2 TIMES DAILY WITH A MEAL. 03/06/21   Bensimhon, Shaune Pascal, MD  cephALEXin (KEFLEX) 500 MG capsule Take 4 capsules by mouth once. Take 45 minutes before dental procedure on tomorrow Patient not taking: Reported on 07/31/2021 04/19/21   [provider]  ezetimibe (ZETIA) 10 MG tablet Take 1 tablet by mouth daily with supper.    [provider]  FARXIGA 10 MG TABS tablet TAKE 1 TABLET BY MOUTH DAILY BEFORE BREAKFAST. 08/30/21   Bensimhon, Shaune Pascal, MD  ferrous sulfate 325 (65 FE) MG tablet Take 325 mg by mouth daily with supper.    [provider]  furosemide (LASIX) 20 MG tablet Take 2 tablets by mouth daily.    [provider]  hydrOXYzine (ATARAX/VISTARIL) 25 MG tablet Take 1 tablet by mouth at  bedtime as needed. 12/21/19   [provider]  Magnesium Hydroxide (MILK OF MAGNESIA PO) 28mls po prn constipation 04/24/09   [provider]  Multiple Vitamin (MULTIVITAMIN WITH MINERALS) TABS tablet Take 1 tablet by mouth every evening. Centrum Silver    [provider]  Multiple Vitamin (MV-ONE) CAPS daily. 09/17/16   [provider]  nitroGLYCERIN (NITROSTAT)  0.4 MG SL tablet DISSOLVE ONE TABLET UNDER THE TONGUE EVERY 5 MINUTES AS NEEDED FOR CHEST PAIN.  DO NOT EXCEED A TOTAL OF 3 DOSES IN 15 MINUTES 12/26/16   Larey Dresser, MD  ondansetron [facility]) 4 MG tablet Take 1 tablet po 3x a day prn for nausea and vomitting 06/10/18   [provider]  pantoprazole (PROTONIX) 40 MG tablet Take 40 mg by mouth daily before breakfast. 01/15/11   [provider]  pantoprazole (PROTONIX) 40 MG tablet Take 1 tablet by mouth daily.    [provider]  potassium chloride (KLOR-CON) 10 MEQ tablet Take 1 tablet by mouth daily.    [provider]  rosuvastatin (CRESTOR) 20 MG tablet Take 1 tablet by mouth daily.    [provider]  sodium chloride (OCEAN) 0.65 % SOLN nasal spray Place 1 spray into both nostrils as needed for congestion.    [provider]  SYNTHROID 75 MCG tablet TAKE 1 TABLET BY MOUTH DAILY BEFORE BREAKFAST. 04/20/21   Deboraha Sprang, MD  tamsulosin (FLOMAX) 0.4 MG CAPS capsule Take 0.4 mg by mouth at bedtime.  11/23/13   [provider]  triamcinolone (NASACORT) 55 MCG/ACT AERO nasal inhaler USE 2 SPRAYS IN [facility] NOSTRIL ONCE DAILY AT NIGHT 02/26/21   Rozetta Nunnery, MD    Allergies    Gabapentin, Ramipril, and Penicillins  Review of Systems   Review of Systems  All other systems reviewed and are negative.  Physical Exam Updated Vital Signs BP 120/68   Pulse 67   Temp 97.9 F (36.6 C) (Oral)   Resp 11   SpO2 100%   Physical Exam Vitals and nursing note reviewed.  Constitutional:       General: He is not in acute distress.    Appearance: He is well-developed.  HENT:     Head: Normocephalic and atraumatic.  Eyes:     Conjunctiva/sclera: Conjunctivae normal.     Pupils: Pupils are equal, round, and reactive to light.  Cardiovascular:     Rate and Rhythm: Normal rate and regular rhythm.     Heart sounds: No murmur heard.    Comments: Pacemaker present in the left upper chest Pulmonary:     Effort: Pulmonary effort is normal. No respiratory distress.     Breath sounds: Normal breath sounds. No wheezing or rales.  Abdominal:     General: There is no distension.     Palpations: Abdomen is soft.     Tenderness: There is no abdominal tenderness. There is right CVA tenderness. There is no guarding or rebound.  Musculoskeletal:        General: Tenderness present. Normal range of motion.     Cervical back: Normal range of motion and neck supple.       Back:     Right lower leg: No edema.     Left lower leg: No edema.  Skin:    General: Skin is warm and dry.     Findings: No erythema or rash.  Neurological:     Mental Status: He is alert and oriented to person, place, and time. Mental status is at baseline.     Sensory: No sensory deficit.     Motor: No weakness.  Psychiatric:        Mood and Affect: Mood normal.        Behavior: Behavior normal.    ED Results / Procedures / Treatments   Labs (all labs ordered are listed, but  only abnormal results are displayed) Labs Reviewed  COMPREHENSIVE METABOLIC PANEL - Abnormal; Notable for the following components:      Result Value   Glucose, Bld 119 (*)    BUN 30 (*)    Creatinine, Ser 2.79 (*)    Total Bilirubin 1.3 (*)    GFR, Estimated 21 (*)    All other components within normal limits  URINALYSIS, ROUTINE W REFLEX MICROSCOPIC - Abnormal; Notable for the following components:   Glucose, UA 150 (*)    Hgb urine dipstick SMALL (*)    All other components within normal limits  CBC WITH DIFFERENTIAL/PLATELET   LIPASE, BLOOD    EKG None  Radiology CT ABDOMEN PELVIS WO CONTRAST  Result Date: 09/03/2021 CLINICAL DATA:  87 year old with abdominal pain. EXAM: CT ABDOMEN AND PELVIS WITHOUT CONTRAST TECHNIQUE: Multidetector CT imaging of the abdomen and pelvis was performed following the standard protocol without IV contrast. COMPARISON:  CT 12/07/2017 FINDINGS: Lower chest: No acute airspace disease or pleural effusion. Pacemaker with leads in the right atrium, right ventricle, and coronary sinus. Borderline cardiomegaly. Small to moderate-sized hiatal hernia Hepatobiliary: No focal liver abnormality is seen. No gallstones, gallbladder wall thickening, or biliary dilatation. Pancreas: Parenchymal atrophy. No ductal dilatation or inflammation. Spleen: Normal in size without focal abnormality. Adrenals/Urinary Tract: Normal adrenal glands. Bilateral renal parenchymal thinning. Left greater than right renal atrophy. No hydronephrosis or renal calculi. No evidence of focal renal abnormality. Urinary bladder is partially distended. There are multiple small bladder diverticula. No bladder wall thickening. Stomach/Bowel: Sigmoid colonic diverticulosis without diverticulitis. Moderate colonic stool burden. Normal appendix. No small bowel dilatation or obstruction. Small to moderate hiatal hernia. The stomach is decompressed. Vascular/Lymphatic: Aortic atherosclerosis. No aortic aneurysm. No portal venous or mesenteric gas. No enlarged lymph nodes in the abdomen or pelvis. Reproductive: Prostatic calcifications. Other: No free air, free fluid, or intra-abdominal fluid collection. Minimal fat in the inguinal canals. Musculoskeletal: Bones are diffusely under mineralized. There is multilevel degenerative disc disease. There are no acute or suspicious osseous abnormalities. IMPRESSION: 1. No acute abnormality or explanation for abdominal pain. 2. Colonic diverticulosis without diverticulitis. 3. Small to moderate-sized hiatal  hernia. 4. Bilateral renal atrophy. Multiple small bladder diverticula. Aortic Atherosclerosis (ICD10-I70.0). Electronically Signed   By: Keith Rake M.D.   On: 09/03/2021 21:02    Procedures Procedures   Medications Ordered in ED Medications  acetaminophen (TYLENOL) tablet 1,000 mg (has no administration in time range)    ED Course  I have reviewed the triage vital signs and the nursing notes.  Pertinent labs & imaging results that were available during my care of the patient were reviewed by me and considered in my medical decision making (see chart for details).    MDM Rules/Calculators/A&P                           Elderly male presenting today with back pain that has been present for 2 weeks.  It is moving and does seem to be more localized to the right side but does move over to the left.  It is in the lower thoracic and upper lumbar region.  Occasional urinary issues unsure if that is related to this specific episode.  Patient went and saw orthopedics today and they sent him here for further evaluation.  Given patient's symptoms of been present for 2 weeks and he has no right lower quadrant pain low suspicion for appendicitis at this time.  He does have right flank pain but also has a little bit of left flank pain as well.  Possibility for pyelonephritis versus hydronephrosis from kidney stone but patient has no prior history of renal stones.  Low suspicion for mesenteric ischemia at this time or ruptured AAA.  Patient does have falls but the most recent one was 1 month ago and reports he did not hurt his back during this episode.  He is not having any radicular symptoms however still concerned that patient symptoms could be musculoskeletal in nature.  CBC within normal limits today, CMP with a creatinine of 2.79 which is mildly above his baseline with his most recent creatinine being 2.25.  Lipase within normal limits, UA with small amount of hemoglobin in the urine but no evidence  of infection.  We will do a CT to further evaluate.  Looking for any type of recurrent bladder cancer, renal stones.  No evidence of infection at this time.  9:43 PM Patient CT shows no evidence of acute abnormality.  No signs of renal stones, recurrent bladder cancer, diverticulitis or pleural effusions or lung pathology.  Low suspicion for PE at this time.  Low suspicion for vascular cause such as dissection or epidural hematoma.  Patient has no neurologic findings.  Tylenol did improve his pain but when they laid him down in CAT scan he started having spasm.  When trying to sit him up he develops a spasm which is improved when he lays back down.  Cyclobenzaprine seem to make his symptoms worse.  We will try giving patient 2 mg of Valium to see if that helps with the pain.  Discussed Voltaren gel and continued extra strength Tylenol.  Feel that patient will need follow-up back with [facility] for ongoing musculoskeletal back pain.  10:50 PM Pt improved after valium 2mg .  Pulse rate says 39 however on monitor pt's HR in the 60's consistently.  Pt clear for d/c.  MDM   Amount and/or Complexity of Data Reviewed Clinical lab tests: ordered and reviewed Tests in the radiology section of CPT: ordered and reviewed Independent visualization of images, tracings, or specimens: yes    Final Clinical Impression(s) / ED Diagnoses Final diagnoses:  Acute bilateral thoracic back pain  Acute bilateral low back pain without sciatica    Rx / DC Orders ED Discharge Orders     None        Blanchie Dessert, MD 09/03/21 2251

## 2021-09-03 NOTE — ED Provider Notes (Signed)
Emergency Medicine Provider Triage Evaluation Note  [name] , a 87 y.o. male  was evaluated in triage.  Pt complains of right flank pain and right back pain.  Started about a week ago, has been intermittent but recently became constant.  Not having any urinary symptoms, no prior abdominal surgeries.  Patient was seen at [facility] earlier today for an unrelated complaint, was having back pain and sent to the ED for additional work-up due to concerns about right lower quadrant pain versus possible nephrolithiasis..  Review of Systems  Positive: Abdominal pain, back pain, right flank pain Negative: Dysuria, hematuria, vomiting, nausea  Physical Exam  BP 122/73 (BP Location: Right Arm)   Pulse 70   Temp 98.2 F (36.8 C) (Oral)   Resp 16   SpO2 98%  Gen:   Awake, no distress   Resp:  Normal effort  MSK:   Moves extremities without difficulty  Other:  Right CVA tenderness, right lower quadrant tenderness  Medical Decision Making  Medically screening exam initiated at 3:13 PM.  Appropriate orders placed.  [name] was informed that the remainder of the evaluation will be completed by another provider, this initial triage assessment does not replace that evaluation, and the importance of remaining in the ED until their evaluation is complete.     Sherrill Raring, PA-C 09/03/21 1514    Daleen Bo, MD 09/03/21 1723

## 2021-09-03 NOTE — ED Notes (Signed)
Transported to CT 

## 2021-09-03 NOTE — Telephone Encounter (Signed)
Pt's pharmacy is requesting a refill on synthroid 75 mcg. Would Dr. Caryl Comes like to refill this medication? Please address

## 2021-09-03 NOTE — ED Notes (Signed)
Pt verbalized understanding of d/c instructions, meds, and followup care. Denies questions. VSS, no distress noted. Assisted to wheelchair with family.

## 2021-09-04 ENCOUNTER — Telehealth: Payer: Self-pay | Admitting: Internal Medicine

## 2021-09-04 MED ORDER — LEVOTHYROXINE SODIUM 75 MCG PO TABS
75.0000 ug | ORAL_TABLET | Freq: Every day | ORAL | 3 refills | Status: DC
Start: 2021-09-04 — End: 2021-09-07

## 2021-09-04 NOTE — Telephone Encounter (Signed)
Pt's medication was sent to pt's pharmacy as requested. Confirmation received.  °

## 2021-09-04 NOTE — Telephone Encounter (Signed)
*  STAT* If patient is at the pharmacy, call can be transferred to refill team.   1. Which medications need to be refilled? (please list name of each medication and dose if known)  SYNTHROID 75 MCG tablet  2. Which pharmacy/location (including street and city if local pharmacy) is medication to be sent to? [id number] - [place], [place] - [address]  3. Do they need a 30 day or 90 day supply? 90 day supply   PT IS COMPLETELY OUT OF THIS MEDICINE

## 2021-09-04 NOTE — Telephone Encounter (Signed)
Per Oda Kilts:  Shirley Friar, Adela Ports, RN  OK to refill, looks like Dr. Caryl Comes did it earlier this year as well.

## 2021-09-04 NOTE — Telephone Encounter (Signed)
Pt is calling requesting again for a refill on synthroid. Would Dr. Caryl Comes like to refill this medication? Please address

## 2021-09-04 NOTE — Telephone Encounter (Signed)
Spoke with pt's wife,DPR and advised refill for Synthroid has been approved.  Advised per Lily Kocher pt needs TSH, Free T3 and Free T4.  Requested pt come in for labs.  Pt's wife states pt was in the ED yesterday for severe back pain.  Pt has appointment with his PCP on Monday  for follow up and she will request PCP complete thyroid labs.  Pt's wife thanked Therapist, sports for the callback.

## 2021-09-07 ENCOUNTER — Emergency Department (HOSPITAL_COMMUNITY): Payer: Medicare Other

## 2021-09-07 ENCOUNTER — Encounter (HOSPITAL_COMMUNITY): Payer: Self-pay

## 2021-09-07 ENCOUNTER — Other Ambulatory Visit: Payer: Self-pay

## 2021-09-07 ENCOUNTER — Inpatient Hospital Stay (HOSPITAL_COMMUNITY)
Admission: EM | Admit: 2021-09-07 | Discharge: 2021-09-09 | DRG: 194 | Disposition: A | Discharge status: Home Health | Payer: Medicare Other | Attending: Internal Medicine | Admitting: Internal Medicine

## 2021-09-07 DIAGNOSIS — D631 Anemia in chronic kidney disease: Secondary | ICD-10-CM | POA: Diagnosis not present

## 2021-09-07 DIAGNOSIS — R531 Weakness: Secondary | ICD-10-CM

## 2021-09-07 DIAGNOSIS — Z7901 Long term (current) use of anticoagulants: Secondary | ICD-10-CM

## 2021-09-07 DIAGNOSIS — Z79890 Hormone replacement therapy (postmenopausal): Secondary | ICD-10-CM

## 2021-09-07 DIAGNOSIS — Z95810 Presence of automatic (implantable) cardiac defibrillator: Secondary | ICD-10-CM

## 2021-09-07 DIAGNOSIS — G4730 Sleep apnea, unspecified: Secondary | ICD-10-CM | POA: Diagnosis present

## 2021-09-07 DIAGNOSIS — R069 Unspecified abnormalities of breathing: Secondary | ICD-10-CM | POA: Diagnosis not present

## 2021-09-07 DIAGNOSIS — I959 Hypotension, unspecified: Secondary | ICD-10-CM

## 2021-09-07 DIAGNOSIS — M546 Pain in thoracic spine: Secondary | ICD-10-CM | POA: Diagnosis not present

## 2021-09-07 DIAGNOSIS — R509 Fever, unspecified: Secondary | ICD-10-CM | POA: Diagnosis not present

## 2021-09-07 DIAGNOSIS — M549 Dorsalgia, unspecified: Secondary | ICD-10-CM | POA: Diagnosis not present

## 2021-09-07 DIAGNOSIS — I252 Old myocardial infarction: Secondary | ICD-10-CM

## 2021-09-07 DIAGNOSIS — Z20822 Contact with and (suspected) exposure to covid-19: Secondary | ICD-10-CM | POA: Diagnosis not present

## 2021-09-07 DIAGNOSIS — Z8249 Family history of ischemic heart disease and other diseases of the circulatory system: Secondary | ICD-10-CM

## 2021-09-07 DIAGNOSIS — E876 Hypokalemia: Secondary | ICD-10-CM | POA: Diagnosis present

## 2021-09-07 DIAGNOSIS — N179 Acute kidney failure, unspecified: Secondary | ICD-10-CM

## 2021-09-07 DIAGNOSIS — R059 Cough, unspecified: Secondary | ICD-10-CM | POA: Diagnosis not present

## 2021-09-07 DIAGNOSIS — I255 Ischemic cardiomyopathy: Secondary | ICD-10-CM | POA: Diagnosis present

## 2021-09-07 DIAGNOSIS — E782 Mixed hyperlipidemia: Secondary | ICD-10-CM | POA: Diagnosis present

## 2021-09-07 DIAGNOSIS — N184 Chronic kidney disease, stage 4 (severe): Secondary | ICD-10-CM | POA: Diagnosis present

## 2021-09-07 DIAGNOSIS — I5022 Chronic systolic (congestive) heart failure: Secondary | ICD-10-CM | POA: Diagnosis present

## 2021-09-07 DIAGNOSIS — Z7984 Long term (current) use of oral hypoglycemic drugs: Secondary | ICD-10-CM

## 2021-09-07 DIAGNOSIS — R3129 Other microscopic hematuria: Secondary | ICD-10-CM | POA: Diagnosis present

## 2021-09-07 DIAGNOSIS — Z79899 Other long term (current) drug therapy: Secondary | ICD-10-CM

## 2021-09-07 DIAGNOSIS — I517 Cardiomegaly: Secondary | ICD-10-CM | POA: Diagnosis not present

## 2021-09-07 DIAGNOSIS — I480 Paroxysmal atrial fibrillation: Secondary | ICD-10-CM | POA: Diagnosis present

## 2021-09-07 DIAGNOSIS — Z880 Allergy status to penicillin: Secondary | ICD-10-CM

## 2021-09-07 DIAGNOSIS — E039 Hypothyroidism, unspecified: Secondary | ICD-10-CM | POA: Diagnosis present

## 2021-09-07 DIAGNOSIS — I2510 Atherosclerotic heart disease of native coronary artery without angina pectoris: Secondary | ICD-10-CM | POA: Diagnosis present

## 2021-09-07 DIAGNOSIS — Z85828 Personal history of other malignant neoplasm of skin: Secondary | ICD-10-CM

## 2021-09-07 DIAGNOSIS — I130 Hypertensive heart and chronic kidney disease with heart failure and stage 1 through stage 4 chronic kidney disease, or unspecified chronic kidney disease: Secondary | ICD-10-CM | POA: Diagnosis present

## 2021-09-07 DIAGNOSIS — N400 Enlarged prostate without lower urinary tract symptoms: Secondary | ICD-10-CM | POA: Diagnosis present

## 2021-09-07 DIAGNOSIS — R062 Wheezing: Secondary | ICD-10-CM | POA: Diagnosis not present

## 2021-09-07 DIAGNOSIS — J101 Influenza due to other identified influenza virus with other respiratory manifestations: Secondary | ICD-10-CM | POA: Diagnosis not present

## 2021-09-07 DIAGNOSIS — Z8551 Personal history of malignant neoplasm of bladder: Secondary | ICD-10-CM

## 2021-09-07 DIAGNOSIS — Z87891 Personal history of nicotine dependence: Secondary | ICD-10-CM

## 2021-09-07 DIAGNOSIS — E860 Dehydration: Secondary | ICD-10-CM

## 2021-09-07 DIAGNOSIS — R140 Abdominal distension (gaseous): Secondary | ICD-10-CM

## 2021-09-07 DIAGNOSIS — I4891 Unspecified atrial fibrillation: Secondary | ICD-10-CM | POA: Diagnosis present

## 2021-09-07 DIAGNOSIS — Z888 Allergy status to other drugs, medicaments and biological substances status: Secondary | ICD-10-CM

## 2021-09-07 DIAGNOSIS — K219 Gastro-esophageal reflux disease without esophagitis: Secondary | ICD-10-CM | POA: Diagnosis present

## 2021-09-07 DIAGNOSIS — Z955 Presence of coronary angioplasty implant and graft: Secondary | ICD-10-CM

## 2021-09-07 DIAGNOSIS — W19XXXA Unspecified fall, initial encounter: Secondary | ICD-10-CM

## 2021-09-07 DIAGNOSIS — R0902 Hypoxemia: Secondary | ICD-10-CM | POA: Diagnosis not present

## 2021-09-07 LAB — URINALYSIS, ROUTINE W REFLEX MICROSCOPIC
Bacteria, UA: NONE SEEN
Bilirubin Urine: NEGATIVE
Glucose, UA: >=500 mg/dL — AB
Ketones, ur: NEGATIVE mg/dL
Leukocytes,Ua: NEGATIVE
Nitrite: NEGATIVE
Protein, ur: NEGATIVE mg/dL
Specific Gravity, Urine: 1.017 (ref 1.005–1.030)
pH: 5 (ref 5.0–8.0)

## 2021-09-07 LAB — CBC WITH DIFFERENTIAL/PLATELET
Abs Immature Granulocytes: 0.03 10*3/uL (ref 0.00–0.07)
Basophils Absolute: 0.1 10*3/uL (ref 0.0–0.1)
Basophils Relative: 1 %
Eosinophils Absolute: 0.2 10*3/uL (ref 0.0–0.5)
Eosinophils Relative: 2 %
HCT: 45.2 % (ref 39.0–52.0)
Hemoglobin: 14.3 g/dL (ref 13.0–17.0)
Immature Granulocytes: 0 %
Lymphocytes Relative: 8 %
Lymphs Abs: 0.6 10*3/uL — ABNORMAL LOW (ref 0.7–4.0)
MCH: 29.7 pg (ref 26.0–34.0)
MCHC: 31.6 g/dL (ref 30.0–36.0)
MCV: 94 fL (ref 80.0–100.0)
Monocytes Absolute: 0.7 10*3/uL (ref 0.1–1.0)
Monocytes Relative: 9 %
Neutro Abs: 6.1 10*3/uL (ref 1.7–7.7)
Neutrophils Relative %: 80 %
Platelets: 214 10*3/uL (ref 150–400)
RBC: 4.81 MIL/uL (ref 4.22–5.81)
RDW: 13.2 % (ref 11.5–15.5)
WBC: 7.6 10*3/uL (ref 4.0–10.5)
nRBC: 0 % (ref 0.0–0.2)

## 2021-09-07 LAB — COMPREHENSIVE METABOLIC PANEL
ALT: 13 U/L (ref 0–44)
AST: 28 U/L (ref 15–41)
Albumin: 3.6 g/dL (ref 3.5–5.0)
Alkaline Phosphatase: 89 U/L (ref 38–126)
Anion gap: 10 (ref 5–15)
BUN: 31 mg/dL — ABNORMAL HIGH (ref 8–23)
CO2: 27 mmol/L (ref 22–32)
Calcium: 9.2 mg/dL (ref 8.9–10.3)
Chloride: 103 mmol/L (ref 98–111)
Creatinine, Ser: 2.68 mg/dL — ABNORMAL HIGH (ref 0.61–1.24)
GFR, Estimated: 22 mL/min — ABNORMAL LOW (ref >60)
Glucose, Bld: 130 mg/dL — ABNORMAL HIGH (ref 70–99)
Potassium: 4.7 mmol/L (ref 3.5–5.1)
Sodium: 140 mmol/L (ref 135–145)
Total Bilirubin: 1 mg/dL (ref 0.3–1.2)
Total Protein: 7.3 g/dL (ref 6.5–8.1)

## 2021-09-07 LAB — RESP PANEL BY RT-PCR (FLU A&B, COVID) ARPGX2
Influenza A by PCR: POSITIVE — AB
Influenza B by PCR: NEGATIVE
SARS Coronavirus 2 by RT PCR: NEGATIVE

## 2021-09-07 LAB — LACTIC ACID, PLASMA
Lactic Acid, Venous: 2 mmol/L (ref 0.5–1.9)
Lactic Acid, Venous: 1.3 mmol/L (ref 0.5–1.9)

## 2021-09-07 LAB — BRAIN NATRIURETIC PEPTIDE: B Natriuretic Peptide: 431.9 pg/mL — ABNORMAL HIGH (ref 0.0–100.0)

## 2021-09-07 MED ORDER — ACETAMINOPHEN 650 MG RE SUPP: 650.0000 mg | Freq: Four times a day (QID) | RECTAL | Status: DC | PRN

## 2021-09-07 MED ORDER — ONDANSETRON HCL 4 MG/2ML IJ SOLN: 4.0000 mg | Freq: Four times a day (QID) | INTRAMUSCULAR | Status: DC | PRN

## 2021-09-07 MED ORDER — ACETAMINOPHEN 500 MG PO TABS
1000.0000 mg | ORAL_TABLET | Freq: Once | ORAL | Status: AC
  Administered 2021-09-07: 1000 mg via ORAL
  Filled 2021-09-07: qty 2

## 2021-09-07 MED ORDER — SENNOSIDES-DOCUSATE SODIUM 8.6-50 MG PO TABS: 1.0000 | ORAL_TABLET | Freq: Every evening | ORAL | Status: DC | PRN

## 2021-09-07 MED ORDER — CARVEDILOL 6.25 MG PO TABS
6.2500 mg | ORAL_TABLET | Freq: Two times a day (BID) | ORAL | Status: DC
  Filled 2021-09-07: qty 1

## 2021-09-07 MED ORDER — SODIUM CHLORIDE 0.9% FLUSH
3.0000 mL | Freq: Two times a day (BID) | INTRAVENOUS | Status: DC
  Administered 2021-09-07 – 2021-09-09 (×3): 3 mL via INTRAVENOUS

## 2021-09-07 MED ORDER — PANTOPRAZOLE SODIUM 40 MG PO TBEC
40.0000 mg | DELAYED_RELEASE_TABLET | Freq: Every evening | ORAL | Status: DC
  Administered 2021-09-07 – 2021-09-08 (×2): 40 mg via ORAL
  Filled 2021-09-07 (×2): qty 1

## 2021-09-07 MED ORDER — EZETIMIBE 10 MG PO TABS
10.0000 mg | ORAL_TABLET | Freq: Every day | ORAL | Status: DC
  Administered 2021-09-07 – 2021-09-08 (×2): 10 mg via ORAL
  Filled 2021-09-07 (×3): qty 1

## 2021-09-07 MED ORDER — ACETAMINOPHEN 325 MG PO TABS: 650.0000 mg | ORAL_TABLET | Freq: Four times a day (QID) | ORAL | Status: DC | PRN

## 2021-09-07 MED ORDER — LEVOTHYROXINE SODIUM 75 MCG PO TABS
75.0000 ug | ORAL_TABLET | Freq: Every day | ORAL | Status: DC
Start: 2021-09-08 — End: 2021-09-09
  Administered 2021-09-08 – 2021-09-09 (×2): 75 ug via ORAL
  Filled 2021-09-07 (×2): qty 1

## 2021-09-07 MED ORDER — LACTATED RINGERS IV BOLUS
500.0000 mL | Freq: Once | INTRAVENOUS | Status: AC
  Administered 2021-09-07: 500 mL via INTRAVENOUS

## 2021-09-07 MED ORDER — APIXABAN 2.5 MG PO TABS
2.5000 mg | ORAL_TABLET | Freq: Two times a day (BID) | ORAL | Status: DC
  Administered 2021-09-07 – 2021-09-09 (×4): 2.5 mg via ORAL
  Filled 2021-09-07 (×5): qty 1

## 2021-09-07 MED ORDER — OSELTAMIVIR PHOSPHATE 30 MG PO CAPS
30.0000 mg | ORAL_CAPSULE | Freq: Once | ORAL | Status: AC
  Administered 2021-09-07: 30 mg via ORAL
  Filled 2021-09-07 (×2): qty 1

## 2021-09-07 MED ORDER — ONDANSETRON HCL 4 MG PO TABS: 4.0000 mg | ORAL_TABLET | Freq: Four times a day (QID) | ORAL | Status: DC | PRN

## 2021-09-07 MED ORDER — LACTATED RINGERS IV SOLN: INTRAVENOUS | Status: DC

## 2021-09-07 MED ORDER — BENZONATATE 100 MG PO CAPS
200.0000 mg | ORAL_CAPSULE | Freq: Once | ORAL | Status: AC
  Administered 2021-09-07: 200 mg via ORAL
  Filled 2021-09-07: qty 2

## 2021-09-07 MED ORDER — OSELTAMIVIR PHOSPHATE 30 MG PO CAPS
30.0000 mg | ORAL_CAPSULE | Freq: Every day | ORAL | Status: DC
  Administered 2021-09-08 – 2021-09-09 (×2): 30 mg via ORAL
  Filled 2021-09-07 (×2): qty 1

## 2021-09-07 MED ORDER — GUAIFENESIN ER 600 MG PO TB12
600.0000 mg | ORAL_TABLET | Freq: Two times a day (BID) | ORAL | Status: DC
  Administered 2021-09-07 – 2021-09-09 (×4): 600 mg via ORAL
  Filled 2021-09-07 (×4): qty 1

## 2021-09-07 MED ORDER — ROSUVASTATIN CALCIUM 20 MG PO TABS
20.0000 mg | ORAL_TABLET | Freq: Every evening | ORAL | Status: DC
  Administered 2021-09-07 – 2021-09-08 (×2): 20 mg via ORAL
  Filled 2021-09-07 (×2): qty 1

## 2021-09-07 MED ORDER — TAMSULOSIN HCL 0.4 MG PO CAPS
0.4000 mg | ORAL_CAPSULE | Freq: Every day | ORAL | Status: DC
  Administered 2021-09-07 – 2021-09-08 (×2): 0.4 mg via ORAL
  Filled 2021-09-07 (×2): qty 1

## 2021-09-07 NOTE — ED Provider Notes (Signed)
Emergency Medicine Provider Triage Evaluation Note  [name] , a 87 y.o. male  was evaluated in triage.  Pt complains of bilateral back pain with dysuria, urinary frequency, incontinence, and urgency.  History of bladder cancer.  Was seen in the emergency department a couple of days ago with full work-up.  Review of Systems  Positive: Back pain, dysuria, frequency and urgency, cough Negative: Fevers, chills, chest pain, shortness of breath  Physical Exam  BP (!) 125/54   Pulse 77   Temp 99.4 F (37.4 C)   Resp 20   SpO2 92%  Gen:   Awake, no distress   Resp:  Normal effort  MSK:   Moves extremities without difficulty  Other:  Abdomen soft, nondistended, nontender.  No CVAT bilaterally.  Lidoderm patch in the right lower back.  Medical Decision Making  Medically screening exam initiated at 2:51 PM.  Appropriate orders placed.  [name] was informed that the remainder of the evaluation will be completed by another provider, this initial triage assessment does not replace that evaluation, and the importance of remaining in the ED until their evaluation is complete.  This chart was dictated using voice recognition software, Dragon. Despite the best efforts of this provider to proofread and correct errors, errors may still occur which can change documentation meaning.    Emeline Darling, PA-C 09/07/21 1452    Jeanell Sparrow, DO 09/08/21 1556

## 2021-09-07 NOTE — H&P (Signed)
+ History and Physical    DIN BOOKWALTER WJX:[id number] DOB: 11/15/1931 DOA: 09/07/2021  PCP: [name], MD  Patient coming from: Home via EMS  I have personally briefly reviewed patient's old medical records in [place]  Chief Complaint: Cough, weakness  HPI: YUSEF LAMP is a 87 y.o. male with medical history significant for HFrEF (EF 20-25%) s/p BiV ICD, CAD s/p multivessel stenting, PAF on Eliquis, CKD stage IV, HTN, HLD, hypothyroidism, BPH who presents to the ED for evaluation of cough and weakness.  Patient initially came to the ED 09/03/2021 for evaluation of bilateral thoracic back pain.  CT abdomen/pelvis without contrast was obtained and was negative for acute abnormality.  Patient was discharged to home with recommendation to follow-up with orthopedics as an outpatient.  Patient states that over the last few days he has had cough occasionally productive of yellow or white sputum.  He has had body aches and generalized weakness.  He was experiencing mid back pain which started on his right side and radiated towards the left, described as a cramping muscle spasm type sensation.  He has not had any shooting pains down his legs.  He says this feels better now.  He has had low appetite and not eating much.  He says he has occasional dysuria but that improves when he takes his Flomax.  He denies subjective fevers, chills, or diaphoresis.  He denies any chest pain.  He reports occasional dyspnea when lying down at night.  Today he felt so weak he was unable to get out of bed.  His wife and another family member tried to get up and stand up and walk out of bed but he dropped to the ground and they were unable to get him back up.  He did not injure himself.   ED Course:  Initial vitals showed BP 125/54, pulse 77, RR 20, temp 99.4 F, SPO2 90% on room air.  Rectal temperature is 103.2 F.  Labs show WBC 7.6, hemoglobin 14.3, platelets 214,000, sodium 140, potassium 4.7,  bicarb 27, BUN 31, creatinine 2.68, serum glucose 130, LFTs within normal limits, lactic acid 2.0, BNP 431.9.  Influenza A is positive.  COVID-19 is negative.  Urinalysis negative for UTI.  2 view chest x-ray shows stable mild cardiomegaly and chronic vascular congestion without acute focal consolidation, edema, effusion.  ICD seen in place.  Patient was given 500 cc LR, 1 dose of Tamiflu 30 mg, and the hospitalist service was consulted to admit for further evaluation and management.  Review of Systems: All systems reviewed and are negative except as documented in history of present illness above.   Past Medical History:  Diagnosis Date   Acute bronchitis with COPD ([place]) 09/10/2016   Anemia    Atrial fibrillation or flutter    maintaining sinus on amiodarone     Bladder cancer ([place]) dx'd 05/2012   BPH (benign prostatic hyperplasia)    CAD (coronary artery disease)     a. s/p anterior MI 12/05 c/b shock -> stent LAD   b. s/p stenting OM-1, 2/06   CHF (congestive heart failure) ([place])    due to ischemic CM  a. EF 20-30%. (Nov 2008)   b. s/p St. Jude BiV-ICD    c. CPX 07/2008  pvo2 16.3 (63% predicted) slope 34 RER 1.08 O2 pulse 93%   Cough    occasional /productive, no fever/ not new   CRI (chronic renal insufficiency)    (baseline 2.0-2.2)/ recent hospitalization  8/13   GERD (gastroesophageal reflux disease)    hx Barretts esophagitis   History of blood transfusion    following coumadin  usage   HTN (hypertension)    EKG 05/14/12,Chest x ray 8/13, last ICD interrogation 8/13 EPIC   Hyperlipidemia    Hypothyroidism    Left ventricular lead failure to capture on the ring electrode 12/16/2013   Major depression, single episode, in complete remission ([place]) 08/16/2016   Neuromuscular disorder ([place])    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"   no neurologist   Osteoarthritis 03/08/2011   Skin cancer    basal cells   facial x 4, 1 right forearm   Sleep apnea    STOP BANG SCORE 4    Past  Surgical History:  Procedure Laterality Date   BACK SURGERY  1972   lower back   BIV ICD GENERATOR CHANGEOUT N/A 10/02/2020   Procedure: BIV ICD GENERATOR CHANGEOUT;  Surgeon: Deboraha Sprang, MD;  Location: [facility];  Service: Cardiovascular;  Laterality: N/A;   CARDIAC DEFIBRILLATOR PLACEMENT     ICD St Jude; gen change 09-20-13   CERVICAL LAMINECTOMY  1985   COLONOSCOPY     CORONARY ANGIOPLASTY  2005,2006   CYSTOSCOPY W/ RETROGRADES  05/25/2012   Procedure: CYSTOSCOPY WITH RETROGRADE PYELOGRAM;  Surgeon: Molli Hazard, MD;  Location: [facility];  Service: Urology;  Laterality: Bilateral;   CYSTOSCOPY W/ URETERAL STENT PLACEMENT  07/08/2012   Procedure: CYSTOSCOPY WITH STENT REPLACEMENT;  Surgeon: Molli Hazard, MD;  Location: [facility];  Service: Urology;  Laterality: Left;   CYSTOSCOPY W/ URETERAL STENT REMOVAL  07/08/2012   Procedure: CYSTOSCOPY WITH STENT REMOVAL;  Surgeon: Molli Hazard, MD;  Location: [facility];  Service: Urology;  Laterality: Bilateral;   CYSTOSCOPY/RETROGRADE/URETEROSCOPY  07/08/2012   Procedure: CYSTOSCOPY/RETROGRADE/URETEROSCOPY;  Surgeon: Molli Hazard, MD;  Location: [facility];  Service: Urology;  Laterality: Left;   ESOPHAGOGASTRODUODENOSCOPY     HERNIA REPAIR  1989   bilateral   IMPLANTABLE CARDIOVERTER DEFIBRILLATOR (ICD) GENERATOR CHANGE N/A 09/20/2013   Procedure: ICD GENERATOR CHANGE;  Surgeon: Deboraha Sprang, MD;  Location[facility];  Service: Cardiovascular;  Laterality: N/A;   TEE WITHOUT CARDIOVERSION N/A 12/10/2017   Procedure: TRANSESOPHAGEAL ECHOCARDIOGRAM (TEE);  Surgeon: Jerline Pain, MD;  Location: [facility] ENDOSCOPY;  Service: Cardiovascular;  Laterality: N/A;   TRANSURETHRAL RESECTION OF BLADDER TUMOR  05/25/2012   cold cup biopsy prostate   TRANSURETHRAL RESECTION OF BLADDER TUMOR  07/08/2012   Procedure: TRANSURETHRAL RESECTION OF BLADDER TUMOR (TURBT);  Surgeon: Molli Hazard, MD;  Location: [facility];  Service:  Urology;  Laterality: N/A;  CYSTO, TURBT W/ GYRUS, BILATERAL STENT REMOVAL, LEFT URETEROSCOPY WITH BIOPSY, POSSIBLE LEFT STENT PLACEMENT     Social History:  reports that he quit smoking about 17 years ago. His smoking use included cigarettes and cigars. He quit smokeless tobacco use about 22 years ago. He reports that he does not drink alcohol and does not use drugs.  Allergies  Allergen Reactions   Gabapentin Itching   Ramipril Cough   Amoxicillin Hives and Rash   Penicillin G Sodium Itching and Rash   Penicillins Itching and Rash    Has patient had a PCN reaction causing immediate rash, facial/tongue/throat swelling, SOB or lightheadedness with hypotension: No Has patient had a PCN reaction causing severe rash involving mucus membranes or skin necrosis: Yes-back and hands. Has patient had a PCN reaction that required hospitalization: No Has patient had  a PCN reaction occurring within the last 10 years: Yes If all of the above answers are "NO", then may proceed with Cephalosporin use.    Family History  Problem Relation Age of Onset   Coronary artery disease Other    Stroke Other      Prior to Admission medications   Medication Sig Start Date End Date Taking? Authorizing Provider  acetaminophen (TYLENOL) 500 MG tablet Take 500-1,000 mg by mouth every 6 (six) hours as needed (for pain).   Yes [provider]  apixaban (ELIQUIS) 2.5 MG TABS tablet Take 2.5 mg by mouth 2 (two) times daily. 06/10/14  Yes [provider]  carboxymethylcellulose (REFRESH PLUS) 0.5 % SOLN Place 1 drop into both eyes 3 (three) times daily as needed (dry/irritated eyes.).   Yes [provider]  carvedilol (COREG) 6.25 MG tablet TAKE 1 TABLET BY MOUTH 2 TIMES DAILY WITH A MEAL. Patient taking differently: Take 6.25 mg by mouth 2 (two) times daily. 03/06/21  Yes Bensimhon, Shaune Pascal, MD  cephALEXin (KEFLEX) 500 MG capsule Take 2,000 mg by mouth See admin instructions. Take 2,000 mg  by mouth 45 minutes before dental procedures   Yes [provider]  diazepam (VALIUM) 2 MG tablet Take 1 tablet (2 mg total) by mouth every 8 (eight) hours as needed for muscle spasms. 09/03/21  Yes Plunkett, Loree Fee, MD  ezetimibe (ZETIA) 10 MG tablet Take 10 mg by mouth daily with supper.   Yes [provider]  FARXIGA 10 MG TABS tablet TAKE 1 TABLET BY MOUTH DAILY BEFORE BREAKFAST. Patient taking differently: 10 mg daily before breakfast. 08/30/21  Yes Bensimhon, Shaune Pascal, MD  ferrous sulfate 325 (65 FE) MG tablet Take 325 mg by mouth daily with supper.   Yes [provider]  furosemide (LASIX) 20 MG tablet Take 20 mg by mouth 2 (two) times daily.   Yes [provider]  KLOR-CON M10 10 MEQ tablet Take 10 mEq by mouth in the morning.   Yes [provider]  lidocaine (LIDODERM) 5 % Place 1 patch onto the skin daily. 09/03/21  Yes [provider]  Magnesium Hydroxide (MILK OF MAGNESIA PO) Take 20 mLs by mouth daily as needed (for constipation). 04/24/09  Yes [provider]  Multiple Vitamin (MULTIVITAMIN WITH MINERALS) TABS tablet Take 1 tablet by mouth daily with supper. Centrum Silver   Yes [provider]  nitroGLYCERIN (NITROSTAT) 0.4 MG SL tablet DISSOLVE ONE TABLET UNDER THE TONGUE EVERY 5 MINUTES AS NEEDED FOR CHEST PAIN.  DO NOT EXCEED A TOTAL OF 3 DOSES IN 15 MINUTES Patient taking differently: 0.4 mg every 5 (five) minutes as needed for chest pain (AND DO NOT EXCEED 3 DOSES IN 15 MINUTES). 12/26/16  Yes Larey Dresser, MD  ondansetron (ZOFRAN) 4 MG tablet Take 4 mg by mouth 3 (three) times daily as needed for nausea or vomiting. 06/10/18  Yes [provider]  pantoprazole (PROTONIX) 40 MG tablet Take 40 mg by mouth every evening. 01/15/11  Yes [provider]  rosuvastatin (CRESTOR) 20 MG tablet Take 20 mg by mouth every evening.   Yes [provider]  sodium chloride (OCEAN) 0.65 % SOLN nasal  spray Place 1 spray into both nostrils as needed for congestion.   Yes [provider]  SYNTHROID 75 MCG tablet Take 75 mcg by mouth daily before breakfast.   Yes [provider]  tamsulosin (FLOMAX) 0.4 MG CAPS capsule Take 0.4 mg by mouth at bedtime.  11/23/13  Yes [provider]  triamcinolone (NASACORT) 55 MCG/ACT AERO nasal inhaler USE 2 SPRAYS IN EACH NOSTRIL ONCE DAILY AT NIGHT Patient taking differently: 2 sprays daily as needed (for allergies or rhinitis). 02/26/21  Yes Rozetta Nunnery, MD  hydrOXYzine (ATARAX/VISTARIL) 25 MG tablet Take 1 tablet by mouth at bedtime as needed. Patient not taking: Reported on 09/07/2021 12/21/19   [provider]  levothyroxine (SYNTHROID) 75 MCG tablet Take 1 tablet (75 mcg total) by mouth daily before breakfast. Patient not taking: Reported on 09/07/2021 09/04/21   Deboraha Sprang, MD    Physical Exam: Vitals:   09/07/21 1705 09/07/21 1715 09/07/21 1800 09/07/21 1826  BP: (!) 123/53 (!) 135/59  (!) 97/41  Pulse: 72 80  66  Resp: (!) 27 (!) 31  (!) 21  Temp:    99 F (37.2 C)  TempSrc:    Oral  SpO2: 94% 99%  95%  Weight:   81.7 kg    Constitutional: Elderly man resting in bed with head elevated, appears tired but in NAD, calm, comfortable Eyes: PERRL, lids and conjunctivae normal ENMT: Mucous membranes are dry. Posterior pharynx clear of any exudate or lesions.Normal dentition.  Neck: normal, supple, no masses. Respiratory: clear to auscultation bilaterally, no wheezing, no crackles. Normal respiratory effort. No accessory muscle use.  Cardiovascular: Regular rate and rhythm, no murmurs / rubs / gallops. No extremity edema. 2+ pedal pulses.  ICD in place left upper chest wall. Abdomen: no tenderness, no masses palpated. No hepatosplenomegaly. Musculoskeletal: no clubbing / cyanosis. No joint deformity upper and lower extremities. Good ROM, no contractures. Normal muscle tone.  No tenderness over the  spinous processes or paraspinal muscles. Skin: Dry.  No rashes, lesions, ulcers. No induration Neurologic: CN 2-12 grossly intact. Sensation intact. Strength 5/5 in all 4.  Psychiatric: Normal judgment and insight. Alert and oriented x 3. Normal mood.   Labs on Admission: I have personally reviewed following labs and imaging studies  CBC: Recent Labs  Lab 09/03/21 1513 09/07/21 1450  WBC 9.1 7.6  NEUTROABS 5.8 6.1  HGB 15.0 14.3  HCT 48.1 45.2  MCV 93.2 94.0  PLT 247 505   Basic Metabolic Panel: Recent Labs  Lab 09/03/21 1513 09/07/21 1450  NA 140 140  K 4.1 4.7  CL 101 103  CO2 26 27  GLUCOSE 119* 130*  BUN 30* 31*  CREATININE 2.79* 2.68*  CALCIUM 9.3 9.2   GFR: Estimated Creatinine Clearance: 20.1 mL/min (A) (by C-G formula based on SCr of 2.68 mg/dL (H)). Liver Function Tests: Recent Labs  Lab 09/03/21 1513 09/07/21 1450  AST 21 28  ALT 9 13  ALKPHOS 83 89  BILITOT 1.3* 1.0  PROT 7.7 7.3  ALBUMIN 3.7 3.6   Recent Labs  Lab 09/03/21 1513  LIPASE 42   No results for input(s): AMMONIA in the last 168 hours. Coagulation Profile: No results for input(s): INR, PROTIME in the last 168 hours. Cardiac Enzymes: No results for input(s): CKTOTAL, CKMB, CKMBINDEX, TROPONINI in the last 168 hours. BNP (last 3 results) No results for input(s): PROBNP in the last 8760 hours. HbA1C: No results for input(s): HGBA1C in the last 72 hours. CBG: No results for input(s): GLUCAP in the last 168 hours. Lipid Profile: No results for input(s): CHOL, HDL, LDLCALC, TRIG, CHOLHDL, LDLDIRECT in the last 72 hours. Thyroid Function Tests: No results for input(s): TSH, T4TOTAL, FREET4, T3FREE, THYROIDAB in the last 72 hours. Anemia Panel: No results for input(s):  VITAMINB12, FOLATE, FERRITIN, TIBC, IRON, RETICCTPCT in the last 72 hours. Urine analysis:    Component Value Date/Time   COLORURINE YELLOW 09/07/2021 [place] 09/07/2021 1744   LABSPEC 1.017  09/07/2021 1744   PHURINE 5.0 09/07/2021 1744   GLUCOSEU >=500 (A) 09/07/2021 1744   HGBUR MODERATE (A) 09/07/2021 1744   BILIRUBINUR NEGATIVE 09/07/2021 1744   KETONESUR NEGATIVE 09/07/2021 1744   PROTEINUR NEGATIVE 09/07/2021 1744   UROBILINOGEN 0.2 08/15/2013 1132   NITRITE NEGATIVE 09/07/2021 1744   LEUKOCYTESUR NEGATIVE 09/07/2021 1744    Radiological Exams on Admission: DG Chest 2 View  Result Date: 09/07/2021 CLINICAL DATA:  Cough with congestion, wheezing and fever. EXAM: CHEST - 2 VIEW COMPARISON:  Radiographs 10/12/2017.  CT 05/06/2005. FINDINGS: The lateral view is limited by a metallic structure overlying the thoracic spine. Left subclavian biventricular AICD leads appear unchanged. The heart size and mediastinal contours are stable. There is mild chronic vascular congestion, but no evidence of edema, confluent airspace opacity, pleural effusion or pneumothorax. No acute osseous findings are evident with limited assessment of the spine due to artifact. IMPRESSION: Stable mild cardiomegaly and chronic vascular congestion. No acute cardiopulmonary process identified. Electronically Signed   By: Richardean Sale M.D.   On: 09/07/2021 16:42    EKG: Not performed.  Assessment/Plan Principal Problem:   Influenza A Active Problems:   Mixed hyperlipidemia   CAD, NATIVE VESSEL   Atrial fibrillation (HCC)   Chronic systolic CHF (congestive heart failure) (HCC)-EF 25%   CKD (chronic kidney disease) stage 4, GFR 15-29 ml/min (HCC)   Generalized weakness   Hypothyroidism   [name] is a 87 y.o. male with medical history significant for HFrEF (EF 20-25%) s/p BiV ICD, CAD s/p multivessel stenting, PAF on Eliquis, CKD stage IV, HTN, HLD, hypothyroidism, BPH who is admitted with influenza A.  Influenza A: Presenting with fever, generalized weakness, cough, inability to maintain adequate oral intake or ambulate even with the use of his walker.  CXR without evidence of  pneumonia. -Started on renal dosed Tamiflu 30 mg daily -Gentle IV fluid hydration overnight -Continue supportive care  Generalized weakness/back pain: Generalized weakness secondary to influenza A.  Previously reported back pain likely muscle spasm, this has resolved on admission. -PT/OT eval  HFrEF s/p BiV ICD: Last EF 20-25%.  Appears volume depleted on admission. -Holding Lasix, on gentle IV fluid hydration overnight -Monitor strict I/O's and daily weights -Continue Coreg -[place] for now -Not on ACE/ARB/spironolactone due to renal dysfunction  Paroxysmal atrial fibrillation: Stable with controlled rate.  Continue Coreg and Eliquis.  CKD stage IV: Relatively stable, continue to monitor.  CAD: Stable, denies any chest pain.  Not on aspirin as he is on Eliquis.  Continue rosuvastatin and Zetia.  Hypothyroidism: Continue Synthroid.  Continue  Hyperlipidemia: Rosuvastatin and Zetia.  BPH: Continue Flomax.  DVT prophylaxis: Eliquis Code Status: Full code, confirmed with patient on admission Family Communication: Discussed with patient spouse at bedside Disposition Plan: From home, dispo pending clinical progress and PT/OT eval Consults called: None Level of care: Telemetry Cardiac Admission status:  Status is: Observation  The patient remains OBS appropriate and will d/c before 2 midnights.   Zada Finders MD [facility]  If 7PM-7AM, please contact night-coverage [email or web address]  09/07/2021, 6:50 PM

## 2021-09-07 NOTE — ED Provider Notes (Signed)
[facility] EMERGENCY DEPARTMENT Provider Note   CSN: [id number] Arrival date & time: 09/07/21  1340     History Chief Complaint  Patient presents with   Back Pain   Weakness    [name] is a 87 y.o. male.  Patient is an 87 year old male with a history of CHF, CAD status post stenting and pacemaker, CKD, hypertension, bladder cancer, atrial fibrillation on Eliquis who is presenting today with ongoing problems.  Patient was seen 5 days ago after having 2-1/2 to 3 weeks of mid to lower back pain that had not been improving.  His wife reports the pain has continued but in the last 2 to 3 days he started to have a productive cough, generalized weakness, poor appetite and just seems to be getting worse.  Today they were trying to get him out of bed and he was unable to get up by himself.  He did fall down to his knees and his wife and relative had to help him up.  He has not had anything to eat today and continues to complain of pain in his mid back.  It is mostly on the right but does move over to the left.  The pain does not shoot down into his legs but he just feels like his legs are heavy.  No sensory changes.  He denies any shortness of breath but reports every time he coughs the pain worsens.  He is also having some burning with urination that started 2 days ago as well.  He continues to take his medications has been trying lidocaine patches, Tylenol and Valium intermittently but reports things are not improving.  Nobody else in his home is sick.  He did receive a flu shot 1 month ago.  He has had no nausea or vomiting.  His stools have been loose.  During his last evaluation with back pain he did have a CT that showed no acute findings and labs were reassuring at that time except for mild elevation of his creatinine.  The history is provided by the patient, the spouse and medical records.  Back Pain Associated symptoms: weakness   Weakness     Past Medical  History:  Diagnosis Date   Acute bronchitis with COPD ([place]) 09/10/2016   Anemia    Atrial fibrillation or flutter    maintaining sinus on amiodarone     Bladder cancer ([place]) dx'd 05/2012   BPH (benign prostatic hyperplasia)    CAD (coronary artery disease)     a. s/p anterior MI 12/05 c/b shock -> stent LAD   b. s/p stenting OM-1, 2/06   CHF (congestive heart failure) ([place])    due to ischemic CM  a. EF 20-30%. (Nov 2008)   b. s/p St. Jude BiV-ICD    c. 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/16/2013   Major depression, single episode, in complete remission ([place]) 08/16/2016   Neuromuscular disorder (  [place])    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"   no neurologist   Osteoarthritis 03/08/2011   Skin cancer    basal cells   facial x 4, 1 right forearm   Sleep apnea    STOP BANG SCORE 4    Patient Active Problem List   Diagnosis Date Noted   Retained myringotomy tube in left ear 07/16/2021   Shoulder joint pain 07/16/2021   Allergic rhinitis 03/06/2020   Neck pain 03/06/2020   Visual disturbance 08/30/2019   Ingrown nail 01/11/2019   Encounter for screening for other disorder 08/27/2018   Other specified abnormal findings of blood chemistry 08/27/2018   Cough 06/11/2018   Presence of cardiac and vascular implant and graft, unspecified 12/22/2017   Sepsis due to Enterococcus ([place]) 12/22/2017   Ventricular tachycardia 12/22/2017   Enterococcal bacteremia 12/08/2017   Nausea & vomiting 12/06/2017   Renal  insufficiency 12/06/2017   Low back pain 08/21/2017   Abnormal weight loss 07/29/2017   Abnormal feces 12/16/2016   Acute respiratory failure with hypoxia ([place]) 09/10/2016   Acute bronchitis with COPD ([place]) 09/10/2016   Major depression, single episode, in complete remission ([place]) 08/16/2016   Heart failure ([place]) 08/01/2015   Hypertensive heart and chronic kidney disease with heart failure and stage 1 through stage 4 chronic kidney disease, or unspecified chronic kidney disease ([place]) 08/01/2015   Non-thrombocytopenic purpura ([place]) 08/01/2015   CKD (chronic kidney disease) stage 4, GFR 15-29 ml/min (HCC) 04/04/2015   Cardiac pacemaker in situ 03/24/2014   Fatigue 03/24/2014   Left ventricular lead failure to capture on the ring electrode 12/16/2013   Adult failure to thrive syndrome 08/20/2013   Thrombocytopenia ([place]) 08/20/2013   Urosepsis 08/16/2013   Anemia 05/21/2013   Basal cell carcinoma of skin 05/21/2013   Hyperglycemia 05/21/2013   Incontinence of feces 05/21/2013   Old myocardial infarction 05/21/2013   Tremor 05/21/2013   Bruising 08/12/2012   Bladder cancer ([place]) 07/08/2012   UTI (urinary tract infection) 05/27/2012   Bladder tumor 05/18/2012   Knee pain, acute, right 02/11/2012   Osteoarthritis 03/08/2011   CAROTID ARTERY DISEASE 12/06/2010   Peripheral vascular disease ([place]) 09/04/2010   Essential hypertension, benign 10/16/7251   Chronic systolic CHF (congestive heart failure) (HCC)-EF 25% 09/14/2009   CHEST PAIN 08/08/2009   Hypothyroidism 06/26/2009   Mixed hyperlipidemia 05/25/2009   Gastro-esophageal reflux disease without esophagitis 04/24/2009   Insomnia 04/24/2009   Polyneuropathy 04/24/2009   CAD, NATIVE VESSEL 02/01/2009   Ischemic cardiomyopathy 01/24/2009   Atrial fibrillation ([place]) 01/21/2009   ICD (implantable cardioverter-defibrillator) in place (St Jude pacer/ICD) 01/21/2009    Past Surgical History:  Procedure Laterality Date   BACK SURGERY   1972   lower back   BIV ICD GENERATOR CHANGEOUT N/A 10/02/2020   Procedure: BIV ICD GENERATOR CHANGEOUT;  Surgeon: Deboraha Sprang, MD;  Location: [place] [facility];  Service: Cardiovascular;  Laterality: N/A;   CARDIAC DEFIBRILLATOR PLACEMENT     ICD St Jude; gen change 09-20-13   CERVICAL LAMINECTOMY  1985   COLONOSCOPY     CORONARY ANGIOPLASTY  2005,2006   CYSTOSCOPY W/ RETROGRADES  05/25/2012   Procedure: CYSTOSCOPY WITH RETROGRADE PYELOGRAM;  Surgeon: Molli Hazard, MD;  Location: [facility];  Service: Urology;  Laterality: Bilateral;   CYSTOSCOPY W/ URETERAL STENT PLACEMENT  07/08/2012   Procedure: CYSTOSCOPY WITH STENT REPLACEMENT;  Surgeon: Molli Hazard, MD;  Location: [facility];  Service: Urology;  Laterality: Left;   CYSTOSCOPY W/  URETERAL STENT REMOVAL  07/08/2012   Procedure: CYSTOSCOPY WITH STENT REMOVAL;  Surgeon: Molli Hazard, MD;  Location: [facility];  Service: Urology;  Laterality: Bilateral;   CYSTOSCOPY/RETROGRADE/URETEROSCOPY  07/08/2012   Procedure: CYSTOSCOPY/RETROGRADE/URETEROSCOPY;  Surgeon: Molli Hazard, MD;  Location: [facility];  Service: Urology;  Laterality: Left;   ESOPHAGOGASTRODUODENOSCOPY     HERNIA REPAIR  1989   bilateral   IMPLANTABLE CARDIOVERTER DEFIBRILLATOR (ICD) GENERATOR CHANGE N/A 09/20/2013   Procedure: ICD GENERATOR CHANGE;  Surgeon: Deboraha Sprang, MD;  Location[facility] CATH LAB;  Service: Cardiovascular;  Laterality: N/A;   TEE WITHOUT CARDIOVERSION N/A 12/10/2017   Procedure: TRANSESOPHAGEAL ECHOCARDIOGRAM (TEE);  Surgeon: Jerline Pain, MD;  Location: [facility] ENDOSCOPY;  Service: Cardiovascular;  Laterality: N/A;   TRANSURETHRAL RESECTION OF BLADDER TUMOR  05/25/2012   cold cup biopsy prostate   TRANSURETHRAL RESECTION OF BLADDER TUMOR  07/08/2012   Procedure: TRANSURETHRAL RESECTION OF BLADDER TUMOR (TURBT);  Surgeon: Molli Hazard, MD;  Location: [facility];  Service: Urology;  Laterality: N/A;  CYSTO, TURBT W/ GYRUS, BILATERAL  STENT REMOVAL, LEFT URETEROSCOPY WITH BIOPSY, POSSIBLE LEFT STENT PLACEMENT        Family History  Problem Relation Age of Onset   Coronary artery disease Other    Stroke Other     Social History   Tobacco Use   Smoking status: Former    Types: Cigarettes, Cigars    Quit date: 03/02/2004    Years since quitting: 17.5   Smokeless tobacco: Former    Quit date: 05/20/1999   Tobacco comments:    quit in 2005  Vaping Use   Vaping Use: Never used  Substance Use Topics   Alcohol use: No   Drug use: No    Home Medications Prior to Admission medications   Medication Sig Start Date End Date Taking? Authorizing Provider  acetaminophen (TYLENOL) 500 MG tablet Take 500-1,000 mg by mouth every 6 (six) hours as needed (for pain).   Yes [provider]  apixaban (ELIQUIS) 2.5 MG TABS tablet Take 2.5 mg by mouth 2 (two) times daily. 06/10/14  Yes [provider]  carboxymethylcellulose (REFRESH PLUS) 0.5 % SOLN Place 1 drop into both eyes 3 (three) times daily as needed (dry/irritated eyes.).   Yes [provider]  carvedilol (COREG) 6.25 MG tablet TAKE 1 TABLET BY MOUTH 2 TIMES DAILY WITH A MEAL. Patient taking differently: Take 6.25 mg by mouth 2 (two) times daily. 03/06/21  Yes Bensimhon, Shaune Pascal, MD  cephALEXin (KEFLEX) 500 MG capsule Take 2,000 mg by mouth See admin instructions. Take 2,000 mg by mouth 45 minutes before dental procedures   Yes [provider]  diazepam (VALIUM) 2 MG tablet Take 1 tablet (2 mg total) by mouth every 8 (eight) hours as needed for muscle spasms. 09/03/21  Yes Alfhild Partch, Loree Fee, MD  ezetimibe (ZETIA) 10 MG tablet Take 10 mg by mouth daily with supper.   Yes [provider]  FARXIGA 10 MG TABS tablet TAKE 1 TABLET BY MOUTH DAILY BEFORE BREAKFAST. Patient taking differently: 10 mg daily before breakfast. 08/30/21  Yes Bensimhon, Shaune Pascal, MD  ferrous sulfate 325 (65 FE) MG tablet Take 325 mg by mouth daily with supper.    Yes [provider]  furosemide (LASIX) 20 MG tablet Take 20 mg by mouth 2 (two) times daily.   Yes [provider]  KLOR-CON M10 10 MEQ tablet Take 10 mEq by mouth in the morning.   Yes [provider]  lidocaine (LIDODERM) 5 % Place 1 patch onto the skin daily. 09/03/21  Yes [provider]  Magnesium Hydroxide (MILK OF MAGNESIA PO) Take 20 mLs by mouth daily as needed (for constipation). 04/24/09  Yes [provider]  Multiple Vitamin (MULTIVITAMIN WITH MINERALS) TABS tablet Take 1 tablet by mouth daily with supper. Centrum Silver   Yes [provider]  nitroGLYCERIN (NITROSTAT) 0.4 MG SL tablet DISSOLVE ONE TABLET UNDER THE TONGUE EVERY 5 MINUTES AS NEEDED FOR CHEST PAIN.  DO NOT EXCEED A TOTAL OF 3 DOSES IN 15 MINUTES Patient taking differently: 0.4 mg every 5 (five) minutes as needed for chest pain (AND DO NOT EXCEED 3 DOSES IN 15 MINUTES). 12/26/16  Yes Larey Dresser, MD  ondansetron (ZOFRAN) 4 MG tablet Take 4 mg by mouth 3 (three) times daily as needed for nausea or vomiting. 06/10/18  Yes [provider]  pantoprazole (PROTONIX) 40 MG tablet Take 40 mg by mouth every evening. 01/15/11  Yes [provider]  rosuvastatin (CRESTOR) 20 MG tablet Take 20 mg by mouth every evening.   Yes [provider]  sodium chloride (OCEAN) 0.65 % SOLN nasal spray Place 1 spray into both nostrils as needed for congestion.   Yes [provider]  SYNTHROID 75 MCG tablet Take 75 mcg by mouth daily before breakfast.   Yes [provider]  tamsulosin (FLOMAX) 0.4 MG CAPS capsule Take 0.4 mg by mouth at bedtime.  11/23/13  Yes [provider]  triamcinolone (NASACORT) 55 MCG/ACT AERO nasal inhaler USE 2 SPRAYS IN EACH NOSTRIL ONCE DAILY AT NIGHT Patient taking differently: 2 sprays daily as needed (for allergies or rhinitis). 02/26/21  Yes Rozetta Nunnery, MD  hydrOXYzine (ATARAX/VISTARIL) 25 MG tablet  Take 1 tablet by mouth at bedtime as needed. Patient not taking: Reported on 09/07/2021 12/21/19   [provider]  levothyroxine (SYNTHROID) 75 MCG tablet Take 1 tablet (75 mcg total) by mouth daily before breakfast. Patient not taking: Reported on 09/07/2021 09/04/21   Deboraha Sprang, MD    Allergies    Gabapentin, Ramipril, Amoxicillin, Penicillin g sodium, and Penicillins  Review of Systems   Review of Systems  Musculoskeletal:  Positive for back pain.  Neurological:  Positive for weakness.  All other systems reviewed and are negative.  Physical Exam Updated Vital Signs BP (!) 123/53   Pulse 72   Temp (!) 102.2 F (39 C) (Rectal)   Resp (!) 27   Wt 81.7 kg   SpO2 94%   BMI 25.84 kg/m   Physical Exam Vitals and nursing note reviewed.  Constitutional:      General: He is not in acute distress.    Appearance: He is well-developed. He is ill-appearing.  HENT:     Head: Normocephalic and atraumatic.     Nose: Nose normal.     Mouth/Throat:     Mouth: Mucous membranes are dry.  Eyes:     Conjunctiva/sclera: Conjunctivae normal.     Pupils: Pupils are equal, round, and reactive to light.  Cardiovascular:     Rate and Rhythm: Normal rate and regular rhythm.     Heart sounds: No murmur heard. Pulmonary:     Effort: Pulmonary effort is normal. No respiratory distress.     Breath sounds: Normal breath sounds. No wheezing or rales.     Comments: Pacemaker present in the left upper chest Chest:     Chest wall: No tenderness.  Abdominal:  General: There is no distension.     Palpations: Abdomen is soft.     Tenderness: There is no abdominal tenderness. There is right CVA tenderness and left CVA tenderness. There is no guarding or rebound.  Musculoskeletal:        General: Tenderness present. Normal range of motion.     Cervical back: Normal range of motion and neck supple.       Back:     Comments: Pain worsened in the back when flexing seems to improve when  extending or lying flat  Skin:    General: Skin is warm and dry.     Findings: No erythema or rash.     Comments: Hot to the touch  Neurological:     Mental Status: He is alert and oriented to person, place, and time.     Comments: 4.5-5/ 5 strength in bilateral lower extremities.  No pronator drift noted.  Sensation intact.  Psychiatric:        Mood and Affect: Mood normal.        Behavior: Behavior normal.    ED Results / Procedures / Treatments   Labs (all labs ordered are listed, but only abnormal results are displayed) Labs Reviewed  RESP PANEL BY RT-PCR (FLU A&B, COVID) ARPGX2 - Abnormal; Notable for the following components:      Result Value   Influenza A by PCR POSITIVE (*)    All other components within normal limits  CBC WITH DIFFERENTIAL/PLATELET - Abnormal; Notable for the following components:   Lymphs Abs 0.6 (*)    All other components within normal limits  COMPREHENSIVE METABOLIC PANEL - Abnormal; Notable for the following components:   Glucose, Bld 130 (*)    BUN 31 (*)    Creatinine, Ser 2.68 (*)    GFR, Estimated 22 (*)    All other components within normal limits  URINALYSIS, ROUTINE W REFLEX MICROSCOPIC - Abnormal; Notable for the following components:   Glucose, UA >=500 (*)    Hgb urine dipstick MODERATE (*)    All other components within normal limits  LACTIC ACID, PLASMA - Abnormal; Notable for the following components:   Lactic Acid, Venous 2.0 (*)    All other components within normal limits  BRAIN NATRIURETIC PEPTIDE - Abnormal; Notable for the following components:   B Natriuretic Peptide 431.9 (*)    All other components within normal limits  URINE CULTURE  LACTIC ACID, PLASMA    EKG None  Radiology DG Chest 2 View  Result Date: 09/07/2021 CLINICAL DATA:  Cough with congestion, wheezing and fever. EXAM: CHEST - 2 VIEW COMPARISON:  Radiographs 10/12/2017.  CT 05/06/2005. FINDINGS: The lateral view is limited by a metallic structure  overlying the thoracic spine. Left subclavian biventricular AICD leads appear unchanged. The heart size and mediastinal contours are stable. There is mild chronic vascular congestion, but no evidence of edema, confluent airspace opacity, pleural effusion or pneumothorax. No acute osseous findings are evident with limited assessment of the spine due to artifact. IMPRESSION: Stable mild cardiomegaly and chronic vascular congestion. No acute cardiopulmonary process identified. Electronically Signed   By: Richardean Sale M.D.   On: 09/07/2021 16:42    Procedures Procedures   Medications Ordered in ED Medications  oseltamivir (TAMIFLU) capsule 30 mg (has no administration in time range)  benzonatate (TESSALON) capsule 200 mg (has no administration in time range)  acetaminophen (TYLENOL) tablet 1,000 mg (1,000 mg Oral Given 09/07/21 1700)  lactated ringers bolus 500 mL (  500 mLs Intravenous New Bag/Given 09/07/21 1700)    ED Course  I have reviewed the triage vital signs and the nursing notes.  Pertinent labs & imaging results that were available during my care of the patient were reviewed by me and considered in my medical decision making (see chart for details).    MDM Rules/Calculators/A&P                           Elderly male returning today to the emergency room due to persistent back pain and now additional weakness, anorexia and cough which was productive but is now dry.  Patient is denying any shortness of breath.  On exam today patient is febrile to 102.2.  He still is having pain in his mid and lower back.  Also complaining of dysuria.  Concern for possible flu versus pneumonia versus UTI versus acute spinal pathology.  Because of the patient's pacemaker he is unable to have an MRI at this time.  Because of his chronic renal condition he cannot have a CT with contrast.  Patient was given IV fluids as he has had poor oral intake and Tylenol for fever.  Lactic acid today is elevated at 2.0.   Flu and COVID are pending.  Chest x-ray without evidence of pneumonia.  UA also pending.  Given patient's worsening weakness, fever and general disability feel that he will need admission for further care.  Waiting on further information before providing antibiotics.  6:14 PM Patient's labs are baseline without evidence of UTI, CBC within normal limits, CMP with stable chronic kidney disease with creatinine of 2.68 today from 2.79 earlier this week.  Patient is influenza A positive today.  Feel that that is the cause of his fever and symptoms.  Suspect the back pain is musculoskeletal and ongoing but lower suspicion for epidural abscess, discitis or osteomyelitis at this time.  However patient may need an MRI in the future but with his pacemaker will need to see if this is compatible.  Because of patient's recurrent falls, poor appetite will admit for further care of flu symptoms, fluids and pain control.  MDM   Amount and/or Complexity of Data Reviewed Clinical lab tests: ordered and reviewed Tests in the radiology section of CPT: ordered and reviewed Tests in the medicine section of CPT: ordered and reviewed Independent visualization of images, tracings, or specimens: yes     Final Clinical Impression(s) / ED Diagnoses Final diagnoses:  Influenza A  Weakness  Fall, initial encounter  Bilateral thoracic back pain, unspecified chronicity    Rx / DC Orders ED Discharge Orders     None        Blanchie Dessert, MD 09/07/21 1816

## 2021-09-07 NOTE — ED Triage Notes (Signed)
Pt from home with ems c.o back pain for the past 3 weeks, denies trauma. Family reports he has been weaker, hx of bladder cancer.   Bp 134/62 HR 78 94%

## 2021-09-08 ENCOUNTER — Observation Stay (HOSPITAL_COMMUNITY): Payer: Medicare Other

## 2021-09-08 DIAGNOSIS — R140 Abdominal distension (gaseous): Secondary | ICD-10-CM

## 2021-09-08 DIAGNOSIS — N179 Acute kidney failure, unspecified: Secondary | ICD-10-CM

## 2021-09-08 DIAGNOSIS — Z20822 Contact with and (suspected) exposure to covid-19: Secondary | ICD-10-CM | POA: Diagnosis present

## 2021-09-08 DIAGNOSIS — I255 Ischemic cardiomyopathy: Secondary | ICD-10-CM | POA: Diagnosis present

## 2021-09-08 DIAGNOSIS — Z880 Allergy status to penicillin: Secondary | ICD-10-CM | POA: Diagnosis not present

## 2021-09-08 DIAGNOSIS — R3129 Other microscopic hematuria: Secondary | ICD-10-CM | POA: Diagnosis present

## 2021-09-08 DIAGNOSIS — E860 Dehydration: Secondary | ICD-10-CM | POA: Diagnosis present

## 2021-09-08 DIAGNOSIS — N184 Chronic kidney disease, stage 4 (severe): Secondary | ICD-10-CM | POA: Diagnosis present

## 2021-09-08 DIAGNOSIS — I5022 Chronic systolic (congestive) heart failure: Secondary | ICD-10-CM | POA: Diagnosis present

## 2021-09-08 DIAGNOSIS — Z95810 Presence of automatic (implantable) cardiac defibrillator: Secondary | ICD-10-CM | POA: Diagnosis not present

## 2021-09-08 DIAGNOSIS — E782 Mixed hyperlipidemia: Secondary | ICD-10-CM | POA: Diagnosis present

## 2021-09-08 DIAGNOSIS — I2510 Atherosclerotic heart disease of native coronary artery without angina pectoris: Secondary | ICD-10-CM | POA: Diagnosis present

## 2021-09-08 DIAGNOSIS — I959 Hypotension, unspecified: Secondary | ICD-10-CM | POA: Diagnosis present

## 2021-09-08 DIAGNOSIS — E876 Hypokalemia: Secondary | ICD-10-CM | POA: Diagnosis present

## 2021-09-08 DIAGNOSIS — E039 Hypothyroidism, unspecified: Secondary | ICD-10-CM | POA: Diagnosis present

## 2021-09-08 DIAGNOSIS — I480 Paroxysmal atrial fibrillation: Secondary | ICD-10-CM | POA: Diagnosis present

## 2021-09-08 DIAGNOSIS — Z888 Allergy status to other drugs, medicaments and biological substances status: Secondary | ICD-10-CM | POA: Diagnosis not present

## 2021-09-08 DIAGNOSIS — D631 Anemia in chronic kidney disease: Secondary | ICD-10-CM | POA: Diagnosis present

## 2021-09-08 DIAGNOSIS — I252 Old myocardial infarction: Secondary | ICD-10-CM | POA: Diagnosis not present

## 2021-09-08 DIAGNOSIS — K219 Gastro-esophageal reflux disease without esophagitis: Secondary | ICD-10-CM | POA: Diagnosis present

## 2021-09-08 DIAGNOSIS — Z955 Presence of coronary angioplasty implant and graft: Secondary | ICD-10-CM | POA: Diagnosis not present

## 2021-09-08 DIAGNOSIS — J101 Influenza due to other identified influenza virus with other respiratory manifestations: Secondary | ICD-10-CM | POA: Diagnosis present

## 2021-09-08 DIAGNOSIS — Z8551 Personal history of malignant neoplasm of bladder: Secondary | ICD-10-CM | POA: Diagnosis not present

## 2021-09-08 DIAGNOSIS — I130 Hypertensive heart and chronic kidney disease with heart failure and stage 1 through stage 4 chronic kidney disease, or unspecified chronic kidney disease: Secondary | ICD-10-CM | POA: Diagnosis present

## 2021-09-08 DIAGNOSIS — R531 Weakness: Secondary | ICD-10-CM | POA: Diagnosis not present

## 2021-09-08 DIAGNOSIS — N400 Enlarged prostate without lower urinary tract symptoms: Secondary | ICD-10-CM | POA: Diagnosis present

## 2021-09-08 LAB — CBC
HCT: 37 % — ABNORMAL LOW (ref 39.0–52.0)
Hemoglobin: 11.7 g/dL — ABNORMAL LOW (ref 13.0–17.0)
MCH: 29.3 pg (ref 26.0–34.0)
MCHC: 31.6 g/dL (ref 30.0–36.0)
MCV: 92.5 fL (ref 80.0–100.0)
Platelets: 173 10*3/uL (ref 150–400)
RBC: 4 MIL/uL — ABNORMAL LOW (ref 4.22–5.81)
RDW: 13.5 % (ref 11.5–15.5)
WBC: 4.4 10*3/uL (ref 4.0–10.5)
nRBC: 0 % (ref 0.0–0.2)

## 2021-09-08 LAB — URINE CULTURE

## 2021-09-08 LAB — BASIC METABOLIC PANEL
Anion gap: 6 (ref 5–15)
BUN: 27 mg/dL — ABNORMAL HIGH (ref 8–23)
CO2: 27 mmol/L (ref 22–32)
Calcium: 8.4 mg/dL — ABNORMAL LOW (ref 8.9–10.3)
Chloride: 103 mmol/L (ref 98–111)
Creatinine, Ser: 2.48 mg/dL — ABNORMAL HIGH (ref 0.61–1.24)
GFR, Estimated: 24 mL/min — ABNORMAL LOW (ref >60)
Glucose, Bld: 99 mg/dL (ref 70–99)
Potassium: 3.5 mmol/L (ref 3.5–5.1)
Sodium: 136 mmol/L (ref 135–145)

## 2021-09-08 MED ORDER — CARVEDILOL 3.125 MG PO TABS
3.1250 mg | ORAL_TABLET | Freq: Two times a day (BID) | ORAL | Status: DC
  Administered 2021-09-09: 09:00:00 3.125 mg via ORAL
  Filled 2021-09-08 (×3): qty 1

## 2021-09-08 MED ORDER — ACETAMINOPHEN 500 MG PO TABS
500.0000 mg | ORAL_TABLET | Freq: Three times a day (TID) | ORAL | Status: DC
  Administered 2021-09-08 – 2021-09-09 (×3): 500 mg via ORAL
  Filled 2021-09-08 (×3): qty 1

## 2021-09-08 MED ORDER — LACTATED RINGERS IV SOLN: INTRAVENOUS | Status: AC

## 2021-09-08 NOTE — Progress Notes (Addendum)
PROGRESS NOTE   [name]  DVV:[id number]    DOB: 1932/06/19    DOA: 09/07/2021  PCP: [name], MD   I have briefly reviewed patients previous medical records in [facility].  Chief Complaint  Patient presents with   Back Pain   Weakness    Brief Narrative:  87 y.o. male with medical history significant for HFrEF (EF 20-25%) s/p BiV ICD, CAD s/p multivessel stenting, PAF on Eliquis, CKD stage IV, HTN, HLD, hypothyroidism, BPH who presents to the ED 11/25 for evaluation of cough and weakness to a point where he was unable to get out of bed, dropped to the ground when family was assisting him and was unable to get him back up.  Seen in ED 11/21 for bilateral thoracic back pain, CT abdomen/pelvis without contrast was negative for acute abnormality and patient discharged home.  Admitted for influenza A acute bronchitis with associated dehydration, generalized weakness and inability to walk.   Assessment & Plan:  Principal Problem:   Influenza A Active Problems:   Mixed hyperlipidemia   CAD, NATIVE VESSEL   Atrial fibrillation (HCC)   Chronic systolic CHF (congestive heart failure) (HCC)-EF 25%   CKD (chronic kidney disease) stage 4, GFR 15-29 ml/min (HCC)   Generalized weakness   Hypothyroidism   Influenza A acute bronchitis: Presenting with fever, generalized weakness, cough, inability to maintain adequate oral intake or ambulate even with the use of his walker.  CXR without evidence of pneumonia. -Started on renal dosed Tamiflu 30 mg daily -Gentle IV fluid hydration overnight -Continue supportive care.  Clinically improved.  Defervesced.  No leukocytosis and not hypoxic.  Dehydration/Hypotension BP 91/49 just now. Resume gentle IVF x10 hours.  Carvedilol dose has already been reduced and holding parameters in place.  Encourage p.o. intake.   Generalized weakness/back pain: Generalized weakness secondary to influenza A.  Previously reported back pain likely  muscle spasm, this has resolved on admission but has some recurrence of same pain. - CT abdomen and pelvis 11/21: Bones are diffusely undermineralized.  There is multilevel degenerative disc disease but no acute or suspicious osseous abnormality.  Wonder if his back pain is related to degenerative disc disease.  Trial of scheduled Tylenol, could consider lidocaine patch. -Per PT evaluation, ambulated 350 feet x 2 with rolling walker.   HFrEF s/p BiV ICD: Last EF 20-25%.  Appeared volume depleted on admission. -Holding Lasix, on gentle IV fluid hydration overnight -Monitor strict I/O's and daily weights -Continue Coreg, dose reduced from 6.25-3.125 Mg twice daily due to soft blood pressures with SBP in the low 100s this morning. -Hold Farxiga for now -Not on ACE/ARB/spironolactone due to renal dysfunction -Still appears on the slightly dry side but given history of CHF, increased oral intake instead of IV fluids.   Paroxysmal atrial fibrillation: Stable with controlled rate.  Continue Coreg (reduced dose) and Eliquis.   AKI on CKD stage IV: Baseline creatinine may be in the 2.3-2.5 range.  Presented with creatinine of 2.79 which is improved to 2.48 after holding some meds and gentle IV fluids..   CAD: Stable, denies any chest pain.  Not on aspirin as he is on Eliquis.  Continue rosuvastatin and Zetia.   Hypothyroidism: Continue Synthroid.     Hyperlipidemia: Rosuvastatin and Zetia.  BPH: Continue Flomax.  Anemia: Suspect dilutional.  No bleeding reported.  Follow CBC in a.m.  Abdominal discomfort/?  Distention - Recent CT abdomen and pelvis without acute findings.  Will obtain KUB.  Body mass index is 25.21 kg/m.    DVT prophylaxis: apixaban (ELIQUIS) tablet 2.5 mg Start: 09/07/21 2200     Code Status: Full Code Family Communication: I discussed with patient's spouse via phone, updated care. Disposition:  Status is: Observation  The patient will require care spanning >  2 midnights and should be moved to inpatient because: Soft blood pressures, titrating meds, some back and abdominal discomfort, evaluating with KUB.       Consultants:   None  Procedures:   None  Antimicrobials:      Subjective:  Reports feeling stronger.  Tells me that he walked in the hallway with PT.  States that he has mild mid back pain and some abdominal gaseous distention and this to but separate discomforts.  Had a BM this morning.  Denies abdominal pain, nausea or vomiting.  Had his breakfast this morning.  Objective:   Vitals:   09/07/21 2100 09/08/21 0342 09/08/21 0813 09/08/21 1157  BP: (!) 101/47 (!) 96/43 (!) 103/46 (!) 112/100  Pulse: 63 63 65 69  Resp: 18 19 20  (!) 21  Temp: 98.2 F (36.8 C) 98.4 F (36.9 C) 98.4 F (36.9 C) 97.7 F (36.5 C)  TempSrc: Oral Oral Oral Oral  SpO2: 95% 96% 94% 100%  Weight: 76.9 kg 79.7 kg      General exam: Elderly male, moderately built, frail, sitting up comfortably in chair with no obvious distress.  Oral mucosa with borderline hydration. Respiratory system: Clear to auscultation. Respiratory effort normal. Cardiovascular system: S1 & S2 heard, RRR. No JVD, murmurs, rubs, gallops or clicks. No pedal edema. Gastrointestinal system: Abdomen appears somewhat protuberant/distended but soft and nontender.  No organomegaly or masses appreciated.  Normal bowel sounds heard. Central nervous system: Alert and oriented. No focal neurological deficits. Extremities: Symmetric 5 x 5 power. Skin: No rashes, lesions or ulcers Psychiatry: Judgement and insight appear normal. Mood & affect appropriate.     Data Reviewed:   I have personally reviewed following labs and imaging studies   CBC: Recent Labs  Lab 09/03/21 1513 09/07/21 1450 09/08/21 0252  WBC 9.1 7.6 4.4  NEUTROABS 5.8 6.1  --   HGB 15.0 14.3 11.7*  HCT 48.1 45.2 37.0*  MCV 93.2 94.0 92.5  PLT 247 214 237    Basic Metabolic Panel: Recent Labs  Lab  09/03/21 1513 09/07/21 1450 09/08/21 0252  NA 140 140 136  K 4.1 4.7 3.5  CL 101 103 103  CO2 26 27 27   GLUCOSE 119* 130* 99  BUN 30* 31* 27*  CREATININE 2.79* 2.68* 2.48*  CALCIUM 9.3 9.2 8.4*    Liver Function Tests: Recent Labs  Lab 09/03/21 1513 09/07/21 1450  AST 21 28  ALT 9 13  ALKPHOS 83 89  BILITOT 1.3* 1.0  PROT 7.7 7.3  ALBUMIN 3.7 3.6    CBG: No results for input(s): GLUCAP in the last 168 hours.  Microbiology Studies:   Recent Results (from the past 240 hour(s))  Resp Panel by RT-PCR (Flu A&B, Covid) Nasopharyngeal Swab     Status: Abnormal   Collection Time: 09/07/21  2:51 PM   Specimen: Nasopharyngeal Swab; Nasopharyngeal(NP) swabs in vial transport medium  Result Value Ref Range Status   SARS Coronavirus 2 by RT PCR NEGATIVE NEGATIVE Final    Comment: (NOTE) SARS-CoV-2 target nucleic acids are NOT DETECTED.  The SARS-CoV-2 RNA is generally detectable in upper respiratory specimens during the acute phase of infection. The lowest concentration of SARS-CoV-2 viral  copies this assay can detect is 138 copies/mL. A negative result does not preclude SARS-Cov-2 infection and should not be used as the sole basis for treatment or other patient management decisions. A negative result may occur with  improper specimen collection/handling, submission of specimen other than nasopharyngeal swab, presence of viral mutation(s) within the areas targeted by this assay, and inadequate number of viral copies(<138 copies/mL). A negative result must be combined with clinical observations, patient history, and epidemiological information. The expected result is Negative.  Fact Sheet for Patients:  EntrepreneurPulse.com.au  Fact Sheet for Healthcare Providers:  IncredibleEmployment.be  This test is no t yet approved or cleared by the [place] FDA and  has been authorized for detection and/or diagnosis of SARS-CoV-2 by FDA  under an Emergency Use Authorization (EUA). This EUA will remain  in effect (meaning this test can be used) for the duration of the COVID-19 declaration under Section 564(b)(1) of the Act, 21 U.S.C.section 360bbb-3(b)(1), unless the authorization is terminated  or revoked sooner.       Influenza A by PCR POSITIVE (A) NEGATIVE Final   Influenza B by PCR NEGATIVE NEGATIVE Final    Comment: (NOTE) The Xpert Xpress SARS-CoV-2/FLU/RSV plus assay is intended as an aid in the diagnosis of influenza from Nasopharyngeal swab specimens and should not be used as a sole basis for treatment. Nasal washings and aspirates are unacceptable for Xpert Xpress SARS-CoV-2/FLU/RSV testing.  Fact Sheet for Patients: EntrepreneurPulse.com.au  Fact Sheet for Healthcare Providers: IncredibleEmployment.be  This test is not yet approved or cleared by the [place] FDA and has been authorized for detection and/or diagnosis of SARS-CoV-2 by FDA under an Emergency Use Authorization (EUA). This EUA will remain in effect (meaning this test can be used) for the duration of the COVID-19 declaration under Section 564(b)(1) of the Act, 21 U.S.C. section 360bbb-3(b)(1), unless the authorization is terminated or revoked.  Performed at [facility], [place] [address] [place], [place] [address]     Radiology Studies:  DG Chest 2 View  Result Date: 09/07/2021 CLINICAL DATA:  Cough with congestion, wheezing and fever. EXAM: CHEST - 2 VIEW COMPARISON:  Radiographs 10/12/2017.  CT 05/06/2005. FINDINGS: The lateral view is limited by a metallic structure overlying the thoracic spine. Left subclavian biventricular AICD leads appear unchanged. The heart size and mediastinal contours are stable. There is mild chronic vascular congestion, but no evidence of edema, confluent airspace opacity, pleural effusion or pneumothorax. No acute osseous findings are evident with limited  assessment of the spine due to artifact. IMPRESSION: Stable mild cardiomegaly and chronic vascular congestion. No acute cardiopulmonary process identified. Electronically Signed   By: Richardean Sale M.D.   On: 09/07/2021 16:42    Scheduled Meds:    apixaban  2.5 mg Oral BID   carvedilol  3.125 mg Oral BID WC   ezetimibe  10 mg Oral Q supper   guaiFENesin  600 mg Oral BID   levothyroxine  75 mcg Oral QAC breakfast   oseltamivir  30 mg Oral Daily   pantoprazole  40 mg Oral QPM   rosuvastatin  20 mg Oral QPM   sodium chloride flush  3 mL Intravenous Q12H   tamsulosin  0.4 mg Oral QHS    Continuous Infusions:     LOS: 0 days     Vernell Leep, MD,  FACP, [facility], [facility], [facility] (Care Management Physician Certified) [place]  To contact the attending provider between 7A-7P or  the covering provider during after hours 7P-7A, please log into the web site [email or web address] and access using universal [place] password for that web site. If you do not have the password, please call the hospital operator.  09/08/2021, 12:38 PM

## 2021-09-08 NOTE — Evaluation (Signed)
Physical Therapy Evaluation Patient Details Name: [name] MRN: [id number] DOB: October 07, 1932 Today's Date: 09/08/2021  History of Present Illness  Pt is an 87 y.o. male who presented 09/07/21 with cough and weakness. Pt found to have influenza A. PMH: HFrEF (EF 20-25%) s/p BiV ICD, CAD s/p multivessel stenting, PAF on Eliquis, CKD stage IV, HTN, HLD, hypothyroidism, BPH   Clinical Impression  Pt presents with condition above and deficits mentioned below, see PT Problem List. PTA, he was independent, mobilizing without an AD, living with his wife in a house with 4 STE and a workshop in the basement. Currently, pt displays deficits in lower extremity strength (specifically in hip flexors noted functionally), balance, and activity tolerance. Pt is requiring use of a RW for stability with ambulation at this time. In addition, pt is requiring minA for bed mobility due to his gross weakness and pain with mobility, but otherwise is mobilizing at a min guard assist level. Educated pt to use his RW at home and have guarding by his wife for mobility at d/c. Pt would benefit from follow-up with HHPT to maximize his independence and safety with all functional mobility. Will continue to follow acutely.       Recommendations for follow up therapy are one component of a multi-disciplinary discharge planning process, led by the attending physician.  Recommendations may be updated based on patient status, additional functional criteria and insurance authorization.  Follow Up Recommendations Home health PT    Assistance Recommended at Discharge Intermittent Supervision/Assistance  Functional Status Assessment Patient has had a recent decline in their functional status and demonstrates the ability to make significant improvements in function in a reasonable and predictable amount of time.  Equipment Recommendations  None recommended by PT    Recommendations for Other Services       Precautions /  Restrictions Precautions Precautions: Fall Precaution Comments: Droplet precautions Restrictions Weight Bearing Restrictions: No      Mobility  Bed Mobility Overal bed mobility: Needs Assistance Bed Mobility: Supine to Sit     Supine to sit: Min assist;HOB elevated     General bed mobility comments: Pt able to initiate transition to sit with min guard but required minA to complete trunk ascension to sit R EOB.    Transfers Overall transfer level: Needs assistance Equipment used: Rolling walker (2 wheels) Transfers: Sit to/from Stand Sit to Stand: Min guard           General transfer comment: Min guard for safety, extra time to power up to stand.    Ambulation/Gait Ambulation/Gait assistance: Min guard Gait Distance (Feet): 350 Feet (x2 bouts of ~350 ft > ~40 ft) Assistive device: Rolling walker (2 wheels) Gait Pattern/deviations: Step-through pattern;Decreased stride length;Shuffle;Trunk flexed Gait velocity: reduced Gait velocity interpretation: <1.31 ft/sec, indicative of household ambulator   General Gait Details: Pt with slow, shuffling gait, needing cues to improve speed and step length. No LOB, min guard for safety. Cues provided to remain within RW.  Stairs            Wheelchair Mobility    Modified Rankin (Stroke Patients Only)       Balance Overall balance assessment: Needs assistance Sitting-balance support: No upper extremity supported;Feet supported Sitting balance-Leahy Scale: Good     Standing balance support: Reliant on assistive device for balance Standing balance-Leahy Scale: Poor Standing balance comment: Reliant on RW  Pertinent Vitals/Pain Pain Assessment: Faces Faces Pain Scale: Hurts little more Pain Location: low back and chest with coughing Pain Descriptors / Indicators: Discomfort;Grimacing;Guarding Pain Intervention(s): Limited activity within patient's tolerance;Monitored during  session;Repositioned    Home Living Family/patient expects to be discharged to:: Private residence Living Arrangements: Spouse/significant other Available Help at Discharge: Family;Available 24 hours/day Type of Home: House Home Access: Stairs to enter Entrance Stairs-Rails: Right (ascending) Entrance Stairs-Number of Steps: 4 Alternate Level Stairs-Number of Steps: 13 (to basement workshop) Home Layout: Two level;Able to live on main level with bedroom/bathroom;Laundry or work area in [place]: Conservation officer, nature (2 wheels);Cane - single point;BSC/3in1;Shower seat;Grab bars - tub/shower      Prior Function Prior Level of Function : Independent/Modified Independent;Driving;History of Falls (last six months)             Mobility Comments: Pt reports normally does not use AD for mobility.       Hand Dominance        Extremity/Trunk Assessment   Upper Extremity Assessment Upper Extremity Assessment: Defer to OT evaluation    Lower Extremity Assessment Lower Extremity Assessment: Generalized weakness    Cervical / Trunk Assessment Cervical / Trunk Assessment: Kyphotic  Communication   Communication: No difficulties  Cognition Arousal/Alertness: Awake/alert Behavior During Therapy: WFL for tasks assessed/performed Overall Cognitive Status: Within Functional Limits for tasks assessed                                          General Comments General comments (skin integrity, edema, etc.): VSS on RA    Exercises     Assessment/Plan    PT Assessment Patient needs continued PT services  PT Problem List Decreased strength;Decreased activity tolerance;Decreased balance;Decreased mobility;Decreased knowledge of use of DME;Cardiopulmonary status limiting activity       PT Treatment Interventions DME instruction;Gait training;Functional mobility training;Stair training;Therapeutic exercise;Therapeutic activities;Balance  training;Neuromuscular re-education;Patient/family education    PT Goals (Current goals can be found in the Care Plan section)  Acute Rehab PT Goals Patient Stated Goal: to go home soon PT Goal Formulation: With patient Time For Goal Achievement: 09/22/21 Potential to Achieve Goals: Good    Frequency Min 3X/week   Barriers to discharge        Co-evaluation               AM-PAC PT "6 Clicks" Mobility  Outcome Measure Help needed turning from your back to your side while in a flat bed without using bedrails?: A Little Help needed moving from lying on your back to sitting on the side of a flat bed without using bedrails?: A Little Help needed moving to and from a bed to a chair (including a wheelchair)?: A Little Help needed standing up from a chair using your arms (e.g., wheelchair or bedside chair)?: A Little Help needed to walk in hospital room?: A Little Help needed climbing 3-5 steps with a railing? : A Little 6 Click Score: 18    End of Session Equipment Utilized During Treatment: Gait belt Activity Tolerance: Patient tolerated treatment well Patient left: in chair;with call bell/phone within reach Nurse Communication: Mobility status PT Visit Diagnosis: Unsteadiness on feet (R26.81);Other abnormalities of gait and mobility (R26.89);Muscle weakness (generalized) (M62.81);History of falling (Z91.81);Difficulty in walking, not elsewhere classified (R26.2)    Time: 2440-1027 PT Time Calculation (min) (ACUTE ONLY): 39 min   Charges:   PT  Evaluation $PT Eval Moderate Complexity: 1 Mod PT Treatments $Gait Training: 8-22 mins $Therapeutic Activity: 8-22 mins        Moishe Spice, PT, DPT Acute Rehabilitation Services  Pager: [phone] Office: [phone]   Orvan Falconer 09/08/2021, 11:48 AM

## 2021-09-08 NOTE — Evaluation (Signed)
Occupational Therapy Evaluation Patient Details Name: [name] MRN: [id number] DOB: May 03, 1932 Today's Date: 09/08/2021   History of Present Illness Pt is an 87 y.o. male who presented 09/07/21 with cough and weakness. Pt found to have influenza A. PMH: HFrEF (EF 20-25%) s/p BiV ICD, CAD s/p multivessel stenting, PAF on Eliquis, CKD stage IV, HTN, HLD, hypothyroidism, BPH   Clinical Impression   Patient admitted for the diagnosis above.  PTA he lives with his spouse, using RW more often due to low back pain over the last two weeks.  In addition, spouse has been assisting him with lower body dressing secondary to low back pain.  Patient may benefit from hip kit education.  Deficits impacting independence are listed below.  OT will follow in the acute setting, but given the level of support at home, home with [facility] OT is recommended.       Recommendations for follow up therapy are one component of a multi-disciplinary discharge planning process, led by the attending physician.  Recommendations may be updated based on patient status, additional functional criteria and insurance authorization.   Follow Up Recommendations  Home health OT    Assistance Recommended at Discharge Set up Supervision/Assistance  Functional Status Assessment  Patient has had a recent decline in their functional status and demonstrates the ability to make significant improvements in function in a reasonable and predictable amount of time.  Equipment Recommendations  Tub/shower seat    Recommendations for Other Services       Precautions / Restrictions Precautions Precautions: Fall Precaution Comments: Droplet precautions Restrictions Weight Bearing Restrictions: No      Mobility Bed Mobility Overal bed mobility: Needs Assistance Bed Mobility: Supine to Sit;Sit to Supine     Supine to sit: Min assist;HOB elevated Sit to supine: Min assist;HOB elevated   General bed mobility comments: assist with  feet back onto the bed.    Transfers Overall transfer level: Needs assistance Equipment used: Rolling walker (2 wheels) Transfers: Sit to/from Stand Sit to Stand: Min guard                  Balance Overall balance assessment: Needs assistance Sitting-balance support: No upper extremity supported;Feet supported Sitting balance-Leahy Scale: Good   Postural control: Left lateral lean Standing balance support: Reliant on assistive device for balance Standing balance-Leahy Scale: Poor Standing balance comment: Reliant on RW                           ADL either performed or assessed with clinical judgement   ADL       Grooming: Wash/dry hands;Wash/dry face;Set up;Sitting       Lower Body Bathing: Moderate assistance;Sit to/from stand Lower Body Bathing Details (indicate cue type and reason): difficulty reaching feet, especially R foot     Lower Body Dressing: Moderate assistance;Sit to/from stand   Toilet Transfer: Minimal assistance;Ambulation;Rolling walker (2 wheels);Comfort height toilet                   Vision Baseline Vision/History: 1 Wears glasses Patient Visual Report: No change from baseline       Perception Perception Perception: Not tested   Praxis Praxis Praxis: Not tested    Pertinent Vitals/Pain Pain Assessment: Faces Faces Pain Scale: Hurts even more Pain Location: low back Pain Descriptors / Indicators: Discomfort;Grimacing;Guarding Pain Intervention(s): Monitored during session     Hand Dominance Right   Extremity/Trunk Assessment Upper Extremity Assessment Upper Extremity  Assessment: Generalized weakness;LUE deficits/detail LUE Deficits / Details: missing pointer finger to hand LUE Sensation: WNL LUE Coordination: WNL   Lower Extremity Assessment Lower Extremity Assessment: Defer to PT evaluation       Communication Communication Communication: No difficulties   Cognition Arousal/Alertness:  Awake/alert Behavior During Therapy: WFL for tasks assessed/performed Overall Cognitive Status: Within Functional Limits for tasks assessed                                 General Comments: Mild ST Memory deficts noted.     General Comments       Exercises     Shoulder Instructions      Home Living Family/patient expects to be discharged to:: Private residence Living Arrangements: Spouse/significant other Available Help at Discharge: Family;Available 24 hours/day Type of Home: House Home Access: Stairs to enter [employer] of Steps: 4 Entrance Stairs-Rails: Right Home Layout: Two level;Able to live on main level with bedroom/bathroom;Laundry or work area in Building surveyor of Steps: 13 Alternate Level Stairs-Rails: Right Bathroom Shower/Tub: Occupational psychologist: Standard Bathroom Accessibility: Yes How Accessible: Accessible via walker Home Equipment: Conservation officer, nature (2 wheels);Cane - single point;BSC/3in1;Shower seat;Grab bars - tub/shower          Prior Functioning/Environment Prior Level of Function : Independent/Modified Independent;Driving;History of Falls (last six months)             Mobility Comments: Pt reports normally does not use AD for mobility, but increasing back pain over the last two weeks have him using a RW more. ADLs Comments: Spouse has been assisting him with lower body dressing due to back pain over the last two weeks.        OT Problem List: Decreased strength;Decreased activity tolerance;Impaired balance (sitting and/or standing);Pain      OT Treatment/Interventions: Self-care/ADL training;Therapeutic activities;Balance training;Patient/family education;DME and/or AE instruction    OT Goals(Current goals can be found in the care plan section) Acute Rehab OT Goals Patient Stated Goal: Get back home by Monday OT Goal Formulation: With patient Time For Goal Achievement:  09/22/21 Potential to Achieve Goals: Good ADL Goals Pt Will Perform Grooming: with modified independence;standing Pt Will Perform Lower Body Bathing: with min assist;sit to/from stand Pt Will Perform Lower Body Dressing: with min assist;sit to/from stand Pt Will Transfer to Toilet: with modified independence;regular height toilet;ambulating Pt Will Perform Toileting - Clothing Manipulation and hygiene: with modified independence;sit to/from stand  OT Frequency: Min 2X/week   Barriers to D/C:    none noted       Co-evaluation              AM-PAC OT "6 Clicks" Daily Activity     Outcome Measure Help from another person eating meals?: None Help from another person taking care of personal grooming?: None Help from another person toileting, which includes using toliet, bedpan, or urinal?: A Little Help from another person bathing (including washing, rinsing, drying)?: A Lot Help from another person to put on and taking off regular upper body clothing?: None Help from another person to put on and taking off regular lower body clothing?: A Lot 6 Click Score: 19   End of Session Equipment Utilized During Treatment: Rolling walker (2 wheels) Nurse Communication: Mobility status  Activity Tolerance: Patient limited by pain Patient left: in bed;with call bell/phone within reach  OT Visit Diagnosis: Unsteadiness on feet (R26.81);History of falling (Z91.81);Pain  Time: 1433-1500 OT Time Calculation (min): 27 min Charges:  OT General Charges $OT Visit: 1 Visit OT Evaluation $OT Eval Moderate Complexity: 1 Mod OT Treatments $Self Care/Home Management : 8-22 mins  09/08/2021  RP, OTR/L  Acute Rehabilitation Services  Office:  [phone]   Metta Clines 09/08/2021, 3:11 PM

## 2021-09-08 NOTE — Progress Notes (Signed)
   09/08/21 1633  Assess: MEWS Score  Temp 99.9 F (37.7 C)  BP (!) 91/49  Pulse Rate 68  ECG Heart Rate 68  Resp (!) 21  Assess: MEWS Score  MEWS Temp 0  MEWS Systolic 1  MEWS Pulse 0  MEWS RR 1  MEWS LOC 0  MEWS Score 2  MEWS Score Color Yellow  Assess: if the MEWS score is Yellow or Red  Were vital signs taken at a resting state? Yes  Focused Assessment No change from prior assessment  Early Detection of Sepsis Score *See Row Information* Low  MEWS guidelines implemented *See Row Information* Yes  Take Vital Signs  Increase Vital Sign Frequency  Yellow: Q 2hr X 2 then Q 4hr X 2, if remains yellow, continue Q 4hrs  Escalate  MEWS: Escalate Yellow: discuss with charge nurse/RN and consider discussing with provider and RRT  Notify: Charge Nurse/RN  Name of Charge Nurse/RN Notified Vaughan Basta RN  Date Charge Nurse/RN Notified 09/08/21  Time Charge Nurse/RN Notified 1640  Notify: Provider  Provider Name/Title Dr. Algis Liming  Date Provider Notified 09/08/21  Time Provider Notified 1640  Notification Type Page  Notification Reason Other (Comment) (low BP)  Provider response See new orders  Date of Provider Response 09/08/21  Time of Provider Response 1640  Document  Patient Outcome Other (Comment) (remains stable)  Progress note created (see row info) Yes

## 2021-09-09 ENCOUNTER — Encounter (HOSPITAL_COMMUNITY): Payer: Self-pay | Admitting: Internal Medicine

## 2021-09-09 DIAGNOSIS — I959 Hypotension, unspecified: Secondary | ICD-10-CM

## 2021-09-09 DIAGNOSIS — E860 Dehydration: Secondary | ICD-10-CM

## 2021-09-09 LAB — BASIC METABOLIC PANEL
Anion gap: 7 (ref 5–15)
BUN: 24 mg/dL — ABNORMAL HIGH (ref 8–23)
CO2: 24 mmol/L (ref 22–32)
Calcium: 8.1 mg/dL — ABNORMAL LOW (ref 8.9–10.3)
Chloride: 106 mmol/L (ref 98–111)
Creatinine, Ser: 2.26 mg/dL — ABNORMAL HIGH (ref 0.61–1.24)
GFR, Estimated: 27 mL/min — ABNORMAL LOW (ref >60)
Glucose, Bld: 108 mg/dL — ABNORMAL HIGH (ref 70–99)
Potassium: 3.4 mmol/L — ABNORMAL LOW (ref 3.5–5.1)
Sodium: 137 mmol/L (ref 135–145)

## 2021-09-09 LAB — MAGNESIUM: Magnesium: 2.4 mg/dL (ref 1.7–2.4)

## 2021-09-09 LAB — CBC
HCT: 37.1 % — ABNORMAL LOW (ref 39.0–52.0)
Hemoglobin: 12 g/dL — ABNORMAL LOW (ref 13.0–17.0)
MCH: 29.9 pg (ref 26.0–34.0)
MCHC: 32.3 g/dL (ref 30.0–36.0)
MCV: 92.3 fL (ref 80.0–100.0)
Platelets: 163 10*3/uL (ref 150–400)
RBC: 4.02 MIL/uL — ABNORMAL LOW (ref 4.22–5.81)
RDW: 13.7 % (ref 11.5–15.5)
WBC: 5.9 10*3/uL (ref 4.0–10.5)
nRBC: 0 % (ref 0.0–0.2)

## 2021-09-09 MED ORDER — CARVEDILOL 6.25 MG PO TABS: 3.1250 mg | ORAL_TABLET | Freq: Two times a day (BID) | ORAL | 1 refills | Status: DC

## 2021-09-09 MED ORDER — POTASSIUM CHLORIDE CRYS ER 20 MEQ PO TBCR
40.0000 meq | EXTENDED_RELEASE_TABLET | Freq: Once | ORAL | Status: AC
  Administered 2021-09-09: 06:00:00 40 meq via ORAL
  Filled 2021-09-09: qty 2

## 2021-09-09 MED ORDER — FUROSEMIDE 20 MG PO TABS
20.0000 mg | ORAL_TABLET | Freq: Two times a day (BID) | ORAL | Status: DC
  Administered 2021-09-09: 14:00:00 20 mg via ORAL
  Filled 2021-09-09: qty 1

## 2021-09-09 MED ORDER — OSELTAMIVIR PHOSPHATE 30 MG PO CAPS: 30.0000 mg | ORAL_CAPSULE | Freq: Every day | ORAL | 0 refills | Status: AC

## 2021-09-09 MED ORDER — GUAIFENESIN ER 600 MG PO TB12: 600.0000 mg | ORAL_TABLET | Freq: Two times a day (BID) | ORAL | 0 refills | Status: DC

## 2021-09-09 NOTE — TOC Transition Note (Signed)
Transition of Care [facility]) - CM/SW Discharge Note   Patient Details  Name: [name] MRN: [id number] Date of Birth: [birth year]  [facility] of [facility] CM/SW Contact:  Verdell Carmine, RN Phone Number: 09/09/2021, 1:32 PM   Clinical Narrative:     Patient discharging today, called room for [facility] arrangement, discharge planning, no answer, called wife who gave permission to seek advanced Home Care. He has a walker at home and believes that is all he needs for now.  Messaged Jason at [facility] for acceptance.   Final next level of care: [place] Barriers to Discharge: No Barriers Identified   Patient Goals and CMS Choice        Discharge Placement                       Discharge Plan and Services                          HH Arranged: PT, OT   Date HH Agency Contacted: 09/09/21 Time [place]: 1329 Representative spoke with at [place]: [place] ([place]) Interventions     Readmission Risk Interventions No flowsheet data found.

## 2021-09-09 NOTE — Progress Notes (Signed)
Patient Saturations on Room Air at Rest = 94%  Patient Saturations on [employer] while Ambulating = 92%  Ambulated in hall with walker with standby assist only.  Tolerated without problems.

## 2021-09-09 NOTE — Discharge Summary (Signed)
Physician Discharge Summary  [name] XLK:[id number] DOB: 12/29/1931  PCP: [name], MD  Admitted from: Home Discharged to: Home  Admit date: 09/07/2021 Discharge date: 09/09/2021  Recommendations for Outpatient Follow-up:    Follow-up Information     [name], MD. Schedule an appointment as soon as possible for a visit.   Specialty: Internal Medicine Why: To be seen in 3 to 5 days with repeat labs (CBC & BMP). Contact information: [place] [id number] [place] Orders (From admission, onward)     Start     Ordered   09/09/21 [place]  At discharge       Question Answer Comment  To provide the following care/treatments PT   To provide the following care/treatments OT      09/09/21 1304             Equipment/Devices: None    Discharge Condition: Improved and stable.   Code Status: Full Code Diet recommendation:  Discharge Diet Orders (From admission, onward)     Start     Ordered   09/09/21 0000  Diet - low sodium heart healthy        09/09/21 1304             Discharge Diagnoses:  Principal Problem:   Influenza A Active Problems:   Mixed hyperlipidemia   CAD, NATIVE VESSEL   Atrial fibrillation (HCC)   Chronic systolic CHF (congestive heart failure) (HCC)-EF 25%   AKI (acute kidney injury) (HCC)   CKD (chronic kidney disease) stage 4, GFR 15-29 ml/min (HCC)   Generalized weakness   Hypothyroidism   Abdominal distension (gaseous)   Brief Summary: 87 y.o. married male with medical history significant for HFrEF (EF 20-25%) s/p BiV ICD, CAD s/p multivessel stenting, PAF on Eliquis, CKD stage IV, HTN, HLD, hypothyroidism, BPH who presents to the ED 11/25 for evaluation of cough and weakness to a point where he was unable to get out of bed, dropped to the ground when family was assisting him and was unable to get him back up.  Seen in ED 11/21 for  bilateral thoracic back pain, CT abdomen/pelvis without contrast was negative for acute abnormality and patient discharged home.  Admitted for influenza A acute bronchitis with associated dehydration, generalized weakness and inability to walk.     Assessment & Plan:    Influenza A acute bronchitis: Presenting with fever, generalized weakness, cough, inability to maintain adequate oral intake or ambulate even with the use of his walker.  CXR without evidence of pneumonia. -Started on renal dosed Tamiflu 30 mg daily, has completed 3/5 days today -Briefly hydrated with IV fluids for 24 hours. -Continue supportive care.  Clinically improved.  Defervesced.  No leukocytosis and not hypoxic even with activity.   Dehydration/Hypotension BP 91/49 on 11/26 afternoon.  Carvedilol dose was reduced and last evening dose was even held.  Briefly hydrated with IV fluids.  Blood pressures have improved.  Would continue reduced dose carvedilol 3.125 Mg twice daily until close outpatient follow-up with PCP at which time the dose can be uptitrated if seemed reasonable.   Generalized weakness/back pain: Generalized weakness secondary to influenza A.  Previously reported back pain likely muscle spasm, this seems to be intermittent.  No clear etiology.  Has some focal tenderness over right posterior lower rib cage.  No rash  suggestive of shingles.  Reports that lidocaine patch does not really help. - CT abdomen and pelvis 11/21: Bones are diffusely undermineralized.  There is multilevel degenerative disc disease but no acute or suspicious osseous abnormality.  Wonder if his back pain is related to degenerative disc disease, but does not seem neurologic in nature.  -Per PT evaluation, ambulated 350 feet x 2 with rolling walker.  Will assist with home health PT and OT at discharge.   HFrEF s/p BiV ICD: Last EF 20-25%.  Appeared volume depleted on admission. -Held Lasix, and briefly hydrated with IV fluids. -Monitor  strict I/O's and daily weights -Due to hypotension, Coreg dose has been reduced as noted above.  Resume prior home dose of Farxiga by DC. -Not on ACE/ARB/spironolactone due to renal dysfunction -Clinically euvolemic at this time.  Resumed home dose of Lasix at discharge.   Paroxysmal atrial fibrillation: Stable with controlled rate.  Continue Coreg (reduced dose) and Eliquis.   AKI on CKD stage IV: Baseline creatinine may be in the 2.3-2.5 range.  Presented with creatinine of 2.79 which has improved to 2.26 after temporarily holding of Lasix and brief gentle IV fluids.  Monitor BMP closely as outpatient.   CAD: Stable, denies any chest pain.  Not on aspirin as he is on Eliquis.  Continue rosuvastatin and Zetia.   Hypothyroidism: Continue Synthroid.     Hyperlipidemia: Rosuvastatin and Zetia.  BPH: Continue Flomax.   Anemia: Suspect dilutional.  No bleeding reported.  Stable.   Abdominal discomfort/?  Distention - Recent CT abdomen and pelvis without acute findings.  KUB without acute findings.  Patient reports that after passing flatus and a large BM, his abdominal discomfort resolved.  Hypokalemia: Replaced.  Magnesium normal.  Microscopic hematuria Patient denies UTI symptoms.  Denies gross hematuria.  Unclear etiology.  Not sure if his right flank pain has any relationship to this.  Recommend repeating urine microscopy in a couple of weeks and if this persists then consider further evaluation including outpatient urology consultation.   Body mass index is 25.21 kg/m.          Consultants:   None   Procedures:   None   Discharge Instructions  Discharge Instructions     (HEART FAILURE PATIENTS) Call MD:  Anytime you have any of the following symptoms: 1) 3 pound weight gain in 24 hours or 5 pounds in 1 week 2) shortness of breath, with or without a dry hacking cough 3) swelling in the hands, feet or stomach 4) if you have to sleep on extra pillows at night in  order to breathe.   Complete by: As directed    Call MD for:  difficulty breathing, headache or visual disturbances   Complete by: As directed    Call MD for:  extreme fatigue   Complete by: As directed    Call MD for:  persistant dizziness or light-headedness   Complete by: As directed    Call MD for:  persistant nausea and vomiting   Complete by: As directed    Call MD for:  severe uncontrolled pain   Complete by: As directed    Call MD for:  temperature >100.4   Complete by: As directed    Diet - low sodium heart healthy   Complete by: As directed    Increase activity slowly   Complete by: As directed         Medication List     STOP taking these medications  hydrOXYzine 25 MG tablet Commonly known as: ATARAX/VISTARIL       TAKE these medications    acetaminophen 500 MG tablet Commonly known as: TYLENOL Take 500-1,000 mg by mouth every 6 (six) hours as needed (for pain).   apixaban 2.5 MG Tabs tablet Commonly known as: ELIQUIS Take 2.5 mg by mouth 2 (two) times daily.   carboxymethylcellulose 0.5 % Soln Commonly known as: REFRESH PLUS Place 1 drop into both eyes 3 (three) times daily as needed (dry/irritated eyes.).   carvedilol 6.25 MG tablet Commonly known as: COREG Take 0.5 tablets (3.125 mg total) by mouth 2 (two) times daily with a meal. What changed: how much to take   cephALEXin 500 MG capsule Commonly known as: KEFLEX Take 2,000 mg by mouth See admin instructions. Take 2,000 mg by mouth 45 minutes before dental procedures   diazepam 2 MG tablet Commonly known as: VALIUM Take 1 tablet (2 mg total) by mouth every 8 (eight) hours as needed for muscle spasms.   ezetimibe 10 MG tablet Commonly known as: ZETIA Take 10 mg by mouth daily with supper.   Farxiga 10 MG Tabs tablet Generic drug: dapagliflozin propanediol TAKE 1 TABLET BY MOUTH DAILY BEFORE BREAKFAST. What changed:  how much to take how to take this   ferrous sulfate 325 (65 FE)  MG tablet Take 325 mg by mouth daily with supper.   furosemide 20 MG tablet Commonly known as: LASIX Take 20 mg by mouth 2 (two) times daily.   guaiFENesin 600 MG 12 hr tablet Commonly known as: MUCINEX Take 1 tablet (600 mg total) by mouth 2 (two) times daily.   Klor-Con M10 10 MEQ tablet Generic drug: potassium chloride Take 10 mEq by mouth in the morning.   lidocaine 5 % Commonly known as: LIDODERM Place 1 patch onto the skin daily.   MILK OF MAGNESIA PO Take 20 mLs by mouth daily as needed (for constipation).   multivitamin with minerals Tabs tablet Take 1 tablet by mouth daily with supper. Centrum Silver   nitroGLYCERIN 0.4 MG SL tablet Commonly known as: NITROSTAT DISSOLVE ONE TABLET UNDER THE TONGUE EVERY 5 MINUTES AS NEEDED FOR CHEST PAIN.  DO NOT EXCEED A TOTAL OF 3 DOSES IN 15 MINUTES What changed: See the new instructions.   ondansetron 4 MG tablet Commonly known as: ZOFRAN Take 4 mg by mouth 3 (three) times daily as needed for nausea or vomiting.   oseltamivir 30 MG capsule Commonly known as: TAMIFLU Take 1 capsule (30 mg total) by mouth daily for 2 days. Start taking on: September 10, 2021   pantoprazole 40 MG tablet Commonly known as: PROTONIX Take 40 mg by mouth every evening.   rosuvastatin 20 MG tablet Commonly known as: CRESTOR Take 20 mg by mouth every evening.   sodium chloride 0.65 % Soln nasal spray Commonly known as: OCEAN Place 1 spray into both nostrils as needed for congestion.   Synthroid 75 MCG tablet Generic drug: levothyroxine Take 75 mcg by mouth daily before breakfast.   tamsulosin 0.4 MG Caps capsule Commonly known as: FLOMAX Take 0.4 mg by mouth at bedtime.   triamcinolone 55 MCG/ACT Aero nasal inhaler Commonly known as: NASACORT USE 2 SPRAYS IN EACH NOSTRIL ONCE DAILY AT NIGHT What changed: See the new instructions.       Allergies  Allergen Reactions   Gabapentin Itching   Ramipril Cough   Amoxicillin Hives and  Rash   Penicillin G Sodium Itching and Rash  Penicillins Itching and Rash    Has patient had a PCN reaction causing immediate rash, facial/tongue/throat swelling, SOB or lightheadedness with hypotension: No Has patient had a PCN reaction causing severe rash involving mucus membranes or skin necrosis: Yes-back and hands. Has patient had a PCN reaction that required hospitalization: No Has patient had a PCN reaction occurring within the last 10 years: Yes If all of the above answers are "NO", then may proceed with Cephalosporin use.      Procedures/Studies: CT ABDOMEN PELVIS WO CONTRAST  Result Date: 09/03/2021 CLINICAL DATA:  87 year old with abdominal pain. EXAM: CT ABDOMEN AND PELVIS WITHOUT CONTRAST TECHNIQUE: Multidetector CT imaging of the abdomen and pelvis was performed following the standard protocol without IV contrast. COMPARISON:  CT 12/07/2017 FINDINGS: Lower chest: No acute airspace disease or pleural effusion. Pacemaker with leads in the right atrium, right ventricle, and coronary sinus. Borderline cardiomegaly. Small to moderate-sized hiatal hernia Hepatobiliary: No focal liver abnormality is seen. No gallstones, gallbladder wall thickening, or biliary dilatation. Pancreas: Parenchymal atrophy. No ductal dilatation or inflammation. Spleen: Normal in size without focal abnormality. Adrenals/Urinary Tract: Normal adrenal glands. Bilateral renal parenchymal thinning. Left greater than right renal atrophy. No hydronephrosis or renal calculi. No evidence of focal renal abnormality. Urinary bladder is partially distended. There are multiple small bladder diverticula. No bladder wall thickening. Stomach/Bowel: Sigmoid colonic diverticulosis without diverticulitis. Moderate colonic stool burden. Normal appendix. No small bowel dilatation or obstruction. Small to moderate hiatal hernia. The stomach is decompressed. Vascular/Lymphatic: Aortic atherosclerosis. No aortic aneurysm. No portal venous  or mesenteric gas. No enlarged lymph nodes in the abdomen or pelvis. Reproductive: Prostatic calcifications. Other: No free air, free fluid, or intra-abdominal fluid collection. Minimal fat in the inguinal canals. Musculoskeletal: Bones are diffusely under mineralized. There is multilevel degenerative disc disease. There are no acute or suspicious osseous abnormalities. IMPRESSION: 1. No acute abnormality or explanation for abdominal pain. 2. Colonic diverticulosis without diverticulitis. 3. Small to moderate-sized hiatal hernia. 4. Bilateral renal atrophy. Multiple small bladder diverticula. Aortic Atherosclerosis (ICD10-I70.0). Electronically Signed   By: Keith Rake M.D.   On: 09/03/2021 21:02   DG Chest 2 View  Result Date: 09/07/2021 CLINICAL DATA:  Cough with congestion, wheezing and fever. EXAM: CHEST - 2 VIEW COMPARISON:  Radiographs 10/12/2017.  CT 05/06/2005. FINDINGS: The lateral view is limited by a metallic structure overlying the thoracic spine. Left subclavian biventricular AICD leads appear unchanged. The heart size and mediastinal contours are stable. There is mild chronic vascular congestion, but no evidence of edema, confluent airspace opacity, pleural effusion or pneumothorax. No acute osseous findings are evident with limited assessment of the spine due to artifact. IMPRESSION: Stable mild cardiomegaly and chronic vascular congestion. No acute cardiopulmonary process identified. Electronically Signed   By: Richardean Sale M.D.   On: 09/07/2021 16:42   DG Abd Portable 1V  Result Date: 09/08/2021 CLINICAL DATA:  Abdominal distension. EXAM: PORTABLE ABDOMEN - 1 VIEW COMPARISON:  09/03/2021 CT FINDINGS: Nondistended gas-filled loops of small bowel and colon are noted. No dilated bowel loops are present. No suspicious calcifications are identified. IMPRESSION: Nonspecific nonobstructive bowel gas pattern. Electronically Signed   By: Margarette Canada M.D.   On: 09/08/2021 12:57       Subjective: Insistent on being discharged.  Reports that the abdominal pain that he had this morning resolved after passing flatus and a large BM that was confirmed by RN.  Still having some right lower flank pain.  Denies fall or blunt trauma.  No dyspnea or chest pain.  States that he is not eating much here and prefers to go home and eat food cooked by his wife.  Discharge Exam:  Vitals:   09/09/21 0636 09/09/21 0641 09/09/21 0831 09/09/21 1130  BP:  (!) 117/51 (!) 109/56 116/60  Pulse:  74 63 63  Resp: 20 20 16 19   Temp:   98.2 F (36.8 C) 98.3 F (36.8 C)  TempSrc:   Oral Oral  SpO2:  94% 92% 96%  Weight:        General exam: Elderly male, moderately built, frail, lying comfortably propped up in bed.  Oral mucosa moist. Respiratory system: Slightly harsh but no wheezing, rhonchi or crackles.  No increased work of breathing.  Able to speak in full sentences. Cardiovascular system: S1 & S2 heard, RRR. No JVD, murmurs, rubs, gallops or clicks. No pedal edema.  Telemetry personally reviewed: AV paced rhythm. Gastrointestinal system: Abdomen appears somewhat protuberant/distended but soft and nontender.  No organomegaly or masses appreciated.  Normal bowel sounds heard. Central nervous system: Alert and oriented. No focal neurological deficits. Extremities: Symmetric 5 x 5 power. Skin: No rashes, lesions or ulcers Psychiatry: Judgement and insight appear somewhat impaired. Mood & affect appropriate.     The results of significant diagnostics from this hospitalization (including imaging, microbiology, ancillary and laboratory) are listed below for reference.     Microbiology: Recent Results (from the past 240 hour(s))  Urine Culture     Status: Abnormal   Collection Time: 09/07/21  2:50 PM   Specimen: Urine, Clean Catch  Result Value Ref Range Status   Specimen Description URINE, CLEAN CATCH  Final   Special Requests   Final    NONE Performed at [facility], [address] [place], [place] [address]    Culture MULTIPLE SPECIES PRESENT, SUGGEST RECOLLECTION (A)  Final   Report Status 09/08/2021 FINAL  Final  Resp Panel by RT-PCR (Flu A&B, Covid) Nasopharyngeal Swab     Status: Abnormal   Collection Time: 09/07/21  2:51 PM   Specimen: Nasopharyngeal Swab; Nasopharyngeal(NP) swabs in vial transport medium  Result Value Ref Range Status   SARS Coronavirus 2 by RT PCR NEGATIVE NEGATIVE Final    Comment: (NOTE) SARS-CoV-2 target nucleic acids are NOT DETECTED.  The SARS-CoV-2 RNA is generally detectable in upper respiratory specimens during the acute phase of infection. The lowest concentration of SARS-CoV-2 viral copies this assay can detect is 138 copies/mL. A negative result does not preclude SARS-Cov-2 infection and should not be used as the sole basis for treatment or other patient management decisions. A negative result may occur with  improper specimen collection/handling, submission of specimen other than nasopharyngeal swab, presence of viral mutation(s) within the areas targeted by this assay, and inadequate number of viral copies(<138 copies/mL). A negative result must be combined with clinical observations, patient history, and epidemiological information. The expected result is Negative.  Fact Sheet for Patients:  EntrepreneurPulse.com.au  Fact Sheet for Healthcare Providers:  IncredibleEmployment.be  This test is no t yet approved or cleared by the [place] FDA and  has been authorized for detection and/or diagnosis of SARS-CoV-2 by FDA under an Emergency Use Authorization (EUA). This EUA will remain  in effect (meaning this test can be used) for the duration of the COVID-19 declaration under Section 564(b)(1) of the Act, 21 U.S.C.section 360bbb-3(b)(1), unless the authorization is terminated  or revoked sooner.       Influenza A by PCR  POSITIVE (A) NEGATIVE Final   Influenza B by  PCR NEGATIVE NEGATIVE Final    Comment: (NOTE) The Xpert Xpress SARS-CoV-2/FLU/RSV plus assay is intended as an aid in the diagnosis of influenza from Nasopharyngeal swab specimens and should not be used as a sole basis for treatment. Nasal washings and aspirates are unacceptable for Xpert Xpress SARS-CoV-2/FLU/RSV testing.  Fact Sheet for Patients: EntrepreneurPulse.com.au  Fact Sheet for Healthcare Providers: IncredibleEmployment.be  This test is not yet approved or cleared by the [place] FDA and has been authorized for detection and/or diagnosis of SARS-CoV-2 by FDA under an Emergency Use Authorization (EUA). This EUA will remain in effect (meaning this test can be used) for the duration of the COVID-19 declaration under Section 564(b)(1) of the Act, 21 U.S.C. section 360bbb-3(b)(1), unless the authorization is terminated or revoked.  Performed at [facility], [place] [address], [place], [place] [address]      Labs: CBC: Recent Labs  Lab 09/03/21 1513 09/07/21 1450 09/08/21 0252 09/09/21 0216  WBC 9.1 7.6 4.4 5.9  NEUTROABS 5.8 6.1  --   --   HGB 15.0 14.3 11.7* 12.0*  HCT 48.1 45.2 37.0* 37.1*  MCV 93.2 94.0 92.5 92.3  PLT 247 214 173 382    Basic Metabolic Panel: Recent Labs  Lab 09/03/21 1513 09/07/21 1450 09/08/21 0252 09/09/21 0216  NA 140 140 136 137  K 4.1 4.7 3.5 3.4*  CL 101 103 103 106  CO2 26 27 27 24   GLUCOSE 119* 130* 99 108*  BUN 30* 31* 27* 24*  CREATININE 2.79* 2.68* 2.48* 2.26*  CALCIUM 9.3 9.2 8.4* 8.1*  MG  --   --   --  2.4    Liver Function Tests: Recent Labs  Lab 09/03/21 1513 09/07/21 1450  AST 21 28  ALT 9 13  ALKPHOS 83 89  BILITOT 1.3* 1.0  PROT 7.7 7.3  ALBUMIN 3.7 3.6     Urinalysis    Component Value Date/Time   COLORURINE YELLOW 09/07/2021 [place] 09/07/2021 1744   LABSPEC 1.017 09/07/2021 1744   PHURINE 5.0 09/07/2021 1744   GLUCOSEU >=500  (A) 09/07/2021 1744   HGBUR MODERATE (A) 09/07/2021 [place] 09/07/2021 [place] 09/07/2021 1744   PROTEINUR NEGATIVE 09/07/2021 1744   UROBILINOGEN 0.2 08/15/2013 1132   NITRITE NEGATIVE 09/07/2021 1744   LEUKOCYTESUR NEGATIVE 09/07/2021 1744    I discussed in detail with patient spouse via phone, updated care and answered all questions.  Time coordinating discharge: 25 minutes  SIGNED:  Vernell Leep, MD,  FACP, [facility], [facility], [facility] (Care Management Physician Certified). [facility]  To contact the attending provider between 7A-7P or the covering provider during after hours 7P-7A, please log into the web site [email or web address] and access using universal [place] password for that web site. If you do not have the password, please call the hospital operator.

## 2021-09-09 NOTE — Discharge Instructions (Signed)

## 2021-09-10 ENCOUNTER — Telehealth (HOSPITAL_COMMUNITY): Payer: Self-pay | Admitting: *Deleted

## 2021-09-10 NOTE — Telephone Encounter (Signed)
Pt's wife left VM about pt being hospitalized and med changes.  Called and spoke w/pt's wife, pt admitted w/the flu on Fri 11/25 and is home now. She states his Carvedilol was cut in half and she wanted to make sure that was ok with Dr Haroldine Laws and see how long pt should be on this dose. Per chart Carvedilol was cut back to 3.125 mg BID due to low BP. Advised pt should continue on that dose for now, once he has recovered she should keep BP log and if pt trending up we can increase. She also has a call into PCP office to sch appt and f/u lab work. She will continue pt on 3.125 mg BID for now

## 2021-09-11 DIAGNOSIS — D631 Anemia in chronic kidney disease: Secondary | ICD-10-CM | POA: Diagnosis not present

## 2021-09-11 DIAGNOSIS — M549 Dorsalgia, unspecified: Secondary | ICD-10-CM | POA: Diagnosis not present

## 2021-09-11 DIAGNOSIS — J440 Chronic obstructive pulmonary disease with acute lower respiratory infection: Secondary | ICD-10-CM | POA: Diagnosis not present

## 2021-09-11 DIAGNOSIS — I959 Hypotension, unspecified: Secondary | ICD-10-CM | POA: Diagnosis not present

## 2021-09-11 DIAGNOSIS — I5022 Chronic systolic (congestive) heart failure: Secondary | ICD-10-CM | POA: Diagnosis not present

## 2021-09-11 DIAGNOSIS — I2510 Atherosclerotic heart disease of native coronary artery without angina pectoris: Secondary | ICD-10-CM | POA: Diagnosis not present

## 2021-09-11 DIAGNOSIS — K219 Gastro-esophageal reflux disease without esophagitis: Secondary | ICD-10-CM | POA: Diagnosis not present

## 2021-09-11 DIAGNOSIS — Z87891 Personal history of nicotine dependence: Secondary | ICD-10-CM | POA: Diagnosis not present

## 2021-09-11 DIAGNOSIS — I130 Hypertensive heart and chronic kidney disease with heart failure and stage 1 through stage 4 chronic kidney disease, or unspecified chronic kidney disease: Secondary | ICD-10-CM | POA: Diagnosis not present

## 2021-09-11 DIAGNOSIS — G4730 Sleep apnea, unspecified: Secondary | ICD-10-CM | POA: Diagnosis not present

## 2021-09-11 DIAGNOSIS — I480 Paroxysmal atrial fibrillation: Secondary | ICD-10-CM | POA: Diagnosis not present

## 2021-09-11 DIAGNOSIS — N400 Enlarged prostate without lower urinary tract symptoms: Secondary | ICD-10-CM | POA: Diagnosis not present

## 2021-09-11 DIAGNOSIS — F325 Major depressive disorder, single episode, in full remission: Secondary | ICD-10-CM | POA: Diagnosis not present

## 2021-09-11 DIAGNOSIS — N184 Chronic kidney disease, stage 4 (severe): Secondary | ICD-10-CM | POA: Diagnosis not present

## 2021-09-11 DIAGNOSIS — E039 Hypothyroidism, unspecified: Secondary | ICD-10-CM | POA: Diagnosis not present

## 2021-09-11 DIAGNOSIS — E782 Mixed hyperlipidemia: Secondary | ICD-10-CM | POA: Diagnosis not present

## 2021-09-11 DIAGNOSIS — R3121 Asymptomatic microscopic hematuria: Secondary | ICD-10-CM | POA: Diagnosis not present

## 2021-09-11 DIAGNOSIS — J208 Acute bronchitis due to other specified organisms: Secondary | ICD-10-CM | POA: Diagnosis not present

## 2021-09-11 DIAGNOSIS — Z9181 History of falling: Secondary | ICD-10-CM | POA: Diagnosis not present

## 2021-09-11 DIAGNOSIS — M1990 Unspecified osteoarthritis, unspecified site: Secondary | ICD-10-CM | POA: Diagnosis not present

## 2021-09-11 DIAGNOSIS — J101 Influenza due to other identified influenza virus with other respiratory manifestations: Secondary | ICD-10-CM | POA: Diagnosis not present

## 2021-09-11 DIAGNOSIS — R251 Tremor, unspecified: Secondary | ICD-10-CM | POA: Diagnosis not present

## 2021-09-12 DIAGNOSIS — N184 Chronic kidney disease, stage 4 (severe): Secondary | ICD-10-CM | POA: Diagnosis not present

## 2021-09-12 DIAGNOSIS — J101 Influenza due to other identified influenza virus with other respiratory manifestations: Secondary | ICD-10-CM | POA: Diagnosis not present

## 2021-09-12 DIAGNOSIS — I130 Hypertensive heart and chronic kidney disease with heart failure and stage 1 through stage 4 chronic kidney disease, or unspecified chronic kidney disease: Secondary | ICD-10-CM | POA: Diagnosis not present

## 2021-09-12 DIAGNOSIS — J440 Chronic obstructive pulmonary disease with acute lower respiratory infection: Secondary | ICD-10-CM | POA: Diagnosis not present

## 2021-09-12 DIAGNOSIS — I5022 Chronic systolic (congestive) heart failure: Secondary | ICD-10-CM | POA: Diagnosis not present

## 2021-09-12 DIAGNOSIS — J208 Acute bronchitis due to other specified organisms: Secondary | ICD-10-CM | POA: Diagnosis not present

## 2021-09-14 DIAGNOSIS — J101 Influenza due to other identified influenza virus with other respiratory manifestations: Secondary | ICD-10-CM | POA: Diagnosis not present

## 2021-09-14 DIAGNOSIS — R195 Other fecal abnormalities: Secondary | ICD-10-CM | POA: Diagnosis not present

## 2021-09-14 DIAGNOSIS — I130 Hypertensive heart and chronic kidney disease with heart failure and stage 1 through stage 4 chronic kidney disease, or unspecified chronic kidney disease: Secondary | ICD-10-CM | POA: Diagnosis not present

## 2021-09-14 DIAGNOSIS — I480 Paroxysmal atrial fibrillation: Secondary | ICD-10-CM | POA: Diagnosis not present

## 2021-09-14 DIAGNOSIS — M546 Pain in thoracic spine: Secondary | ICD-10-CM | POA: Diagnosis not present

## 2021-09-14 DIAGNOSIS — I2510 Atherosclerotic heart disease of native coronary artery without angina pectoris: Secondary | ICD-10-CM | POA: Diagnosis not present

## 2021-09-14 DIAGNOSIS — E039 Hypothyroidism, unspecified: Secondary | ICD-10-CM | POA: Diagnosis not present

## 2021-09-14 DIAGNOSIS — N184 Chronic kidney disease, stage 4 (severe): Secondary | ICD-10-CM | POA: Diagnosis not present

## 2021-09-14 DIAGNOSIS — D649 Anemia, unspecified: Secondary | ICD-10-CM | POA: Diagnosis not present

## 2021-09-14 DIAGNOSIS — R052 Subacute cough: Secondary | ICD-10-CM | POA: Diagnosis not present

## 2021-09-14 DIAGNOSIS — I5022 Chronic systolic (congestive) heart failure: Secondary | ICD-10-CM | POA: Diagnosis not present

## 2021-09-14 DIAGNOSIS — R140 Abdominal distension (gaseous): Secondary | ICD-10-CM | POA: Diagnosis not present

## 2021-09-17 DIAGNOSIS — J440 Chronic obstructive pulmonary disease with acute lower respiratory infection: Secondary | ICD-10-CM | POA: Diagnosis not present

## 2021-09-17 DIAGNOSIS — N184 Chronic kidney disease, stage 4 (severe): Secondary | ICD-10-CM | POA: Diagnosis not present

## 2021-09-17 DIAGNOSIS — J101 Influenza due to other identified influenza virus with other respiratory manifestations: Secondary | ICD-10-CM | POA: Diagnosis not present

## 2021-09-17 DIAGNOSIS — I130 Hypertensive heart and chronic kidney disease with heart failure and stage 1 through stage 4 chronic kidney disease, or unspecified chronic kidney disease: Secondary | ICD-10-CM | POA: Diagnosis not present

## 2021-09-17 DIAGNOSIS — I5022 Chronic systolic (congestive) heart failure: Secondary | ICD-10-CM | POA: Diagnosis not present

## 2021-09-17 DIAGNOSIS — J208 Acute bronchitis due to other specified organisms: Secondary | ICD-10-CM | POA: Diagnosis not present

## 2021-09-21 DIAGNOSIS — I5022 Chronic systolic (congestive) heart failure: Secondary | ICD-10-CM | POA: Diagnosis not present

## 2021-09-21 DIAGNOSIS — E46 Unspecified protein-calorie malnutrition: Secondary | ICD-10-CM | POA: Diagnosis not present

## 2021-09-21 DIAGNOSIS — R739 Hyperglycemia, unspecified: Secondary | ICD-10-CM | POA: Diagnosis not present

## 2021-09-21 DIAGNOSIS — I700 Atherosclerosis of aorta: Secondary | ICD-10-CM | POA: Diagnosis not present

## 2021-09-21 DIAGNOSIS — E785 Hyperlipidemia, unspecified: Secondary | ICD-10-CM | POA: Diagnosis not present

## 2021-09-21 DIAGNOSIS — R195 Other fecal abnormalities: Secondary | ICD-10-CM | POA: Diagnosis not present

## 2021-09-21 DIAGNOSIS — K921 Melena: Secondary | ICD-10-CM | POA: Diagnosis not present

## 2021-09-21 DIAGNOSIS — D692 Other nonthrombocytopenic purpura: Secondary | ICD-10-CM | POA: Diagnosis not present

## 2021-09-21 DIAGNOSIS — D696 Thrombocytopenia, unspecified: Secondary | ICD-10-CM | POA: Diagnosis not present

## 2021-09-21 DIAGNOSIS — Z0000 Encounter for general adult medical examination without abnormal findings: Secondary | ICD-10-CM | POA: Diagnosis not present

## 2021-09-21 DIAGNOSIS — I6529 Occlusion and stenosis of unspecified carotid artery: Secondary | ICD-10-CM | POA: Diagnosis not present

## 2021-09-21 DIAGNOSIS — J449 Chronic obstructive pulmonary disease, unspecified: Secondary | ICD-10-CM | POA: Diagnosis not present

## 2021-09-24 DIAGNOSIS — N184 Chronic kidney disease, stage 4 (severe): Secondary | ICD-10-CM | POA: Diagnosis not present

## 2021-09-24 DIAGNOSIS — I130 Hypertensive heart and chronic kidney disease with heart failure and stage 1 through stage 4 chronic kidney disease, or unspecified chronic kidney disease: Secondary | ICD-10-CM | POA: Diagnosis not present

## 2021-09-24 DIAGNOSIS — J208 Acute bronchitis due to other specified organisms: Secondary | ICD-10-CM | POA: Diagnosis not present

## 2021-09-24 DIAGNOSIS — I5022 Chronic systolic (congestive) heart failure: Secondary | ICD-10-CM | POA: Diagnosis not present

## 2021-09-24 DIAGNOSIS — J101 Influenza due to other identified influenza virus with other respiratory manifestations: Secondary | ICD-10-CM | POA: Diagnosis not present

## 2021-09-24 DIAGNOSIS — J440 Chronic obstructive pulmonary disease with acute lower respiratory infection: Secondary | ICD-10-CM | POA: Diagnosis not present

## 2021-09-26 DIAGNOSIS — J440 Chronic obstructive pulmonary disease with acute lower respiratory infection: Secondary | ICD-10-CM | POA: Diagnosis not present

## 2021-09-26 DIAGNOSIS — N184 Chronic kidney disease, stage 4 (severe): Secondary | ICD-10-CM | POA: Diagnosis not present

## 2021-09-26 DIAGNOSIS — J101 Influenza due to other identified influenza virus with other respiratory manifestations: Secondary | ICD-10-CM | POA: Diagnosis not present

## 2021-09-26 DIAGNOSIS — I5022 Chronic systolic (congestive) heart failure: Secondary | ICD-10-CM | POA: Diagnosis not present

## 2021-09-26 DIAGNOSIS — I130 Hypertensive heart and chronic kidney disease with heart failure and stage 1 through stage 4 chronic kidney disease, or unspecified chronic kidney disease: Secondary | ICD-10-CM | POA: Diagnosis not present

## 2021-09-26 DIAGNOSIS — J208 Acute bronchitis due to other specified organisms: Secondary | ICD-10-CM | POA: Diagnosis not present

## 2021-09-28 DIAGNOSIS — E039 Hypothyroidism, unspecified: Secondary | ICD-10-CM | POA: Diagnosis not present

## 2021-09-28 DIAGNOSIS — I10 Essential (primary) hypertension: Secondary | ICD-10-CM | POA: Diagnosis not present

## 2021-09-28 DIAGNOSIS — R195 Other fecal abnormalities: Secondary | ICD-10-CM | POA: Diagnosis not present

## 2021-09-28 DIAGNOSIS — E785 Hyperlipidemia, unspecified: Secondary | ICD-10-CM | POA: Diagnosis not present

## 2021-09-28 DIAGNOSIS — R634 Abnormal weight loss: Secondary | ICD-10-CM | POA: Diagnosis not present

## 2021-09-28 DIAGNOSIS — R140 Abdominal distension (gaseous): Secondary | ICD-10-CM | POA: Diagnosis not present

## 2021-09-28 DIAGNOSIS — C679 Malignant neoplasm of bladder, unspecified: Secondary | ICD-10-CM | POA: Diagnosis not present

## 2021-09-28 DIAGNOSIS — Z125 Encounter for screening for malignant neoplasm of prostate: Secondary | ICD-10-CM | POA: Diagnosis not present

## 2021-09-28 DIAGNOSIS — R739 Hyperglycemia, unspecified: Secondary | ICD-10-CM | POA: Diagnosis not present

## 2021-10-01 ENCOUNTER — Ambulatory Visit (INDEPENDENT_AMBULATORY_CARE_PROVIDER_SITE_OTHER): Payer: Medicare Other

## 2021-10-01 DIAGNOSIS — I255 Ischemic cardiomyopathy: Secondary | ICD-10-CM

## 2021-10-01 LAB — CUP PACEART REMOTE DEVICE CHECK
Battery Remaining Longevity: 59 mo
Battery Remaining Percentage: 79 %
Battery Voltage: 2.98 V
Brady Statistic AP VP Percent: 81 %
Brady Statistic AP VS Percent: 1.4 %
Brady Statistic AS VP Percent: 15 %
Brady Statistic AS VS Percent: 2.4 %
Brady Statistic RA Percent Paced: 76 %
Date Time Interrogation Session: 20221219020033
HighPow Impedance: 49 Ohm
HighPow Impedance: 49 Ohm
Implantable Lead Implant Date: 20060509
Implantable Lead Implant Date: 20060509
Implantable Lead Implant Date: 20090417
Implantable Lead Location: 753858
Implantable Lead Location: 753859
Implantable Lead Location: 753860
Implantable Lead Model: 5076
Implantable Lead Model: 7001
Implantable Pulse Generator Implant Date: 20211220
Lead Channel Impedance Value: 400 Ohm
Lead Channel Impedance Value: 440 Ohm
Lead Channel Impedance Value: 450 Ohm
Lead Channel Pacing Threshold Amplitude: 1 V
Lead Channel Pacing Threshold Amplitude: 1 V
Lead Channel Pacing Threshold Amplitude: 1 V
Lead Channel Pacing Threshold Pulse Width: 0.5 ms
Lead Channel Pacing Threshold Pulse Width: 0.8 ms
Lead Channel Pacing Threshold Pulse Width: 0.8 ms
Lead Channel Sensing Intrinsic Amplitude: 12 mV
Lead Channel Sensing Intrinsic Amplitude: 3.1 mV
Lead Channel Setting Pacing Amplitude: 2 V
Lead Channel Setting Pacing Amplitude: 2 V
Lead Channel Setting Pacing Amplitude: 2 V
Lead Channel Setting Pacing Pulse Width: 0.8 ms
Lead Channel Setting Pacing Pulse Width: 0.8 ms
Lead Channel Setting Sensing Sensitivity: 0.5 mV
Pulse Gen Serial Number: 9905796

## 2021-10-02 DIAGNOSIS — J208 Acute bronchitis due to other specified organisms: Secondary | ICD-10-CM | POA: Diagnosis not present

## 2021-10-02 DIAGNOSIS — J440 Chronic obstructive pulmonary disease with acute lower respiratory infection: Secondary | ICD-10-CM | POA: Diagnosis not present

## 2021-10-02 DIAGNOSIS — N184 Chronic kidney disease, stage 4 (severe): Secondary | ICD-10-CM | POA: Diagnosis not present

## 2021-10-02 DIAGNOSIS — I130 Hypertensive heart and chronic kidney disease with heart failure and stage 1 through stage 4 chronic kidney disease, or unspecified chronic kidney disease: Secondary | ICD-10-CM | POA: Diagnosis not present

## 2021-10-02 DIAGNOSIS — I5022 Chronic systolic (congestive) heart failure: Secondary | ICD-10-CM | POA: Diagnosis not present

## 2021-10-02 DIAGNOSIS — J101 Influenza due to other identified influenza virus with other respiratory manifestations: Secondary | ICD-10-CM | POA: Diagnosis not present

## 2021-10-04 DIAGNOSIS — J208 Acute bronchitis due to other specified organisms: Secondary | ICD-10-CM | POA: Diagnosis not present

## 2021-10-04 DIAGNOSIS — I130 Hypertensive heart and chronic kidney disease with heart failure and stage 1 through stage 4 chronic kidney disease, or unspecified chronic kidney disease: Secondary | ICD-10-CM | POA: Diagnosis not present

## 2021-10-04 DIAGNOSIS — I5022 Chronic systolic (congestive) heart failure: Secondary | ICD-10-CM | POA: Diagnosis not present

## 2021-10-04 DIAGNOSIS — J101 Influenza due to other identified influenza virus with other respiratory manifestations: Secondary | ICD-10-CM | POA: Diagnosis not present

## 2021-10-04 DIAGNOSIS — N184 Chronic kidney disease, stage 4 (severe): Secondary | ICD-10-CM | POA: Diagnosis not present

## 2021-10-04 DIAGNOSIS — J440 Chronic obstructive pulmonary disease with acute lower respiratory infection: Secondary | ICD-10-CM | POA: Diagnosis not present

## 2021-10-10 ENCOUNTER — Emergency Department (HOSPITAL_COMMUNITY): Payer: Medicare Other

## 2021-10-10 ENCOUNTER — Emergency Department (HOSPITAL_COMMUNITY)
Admission: EM | Admit: 2021-10-10 | Discharge: 2021-10-11 | Disposition: A | Discharge status: Home / Self Care | Payer: Medicare Other | Attending: Emergency Medicine | Admitting: Emergency Medicine

## 2021-10-10 ENCOUNTER — Encounter (HOSPITAL_COMMUNITY): Payer: Self-pay

## 2021-10-10 DIAGNOSIS — I2510 Atherosclerotic heart disease of native coronary artery without angina pectoris: Secondary | ICD-10-CM | POA: Insufficient documentation

## 2021-10-10 DIAGNOSIS — Z95810 Presence of automatic (implantable) cardiac defibrillator: Secondary | ICD-10-CM | POA: Diagnosis not present

## 2021-10-10 DIAGNOSIS — N184 Chronic kidney disease, stage 4 (severe): Secondary | ICD-10-CM | POA: Insufficient documentation

## 2021-10-10 DIAGNOSIS — Z8551 Personal history of malignant neoplasm of bladder: Secondary | ICD-10-CM | POA: Diagnosis not present

## 2021-10-10 DIAGNOSIS — R001 Bradycardia, unspecified: Secondary | ICD-10-CM | POA: Diagnosis not present

## 2021-10-10 DIAGNOSIS — I5022 Chronic systolic (congestive) heart failure: Secondary | ICD-10-CM | POA: Diagnosis not present

## 2021-10-10 DIAGNOSIS — U071 COVID-19: Secondary | ICD-10-CM

## 2021-10-10 DIAGNOSIS — R0902 Hypoxemia: Secondary | ICD-10-CM | POA: Diagnosis not present

## 2021-10-10 DIAGNOSIS — J449 Chronic obstructive pulmonary disease, unspecified: Secondary | ICD-10-CM | POA: Diagnosis not present

## 2021-10-10 DIAGNOSIS — Z87891 Personal history of nicotine dependence: Secondary | ICD-10-CM | POA: Diagnosis not present

## 2021-10-10 DIAGNOSIS — R531 Weakness: Secondary | ICD-10-CM | POA: Diagnosis not present

## 2021-10-10 DIAGNOSIS — I959 Hypotension, unspecified: Secondary | ICD-10-CM | POA: Diagnosis not present

## 2021-10-10 DIAGNOSIS — Z7901 Long term (current) use of anticoagulants: Secondary | ICD-10-CM | POA: Insufficient documentation

## 2021-10-10 DIAGNOSIS — Z85828 Personal history of other malignant neoplasm of skin: Secondary | ICD-10-CM | POA: Diagnosis not present

## 2021-10-10 DIAGNOSIS — I517 Cardiomegaly: Secondary | ICD-10-CM | POA: Diagnosis not present

## 2021-10-10 DIAGNOSIS — I130 Hypertensive heart and chronic kidney disease with heart failure and stage 1 through stage 4 chronic kidney disease, or unspecified chronic kidney disease: Secondary | ICD-10-CM | POA: Insufficient documentation

## 2021-10-10 DIAGNOSIS — R059 Cough, unspecified: Secondary | ICD-10-CM | POA: Diagnosis not present

## 2021-10-10 DIAGNOSIS — E039 Hypothyroidism, unspecified: Secondary | ICD-10-CM | POA: Diagnosis not present

## 2021-10-10 DIAGNOSIS — R197 Diarrhea, unspecified: Secondary | ICD-10-CM | POA: Diagnosis not present

## 2021-10-10 LAB — CBC WITH DIFFERENTIAL/PLATELET
Abs Immature Granulocytes: 0.02 10*3/uL (ref 0.00–0.07)
Basophils Absolute: 0 10*3/uL (ref 0.0–0.1)
Basophils Relative: 1 %
Eosinophils Absolute: 0 10*3/uL (ref 0.0–0.5)
Eosinophils Relative: 0 %
HCT: 40.2 % (ref 39.0–52.0)
Hemoglobin: 12.5 g/dL — ABNORMAL LOW (ref 13.0–17.0)
Immature Granulocytes: 0 %
Lymphocytes Relative: 28 %
Lymphs Abs: 1.5 10*3/uL (ref 0.7–4.0)
MCH: 28.9 pg (ref 26.0–34.0)
MCHC: 31.1 g/dL (ref 30.0–36.0)
MCV: 93.1 fL (ref 80.0–100.0)
Monocytes Absolute: 0.9 10*3/uL (ref 0.1–1.0)
Monocytes Relative: 17 %
Neutro Abs: 2.8 10*3/uL (ref 1.7–7.7)
Neutrophils Relative %: 54 %
Platelets: 203 10*3/uL (ref 150–400)
RBC: 4.32 MIL/uL (ref 4.22–5.81)
RDW: 14.5 % (ref 11.5–15.5)
WBC: 5.2 10*3/uL (ref 4.0–10.5)
nRBC: 0 % (ref 0.0–0.2)

## 2021-10-10 LAB — URINALYSIS, ROUTINE W REFLEX MICROSCOPIC
Bilirubin Urine: NEGATIVE
Glucose, UA: >=500 mg/dL — AB
Hgb urine dipstick: NEGATIVE
Ketones, ur: NEGATIVE mg/dL
Leukocytes,Ua: NEGATIVE
Nitrite: NEGATIVE
Protein, ur: NEGATIVE mg/dL
Specific Gravity, Urine: 1.013 (ref 1.005–1.030)
pH: 5 (ref 5.0–8.0)

## 2021-10-10 LAB — RESP PANEL BY RT-PCR (FLU A&B, COVID) ARPGX2
Influenza A by PCR: NEGATIVE
Influenza B by PCR: NEGATIVE
SARS Coronavirus 2 by RT PCR: POSITIVE — AB

## 2021-10-10 LAB — TROPONIN I (HIGH SENSITIVITY)
Troponin I (High Sensitivity): 22 ng/L — ABNORMAL HIGH (ref <18)
Troponin I (High Sensitivity): 19 ng/L — ABNORMAL HIGH (ref <18)

## 2021-10-10 LAB — COMPREHENSIVE METABOLIC PANEL
ALT: 19 U/L (ref 0–44)
AST: 40 U/L (ref 15–41)
Albumin: 3.1 g/dL — ABNORMAL LOW (ref 3.5–5.0)
Alkaline Phosphatase: 72 U/L (ref 38–126)
Anion gap: 7 (ref 5–15)
BUN: 34 mg/dL — ABNORMAL HIGH (ref 8–23)
CO2: 27 mmol/L (ref 22–32)
Calcium: 8.3 mg/dL — ABNORMAL LOW (ref 8.9–10.3)
Chloride: 101 mmol/L (ref 98–111)
Creatinine, Ser: 2.27 mg/dL — ABNORMAL HIGH (ref 0.61–1.24)
GFR, Estimated: 27 mL/min — ABNORMAL LOW (ref >60)
Glucose, Bld: 121 mg/dL — ABNORMAL HIGH (ref 70–99)
Potassium: 3.6 mmol/L (ref 3.5–5.1)
Sodium: 135 mmol/L (ref 135–145)
Total Bilirubin: 0.5 mg/dL (ref 0.3–1.2)
Total Protein: 6.8 g/dL (ref 6.5–8.1)

## 2021-10-10 LAB — BRAIN NATRIURETIC PEPTIDE: B Natriuretic Peptide: 257.7 pg/mL — ABNORMAL HIGH (ref 0.0–100.0)

## 2021-10-10 LAB — TSH: TSH: 0.503 u[IU]/mL (ref 0.350–4.500)

## 2021-10-10 LAB — MAGNESIUM: Magnesium: 2.4 mg/dL (ref 1.7–2.4)

## 2021-10-10 MED ORDER — LACTATED RINGERS IV BOLUS
1000.0000 mL | Freq: Once | INTRAVENOUS | Status: AC
  Administered 2021-10-10: 18:00:00 1000 mL via INTRAVENOUS

## 2021-10-10 MED ORDER — TAMSULOSIN HCL 0.4 MG PO CAPS
0.4000 mg | ORAL_CAPSULE | Freq: Every day | ORAL | Status: DC
  Administered 2021-10-11: 01:00:00 0.4 mg via ORAL
  Filled 2021-10-10: qty 1

## 2021-10-10 MED ORDER — APIXABAN 2.5 MG PO TABS
2.5000 mg | ORAL_TABLET | Freq: Two times a day (BID) | ORAL | Status: DC
  Administered 2021-10-11: 01:00:00 2.5 mg via ORAL
  Filled 2021-10-10: qty 1

## 2021-10-10 MED ORDER — LEVOTHYROXINE SODIUM 50 MCG PO TABS: 75.0000 ug | ORAL_TABLET | Freq: Every day | ORAL | Status: DC

## 2021-10-10 MED ORDER — PANTOPRAZOLE SODIUM 40 MG PO TBEC
40.0000 mg | DELAYED_RELEASE_TABLET | Freq: Every evening | ORAL | Status: DC
  Administered 2021-10-11: 01:00:00 40 mg via ORAL
  Filled 2021-10-10: qty 1

## 2021-10-10 MED ORDER — ACETAMINOPHEN 500 MG PO TABS: 500.0000 mg | ORAL_TABLET | Freq: Four times a day (QID) | ORAL | Status: DC | PRN

## 2021-10-10 NOTE — ED Provider Notes (Signed)
[facility] DEPT Provider Note   CSN: [id number] Arrival date & time: 10/10/21  1517     History Chief Complaint  Patient presents with   Weakness    Covid positive    [name] is a 87 y.o. male.   Weakness Associated symptoms: no abdominal pain, no arthralgias, no chest pain, no cough, no diarrhea, no dizziness, no dysuria, no fever, no headaches, no myalgias, no nausea, no seizures, no shortness of breath and no vomiting   Patient presents from home via EMS.  Per chart review, his medical history is notable for COPD, atrial fibrillation, CAD, CHF, GERD, HTN, HLD.  Report by EMS was that he has been experiencing generalized weakness for the past week.  He was reportedly diagnosed positive for COVID yesterday.  Patient, himself, states that he has had ongoing lower back pain for the past 6 months.  This has caused him to utilize a walker for ambulation.  He denies any other current symptoms.  He denies any new or recently worsening symptoms.  He states that his breathing feels normal to him.  He has not had any recent nausea, vomiting, or diarrhea.    History per wife: Patient has had ongoing muscle spasms in back.  He has not able to do home rehab.  He has been losing weight.  He had a fall today.  There was fall, he landed on his background did not hit head. Fire department had to come help him get up.  They got him to bathroom and back in chair.  Both patient and his wife tested positive for covid yesterday.  He hasn't eaten today.  Patient's wife called PCP.  She spoke with nurse who advised IV hydration.    Past Medical History:  Diagnosis Date   Acute bronchitis with COPD ([place]) 09/10/2016   Anemia    Atrial fibrillation or flutter    maintaining sinus on amiodarone     Bladder cancer ([place]) dx'd 05/2012   BPH (benign prostatic hyperplasia)    CAD (coronary artery disease)     a. s/p anterior MI 12/05 c/b shock -> stent LAD   b. s/p  stenting OM-1, 2/06   CHF (congestive heart failure) ([place])    due to ischemic CM  a. EF 20-30%. (Nov 2008)   b. s/p St. Jude BiV-ICD    c. 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/16/2013   Major depression, single episode, in complete remission ([place]) 08/16/2016   Neuromuscular disorder ([place])    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"   no neurologist   Osteoarthritis 03/08/2011   Skin cancer    basal cells   facial x 4, 1 right forearm   Sleep apnea    STOP BANG SCORE 4    Patient Active Problem List   Diagnosis Date Noted   Hypotension    Abdominal distension (gaseous)    Influenza A 09/07/2021  Retained myringotomy tube in left ear 07/16/2021   Shoulder joint pain 07/16/2021   Allergic rhinitis 03/06/2020   Neck pain 03/06/2020   Visual disturbance 08/30/2019   Ingrown nail 01/11/2019   Encounter for screening for other disorder 08/27/2018   Other specified abnormal findings of blood chemistry 08/27/2018   Cough 06/11/2018   Presence of cardiac and vascular implant and graft, unspecified 12/22/2017   Sepsis due to Enterococcus ([place]) 12/22/2017   Ventricular tachycardia 12/22/2017   Enterococcal bacteremia 12/08/2017   Nausea & vomiting 12/06/2017   Renal insufficiency 12/06/2017   Low back pain 08/21/2017   Abnormal weight loss 07/29/2017   Abnormal feces 12/16/2016   Acute respiratory failure with hypoxia ([place]) 09/10/2016   Acute bronchitis with COPD ([place]) 09/10/2016   Major depression,  single episode, in complete remission ([place]) 08/16/2016   Heart failure ([place]) 08/01/2015   Hypertensive heart and chronic kidney disease with heart failure and stage 1 through stage 4 chronic kidney disease, or unspecified chronic kidney disease ([place]) 08/01/2015   Non-thrombocytopenic purpura ([place]) 08/01/2015   CKD (chronic kidney disease) stage 4, GFR 15-29 ml/min (HCC) 04/04/2015   Cardiac pacemaker in situ 03/24/2014   Generalized weakness 03/24/2014   Left ventricular lead failure to capture on the ring electrode 12/16/2013   Adult failure to thrive syndrome 08/20/2013   Thrombocytopenia ([place]) 08/20/2013   Urosepsis 08/16/2013   Anemia 05/21/2013   Basal cell carcinoma of skin 05/21/2013   Hyperglycemia 05/21/2013   Incontinence of feces 05/21/2013   Old myocardial infarction 05/21/2013   Tremor 05/21/2013   Bruising 08/12/2012   Bladder cancer ([place]) 07/08/2012   UTI (urinary tract infection) 05/27/2012   AKI (acute kidney injury) ([place]) 05/27/2012   Dehydration 05/27/2012   Bladder tumor 05/18/2012   Knee pain, acute, right 02/11/2012   Osteoarthritis 03/08/2011   CAROTID ARTERY DISEASE 12/06/2010   Peripheral vascular disease ([place]) 09/04/2010   Essential hypertension, benign 98/33/8250   Chronic systolic CHF (congestive heart failure) (HCC)-EF 25% 09/14/2009   CHEST PAIN 08/08/2009   Hypothyroidism 06/26/2009   Mixed hyperlipidemia 05/25/2009   Gastro-esophageal reflux disease without esophagitis 04/24/2009   Insomnia 04/24/2009   Polyneuropathy 04/24/2009   CAD, NATIVE VESSEL 02/01/2009   Ischemic cardiomyopathy 01/24/2009   Atrial fibrillation ([place]) 01/21/2009   ICD (implantable cardioverter-defibrillator) in place (St Jude pacer/ICD) 01/21/2009    Past Surgical History:  Procedure Laterality Date   BACK SURGERY  1972   lower back   BIV ICD GENERATOR CHANGEOUT N/A 10/02/2020   Procedure: BIV ICD GENERATOR CHANGEOUT;  Surgeon: Deboraha Sprang, MD;  Location: [facility];  Service: Cardiovascular;  Laterality: N/A;   CARDIAC DEFIBRILLATOR PLACEMENT     ICD St Jude; gen change 09-20-13   CERVICAL LAMINECTOMY  1985   COLONOSCOPY     CORONARY ANGIOPLASTY  2005,2006   CYSTOSCOPY W/ RETROGRADES  05/25/2012   Procedure: CYSTOSCOPY WITH RETROGRADE PYELOGRAM;  Surgeon: Molli Hazard, MD;  Location: [facility];  Service: Urology;  Laterality: Bilateral;   CYSTOSCOPY W/ URETERAL STENT PLACEMENT  07/08/2012   Procedure: CYSTOSCOPY WITH STENT REPLACEMENT;  Surgeon: Molli Hazard, MD;  Location: [facility];  Service: Urology;  Laterality: Left;   CYSTOSCOPY W/ URETERAL STENT REMOVAL  07/08/2012   Procedure: CYSTOSCOPY WITH STENT REMOVAL;  Surgeon: Molli Hazard, MD;  Location: [facility];  Service: Urology;  Laterality: Bilateral;   CYSTOSCOPY/RETROGRADE/URETEROSCOPY  07/08/2012   Procedure: CYSTOSCOPY/RETROGRADE/URETEROSCOPY;  Surgeon: Molli Hazard, MD;  Location: [facility];  Service: Urology;  Laterality: Left;   ESOPHAGOGASTRODUODENOSCOPY     HERNIA REPAIR  1989   bilateral   IMPLANTABLE CARDIOVERTER DEFIBRILLATOR (ICD) GENERATOR CHANGE N/A 09/20/2013   Procedure: ICD GENERATOR CHANGE;  Surgeon: Deboraha Sprang, MD;  Location: [facility] C[facility] LAB;  Service: Cardiovascular;  Laterality: N/A;   TEE WITHOUT CARDIOVERSION N/A 12/10/2017   Procedure: TRANSESOPHAGEAL ECHOCARDIOGRAM (TEE);  Surgeon: Jerline Pain, MD;  Location[facility];  Service: Cardiovascular;  Laterality: N/A;   TRANSURETHRAL RESECTION OF BLADDER TUMOR  05/25/2012   cold cup biopsy prostate   TRANSURETHRAL RESECTION OF BLADDER TUMOR  07/08/2012   Procedure: TRANSURETHRAL RESECTION OF BLADDER TUMOR (TURBT);  Surgeon: Molli Hazard, MD;  Location: [facility];  Service: Urology;  Laterality: N/A;  CYSTO, TURBT W/ GYRUS, BILATERAL STENT REMOVAL, LEFT URETEROSCOPY WITH BIOPSY, POSSIBLE LEFT STENT PLACEMENT        Family History  Problem Relation Age of Onset   Coronary artery  disease Other    Stroke Other     Social History   Tobacco Use   Smoking status: Former    Types: Cigarettes, Cigars    Quit date: 03/02/2004    Years since quitting: 17.6   Smokeless tobacco: Former    Quit date: 05/20/1999   Tobacco comments:    quit in 2005  Vaping Use   Vaping Use: Never used  Substance Use Topics   Alcohol use: No   Drug use: No    Home Medications Prior to Admission medications   Medication Sig Start Date End Date Taking? Authorizing Provider  acetaminophen (TYLENOL) 500 MG tablet Take 500-1,000 mg by mouth every 6 (six) hours as needed (for pain).    [provider]  apixaban (ELIQUIS) 2.5 MG TABS tablet Take 2.5 mg by mouth 2 (two) times daily. 06/10/14   [provider]  carboxymethylcellulose (REFRESH PLUS) 0.5 % SOLN Place 1 drop into both eyes 3 (three) times daily as needed (dry/irritated eyes.).    [provider]  carvedilol (COREG) 6.25 MG tablet Take 0.5 tablets (3.125 mg total) by mouth 2 (two) times daily with a meal. 09/09/21   Hongalgi, Lenis Dickinson, MD  cephALEXin (KEFLEX) 500 MG capsule Take 2,000 mg by mouth See admin instructions. Take 2,000 mg by mouth 45 minutes before dental procedures    [provider]  diazepam (VALIUM) 2 MG tablet Take 1 tablet (2 mg total) by mouth every 8 (eight) hours as needed for muscle spasms. 09/03/21   Blanchie Dessert, MD  ezetimibe (ZETIA) 10 MG tablet Take 10 mg by mouth daily with supper.    [provider]  FARXIGA 10 MG TABS tablet TAKE 1 TABLET BY MOUTH DAILY BEFORE BREAKFAST. Patient taking differently: 10 mg daily before breakfast. 08/30/21   Bensimhon, Shaune Pascal, MD  ferrous sulfate 325 (65 FE) MG tablet Take 325 mg by mouth daily with supper.    [provider]  furosemide (LASIX) 20 MG tablet Take 20 mg by mouth 2 (two) times daily.    [provider]  guaiFENesin (MUCINEX) 600 MG 12 hr tablet Take 1 tablet (600 mg total) by mouth 2 (two)  times daily. 09/09/21   Hongalgi, Lenis Dickinson, MD  KLOR-CON M10 10 MEQ tablet Take 10 mEq by mouth in the morning.    [provider]  lidocaine (LIDODERM) 5 % Place 1 patch onto the skin daily. 09/03/21   [provider]  Magnesium Hydroxide (MILK OF MAGNESIA PO) Take  20 mLs by mouth daily as needed (for constipation). 04/24/09   [provider]  Multiple Vitamin (MULTIVITAMIN WITH MINERALS) TABS tablet Take 1 tablet by mouth daily with supper. Centrum Silver    [provider]  nitroGLYCERIN (NITROSTAT) 0.4 MG SL tablet DISSOLVE ONE TABLET UNDER THE TONGUE EVERY 5 MINUTES AS NEEDED FOR CHEST PAIN.  DO NOT EXCEED A TOTAL OF 3 DOSES IN 15 MINUTES Patient taking differently: 0.4 mg every 5 (five) minutes as needed for chest pain (AND DO NOT EXCEED 3 DOSES IN 15 MINUTES). 12/26/16   Larey Dresser, MD  ondansetron (ZOFRAN) 4 MG tablet Take 4 mg by mouth 3 (three) times daily as needed for nausea or vomiting. 06/10/18   [provider]  pantoprazole (PROTONIX) 40 MG tablet Take 40 mg by mouth every evening. 01/15/11   [provider]  rosuvastatin (CRESTOR) 20 MG tablet Take 20 mg by mouth every evening.    [provider]  sodium chloride (OCEAN) 0.65 % SOLN nasal spray Place 1 spray into both nostrils as needed for congestion.    [provider]  SYNTHROID 75 MCG tablet Take 75 mcg by mouth daily before breakfast.    [provider]  tamsulosin (FLOMAX) 0.4 MG CAPS capsule Take 0.4 mg by mouth at bedtime.  11/23/13   [provider]  triamcinolone (NASACORT) 55 MCG/ACT AERO nasal inhaler USE 2 SPRAYS IN EACH NOSTRIL ONCE DAILY AT NIGHT Patient taking differently: 2 sprays daily as needed (for allergies or rhinitis). 02/26/21   Rozetta Nunnery, MD    Allergies    Gabapentin, Ramipril, Amoxicillin, Penicillin g sodium, and Penicillins  Review of Systems   Review of Systems  Constitutional:  Positive for appetite  change and fatigue. Negative for chills and fever.  HENT:  Negative for congestion, ear pain, rhinorrhea and sore throat.   Eyes:  Negative for pain and visual disturbance.  Respiratory:  Negative for cough, chest tightness, shortness of breath and wheezing.   Cardiovascular:  Negative for chest pain, palpitations and leg swelling.  Gastrointestinal:  Negative for abdominal pain, blood in stool, constipation, diarrhea, nausea and vomiting.  Genitourinary:  Negative for dysuria, flank pain and hematuria.  Musculoskeletal:  Positive for back pain (Chronic). Negative for arthralgias, joint swelling, myalgias and neck pain.  Skin:  Negative for color change and rash.  Neurological:  Positive for weakness (Generalized). Negative for dizziness, seizures, syncope, speech difficulty, light-headedness, numbness and headaches.  Psychiatric/Behavioral:  Negative for confusion and decreased concentration.   All other systems reviewed and are negative.  Physical Exam Updated Vital Signs BP 104/81    Pulse (!) 54    Temp 98.3 F (36.8 C) (Oral)    Resp 17    Ht 5\' 10"  (1.778 m)    Wt 61.2 kg    SpO2 97%    BMI 19.37 kg/m   Physical Exam Vitals and nursing note reviewed.  Constitutional:      General: He is not in acute distress.    Appearance: Normal appearance. He is well-developed. He is not ill-appearing, toxic-appearing or diaphoretic.  HENT:     Head: Normocephalic and atraumatic.     Right Ear: External ear normal.     Left Ear: External ear normal.     Nose: Nose normal.     Mouth/Throat:     Mouth: Mucous membranes are moist.     Pharynx: Oropharynx is clear.  Eyes:     General: No scleral  icterus.    Extraocular Movements: Extraocular movements intact.     Conjunctiva/sclera: Conjunctivae normal.  Cardiovascular:     Rate and Rhythm: Normal rate and regular rhythm.     Heart sounds: No murmur heard. Pulmonary:     Effort: Pulmonary effort is normal. No respiratory distress.      Breath sounds: Normal breath sounds. No wheezing or rales.  Chest:     Chest wall: No tenderness.  Abdominal:     General: Abdomen is flat.     Palpations: Abdomen is soft.     Tenderness: There is no abdominal tenderness.  Musculoskeletal:        General: No swelling, tenderness, deformity or signs of injury. Normal range of motion.     Cervical back: Normal range of motion and neck supple. No rigidity or tenderness.     Left lower leg: No edema.  Skin:    General: Skin is warm and dry.     Capillary Refill: Capillary refill takes less than 2 seconds.     Coloration: Skin is not jaundiced or pale.  Neurological:     General: No focal deficit present.     Mental Status: He is alert and oriented to person, place, and time.     Cranial Nerves: No cranial nerve deficit.     Sensory: No sensory deficit.     Motor: No weakness.     Coordination: Coordination normal.  Psychiatric:        Mood and Affect: Mood normal.        Behavior: Behavior normal.        Thought Content: Thought content normal.        Judgment: Judgment normal.    ED Results / Procedures / Treatments   Labs (all labs ordered are listed, but only abnormal results are displayed) Labs Reviewed  RESP PANEL BY RT-PCR (FLU A&B, COVID) ARPGX2 - Abnormal; Notable for the following components:      Result Value   SARS Coronavirus 2 by RT PCR POSITIVE (*)    All other components within normal limits  COMPREHENSIVE METABOLIC PANEL - Abnormal; Notable for the following components:   Glucose, Bld 121 (*)    BUN 34 (*)    Creatinine, Ser 2.27 (*)    Calcium 8.3 (*)    Albumin 3.1 (*)    GFR, Estimated 27 (*)    All other components within normal limits  CBC WITH DIFFERENTIAL/PLATELET - Abnormal; Notable for the following components:   Hemoglobin 12.5 (*)    All other components within normal limits  BRAIN NATRIURETIC PEPTIDE - Abnormal; Notable for the following components:   B Natriuretic Peptide 257.7 (*)    All  other components within normal limits  URINALYSIS, ROUTINE W REFLEX MICROSCOPIC - Abnormal; Notable for the following components:   Glucose, UA >=500 (*)    Bacteria, UA RARE (*)    All other components within normal limits  TROPONIN I (HIGH SENSITIVITY) - Abnormal; Notable for the following components:   Troponin I (High Sensitivity) 22 (*)    All other components within normal limits  TROPONIN I (HIGH SENSITIVITY) - Abnormal; Notable for the following components:   Troponin I (High Sensitivity) 19 (*)    All other components within normal limits  MAGNESIUM  TSH    EKG None  Radiology DG Chest Port 1 View  Result Date: 10/10/2021 CLINICAL DATA:  Cough EXAM: PORTABLE CHEST 1 VIEW COMPARISON:  09/07/2021 FINDINGS: Left-sided pacing device. Mild  cardiomegaly. No focal airspace disease, effusion or pneumothorax. Aortic atherosclerosis. IMPRESSION: Low lung volumes with mild cardiomegaly. Electronically Signed   By: Donavan Foil M.D.   On: 10/10/2021 18:06    Procedures Procedures   Medications Ordered in ED Medications  acetaminophen (TYLENOL) tablet 500-1,000 mg (has no administration in time range)  apixaban (ELIQUIS) tablet 2.5 mg (2.5 mg Oral Given 10/11/21 0054)  pantoprazole (PROTONIX) EC tablet 40 mg (40 mg Oral Given 10/11/21 0053)  tamsulosin (FLOMAX) capsule 0.4 mg (0.4 mg Oral Given 10/11/21 0053)  levothyroxine (SYNTHROID) tablet 75 mcg (has no administration in time range)  lactated ringers bolus 1,000 mL (0 mLs Intravenous Stopped 10/10/21 2100)    ED Course  I have reviewed the triage vital signs and the nursing notes.  Pertinent labs & imaging results that were available during my care of the patient were reviewed by me and considered in my medical decision making (see chart for details).    MDM Rules/Calculators/A&P                         Patient is 87 year old male who was diagnosed with COVID-19 yesterday, presenting for generalized weakness.  He has had  recent decreased appetite.  He denies associated nausea.  He has not had vomiting or diarrhea.  He did have a mechanical fall today.  He was ambulating with his walker when he got tripped up on a space heater.  He denies any suspected injuries or areas of pain since this fall.  He does have chronic lower back pain which she has had for the past 6 months.  He has been evaluated multiple times by medical providers for this.  He feels that the onset of this chronic low back pain has led to a decline in his mobility.  He has not noticed any focal areas of weakness.  Patient is well-appearing on exam.  He denies any recent shortness of breath.  His breathing is even and unlabored and his lungs are clear to auscultation.  He has no areas of tenderness or evidence of injury from his fall.  He has no focal neurologic deficits.  Work-up was initiated to assess for underlying factors to his recent decline.  Given his decreased p.o. intake, bolus of IV fluids was ordered.  COVID testing confirmed positivity.  Patient had no evidence of opacities on his chest x-ray.  EKG showed no changes from prior studies.  Troponin was mildly elevated with slight decrease on the delta.  BNP was lower than prior lab work.  Patient was observed in the ED for several hours.  He had no new complaints during this observation.  Patient's wife is concerned of his decreased mobility and increased risk of falls.  She does feel that he may benefit from a inpatient rehab facility.  Patient himself, however, does prefer to go home.  He was able to ambulate with a walker in the ED.  He stated that his ambulation is baseline.  Given that there is no clinical reason to admit the patient and his preference is to be discharged home, patient was discharged in stable condition.  Final Clinical Impression(s) / ED Diagnoses Final diagnoses:  Generalized weakness  COVID-19    Rx / DC Orders ED Discharge Orders     None        Godfrey Pick,  MD 10/11/21 [phone]

## 2021-10-10 NOTE — Progress Notes (Signed)
Remote ICD transmission.   

## 2021-10-10 NOTE — ED Triage Notes (Signed)
Pt presents to the ED via EMS from home for weakness x1 week. Pt tested positive for Covid x24hrs ago, per EMS. Pt has a productive cough, poor PO intake, and decreased mobility. Per EMS, pt has a health hx of CHF, "previous cardiac stents" and "past hx of sepsis" as well as renal failure, bladder cancer and has a pacemaker. Hx provided by EMS. Pt lives at home with his wife.

## 2021-10-10 NOTE — ED Notes (Signed)
Pt is aware urine sample is needed. Male purewick has been placed.

## 2021-10-10 NOTE — ED Notes (Signed)
Walked pt in room. Pt cannot walk without walker, did well with walker.

## 2021-10-11 DIAGNOSIS — I480 Paroxysmal atrial fibrillation: Secondary | ICD-10-CM | POA: Diagnosis not present

## 2021-10-11 DIAGNOSIS — Z743 Need for continuous supervision: Secondary | ICD-10-CM | POA: Diagnosis not present

## 2021-10-11 DIAGNOSIS — R3121 Asymptomatic microscopic hematuria: Secondary | ICD-10-CM | POA: Diagnosis not present

## 2021-10-11 DIAGNOSIS — N184 Chronic kidney disease, stage 4 (severe): Secondary | ICD-10-CM | POA: Diagnosis not present

## 2021-10-11 DIAGNOSIS — Z7401 Bed confinement status: Secondary | ICD-10-CM | POA: Diagnosis not present

## 2021-10-11 DIAGNOSIS — N400 Enlarged prostate without lower urinary tract symptoms: Secondary | ICD-10-CM | POA: Diagnosis not present

## 2021-10-11 DIAGNOSIS — R0902 Hypoxemia: Secondary | ICD-10-CM | POA: Diagnosis not present

## 2021-10-11 DIAGNOSIS — J208 Acute bronchitis due to other specified organisms: Secondary | ICD-10-CM | POA: Diagnosis not present

## 2021-10-11 DIAGNOSIS — J101 Influenza due to other identified influenza virus with other respiratory manifestations: Secondary | ICD-10-CM | POA: Diagnosis not present

## 2021-10-11 DIAGNOSIS — E039 Hypothyroidism, unspecified: Secondary | ICD-10-CM | POA: Diagnosis not present

## 2021-10-11 DIAGNOSIS — I2510 Atherosclerotic heart disease of native coronary artery without angina pectoris: Secondary | ICD-10-CM | POA: Diagnosis not present

## 2021-10-11 DIAGNOSIS — U071 COVID-19: Secondary | ICD-10-CM | POA: Diagnosis not present

## 2021-10-11 DIAGNOSIS — K219 Gastro-esophageal reflux disease without esophagitis: Secondary | ICD-10-CM | POA: Diagnosis not present

## 2021-10-11 DIAGNOSIS — Z87891 Personal history of nicotine dependence: Secondary | ICD-10-CM | POA: Diagnosis not present

## 2021-10-11 DIAGNOSIS — D631 Anemia in chronic kidney disease: Secondary | ICD-10-CM | POA: Diagnosis not present

## 2021-10-11 DIAGNOSIS — Z9181 History of falling: Secondary | ICD-10-CM | POA: Diagnosis not present

## 2021-10-11 DIAGNOSIS — M549 Dorsalgia, unspecified: Secondary | ICD-10-CM | POA: Diagnosis not present

## 2021-10-11 DIAGNOSIS — I959 Hypotension, unspecified: Secondary | ICD-10-CM | POA: Diagnosis not present

## 2021-10-11 DIAGNOSIS — I5022 Chronic systolic (congestive) heart failure: Secondary | ICD-10-CM | POA: Diagnosis not present

## 2021-10-11 DIAGNOSIS — I130 Hypertensive heart and chronic kidney disease with heart failure and stage 1 through stage 4 chronic kidney disease, or unspecified chronic kidney disease: Secondary | ICD-10-CM | POA: Diagnosis not present

## 2021-10-11 DIAGNOSIS — F325 Major depressive disorder, single episode, in full remission: Secondary | ICD-10-CM | POA: Diagnosis not present

## 2021-10-11 DIAGNOSIS — R251 Tremor, unspecified: Secondary | ICD-10-CM | POA: Diagnosis not present

## 2021-10-11 DIAGNOSIS — E782 Mixed hyperlipidemia: Secondary | ICD-10-CM | POA: Diagnosis not present

## 2021-10-11 DIAGNOSIS — G4730 Sleep apnea, unspecified: Secondary | ICD-10-CM | POA: Diagnosis not present

## 2021-10-11 DIAGNOSIS — M1990 Unspecified osteoarthritis, unspecified site: Secondary | ICD-10-CM | POA: Diagnosis not present

## 2021-10-11 DIAGNOSIS — J440 Chronic obstructive pulmonary disease with acute lower respiratory infection: Secondary | ICD-10-CM | POA: Diagnosis not present

## 2021-10-11 DIAGNOSIS — R531 Weakness: Secondary | ICD-10-CM | POA: Diagnosis not present

## 2021-10-11 NOTE — ED Notes (Signed)
Spoke with patient's wife. Patient requires a walker and she is not able to assist him to and from the car.

## 2021-10-12 DIAGNOSIS — I130 Hypertensive heart and chronic kidney disease with heart failure and stage 1 through stage 4 chronic kidney disease, or unspecified chronic kidney disease: Secondary | ICD-10-CM | POA: Diagnosis not present

## 2021-10-12 DIAGNOSIS — N184 Chronic kidney disease, stage 4 (severe): Secondary | ICD-10-CM | POA: Diagnosis not present

## 2021-10-12 DIAGNOSIS — J101 Influenza due to other identified influenza virus with other respiratory manifestations: Secondary | ICD-10-CM | POA: Diagnosis not present

## 2021-10-12 DIAGNOSIS — I5022 Chronic systolic (congestive) heart failure: Secondary | ICD-10-CM | POA: Diagnosis not present

## 2021-10-12 DIAGNOSIS — J208 Acute bronchitis due to other specified organisms: Secondary | ICD-10-CM | POA: Diagnosis not present

## 2021-10-12 DIAGNOSIS — J440 Chronic obstructive pulmonary disease with acute lower respiratory infection: Secondary | ICD-10-CM | POA: Diagnosis not present

## 2021-10-17 DIAGNOSIS — J208 Acute bronchitis due to other specified organisms: Secondary | ICD-10-CM | POA: Diagnosis not present

## 2021-10-17 DIAGNOSIS — J440 Chronic obstructive pulmonary disease with acute lower respiratory infection: Secondary | ICD-10-CM | POA: Diagnosis not present

## 2021-10-17 DIAGNOSIS — I5022 Chronic systolic (congestive) heart failure: Secondary | ICD-10-CM | POA: Diagnosis not present

## 2021-10-17 DIAGNOSIS — J101 Influenza due to other identified influenza virus with other respiratory manifestations: Secondary | ICD-10-CM | POA: Diagnosis not present

## 2021-10-17 DIAGNOSIS — I130 Hypertensive heart and chronic kidney disease with heart failure and stage 1 through stage 4 chronic kidney disease, or unspecified chronic kidney disease: Secondary | ICD-10-CM | POA: Diagnosis not present

## 2021-10-17 DIAGNOSIS — N184 Chronic kidney disease, stage 4 (severe): Secondary | ICD-10-CM | POA: Diagnosis not present

## 2021-10-19 DIAGNOSIS — I130 Hypertensive heart and chronic kidney disease with heart failure and stage 1 through stage 4 chronic kidney disease, or unspecified chronic kidney disease: Secondary | ICD-10-CM | POA: Diagnosis not present

## 2021-10-19 DIAGNOSIS — J440 Chronic obstructive pulmonary disease with acute lower respiratory infection: Secondary | ICD-10-CM | POA: Diagnosis not present

## 2021-10-19 DIAGNOSIS — J101 Influenza due to other identified influenza virus with other respiratory manifestations: Secondary | ICD-10-CM | POA: Diagnosis not present

## 2021-10-19 DIAGNOSIS — J208 Acute bronchitis due to other specified organisms: Secondary | ICD-10-CM | POA: Diagnosis not present

## 2021-10-19 DIAGNOSIS — I5022 Chronic systolic (congestive) heart failure: Secondary | ICD-10-CM | POA: Diagnosis not present

## 2021-10-19 DIAGNOSIS — N184 Chronic kidney disease, stage 4 (severe): Secondary | ICD-10-CM | POA: Diagnosis not present

## 2021-10-22 DIAGNOSIS — I5022 Chronic systolic (congestive) heart failure: Secondary | ICD-10-CM | POA: Diagnosis not present

## 2021-10-22 DIAGNOSIS — J101 Influenza due to other identified influenza virus with other respiratory manifestations: Secondary | ICD-10-CM | POA: Diagnosis not present

## 2021-10-22 DIAGNOSIS — J440 Chronic obstructive pulmonary disease with acute lower respiratory infection: Secondary | ICD-10-CM | POA: Diagnosis not present

## 2021-10-22 DIAGNOSIS — N184 Chronic kidney disease, stage 4 (severe): Secondary | ICD-10-CM | POA: Diagnosis not present

## 2021-10-22 DIAGNOSIS — I130 Hypertensive heart and chronic kidney disease with heart failure and stage 1 through stage 4 chronic kidney disease, or unspecified chronic kidney disease: Secondary | ICD-10-CM | POA: Diagnosis not present

## 2021-10-22 DIAGNOSIS — J208 Acute bronchitis due to other specified organisms: Secondary | ICD-10-CM | POA: Diagnosis not present

## 2021-10-24 ENCOUNTER — Other Ambulatory Visit: Payer: Self-pay

## 2021-10-24 ENCOUNTER — Ambulatory Visit (INDEPENDENT_AMBULATORY_CARE_PROVIDER_SITE_OTHER): Payer: Medicare Other | Admitting: Podiatry

## 2021-10-24 ENCOUNTER — Encounter: Payer: Self-pay | Admitting: Podiatry

## 2021-10-24 DIAGNOSIS — J440 Chronic obstructive pulmonary disease with acute lower respiratory infection: Secondary | ICD-10-CM | POA: Diagnosis not present

## 2021-10-24 DIAGNOSIS — M79674 Pain in right toe(s): Secondary | ICD-10-CM | POA: Diagnosis not present

## 2021-10-24 DIAGNOSIS — K921 Melena: Secondary | ICD-10-CM | POA: Insufficient documentation

## 2021-10-24 DIAGNOSIS — J101 Influenza due to other identified influenza virus with other respiratory manifestations: Secondary | ICD-10-CM | POA: Diagnosis not present

## 2021-10-24 DIAGNOSIS — L84 Corns and callosities: Secondary | ICD-10-CM | POA: Diagnosis not present

## 2021-10-24 DIAGNOSIS — I5022 Chronic systolic (congestive) heart failure: Secondary | ICD-10-CM | POA: Diagnosis not present

## 2021-10-24 DIAGNOSIS — B351 Tinea unguium: Secondary | ICD-10-CM

## 2021-10-24 DIAGNOSIS — R195 Other fecal abnormalities: Secondary | ICD-10-CM | POA: Insufficient documentation

## 2021-10-24 DIAGNOSIS — E46 Unspecified protein-calorie malnutrition: Secondary | ICD-10-CM | POA: Insufficient documentation

## 2021-10-24 DIAGNOSIS — J208 Acute bronchitis due to other specified organisms: Secondary | ICD-10-CM | POA: Diagnosis not present

## 2021-10-24 DIAGNOSIS — I130 Hypertensive heart and chronic kidney disease with heart failure and stage 1 through stage 4 chronic kidney disease, or unspecified chronic kidney disease: Secondary | ICD-10-CM | POA: Diagnosis not present

## 2021-10-24 DIAGNOSIS — E876 Hypokalemia: Secondary | ICD-10-CM | POA: Insufficient documentation

## 2021-10-24 DIAGNOSIS — N184 Chronic kidney disease, stage 4 (severe): Secondary | ICD-10-CM | POA: Diagnosis not present

## 2021-10-24 DIAGNOSIS — M79675 Pain in left toe(s): Secondary | ICD-10-CM | POA: Diagnosis not present

## 2021-10-24 DIAGNOSIS — G629 Polyneuropathy, unspecified: Secondary | ICD-10-CM | POA: Diagnosis not present

## 2021-10-24 DIAGNOSIS — M546 Pain in thoracic spine: Secondary | ICD-10-CM | POA: Insufficient documentation

## 2021-10-28 NOTE — Progress Notes (Signed)
Subjective: [name] is a 88 y.o. male patient seen today for follow up of  at risk foot care with history of peripheral neuropathy and painful elongated mycotic toenails 1-5 bilaterally which are tender when wearing enclosed shoe gear. Pain is relieved with periodic professional debridement..   New problems reported today: None.  Patient is accompanied by his wife on today's visit. They voice no new pedal problems on today's visit.  PCP is [name], MD. Last visit was: 09/28/2021.  Allergies  Allergen Reactions   Gabapentin Itching   Ramipril Cough   Amoxicillin Hives and Rash   Penicillin G Sodium Itching and Rash   Penicillins Itching and Rash    Has patient had a PCN reaction causing immediate rash, facial/tongue/throat swelling, SOB or lightheadedness with hypotension: No Has patient had a PCN reaction causing severe rash involving mucus membranes or skin necrosis: Yes-back and hands. Has patient had a PCN reaction that required hospitalization: No Has patient had a PCN reaction occurring within the last 10 years: Yes If all of the above answers are "NO", then may proceed with Cephalosporin use.    Objective: Physical Exam  General: Patient is a pleasant 88 y.o. Caucasian male WD, WN in NAD. AAO x 3.   Neurovascular Examination: CFT <3 seconds b/l LE. Faintly palpable pedal pulses b/l LE. Pedal hair sparse b/l LE. Skin temperature gradient WNL b/l. No pain with calf compression b/l. No edema b/l LE. No cyanosis or clubbing noted b/l LE.  Pt has subjective symptoms of neuropathy. Protective sensation intact 5/5 intact bilaterally with 10g monofilament b/l. Vibratory sensation diminished b/l.  Dermatological:  Pedal skin thin, shiny and atrophic b/l LE.  No open wounds b/l. No interdigital macerations b/l. Toenails 1-5 b/l elongated, thickened, discolored with subungual debris. +Tenderness with dorsal palpation of nailplates. Hyperkeratotic lesion(s) noted plantar  heel pad of right foot, R hallux, and submet head 1 b/l.   Musculoskeletal:  Normal muscle strength 5/5 to all lower extremity muscle groups bilaterally. HAV with bunion deformity noted b/l LE.Marland Kitchen No pain, crepitus or joint limitation noted with ROM b/l LE.  Patient ambulates independently without assistive aids.  Assessment: 1. Pain due to onychomycosis of toenails of both feet   2. Callus   3. Peripheral polyneuropathy    Plan: Patient was evaluated and treated and all questions answered. Consent given for treatment as described below: -Toenails 1-5 bilaterally were debrided in length and girth with sterile nail nippers and dremel. Pinpoint bleeding of L hallux and L 3rd toe addressed with Lumicain Hemostatic Solution, cleansed with alcohol. triple antibiotic ointment applied.  -Callus(es) plantar heel pad of right foot, R hallux, and submet head 1 b/l pared utilizing sterile scalpel blade without complication or incident. Total number debrided =4. -Patient/POA to call should there be question/concern in the interim.  Return in about 3 months (around 01/22/2022).  Marzetta Board, DPM

## 2021-10-29 DIAGNOSIS — N184 Chronic kidney disease, stage 4 (severe): Secondary | ICD-10-CM | POA: Diagnosis not present

## 2021-10-29 DIAGNOSIS — I130 Hypertensive heart and chronic kidney disease with heart failure and stage 1 through stage 4 chronic kidney disease, or unspecified chronic kidney disease: Secondary | ICD-10-CM | POA: Diagnosis not present

## 2021-10-29 DIAGNOSIS — J101 Influenza due to other identified influenza virus with other respiratory manifestations: Secondary | ICD-10-CM | POA: Diagnosis not present

## 2021-10-29 DIAGNOSIS — I5022 Chronic systolic (congestive) heart failure: Secondary | ICD-10-CM | POA: Diagnosis not present

## 2021-10-29 DIAGNOSIS — J440 Chronic obstructive pulmonary disease with acute lower respiratory infection: Secondary | ICD-10-CM | POA: Diagnosis not present

## 2021-10-29 DIAGNOSIS — J208 Acute bronchitis due to other specified organisms: Secondary | ICD-10-CM | POA: Diagnosis not present

## 2021-10-31 DIAGNOSIS — J208 Acute bronchitis due to other specified organisms: Secondary | ICD-10-CM | POA: Diagnosis not present

## 2021-10-31 DIAGNOSIS — I130 Hypertensive heart and chronic kidney disease with heart failure and stage 1 through stage 4 chronic kidney disease, or unspecified chronic kidney disease: Secondary | ICD-10-CM | POA: Diagnosis not present

## 2021-10-31 DIAGNOSIS — J440 Chronic obstructive pulmonary disease with acute lower respiratory infection: Secondary | ICD-10-CM | POA: Diagnosis not present

## 2021-10-31 DIAGNOSIS — J101 Influenza due to other identified influenza virus with other respiratory manifestations: Secondary | ICD-10-CM | POA: Diagnosis not present

## 2021-10-31 DIAGNOSIS — I5022 Chronic systolic (congestive) heart failure: Secondary | ICD-10-CM | POA: Diagnosis not present

## 2021-10-31 DIAGNOSIS — N184 Chronic kidney disease, stage 4 (severe): Secondary | ICD-10-CM | POA: Diagnosis not present

## 2021-11-05 DIAGNOSIS — J208 Acute bronchitis due to other specified organisms: Secondary | ICD-10-CM | POA: Diagnosis not present

## 2021-11-05 DIAGNOSIS — I5022 Chronic systolic (congestive) heart failure: Secondary | ICD-10-CM | POA: Diagnosis not present

## 2021-11-05 DIAGNOSIS — I130 Hypertensive heart and chronic kidney disease with heart failure and stage 1 through stage 4 chronic kidney disease, or unspecified chronic kidney disease: Secondary | ICD-10-CM | POA: Diagnosis not present

## 2021-11-05 DIAGNOSIS — N184 Chronic kidney disease, stage 4 (severe): Secondary | ICD-10-CM | POA: Diagnosis not present

## 2021-11-05 DIAGNOSIS — J440 Chronic obstructive pulmonary disease with acute lower respiratory infection: Secondary | ICD-10-CM | POA: Diagnosis not present

## 2021-11-05 DIAGNOSIS — J101 Influenza due to other identified influenza virus with other respiratory manifestations: Secondary | ICD-10-CM | POA: Diagnosis not present

## 2021-11-07 DIAGNOSIS — I5022 Chronic systolic (congestive) heart failure: Secondary | ICD-10-CM | POA: Diagnosis not present

## 2021-11-07 DIAGNOSIS — J440 Chronic obstructive pulmonary disease with acute lower respiratory infection: Secondary | ICD-10-CM | POA: Diagnosis not present

## 2021-11-07 DIAGNOSIS — I130 Hypertensive heart and chronic kidney disease with heart failure and stage 1 through stage 4 chronic kidney disease, or unspecified chronic kidney disease: Secondary | ICD-10-CM | POA: Diagnosis not present

## 2021-11-07 DIAGNOSIS — N184 Chronic kidney disease, stage 4 (severe): Secondary | ICD-10-CM | POA: Diagnosis not present

## 2021-11-07 DIAGNOSIS — J208 Acute bronchitis due to other specified organisms: Secondary | ICD-10-CM | POA: Diagnosis not present

## 2021-11-07 DIAGNOSIS — J101 Influenza due to other identified influenza virus with other respiratory manifestations: Secondary | ICD-10-CM | POA: Diagnosis not present

## 2021-11-10 DIAGNOSIS — R251 Tremor, unspecified: Secondary | ICD-10-CM | POA: Diagnosis not present

## 2021-11-10 DIAGNOSIS — I5022 Chronic systolic (congestive) heart failure: Secondary | ICD-10-CM | POA: Diagnosis not present

## 2021-11-10 DIAGNOSIS — J449 Chronic obstructive pulmonary disease, unspecified: Secondary | ICD-10-CM | POA: Diagnosis not present

## 2021-11-10 DIAGNOSIS — I130 Hypertensive heart and chronic kidney disease with heart failure and stage 1 through stage 4 chronic kidney disease, or unspecified chronic kidney disease: Secondary | ICD-10-CM | POA: Diagnosis not present

## 2021-11-10 DIAGNOSIS — M1990 Unspecified osteoarthritis, unspecified site: Secondary | ICD-10-CM | POA: Diagnosis not present

## 2021-11-10 DIAGNOSIS — K219 Gastro-esophageal reflux disease without esophagitis: Secondary | ICD-10-CM | POA: Diagnosis not present

## 2021-11-10 DIAGNOSIS — I480 Paroxysmal atrial fibrillation: Secondary | ICD-10-CM | POA: Diagnosis not present

## 2021-11-10 DIAGNOSIS — M549 Dorsalgia, unspecified: Secondary | ICD-10-CM | POA: Diagnosis not present

## 2021-11-10 DIAGNOSIS — F325 Major depressive disorder, single episode, in full remission: Secondary | ICD-10-CM | POA: Diagnosis not present

## 2021-11-10 DIAGNOSIS — D631 Anemia in chronic kidney disease: Secondary | ICD-10-CM | POA: Diagnosis not present

## 2021-11-10 DIAGNOSIS — E782 Mixed hyperlipidemia: Secondary | ICD-10-CM | POA: Diagnosis not present

## 2021-11-10 DIAGNOSIS — E039 Hypothyroidism, unspecified: Secondary | ICD-10-CM | POA: Diagnosis not present

## 2021-11-10 DIAGNOSIS — N184 Chronic kidney disease, stage 4 (severe): Secondary | ICD-10-CM | POA: Diagnosis not present

## 2021-11-10 DIAGNOSIS — N400 Enlarged prostate without lower urinary tract symptoms: Secondary | ICD-10-CM | POA: Diagnosis not present

## 2021-11-10 DIAGNOSIS — I959 Hypotension, unspecified: Secondary | ICD-10-CM | POA: Diagnosis not present

## 2021-11-10 DIAGNOSIS — G4730 Sleep apnea, unspecified: Secondary | ICD-10-CM | POA: Diagnosis not present

## 2021-11-10 DIAGNOSIS — Z87891 Personal history of nicotine dependence: Secondary | ICD-10-CM | POA: Diagnosis not present

## 2021-11-10 DIAGNOSIS — Z9181 History of falling: Secondary | ICD-10-CM | POA: Diagnosis not present

## 2021-11-10 DIAGNOSIS — I2510 Atherosclerotic heart disease of native coronary artery without angina pectoris: Secondary | ICD-10-CM | POA: Diagnosis not present

## 2021-11-12 DIAGNOSIS — I5022 Chronic systolic (congestive) heart failure: Secondary | ICD-10-CM | POA: Diagnosis not present

## 2021-11-12 DIAGNOSIS — M549 Dorsalgia, unspecified: Secondary | ICD-10-CM | POA: Diagnosis not present

## 2021-11-12 DIAGNOSIS — N184 Chronic kidney disease, stage 4 (severe): Secondary | ICD-10-CM | POA: Diagnosis not present

## 2021-11-12 DIAGNOSIS — I130 Hypertensive heart and chronic kidney disease with heart failure and stage 1 through stage 4 chronic kidney disease, or unspecified chronic kidney disease: Secondary | ICD-10-CM | POA: Diagnosis not present

## 2021-11-12 DIAGNOSIS — D631 Anemia in chronic kidney disease: Secondary | ICD-10-CM | POA: Diagnosis not present

## 2021-11-12 DIAGNOSIS — J449 Chronic obstructive pulmonary disease, unspecified: Secondary | ICD-10-CM | POA: Diagnosis not present

## 2021-11-15 DIAGNOSIS — I130 Hypertensive heart and chronic kidney disease with heart failure and stage 1 through stage 4 chronic kidney disease, or unspecified chronic kidney disease: Secondary | ICD-10-CM | POA: Diagnosis not present

## 2021-11-15 DIAGNOSIS — I5022 Chronic systolic (congestive) heart failure: Secondary | ICD-10-CM | POA: Diagnosis not present

## 2021-11-15 DIAGNOSIS — M549 Dorsalgia, unspecified: Secondary | ICD-10-CM | POA: Diagnosis not present

## 2021-11-15 DIAGNOSIS — N184 Chronic kidney disease, stage 4 (severe): Secondary | ICD-10-CM | POA: Diagnosis not present

## 2021-11-15 DIAGNOSIS — D631 Anemia in chronic kidney disease: Secondary | ICD-10-CM | POA: Diagnosis not present

## 2021-11-15 DIAGNOSIS — J449 Chronic obstructive pulmonary disease, unspecified: Secondary | ICD-10-CM | POA: Diagnosis not present

## 2021-11-21 DIAGNOSIS — J449 Chronic obstructive pulmonary disease, unspecified: Secondary | ICD-10-CM | POA: Diagnosis not present

## 2021-11-21 DIAGNOSIS — I5022 Chronic systolic (congestive) heart failure: Secondary | ICD-10-CM | POA: Diagnosis not present

## 2021-11-21 DIAGNOSIS — I130 Hypertensive heart and chronic kidney disease with heart failure and stage 1 through stage 4 chronic kidney disease, or unspecified chronic kidney disease: Secondary | ICD-10-CM | POA: Diagnosis not present

## 2021-11-21 DIAGNOSIS — D631 Anemia in chronic kidney disease: Secondary | ICD-10-CM | POA: Diagnosis not present

## 2021-11-21 DIAGNOSIS — M549 Dorsalgia, unspecified: Secondary | ICD-10-CM | POA: Diagnosis not present

## 2021-11-21 DIAGNOSIS — N184 Chronic kidney disease, stage 4 (severe): Secondary | ICD-10-CM | POA: Diagnosis not present

## 2021-11-24 DIAGNOSIS — D631 Anemia in chronic kidney disease: Secondary | ICD-10-CM | POA: Diagnosis not present

## 2021-11-24 DIAGNOSIS — I130 Hypertensive heart and chronic kidney disease with heart failure and stage 1 through stage 4 chronic kidney disease, or unspecified chronic kidney disease: Secondary | ICD-10-CM | POA: Diagnosis not present

## 2021-11-24 DIAGNOSIS — N184 Chronic kidney disease, stage 4 (severe): Secondary | ICD-10-CM | POA: Diagnosis not present

## 2021-11-24 DIAGNOSIS — M549 Dorsalgia, unspecified: Secondary | ICD-10-CM | POA: Diagnosis not present

## 2021-11-24 DIAGNOSIS — J449 Chronic obstructive pulmonary disease, unspecified: Secondary | ICD-10-CM | POA: Diagnosis not present

## 2021-11-24 DIAGNOSIS — I5022 Chronic systolic (congestive) heart failure: Secondary | ICD-10-CM | POA: Diagnosis not present

## 2021-11-26 DIAGNOSIS — D631 Anemia in chronic kidney disease: Secondary | ICD-10-CM | POA: Diagnosis not present

## 2021-11-26 DIAGNOSIS — N184 Chronic kidney disease, stage 4 (severe): Secondary | ICD-10-CM | POA: Diagnosis not present

## 2021-11-26 DIAGNOSIS — I130 Hypertensive heart and chronic kidney disease with heart failure and stage 1 through stage 4 chronic kidney disease, or unspecified chronic kidney disease: Secondary | ICD-10-CM | POA: Diagnosis not present

## 2021-11-26 DIAGNOSIS — J449 Chronic obstructive pulmonary disease, unspecified: Secondary | ICD-10-CM | POA: Diagnosis not present

## 2021-11-26 DIAGNOSIS — I5022 Chronic systolic (congestive) heart failure: Secondary | ICD-10-CM | POA: Diagnosis not present

## 2021-11-26 DIAGNOSIS — M549 Dorsalgia, unspecified: Secondary | ICD-10-CM | POA: Diagnosis not present

## 2021-11-28 DIAGNOSIS — J449 Chronic obstructive pulmonary disease, unspecified: Secondary | ICD-10-CM | POA: Diagnosis not present

## 2021-11-28 DIAGNOSIS — N184 Chronic kidney disease, stage 4 (severe): Secondary | ICD-10-CM | POA: Diagnosis not present

## 2021-11-28 DIAGNOSIS — I130 Hypertensive heart and chronic kidney disease with heart failure and stage 1 through stage 4 chronic kidney disease, or unspecified chronic kidney disease: Secondary | ICD-10-CM | POA: Diagnosis not present

## 2021-11-28 DIAGNOSIS — M549 Dorsalgia, unspecified: Secondary | ICD-10-CM | POA: Diagnosis not present

## 2021-11-28 DIAGNOSIS — D631 Anemia in chronic kidney disease: Secondary | ICD-10-CM | POA: Diagnosis not present

## 2021-11-28 DIAGNOSIS — I5022 Chronic systolic (congestive) heart failure: Secondary | ICD-10-CM | POA: Diagnosis not present

## 2021-12-03 DIAGNOSIS — I5022 Chronic systolic (congestive) heart failure: Secondary | ICD-10-CM | POA: Diagnosis not present

## 2021-12-03 DIAGNOSIS — N184 Chronic kidney disease, stage 4 (severe): Secondary | ICD-10-CM | POA: Diagnosis not present

## 2021-12-03 DIAGNOSIS — I130 Hypertensive heart and chronic kidney disease with heart failure and stage 1 through stage 4 chronic kidney disease, or unspecified chronic kidney disease: Secondary | ICD-10-CM | POA: Diagnosis not present

## 2021-12-03 DIAGNOSIS — J449 Chronic obstructive pulmonary disease, unspecified: Secondary | ICD-10-CM | POA: Diagnosis not present

## 2021-12-03 DIAGNOSIS — D631 Anemia in chronic kidney disease: Secondary | ICD-10-CM | POA: Diagnosis not present

## 2021-12-03 DIAGNOSIS — M549 Dorsalgia, unspecified: Secondary | ICD-10-CM | POA: Diagnosis not present

## 2021-12-04 DIAGNOSIS — I5022 Chronic systolic (congestive) heart failure: Secondary | ICD-10-CM | POA: Diagnosis not present

## 2021-12-04 DIAGNOSIS — E039 Hypothyroidism, unspecified: Secondary | ICD-10-CM | POA: Diagnosis not present

## 2021-12-04 DIAGNOSIS — E785 Hyperlipidemia, unspecified: Secondary | ICD-10-CM | POA: Diagnosis not present

## 2021-12-04 DIAGNOSIS — I130 Hypertensive heart and chronic kidney disease with heart failure and stage 1 through stage 4 chronic kidney disease, or unspecified chronic kidney disease: Secondary | ICD-10-CM | POA: Diagnosis not present

## 2021-12-04 DIAGNOSIS — J449 Chronic obstructive pulmonary disease, unspecified: Secondary | ICD-10-CM | POA: Diagnosis not present

## 2021-12-04 DIAGNOSIS — I480 Paroxysmal atrial fibrillation: Secondary | ICD-10-CM | POA: Diagnosis not present

## 2021-12-04 DIAGNOSIS — K5900 Constipation, unspecified: Secondary | ICD-10-CM | POA: Diagnosis not present

## 2021-12-04 DIAGNOSIS — I700 Atherosclerosis of aorta: Secondary | ICD-10-CM | POA: Diagnosis not present

## 2021-12-04 DIAGNOSIS — I2510 Atherosclerotic heart disease of native coronary artery without angina pectoris: Secondary | ICD-10-CM | POA: Diagnosis not present

## 2021-12-04 DIAGNOSIS — N184 Chronic kidney disease, stage 4 (severe): Secondary | ICD-10-CM | POA: Diagnosis not present

## 2021-12-04 DIAGNOSIS — E46 Unspecified protein-calorie malnutrition: Secondary | ICD-10-CM | POA: Diagnosis not present

## 2021-12-04 DIAGNOSIS — K921 Melena: Secondary | ICD-10-CM | POA: Diagnosis not present

## 2021-12-05 ENCOUNTER — Telehealth: Payer: Self-pay | Admitting: Internal Medicine

## 2021-12-05 DIAGNOSIS — J449 Chronic obstructive pulmonary disease, unspecified: Secondary | ICD-10-CM | POA: Diagnosis not present

## 2021-12-05 DIAGNOSIS — I130 Hypertensive heart and chronic kidney disease with heart failure and stage 1 through stage 4 chronic kidney disease, or unspecified chronic kidney disease: Secondary | ICD-10-CM | POA: Diagnosis not present

## 2021-12-05 DIAGNOSIS — N184 Chronic kidney disease, stage 4 (severe): Secondary | ICD-10-CM | POA: Diagnosis not present

## 2021-12-05 DIAGNOSIS — M549 Dorsalgia, unspecified: Secondary | ICD-10-CM | POA: Diagnosis not present

## 2021-12-05 DIAGNOSIS — I5022 Chronic systolic (congestive) heart failure: Secondary | ICD-10-CM | POA: Diagnosis not present

## 2021-12-05 DIAGNOSIS — D631 Anemia in chronic kidney disease: Secondary | ICD-10-CM | POA: Diagnosis not present

## 2021-12-05 NOTE — Telephone Encounter (Signed)
°  1. Has your device fired? No   2. Is you device beeping? No   3. Are you experiencing draining or swelling at device site? Patient has a sore place wife states almost like a lesion at the device sight that has been there since his last appointment has not worsened but still appears irritated   4. Are you calling to see if we received your device transmission? No   5. Have you passed out? No     Please route to [place]

## 2021-12-05 NOTE — Telephone Encounter (Signed)
Spoke with patient's wife.  She states that patient was due for an appt with Dr. Caryl Comes and they were calling to schedule that appt.  The sore that patient has has been there since both Dr. Caryl Comes and one of the Pas has seen it.  It has not changed in appearance.  She is not able to take a picture (no smart phone)  She mentioned the sore as a need to get a sooner appt with Dr. Caryl Comes.  Advised earliest appt with Dr. Caryl Comes is May which is hwy they are scheduled with Oda Kilts in March.  She felt they can wait for that appt, if they get access to a smart phone she will call for an email address.

## 2021-12-10 DIAGNOSIS — J449 Chronic obstructive pulmonary disease, unspecified: Secondary | ICD-10-CM | POA: Diagnosis not present

## 2021-12-10 DIAGNOSIS — N400 Enlarged prostate without lower urinary tract symptoms: Secondary | ICD-10-CM | POA: Diagnosis not present

## 2021-12-10 DIAGNOSIS — I130 Hypertensive heart and chronic kidney disease with heart failure and stage 1 through stage 4 chronic kidney disease, or unspecified chronic kidney disease: Secondary | ICD-10-CM | POA: Diagnosis not present

## 2021-12-10 DIAGNOSIS — D631 Anemia in chronic kidney disease: Secondary | ICD-10-CM | POA: Diagnosis not present

## 2021-12-10 DIAGNOSIS — I959 Hypotension, unspecified: Secondary | ICD-10-CM | POA: Diagnosis not present

## 2021-12-10 DIAGNOSIS — I2510 Atherosclerotic heart disease of native coronary artery without angina pectoris: Secondary | ICD-10-CM | POA: Diagnosis not present

## 2021-12-10 DIAGNOSIS — E782 Mixed hyperlipidemia: Secondary | ICD-10-CM | POA: Diagnosis not present

## 2021-12-10 DIAGNOSIS — M1990 Unspecified osteoarthritis, unspecified site: Secondary | ICD-10-CM | POA: Diagnosis not present

## 2021-12-10 DIAGNOSIS — F325 Major depressive disorder, single episode, in full remission: Secondary | ICD-10-CM | POA: Diagnosis not present

## 2021-12-10 DIAGNOSIS — M549 Dorsalgia, unspecified: Secondary | ICD-10-CM | POA: Diagnosis not present

## 2021-12-10 DIAGNOSIS — E039 Hypothyroidism, unspecified: Secondary | ICD-10-CM | POA: Diagnosis not present

## 2021-12-10 DIAGNOSIS — G4730 Sleep apnea, unspecified: Secondary | ICD-10-CM | POA: Diagnosis not present

## 2021-12-10 DIAGNOSIS — I480 Paroxysmal atrial fibrillation: Secondary | ICD-10-CM | POA: Diagnosis not present

## 2021-12-10 DIAGNOSIS — R251 Tremor, unspecified: Secondary | ICD-10-CM | POA: Diagnosis not present

## 2021-12-10 DIAGNOSIS — Z87891 Personal history of nicotine dependence: Secondary | ICD-10-CM | POA: Diagnosis not present

## 2021-12-10 DIAGNOSIS — K219 Gastro-esophageal reflux disease without esophagitis: Secondary | ICD-10-CM | POA: Diagnosis not present

## 2021-12-10 DIAGNOSIS — I5022 Chronic systolic (congestive) heart failure: Secondary | ICD-10-CM | POA: Diagnosis not present

## 2021-12-10 DIAGNOSIS — Z9181 History of falling: Secondary | ICD-10-CM | POA: Diagnosis not present

## 2021-12-10 DIAGNOSIS — N184 Chronic kidney disease, stage 4 (severe): Secondary | ICD-10-CM | POA: Diagnosis not present

## 2021-12-20 DIAGNOSIS — J449 Chronic obstructive pulmonary disease, unspecified: Secondary | ICD-10-CM | POA: Diagnosis not present

## 2021-12-20 DIAGNOSIS — I130 Hypertensive heart and chronic kidney disease with heart failure and stage 1 through stage 4 chronic kidney disease, or unspecified chronic kidney disease: Secondary | ICD-10-CM | POA: Diagnosis not present

## 2021-12-20 DIAGNOSIS — M549 Dorsalgia, unspecified: Secondary | ICD-10-CM | POA: Diagnosis not present

## 2021-12-20 DIAGNOSIS — I5022 Chronic systolic (congestive) heart failure: Secondary | ICD-10-CM | POA: Diagnosis not present

## 2021-12-20 DIAGNOSIS — D631 Anemia in chronic kidney disease: Secondary | ICD-10-CM | POA: Diagnosis not present

## 2021-12-20 DIAGNOSIS — N184 Chronic kidney disease, stage 4 (severe): Secondary | ICD-10-CM | POA: Diagnosis not present

## 2021-12-25 DIAGNOSIS — D631 Anemia in chronic kidney disease: Secondary | ICD-10-CM | POA: Diagnosis not present

## 2021-12-25 DIAGNOSIS — N184 Chronic kidney disease, stage 4 (severe): Secondary | ICD-10-CM | POA: Diagnosis not present

## 2021-12-25 DIAGNOSIS — I130 Hypertensive heart and chronic kidney disease with heart failure and stage 1 through stage 4 chronic kidney disease, or unspecified chronic kidney disease: Secondary | ICD-10-CM | POA: Diagnosis not present

## 2021-12-25 DIAGNOSIS — J449 Chronic obstructive pulmonary disease, unspecified: Secondary | ICD-10-CM | POA: Diagnosis not present

## 2021-12-25 DIAGNOSIS — I5022 Chronic systolic (congestive) heart failure: Secondary | ICD-10-CM | POA: Diagnosis not present

## 2021-12-25 DIAGNOSIS — M549 Dorsalgia, unspecified: Secondary | ICD-10-CM | POA: Diagnosis not present

## 2021-12-31 ENCOUNTER — Ambulatory Visit (INDEPENDENT_AMBULATORY_CARE_PROVIDER_SITE_OTHER): Payer: Medicare Other

## 2021-12-31 DIAGNOSIS — I255 Ischemic cardiomyopathy: Secondary | ICD-10-CM

## 2021-12-31 LAB — CUP PACEART REMOTE DEVICE CHECK
Battery Remaining Longevity: 57 mo
Battery Remaining Percentage: 75 %
Battery Voltage: 2.96 V
Brady Statistic AP VP Percent: 79 %
Brady Statistic AP VS Percent: 2 %
Brady Statistic AS VP Percent: 15 %
Brady Statistic AS VS Percent: 3.5 %
Brady Statistic RA Percent Paced: 75 %
Date Time Interrogation Session: 20230320020018
HighPow Impedance: 49 Ohm
HighPow Impedance: 50 Ohm
Implantable Lead Implant Date: 20060509
Implantable Lead Implant Date: 20060509
Implantable Lead Implant Date: 20090417
Implantable Lead Location: 753858
Implantable Lead Location: 753859
Implantable Lead Location: 753860
Implantable Lead Model: 5076
Implantable Lead Model: 7001
Implantable Pulse Generator Implant Date: 20211220
Lead Channel Impedance Value: 400 Ohm
Lead Channel Impedance Value: 450 Ohm
Lead Channel Impedance Value: 450 Ohm
Lead Channel Pacing Threshold Amplitude: 0.875 V
Lead Channel Pacing Threshold Amplitude: 0.875 V
Lead Channel Pacing Threshold Amplitude: 1 V
Lead Channel Pacing Threshold Pulse Width: 0.5 ms
Lead Channel Pacing Threshold Pulse Width: 0.8 ms
Lead Channel Pacing Threshold Pulse Width: 0.8 ms
Lead Channel Sensing Intrinsic Amplitude: 12 mV
Lead Channel Sensing Intrinsic Amplitude: 3 mV
Lead Channel Setting Pacing Amplitude: 2 V
Lead Channel Setting Pacing Amplitude: 2 V
Lead Channel Setting Pacing Amplitude: 2 V
Lead Channel Setting Pacing Pulse Width: 0.8 ms
Lead Channel Setting Pacing Pulse Width: 0.8 ms
Lead Channel Setting Sensing Sensitivity: 0.5 mV
Pulse Gen Serial Number: 9905796

## 2022-01-02 DIAGNOSIS — D631 Anemia in chronic kidney disease: Secondary | ICD-10-CM | POA: Diagnosis not present

## 2022-01-02 DIAGNOSIS — M549 Dorsalgia, unspecified: Secondary | ICD-10-CM | POA: Diagnosis not present

## 2022-01-02 DIAGNOSIS — N184 Chronic kidney disease, stage 4 (severe): Secondary | ICD-10-CM | POA: Diagnosis not present

## 2022-01-02 DIAGNOSIS — I5022 Chronic systolic (congestive) heart failure: Secondary | ICD-10-CM | POA: Diagnosis not present

## 2022-01-02 DIAGNOSIS — I130 Hypertensive heart and chronic kidney disease with heart failure and stage 1 through stage 4 chronic kidney disease, or unspecified chronic kidney disease: Secondary | ICD-10-CM | POA: Diagnosis not present

## 2022-01-02 DIAGNOSIS — J449 Chronic obstructive pulmonary disease, unspecified: Secondary | ICD-10-CM | POA: Diagnosis not present

## 2022-01-03 DIAGNOSIS — I129 Hypertensive chronic kidney disease with stage 1 through stage 4 chronic kidney disease, or unspecified chronic kidney disease: Secondary | ICD-10-CM | POA: Diagnosis not present

## 2022-01-03 DIAGNOSIS — N184 Chronic kidney disease, stage 4 (severe): Secondary | ICD-10-CM | POA: Diagnosis not present

## 2022-01-03 DIAGNOSIS — I509 Heart failure, unspecified: Secondary | ICD-10-CM | POA: Diagnosis not present

## 2022-01-03 DIAGNOSIS — D472 Monoclonal gammopathy: Secondary | ICD-10-CM | POA: Diagnosis not present

## 2022-01-08 ENCOUNTER — Encounter: Payer: Medicare Other | Admitting: Student

## 2022-01-09 NOTE — Progress Notes (Signed)
Remote ICD transmission.   

## 2022-01-11 ENCOUNTER — Encounter: Payer: Self-pay | Admitting: Podiatry

## 2022-01-11 ENCOUNTER — Ambulatory Visit (INDEPENDENT_AMBULATORY_CARE_PROVIDER_SITE_OTHER): Payer: Medicare Other | Admitting: Podiatry

## 2022-01-11 DIAGNOSIS — M79675 Pain in left toe(s): Secondary | ICD-10-CM | POA: Diagnosis not present

## 2022-01-11 DIAGNOSIS — G629 Polyneuropathy, unspecified: Secondary | ICD-10-CM | POA: Diagnosis not present

## 2022-01-11 DIAGNOSIS — B351 Tinea unguium: Secondary | ICD-10-CM

## 2022-01-11 DIAGNOSIS — M79674 Pain in right toe(s): Secondary | ICD-10-CM

## 2022-01-11 DIAGNOSIS — L84 Corns and callosities: Secondary | ICD-10-CM

## 2022-01-14 ENCOUNTER — Ambulatory Visit: Payer: Medicare Other | Admitting: Podiatry

## 2022-01-17 ENCOUNTER — Encounter: Payer: Self-pay | Admitting: Internal Medicine

## 2022-01-17 ENCOUNTER — Ambulatory Visit (INDEPENDENT_AMBULATORY_CARE_PROVIDER_SITE_OTHER): Payer: Medicare Other | Admitting: Internal Medicine

## 2022-01-17 VITALS — BP 104/54 | HR 63 | Ht 70.0 in | Wt 165.0 lb

## 2022-01-17 DIAGNOSIS — I5022 Chronic systolic (congestive) heart failure: Secondary | ICD-10-CM

## 2022-01-17 DIAGNOSIS — I255 Ischemic cardiomyopathy: Secondary | ICD-10-CM | POA: Diagnosis not present

## 2022-01-17 DIAGNOSIS — I480 Paroxysmal atrial fibrillation: Secondary | ICD-10-CM | POA: Diagnosis not present

## 2022-01-17 DIAGNOSIS — I472 Ventricular tachycardia, unspecified: Secondary | ICD-10-CM | POA: Diagnosis not present

## 2022-01-17 DIAGNOSIS — Z95810 Presence of automatic (implantable) cardiac defibrillator: Secondary | ICD-10-CM

## 2022-01-17 NOTE — Patient Instructions (Signed)

## 2022-01-17 NOTE — Progress Notes (Signed)
?  Subjective:  ?Patient ID: [name], male    DOB: 10-26-1931,  MRN: [id number] ? ?[name] presents to clinic today for at risk foot care with history of peripheral neuropathy and callus(es) b/l lower extremities and painful thick toenails that are difficult to trim. Painful toenails interfere with ambulation. Aggravating factors include wearing enclosed shoe gear. Pain is relieved with periodic professional debridement. Painful calluses are aggravated when weightbearing with and without shoegear. Pain is relieved with periodic professional debridement. ? ?Patient is accompanied by his wife on today's visit. They voice no new pedal concerns ? ?New problem(s): None.  ? ?PCP is [name], MD , and last visit was December 31, 2021. ? ?Allergies  ?Allergen Reactions  ? Amoxicillin Hives and Rash  ? Gabapentin Itching  ? Penicillin G Sodium Itching and Rash  ? Penicillins Itching and Rash  ?  Has patient had a PCN reaction causing immediate rash, facial/tongue/throat swelling, SOB or lightheadedness with hypotension: No ?Has patient had a PCN reaction causing severe rash involving mucus membranes or skin necrosis: Yes-back and hands. ?Has patient had a PCN reaction that required hospitalization: No ?Has patient had a PCN reaction occurring within the last 10 years: Yes ?If all of the above answers are "NO", then may proceed with Cephalosporin use.  ? Ramipril Cough  ? ? ?Review of Systems: Negative except as noted in the HPI. ? ?Objective: No changes noted in today's physical examination. ?General: Patient is a pleasant 88 y.o. Caucasian male WD, WN in NAD. AAO x 3.  ? ?Neurovascular Examination: ?CFT <3 seconds b/l LE. Faintly palpable pedal pulses b/l LE. Pedal hair sparse b/l LE. Skin temperature gradient WNL b/l. No pain with calf compression b/l. No edema b/l LE. No cyanosis or clubbing noted b/l LE. ? ?Pt has subjective symptoms of neuropathy. Protective sensation intact 5/5 intact bilaterally  with 10g monofilament b/l. Vibratory sensation diminished b/l. ? ?Dermatological:  ?Pedal skin thin, shiny and atrophic b/l LE.  No open wounds b/l. No interdigital macerations b/l. Toenails 1-5 b/l elongated, thickened, discolored with subungual debris. +Tenderness with dorsal palpation of nailplates. Incurvated nailplate medial border left hallux and medial border right hallux with tenderness to palpation. No erythema, no edema, no drainage noted. No fluctuance. Hyperkeratotic lesion(s) plantar heel pad of right foot, R hallux, and submet head 1 b/l with tenderness to palpation. No edema, no erythema, no drainage, no fluctuance.  ? ?Musculoskeletal:  ?Normal muscle strength 5/5 to all lower extremity muscle groups bilaterally. HAV with bunion deformity noted b/l LE.Marland Kitchen No pain, crepitus or joint limitation noted with ROM b/l LE.  Patient ambulates with cane assistance on today's visit. ? ? ?Assessment/Plan: ?1. Pain due to onychomycosis of toenails of both feet   ?2. Callus   ?3. Peripheral polyneuropathy   ?-Patient was evaluated and treated. All patient's and/or POA's questions/concerns answered on today's visit. ?-Mycotic toenails 1-5 bilaterally were debrided in length and girth with sterile nail nippers and dremel without incident. ?-Offending nail border debrided and curretaged bilateral great toes utilizing sterile nail nipper and currette. Border cleansed with alcohol and triple antibiotic applied. No further treatment required by patient/caregiver. ?-Callus(es) plantar heel pad of right foot, R hallux, and submet head 1 b/l pared utilizing sterile scalpel blade without complication or incident. Total number debrided =4. ?-Patient/POA to call should there be question/concern in the interim.  ? ?Return in about 3 months (around 04/12/2022). ? ?Marzetta Board, DPM   ?

## 2022-01-17 NOTE — Progress Notes (Signed)
Symptoms since ? ? ? ? ? ?Patient Care Team: ?[name], MD as PCP - General (Internal Medicine) ?Bensimhon, Shaune Pascal, MD as Consulting Physician (Cardiology) ? ? ?HPI ? ?[name] is a 88 y.o. male seen in followup for ischemic heart myopathy congestive heart failure and prior CRT-D with generator replaced 12/14; 12/21.  ? ?  ?Recurrent  atrial fibrillation. Managed with amiodarone -- had problems with neurotoxicity with gait instability and his amiodarone  decreased>>and then stopped 2/2 tremor  ?Resumed 2020 for recurrent afib and had tolerated.  However, it was stopped at the heart failure clinic 5/22  no clinical afib ((See Below))  ? ?  ? ?The patient denies chest pain, shortness of breath, nocturnal dyspnea, orthopnea or peripheral edema.  There have been no palpitations, lightheadedness or syncope.  Complains of persistent issues with back pain.  Better than they were 3 months ago.  On narcotics and heating pads.  ? ?Date Cr K Hgb  ?4/17 2.5      ?6/19     ?2/20 2.49 3.6 13.1  ?11/20 2.2  12.6  ?6/21 2.49 4.5 12.8  ?12/21 2.52 4.1 13.7  ?12/22 2.27 3.6 12.5  ? ?  ? ?DATE TEST EF   ?12/14 Echo   20-25 %   ?2/19 Echo  25%   ?     ? ? ? ?Thromboembolic risk factors ( age  -2, Vasc disease -1, CHF-1) for a CHADSVASc Score of >=4   ? ?   ? ?Past Medical History:  ?Diagnosis Date  ? Acute bronchitis with COPD ([place]) 09/10/2016  ? Anemia   ? Atrial fibrillation or flutter   ? maintaining sinus on amiodarone    ? Bladder cancer ([place]) dx'd 05/2012  ? BPH (benign prostatic hyperplasia)   ? CAD (coronary artery disease)   ?  a. s/p anterior MI 12/05 c/b shock -> stent LAD   b. s/p stenting OM-1, 2/06  ? CHF (congestive heart failure) ([place])   ? due to ischemic CM  a. EF 20-30%. (Nov 2008)   b. s/p St[employer] Jude BiV-ICD    c. CPX 07/2008  pvo2 16.3 (63% predicted) slope 34 RER 1.08 O2 pulse 93%  ? Cough   ? occasional /productive, no fever/ not new  ? CRI (chronic renal insufficiency)   ? (baseline 2.0-2.2)/ recent  hospitalization 8/13  ? GERD (gastroesophageal reflux disease)   ? hx Barretts esophagitis  ? History of blood transfusion   ? following coumadin  usage  ? HTN (hypertension)   ? EKG 05/14/12,Chest x ray 8/13, last ICD interrogation 8/13 EPIC  ? Hyperlipidemia   ? Hypothyroidism   ? 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 Major depression, single episode, in complete remission ([place]) 08/16/2016  ? Neuromuscular disorder ([place])   ? 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 Osteoarthritis 03/08/2011  ? Skin cancer   ? basal cells   facial x 4, 1 right forearm  ? Sleep apnea   ? STOP BANG SCORE 4  ? ? ?Past Surgical History:  ?Procedure Laterality Date  ? [place]  ? lower back  ? BIV ICD GENERATOR CHANGEOUT N/A 10/02/2020  ? Procedure: BIV ICD GENERATOR CHANGEOUT;  Surgeon: Deboraha Sprang, MD;  Location: [facility];  Service: Cardiovascular;  Laterality: N/A;  ? CARDIAC DEFIBRILLATOR PLACEMENT    ? ICD St Jude; gen change 09-20-13  ? CERVICAL  LAMINECTOMY  1985  ? COLONOSCOPY    ? CORONARY ANGIOPLASTY  2005,2006  ? CYSTOSCOPY W/ RETROGRADES  05/25/2012  ? Procedure: CYSTOSCOPY WITH RETROGRADE PYELOGRAM;  Surgeon: Molli Hazard, MD;  Location: [facility];  Service: Urology;  Laterality: Bilateral;  ? CYSTOSCOPY W/ URETERAL STENT PLACEMENT  07/08/2012  ? Procedure: CYSTOSCOPY WITH STENT REPLACEMENT;  Surgeon: Molli Hazard, MD;  Location: [facility];  Service: Urology;  Laterality: Left;  ? CYSTOSCOPY W/ URETERAL STENT REMOVAL  07/08/2012  ? Procedure: CYSTOSCOPY WITH STENT REMOVAL;  Surgeon: Molli Hazard, MD;  Location: [facility];  Service: Urology;  Laterality: Bilateral;  ? CYSTOSCOPY/RETROGRADE/URETEROSCOPY  07/08/2012  ? Procedure: CYSTOSCOPY/RETROGRADE/URETEROSCOPY;  Surgeon: Molli Hazard, MD;  Location: [facility];  Service: Urology;  Laterality: Left;  ? ESOPHAGOGASTRODUODENOSCOPY    ? HERNIA REPAIR  1989  ? bilateral  ? IMPLANTABLE  CARDIOVERTER DEFIBRILLATOR (ICD) GENERATOR CHANGE N/A 09/20/2013  ? Procedure: ICD GENERATOR CHANGE;  Surgeon: Deboraha Sprang, MD;  Location[facility];  Service: Cardiovascular;  Laterality: N/A;  ? TEE WITHOUT CARDIOVERSION N/A 12/10/2017  ? Procedure: TRANSESOPHAGEAL ECHOCARDIOGRAM (TEE);  Surgeon: Jerline Pain, MD;  Location: [facility];  Service: Cardiovascular;  Laterality: N/A;  ? TRANSURETHRAL RESECTION OF BLADDER TUMOR  05/25/2012  ? cold cup biopsy prostate  ? TRANSURETHRAL RESECTION OF BLADDER TUMOR  07/08/2012  ? Procedure: TRANSURETHRAL RESECTION OF BLADDER TUMOR (TURBT);  Surgeon: Molli Hazard, MD;  Location: [facility];  Service: Urology;  Laterality: N/A;  CYSTO, TURBT W/ GYRUS, BILATERAL STENT REMOVAL, LEFT URETEROSCOPY WITH BIOPSY, POSSIBLE LEFT STENT PLACEMENT ?  ? ? ?Current Outpatient Medications  ?Medication Sig Dispense Refill  ? acetaminophen (TYLENOL) 500 MG tablet Take 500-1,000 mg by mouth every 6 (six) hours as needed (for pain).    ? apixaban (ELIQUIS) 2.5 MG TABS tablet Take 2.5 mg by mouth 2 (two) times daily.    ? carboxymethylcellulose (REFRESH PLUS) 0.5 % SOLN Place 1 drop into both eyes 3 (three) times daily as needed (dry/irritated eyes.).    ? carvedilol (COREG) 6.25 MG tablet Take 0.5 tablets (3.125 mg total) by mouth 2 (two) times daily with a meal. 30 tablet 1  ? cephALEXin (KEFLEX) 500 MG capsule Take 2,000 mg by mouth See admin instructions. Take 2,000 mg by mouth 45 minutes before dental procedures    ? ezetimibe (ZETIA) 10 MG tablet Take 10 mg by mouth daily with supper.    ? FARXIGA 10 MG TABS tablet TAKE 1 TABLET BY MOUTH DAILY BEFORE BREAKFAST. 90 tablet 3  ? ferrous sulfate 325 (65 FE) MG tablet Take 325 mg by mouth daily with supper.    ? furosemide (LASIX) 20 MG tablet Take 20 mg by mouth 2 (two) times daily.    ? guaiFENesin (MUCINEX) 600 MG 12 hr tablet Take 1 tablet (600 mg total) by mouth 2 (two) times daily. (Patient taking differently: Take 600 mg by  mouth 2 (two) times daily as needed.) 20 tablet 0  ? KLOR-CON M10 10 MEQ tablet Take 10 mEq by mouth in the morning.    ? Multiple Vitamin (MULTIVITAMIN WITH MINERALS) TABS tablet Take 1 tablet by mouth daily with supper. Centrum Silver    ? nitroGLYCERIN (NITROSTAT) 0.4 MG SL tablet DISSOLVE ONE TABLET UNDER THE TONGUE EVERY 5 MINUTES AS NEEDED FOR CHEST PAIN.  DO NOT EXCEED A TOTAL OF 3 DOSES IN 15 MINUTES (Patient taking differently: 0.4 mg every 5 (five) minutes as needed for chest  pain (AND DO NOT EXCEED 3 DOSES IN 15 MINUTES).) 25 tablet 0  ? ondansetron (ZOFRAN) 4 MG tablet Take 4 mg by mouth 3 (three) times daily as needed for nausea or vomiting.    ? pantoprazole (PROTONIX) 40 MG tablet Take 40 mg by mouth every evening.    ? rosuvastatin (CRESTOR) 20 MG tablet Take 20 mg by mouth every evening.    ? sodium chloride (OCEAN) 0.65 % SOLN nasal spray Place 1 spray into both nostrils as needed for congestion.    ? SYNTHROID 75 MCG tablet Take 75 mcg by mouth daily before breakfast.    ? tamsulosin (FLOMAX) 0.4 MG CAPS capsule Take 0.4 mg by mouth at bedtime.     ? triamcinolone (NASACORT ALLERGY 24HR) 55 MCG/ACT AERO nasal inhaler Place 2 sprays into the nose daily as needed.    ? polyethylene glycol powder (MIRALAX) 17 GM/SCOOP powder as needed.    ? traMADol (ULTRAM) 50 MG tablet Take 50 mg by mouth 2 (two) times daily as needed.    ? ?No current facility-administered medications for this visit.  ? ? ?Allergies  ?Allergen Reactions  ? Amoxicillin Hives and Rash  ? Gabapentin Itching  ? Penicillin G Sodium Itching and Rash  ? Penicillins Itching and Rash  ?  Has patient had a PCN reaction causing immediate rash, facial/tongue/throat swelling, SOB or lightheadedness with hypotension: No ?Has patient had a PCN reaction causing severe rash involving mucus membranes or skin necrosis: Yes-back and hands. ?Has patient had a PCN reaction that required hospitalization: No ?Has patient had a PCN reaction occurring  within the last 10 years: Yes ?If all of the above answers are "NO", then may proceed with Cephalosporin use.  ? Ramipril Cough  ? ? ?Review of Systems negative except from HPI and PMH ? ?Physical Exam ?BP (!) 104

## 2022-01-30 ENCOUNTER — Ambulatory Visit: Payer: Medicare Other | Admitting: Podiatry

## 2022-02-27 ENCOUNTER — Telehealth: Payer: Self-pay

## 2022-02-27 NOTE — Telephone Encounter (Signed)
Alert from [employer]:  ? ?Repeat Abbott alert for  AF ?Presenting rhythm AF/BiV pace/VS ?BiV pacing <threshold, 61% ?Known PAF, Burden 64%, Eliquis ? ?Spoke with patient regarding increase in AF burden offered patient AF clinic visit to discuss getting back into normal rhythm patient agreeable informed him I would send this message over to AF clinic to reach out to him to make appointment. Patient voiced understanding  ? ? ? ? ? ?

## 2022-03-05 ENCOUNTER — Encounter (HOSPITAL_COMMUNITY): Payer: Self-pay | Admitting: Nurse Practitioner

## 2022-03-05 ENCOUNTER — Ambulatory Visit (HOSPITAL_COMMUNITY)
Admission: RE | Admit: 2022-03-05 | Discharge: 2022-03-05 | Disposition: A | Discharge status: Home / Self Care | Payer: Medicare Other | Source: Ambulatory Visit | Attending: Nurse Practitioner | Admitting: Nurse Practitioner

## 2022-03-05 VITALS — BP 114/68 | HR 78 | Ht 70.0 in | Wt 165.2 lb

## 2022-03-05 DIAGNOSIS — I4891 Unspecified atrial fibrillation: Secondary | ICD-10-CM

## 2022-03-05 DIAGNOSIS — I509 Heart failure, unspecified: Secondary | ICD-10-CM | POA: Diagnosis not present

## 2022-03-05 DIAGNOSIS — D6869 Other thrombophilia: Secondary | ICD-10-CM

## 2022-03-05 DIAGNOSIS — I110 Hypertensive heart disease with heart failure: Secondary | ICD-10-CM | POA: Insufficient documentation

## 2022-03-05 DIAGNOSIS — I4819 Other persistent atrial fibrillation: Secondary | ICD-10-CM | POA: Insufficient documentation

## 2022-03-05 DIAGNOSIS — Z950 Presence of cardiac pacemaker: Secondary | ICD-10-CM | POA: Diagnosis not present

## 2022-03-05 DIAGNOSIS — Z7901 Long term (current) use of anticoagulants: Secondary | ICD-10-CM | POA: Insufficient documentation

## 2022-03-05 NOTE — Progress Notes (Signed)
Primary Care Physician: [name], MD Referring Physician: Dr. [facility]   [name] is a 88 y.o. male with a h/o  of afib, CHF, HTN that was on amiodarone in the past, stopped due to S.E.'s, that is in the afib clinic per device clinic for persistent  afib since April. Dr. Caryl Comes saw the pt in April and by his note, he planned to have rate control as pt's strategy going forward.   In the clinic today, pt is sedentary and has not noted afib or feeling poorly. He is doing what he wants to do without symptoms. He is rate controlled. He is on eliquis for a CHA2DS2VASc score of at least 5. Weight is stable.  Today, he denies symptoms of palpitations, chest pain, shortness of breath, orthopnea, PND, lower extremity edema, dizziness, presyncope, syncope, or neurologic sequela. The patient is tolerating medications without difficulties and is otherwise without complaint today.   Past Medical History:  Diagnosis Date   Acute bronchitis with COPD ([place]) 09/10/2016   Anemia    Atrial fibrillation or flutter    maintaining sinus on amiodarone     Bladder cancer ([place]) dx'd 05/2012   BPH (benign prostatic hyperplasia)    CAD (coronary artery disease)     a. s/p anterior MI 12/05 c/b shock -> stent LAD   b. s/p stenting OM-1, 2/06   CHF (congestive heart failure) ([place])    due to ischemic CM  a. EF 20-30%. (Nov 2008)   b. s/p St. Jude BiV-ICD    c. 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/16/2013    Major depression, single episode, in complete remission ([place]) 08/16/2016   Neuromuscular disorder ([place])    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"   no neurologist   Osteoarthritis 03/08/2011   Skin cancer    basal cells   facial x 4, 1 right forearm   Sleep apnea    STOP BANG SCORE 4   Past Surgical History:  Procedure Laterality Date   BACK SURGERY  1972   lower back   BIV ICD GENERATOR CHANGEOUT N/A 10/02/2020   Procedure: BIV ICD GENERATOR CHANGEOUT;  Surgeon: Deboraha Sprang, MD;  Location: [facility];  Service: Cardiovascular;  Laterality: N/A;   CARDIAC DEFIBRILLATOR PLACEMENT     ICD St Jude; gen change 09-20-13   CERVICAL LAMINECTOMY  1985   COLONOSCOPY     CORONARY ANGIOPLASTY  2005,2006   CYSTOSCOPY W/ RETROGRADES  05/25/2012   Procedure: CYSTOSCOPY WITH RETROGRADE PYELOGRAM;  Surgeon: Molli Hazard, MD;  Location: [facility];  Service: Urology;  Laterality: Bilateral;   CYSTOSCOPY W/ URETERAL STENT PLACEMENT  07/08/2012   Procedure: CYSTOSCOPY WITH STENT REPLACEMENT;  Surgeon: Molli Hazard, MD;  Location: [facility];  Service: Urology;  Laterality: Left;   CYSTOSCOPY W/ URETERAL STENT REMOVAL  07/08/2012   Procedure: CYSTOSCOPY WITH STENT REMOVAL;  Surgeon: Molli Hazard, MD;  Location: [facility];  Service: Urology;  Laterality: Bilateral;   CYSTOSCOPY/RETROGRADE/URETEROSCOPY  07/08/2012   Procedure: CYSTOSCOPY/RETROGRADE/URETEROSCOPY;  Surgeon: Molli Hazard, MD;  Location: [facility];  Service: Urology;  Laterality: Left;   ESOPHAGOGASTRODUODENOSCOPY     HERNIA REPAIR  1989   bilateral   IMPLANTABLE CARDIOVERTER DEFIBRILLATOR (ICD) GENERATOR CHANGE N/A 09/20/2013   Procedure: ICD GENERATOR CHANGE;  Surgeon: Deboraha Sprang, MD;  Location: [facility] CATH LAB;  Service: Cardiovascular;  Laterality: N/A;   TEE WITHOUT CARDIOVERSION N/A 12/10/2017   Procedure: TRANSESOPHAGEAL ECHOCARDIOGRAM (TEE);  Surgeon: Jerline Pain, MD;  Location: [facility];  Service:  Cardiovascular;  Laterality: N/A;   TRANSURETHRAL RESECTION OF BLADDER TUMOR  05/25/2012   cold cup biopsy prostate   TRANSURETHRAL RESECTION OF BLADDER TUMOR  07/08/2012   Procedure: TRANSURETHRAL RESECTION OF BLADDER TUMOR (TURBT);  Surgeon: Molli Hazard, MD;  Location: [facility];  Service: Urology;  Laterality: N/A;  CYSTO, TURBT W/ GYRUS, BILATERAL STENT REMOVAL, LEFT URETEROSCOPY WITH BIOPSY, POSSIBLE LEFT STENT PLACEMENT     Current Outpatient Medications  Medication Sig Dispense Refill   acetaminophen (TYLENOL) 500 MG tablet Take 500-1,000 mg by mouth every 6 (six) hours as needed (for pain).     apixaban (ELIQUIS) 2.5 MG TABS tablet Take 2.5 mg by mouth 2 (two) times daily.     carboxymethylcellulose (REFRESH PLUS) 0.5 % SOLN Place 1 drop into both eyes 3 (three) times daily as needed (dry/irritated eyes.).     carvedilol (COREG) 6.25 MG tablet Take 0.5 tablets (3.125 mg total) by mouth 2 (two) times daily with a meal. 30 tablet 1   cephALEXin (KEFLEX) 500 MG capsule Take 2,000 mg by mouth See admin instructions. Take 2,000 mg by mouth 45 minutes before dental procedures     ezetimibe (ZETIA) 10 MG tablet Take 10 mg by mouth daily with supper.     FARXIGA 10 MG TABS tablet TAKE 1 TABLET BY MOUTH DAILY BEFORE BREAKFAST. 90 tablet 3   ferrous sulfate 325 (65 FE) MG tablet Take 325 mg by mouth daily with supper.     furosemide (LASIX) 20 MG tablet Take 20 mg by mouth 2 (two) times daily.     guaiFENesin (MUCINEX) 600 MG 12 hr tablet Take 1 tablet (600 mg total) by mouth 2 (two) times daily. (Patient taking differently: Take 600 mg by mouth 2 (two) times daily as needed.) 20 tablet 0   KLOR-CON M10 10 MEQ tablet Take 10 mEq by mouth in the morning.     Multiple Vitamin (MULTIVITAMIN WITH MINERALS) TABS tablet Take 1 tablet by mouth daily with supper. Centrum Silver     nitroGLYCERIN (NITROSTAT) 0.4 MG SL tablet DISSOLVE ONE TABLET UNDER THE TONGUE EVERY 5 MINUTES AS NEEDED FOR CHEST  PAIN.  DO NOT EXCEED A TOTAL OF 3 DOSES IN 15 MINUTES (Patient taking differently: 0.4 mg every 5 (five) minutes as needed for chest pain (AND DO NOT EXCEED 3 DOSES IN 15 MINUTES).) 25 tablet 0   ondansetron (ZOFRAN) 4 MG tablet Take 4 mg by mouth 3 (three) times daily as needed for nausea or vomiting.     pantoprazole (PROTONIX) 40 MG tablet Take 40 mg by mouth every evening.     polyethylene glycol powder (GLYCOLAX/MIRALAX) 17 GM/SCOOP powder as needed.     rosuvastatin (CRESTOR) 20 MG tablet Take 20 mg by mouth every evening.     sodium chloride (OCEAN) 0.65 % SOLN nasal spray Place 1 spray into both nostrils as  needed for congestion.     SYNTHROID 75 MCG tablet Take 75 mcg by mouth daily before breakfast.     tamsulosin (FLOMAX) 0.4 MG CAPS capsule Take 0.4 mg by mouth at bedtime.      traMADol (ULTRAM) 50 MG tablet Take 50 mg by mouth 2 (two) times daily as needed.     triamcinolone (NASACORT) 55 MCG/ACT AERO nasal inhaler Place 2 sprays into the nose daily as needed.     No current facility-administered medications for this encounter.    Allergies  Allergen Reactions   Amoxicillin Hives and Rash   Gabapentin Itching   Penicillin G Sodium Itching and Rash   Penicillins Itching and Rash    Has patient had a PCN reaction causing immediate rash, facial/tongue/throat swelling, SOB or lightheadedness with hypotension: No Has patient had a PCN reaction causing severe rash involving mucus membranes or skin necrosis: Yes-back and hands. Has patient had a PCN reaction that required hospitalization: No Has patient had a PCN reaction occurring within the last 10 years: Yes If all of the above answers are "NO", then may proceed with Cephalosporin use.   Ramipril Cough    Social History   Socioeconomic History   Marital status: Married    Spouse name: Not on file   Number of children: Not on file   Years of education: Not on file   Highest education level: Not on file  Occupational  History   Occupation: retired  Tobacco Use   Smoking status: Former    Types: Cigarettes, Cigars    Quit date: 03/02/2004    Years since quitting: 18.0   Smokeless tobacco: Former    Quit date: 05/20/1999   Tobacco comments:    quit in 2005  Vaping Use   Vaping Use: Never used  Substance and Sexual Activity   Alcohol use: No   Drug use: No   Sexual activity: Not on file  Other Topics Concern   Not on file  Social History Narrative   Not on file   Social Determinants of Health   Financial Resource Strain: Not on file  Food Insecurity: Not on file  Transportation Needs: Not on file  Physical Activity: Not on file  Stress: Not on file  Social Connections: Not on file  Intimate Partner Violence: Not on file    Family History  Problem Relation Age of Onset   Coronary artery disease Other    Stroke Other     ROS- All systems are reviewed and negative except as per the HPI above  Physical Exam: Vitals:   03/05/22 1039  BP: 114/68  Pulse: 78  Weight: 74.9 kg  Height: '5\' 10"'$  (1.778 m)   Wt Readings from Last 3 Encounters:  03/05/22 74.9 kg  01/17/22 74.8 kg  10/10/21 61.2 kg    Labs: Lab Results  Component Value Date   NA 135 10/10/2021   K 3.6 10/10/2021   CL 101 10/10/2021   CO2 27 10/10/2021   GLUCOSE 121 (H) 10/10/2021   BUN 34 (H) 10/10/2021   CREATININE 2.27 (H) 10/10/2021   CALCIUM 8.3 (L) 10/10/2021   PHOS 2.9 12/10/2017   MG 2.4 10/10/2021   Lab Results  Component Value Date   INR 1.17 12/06/2017   Lab Results  Component Value Date   CHOL 90 04/25/2008   HDL 29.3 (L) 04/25/2008   LDLCALC 47 04/25/2008   TRIG 67 04/25/2008     GEN- The patient is well appearing, alert and  oriented x 3 today.   Head- normocephalic, atraumatic Eyes-  Sclera clear, conjunctiva pink Ears- hearing intact Oropharynx- clear Neck- supple, no JVP Lymph- no cervical lymphadenopathy Lungs- Clear to ausculation bilaterally, normal work of breathing Heart-  irregular rate and rhythm, no murmurs, rubs or gallops, PMI not laterally displaced GI- soft, NT, ND, + BS Extremities- no clubbing, cyanosis, or edema MS- no significant deformity or atrophy Skin- no rash or lesion Psych- euthymic mood, full affect Neuro- strength and sensation are intact  EKG-Vent. rate 78 BPM PR interval * ms QRS duration 128 ms QT/QTcB 426/485 ms P-R-T axes * 262 78 Atrial fibrillation with frequent ventricular-paced complexes Right superior axis deviation Non-specific intra-ventricular conduction block Nonspecific T wave abnormality Abnormal ECG When compared with ECG of 10-Oct-2021 17:31, atrial pacing not seen Confirmed by Patwardhan, Manish ([id number]) on 03/05/2022 12:32:29 PM    Assessment and Plan:  1. Afib Persistent now since April Pt is rate controlled and asymptomatic Amiodarone was used in the past but stopped in the past for neurotoxicity  Discussed with Dr. Caryl Comes Pt is rate controlled, asymptomatic and normovolemic He will continue with rate control as his strategy  going forward  Pt is very happy to hear this as he feels well  His renal function would be an issue with Tikosyn  Continue carvedilol at 6.25 mg bid      2. CHA2DS2VASc  score of 5 Continue eliquis 2.5 mg bid     3. CHF Normovolemic   4. PPM Per Dr. Norma Fredrickson C. Chelsi Warr, [facility] [address] [place], [place] [address] [phone]

## 2022-03-20 ENCOUNTER — Ambulatory Visit (INDEPENDENT_AMBULATORY_CARE_PROVIDER_SITE_OTHER): Payer: Medicare Other | Admitting: Podiatry

## 2022-03-20 ENCOUNTER — Encounter: Payer: Self-pay | Admitting: Podiatry

## 2022-03-20 DIAGNOSIS — M79674 Pain in right toe(s): Secondary | ICD-10-CM | POA: Diagnosis not present

## 2022-03-20 DIAGNOSIS — M79675 Pain in left toe(s): Secondary | ICD-10-CM

## 2022-03-20 DIAGNOSIS — G629 Polyneuropathy, unspecified: Secondary | ICD-10-CM | POA: Diagnosis not present

## 2022-03-20 DIAGNOSIS — B351 Tinea unguium: Secondary | ICD-10-CM | POA: Diagnosis not present

## 2022-03-24 NOTE — Progress Notes (Signed)
  Subjective:  Patient ID: [name], male    DOB: 10-05-1932,  MRN: [id number]  [name] presents to clinic today for at risk foot care with history of peripheral neuropathy and callus(es) b/l lower extremities and painful thick toenails that are difficult to trim. Painful toenails interfere with ambulation. Aggravating factors include wearing enclosed shoe gear. Pain is relieved with periodic professional debridement. Painful calluses are aggravated when weightbearing with and without shoegear. Pain is relieved with periodic professional debridement.  New problem(s): None.   PCP is [name], MD , and last visit was Mar 12, 2022.  Allergies  Allergen Reactions   Amoxicillin Hives and Rash   Gabapentin Itching   Penicillin G Sodium Itching and Rash   Penicillins Itching and Rash    Has patient had a PCN reaction causing immediate rash, facial/tongue/throat swelling, SOB or lightheadedness with hypotension: No Has patient had a PCN reaction causing severe rash involving mucus membranes or skin necrosis: Yes-back and hands. Has patient had a PCN reaction that required hospitalization: No Has patient had a PCN reaction occurring within the last 10 years: Yes If all of the above answers are "NO", then may proceed with Cephalosporin use.   Ramipril Cough    Review of Systems: Negative except as noted in the HPI.  Objective:  General: Patient is a pleasant 88 y.o. Caucasian male WD, WN in NAD. AAO x 3.   Neurovascular Examination: CFT <3 seconds b/l LE. Faintly palpable pedal pulses b/l LE. Pedal hair sparse b/l LE. Skin temperature gradient WNL b/l. No pain with calf compression b/l. No edema b/l LE. No cyanosis or clubbing noted b/l LE.  Pt has subjective symptoms of neuropathy. Protective sensation intact 5/5 intact bilaterally with 10g monofilament b/l. Vibratory sensation diminished b/l.  Dermatological:  Pedal skin thin, shiny and atrophic b/l LE.  No open  wounds b/l. No interdigital macerations b/l. Toenails 1-5 b/l elongated, thickened, discolored with subungual debris. +Tenderness with dorsal palpation of nailplates. Incurvated nailplate medial border left hallux and medial border right hallux with tenderness to palpation. No erythema, no edema, no drainage noted. No fluctuance. Resolved hyperkeratotic lesion(s) plantar heel pad of right foot, R hallux, and submet head 1 b/l.  Musculoskeletal:  Normal muscle strength 5/5 to all lower extremity muscle groups bilaterally. HAV with bunion deformity noted b/l LE.Marland Kitchen No pain, crepitus or joint limitation noted with ROM b/l LE.  Patient ambulates with cane assistance on today's visit.  Assessment/Plan: 1. Pain due to onychomycosis of toenails of both feet   2. Peripheral polyneuropathy     -Examined patient. -Patient to continue soft, supportive shoe gear daily. -Toenails 1-5 b/l were debrided in length and girth with sterile nail nippers and dremel without iatrogenic bleeding.  -Patient/POA to call should there be question/concern in the interim.   Return in about 9 weeks (around 05/22/2022).  Marzetta Board, DPM

## 2022-04-01 ENCOUNTER — Ambulatory Visit (INDEPENDENT_AMBULATORY_CARE_PROVIDER_SITE_OTHER): Payer: Medicare Other

## 2022-04-01 DIAGNOSIS — I255 Ischemic cardiomyopathy: Secondary | ICD-10-CM | POA: Diagnosis not present

## 2022-04-02 LAB — CUP PACEART REMOTE DEVICE CHECK
Battery Remaining Longevity: 44 mo
Battery Remaining Percentage: 71 %
Battery Voltage: 2.96 V
Brady Statistic AP VP Percent: 80 %
Brady Statistic AP VS Percent: 2 %
Brady Statistic AS VP Percent: 16 %
Brady Statistic AS VS Percent: 1.7 %
Brady Statistic RA Percent Paced: 16 %
Date Time Interrogation Session: 20230619020021
HighPow Impedance: 44 Ohm
HighPow Impedance: 44 Ohm
Implantable Lead Implant Date: 20060509
Implantable Lead Implant Date: 20060509
Implantable Lead Implant Date: 20090417
Implantable Lead Location: 753858
Implantable Lead Location: 753859
Implantable Lead Location: 753860
Implantable Lead Model: 5076
Implantable Lead Model: 7001
Implantable Pulse Generator Implant Date: 20211220
Lead Channel Impedance Value: 360 Ohm
Lead Channel Impedance Value: 390 Ohm
Lead Channel Impedance Value: 440 Ohm
Lead Channel Pacing Threshold Amplitude: 0.75 V
Lead Channel Pacing Threshold Amplitude: 1 V
Lead Channel Pacing Threshold Amplitude: 1.625 V
Lead Channel Pacing Threshold Pulse Width: 0.5 ms
Lead Channel Pacing Threshold Pulse Width: 0.8 ms
Lead Channel Pacing Threshold Pulse Width: 0.8 ms
Lead Channel Sensing Intrinsic Amplitude: 11.7 mV
Lead Channel Sensing Intrinsic Amplitude: 2.8 mV
Lead Channel Setting Pacing Amplitude: 2 V
Lead Channel Setting Pacing Amplitude: 2 V
Lead Channel Setting Pacing Amplitude: 2.5 V
Lead Channel Setting Pacing Pulse Width: 0.8 ms
Lead Channel Setting Pacing Pulse Width: 1 ms
Lead Channel Setting Sensing Sensitivity: 0.5 mV
Pulse Gen Serial Number: 9905796

## 2022-04-04 ENCOUNTER — Ambulatory Visit (HOSPITAL_COMMUNITY)
Admission: RE | Admit: 2022-04-04 | Discharge: 2022-04-04 | Disposition: A | Discharge status: Home / Self Care | Payer: Medicare Other | Source: Ambulatory Visit | Attending: Internal Medicine | Admitting: Internal Medicine

## 2022-04-04 ENCOUNTER — Encounter (HOSPITAL_COMMUNITY): Payer: Self-pay | Admitting: Internal Medicine

## 2022-04-04 VITALS — BP 98/50 | HR 88 | Wt 163.2 lb

## 2022-04-04 DIAGNOSIS — Z8616 Personal history of COVID-19: Secondary | ICD-10-CM | POA: Diagnosis not present

## 2022-04-04 DIAGNOSIS — I130 Hypertensive heart and chronic kidney disease with heart failure and stage 1 through stage 4 chronic kidney disease, or unspecified chronic kidney disease: Secondary | ICD-10-CM | POA: Insufficient documentation

## 2022-04-04 DIAGNOSIS — I4891 Unspecified atrial fibrillation: Secondary | ICD-10-CM

## 2022-04-04 DIAGNOSIS — I482 Chronic atrial fibrillation, unspecified: Secondary | ICD-10-CM | POA: Insufficient documentation

## 2022-04-04 DIAGNOSIS — Z7901 Long term (current) use of anticoagulants: Secondary | ICD-10-CM | POA: Insufficient documentation

## 2022-04-04 DIAGNOSIS — I5022 Chronic systolic (congestive) heart failure: Secondary | ICD-10-CM | POA: Diagnosis not present

## 2022-04-04 DIAGNOSIS — I480 Paroxysmal atrial fibrillation: Secondary | ICD-10-CM | POA: Diagnosis not present

## 2022-04-04 DIAGNOSIS — I2510 Atherosclerotic heart disease of native coronary artery without angina pectoris: Secondary | ICD-10-CM | POA: Diagnosis not present

## 2022-04-04 DIAGNOSIS — R251 Tremor, unspecified: Secondary | ICD-10-CM | POA: Insufficient documentation

## 2022-04-04 DIAGNOSIS — I252 Old myocardial infarction: Secondary | ICD-10-CM | POA: Diagnosis not present

## 2022-04-04 DIAGNOSIS — Z79899 Other long term (current) drug therapy: Secondary | ICD-10-CM | POA: Insufficient documentation

## 2022-04-04 DIAGNOSIS — N184 Chronic kidney disease, stage 4 (severe): Secondary | ICD-10-CM

## 2022-04-04 DIAGNOSIS — E785 Hyperlipidemia, unspecified: Secondary | ICD-10-CM | POA: Insufficient documentation

## 2022-04-04 DIAGNOSIS — Z7984 Long term (current) use of oral hypoglycemic drugs: Secondary | ICD-10-CM | POA: Insufficient documentation

## 2022-04-04 LAB — CBC
HCT: 41.7 % (ref 39.0–52.0)
Hemoglobin: 13 g/dL (ref 13.0–17.0)
MCH: 28.8 pg (ref 26.0–34.0)
MCHC: 31.2 g/dL (ref 30.0–36.0)
MCV: 92.5 fL (ref 80.0–100.0)
Platelets: 190 10*3/uL (ref 150–400)
RBC: 4.51 MIL/uL (ref 4.22–5.81)
RDW: 14.3 % (ref 11.5–15.5)
WBC: 5.5 10*3/uL (ref 4.0–10.5)
nRBC: 0 % (ref 0.0–0.2)

## 2022-04-04 LAB — BASIC METABOLIC PANEL
Anion gap: 8 (ref 5–15)
BUN: 28 mg/dL — ABNORMAL HIGH (ref 8–23)
CO2: 25 mmol/L (ref 22–32)
Calcium: 8.8 mg/dL — ABNORMAL LOW (ref 8.9–10.3)
Chloride: 105 mmol/L (ref 98–111)
Creatinine, Ser: 2.13 mg/dL — ABNORMAL HIGH (ref 0.61–1.24)
GFR, Estimated: 29 mL/min — ABNORMAL LOW (ref >60)
Glucose, Bld: 111 mg/dL — ABNORMAL HIGH (ref 70–99)
Potassium: 3.7 mmol/L (ref 3.5–5.1)
Sodium: 138 mmol/L (ref 135–145)

## 2022-04-04 LAB — BRAIN NATRIURETIC PEPTIDE: B Natriuretic Peptide: 525.9 pg/mL — ABNORMAL HIGH (ref 0.0–100.0)

## 2022-04-04 NOTE — Patient Instructions (Signed)
There has been no changes to your medications.  Labs done today, your results will be available in MyChart, we will contact you for abnormal readings.  Your physician has requested that you have an echocardiogram. Echocardiography is a painless test that uses sound waves to create images of your heart. It provides your doctor with information about the size and shape of your heart and how well your heart's chambers and valves are working. This procedure takes approximately one hour. There are no restrictions for this procedure.  Your physician recommends that you schedule a follow-up appointment in: 6 months ( December 2023) ** please cal the office in October to arrange your follow up appointment **  If you have any questions or concerns before your next appointment please send [place] a message through [place] or call our office at [phone].    TO LEAVE A MESSAGE FOR THE NURSE SELECT OPTION 2, PLEASE LEAVE A MESSAGE INCLUDING: YOUR NAME DATE OF BIRTH CALL BACK NUMBER REASON FOR CALL**this is important as we prioritize the call backs  YOU WILL RECEIVE A CALL BACK THE SAME DAY AS LONG AS YOU CALL BEFORE 4:00 PM  At the [facility], you and your health needs are our priority. As part of our continuing mission to provide you with exceptional heart care, we have created designated Provider Care Teams. These Care Teams include your primary Cardiologist (physician) and Advanced Practice Providers (APPs- Physician Assistants and Nurse Practitioners) who all work together to provide you with the care you need, when you need it.   You may see any of the following providers on your designated Care Team at your next follow up: Dr Glori Bickers Dr Haynes Kerns, NP Lyda Jester, [place] [facility] [place], [place] Audry Riles, PharmD   Please be sure to bring in all your medications bottles to every appointment.

## 2022-04-04 NOTE — Progress Notes (Signed)
Patient ID: [name], male   DOB: Aug 05, 1932, 88 y.o.   MRN: [id number]    Advanced Heart Failure Clinic Note   PCP: Dr. Timothy Lasso  HPI: [name] is a 88 y.o. male with a history of coronary artery disease, status post previous large anterior wall myocardial infarction s/p multivessel stenting, systolic CHF with EF 25% s/p St. Jude BiV ICD.  Remainder of his medical history is notable for atrial fibrillation,maintaining sinus rhythm on amiodarone.  He is not on Coumadin secondary to GI bleed, chronic renal insufficiency with baseline creatinine about 2.2-2.5, hypertension, hyperlipidemia, and a systemic tremor. Bladder cancer 05/2012. Completed BCG treatment.   2015 found that his left ventricular lead has not been capturing suggestive of either fracture of the proximal electrode or an issue at the header. They reprogrammed the device to use to the RV coil which he tolerated well and had a good threshold.   He was admitted in 11/17 for respiratory virus.  Admitted 12/30 - 10/15/16 with A/C CHF in setting of URI. Thought to have mild CHF, so given IV diuresis in house with rapid improvement.   Admitted 2/19 with enterococcus bacteremia thought to be UTI. Treated with daptomycin. TEE EF 20-25% no vegetation mod MR  Has had a rough 6 months. Was in/out of hospital 3x. Weak, unable to walk. Had severe back spasms. Flu A and COVID. Imaging ok. Carvedilol cut back.   Has been back in AF since 4/23. Saw Dr. Graciela Husbands and Rudi Coco in AF Clinic and decided on rate control strategy.   He presents today for regular follow up with his wife. Says he feels better. Able to walk some now. Denies SOB or CP. Uses cane. Has completed 3 months HHPT. Remains on lasix 20 bid. No edema.   ICD:     Echo 12/14 LVEF 20-25% Carotid u/s 3/14: 40-59% R 0-39%%L Echo 11/2017: EF 25%   Review of systems complete and found to be negative unless listed in HPI.    Past Medical History:  Diagnosis Date    Acute bronchitis with COPD ([facility]) 09/10/2016   Anemia    Atrial fibrillation or flutter    maintaining sinus on amiodarone     Bladder cancer ([facility]) dx'd 05/2012   BPH (benign prostatic hyperplasia)    CAD (coronary artery disease)     a. s/p anterior MI 12/05 c/b shock -> stent LAD   b. s/p stenting OM-1, 2/06   CHF (congestive heart failure) ([facility])    due to ischemic CM  a. EF 20-30%. (Nov 2008)   b. s/p St. Jude BiV-ICD    c. 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/16/2013   Major depression, single episode, in complete remission ([facility]) 08/16/2016   Neuromuscular disorder ([facility])    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"   no neurologist   Osteoarthritis 03/08/2011   Skin cancer    basal cells   facial x  4, 1 right forearm   Sleep apnea    STOP BANG SCORE 4    Current Outpatient Medications  Medication Sig Dispense Refill   acetaminophen (TYLENOL) 500 MG tablet Take 500-1,000 mg by mouth every 6 (six) hours as needed (for pain).     apixaban (ELIQUIS) 2.5 MG TABS tablet Take 2.5 mg by mouth 2 (two) times daily.     carboxymethylcellulose (REFRESH PLUS) 0.5 % SOLN Place 1 drop into both eyes 3 (three) times daily as needed (dry/irritated eyes.).     carvedilol (COREG) 6.25 MG tablet Take 0.5 tablets (3.125 mg total) by mouth 2 (two) times daily with a meal. 30 tablet 1   cephALEXin (KEFLEX) 500 MG capsule Take 2,000 mg by mouth See admin instructions. Take 2,000 mg by mouth 45 minutes before dental procedures      ezetimibe (ZETIA) 10 MG tablet Take 10 mg by mouth daily with supper.     FARXIGA 10 MG TABS tablet TAKE 1 TABLET BY MOUTH DAILY BEFORE BREAKFAST. 90 tablet 3   ferrous sulfate 325 (65 FE) MG tablet Take 325 mg by mouth daily with supper.     furosemide (LASIX) 20 MG tablet Take 20 mg by mouth 2 (two) times daily.     guaiFENesin (MUCINEX) 600 MG 12 hr tablet Take by mouth as needed.     KLOR-CON M10 10 MEQ tablet Take 10 mEq by mouth in the morning.     Multiple Vitamin (MULTIVITAMIN WITH MINERALS) TABS tablet Take 1 tablet by mouth daily with supper. Centrum Silver     nitroGLYCERIN (NITROSTAT) 0.4 MG SL tablet DISSOLVE ONE TABLET UNDER THE TONGUE EVERY 5 MINUTES AS NEEDED FOR CHEST PAIN.  DO NOT EXCEED A TOTAL OF 3 DOSES IN 15 MINUTES 25 tablet 0   ondansetron (ZOFRAN) 4 MG tablet Take 4 mg by mouth 3 (three) times daily as needed for nausea or vomiting.     pantoprazole (PROTONIX) 40 MG tablet Take 40 mg by mouth every evening.     polyethylene glycol powder (GLYCOLAX/MIRALAX) 17 GM/SCOOP powder as needed.     rosuvastatin (CRESTOR) 20 MG tablet Take 20 mg by mouth every evening.     sodium chloride (OCEAN) 0.65 % SOLN nasal spray Place 1 spray into both nostrils as needed for congestion.     SYNTHROID 75 MCG tablet Take 75 mcg by mouth daily before breakfast.     tamsulosin (FLOMAX) 0.4 MG CAPS capsule Take 0.4 mg by mouth at bedtime.      traMADol (ULTRAM) 50 MG tablet Take 50 mg by mouth as needed.     triamcinolone (NASACORT) 55 MCG/ACT AERO nasal inhaler Place 2 sprays into the nose daily as needed.     No current facility-administered medications for this encounter.   PHYSICAL EXAM: Vitals:   04/04/22 1352  BP: (!) 98/50  Pulse: 88  SpO2: 96%  Weight: 74 kg (163 lb 3.2 oz)     Wt Readings from Last 3 Encounters:  04/04/22 74 kg (163 lb 3.2 oz)  03/05/22 74.9 kg (165 lb 3.2 oz)  01/17/22 74.8 kg (165 lb)    General:  Elderly Well appearing. No resp difficulty HEENT:  normal Neck: supple. no JVD. Carotids 2+ bilat; no bruits. No lymphadenopathy or thryomegaly appreciated. Cor: PMI nondisplaced. Irregular rate & rhythm. No rubs, gallops or murmurs. Lungs: clear Abdomen: soft, nontender, nondistended. No hepatosplenomegaly. No bruits or masses. Good bowel sounds. Extremities: no cyanosis, clubbing, rash, edema Neuro: alert &  orientedx3, cranial nerves grossly intact. moves all 4 extremities w/o difficulty. Affect pleasant   ECG: AF 83 with v-pacing Personally reviewed    ASSESSMENT & PLAN:  1. Chronic systolic CHF. Echo 10/13/2017 LVEF 20-25% (Stable from last year). Echo 2/19 EF 20-25% - Stable chronic NYHA III - Volume status ok - Continue lasix 20 mg bid - BP too low to add valsartan or MRA back especially with CKD IV. - Continue coreg 3.125 mg BID. (Recently cut back due to low BP) - Continue Farxiga 10 - Check labs today - Repeat echo   2. CAD - No s/s of ischemia  - Off ASA due to Eliquis.  - Continue statin. Lipids followed by Dr. Timothy Lasso. Goal LDL < 70  3. PAF -> now chronic AF - Remains in rate controlled AF on ECG today  - Saw Dr. Graciela Husbands and Rudi Coco in AF Clinic and decided on rate control strategy.  - Denies bleeding. Continue Eliquis 2.5 bid   4. Carotid ultrasound  - 1-39% bilaterally on last scan 3/15. No repeat warranted.   no change  5. CKD, Stage IV  - Creatinine baseline 2.2-2.3  - Follows with Dr. Marisue Humble - Continue [place] - Labs today   Arvilla Meres, MD  2:24 PM

## 2022-04-22 NOTE — Progress Notes (Signed)
Remote ICD transmission.   

## 2022-04-23 ENCOUNTER — Ambulatory Visit (HOSPITAL_COMMUNITY)
Admission: RE | Admit: 2022-04-23 | Discharge: 2022-04-23 | Disposition: A | Discharge status: Home / Self Care | Payer: Medicare Other | Source: Ambulatory Visit | Attending: Internal Medicine | Admitting: Internal Medicine

## 2022-04-23 DIAGNOSIS — I110 Hypertensive heart disease with heart failure: Secondary | ICD-10-CM | POA: Diagnosis not present

## 2022-04-23 DIAGNOSIS — I083 Combined rheumatic disorders of mitral, aortic and tricuspid valves: Secondary | ICD-10-CM | POA: Insufficient documentation

## 2022-04-23 DIAGNOSIS — E785 Hyperlipidemia, unspecified: Secondary | ICD-10-CM | POA: Insufficient documentation

## 2022-04-23 DIAGNOSIS — I5022 Chronic systolic (congestive) heart failure: Secondary | ICD-10-CM | POA: Insufficient documentation

## 2022-04-23 LAB — ECHOCARDIOGRAM COMPLETE
Calc EF: 30.6 %
MV M vel: 4.07 m/s
MV Peak grad: 66.3 mmHg
P 1/2 time: 391 msec
Radius: 0.7 cm
S' Lateral: 5.1 cm
Single Plane A2C EF: 33.1 %
Single Plane A4C EF: 26.7 %

## 2022-05-14 DIAGNOSIS — I509 Heart failure, unspecified: Secondary | ICD-10-CM | POA: Diagnosis not present

## 2022-05-14 DIAGNOSIS — I129 Hypertensive chronic kidney disease with stage 1 through stage 4 chronic kidney disease, or unspecified chronic kidney disease: Secondary | ICD-10-CM | POA: Diagnosis not present

## 2022-05-14 DIAGNOSIS — N184 Chronic kidney disease, stage 4 (severe): Secondary | ICD-10-CM | POA: Diagnosis not present

## 2022-05-14 DIAGNOSIS — D472 Monoclonal gammopathy: Secondary | ICD-10-CM | POA: Diagnosis not present

## 2022-05-22 ENCOUNTER — Encounter: Payer: Self-pay | Admitting: Podiatry

## 2022-05-22 ENCOUNTER — Ambulatory Visit (INDEPENDENT_AMBULATORY_CARE_PROVIDER_SITE_OTHER): Payer: Medicare Other | Admitting: Podiatry

## 2022-05-22 DIAGNOSIS — G629 Polyneuropathy, unspecified: Secondary | ICD-10-CM

## 2022-05-22 DIAGNOSIS — M79674 Pain in right toe(s): Secondary | ICD-10-CM

## 2022-05-22 DIAGNOSIS — B351 Tinea unguium: Secondary | ICD-10-CM

## 2022-05-22 DIAGNOSIS — M79675 Pain in left toe(s): Secondary | ICD-10-CM | POA: Diagnosis not present

## 2022-05-28 NOTE — Progress Notes (Signed)
  Subjective:  Patient ID: [name], male    DOB: 86/02/1932,  MRN: [id number]  [name] presents to clinic today for at risk foot care with history of peripheral neuropathy and painful thick toenails that are difficult to trim. Pain interferes with ambulation. Aggravating factors include wearing enclosed shoe gear. Pain is relieved with periodic professional debridement.  His wife if present during today's visit.  New problem(s): None.   PCP is [name], MD , and last visit was Mar 12, 2022.  Allergies  Allergen Reactions   Amoxicillin Hives and Rash   Gabapentin Itching   Penicillin G Sodium Itching and Rash   Penicillins Itching and Rash    Has patient had a PCN reaction causing immediate rash, facial/tongue/throat swelling, SOB or lightheadedness with hypotension: No Has patient had a PCN reaction causing severe rash involving mucus membranes or skin necrosis: Yes-back and hands. Has patient had a PCN reaction that required hospitalization: No Has patient had a PCN reaction occurring within the last 10 years: Yes If all of the above answers are "NO", then may proceed with Cephalosporin use.   Ramipril Cough    Review of Systems: Negative except as noted in the HPI.  Objective:  General: Patient is a pleasant 88 y.o. Caucasian male WD, WN in NAD. AAO x 3.   Neurovascular Examination: CFT <3 seconds b/l LE. Faintly palpable pedal pulses b/l LE. Pedal hair sparse b/l LE. Skin temperature gradient WNL b/l. No pain with calf compression b/l. No edema b/l LE. No cyanosis or clubbing noted b/l LE.  Pt has subjective symptoms of neuropathy. Protective sensation intact 5/5 intact bilaterally with 10g monofilament b/l. Vibratory sensation diminished b/l.  Dermatological:  Pedal skin thin, shiny and atrophic b/l LE.  No open wounds b/l. No interdigital macerations b/l. Toenails 1-5 b/l elongated, thickened, discolored with subungual debris. +Tenderness with dorsal  palpation of nailplates. Incurvated nailplate medial border left hallux and medial border right hallux with tenderness to palpation. No erythema, no edema, no drainage noted. No fluctuance.   Musculoskeletal:  Normal muscle strength 5/5 to all lower extremity muscle groups bilaterally. HAV with bunion deformity noted b/l LE.Marland Kitchen No pain, crepitus or joint limitation noted with ROM b/l LE.  Patient ambulates with cane assistance on today's visit.  Assessment/Plan: 1. Pain due to onychomycosis of toenails of both feet   2. Peripheral polyneuropathy    -Patient was evaluated and treated. All patient's and/or POA's questions/concerns answered on today's visit. -Patient's family member present. All questions/concerns addressed on today's visit. -Mycotic toenails 1-5 bilaterally were debrided in length and girth with sterile nail nippers and dremel without incident. -Patient/POA to call should there be question/concern in the interim.   Return in about 3 months (around 08/22/2022).  Marzetta Board, DPM

## 2022-06-23 ENCOUNTER — Other Ambulatory Visit: Payer: Self-pay | Admitting: Internal Medicine

## 2022-06-27 ENCOUNTER — Other Ambulatory Visit (HOSPITAL_COMMUNITY): Payer: Self-pay | Admitting: *Deleted

## 2022-06-27 DIAGNOSIS — H61002 Unspecified perichondritis of left external ear: Secondary | ICD-10-CM | POA: Diagnosis not present

## 2022-06-27 DIAGNOSIS — D485 Neoplasm of uncertain behavior of skin: Secondary | ICD-10-CM | POA: Diagnosis not present

## 2022-06-27 DIAGNOSIS — C44319 Basal cell carcinoma of skin of other parts of face: Secondary | ICD-10-CM | POA: Diagnosis not present

## 2022-06-27 DIAGNOSIS — Z85828 Personal history of other malignant neoplasm of skin: Secondary | ICD-10-CM | POA: Diagnosis not present

## 2022-06-27 DIAGNOSIS — C44329 Squamous cell carcinoma of skin of other parts of face: Secondary | ICD-10-CM | POA: Diagnosis not present

## 2022-06-27 DIAGNOSIS — L820 Inflamed seborrheic keratosis: Secondary | ICD-10-CM | POA: Diagnosis not present

## 2022-06-27 MED ORDER — APIXABAN 2.5 MG PO TABS: 2.5000 mg | ORAL_TABLET | Freq: Two times a day (BID) | ORAL | 11 refills | Status: DC

## 2022-07-01 ENCOUNTER — Ambulatory Visit (INDEPENDENT_AMBULATORY_CARE_PROVIDER_SITE_OTHER): Payer: Medicare Other

## 2022-07-01 DIAGNOSIS — I5022 Chronic systolic (congestive) heart failure: Secondary | ICD-10-CM

## 2022-07-02 LAB — CUP PACEART REMOTE DEVICE CHECK
Battery Remaining Longevity: 50 mo
Battery Remaining Percentage: 68 %
Battery Voltage: 2.96 V
Brady Statistic AP VP Percent: 80 %
Brady Statistic AP VS Percent: 2.1 %
Brady Statistic AS VP Percent: 16 %
Brady Statistic AS VS Percent: 1.7 %
Brady Statistic RA Percent Paced: 10 %
Date Time Interrogation Session: 20230918020017
HighPow Impedance: 44 Ohm
HighPow Impedance: 44 Ohm
Implantable Lead Implant Date: 20060509
Implantable Lead Implant Date: 20060509
Implantable Lead Implant Date: 20090417
Implantable Lead Location: 753858
Implantable Lead Location: 753859
Implantable Lead Location: 753860
Implantable Lead Model: 5076
Implantable Lead Model: 7001
Implantable Pulse Generator Implant Date: 20211220
Lead Channel Impedance Value: 350 Ohm
Lead Channel Impedance Value: 350 Ohm
Lead Channel Impedance Value: 530 Ohm
Lead Channel Pacing Threshold Amplitude: 0.75 V
Lead Channel Pacing Threshold Amplitude: 1.25 V
Lead Channel Pacing Threshold Amplitude: 1.625 V
Lead Channel Pacing Threshold Pulse Width: 0.5 ms
Lead Channel Pacing Threshold Pulse Width: 0.8 ms
Lead Channel Pacing Threshold Pulse Width: 0.8 ms
Lead Channel Sensing Intrinsic Amplitude: 0.8 mV
Lead Channel Sensing Intrinsic Amplitude: 11.7 mV
Lead Channel Setting Pacing Amplitude: 2 V
Lead Channel Setting Pacing Amplitude: 2.25 V
Lead Channel Setting Pacing Amplitude: 2.5 V
Lead Channel Setting Pacing Pulse Width: 0.8 ms
Lead Channel Setting Pacing Pulse Width: 1 ms
Lead Channel Setting Sensing Sensitivity: 0.5 mV
Pulse Gen Serial Number: 9905796

## 2022-07-13 NOTE — Progress Notes (Signed)
Remote ICD transmission.   

## 2022-07-17 ENCOUNTER — Telehealth: Payer: Self-pay | Admitting: Internal Medicine

## 2022-07-17 NOTE — Telephone Encounter (Signed)
Pt c/o medication issue:  1. Name of Medication:   SYNTHROID 75 MCG tablet    2. How are you currently taking this medication (dosage and times per day)? Take 75 mcg by mouth daily before breakfast  3. Are you having a reaction (difficulty breathing--STAT)? No  4. What is your medication issue? Spouse states that she received a call from pharmacy stating that pt should change the time of day medication is to be taken. Wife would like a callback regarding this matter. Please advise

## 2022-07-18 ENCOUNTER — Telehealth: Payer: Self-pay | Admitting: Internal Medicine

## 2022-07-18 NOTE — Telephone Encounter (Signed)
This has been addressed in another encounter dated 07/17/2022.  Please see encounter for complete details.

## 2022-07-18 NOTE — Telephone Encounter (Signed)
Pt c/o medication issue:  1. Name of Medication: SYNTHROID 75 MCG tablet  2. How are you currently taking this medication (dosage and times per day)?   3. Are you having a reaction (difficulty breathing--STAT)?   4. What is your medication issue? Pt's wife is requesting call back for medication clarification. She states they received different instructions from the pharmacy and would like to discuss this. Please advise.

## 2022-07-18 NOTE — Telephone Encounter (Signed)
Spoke with pt's wife, Romie Minus, [place] who states pt continues to take Pantoprazole '40mg'$  in the evening and Synthroid '75mg'$  before breakfast.  Pt's wife states when she picked up the medication yesterday from [facility] there was a note that advised the pt to take Synthroid at Lunch time due to pt taking Pantoprazole.  Pt states she explained to pharmacy that pt takes Pantoprazole at night but they advised her to contact the prescribing provider. Pt's wife would like to know if pt should continue to take Synthroid prior to breakfast as instructed in the past.  Dr Caryl Comes has been the prescribing provider for Synthroid due to managing Amiodarone in the past.

## 2022-07-18 NOTE — Telephone Encounter (Signed)
Spoke with pt's wife, Romie Minus, [place] and advised pt should continue taking medications as he has been.  Pt's wife verbalizes understanding and thanked Therapist, sports for the call.

## 2022-07-18 NOTE — Telephone Encounter (Signed)
Patient should continue to take medications as he has been. His synthroid dose has been adjusted/managed based on how he has been taking.

## 2022-07-22 ENCOUNTER — Other Ambulatory Visit: Payer: Self-pay | Admitting: Internal Medicine

## 2022-07-22 ENCOUNTER — Telehealth: Payer: Self-pay

## 2022-07-22 DIAGNOSIS — Z79899 Other long term (current) drug therapy: Secondary | ICD-10-CM

## 2022-07-22 DIAGNOSIS — E039 Hypothyroidism, unspecified: Secondary | ICD-10-CM

## 2022-07-22 NOTE — Telephone Encounter (Signed)
Spoke with pt's wife,DPR and advised the office received a refill request today for Synthroid.  Per Dr Caryl Comes pt will need to have a TSH before authorizing a refill.  Pt's wife verbalizes understanding and agrees with current plan.  Order placed and appointment scheduled.

## 2022-07-24 ENCOUNTER — Ambulatory Visit: Payer: Medicare Other | Attending: Cardiology

## 2022-07-24 DIAGNOSIS — Z79899 Other long term (current) drug therapy: Secondary | ICD-10-CM

## 2022-07-24 DIAGNOSIS — E039 Hypothyroidism, unspecified: Secondary | ICD-10-CM

## 2022-07-24 LAB — TSH: TSH: 1.45 u[IU]/mL (ref 0.450–4.500)

## 2022-08-07 ENCOUNTER — Ambulatory Visit: Payer: Medicare Other | Admitting: Podiatry

## 2022-08-08 ENCOUNTER — Telehealth: Payer: Self-pay | Admitting: Internal Medicine

## 2022-08-08 NOTE — Telephone Encounter (Signed)
Follow Up:     Wife is calling for patient's lab results from 07-24-22 please.

## 2022-08-09 ENCOUNTER — Encounter: Payer: Self-pay | Admitting: Podiatry

## 2022-08-09 ENCOUNTER — Ambulatory Visit (INDEPENDENT_AMBULATORY_CARE_PROVIDER_SITE_OTHER): Payer: Medicare Other | Admitting: Podiatry

## 2022-08-09 DIAGNOSIS — M79674 Pain in right toe(s): Secondary | ICD-10-CM

## 2022-08-09 DIAGNOSIS — G629 Polyneuropathy, unspecified: Secondary | ICD-10-CM | POA: Diagnosis not present

## 2022-08-09 DIAGNOSIS — B351 Tinea unguium: Secondary | ICD-10-CM

## 2022-08-09 DIAGNOSIS — M79675 Pain in left toe(s): Secondary | ICD-10-CM

## 2022-08-11 ENCOUNTER — Inpatient Hospital Stay (HOSPITAL_COMMUNITY)
Admission: EM | Admit: 2022-08-11 | Discharge: 2022-08-13 | DRG: 291 | Disposition: A | Discharge status: Home Health | Payer: Medicare Other | Attending: Internal Medicine | Admitting: Internal Medicine

## 2022-08-11 ENCOUNTER — Other Ambulatory Visit: Payer: Self-pay

## 2022-08-11 ENCOUNTER — Inpatient Hospital Stay (HOSPITAL_COMMUNITY): Payer: Medicare Other

## 2022-08-11 ENCOUNTER — Emergency Department (HOSPITAL_COMMUNITY): Payer: Medicare Other

## 2022-08-11 ENCOUNTER — Encounter (HOSPITAL_COMMUNITY): Payer: Self-pay

## 2022-08-11 DIAGNOSIS — Z95810 Presence of automatic (implantable) cardiac defibrillator: Secondary | ICD-10-CM

## 2022-08-11 DIAGNOSIS — Z79899 Other long term (current) drug therapy: Secondary | ICD-10-CM

## 2022-08-11 DIAGNOSIS — Z880 Allergy status to penicillin: Secondary | ICD-10-CM

## 2022-08-11 DIAGNOSIS — Z85828 Personal history of other malignant neoplasm of skin: Secondary | ICD-10-CM

## 2022-08-11 DIAGNOSIS — Z888 Allergy status to other drugs, medicaments and biological substances status: Secondary | ICD-10-CM

## 2022-08-11 DIAGNOSIS — I5023 Acute on chronic systolic (congestive) heart failure: Secondary | ICD-10-CM | POA: Diagnosis present

## 2022-08-11 DIAGNOSIS — Z9861 Coronary angioplasty status: Secondary | ICD-10-CM

## 2022-08-11 DIAGNOSIS — E782 Mixed hyperlipidemia: Secondary | ICD-10-CM | POA: Diagnosis not present

## 2022-08-11 DIAGNOSIS — I4819 Other persistent atrial fibrillation: Secondary | ICD-10-CM | POA: Diagnosis present

## 2022-08-11 DIAGNOSIS — I110 Hypertensive heart disease with heart failure: Secondary | ICD-10-CM | POA: Diagnosis not present

## 2022-08-11 DIAGNOSIS — Z1152 Encounter for screening for COVID-19: Secondary | ICD-10-CM

## 2022-08-11 DIAGNOSIS — I2510 Atherosclerotic heart disease of native coronary artery without angina pectoris: Secondary | ICD-10-CM | POA: Diagnosis present

## 2022-08-11 DIAGNOSIS — G4730 Sleep apnea, unspecified: Secondary | ICD-10-CM | POA: Diagnosis not present

## 2022-08-11 DIAGNOSIS — R079 Chest pain, unspecified: Secondary | ICD-10-CM | POA: Diagnosis not present

## 2022-08-11 DIAGNOSIS — I5022 Chronic systolic (congestive) heart failure: Secondary | ICD-10-CM | POA: Diagnosis present

## 2022-08-11 DIAGNOSIS — I10 Essential (primary) hypertension: Secondary | ICD-10-CM | POA: Diagnosis present

## 2022-08-11 DIAGNOSIS — J440 Chronic obstructive pulmonary disease with acute lower respiratory infection: Secondary | ICD-10-CM | POA: Diagnosis present

## 2022-08-11 DIAGNOSIS — I130 Hypertensive heart and chronic kidney disease with heart failure and stage 1 through stage 4 chronic kidney disease, or unspecified chronic kidney disease: Secondary | ICD-10-CM | POA: Diagnosis not present

## 2022-08-11 DIAGNOSIS — Z79890 Hormone replacement therapy (postmenopausal): Secondary | ICD-10-CM

## 2022-08-11 DIAGNOSIS — K219 Gastro-esophageal reflux disease without esophagitis: Secondary | ICD-10-CM | POA: Diagnosis present

## 2022-08-11 DIAGNOSIS — Z8551 Personal history of malignant neoplasm of bladder: Secondary | ICD-10-CM

## 2022-08-11 DIAGNOSIS — Z823 Family history of stroke: Secondary | ICD-10-CM

## 2022-08-11 DIAGNOSIS — Z87891 Personal history of nicotine dependence: Secondary | ICD-10-CM | POA: Diagnosis not present

## 2022-08-11 DIAGNOSIS — Z7901 Long term (current) use of anticoagulants: Secondary | ICD-10-CM

## 2022-08-11 DIAGNOSIS — Z8249 Family history of ischemic heart disease and other diseases of the circulatory system: Secondary | ICD-10-CM | POA: Diagnosis not present

## 2022-08-11 DIAGNOSIS — Z66 Do not resuscitate: Secondary | ICD-10-CM | POA: Diagnosis present

## 2022-08-11 DIAGNOSIS — R0600 Dyspnea, unspecified: Secondary | ICD-10-CM | POA: Diagnosis not present

## 2022-08-11 DIAGNOSIS — I252 Old myocardial infarction: Secondary | ICD-10-CM

## 2022-08-11 DIAGNOSIS — J90 Pleural effusion, not elsewhere classified: Secondary | ICD-10-CM | POA: Diagnosis not present

## 2022-08-11 DIAGNOSIS — I4891 Unspecified atrial fibrillation: Secondary | ICD-10-CM | POA: Diagnosis not present

## 2022-08-11 DIAGNOSIS — I959 Hypotension, unspecified: Secondary | ICD-10-CM | POA: Diagnosis present

## 2022-08-11 DIAGNOSIS — R7989 Other specified abnormal findings of blood chemistry: Secondary | ICD-10-CM | POA: Diagnosis not present

## 2022-08-11 DIAGNOSIS — E039 Hypothyroidism, unspecified: Secondary | ICD-10-CM | POA: Diagnosis present

## 2022-08-11 DIAGNOSIS — N400 Enlarged prostate without lower urinary tract symptoms: Secondary | ICD-10-CM | POA: Diagnosis present

## 2022-08-11 DIAGNOSIS — J811 Chronic pulmonary edema: Secondary | ICD-10-CM | POA: Diagnosis not present

## 2022-08-11 DIAGNOSIS — R609 Edema, unspecified: Secondary | ICD-10-CM | POA: Diagnosis not present

## 2022-08-11 DIAGNOSIS — I4510 Unspecified right bundle-branch block: Secondary | ICD-10-CM | POA: Diagnosis present

## 2022-08-11 DIAGNOSIS — I509 Heart failure, unspecified: Secondary | ICD-10-CM | POA: Diagnosis not present

## 2022-08-11 DIAGNOSIS — N184 Chronic kidney disease, stage 4 (severe): Secondary | ICD-10-CM | POA: Diagnosis not present

## 2022-08-11 DIAGNOSIS — R0789 Other chest pain: Secondary | ICD-10-CM | POA: Diagnosis not present

## 2022-08-11 LAB — CBC
HCT: 40.9 % (ref 39.0–52.0)
Hemoglobin: 13.2 g/dL (ref 13.0–17.0)
MCH: 29.7 pg (ref 26.0–34.0)
MCHC: 32.3 g/dL (ref 30.0–36.0)
MCV: 91.9 fL (ref 80.0–100.0)
Platelets: 164 10*3/uL (ref 150–400)
RBC: 4.45 MIL/uL (ref 4.22–5.81)
RDW: 15.3 % (ref 11.5–15.5)
WBC: 5.1 10*3/uL (ref 4.0–10.5)
nRBC: 0 % (ref 0.0–0.2)

## 2022-08-11 LAB — TROPONIN I (HIGH SENSITIVITY)
Troponin I (High Sensitivity): 19 ng/L — ABNORMAL HIGH (ref <18)
Troponin I (High Sensitivity): 18 ng/L — ABNORMAL HIGH (ref <18)

## 2022-08-11 LAB — BASIC METABOLIC PANEL
Anion gap: 8 (ref 5–15)
BUN: 22 mg/dL (ref 8–23)
CO2: 25 mmol/L (ref 22–32)
Calcium: 9.1 mg/dL (ref 8.9–10.3)
Chloride: 108 mmol/L (ref 98–111)
Creatinine, Ser: 2.11 mg/dL — ABNORMAL HIGH (ref 0.61–1.24)
GFR, Estimated: 30 mL/min — ABNORMAL LOW (ref >60)
Glucose, Bld: 112 mg/dL — ABNORMAL HIGH (ref 70–99)
Potassium: 3.9 mmol/L (ref 3.5–5.1)
Sodium: 141 mmol/L (ref 135–145)

## 2022-08-11 LAB — PROTIME-INR
INR: 1.4 — ABNORMAL HIGH (ref 0.8–1.2)
Prothrombin Time: 16.8 seconds — ABNORMAL HIGH (ref 11.4–15.2)

## 2022-08-11 LAB — RESP PANEL BY RT-PCR (FLU A&B, COVID) ARPGX2
Influenza A by PCR: NEGATIVE
Influenza B by PCR: NEGATIVE
SARS Coronavirus 2 by RT PCR: NEGATIVE

## 2022-08-11 LAB — BRAIN NATRIURETIC PEPTIDE: B Natriuretic Peptide: 997.1 pg/mL — ABNORMAL HIGH (ref 0.0–100.0)

## 2022-08-11 MED ORDER — ACETAMINOPHEN 650 MG RE SUPP: 650.0000 mg | Freq: Four times a day (QID) | RECTAL | Status: DC | PRN

## 2022-08-11 MED ORDER — ACETAMINOPHEN 325 MG PO TABS
650.0000 mg | ORAL_TABLET | Freq: Four times a day (QID) | ORAL | Status: DC | PRN
  Administered 2022-08-12: 650 mg via ORAL
  Filled 2022-08-11: qty 2

## 2022-08-11 MED ORDER — ONDANSETRON HCL 4 MG PO TABS: 4.0000 mg | ORAL_TABLET | Freq: Four times a day (QID) | ORAL | Status: DC | PRN

## 2022-08-11 MED ORDER — ALBUTEROL SULFATE (2.5 MG/3ML) 0.083% IN NEBU: 2.5000 mg | INHALATION_SOLUTION | Freq: Four times a day (QID) | RESPIRATORY_TRACT | Status: DC | PRN

## 2022-08-11 MED ORDER — FUROSEMIDE 10 MG/ML IJ SOLN
40.0000 mg | Freq: Once | INTRAMUSCULAR | Status: AC
  Administered 2022-08-11: 40 mg via INTRAVENOUS
  Filled 2022-08-11: qty 4

## 2022-08-11 MED ORDER — APIXABAN 2.5 MG PO TABS
2.5000 mg | ORAL_TABLET | Freq: Two times a day (BID) | ORAL | Status: DC
  Administered 2022-08-11 – 2022-08-13 (×4): 2.5 mg via ORAL
  Filled 2022-08-11 (×4): qty 1

## 2022-08-11 MED ORDER — FUROSEMIDE 10 MG/ML IJ SOLN
40.0000 mg | Freq: Two times a day (BID) | INTRAMUSCULAR | Status: DC
  Administered 2022-08-11 – 2022-08-13 (×4): 40 mg via INTRAVENOUS
  Filled 2022-08-11 (×4): qty 4

## 2022-08-11 MED ORDER — METOPROLOL TARTRATE 12.5 MG HALF TABLET
12.5000 mg | ORAL_TABLET | Freq: Two times a day (BID) | ORAL | Status: DC
  Administered 2022-08-12 – 2022-08-13 (×3): 12.5 mg via ORAL
  Filled 2022-08-11 (×3): qty 1

## 2022-08-11 MED ORDER — CARVEDILOL 3.125 MG PO TABS
6.2500 mg | ORAL_TABLET | Freq: Every day | ORAL | Status: DC
  Administered 2022-08-11: 6.25 mg via ORAL
  Filled 2022-08-11: qty 2

## 2022-08-11 MED ORDER — LEVOTHYROXINE SODIUM 75 MCG PO TABS
75.0000 ug | ORAL_TABLET | Freq: Every day | ORAL | Status: DC
  Administered 2022-08-12 – 2022-08-13 (×2): 75 ug via ORAL
  Filled 2022-08-11 (×2): qty 1

## 2022-08-11 MED ORDER — SODIUM CHLORIDE 0.9% FLUSH
3.0000 mL | Freq: Two times a day (BID) | INTRAVENOUS | Status: DC
  Administered 2022-08-11 – 2022-08-12 (×3): 3 mL via INTRAVENOUS

## 2022-08-11 MED ORDER — ROSUVASTATIN CALCIUM 20 MG PO TABS
20.0000 mg | ORAL_TABLET | Freq: Every evening | ORAL | Status: DC
  Administered 2022-08-11 – 2022-08-12 (×2): 20 mg via ORAL
  Filled 2022-08-11 (×2): qty 1

## 2022-08-11 MED ORDER — PANTOPRAZOLE SODIUM 40 MG PO TBEC
40.0000 mg | DELAYED_RELEASE_TABLET | Freq: Every evening | ORAL | Status: DC
  Administered 2022-08-11 – 2022-08-12 (×2): 40 mg via ORAL
  Filled 2022-08-11 (×2): qty 1

## 2022-08-11 MED ORDER — TAMSULOSIN HCL 0.4 MG PO CAPS
0.4000 mg | ORAL_CAPSULE | Freq: Every day | ORAL | Status: DC
  Administered 2022-08-11 – 2022-08-12 (×2): 0.4 mg via ORAL
  Filled 2022-08-11 (×2): qty 1

## 2022-08-11 MED ORDER — EZETIMIBE 10 MG PO TABS
10.0000 mg | ORAL_TABLET | Freq: Every day | ORAL | Status: DC
  Administered 2022-08-11 – 2022-08-12 (×2): 10 mg via ORAL
  Filled 2022-08-11 (×2): qty 1

## 2022-08-11 MED ORDER — ONDANSETRON HCL 4 MG/2ML IJ SOLN: 4.0000 mg | Freq: Four times a day (QID) | INTRAMUSCULAR | Status: DC | PRN

## 2022-08-11 NOTE — ED Notes (Signed)
Pt c/o increased SHOB. Pt has wet lung sounds, crackles bilaterally, worse on left than right. Pt repositioned and placed on 2L [place].

## 2022-08-11 NOTE — ED Notes (Signed)
Pt had a 3 beat run of v tach. MD made aware, EKG performed

## 2022-08-11 NOTE — ED Notes (Signed)
Pt attempting to get out of bed due to cramp, led back into bed changed and primofit changed

## 2022-08-11 NOTE — H&P (Addendum)
History and Physical    Patient: [name] DOB: [birth year] DOA: 08/11/2022 DOS: the patient was seen and examined on 08/11/2022 PCP: [name], MD  Patient coming from: Home via EMS  Chief Complaint:  Chief Complaint  Patient presents with   Shortness of Breath   HPI: [name] is a 88 y.o. male with medical history significant of HTN, systolic CHF last EF 20 - 25%, s/p ICD, atrial fibrillation/flutter on Eliquis, CAD, CKD stage IV, hypothyroidism, and GERD presents with complaints of progressively worsening shortness of breath.  He reports that he has had intermittent bouts of shortness of breath especially when going to sleep at night where he feels like he cannot catch his breath that has been going on for several weeks.  However, over the weekend symptoms worsened where he was needing to sit up at night.  He chronically has leg swelling and has not noticed any significant change in his weight.  Patient notes that he does eat a [place] breakfast every morning and wife confirms this.  In the emergency department patient was noted to be tachypneic with blood pressures 88/61-106/61.  Labs significant for BNP 997.1 and high-sensitivity troponins 19-> 18.  Influenza and COVID-19 screening were negative.   Patient had been given Lasix 40 mg IV x1 dose. Review of Systems: As mentioned in the history of present illness. All other systems reviewed and are negative. Past Medical History:  Diagnosis Date   Acute bronchitis with COPD ([place]) 09/10/2016   Anemia    Atrial fibrillation or flutter    maintaining sinus on amiodarone     Bladder cancer ([place]) dx'd 05/2012   BPH (benign prostatic hyperplasia)    CAD (coronary artery disease)     a. s/p anterior MI 12/05 c/b shock -> stent LAD   b. s/p stenting OM-1, 2/06   CHF (congestive heart failure) ([place])    due to ischemic CM  a. EF 20-30%. (Nov 2008)   b. s/p St. Jude BiV-ICD    c. CPX 07/2008  pvo2 16.3 (63%  predicted) slope 34 RER 1.08 O2 pulse 93%   Cough    occasional /productive, no fever/ not new   CRI (chronic renal insufficiency)    (baseline 2.0-2.2)/ recent hospitalization 8/13   GERD (gastroesophageal reflux disease)    hx Barretts esophagitis   History of blood transfusion    following coumadin  usage   HTN (hypertension)    EKG 05/14/12,Chest x ray 8/13, last ICD interrogation 8/13 EPIC   Hyperlipidemia    Hypothyroidism    Left ventricular lead failure to capture on the ring electrode 12/16/2013   Major depression, single episode, in complete remission ([place]) 08/16/2016   Neuromuscular disorder ([place])    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"   no neurologist   Osteoarthritis 03/08/2011   Skin cancer    basal cells   facial x 4, 1 right forearm   Sleep apnea    STOP BANG SCORE 4   Past Surgical History:  Procedure Laterality Date   BACK SURGERY  1972   lower back   BIV ICD GENERATOR CHANGEOUT N/A 10/02/2020   Procedure: BIV ICD GENERATOR CHANGEOUT;  Surgeon: Deboraha Sprang, MD;  Location: [place] [facility];  Service: Cardiovascular;  Laterality: N/A;   CARDIAC DEFIBRILLATOR PLACEMENT     ICD St Jude; gen change 09-20-13   CERVICAL LAMINECTOMY  1985   COLONOSCOPY     CORONARY ANGIOPLASTY  2005,2006  CYSTOSCOPY W/ RETROGRADES  05/25/2012   Procedure: CYSTOSCOPY WITH RETROGRADE PYELOGRAM;  Surgeon: Molli Hazard, MD;  Location: [facility];  Service: Urology;  Laterality: Bilateral;   CYSTOSCOPY W/ URETERAL STENT PLACEMENT  07/08/2012   Procedure: CYSTOSCOPY WITH STENT REPLACEMENT;  Surgeon: Molli Hazard, MD;  Location: [facility];  Service: Urology;  Laterality: Left;   CYSTOSCOPY W/ URETERAL STENT REMOVAL  07/08/2012   Procedure: CYSTOSCOPY WITH STENT REMOVAL;  Surgeon: Molli Hazard, MD;  Location: [facility];  Service: Urology;  Laterality: Bilateral;   CYSTOSCOPY/RETROGRADE/URETEROSCOPY  07/08/2012   Procedure: CYSTOSCOPY/RETROGRADE/URETEROSCOPY;  Surgeon: Molli Hazard, MD;  Location: [facility];  Service: Urology;  Laterality: Left;   ESOPHAGOGASTRODUODENOSCOPY     HERNIA REPAIR  1989   bilateral   IMPLANTABLE CARDIOVERTER DEFIBRILLATOR (ICD) GENERATOR CHANGE N/A 09/20/2013   Procedure: ICD GENERATOR CHANGE;  Surgeon: Deboraha Sprang, MD;  Location: [facility] CATH LAB;  Service: Cardiovascular;  Laterality: N/A;   TEE WITHOUT CARDIOVERSION N/A 12/10/2017   Procedure: TRANSESOPHAGEAL ECHOCARDIOGRAM (TEE);  Surgeon: Jerline Pain, MD;  Location[facility] END[facility];  Service: Cardiovascular;  Laterality: N/A;   TRANSURETHRAL RESECTION OF BLADDER TUMOR  05/25/2012   cold cup biopsy prostate   TRANSURETHRAL RESECTION OF BLADDER TUMOR  07/08/2012   Procedure: TRANSURETHRAL RESECTION OF BLADDER TUMOR (TURBT);  Surgeon: Molli Hazard, MD;  Location: [facility];  Service: Urology;  Laterality: N/A;  CYSTO, TURBT W/ GYRUS, BILATERAL STENT REMOVAL, LEFT URETEROSCOPY WITH BIOPSY, POSSIBLE LEFT STENT PLACEMENT    Social History:  reports that he quit smoking about 18 years ago. His smoking use included cigarettes and cigars. He quit smokeless tobacco use about 23 years ago. He reports that he does not drink alcohol and does not use drugs.  Allergies  Allergen Reactions   Amoxicillin Hives and Rash   Gabapentin Itching   Penicillin G Sodium Itching and Rash   Penicillins Itching and Rash    Has patient had a PCN reaction causing immediate rash, facial/tongue/throat swelling, SOB or lightheadedness with hypotension: No Has patient had a PCN reaction causing severe rash involving mucus membranes or skin necrosis: Yes-back and hands. Has patient had a PCN reaction that required hospitalization: No Has patient had a PCN reaction occurring within the last 10 years: Yes If all of the above answers are "NO", then may proceed with Cephalosporin use.   Ramipril Cough    Family History  Problem Relation Age of Onset   Coronary artery disease Other    Stroke Other      Prior to Admission medications   Medication Sig Start Date End Date Taking? Authorizing Provider  acetaminophen (TYLENOL) 500 MG tablet Take 500-1,000 mg by mouth every 6 (six) hours as needed (for pain).    [provider]  apixaban (ELIQUIS) 2.5 MG TABS tablet Take 1 tablet (2.5 mg total) by mouth 2 (two) times daily. 06/27/22   Bensimhon, Shaune Pascal, MD  carboxymethylcellulose (REFRESH PLUS) 0.5 % SOLN Place 1 drop into both eyes 3 (three) times daily as needed (dry/irritated eyes.).    [provider]  carvedilol (COREG) 6.25 MG tablet Take 0.5 tablets (3.125 mg total) by mouth 2 (two) times daily with a meal. 09/09/21   Hongalgi, Lenis Dickinson, MD  cephALEXin (KEFLEX) 500 MG capsule Take 2,000 mg by mouth See admin instructions. Take 2,000 mg by mouth 45 minutes before dental procedures    [provider]  ezetimibe (ZETIA) 10 MG tablet Take 10 mg by mouth  daily with supper.    [provider]  FARXIGA 10 MG TABS tablet TAKE 1 TABLET BY MOUTH DAILY BEFORE BREAKFAST. 08/30/21   Bensimhon, Shaune Pascal, MD  ferrous sulfate 325 (65 FE) MG tablet Take 325 mg by mouth daily with supper.    [provider]  furosemide (LASIX) 20 MG tablet Take 20 mg by mouth 2 (two) times daily.    [provider]  guaiFENesin (MUCINEX) 600 MG 12 hr tablet Take by mouth as needed.    [provider]  KLOR-CON M10 10 MEQ tablet Take 10 mEq by mouth in the morning.    [provider]  Multiple Vitamin (MULTIVITAMIN WITH MINERALS) TABS tablet Take 1 tablet by mouth daily with supper. Centrum Silver    [provider]  nitroGLYCERIN (NITROSTAT) 0.4 MG SL tablet DISSOLVE ONE TABLET UNDER THE TONGUE EVERY 5 MINUTES AS NEEDED FOR CHEST PAIN.  DO NOT EXCEED A TOTAL OF 3 DOSES IN 15 MINUTES 12/26/16   Larey Dresser, MD  ondansetron [facility]) 4 MG tablet Take 4 mg by mouth 3 (three) times daily as needed for nausea or vomiting. 06/10/18   [provider]  pantoprazole (PROTONIX) 40 MG tablet Take 40 mg by mouth every evening. 01/15/11   [provider]  polyethylene glycol powder (GLYCOLAX/MIRALAX) 17 GM/SCOOP powder as needed.    [provider]  rosuvastatin (CRESTOR) 20 MG tablet Take 20 mg by mouth every evening.    [provider]  sodium chloride (OCEAN) 0.65 % SOLN nasal spray Place 1 spray into both nostrils as needed for congestion.    [provider]  SYNTHROID 75 MCG tablet Take 75 mcg by mouth daily before breakfast.    [provider]  tamsulosin (FLOMAX) 0.4 MG CAPS capsule Take 0.4 mg by mouth at bedtime.  11/23/13   [provider]  traMADol (ULTRAM) 50 MG tablet Take 50 mg by mouth as needed.    [provider]  triamcinolone (NASACORT) 55 MCG/ACT AERO nasal inhaler Place 2 sprays into the nose daily as needed.    [provider]    Physical Exam: Vitals:   08/11/22 1145 08/11/22 1239 08/11/22 1245 08/11/22 1300  BP: 98/72 (!) '88/61 92/82 91/66 '$  Pulse: 72 78 71 93  Resp: '18 12 17 17  '$ Temp:      TempSrc:      SpO2: 96% 98% 99% 96%    Constitutional: Elderly male currently in no acute distress Eyes: PERRL, lids and conjunctivae normal ENMT: Mucous membranes are moist. Posterior pharynx clear of any exudate or lesions.  Neck:  JVD present  Respiratory: Normal respiratory effort with crackles appreciated in all fields.  No significant wheezing or rhonchi appreciated. Cardiovascular: Irregular irregular.  +2 pitting bilateral lower extremity edema..  Abdomen: no tenderness, no masses palpated.  Bowel sounds positive.  Musculoskeletal: no clubbing / cyanosis. No joint deformity upper and lower extremities. Good ROM, no contractures. Normal muscle tone.  Skin:  No induration Neurologic: CN 2-12 grossly intact.  Strength 5/5 in all 4.  Psychiatric: Normal judgment and insight. Alert and oriented x 3. Normal mood.   Data Reviewed:  EKG  revealed atrial fibrillation 88 bpm with PVCs  Assessment and Plan: Systolic congestive heart failure exacerbationS/p  BiV ICD Acute on chronic.  Patient presents with complaints of progressively worsening shortness of breath for last couple weeks.  He is not noted to be hypoxic, but was placed on 2 L of  oxygen for comfort.  Noted to have 2+ pitting edema of the bilateral lower extremities with JVD present.  He has been taking his home Lasix as prescribed, but did admit to having [place] breakfast.  Question of symptoms secondary to dietary indiscretions.  Last EF was 20 to 25% with indeterminate diastolic parameters on 5/69. -Admit to a cardiac telemetry bed -Heart failure order set utilized -Strict I&O's and daily weights -Lasix 40 mg IV twice daily -Not on ACE/ARB due to renal insufficiency -Message sent to [facility] treatment for heart failure team to evaluate  Elevated troponin CAD Chronic.  Patient reported having some intermittent chest pains.  High-sensitivity troponin 19-> 18.  Suspect secondary to demand in setting of CHF exacerbation but chronically has been elevated similarly in the past.  Persistent atrial fibrillation on chronic anticoagulation Patient currently rate controlled. -Continue Eliquis and beta-blocker as tolerated -Goal potassium at least 4 and magnesium at least 2.  Essential hypertension Home blood pressure regimen appears to include Coreg 6.25 mg once daily and furosemide 20 mg twice daily. -Continue Coreg as tolerated -Hold p.o. furosemide while receiving IV  Chronic kidney disease stage IV Creatinine 2.11 admission which appears near patient's baseline. -Continue to monitor kidney function daily.  Hypothyroidism -Check TSH -Continue levothyroxine  Mixed hyperlipidemia -Continue Crestor  BPH -Continue Flomax  Prophylaxis:  Eliquis Advance Care Planning:   Code Status: Full Code   Consults: Message sent for cardiology to eval  Family  Communication: Wife updated at bedside  Severity of Illness: The appropriate patient status for this patient is INPATIENT. Inpatient status is judged to be reasonable and necessary in order to provide the required intensity of service to ensure the patient's safety. The patient's presenting symptoms, physical exam findings, and initial radiographic and laboratory data in the context of their chronic comorbidities is felt to place them at high risk for further clinical deterioration. Furthermore, it is not anticipated that the patient will be medically stable for discharge from the hospital within 2 midnights of admission.   * I certify that at the point of admission it is my clinical judgment that the patient will require inpatient hospital care spanning beyond 2 midnights from the point of admission due to high intensity of service, high risk for further deterioration and high frequency of surveillance required.*  Author: Norval Morton, MD 08/11/2022 2:02 PM  For on call review [email or web address].

## 2022-08-11 NOTE — ED Notes (Signed)
Pt's gown changed. Provided warm blankets

## 2022-08-11 NOTE — ED Provider Notes (Signed)
[facility] Provider Note   CSN: [id number] Arrival date & time: 08/11/22  1761     History  Chief Complaint  Patient presents with   Shortness of Breath    [name] is a 88 y.o. male with past medical history significant for CKD stage IV, CHF, A-fib on chronic Eliquis, CAD, bladder cancer, ICD in place who presents to the ED due to progressively worsening shortness of breath for the past few weeks.  Patient states shortness of breath occurs mostly when lying flat.  Admits to some chest tightness associated with the shortness of breath. No exertional component of shortness of breath.  Admits to intermittent cough.  Wife at bedside states he began having lower extremity edema for the past 2 days.  Wife also notes his abdomen has been more distended recently.  No fever or chills.  Patient has been compliant with his Lasix.  Patient follows Dr. Haroldine Laws at the heart failure clinic.  History obtained from patient and past medical records. No interpreter used during encounter.       Home Medications Prior to Admission medications   Medication Sig Start Date End Date Taking? Authorizing Provider  acetaminophen (TYLENOL) 500 MG tablet Take 500-1,000 mg by mouth every 6 (six) hours as needed (for pain).    [provider]  apixaban (ELIQUIS) 2.5 MG TABS tablet Take 1 tablet (2.5 mg total) by mouth 2 (two) times daily. 06/27/22   Bensimhon, Shaune Pascal, MD  carboxymethylcellulose (REFRESH PLUS) 0.5 % SOLN Place 1 drop into both eyes 3 (three) times daily as needed (dry/irritated eyes.).    [provider]  carvedilol (COREG) 6.25 MG tablet Take 0.5 tablets (3.125 mg total) by mouth 2 (two) times daily with a meal. 09/09/21   Hongalgi, Lenis Dickinson, MD  cephALEXin (KEFLEX) 500 MG capsule Take 2,000 mg by mouth See admin instructions. Take 2,000 mg by mouth 45 minutes before dental procedures    [provider]  ezetimibe (ZETIA)  10 MG tablet Take 10 mg by mouth daily with supper.    [provider]  FARXIGA 10 MG TABS tablet TAKE 1 TABLET BY MOUTH DAILY BEFORE BREAKFAST. 08/30/21   Bensimhon, Shaune Pascal, MD  ferrous sulfate 325 (65 FE) MG tablet Take 325 mg by mouth daily with supper.    [provider]  furosemide (LASIX) 20 MG tablet Take 20 mg by mouth 2 (two) times daily.    [provider]  guaiFENesin (MUCINEX) 600 MG 12 hr tablet Take by mouth as needed.    [provider]  KLOR-CON M10 10 MEQ tablet Take 10 mEq by mouth in the morning.    [provider]  Multiple Vitamin (MULTIVITAMIN WITH MINERALS) TABS tablet Take 1 tablet by mouth daily with supper. Centrum Silver    [provider]  nitroGLYCERIN (NITROSTAT) 0.4 MG SL tablet DISSOLVE ONE TABLET UNDER THE TONGUE EVERY 5 MINUTES AS NEEDED FOR CHEST PAIN.  DO NOT EXCEED A TOTAL OF 3 DOSES IN 15 MINUTES 12/26/16   Larey Dresser, MD  ondansetron [name]) 4 MG tablet Take 4 mg by mouth 3 (three) times daily as needed for nausea or vomiting. 06/10/18   [provider]  pantoprazole (PROTONIX) 40 MG tablet Take 40 mg by mouth every evening. 01/15/11   [provider]  polyethylene glycol powder (GLYCOLAX/MIRALAX) 17 GM/SCOOP powder as needed.    [provider]  rosuvastatin (CRESTOR) 20 MG tablet Take 20  mg by mouth every evening.    [provider]  sodium chloride (OCEAN) 0.65 % SOLN nasal spray Place 1 spray into both nostrils as needed for congestion.    [provider]  SYNTHROID 75 MCG tablet Take 75 mcg by mouth daily before breakfast.    [provider]  tamsulosin (FLOMAX) 0.4 MG CAPS capsule Take 0.4 mg by mouth at bedtime.  11/23/13   [provider]  traMADol (ULTRAM) 50 MG tablet Take 50 mg by mouth as needed.    [provider]  triamcinolone (NASACORT) 55 MCG/ACT AERO nasal inhaler Place 2 sprays into the nose daily as needed.     [provider]      Allergies    Amoxicillin, Gabapentin, Penicillin g sodium, Penicillins, and Ramipril    Review of Systems   Review of Systems  Constitutional:  Negative for chills and fever.  Respiratory:  Positive for cough and shortness of breath.   Cardiovascular:  Positive for chest pain and leg swelling.  Gastrointestinal:  Positive for abdominal distention. Negative for abdominal pain, diarrhea, nausea and vomiting.  All other systems reviewed and are negative.   Physical Exam Updated Vital Signs BP 91/66   Pulse 93   Temp 97.9 F (36.6 C)   Resp 17   SpO2 96%  Physical Exam Vitals and nursing note reviewed.  Constitutional:      General: He is not in acute distress.    Appearance: He is not ill-appearing.  HENT:     Head: Normocephalic.  Eyes:     Pupils: Pupils are equal, round, and reactive to light.  Cardiovascular:     Rate and Rhythm: Normal rate. Rhythm irregular.     Pulses: Normal pulses.     Heart sounds: Normal heart sounds. No murmur heard.    No friction rub. No gallop.  Pulmonary:     Effort: Pulmonary effort is normal.     Breath sounds: Normal breath sounds.  Abdominal:     General: Abdomen is flat. There is no distension.     Palpations: Abdomen is soft.     Tenderness: There is no abdominal tenderness. There is no guarding or rebound.  Musculoskeletal:        General: Normal range of motion.     Cervical back: Neck supple.     Comments: 2+ pitting edema bilaterally  Skin:    General: Skin is warm and dry.  Neurological:     General: No focal deficit present.     Mental Status: He is alert.  Psychiatric:        Mood and Affect: Mood normal.        Behavior: Behavior normal.     ED Results / Procedures / Treatments   Labs (all labs ordered are listed, but only abnormal results are displayed) Labs Reviewed  BASIC METABOLIC PANEL - Abnormal; Notable for the following components:      Result Value   Glucose, Bld 112 (*)     Creatinine, Ser 2.11 (*)    GFR, Estimated 30 (*)    All other components within normal limits  PROTIME-INR - Abnormal; Notable for the following components:   Prothrombin Time 16.8 (*)    INR 1.4 (*)    All other components within normal limits  BRAIN NATRIURETIC PEPTIDE - Abnormal; Notable for the following components:   B Natriuretic Peptide 997.1 (*)    All other components within normal limits  TROPONIN I (HIGH SENSITIVITY) -  Abnormal; Notable for the following components:   Troponin I (High Sensitivity) 19 (*)    All other components within normal limits  TROPONIN I (HIGH SENSITIVITY) - Abnormal; Notable for the following components:   Troponin I (High Sensitivity) 18 (*)    All other components within normal limits  RESP PANEL BY RT-PCR (FLU A&B, COVID) ARPGX2  CBC    EKG EKG Interpretation  Date/Time:  Sunday August 11 2022 09:40:16 EDT Ventricular Rate:  88 PR Interval:    QRS Duration: 135 QT Interval:  386 QTC Calculation: 467 R Axis:   185 Text Interpretation: Atrial fibrillation Ventricular premature complex Right bundle branch block Anterior infarct, old No significant change since last tracing Confirmed by Calvert Cantor [phone]) on 08/11/2022 11:24:27 AM  Radiology DG Chest 2 View  Result Date: 08/11/2022 CLINICAL DATA:  Shortness of breath.  Congestive heart failure. EXAM: CHEST - 2 VIEW COMPARISON:  One view chest x-ray 10/10/2021. CT of the abdomen pelvis 09/03/2021 FINDINGS: Heart is enlarged. Mild pulmonary vascular congestion is present without frank edema. Increased fluid present within the left major fissure. No significant airspace disease is present. Atherosclerotic changes are present in the aorta. IMPRESSION: 1. Cardiomegaly and mild pulmonary vascular congestion without frank edema. 2. Increased fluid within the left major fissure. Electronically Signed   By: San Morelle M.D.   On: 08/11/2022 10:07    Procedures Procedures     Medications Ordered in ED Medications  sodium chloride flush (NS) 0.9 % injection 3 mL (has no administration in time range)  acetaminophen (TYLENOL) tablet 650 mg (has no administration in time range)    Or  acetaminophen (TYLENOL) suppository 650 mg (has no administration in time range)  ondansetron (ZOFRAN) tablet 4 mg (has no administration in time range)    Or  ondansetron (ZOFRAN) injection 4 mg (has no administration in time range)  albuterol (PROVENTIL) (2.5 MG/3ML) 0.083% nebulizer solution 2.5 mg (has no administration in time range)  furosemide (LASIX) injection 40 mg (40 mg Intravenous Given 08/11/22 1227)    ED Course/ Medical Decision Making/ A&P                           Medical Decision Making Amount and/or Complexity of Data Reviewed Independent Historian: spouse    Details: Wife at bedside provided history External Data Reviewed: notes.    Details: HF clinic notes Labs: ordered. Decision-making details documented in ED Course. Radiology: ordered and independent interpretation performed. Decision-making details documented in ED Course. ECG/medicine tests: ordered and independent interpretation performed. Decision-making details documented in ED Course.  Risk Prescription drug management. Decision regarding hospitalization.   This patient presents to the ED for concern of SOB, this involves an extensive number of treatment options, and is a complaint that carries with it a high risk of complications and morbidity.  The differential diagnosis includes CHF, PE/DVT, ACS, pneumonia, etc.  88 year old male presents to the ED due to worsening shortness of breath associated with lower extremity edema.  Shortness of breath has been present for the past few weeks.  He admits to orthopnea. Patient has a history of CHF and has been compliant with his Lasix.  Admits to intermittent cough.  No fever or chills.  Upon arrival, patient afebrile, not tachycardic or hypoxic.   Patient in no acute distress.  2+ pitting edema bilaterally.  Routine labs ordered at triage.  Added BNP to rule out CHF exacerbation.  Chest x-ray to  rule out evidence of pneumonia.  COVID to rule out infection.  Low suspicion for PE given patient has been compliant with his Eliquis.  Patient appears fluid overloaded on exam.  IV Lasix given.  Chest x-ray personally reviewed and interpreted which demonstrates cardiomegaly and mild pulmonary vascular congestion.  CBC unremarkable.  No leukocytosis and normal hemoglobin.  BMP significant for elevated creatinine at 2.11 which appears to be around patient's baseline.  EKG demonstrates A-fib.  No signs of acute ischemia. BNP elevated.  Troponin elevated 18 likely due to HF.  COVID/influenza negative.  EKG demonstrates A-fib.  No signs of acute ischemia.  No changes from previous EKG.  1:35 PM Reassessed patient at bedside, patient continues to have soft BP with some hypotensive episodes.  Given patient most likely needs to increase his Lasix for the next few days patient would benefit from being admitted to the hospital for CHF exacerbation to monitor blood pressure in the setting of diuresis.  Will consult hospitalist for admission.  2:02 PM Discussed with Dr. Tamala Julian who agrees to admit patient for further treatment.         Final Clinical Impression(s) / ED Diagnoses Final diagnoses:  Acute on chronic congestive heart failure, unspecified heart failure type [facility])    Rx / DC Orders ED Discharge Orders     None         Karie Kirks 08/11/22 1416    Truddie Hidden, MD 08/11/22 1546

## 2022-08-11 NOTE — ED Triage Notes (Addendum)
Patient arrived by [facility] from home with some fluid noted to abdomen and legs for the past 2 days with exertional SOB. Patient also complains of some chest tightness. Has hx of CHF and taking meds as directed. EMS also administered 200NS pta Patient also reports that the SOB worse when he lays down to sleep at night

## 2022-08-11 NOTE — ED Notes (Signed)
Patient c/o increased SOB, HR 130's MD made aware via secure. Will continue to monitor

## 2022-08-12 DIAGNOSIS — N184 Chronic kidney disease, stage 4 (severe): Secondary | ICD-10-CM | POA: Diagnosis not present

## 2022-08-12 DIAGNOSIS — I5022 Chronic systolic (congestive) heart failure: Secondary | ICD-10-CM | POA: Diagnosis not present

## 2022-08-12 DIAGNOSIS — R7989 Other specified abnormal findings of blood chemistry: Secondary | ICD-10-CM | POA: Diagnosis not present

## 2022-08-12 LAB — BASIC METABOLIC PANEL
Anion gap: 10 (ref 5–15)
BUN: 23 mg/dL (ref 8–23)
CO2: 24 mmol/L (ref 22–32)
Calcium: 8.9 mg/dL (ref 8.9–10.3)
Chloride: 109 mmol/L (ref 98–111)
Creatinine, Ser: 2.22 mg/dL — ABNORMAL HIGH (ref 0.61–1.24)
GFR, Estimated: 28 mL/min — ABNORMAL LOW (ref >60)
Glucose, Bld: 101 mg/dL — ABNORMAL HIGH (ref 70–99)
Potassium: 3.5 mmol/L (ref 3.5–5.1)
Sodium: 143 mmol/L (ref 135–145)

## 2022-08-12 LAB — CBC
HCT: 40.3 % (ref 39.0–52.0)
Hemoglobin: 12.9 g/dL — ABNORMAL LOW (ref 13.0–17.0)
MCH: 29 pg (ref 26.0–34.0)
MCHC: 32 g/dL (ref 30.0–36.0)
MCV: 90.6 fL (ref 80.0–100.0)
Platelets: 167 10*3/uL (ref 150–400)
RBC: 4.45 MIL/uL (ref 4.22–5.81)
RDW: 15.3 % (ref 11.5–15.5)
WBC: 6.6 10*3/uL (ref 4.0–10.5)
nRBC: 0 % (ref 0.0–0.2)

## 2022-08-12 LAB — URINALYSIS, ROUTINE W REFLEX MICROSCOPIC
Bilirubin Urine: NEGATIVE
Glucose, UA: 150 mg/dL — AB
Ketones, ur: NEGATIVE mg/dL
Leukocytes,Ua: NEGATIVE
Nitrite: NEGATIVE
Protein, ur: NEGATIVE mg/dL
Specific Gravity, Urine: 1.01 (ref 1.005–1.030)
pH: 5 (ref 5.0–8.0)

## 2022-08-12 MED ORDER — HALOPERIDOL LACTATE 5 MG/ML IJ SOLN: 1.0000 mg | Freq: Four times a day (QID) | INTRAMUSCULAR | Status: DC | PRN

## 2022-08-12 MED ORDER — POTASSIUM CHLORIDE CRYS ER 10 MEQ PO TBCR
40.0000 meq | EXTENDED_RELEASE_TABLET | Freq: Once | ORAL | Status: AC
  Administered 2022-08-12: 40 meq via ORAL
  Filled 2022-08-12: qty 4

## 2022-08-12 NOTE — ED Notes (Signed)
Patient request for feet to be message for cramps. This [employer]. Will continue

## 2022-08-12 NOTE — Consult Note (Addendum)
Advanced Heart Failure Team Consult Note   Primary Physician: [name], MD PCP-Cardiologist:  None HF Cardiologist: Hebert Soho, DO  Reason for Consultation: acute on chronic systolic heart failure  HPI:    [name] is seen today for evaluation of acute on chronic systolic heart failure at the request of Dr. Tamala Julian, Internal medicine.   [name] is an 88 y.o. caucasian male with a history of atrial fibrillation (not on coumadin 2/2 GIB), chronic renal insufficiency with baseline creatinine about 2.2-2.5, hypertension, hyperlipidemia, systemic tremor, bladder cancer (8/13), coronary artery disease, status post previous large anterior wall myocardial infarction s/p multivessel stenting, systolic CHF with EF 93% s/p St. Jude BiV ICD. 15' found that his LV lead had not been capturing. Reprogrammed device to use to the RV coil which he tolerated well and had a good threshold.   Multiple admissions with A/C CHF exacerbations and URIs.    [name] presented with worsening SOB for a couple of weeks and orthopnea. 2+ BLE edema, BNP 997, CXR: mild congestive failure and small left pleural effusion with associated left basilar atelectasis or infiltrate, HsTrop 19>18, tachypneic and hypotensive on admission. Resp panel (-). Got '40mg'$  IV lasix in ED.   Patient in bed, wife at bedside. Resting SOB improved, on 3L [name]. Gets SOB with activity. Has been compliant with all medications. Some dietary indiscretion, eats sausage biscuit every morning. Denies CP.    Cardiac studies reviewed:  Echo 7/23: EF 20-25%, LV global hypokinesis. RV systolic function mildly reduced. LA/RA mod dilated. Mod TR and MR. Echo 2/19 EF 20-25%  Home Medications Prior to Admission medications   Medication Sig Start Date End Date Taking? Authorizing Provider  acetaminophen (TYLENOL) 500 MG tablet Take 500-1,000 mg by mouth every 6 (six) hours as needed (for pain).   Yes [provider]   apixaban (ELIQUIS) 2.5 MG TABS tablet Take 1 tablet (2.5 mg total) by mouth 2 (two) times daily. 06/27/22  Yes Bensimhon, Shaune Pascal, MD  carboxymethylcellulose (REFRESH PLUS) 0.5 % SOLN Place 1 drop into both eyes 3 (three) times daily as needed (dry/irritated eyes.).   Yes [provider]  carvedilol (COREG) 6.25 MG tablet Take 0.5 tablets (3.125 mg total) by mouth 2 (two) times daily with a meal. Patient taking differently: Take 6.25 mg by mouth daily. With meals 09/09/21  Yes Hongalgi, Lenis Dickinson, MD  cephALEXin (KEFLEX) 500 MG capsule Take 2,000 mg by mouth See admin instructions. Take 2,000 mg by mouth 45 minutes before dental procedures   Yes [provider]  ezetimibe (ZETIA) 10 MG tablet Take 10 mg by mouth daily with supper.   Yes [provider]  FARXIGA 10 MG TABS tablet TAKE 1 TABLET BY MOUTH DAILY BEFORE BREAKFAST. Patient taking differently: Take 10 mg by mouth daily. Before breakfast 08/30/21  Yes Bensimhon, Shaune Pascal, MD  ferrous sulfate 325 (65 FE) MG tablet Take 325 mg by mouth daily with supper.   Yes [provider]  furosemide (LASIX) 20 MG tablet Take 20 mg by mouth 2 (two) times daily.   Yes [provider]  KLOR-CON M10 10 MEQ tablet Take 10 mEq by mouth in the morning.   Yes [provider]  Multiple Vitamin (MULTIVITAMIN WITH MINERALS) TABS tablet Take 1 tablet by mouth daily with supper. Centrum Silver   Yes [provider]  nitroGLYCERIN (NITROSTAT) 0.4 MG SL tablet DISSOLVE ONE TABLET UNDER THE TONGUE EVERY 5 MINUTES AS NEEDED FOR  CHEST PAIN.  DO NOT EXCEED A TOTAL OF 3 DOSES IN 15 MINUTES Patient taking differently: Place 0.4 mg under the tongue every 5 (five) minutes as needed for chest pain. 12/26/16  Yes Larey Dresser, MD  pantoprazole (PROTONIX) 40 MG tablet Take 40 mg by mouth every evening. 01/15/11  Yes [provider]  rosuvastatin (CRESTOR) 20 MG tablet Take 20 mg by mouth every evening.   Yes  [provider]  sodium chloride (OCEAN) 0.65 % SOLN nasal spray Place 1 spray into both nostrils as needed for congestion.   Yes [provider]  SYNTHROID 75 MCG tablet Take 75 mcg by mouth daily before breakfast.   Yes [provider]  tamsulosin (FLOMAX) 0.4 MG CAPS capsule Take 0.4 mg by mouth at bedtime.  11/23/13  Yes [provider]  triamcinolone (NASACORT) 55 MCG/ACT AERO nasal inhaler Place 2 sprays into the nose daily as needed.   Yes [provider]  mometasone (ELOCON) 0.1 % cream Apply topically 2 (two) times daily. Patient not taking: Reported on 08/11/2022 06/27/22   [provider]  potassium chloride 20 MEQ/15ML (10%) SOLN SMARTSIG:7.5-15 By Mouth Daily Patient not taking: Reported on 08/11/2022 07/08/22   [provider]  traMADol (ULTRAM) 50 MG tablet Take 50 mg by mouth as needed. Patient not taking: Reported on 08/11/2022    [provider]   Past Medical History: Past Medical History:  Diagnosis Date   Acute bronchitis with COPD ([place]) 09/10/2016   Anemia    Atrial fibrillation or flutter    maintaining sinus on amiodarone     Bladder cancer ([place]) dx'd 05/2012   BPH (benign prostatic hyperplasia)    CAD (coronary artery disease)     a. s/p anterior MI 12/05 c/b shock -> stent LAD   b. s/p stenting OM-1, 2/06   CHF (congestive heart failure) ([place])    due to ischemic CM  a. EF 20-30%. (Nov 2008)   b. s/p St. Jude BiV-ICD    c. CPX 07/2008  pvo2 16.3 (63% predicted) slope 34 RER 1.08 O2 pulse 93%   Cough    occasional /productive, no fever/ not new   CRI (chronic renal insufficiency)    (baseline 2.0-2.2)/ recent hospitalization 8/13   GERD (gastroesophageal reflux disease)    hx Barretts esophagitis   History of blood transfusion    following coumadin  usage   HTN (hypertension)    EKG 05/14/12,Chest x ray 8/13, last ICD interrogation 8/13 EPIC   Hyperlipidemia    Hypothyroidism    Left  ventricular lead failure to capture on the ring electrode 12/16/2013   Major depression, single episode, in complete remission ([place]) 08/16/2016   Neuromuscular disorder ([place])    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"   no neurologist   Osteoarthritis 03/08/2011   Skin cancer    basal cells   facial x 4, 1 right forearm   Sleep apnea    STOP BANG SCORE 4   Past Surgical History: Past Surgical History:  Procedure Laterality Date   BACK SURGERY  1972   lower back   BIV ICD GENERATOR CHANGEOUT N/A 10/02/2020   Procedure: BIV ICD GENERATOR CHANGEOUT;  Surgeon: Deboraha Sprang, MD;  Location: [facility];  Service: Cardiovascular;  Laterality: N/A;   CARDIAC DEFIBRILLATOR PLACEMENT     ICD St Jude; gen change 09-20-13   CERVICAL LAMINECTOMY  1985   COLONOSCOPY     CORONARY ANGIOPLASTY  2005,2006  CYSTOSCOPY W/ RETROGRADES  05/25/2012   Procedure: CYSTOSCOPY WITH RETROGRADE PYELOGRAM;  Surgeon: Molli Hazard, MD;  Location: [facility];  Service: Urology;  Laterality: Bilateral;   CYSTOSCOPY W/ URETERAL STENT PLACEMENT  07/08/2012   Procedure: CYSTOSCOPY WITH STENT REPLACEMENT;  Surgeon: Molli Hazard, MD;  Location: [facility];  Service: Urology;  Laterality: Left;   CYSTOSCOPY W/ URETERAL STENT REMOVAL  07/08/2012   Procedure: CYSTOSCOPY WITH STENT REMOVAL;  Surgeon: Molli Hazard, MD;  Location: [facility];  Service: Urology;  Laterality: Bilateral;   CYSTOSCOPY/RETROGRADE/URETEROSCOPY  07/08/2012   Procedure: CYSTOSCOPY/RETROGRADE/URETEROSCOPY;  Surgeon: Molli Hazard, MD;  Location: [facility];  Service: Urology;  Laterality: Left;   ESOPHAGOGASTRODUODENOSCOPY     HERNIA REPAIR  1989   bilateral   IMPLANTABLE CARDIOVERTER DEFIBRILLATOR (ICD) GENERATOR CHANGE N/A 09/20/2013   Procedure: ICD GENERATOR CHANGE;  Surgeon: Deboraha Sprang, MD;  Location[facility];  Service: Cardiovascular;  Laterality: N/A;   TEE WITHOUT CARDIOVERSION N/A 12/10/2017   Procedure: TRANSESOPHAGEAL  ECHOCARDIOGRAM (TEE);  Surgeon: Jerline Pain, MD;  Location: [facility];  Service: Cardiovascular;  Laterality: N/A;   TRANSURETHRAL RESECTION OF BLADDER TUMOR  05/25/2012   cold cup biopsy prostate   TRANSURETHRAL RESECTION OF BLADDER TUMOR  07/08/2012   Procedure: TRANSURETHRAL RESECTION OF BLADDER TUMOR (TURBT);  Surgeon: Molli Hazard, MD;  Location: [facility];  Service: Urology;  Laterality: N/A;  CYSTO, TURBT W/ GYRUS, BILATERAL STENT REMOVAL, LEFT URETEROSCOPY WITH BIOPSY, POSSIBLE LEFT STENT PLACEMENT    Family History: Family History  Problem Relation Age of Onset   Coronary artery disease Other    Stroke Other    Social History: Social History   Socioeconomic History   Marital status: Married    Spouse name: Not on file   Number of children: Not on file   Years of education: Not on file   Highest education level: Not on file  Occupational History   Occupation: retired  Tobacco Use   Smoking status: Former    Types: Cigarettes, Cigars    Quit date: 03/02/2004    Years since quitting: 18.4   Smokeless tobacco: Former    Quit date: 05/20/1999   Tobacco comments:    quit in 2005  Vaping Use   Vaping Use: Never used  Substance and Sexual Activity   Alcohol use: No   Drug use: No   Sexual activity: Not on file  Other Topics Concern   Not on file  Social History Narrative   Not on file   Social Determinants of Health   Financial Resource Strain: Not on file  Food Insecurity: Not on file  Transportation Needs: Not on file  Physical Activity: Not on file  Stress: Not on file  Social Connections: Not on file    Allergies:  Allergies  Allergen Reactions   Amoxicillin Hives and Rash   Gabapentin Itching   Penicillin G Sodium Itching and Rash   Penicillins Itching and Rash    Has patient had a PCN reaction causing immediate rash, facial/tongue/throat swelling, SOB or lightheadedness with hypotension: No Has patient had a PCN reaction causing severe rash  involving mucus membranes or skin necrosis: Yes-back and hands. Has patient had a PCN reaction that required hospitalization: No Has patient had a PCN reaction occurring within the last 10 years: Yes If all of the above answers are "NO", then may proceed with Cephalosporin use.   Ramipril Cough   Objective:   Vital Signs:  Temp:  [97.2 F (36.2 C)-98.5 F (36.9 C)] 98.5 F (36.9 C) (10/30 0950) Pulse Rate:  [49-128] 95 (10/30 0950) Resp:  [12-23] 18 (10/30 0950) BP: (88-116)/(57-86) 108/77 (10/30 0950) SpO2:  [93 %-99 %] 99 % (10/30 0950) Weight:  [69.4 kg-72.6 kg] 69.4 kg (10/30 0950) Last BM Date : 08/11/22  Weight change: Filed Weights   08/12/22 0454 08/12/22 0950  Weight: 72.6 kg 69.4 kg   Intake/Output:   Intake/Output Summary (Last 24 hours) at 08/12/2022 1156 Last data filed at 08/11/2022 1624 Gross per 24 hour  Intake --  Output 1450 ml  Net -1450 ml   Physical Exam    General:  Chronically ill, elderly, appearing.  HEENT: normal Neck: supple. JVP ~10 . Carotids 2+ bilat; no bruits. No lymphadenopathy or thyromegaly appreciated. Cor: PMI nondisplaced. Regular rate & rhythm. No rubs, gallops or murmurs. Lungs: clear Abdomen: soft, nontender, nondistended. No hepatosplenomegaly. No bruits or masses. Good bowel sounds. Extremities: no cyanosis, clubbing, rash, edema Neuro: alert & orientedx3, cranial nerves grossly intact. moves all 4 extremities w/o difficulty. Affect pleasant Telemetry  A. fib w/ PVCs 80s (Personally reviewed)   EKG  A fib w/ PVCs, RBBB, 88 bpm Labs   Basic Metabolic Panel: Recent Labs  Lab 08/11/22 0938 08/12/22 0505  NA 141 143  K 3.9 3.5  CL 108 109  CO2 25 24  GLUCOSE 112* 101*  BUN 22 23  CREATININE 2.11* 2.22*  CALCIUM 9.1 8.9   Liver Function Tests: No results for input(s): "AST", "ALT", "ALKPHOS", "BILITOT", "PROT", "ALBUMIN" in the last 168 hours. No results for input(s): "LIPASE", "AMYLASE" in the last 168 hours. No  results for input(s): "AMMONIA" in the last 168 hours.  CBC: Recent Labs  Lab 08/11/22 0938 08/12/22 0505  WBC 5.1 6.6  HGB 13.2 12.9*  HCT 40.9 40.3  MCV 91.9 90.6  PLT 164 167   Cardiac Enzymes: No results for input(s): "CKTOTAL", "CKMB", "CKMBINDEX", "TROPONINI" in the last 168 hours.  BNP: BNP (last 3 results) Recent Labs    10/10/21 1552 04/04/22 1500 08/11/22 0938  BNP 257.7* 525.9* 997.1*   ProBNP (last 3 results) No results for input(s): "PROBNP" in the last 8760 hours.  CBG: No results for input(s): "GLUCAP" in the last 168 hours.  Coagulation Studies: Recent Labs    08/11/22 0938  LABPROT 16.8*  INR 1.4*   Imaging   DG CHEST PORT 1 VIEW  Result Date: 08/11/2022 CLINICAL DATA:  Dyspnea EXAM: PORTABLE CHEST 1 VIEW COMPARISON:  08/11/2022 FINDINGS: Right apical parenchymal scarring is unchanged. Small left pleural effusion with associated left basilar atelectasis or infiltrate is again noted. No pneumothorax. No pleural effusion on the right. Mild cardiomegaly is stable. Left subclavian pacemaker defibrillator is unchanged. Central pulmonary vascular congestion is again identified with development of trace interstitial pulmonary infiltrate most in keeping with interstitial pulmonary edema. IMPRESSION: 1. Development of mild congestive failure. 2. Small left pleural effusion with associated left basilar atelectasis or infiltrate. Electronically Signed   By: Fidela Salisbury M.D.   On: 08/11/2022 20:01    Medications:    Current Medications:  apixaban  2.5 mg Oral BID   ezetimibe  10 mg Oral Q supper   furosemide  40 mg Intravenous BID   levothyroxine  75 mcg Oral QAC breakfast   metoprolol tartrate  12.5 mg Oral BID   pantoprazole  40 mg Oral QPM   rosuvastatin  20 mg Oral QPM   sodium chloride flush  3 mL Intravenous Q12H   tamsulosin  0.4 mg Oral QHS    Infusions:  Patient Profile   [name] is an 88 y.o. caucasian male with a history of  atrial fibrillation (not on coumadin 2/2 GIB), chronic renal insufficiency with baseline creatinine about 2.2-2.5, hypertension, hyperlipidemia, systemic tremor, bladder cancer (8/13), coronary artery disease, status post previous large anterior wall myocardial infarction s/p multivessel stenting, systolic CHF with EF 70% s/p St. Jude BiV ICD.   AHF team asked to see for acute on chronic systolic heart failure.  Assessment/Plan  1. Acute on chronic systolic CHF - Echo 17/79/3903 LVEF 20-25%  - Echo 7/23: 20-25%,  LV global hypokinesis. RV systolic function mildly reduced. LA/RA mod dilated. Mod TR and MR. - Volume status slightly elevated, NYHA III (chronic). Will check ReDS clip early tomorrow  - suspect exacerbation 2/2 dietary indiscretion and CKD. -7lbs? since admission, -1.5 L UOP - Diuresed some in ED, continue '40mg'$  IV lasix through today, will reassess tomorrow.  - BP too low to add valsartan or MRA back, especially with CKD IV. - Continue metoprolol 12.5 BID, on coreg at home - Continue to hold [place] '10mg'$  daily - Strict I&O, daily weights   2. Elevated troponin / CAD - No s/s of ischemia, trend flat HsTrop 19>18 - Off ASA due to Eliquis.  - Continue statin and Zetia. Lipids followed by Dr. Virgina Jock. Goal LDL < 70   3. PAF -> now chronic AF - Remains in rate controlled AF on ECG today  - Saw Dr. Caryl Comes and [name] in [facility] and decided on rate control strategy.  - Denies bleeding. Continue Eliquis 2.5 bid   4. CKD, Stage IV  - Creatinine baseline 2.2-2.3, 2.22 today. Monitor closely with diuresis - Follows with Dr. Joelyn Oms - GDMT limited by this  5. Hypertension - stable - Continue BB  Length of Stay: [place] AGACNP-BC  08/12/2022, 11:56 AM  Advanced Heart Failure Team Pager [phone] (M-F; [place])  Please contact [facility] for night-coverage after hours (4p -7a ) and weekends on [email or web address]

## 2022-08-12 NOTE — Progress Notes (Signed)
Heart Failure Navigator Progress Note  Assessed for Heart & Vascular TOC clinic readiness.  Patient does not meet criteria due to Advanced Heart Failure patient of Dr. Bensimhon.     Reneka Nebergall, BSN, RN Heart Failure Nurse Navigator Secure Chat Only   

## 2022-08-12 NOTE — Progress Notes (Signed)
Mobility Specialist Progress Note:   08/12/22 1547  Mobility  Activity Ambulated with assistance in hallway  Level of Assistance Contact guard assist, steadying assist  Assistive Device Front wheel walker  Distance Ambulated (ft) 500 ft  Activity Response Tolerated well  Mobility Referral Yes  $Mobility charge 1 Mobility   Pt received in bed willing to participate in mobility. No complaints of pain. Left in bed with call bell in reach and all needs met.   [facility] Surveyor, mining Chat only

## 2022-08-12 NOTE — Progress Notes (Signed)
PROGRESS NOTE    [name]  EGB:[id number] DOB: Oct 21, 1931 DOA: 08/11/2022 PCP: [name], MD   [name] is a 88 y.o. male with medical history significant of HTN, systolic CHF last EF 20 - 25%, s/p ICD, atrial fibrillation/flutter on Eliquis, CAD, CKD stage IV, hypothyroidism, and GERD presents with complaints of progressively worsening shortness of breath for 1 to 2 weeks, poor historian, some orthopnea like symptoms, chronic leg swelling. -Patient notes that he does eat a [place] [employer] breakfast every morning and wife confirms this.  -In the emergency department patient was noted to be tachypneic with blood pressures 88/61-106/61.  Labs significant for BNP 997.1 and high-sensitivity troponins 19-> 18.  Influenza and COVID-19 screening were negative Chest x-ray mild CHF  Subjective: -Upset about not getting adequate care in the emergency room  Assessment and Plan:  Acute on chronic Systolic cCHF - worsened by dietary indiscretions, complicated by CKD4- -Last EF was 20 to 25% with indeterminate diastolic parameters on 3/71. -He is 1.4 L negative, continue Lasix 40 mg IV twice daily today, weight is largely unchanged, I wonder if his dry weight needs to be lowered -GDMT limited by CKD4 -Cards consulted on admission -PT eval   CAD -no ACS -On metoprolol continue statin, eliquis  Persistent atrial fibrillation on chronic anticoagulation S/p BiV ICD - rate controlled. -Continue Eliquis, beta-blocker   Essential hypertension -as above, BP soft   Chronic kidney disease stage IV -Creatinine stable, baseline around 2.2-2.7   Hypothyroidism -Continue levothyroxine   Mixed hyperlipidemia -Continue Crestor   BPH -Continue Flomax   DVT prophylaxis:Apixaban Code Status: DNR Family Communication: none present Disposition Plan: Home in 1 to 2 days  Consultants: Cards   Procedures:   Antimicrobials:    Objective: Vitals:   08/12/22 0230 08/12/22 0245  08/12/22 0453 08/12/22 0454  BP:  102/66 91/67   Pulse: 65 88 91   Resp: '19 18 18   '$ Temp:  98.4 F (36.9 C)    TempSrc:      SpO2: 97% 93% 98%   Weight:    72.6 kg  Height:    '5\' 9"'$  (1.753 m)    Intake/Output Summary (Last 24 hours) at 08/12/2022 0532 Last data filed at 08/11/2022 1624 Gross per 24 hour  Intake --  Output 1450 ml  Net -1450 ml   Filed Weights   08/12/22 0454  Weight: 72.6 kg    Examination:  General exam: Appears calm and comfortable  Respiratory system: Clear to auscultation Cardiovascular system: S1 & S2, irregular Abd: nondistended, soft and nontender.Normal bowel sounds heard. Central nervous system: Alert and oriented. No focal neurological deficits. Extremities: 1plus edema Skin: No rashes Psychiatry:  Mood & affect appropriate.     Data Reviewed:   CBC: Recent Labs  Lab 08/11/22 0938 08/12/22 0505  WBC 5.1 6.6  HGB 13.2 12.9*  HCT 40.9 40.3  MCV 91.9 90.6  PLT 164 062   Basic Metabolic Panel: Recent Labs  Lab 08/11/22 0938  NA 141  K 3.9  CL 108  CO2 25  GLUCOSE 112*  BUN 22  CREATININE 2.11*  CALCIUM 9.1   GFR: Estimated Creatinine Clearance: 24.2 mL/min (A) (by C-G formula based on SCr of 2.11 mg/dL (H)). Liver Function Tests: No results for input(s): "AST", "ALT", "ALKPHOS", "BILITOT", "PROT", "ALBUMIN" in the last 168 hours. No results for input(s): "LIPASE", "AMYLASE" in the last 168 hours. No results for input(s): "AMMONIA" in the last 168 hours. Coagulation Profile: Recent  Labs  Lab 08/11/22 0938  INR 1.4*   Cardiac Enzymes: No results for input(s): "CKTOTAL", "CKMB", "CKMBINDEX", "TROPONINI" in the last 168 hours. BNP (last 3 results) No results for input(s): "PROBNP" in the last 8760 hours. HbA1C: No results for input(s): "HGBA1C" in the last 72 hours. CBG: No results for input(s): "GLUCAP" in the last 168 hours. Lipid Profile: No results for input(s): "CHOL", "HDL", "LDLCALC", "TRIG", "CHOLHDL",  "LDLDIRECT" in the last 72 hours. Thyroid Function Tests: No results for input(s): "TSH", "T4TOTAL", "FREET4", "T3FREE", "THYROIDAB" in the last 72 hours. Anemia Panel: No results for input(s): "VITAMINB12", "FOLATE", "FERRITIN", "TIBC", "IRON", "RETICCTPCT" in the last 72 hours. Urine analysis:    Component Value Date/Time   COLORURINE YELLOW 10/10/2021 1924   APPEARANCEUR CLEAR 10/10/2021 1924   LABSPEC 1.013 10/10/2021 1924   PHURINE 5.0 10/10/2021 1924   GLUCOSEU >=500 (A) 10/10/2021 1924   HGBUR NEGATIVE 10/10/2021 1924   BILIRUBINUR NEGATIVE 10/10/2021 1924   KETONESUR NEGATIVE 10/10/2021 1924   PROTEINUR NEGATIVE 10/10/2021 1924   UROBILINOGEN 0.2 08/15/2013 1132   NITRITE NEGATIVE 10/10/2021 1924   LEUKOCYTESUR NEGATIVE 10/10/2021 1924   Sepsis Labs: '@LABRCNTIP'$ (procalcitonin:4,lacticidven:4)  ) Recent Results (from the past 240 hour(s))  Resp Panel by RT-PCR (Flu A&B, Covid) Anterior Nasal Swab     Status: None   Collection Time: 08/11/22 11:38 AM   Specimen: Anterior Nasal Swab  Result Value Ref Range Status   SARS Coronavirus 2 by RT PCR NEGATIVE NEGATIVE Final    Comment: (NOTE) SARS-CoV-2 target nucleic acids are NOT DETECTED.  The SARS-CoV-2 RNA is generally detectable in upper respiratory specimens during the acute phase of infection. The lowest concentration of SARS-CoV-2 viral copies this assay can detect is 138 copies/mL. A negative result does not preclude SARS-Cov-2 infection and should not be used as the sole basis for treatment or other patient management decisions. A negative result may occur with  improper specimen collection/handling, submission of specimen other than nasopharyngeal swab, presence of viral mutation(s) within the areas targeted by this assay, and inadequate number of viral copies(<138 copies/mL). A negative result must be combined with clinical observations, patient history, and epidemiological information. The expected result is  Negative.  Fact Sheet for Patients:  EntrepreneurPulse.com.au  Fact Sheet for Healthcare Providers:  IncredibleEmployment.be  This test is no t yet approved or cleared by the [place] FDA and  has been authorized for detection and/or diagnosis of SARS-CoV-2 by FDA under an Emergency Use Authorization (EUA). This EUA will remain  in effect (meaning this test can be used) for the duration of the COVID-19 declaration under Section 564(b)(1) of the Act, 21 U.S.C.section 360bbb-3(b)(1), unless the authorization is terminated  or revoked sooner.       Influenza A by PCR NEGATIVE NEGATIVE Final   Influenza B by PCR NEGATIVE NEGATIVE Final    Comment: (NOTE) The Xpert Xpress SARS-CoV-2/FLU/RSV plus assay is intended as an aid in the diagnosis of influenza from Nasopharyngeal swab specimens and should not be used as a sole basis for treatment. Nasal washings and aspirates are unacceptable for Xpert Xpress SARS-CoV-2/FLU/RSV testing.  Fact Sheet for Patients: EntrepreneurPulse.com.au  Fact Sheet for Healthcare Providers: IncredibleEmployment.be  This test is not yet approved or cleared by the [place] FDA and has been authorized for detection and/or diagnosis of SARS-CoV-2 by FDA under an Emergency Use Authorization (EUA). This EUA will remain in effect (meaning this test can be used) for the duration of the COVID-19 declaration under Section 564(b)(1)  of the Act, 21 U.S.C. section 360bbb-3(b)(1), unless the authorization is terminated or revoked.  Performed at [facility], [place] [address], [place], [place] [address]      Radiology Studies: DG CHEST PORT 1 VIEW  Result Date: 08/11/2022 CLINICAL DATA:  Dyspnea EXAM: PORTABLE CHEST 1 VIEW COMPARISON:  08/11/2022 FINDINGS: Right apical parenchymal scarring is unchanged. Small left pleural effusion with associated left basilar atelectasis or  infiltrate is again noted. No pneumothorax. No pleural effusion on the right. Mild cardiomegaly is stable. Left subclavian pacemaker defibrillator is unchanged. Central pulmonary vascular congestion is again identified with development of trace interstitial pulmonary infiltrate most in keeping with interstitial pulmonary edema. IMPRESSION: 1. Development of mild congestive failure. 2. Small left pleural effusion with associated left basilar atelectasis or infiltrate. Electronically Signed   By: Fidela Salisbury M.D.   On: 08/11/2022 20:01   DG Chest 2 View  Result Date: 08/11/2022 CLINICAL DATA:  Shortness of breath.  Congestive heart failure. EXAM: CHEST - 2 VIEW COMPARISON:  One view chest x-ray 10/10/2021. CT of the abdomen pelvis 09/03/2021 FINDINGS: Heart is enlarged. Mild pulmonary vascular congestion is present without frank edema. Increased fluid present within the left major fissure. No significant airspace disease is present. Atherosclerotic changes are present in the aorta. IMPRESSION: 1. Cardiomegaly and mild pulmonary vascular congestion without frank edema. 2. Increased fluid within the left major fissure. Electronically Signed   By: San Morelle M.D.   On: 08/11/2022 10:07     Scheduled Meds:  apixaban  2.5 mg Oral BID   ezetimibe  10 mg Oral Q supper   furosemide  40 mg Intravenous BID   levothyroxine  75 mcg Oral QAC breakfast   metoprolol tartrate  12.5 mg Oral BID   pantoprazole  40 mg Oral QPM   rosuvastatin  20 mg Oral QPM   sodium chloride flush  3 mL Intravenous Q12H   tamsulosin  0.4 mg Oral QHS   Continuous Infusions:   LOS: 1 day    Time spent: 6m    PDomenic Polite MD [facility]   08/12/2022, 5:32 AM

## 2022-08-12 NOTE — Consult Note (Signed)
   [facility] Inpatient Consult   08/12/2022  [name] May 29, 1932 [id number]  [place]  Accountable Care Organization [ACO] Patient:  Primary Care Provider:  [name], MD with [facility]  Patient screened for hospitalization as discussed in unit progression meeting to assess for potential Friend service needs for post hospital transition.  1525 Up with mobility specialist Plan:  Continue to follow progress and disposition to assess for post hospital care management needs.    For questions contact:   Natividad Brood, RN BSN [place]  [phone] business mobile phone Toll free office [phone]  *[place]  [phone] Fax number: [phone] [email or web address]

## 2022-08-13 ENCOUNTER — Other Ambulatory Visit (HOSPITAL_COMMUNITY): Payer: Self-pay

## 2022-08-13 DIAGNOSIS — I5022 Chronic systolic (congestive) heart failure: Secondary | ICD-10-CM | POA: Diagnosis not present

## 2022-08-13 LAB — BASIC METABOLIC PANEL
Anion gap: 10 (ref 5–15)
BUN: 28 mg/dL — ABNORMAL HIGH (ref 8–23)
CO2: 27 mmol/L (ref 22–32)
Calcium: 9 mg/dL (ref 8.9–10.3)
Chloride: 103 mmol/L (ref 98–111)
Creatinine, Ser: 2.32 mg/dL — ABNORMAL HIGH (ref 0.61–1.24)
GFR, Estimated: 26 mL/min — ABNORMAL LOW (ref >60)
Glucose, Bld: 109 mg/dL — ABNORMAL HIGH (ref 70–99)
Potassium: 3.6 mmol/L (ref 3.5–5.1)
Sodium: 140 mmol/L (ref 135–145)

## 2022-08-13 LAB — URINE CULTURE: Culture: <10000 — AB

## 2022-08-13 MED ORDER — FUROSEMIDE 20 MG PO TABS
20.0000 mg | ORAL_TABLET | Freq: Two times a day (BID) | ORAL | 1 refills | Status: AC
  Filled 2022-08-13: qty 60, 30d supply, fill #0

## 2022-08-13 MED ORDER — DAPAGLIFLOZIN PROPANEDIOL 10 MG PO TABS: 10.0000 mg | ORAL_TABLET | Freq: Every day | ORAL | Status: DC

## 2022-08-13 MED ORDER — CARVEDILOL 3.125 MG PO TABS: 3.1250 mg | ORAL_TABLET | Freq: Two times a day (BID) | ORAL | Status: DC

## 2022-08-13 NOTE — Evaluation (Signed)
Physical Therapy Evaluation Patient Details Name: [name] MRN: [id number] DOB: 09-Dec-1931 Today's Date: 08/13/2022  History of Present Illness  88 y.o. male with medical history significant of HTN, systolic CHF last EF 20 - 25%, s/p ICD, atrial fibrillation/flutter on Eliquis, CAD, CKD stage IV, hypothyroidism, and GERD, admitted for Acute on chronic Systolic cCHF.  Clinical Impression  Pt admitted with above diagnosis. Ambulates 150 feet with RW on room air, sats maintained 94-98%. Shows reduced LE strength and reduced awareness of this resulting in some difficulty with transfers from lower surfaces. Would benefit from HHPT but likely to refuse. Will continue to follow and progress during admission. Has 24/7 assist from wife, and pt states she is in better shape than he is and can help with minor tasks, which is his baseline apparently.  Pt currently with functional limitations due to the deficits listed below (see PT Problem List). Pt will benefit from skilled PT to increase their independence and safety with mobility to allow discharge to the venue listed below.          Recommendations for follow up therapy are one component of a multi-disciplinary discharge planning process, led by the attending physician.  Recommendations may be updated based on patient status, additional functional criteria and insurance authorization.  Follow Up Recommendations Home health PT (Likely to decline- lacks awareness of deficits; reduced strength and stability compared to reported baseline.)      Assistance Recommended at Discharge Set up Supervision/Assistance  Patient can return home with the following  A little help with walking and/or transfers;Assist for transportation;Help with stairs or ramp for entrance;Assistance with cooking/housework    Equipment Recommendations None recommended by PT  Recommendations for Other Services       Functional Status Assessment Patient has had a recent  decline in their functional status and demonstrates the ability to make significant improvements in function in a reasonable and predictable amount of time.     Precautions / Restrictions Precautions Precautions: Fall Precaution Comments: Reports one fall, no injuries. Last 6 mo. Restrictions Weight Bearing Restrictions: No      Mobility  Bed Mobility Overal bed mobility: Modified Independent             General bed mobility comments: extra time    Transfers Overall transfer level: Needs assistance Equipment used: Rolling walker (2 wheels), None Transfers: Sit to/from Stand Sit to Stand: Min guard           General transfer comment: Min guard to rise from bed, very effortful for patient, required multiple attempts, cues for technique, use of RW upon standing for support. Improved greatly with use of arm rests from recliner.    Ambulation/Gait Ambulation/Gait assistance: Supervision Gait Distance (Feet): 150 Feet Assistive device: Rolling walker (2 wheels), None Gait Pattern/deviations: Step-through pattern, Trunk flexed Gait velocity: slower Gait velocity interpretation: <1.31 ft/sec, indicative of household ambulator   General Gait Details: Slower gait speed, using RW lightly for support with good control. Educated on AD use and placement with ambulation. Short distance in room without AD, no overt LOB however showing reduced stability compared to amb with RW. SpO2 maintained 94-98% throughout on RA with mild DOE towards end of distance.  Stairs            Wheelchair Mobility    Modified Rankin (Stroke Patients Only)       Balance Overall balance assessment: Mild deficits observed, not formally tested  Pertinent Vitals/Pain Pain Assessment Pain Assessment: No/denies pain    Home Living Family/patient expects to be discharged to:: Private residence Living Arrangements: Spouse/significant  other Available Help at Discharge: Family;Available 24 hours/day Type of Home: House Home Access: Stairs to enter Entrance Stairs-Rails: Right Entrance Stairs-Number of Steps: 4 Alternate Level Stairs-Number of Steps: 13 Home Layout: Two level;Able to live on main level with bedroom/bathroom;Laundry or work area in [place]: Conservation officer, nature (2 wheels);Cane - single point;BSC/3in1;Shower seat;Grab bars - tub/shower Additional Comments: work area in basement.    Prior Function Prior Level of Function : Independent/Modified Independent;Driving;History of Falls (last six months)             Mobility Comments: Rare use of cane and RW at night ADLs Comments: States he needs a little help to donne socks sometimes.     Hand Dominance   Dominant Hand: Right    Extremity/Trunk Assessment   Upper Extremity Assessment Upper Extremity Assessment: Defer to OT evaluation    Lower Extremity Assessment Lower Extremity Assessment: Generalized weakness       Communication   Communication: No difficulties  Cognition Arousal/Alertness: Awake/alert Behavior During Therapy: WFL for tasks assessed/performed Overall Cognitive Status: Within Functional Limits for tasks assessed                                 General Comments: Shows reduced awareness of deficits, lacking gross strength in LEs, making it difficult for patient to rise from a seated position        General Comments General comments (skin integrity, edema, etc.): SpO2 94-98% on RA during evaluation    Exercises     Assessment/Plan    PT Assessment Patient needs continued PT services  PT Problem List Decreased strength;Decreased activity tolerance;Decreased mobility;Decreased balance;Decreased knowledge of use of DME;Cardiopulmonary status limiting activity       PT Treatment Interventions DME instruction;Gait training;Stair training;Functional mobility training;Therapeutic  activities;Therapeutic exercise;Balance training;Neuromuscular re-education    PT Goals (Current goals can be found in the Care Plan section)  Acute Rehab PT Goals Patient Stated Goal: Go home without help PT Goal Formulation: With patient Time For Goal Achievement: 08/20/22 Potential to Achieve Goals: Good    Frequency Min 3X/week     Co-evaluation               AM-PAC PT "6 Clicks" Mobility  Outcome Measure Help needed turning from your back to your side while in a flat bed without using bedrails?: None Help needed moving from lying on your back to sitting on the side of a flat bed without using bedrails?: None Help needed moving to and from a bed to a chair (including a wheelchair)?: A Little Help needed standing up from a chair using your arms (e.g., wheelchair or bedside chair)?: A Little Help needed to walk in hospital room?: A Little Help needed climbing 3-5 steps with a railing? : A Little 6 Click Score: 20    End of Session Equipment Utilized During Treatment: Gait belt Activity Tolerance: Patient tolerated treatment well Patient left: in chair;with call bell/phone within reach;with chair alarm set;Other (comment) (MD in room)   PT Visit Diagnosis: Other abnormalities of gait and mobility (R26.89);History of falling (Z91.81);Difficulty in walking, not elsewhere classified (R26.2)    Time: 2979-8921 PT Time Calculation (min) (ACUTE ONLY): 32 min   Charges:   PT Evaluation $PT Eval Low Complexity: 1 Low PT Treatments $Gait  Training: 8-22 mins        Candie Mile, PT, DPT Physical Therapist Acute Rehabilitation Services [place] 08/13/2022, 9:59 AM

## 2022-08-13 NOTE — Progress Notes (Signed)
  Subjective:  Patient ID: [name], male    DOB: 29-Aug-1932,  MRN: [id number]  [name] presents to clinic today for  Chief Complaint  Patient presents with   Nail Problem    Thick painful toenails, 9 week follow up   . New problem(s): None.   PCP is [name], MD , and last visit was  July 19, 2022.  Allergies  Allergen Reactions   Amoxicillin Hives and Rash   Gabapentin Itching   Penicillin G Sodium Itching and Rash   Penicillins Itching and Rash    Has patient had a PCN reaction causing immediate rash, facial/tongue/throat swelling, SOB or lightheadedness with hypotension: No Has patient had a PCN reaction causing severe rash involving mucus membranes or skin necrosis: Yes-back and hands. Has patient had a PCN reaction that required hospitalization: No Has patient had a PCN reaction occurring within the last 10 years: Yes If all of the above answers are "NO", then may proceed with Cephalosporin use.   Ramipril Cough    Review of Systems: Negative except as noted in the HPI.  Objective: No changes noted in today's physical examination.  ATA PECHA is a pleasant 88 y.o. male WD, WN in NAD. AAO x 3.  Neurovascular Examination: CFT <3 seconds b/l LE. Faintly palpable pedal pulses b/l LE. Pedal hair sparse b/l LE. Skin temperature gradient WNL b/l. No pain with calf compression b/l. No edema b/l LE. No cyanosis or clubbing noted b/l LE.  Pt has subjective symptoms of neuropathy. Protective sensation intact 5/5 intact bilaterally with 10g monofilament b/l. Vibratory sensation diminished b/l.  Dermatological:  Pedal skin thin, shiny and atrophic b/l LE.  No open wounds b/l. No interdigital macerations b/l. Toenails 1-5 b/l elongated, thickened, discolored with subungual debris. +Tenderness with dorsal palpation of nailplates. Incurvated nailplate medial border left hallux and medial border right hallux with tenderness to palpation. No erythema, no  edema, no drainage noted. No fluctuance.   Musculoskeletal:  Normal muscle strength 5/5 to all lower extremity muscle groups bilaterally. HAV with bunion deformity noted b/l LE.Marland Kitchen No pain, crepitus or joint limitation noted with ROM b/l LE.  Patient ambulates with cane assistance on today's visit.  Assessment/Plan: 1. Pain due to onychomycosis of toenails of both feet   2. Peripheral polyneuropathy     No orders of the defined types were placed in this encounter.   -Patient's family member present. All questions/concerns addressed on today's visit. -No new findings. No new orders. -Patient to continue soft, supportive shoe gear daily. -Toenails 1-5 b/l were debrided in length and girth with sterile nail nippers and dremel without iatrogenic bleeding.  -Patient/POA to call should there be question/concern in the interim.   Return in about 3 months (around 11/09/2022).  Marzetta Board, DPM

## 2022-08-13 NOTE — Progress Notes (Addendum)
Advanced Heart Failure Rounding Note  PCP-Cardiologist: Dr. Haroldine Laws   Subjective:    1L in [place] charted yesterday. Wt down 2 lb. Renal fx stable, 2.32 c/w baseline. K 3.6  Symptoms much improved. Able to sleep last night w/o orthopnea/PND. Ambulating better w/o dyspnea.   Wants to go home.   Objective:   Weight Range: 68.9 kg Body mass index is 22.43 kg/m.   Vital Signs:   Temp:  [97.8 F (36.6 C)-98.5 F (36.9 C)] 97.8 F (36.6 C) (10/31 0424) Pulse Rate:  [29-158] 80 (10/31 0424) Resp:  [9-34] 14 (10/31 0424) BP: (87-109)/(44-77) 105/57 (10/31 0424) SpO2:  [84 %-100 %] 93 % (10/31 0424) Weight:  [68.9 kg-69.4 kg] 68.9 kg (10/31 0424) Last BM Date : 08/11/22  Weight change: Filed Weights   08/12/22 0454 08/12/22 0950 08/13/22 0424  Weight: 72.6 kg 69.4 kg 68.9 kg    Intake/Output:   Intake/Output Summary (Last 24 hours) at 08/13/2022 0743 Last data filed at 08/13/2022 0424 Gross per 24 hour  Intake 420 ml  Output 1075 ml  Net -655 ml      Physical Exam    General:  Well appearing, elderly male No resp difficulty HEENT: Normal Neck: Supple. JVP ~ 7 cm . Carotids 2+ bilat; no bruits. No lymphadenopathy or thyromegaly appreciated. Cor: PMI nondisplaced. Irregularly irregular rhythm and rate. No rubs, gallops or murmurs. Lungs: Clear Abdomen: Soft, nontender, nondistended. No hepatosplenomegaly. No bruits or masses. Good bowel sounds. Extremities: No cyanosis, clubbing, rash, edema Neuro: Alert & orientedx3, cranial nerves grossly intact. moves all 4 extremities w/o difficulty. Affect pleasant   Telemetry   Chronic Afib, 90s-low 100s   EKG    No new EKG to review   Labs    CBC Recent Labs    08/11/22 0938 08/12/22 0505  WBC 5.1 6.6  HGB 13.2 12.9*  HCT 40.9 40.3  MCV 91.9 90.6  PLT 164 939   Basic Metabolic Panel Recent Labs    08/12/22 0505 08/13/22 0035  NA 143 140  K 3.5 3.6  CL 109 103  CO2 24 27  GLUCOSE 101* 109*  BUN  23 28*  CREATININE 2.22* 2.32*  CALCIUM 8.9 9.0   Liver Function Tests No results for input(s): "AST", "ALT", "ALKPHOS", "BILITOT", "PROT", "ALBUMIN" in the last 72 hours. No results for input(s): "LIPASE", "AMYLASE" in the last 72 hours. Cardiac Enzymes No results for input(s): "CKTOTAL", "CKMB", "CKMBINDEX", "TROPONINI" in the last 72 hours.  BNP: BNP (last 3 results) Recent Labs    10/10/21 1552 04/04/22 1500 08/11/22 0938  BNP 257.7* 525.9* 997.1*    ProBNP (last 3 results) No results for input(s): "PROBNP" in the last 8760 hours.   D-Dimer No results for input(s): "DDIMER" in the last 72 hours. Hemoglobin A1C No results for input(s): "HGBA1C" in the last 72 hours. Fasting Lipid Panel No results for input(s): "CHOL", "HDL", "LDLCALC", "TRIG", "CHOLHDL", "LDLDIRECT" in the last 72 hours. Thyroid Function Tests No results for input(s): "TSH", "T4TOTAL", "T3FREE", "THYROIDAB" in the last 72 hours.  Invalid input(s): "FREET3"  Other results:   Imaging    No results found.   Medications:     Scheduled Medications:  apixaban  2.5 mg Oral BID   ezetimibe  10 mg Oral Q supper   furosemide  40 mg Intravenous BID   levothyroxine  75 mcg Oral QAC breakfast   metoprolol tartrate  12.5 mg Oral BID   pantoprazole  40 mg Oral QPM  rosuvastatin  20 mg Oral QPM   sodium chloride flush  3 mL Intravenous Q12H   tamsulosin  0.4 mg Oral QHS    Infusions:   PRN Medications: acetaminophen **OR** acetaminophen, albuterol, haloperidol lactate, ondansetron **OR** ondansetron (ZOFRAN) IV    Patient Profile   [name] is an 88 y.o. caucasian male with a history of atrial fibrillation (not on coumadin 2/2 GIB), chronic renal insufficiency with baseline creatinine about 2.2-2.5, hypertension, hyperlipidemia, systemic tremor, bladder cancer (8/13), coronary artery disease, status post previous large anterior wall myocardial infarction s/p multivessel stenting,  systolic CHF with EF 48% s/p St. Jude BiV ICD.    AHF team asked to see for acute on chronic systolic heart failure.   Assessment/Plan   1. Acute on chronic systolic CHF - Echo 18/56/3149 LVEF 20-25%  - Echo 7/23: 20-25%,  LV global hypokinesis. RV systolic function mildly reduced. LA/RA mod dilated. Mod TR and MR. - acute exacerbation/ volume overload likely triggered by dietary indiscretion w/ sodium  - good symptomatic response to IV Lasix - Check ReDs to see if further IV Lasix needed today  - Restart home Farxiga 10 mg daily  - No MRA, ARB/ARNi, digoxin w/ CKD. - Restart home Coreg 3.125 mg bid  - If ReDs ok, restart PO Lasix 20 mg bid. Can take extra 20 mg PNR for wt gain.    2. Elevated troponin / CAD - No s/s of ischemia, trend flat HsTrop 19>18 - Off ASA due to Eliquis.  - Continue statin and Zetia.  - Lipids followed by Dr. Virgina Jock. Goal LDL < 70   3. PAF -> now chronic AF - Remains in rate controlled AF on ECG today  - Saw Dr. Caryl Comes and [name] in [facility] and decided on rate control strategy.  - Denies bleeding. Continue Eliquis 2.5 bid   4. CKD, Stage IV  - Creatinine baseline 2.2-2.3, 2.32 today. Monitor closely with diuresis - Follows with Dr. Joelyn Oms - Restart Farxiga 10   5. Hypertension - stable/ controlled  - Continue Coreg    Anticipate likely discharge today but will await ReDs assessment to insure adequately diuresed prior to d/c. AHF team will perform and will leave final recs pending reading.    Length of Stay: 2  Lyda Jester, PA-C  08/13/2022, 7:43 AM  Advanced Heart Failure Team Pager [phone] (M-F; 7a - 5p)  Please contact [facility] for night-coverage after hours (5p -7a ) and weekends on [email or web address]  Patient seen and examined with the above-signed Advanced Practice Provider and/or Housestaff. I personally reviewed laboratory data, imaging studies and relevant notes. I independently examined the patient and formulated the  important aspects of the plan. I have edited the note to reflect any of my changes or salient points. I have personally discussed the plan with the patient and/or family.  Much better after diuresis. Denies SOB, orthopnea or PND. Scr stable.   General:  Elderly. No resp difficulty HEENT: normal Neck: supple. no JVD. Carotids 2+ bilat; no bruits. No lymphadenopathy or thryomegaly appreciated. Cor: PMI nondisplaced. irregular rate & rhythm. No rubs, gallops or murmurs. Lungs: clear Abdomen: soft, nontender, nondistended. No hepatosplenomegaly. No bruits or masses. Good bowel sounds. Extremities: no cyanosis, clubbing, rash, edema Neuro: alert & orientedx3, cranial nerves grossly intact. moves all 4 extremities w/o difficulty. Affect pleasant  Volume status much better. Agree with plan to check ReDS and can d/c home if < 40%.   Glori Bickers, MD  8:18 AM

## 2022-08-13 NOTE — Progress Notes (Signed)
Pt has orders to be discharged. Discharge instructions given and pt has no additional questions at this time. Medication regimen reviewed and pt educated. Pt verbalized understanding and has no additional questions. Telemetry box removed. IV removed and site in good condition. Pt stable and waiting for transportation. 

## 2022-08-13 NOTE — Discharge Summary (Addendum)
Physician Discharge Summary  [name] NAT:[id number] DOB: 1932/01/22 DOA: 08/11/2022  PCP: [name], MD  Admit date: 08/11/2022 Discharge date: 08/13/2022  Time spent: 45 minutes  Recommendations for Outpatient Follow-up:  CHF clinic in 1 to 2 weeks, please check BMP at follow-up   Discharge Diagnoses:  Principal Problem: Acute on chronic systolic CHF (congestive heart failure) (HCC)-EF 25% Active Problems:   CAD, NATIVE VESSEL   Elevated troponin   Atrial fibrillation (HCC)   Chronic anticoagulation   Essential hypertension, benign   CKD (chronic kidney disease) stage 4, GFR 15-29 ml/min (HCC)   ICD (implantable cardioverter-defibrillator) in place (St Jude pacer/ICD)   Hypothyroidism   Mixed hyperlipidemia   Discharge Condition: Improved  Diet recommendation: Close DM, heart healthy  Filed Weights   08/12/22 0454 08/12/22 0950 08/13/22 0424  Weight: 72.6 kg 69.4 kg 68.9 kg    History of present illness:  BENJIMAN [name] is a 88 y.o. male with medical history significant of HTN, systolic CHF last EF 20 - 25%, s/p ICD, atrial fibrillation/flutter on Eliquis, CAD, CKD stage IV, hypothyroidism, and GERD presents with complaints of progressively worsening shortness of breath for 1 to 2 weeks, poor historian, some orthopnea like symptoms, chronic leg swelling. -Patient notes that he does eat a [place] [employer] breakfast every morning and wife confirms this.  -In the emergency department patient was noted to be tachypneic with blood pressures 88/61-106/61.  Labs significant for BNP 997.1 and high-sensitivity troponins 19-> 18.  Influenza and COVID-19 screening were negative Chest x-ray mild CHF  Hospital Course:   Acute on chronic Systolic cCHF - worsened by dietary indiscretions, complicated by CKD4- -Last EF was 20 to 25% with indeterminate diastolic parameters on 4/27. -Diuresed with IV Lasix, overall 2 L negative, clinically improving, weaned off oxygen,  followed by heart failure team -weight on admission was largely unchanged, likely needs dry weight lowered -GDMT limited by CKD4 -Restarted on Lasix 20 Mg twice daily, and PRN for weight gain, Farxiga -Follow-up with heart failure clinic   CAD -no ACS -On metoprolol continue statin, eliquis   Persistent atrial fibrillation on chronic anticoagulation S/p BiV ICD - rate controlled. -Continue Eliquis, beta-blocker   Essential hypertension -as above, BP soft   Chronic kidney disease stage IV -Creatinine stable, baseline around 2.2-2.7   Hypothyroidism -Continue levothyroxine   Mixed hyperlipidemia -Continue Crestor   BPH -Continue Flomax  Consultations: CHF team CHF team  Discharge Exam: Vitals:   08/13/22 0424 08/13/22 0714  BP: (!) 105/57 (!) 93/56  Pulse: 80 76  Resp: 14 15  Temp: 97.8 F (36.6 C) 98.1 F (36.7 C)  SpO2: 93% 94%   General exam: Appears calm and comfortable  Respiratory system: Clear to auscultation Cardiovascular system: S1 & S2, irregular Abd: nondistended, soft and nontender.Normal bowel sounds heard. Central nervous system: Alert and oriented. No focal neurological deficits. Extremities: No edema  Discharge Instructions    Allergies as of 08/13/2022       Reactions   Amoxicillin Hives, Rash   Gabapentin Itching   Penicillin G Sodium Itching, Rash   Penicillins Itching, Rash   Has patient had a PCN reaction causing immediate rash, facial/tongue/throat swelling, SOB or lightheadedness with hypotension: No Has patient had a PCN reaction causing severe rash involving mucus membranes or skin necrosis: Yes-back and hands. Has patient had a PCN reaction that required hospitalization: No Has patient had a PCN reaction occurring within the last 10 years: Yes If all of the above  answers are "NO", then may proceed with Cephalosporin use.   Ramipril Cough        Medication List     STOP taking these medications    mometasone 0.1 %  cream Commonly known as: ELOCON   potassium chloride 20 MEQ/15ML (10%) Soln   traMADol 50 MG tablet Commonly known as: ULTRAM       TAKE these medications    acetaminophen 500 MG tablet Commonly known as: TYLENOL Take 500-1,000 mg by mouth every 6 (six) hours as needed (for pain).   apixaban 2.5 MG Tabs tablet Commonly known as: ELIQUIS Take 1 tablet (2.5 mg total) by mouth 2 (two) times daily.   carboxymethylcellulose 0.5 % Soln Commonly known as: REFRESH PLUS Place 1 drop into both eyes 3 (three) times daily as needed (dry/irritated eyes.).   carvedilol 6.25 MG tablet Commonly known as: COREG Take 0.5 tablets (3.125 mg total) by mouth 2 (two) times daily with a meal. What changed:  how much to take when to take this additional instructions   cephALEXin 500 MG capsule Commonly known as: KEFLEX Take 2,000 mg by mouth See admin instructions. Take 2,000 mg by mouth 45 minutes before dental procedures   ezetimibe 10 MG tablet Commonly known as: ZETIA Take 10 mg by mouth daily with supper.   Farxiga 10 MG Tabs tablet Generic drug: dapagliflozin propanediol TAKE 1 TABLET BY MOUTH DAILY BEFORE BREAKFAST. What changed:  how much to take when to take this additional instructions   ferrous sulfate 325 (65 FE) MG tablet Take 325 mg by mouth daily with supper.   furosemide 20 MG tablet Commonly known as: LASIX Take 1 tablet (20 mg total) by mouth 2 (two) times daily. Take an extra dose of '20mg'$  for weight gain-3lbs in 1 day or 5lbs in 1 week What changed: additional instructions   Klor-Con M10 10 MEQ tablet Generic drug: potassium chloride Take 10 mEq by mouth in the morning.   multivitamin with minerals Tabs tablet Take 1 tablet by mouth daily with supper. Centrum Silver   nitroGLYCERIN 0.4 MG SL tablet Commonly known as: NITROSTAT DISSOLVE ONE TABLET UNDER THE TONGUE EVERY 5 MINUTES AS NEEDED FOR CHEST PAIN.  DO NOT EXCEED A TOTAL OF 3 DOSES IN 15 MINUTES What  changed: See the new instructions.   pantoprazole 40 MG tablet Commonly known as: PROTONIX Take 40 mg by mouth every evening.   rosuvastatin 20 MG tablet Commonly known as: CRESTOR Take 20 mg by mouth every evening.   sodium chloride 0.65 % Soln nasal spray Commonly known as: OCEAN Place 1 spray into both nostrils as needed for congestion.   Synthroid 75 MCG tablet Generic drug: levothyroxine Take 75 mcg by mouth daily before breakfast.   tamsulosin 0.4 MG Caps capsule Commonly known as: FLOMAX Take 0.4 mg by mouth at bedtime.   triamcinolone 55 MCG/ACT Aero nasal inhaler Commonly known as: NASACORT Place 2 sprays into the nose daily as needed.       Allergies  Allergen Reactions   Amoxicillin Hives and Rash   Gabapentin Itching   Penicillin G Sodium Itching and Rash   Penicillins Itching and Rash    Has patient had a PCN reaction causing immediate rash, facial/tongue/throat swelling, SOB or lightheadedness with hypotension: No Has patient had a PCN reaction causing severe rash involving mucus membranes or skin necrosis: Yes-back and hands. Has patient had a PCN reaction that required hospitalization: No Has patient had a PCN reaction occurring within  the last 10 years: Yes If all of the above answers are "NO", then may proceed with Cephalosporin use.   Ramipril Cough    Follow-up Information     [facility] Follow up on 08/27/2022.   Specialty: Cardiology Why: Follow up in the Advanced heart failure clinic 08/27/22 at 0930am  Please bring all medications with you Entrance C, free Valet Contact information: [address] [phone] [place] [phone]                 The results of significant diagnostics from this hospitalization (including imaging, microbiology, ancillary and laboratory) are listed below for reference.    Significant Diagnostic Studies: DG CHEST PORT 1  VIEW  Result Date: 08/11/2022 CLINICAL DATA:  Dyspnea EXAM: PORTABLE CHEST 1 VIEW COMPARISON:  08/11/2022 FINDINGS: Right apical parenchymal scarring is unchanged. Small left pleural effusion with associated left basilar atelectasis or infiltrate is again noted. No pneumothorax. No pleural effusion on the right. Mild cardiomegaly is stable. Left subclavian pacemaker defibrillator is unchanged. Central pulmonary vascular congestion is again identified with development of trace interstitial pulmonary infiltrate most in keeping with interstitial pulmonary edema. IMPRESSION: 1. Development of mild congestive failure. 2. Small left pleural effusion with associated left basilar atelectasis or infiltrate. Electronically Signed   By: Fidela Salisbury M.D.   On: 08/11/2022 20:01   DG Chest 2 View  Result Date: 08/11/2022 CLINICAL DATA:  Shortness of breath.  Congestive heart failure. EXAM: CHEST - 2 VIEW COMPARISON:  One view chest x-ray 10/10/2021. CT of the abdomen pelvis 09/03/2021 FINDINGS: Heart is enlarged. Mild pulmonary vascular congestion is present without frank edema. Increased fluid present within the left major fissure. No significant airspace disease is present. Atherosclerotic changes are present in the aorta. IMPRESSION: 1. Cardiomegaly and mild pulmonary vascular congestion without frank edema. 2. Increased fluid within the left major fissure. Electronically Signed   By: San Morelle M.D.   On: 08/11/2022 10:07    Microbiology: Recent Results (from the past 240 hour(s))  Resp Panel by RT-PCR (Flu A&B, Covid) Anterior Nasal Swab     Status: None   Collection Time: 08/11/22 11:38 AM   Specimen: Anterior Nasal Swab  Result Value Ref Range Status   SARS Coronavirus 2 by RT PCR NEGATIVE NEGATIVE Final    Comment: (NOTE) SARS-CoV-2 target nucleic acids are NOT DETECTED.  The SARS-CoV-2 RNA is generally detectable in upper respiratory specimens during the acute phase of infection. The  lowest concentration of SARS-CoV-2 viral copies this assay can detect is 138 copies/mL. A negative result does not preclude SARS-Cov-2 infection and should not be used as the sole basis for treatment or other patient management decisions. A negative result may occur with  improper specimen collection/handling, submission of specimen other than nasopharyngeal swab, presence of viral mutation(s) within the areas targeted by this assay, and inadequate number of viral copies(<138 copies/mL). A negative result must be combined with clinical observations, patient history, and epidemiological information. The expected result is Negative.  Fact Sheet for Patients:  EntrepreneurPulse.com.au  Fact Sheet for Healthcare Providers:  IncredibleEmployment.be  This test is no t yet approved or cleared by the [place] FDA and  has been authorized for detection and/or diagnosis of SARS-CoV-2 by FDA under an Emergency Use Authorization (EUA). This EUA will remain  in effect (meaning this test can be used) for the duration of the COVID-19 declaration under Section 564(b)(1) of the Act, 21  U.S.C.section 360bbb-3(b)(1), unless the authorization is terminated  or revoked sooner.       Influenza A by PCR NEGATIVE NEGATIVE Final   Influenza B by PCR NEGATIVE NEGATIVE Final    Comment: (NOTE) The Xpert Xpress SARS-CoV-2/FLU/RSV plus assay is intended as an aid in the diagnosis of influenza from Nasopharyngeal swab specimens and should not be used as a sole basis for treatment. Nasal washings and aspirates are unacceptable for Xpert Xpress SARS-CoV-2/FLU/RSV testing.  Fact Sheet for Patients: EntrepreneurPulse.com.au  Fact Sheet for Healthcare Providers: IncredibleEmployment.be  This test is not yet approved or cleared by the [place] FDA and has been authorized for detection and/or diagnosis of SARS-CoV-2 by FDA under  an Emergency Use Authorization (EUA). This EUA will remain in effect (meaning this test can be used) for the duration of the COVID-19 declaration under Section 564(b)(1) of the Act, 21 U.S.C. section 360bbb-3(b)(1), unless the authorization is terminated or revoked.  Performed at [facility], [place] [address], [place], [place] [address]      Labs: Basic Metabolic Panel: Recent Labs  Lab 08/11/22 0938 08/12/22 0505 08/13/22 0035  NA 141 143 140  K 3.9 3.5 3.6  CL 108 109 103  CO2 '25 24 27  '$ GLUCOSE 112* 101* 109*  BUN 22 23 28*  CREATININE 2.11* 2.22* 2.32*  CALCIUM 9.1 8.9 9.0   Liver Function Tests: No results for input(s): "AST", "ALT", "ALKPHOS", "BILITOT", "PROT", "ALBUMIN" in the last 168 hours. No results for input(s): "LIPASE", "AMYLASE" in the last 168 hours. No results for input(s): "AMMONIA" in the last 168 hours. CBC: Recent Labs  Lab 08/11/22 0938 08/12/22 0505  WBC 5.1 6.6  HGB 13.2 12.9*  HCT 40.9 40.3  MCV 91.9 90.6  PLT 164 167   Cardiac Enzymes: No results for input(s): "CKTOTAL", "CKMB", "CKMBINDEX", "TROPONINI" in the last 168 hours. BNP: BNP (last 3 results) Recent Labs    10/10/21 1552 04/04/22 1500 08/11/22 0938  BNP 257.7* 525.9* 997.1*    ProBNP (last 3 results) No results for input(s): "PROBNP" in the last 8760 hours.  CBG: No results for input(s): "GLUCAP" in the last 168 hours.     Signed:  Domenic Polite MD.  [facility] 08/13/2022, 12:18 PM

## 2022-08-13 NOTE — TOC Transition Note (Signed)
Transition of Care [facility]) - CM/SW Discharge Note   Patient Details  Name: [name] MRN: [id number] Date of Birth: 1932/10/12  Transition of Care [facility]) CM/SW Contact:  Zenon Mayo, RN Phone Number: 08/13/2022, 4:17 PM   Clinical Narrative:    Patient is for dc today, wife transporting home, NCM offered choice to wife with Medicare. Gov , she states they had [facility] last time and would like to continue with them this time.  NCM made referral to [facility] with [facility].  Soc will begin 24 to 48 hrs post dc.     Final next level of care: [place] Barriers to Discharge: No Barriers Identified   Patient Goals and CMS Choice Patient states their goals for this hospitalization and ongoing recovery are:: return home CMS Medicare.gov Compare Post Acute Care list provided to:: Patient Represenative (must comment) Choice offered to / list presented to : Spouse  Discharge Placement                       Discharge Plan and Services                DME Arranged: N/A DME Agency: NA       HH Arranged: PT, OT [place] Agency: [place] ([facility]) Date [place]: 08/13/22 Time [place]: [place] Representative spoke with at [place]: [place] ([place]) Interventions     Readmission Risk Interventions     No data to display

## 2022-08-13 NOTE — Progress Notes (Signed)
   08/13/22 1506  Mobility  Activity Ambulated with assistance in hallway  Level of Assistance Contact guard assist, steadying assist  Assistive Device Front wheel walker  Distance Ambulated (ft) 500 ft  Activity Response Tolerated well  Mobility Referral Yes  $Mobility charge 1 Mobility   Mobility Specialist Progress Note  Received pt in chair having no complaints and agreeable to mobility. Pt was asymptomatic throughout ambulation and returned to room w/o fault. Left in chair w/ call bell in reach and all needs met.   Lucious Groves Mobility Specialist

## 2022-08-13 NOTE — Telephone Encounter (Signed)
Attempted phone call to discuss lab results.  No answer and no voicemail.

## 2022-08-13 NOTE — Progress Notes (Signed)
ReDs clip reading preformed in hospital=34%

## 2022-08-13 NOTE — Progress Notes (Signed)
ReDS 34%. Volume status okay.  Restarted [place] and po lasix 20 BID (home dose)  Okay for discharge today and has f/u in HF clinic.

## 2022-08-14 ENCOUNTER — Other Ambulatory Visit: Payer: Self-pay | Admitting: Internal Medicine

## 2022-08-14 DIAGNOSIS — M1990 Unspecified osteoarthritis, unspecified site: Secondary | ICD-10-CM | POA: Diagnosis not present

## 2022-08-14 DIAGNOSIS — E039 Hypothyroidism, unspecified: Secondary | ICD-10-CM | POA: Diagnosis not present

## 2022-08-14 DIAGNOSIS — Z8551 Personal history of malignant neoplasm of bladder: Secondary | ICD-10-CM | POA: Diagnosis not present

## 2022-08-14 DIAGNOSIS — I130 Hypertensive heart and chronic kidney disease with heart failure and stage 1 through stage 4 chronic kidney disease, or unspecified chronic kidney disease: Secondary | ICD-10-CM | POA: Diagnosis not present

## 2022-08-14 DIAGNOSIS — N400 Enlarged prostate without lower urinary tract symptoms: Secondary | ICD-10-CM | POA: Diagnosis not present

## 2022-08-14 DIAGNOSIS — I2510 Atherosclerotic heart disease of native coronary artery without angina pectoris: Secondary | ICD-10-CM | POA: Diagnosis not present

## 2022-08-14 DIAGNOSIS — I4819 Other persistent atrial fibrillation: Secondary | ICD-10-CM | POA: Diagnosis not present

## 2022-08-14 DIAGNOSIS — K219 Gastro-esophageal reflux disease without esophagitis: Secondary | ICD-10-CM | POA: Diagnosis not present

## 2022-08-14 DIAGNOSIS — F329 Major depressive disorder, single episode, unspecified: Secondary | ICD-10-CM | POA: Diagnosis not present

## 2022-08-14 DIAGNOSIS — N184 Chronic kidney disease, stage 4 (severe): Secondary | ICD-10-CM | POA: Diagnosis not present

## 2022-08-14 DIAGNOSIS — E782 Mixed hyperlipidemia: Secondary | ICD-10-CM | POA: Diagnosis not present

## 2022-08-14 DIAGNOSIS — Z87891 Personal history of nicotine dependence: Secondary | ICD-10-CM | POA: Diagnosis not present

## 2022-08-14 DIAGNOSIS — Z7901 Long term (current) use of anticoagulants: Secondary | ICD-10-CM | POA: Diagnosis not present

## 2022-08-14 DIAGNOSIS — R251 Tremor, unspecified: Secondary | ICD-10-CM | POA: Diagnosis not present

## 2022-08-14 DIAGNOSIS — G709 Myoneural disorder, unspecified: Secondary | ICD-10-CM | POA: Diagnosis not present

## 2022-08-14 DIAGNOSIS — I5023 Acute on chronic systolic (congestive) heart failure: Secondary | ICD-10-CM | POA: Diagnosis not present

## 2022-08-14 DIAGNOSIS — J449 Chronic obstructive pulmonary disease, unspecified: Secondary | ICD-10-CM | POA: Diagnosis not present

## 2022-08-14 DIAGNOSIS — Z9181 History of falling: Secondary | ICD-10-CM | POA: Diagnosis not present

## 2022-08-20 DIAGNOSIS — I4819 Other persistent atrial fibrillation: Secondary | ICD-10-CM | POA: Diagnosis not present

## 2022-08-20 DIAGNOSIS — I2510 Atherosclerotic heart disease of native coronary artery without angina pectoris: Secondary | ICD-10-CM | POA: Diagnosis not present

## 2022-08-20 DIAGNOSIS — I5023 Acute on chronic systolic (congestive) heart failure: Secondary | ICD-10-CM | POA: Diagnosis not present

## 2022-08-20 DIAGNOSIS — I130 Hypertensive heart and chronic kidney disease with heart failure and stage 1 through stage 4 chronic kidney disease, or unspecified chronic kidney disease: Secondary | ICD-10-CM | POA: Diagnosis not present

## 2022-08-20 DIAGNOSIS — J449 Chronic obstructive pulmonary disease, unspecified: Secondary | ICD-10-CM | POA: Diagnosis not present

## 2022-08-20 DIAGNOSIS — N184 Chronic kidney disease, stage 4 (severe): Secondary | ICD-10-CM | POA: Diagnosis not present

## 2022-08-21 DIAGNOSIS — N184 Chronic kidney disease, stage 4 (severe): Secondary | ICD-10-CM | POA: Diagnosis not present

## 2022-08-21 DIAGNOSIS — J449 Chronic obstructive pulmonary disease, unspecified: Secondary | ICD-10-CM | POA: Diagnosis not present

## 2022-08-21 DIAGNOSIS — I2510 Atherosclerotic heart disease of native coronary artery without angina pectoris: Secondary | ICD-10-CM | POA: Diagnosis not present

## 2022-08-21 DIAGNOSIS — I4819 Other persistent atrial fibrillation: Secondary | ICD-10-CM | POA: Diagnosis not present

## 2022-08-21 DIAGNOSIS — I130 Hypertensive heart and chronic kidney disease with heart failure and stage 1 through stage 4 chronic kidney disease, or unspecified chronic kidney disease: Secondary | ICD-10-CM | POA: Diagnosis not present

## 2022-08-21 DIAGNOSIS — I5023 Acute on chronic systolic (congestive) heart failure: Secondary | ICD-10-CM | POA: Diagnosis not present

## 2022-08-21 NOTE — Telephone Encounter (Signed)
Spoke with pt's wife, DPR and advised per Dr Caryl Comes labs are normal.  Pt's wife verbalizes understanding and thanked Therapist, sports for the call.

## 2022-08-26 ENCOUNTER — Encounter (HOSPITAL_COMMUNITY): Payer: Self-pay

## 2022-08-26 ENCOUNTER — Ambulatory Visit (HOSPITAL_COMMUNITY)
Admission: RE | Admit: 2022-08-26 | Discharge: 2022-08-26 | Disposition: A | Discharge status: Home / Self Care | Payer: Medicare Other | Source: Ambulatory Visit | Attending: Physician Assistant | Admitting: Physician Assistant

## 2022-08-26 VITALS — BP 92/56 | HR 73 | Wt 159.2 lb

## 2022-08-26 DIAGNOSIS — I480 Paroxysmal atrial fibrillation: Secondary | ICD-10-CM | POA: Insufficient documentation

## 2022-08-26 DIAGNOSIS — Z79899 Other long term (current) drug therapy: Secondary | ICD-10-CM | POA: Insufficient documentation

## 2022-08-26 DIAGNOSIS — I2510 Atherosclerotic heart disease of native coronary artery without angina pectoris: Secondary | ICD-10-CM | POA: Diagnosis not present

## 2022-08-26 DIAGNOSIS — Z7901 Long term (current) use of anticoagulants: Secondary | ICD-10-CM | POA: Insufficient documentation

## 2022-08-26 DIAGNOSIS — I130 Hypertensive heart and chronic kidney disease with heart failure and stage 1 through stage 4 chronic kidney disease, or unspecified chronic kidney disease: Secondary | ICD-10-CM | POA: Diagnosis not present

## 2022-08-26 DIAGNOSIS — I5023 Acute on chronic systolic (congestive) heart failure: Secondary | ICD-10-CM | POA: Diagnosis not present

## 2022-08-26 DIAGNOSIS — N184 Chronic kidney disease, stage 4 (severe): Secondary | ICD-10-CM | POA: Insufficient documentation

## 2022-08-26 DIAGNOSIS — I482 Chronic atrial fibrillation, unspecified: Secondary | ICD-10-CM | POA: Diagnosis not present

## 2022-08-26 DIAGNOSIS — I4819 Other persistent atrial fibrillation: Secondary | ICD-10-CM | POA: Diagnosis not present

## 2022-08-26 DIAGNOSIS — J449 Chronic obstructive pulmonary disease, unspecified: Secondary | ICD-10-CM | POA: Diagnosis not present

## 2022-08-26 DIAGNOSIS — I5022 Chronic systolic (congestive) heart failure: Secondary | ICD-10-CM | POA: Diagnosis not present

## 2022-08-26 LAB — BASIC METABOLIC PANEL
Anion gap: 10 (ref 5–15)
BUN: 27 mg/dL — ABNORMAL HIGH (ref 8–23)
CO2: 27 mmol/L (ref 22–32)
Calcium: 9 mg/dL (ref 8.9–10.3)
Chloride: 103 mmol/L (ref 98–111)
Creatinine, Ser: 2.19 mg/dL — ABNORMAL HIGH (ref 0.61–1.24)
GFR, Estimated: 28 mL/min — ABNORMAL LOW (ref >60)
Glucose, Bld: 83 mg/dL (ref 70–99)
Potassium: 3.8 mmol/L (ref 3.5–5.1)
Sodium: 140 mmol/L (ref 135–145)

## 2022-08-26 LAB — BRAIN NATRIURETIC PEPTIDE: B Natriuretic Peptide: 706.8 pg/mL — ABNORMAL HIGH (ref 0.0–100.0)

## 2022-08-26 NOTE — Progress Notes (Addendum)
Patient ID: [name], male   DOB: [birth year], 88 y.o.   MRN: [id number]    Advanced Heart Failure Clinic Note   PCP: Dr. Virgina Jock  HPI: [name] is a 88 y.o. male with a history of coronary artery disease, status post previous large anterior wall myocardial infarction s/p multivessel stenting, systolic CHF with EF 36% s/p St. Jude BiV ICD.  Remainder of his medical history is notable for atrial fibrillation, hx GI bleed, chronic renal insufficiency with baseline creatinine about 2.2-2.5, hypertension, hyperlipidemia, and a systemic tremor. Bladder cancer 05/2012. Completed BCG treatment.   2015 found that his left ventricular lead has not been capturing suggestive of either fracture of the proximal electrode or an issue at the header. They reprogrammed the device to use to the RV coil which he tolerated well and had a good threshold.   He was admitted in 11/17 for respiratory virus.  Admitted 12/30 - 10/15/16 with A/C CHF in setting of URI. Thought to have mild CHF, so given IV diuresis in house with rapid improvement.   Admitted 2/19 with enterococcus bacteremia thought to be UTI. Treated with daptomycin. TEE EF 20-25% no vegetation mod MR  Has had a rough 6 months. Was in/out of hospital 3x. Weak, unable to walk. Had severe back spasms. Flu A and COVID. Imaging ok. Carvedilol cut back.   Has been back in AF since 4/23. Saw Dr. Caryl Comes and [name] in [facility] and decided on rate control strategy.   Admitted 10/23 with a/c CHF in setting of indiscretion with sodium intake. He was diuresed with IV lasix. Initially coreg and farxiga held, but added back on discharge. ReDS at discharge 34%.  He is here today for hospital f/u. Home weight has been stable at 153 lb over the last week. Dyspnea significantly improved. No orthopnea, PND or LE edema. Taking meds as prescribed. Has cut back on [employer] breakfast sandwiches. Reports eating country fried steak every week and enjoys  pizza.     Review of systems complete and found to be negative unless listed in HPI.    Past Medical History:  Diagnosis Date   Acute bronchitis with COPD ([place]) 09/10/2016   Anemia    Atrial fibrillation or flutter    maintaining sinus on amiodarone     Bladder cancer ([facility]) dx'd 05/2012   BPH (benign prostatic hyperplasia)    CAD (coronary artery disease)     a. s/p anterior MI 12/05 c/b shock -> stent LAD   b. s/p stenting OM-1, 2/06   CHF (congestive heart failure) ([place])    due to ischemic CM  a. EF 20-30%. (Nov 2008)   b. s/p St. Jude BiV-ICD    c. 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/16/2013   Major depression, single episode, in complete remission ([place]) 08/16/2016   Neuromuscular disorder ([place])    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"   no neurologist  Osteoarthritis 03/08/2011   Skin cancer    basal cells   facial x 4, 1 right forearm   Sleep apnea    STOP BANG SCORE 4    Current Outpatient Medications  Medication Sig Dispense Refill   acetaminophen (TYLENOL) 500 MG tablet Take 500-1,000 mg by mouth every 6 (six) hours as needed (for pain).     apixaban (ELIQUIS) 2.5 MG TABS tablet Take 1 tablet (2.5 mg total) by mouth 2 (two) times daily. 60 tablet 11   carboxymethylcellulose (REFRESH PLUS) 0.5 % SOLN Place 1 drop into both eyes 3 (three) times daily as needed (dry/irritated eyes.).     carvedilol (COREG) 6.25 MG tablet Take 0.5 tablets (3.125 mg total) by mouth 2 (two) times daily with a  meal. (Patient taking differently: Take 6.25 mg by mouth daily. With meals) 30 tablet 1   cephALEXin (KEFLEX) 500 MG capsule Take 2,000 mg by mouth See admin instructions. Take 2,000 mg by mouth 45 minutes before dental procedures     ezetimibe (ZETIA) 10 MG tablet Take 10 mg by mouth daily with supper.     FARXIGA 10 MG TABS tablet TAKE 1 TABLET BY MOUTH DAILY BEFORE BREAKFAST. (Patient taking differently: Take 10 mg by mouth daily. Before breakfast) 90 tablet 3   ferrous sulfate 325 (65 FE) MG tablet Take 325 mg by mouth daily with supper.     furosemide (LASIX) 20 MG tablet Take 1 tablet (20 mg total) by mouth 2 (two) times daily. Take an extra dose of '20mg'$  for weight gain-3lbs in 1 day or 5lbs in 1 week 60 tablet 1   KLOR-CON M10 10 MEQ tablet Take 10 mEq by mouth in the morning.     Multiple Vitamin (MULTIVITAMIN WITH MINERALS) TABS tablet Take 1 tablet by mouth daily with supper. Centrum Silver     nitroGLYCERIN (NITROSTAT) 0.4 MG SL tablet DISSOLVE ONE TABLET UNDER THE TONGUE EVERY 5 MINUTES AS NEEDED FOR CHEST PAIN.  DO NOT EXCEED A TOTAL OF 3 DOSES IN 15 MINUTES (Patient taking differently: Place 0.4 mg under the tongue every 5 (five) minutes as needed for chest pain.) 25 tablet 0   pantoprazole (PROTONIX) 40 MG tablet Take 40 mg by mouth every evening.     rosuvastatin (CRESTOR) 20 MG tablet Take 20 mg by mouth every evening.     sodium chloride (OCEAN) 0.65 % SOLN nasal spray Place 1 spray into both nostrils as needed for congestion.     SYNTHROID 75 MCG tablet TAKE 1 TABLET BY MOUTH DAILY BEFORE BREAKFAST. 90 tablet 1   tamsulosin (FLOMAX) 0.4 MG CAPS capsule Take 0.4 mg by mouth at bedtime.      triamcinolone (NASACORT) 55 MCG/ACT AERO nasal inhaler Place 2 sprays into the nose daily as needed.     No current facility-administered medications for this encounter.   PHYSICAL EXAM: Vitals:   08/26/22 1338  BP: (!) 92/56  Pulse: 73  SpO2: 97%  Weight: 72.2 kg (159 lb 3.2 oz)      Wt Readings from Last 3 Encounters:  08/26/22 72.2 kg (159 lb 3.2 oz)  08/13/22 68.9 kg (151 lb 14.4 oz)  04/04/22 74 kg (163 lb 3.2 oz)    General:  Elderly male in no distress. Ambulated into clinic. HEENT: normal Neck: supple. no JVD. Carotids 2+ bilat; no bruits.  Cor: PMI nondisplaced. Irregular rhythm. No rubs, gallops or murmurs. Lungs: clear Abdomen: soft, nontender, nondistended.  Extremities: no cyanosis, clubbing, rash, trace edema  Neuro: alert & orientedx3. Affect pleasant  Device check today: BiV paced 44%, Thoracic impedance above threshold   ASSESSMENT & PLAN:  1. Chronic systolic CHF - S/p BIV ICD - Echo 10/13/2017 LVEF 20-25%  - Echo 2/19 EF 20-25% - Echo 07/23: EF 20-25%, RV mildly reduced, moderate MR - Stable chronic NYHA III - Volume status ok. ReDS 30%. Thoracic impedance above threshold. - Continue lasix 20 mg bid. Limit sodium intake, likely reason for recent admission. However, also note, % of Bi-V pacing has decreased since back in AF. Rate control strategy has been opted. - BP too low to add ARB or MRA back especially with CKD IV. - Continue coreg 3.125 mg BID. (cut back due to low BP) - Continue Farxiga 10 - Check labs today  2. CAD - No s/s of ischemia  - Off ASA due to Eliquis.  - Continue statin + zetia. Lipids followed by Dr. Virgina Jock. Goal LDL < 70  3. PAF -> now chronic AF - Remains in rate controlled AF on ECG today  - Saw Dr. Caryl Comes and [name] in [facility] and decided on rate control strategy.  - Denies bleeding. Continue Eliquis 2.5 bid   4. CKD, Stage IV  - Creatinine baseline 2.2-2.3  - Follows with Dr. Joelyn Oms - Continue Farxiga - Labs today  Follow-up: 3 months with APP to assess volume   Dorathy Stallone N, PA-C  1:39 PM

## 2022-08-26 NOTE — Progress Notes (Signed)
ReDS Vest / Clip - 08/26/22 1410       ReDS Vest / Clip   Station Marker C    Ruler Value 29    ReDS Value Range Low volume    ReDS Actual Value 30    Anatomical Comments sitting

## 2022-08-26 NOTE — Patient Instructions (Signed)
Thank you for coming in today  Labs were done today, if any labs are abnormal the clinic will call you No news is good news  Your physician recommends that you schedule a follow-up appointment in:  3 months in clinic    Do the following things EVERYDAY: Weigh yourself in the morning before breakfast. Write it down and keep it in a log. Take your medicines as prescribed Eat low salt foods--Limit salt (sodium) to 2000 mg per day.  Stay as active as you can everyday Limit all fluids for the day to less than 2 liters  At the [facility], you and your health needs are our priority. As part of our continuing mission to provide you with exceptional heart care, we have created designated Provider Care Teams. These Care Teams include your primary Cardiologist (physician) and Advanced Practice Providers (APPs- Physician Assistants and Nurse Practitioners) who all work together to provide you with the care you need, when you need it.   You may see any of the following providers on your designated Care Team at your next follow up: Dr Glori Bickers Dr Loralie Champagne Dr. Roxana Hires, NP Lyda Jester, [place] [facility] [place], [place] Forestine Na, NP Audry Riles, PharmD   Please be sure to bring in all your medications bottles to every appointment.   If you have any questions or concerns before your next appointment please send [place] a message through [place] or call our office at [phone].    TO LEAVE A MESSAGE FOR THE NURSE SELECT OPTION 2, PLEASE LEAVE A MESSAGE INCLUDING: YOUR NAME DATE OF BIRTH CALL BACK NUMBER REASON FOR CALL**this is important as we prioritize the call backs  YOU WILL RECEIVE A CALL BACK THE SAME DAY AS LONG AS YOU CALL BEFORE 4:00 PM

## 2022-08-27 ENCOUNTER — Encounter (HOSPITAL_COMMUNITY): Payer: Medicare Other

## 2022-08-27 DIAGNOSIS — J449 Chronic obstructive pulmonary disease, unspecified: Secondary | ICD-10-CM | POA: Diagnosis not present

## 2022-08-27 DIAGNOSIS — I5023 Acute on chronic systolic (congestive) heart failure: Secondary | ICD-10-CM | POA: Diagnosis not present

## 2022-08-27 DIAGNOSIS — I4819 Other persistent atrial fibrillation: Secondary | ICD-10-CM | POA: Diagnosis not present

## 2022-08-27 DIAGNOSIS — N184 Chronic kidney disease, stage 4 (severe): Secondary | ICD-10-CM | POA: Diagnosis not present

## 2022-08-27 DIAGNOSIS — I130 Hypertensive heart and chronic kidney disease with heart failure and stage 1 through stage 4 chronic kidney disease, or unspecified chronic kidney disease: Secondary | ICD-10-CM | POA: Diagnosis not present

## 2022-08-27 DIAGNOSIS — I2510 Atherosclerotic heart disease of native coronary artery without angina pectoris: Secondary | ICD-10-CM | POA: Diagnosis not present

## 2022-08-29 DIAGNOSIS — I4819 Other persistent atrial fibrillation: Secondary | ICD-10-CM | POA: Diagnosis not present

## 2022-08-29 DIAGNOSIS — I2510 Atherosclerotic heart disease of native coronary artery without angina pectoris: Secondary | ICD-10-CM | POA: Diagnosis not present

## 2022-08-29 DIAGNOSIS — J449 Chronic obstructive pulmonary disease, unspecified: Secondary | ICD-10-CM | POA: Diagnosis not present

## 2022-08-29 DIAGNOSIS — I5023 Acute on chronic systolic (congestive) heart failure: Secondary | ICD-10-CM | POA: Diagnosis not present

## 2022-08-29 DIAGNOSIS — N184 Chronic kidney disease, stage 4 (severe): Secondary | ICD-10-CM | POA: Diagnosis not present

## 2022-08-29 DIAGNOSIS — I130 Hypertensive heart and chronic kidney disease with heart failure and stage 1 through stage 4 chronic kidney disease, or unspecified chronic kidney disease: Secondary | ICD-10-CM | POA: Diagnosis not present

## 2022-09-02 DIAGNOSIS — I5023 Acute on chronic systolic (congestive) heart failure: Secondary | ICD-10-CM | POA: Diagnosis not present

## 2022-09-02 DIAGNOSIS — I2510 Atherosclerotic heart disease of native coronary artery without angina pectoris: Secondary | ICD-10-CM | POA: Diagnosis not present

## 2022-09-02 DIAGNOSIS — N184 Chronic kidney disease, stage 4 (severe): Secondary | ICD-10-CM | POA: Diagnosis not present

## 2022-09-02 DIAGNOSIS — I4819 Other persistent atrial fibrillation: Secondary | ICD-10-CM | POA: Diagnosis not present

## 2022-09-02 DIAGNOSIS — J449 Chronic obstructive pulmonary disease, unspecified: Secondary | ICD-10-CM | POA: Diagnosis not present

## 2022-09-02 DIAGNOSIS — I130 Hypertensive heart and chronic kidney disease with heart failure and stage 1 through stage 4 chronic kidney disease, or unspecified chronic kidney disease: Secondary | ICD-10-CM | POA: Diagnosis not present

## 2022-09-09 ENCOUNTER — Encounter: Payer: Self-pay | Admitting: Internal Medicine

## 2022-09-09 DIAGNOSIS — I4819 Other persistent atrial fibrillation: Secondary | ICD-10-CM | POA: Diagnosis not present

## 2022-09-09 DIAGNOSIS — I5023 Acute on chronic systolic (congestive) heart failure: Secondary | ICD-10-CM | POA: Diagnosis not present

## 2022-09-09 DIAGNOSIS — I2510 Atherosclerotic heart disease of native coronary artery without angina pectoris: Secondary | ICD-10-CM | POA: Diagnosis not present

## 2022-09-09 DIAGNOSIS — N184 Chronic kidney disease, stage 4 (severe): Secondary | ICD-10-CM | POA: Diagnosis not present

## 2022-09-09 DIAGNOSIS — I130 Hypertensive heart and chronic kidney disease with heart failure and stage 1 through stage 4 chronic kidney disease, or unspecified chronic kidney disease: Secondary | ICD-10-CM | POA: Diagnosis not present

## 2022-09-09 DIAGNOSIS — J449 Chronic obstructive pulmonary disease, unspecified: Secondary | ICD-10-CM | POA: Diagnosis not present

## 2022-09-10 ENCOUNTER — Other Ambulatory Visit (HOSPITAL_COMMUNITY): Payer: Self-pay | Admitting: Internal Medicine

## 2022-09-10 DIAGNOSIS — J449 Chronic obstructive pulmonary disease, unspecified: Secondary | ICD-10-CM | POA: Diagnosis not present

## 2022-09-10 DIAGNOSIS — I5023 Acute on chronic systolic (congestive) heart failure: Secondary | ICD-10-CM | POA: Diagnosis not present

## 2022-09-10 DIAGNOSIS — I4819 Other persistent atrial fibrillation: Secondary | ICD-10-CM | POA: Diagnosis not present

## 2022-09-10 DIAGNOSIS — I2510 Atherosclerotic heart disease of native coronary artery without angina pectoris: Secondary | ICD-10-CM | POA: Diagnosis not present

## 2022-09-10 DIAGNOSIS — I130 Hypertensive heart and chronic kidney disease with heart failure and stage 1 through stage 4 chronic kidney disease, or unspecified chronic kidney disease: Secondary | ICD-10-CM | POA: Diagnosis not present

## 2022-09-10 DIAGNOSIS — N184 Chronic kidney disease, stage 4 (severe): Secondary | ICD-10-CM | POA: Diagnosis not present

## 2022-09-13 DIAGNOSIS — M1990 Unspecified osteoarthritis, unspecified site: Secondary | ICD-10-CM | POA: Diagnosis not present

## 2022-09-13 DIAGNOSIS — N184 Chronic kidney disease, stage 4 (severe): Secondary | ICD-10-CM | POA: Diagnosis not present

## 2022-09-13 DIAGNOSIS — Z9181 History of falling: Secondary | ICD-10-CM | POA: Diagnosis not present

## 2022-09-13 DIAGNOSIS — F329 Major depressive disorder, single episode, unspecified: Secondary | ICD-10-CM | POA: Diagnosis not present

## 2022-09-13 DIAGNOSIS — K219 Gastro-esophageal reflux disease without esophagitis: Secondary | ICD-10-CM | POA: Diagnosis not present

## 2022-09-13 DIAGNOSIS — I4819 Other persistent atrial fibrillation: Secondary | ICD-10-CM | POA: Diagnosis not present

## 2022-09-13 DIAGNOSIS — I2510 Atherosclerotic heart disease of native coronary artery without angina pectoris: Secondary | ICD-10-CM | POA: Diagnosis not present

## 2022-09-13 DIAGNOSIS — I5023 Acute on chronic systolic (congestive) heart failure: Secondary | ICD-10-CM | POA: Diagnosis not present

## 2022-09-13 DIAGNOSIS — R251 Tremor, unspecified: Secondary | ICD-10-CM | POA: Diagnosis not present

## 2022-09-13 DIAGNOSIS — E039 Hypothyroidism, unspecified: Secondary | ICD-10-CM | POA: Diagnosis not present

## 2022-09-13 DIAGNOSIS — Z8551 Personal history of malignant neoplasm of bladder: Secondary | ICD-10-CM | POA: Diagnosis not present

## 2022-09-13 DIAGNOSIS — Z87891 Personal history of nicotine dependence: Secondary | ICD-10-CM | POA: Diagnosis not present

## 2022-09-13 DIAGNOSIS — E782 Mixed hyperlipidemia: Secondary | ICD-10-CM | POA: Diagnosis not present

## 2022-09-13 DIAGNOSIS — G709 Myoneural disorder, unspecified: Secondary | ICD-10-CM | POA: Diagnosis not present

## 2022-09-13 DIAGNOSIS — Z7901 Long term (current) use of anticoagulants: Secondary | ICD-10-CM | POA: Diagnosis not present

## 2022-09-13 DIAGNOSIS — J449 Chronic obstructive pulmonary disease, unspecified: Secondary | ICD-10-CM | POA: Diagnosis not present

## 2022-09-13 DIAGNOSIS — I130 Hypertensive heart and chronic kidney disease with heart failure and stage 1 through stage 4 chronic kidney disease, or unspecified chronic kidney disease: Secondary | ICD-10-CM | POA: Diagnosis not present

## 2022-09-13 DIAGNOSIS — N400 Enlarged prostate without lower urinary tract symptoms: Secondary | ICD-10-CM | POA: Diagnosis not present

## 2022-09-17 DIAGNOSIS — N184 Chronic kidney disease, stage 4 (severe): Secondary | ICD-10-CM | POA: Diagnosis not present

## 2022-09-17 DIAGNOSIS — I129 Hypertensive chronic kidney disease with stage 1 through stage 4 chronic kidney disease, or unspecified chronic kidney disease: Secondary | ICD-10-CM | POA: Diagnosis not present

## 2022-09-17 DIAGNOSIS — I509 Heart failure, unspecified: Secondary | ICD-10-CM | POA: Diagnosis not present

## 2022-09-17 DIAGNOSIS — D472 Monoclonal gammopathy: Secondary | ICD-10-CM | POA: Diagnosis not present

## 2022-09-18 DIAGNOSIS — I5023 Acute on chronic systolic (congestive) heart failure: Secondary | ICD-10-CM | POA: Diagnosis not present

## 2022-09-18 DIAGNOSIS — I130 Hypertensive heart and chronic kidney disease with heart failure and stage 1 through stage 4 chronic kidney disease, or unspecified chronic kidney disease: Secondary | ICD-10-CM | POA: Diagnosis not present

## 2022-09-18 DIAGNOSIS — N184 Chronic kidney disease, stage 4 (severe): Secondary | ICD-10-CM | POA: Diagnosis not present

## 2022-09-18 DIAGNOSIS — I2510 Atherosclerotic heart disease of native coronary artery without angina pectoris: Secondary | ICD-10-CM | POA: Diagnosis not present

## 2022-09-18 DIAGNOSIS — J449 Chronic obstructive pulmonary disease, unspecified: Secondary | ICD-10-CM | POA: Diagnosis not present

## 2022-09-18 DIAGNOSIS — I4819 Other persistent atrial fibrillation: Secondary | ICD-10-CM | POA: Diagnosis not present

## 2022-09-25 DIAGNOSIS — I5023 Acute on chronic systolic (congestive) heart failure: Secondary | ICD-10-CM | POA: Diagnosis not present

## 2022-09-25 DIAGNOSIS — I130 Hypertensive heart and chronic kidney disease with heart failure and stage 1 through stage 4 chronic kidney disease, or unspecified chronic kidney disease: Secondary | ICD-10-CM | POA: Diagnosis not present

## 2022-09-25 DIAGNOSIS — J449 Chronic obstructive pulmonary disease, unspecified: Secondary | ICD-10-CM | POA: Diagnosis not present

## 2022-09-25 DIAGNOSIS — I4819 Other persistent atrial fibrillation: Secondary | ICD-10-CM | POA: Diagnosis not present

## 2022-09-25 DIAGNOSIS — I2510 Atherosclerotic heart disease of native coronary artery without angina pectoris: Secondary | ICD-10-CM | POA: Diagnosis not present

## 2022-09-25 DIAGNOSIS — N184 Chronic kidney disease, stage 4 (severe): Secondary | ICD-10-CM | POA: Diagnosis not present

## 2022-09-30 ENCOUNTER — Ambulatory Visit (INDEPENDENT_AMBULATORY_CARE_PROVIDER_SITE_OTHER): Payer: Medicare Other

## 2022-09-30 DIAGNOSIS — I255 Ischemic cardiomyopathy: Secondary | ICD-10-CM

## 2022-10-01 LAB — CUP PACEART REMOTE DEVICE CHECK
Battery Remaining Longevity: 48 mo
Battery Remaining Percentage: 65 %
Battery Voltage: 2.95 V
Brady Statistic AP VP Percent: 79 %
Brady Statistic AP VS Percent: 2.1 %
Brady Statistic AS VP Percent: 16 %
Brady Statistic AS VS Percent: 1.7 %
Brady Statistic RA Percent Paced: 8.7 %
Date Time Interrogation Session: 20231218020016
HighPow Impedance: 47 Ohm
HighPow Impedance: 47 Ohm
Implantable Lead Connection Status: 753985
Implantable Lead Connection Status: 753985
Implantable Lead Connection Status: 753985
Implantable Lead Implant Date: 20060509
Implantable Lead Implant Date: 20060509
Implantable Lead Implant Date: 20090417
Implantable Lead Location: 753858
Implantable Lead Location: 753859
Implantable Lead Location: 753860
Implantable Lead Model: 5076
Implantable Lead Model: 7001
Implantable Pulse Generator Implant Date: 20211220
Lead Channel Impedance Value: 360 Ohm
Lead Channel Impedance Value: 390 Ohm
Lead Channel Impedance Value: 430 Ohm
Lead Channel Pacing Threshold Amplitude: 0.75 V
Lead Channel Pacing Threshold Amplitude: 1.5 V
Lead Channel Pacing Threshold Amplitude: 1.625 V
Lead Channel Pacing Threshold Pulse Width: 0.5 ms
Lead Channel Pacing Threshold Pulse Width: 0.8 ms
Lead Channel Pacing Threshold Pulse Width: 0.8 ms
Lead Channel Sensing Intrinsic Amplitude: 0.8 mV
Lead Channel Sensing Intrinsic Amplitude: 11.7 mV
Lead Channel Setting Pacing Amplitude: 2 V
Lead Channel Setting Pacing Amplitude: 2.5 V
Lead Channel Setting Pacing Amplitude: 2.5 V
Lead Channel Setting Pacing Pulse Width: 0.8 ms
Lead Channel Setting Pacing Pulse Width: 1 ms
Lead Channel Setting Sensing Sensitivity: 0.5 mV
Pulse Gen Serial Number: 9905796

## 2022-10-03 ENCOUNTER — Other Ambulatory Visit (HOSPITAL_COMMUNITY): Payer: Self-pay | Admitting: Cardiology

## 2022-10-03 MED ORDER — CARVEDILOL 3.125 MG PO TABS: 3.1250 mg | ORAL_TABLET | Freq: Two times a day (BID) | ORAL | 11 refills | Status: DC

## 2022-10-10 DIAGNOSIS — I5023 Acute on chronic systolic (congestive) heart failure: Secondary | ICD-10-CM | POA: Diagnosis not present

## 2022-10-10 DIAGNOSIS — I4819 Other persistent atrial fibrillation: Secondary | ICD-10-CM | POA: Diagnosis not present

## 2022-10-10 DIAGNOSIS — N184 Chronic kidney disease, stage 4 (severe): Secondary | ICD-10-CM | POA: Diagnosis not present

## 2022-10-10 DIAGNOSIS — I2510 Atherosclerotic heart disease of native coronary artery without angina pectoris: Secondary | ICD-10-CM | POA: Diagnosis not present

## 2022-10-10 DIAGNOSIS — I130 Hypertensive heart and chronic kidney disease with heart failure and stage 1 through stage 4 chronic kidney disease, or unspecified chronic kidney disease: Secondary | ICD-10-CM | POA: Diagnosis not present

## 2022-10-10 DIAGNOSIS — J449 Chronic obstructive pulmonary disease, unspecified: Secondary | ICD-10-CM | POA: Diagnosis not present

## 2022-10-17 DIAGNOSIS — R7989 Other specified abnormal findings of blood chemistry: Secondary | ICD-10-CM | POA: Diagnosis not present

## 2022-10-17 DIAGNOSIS — R739 Hyperglycemia, unspecified: Secondary | ICD-10-CM | POA: Diagnosis not present

## 2022-10-17 DIAGNOSIS — E039 Hypothyroidism, unspecified: Secondary | ICD-10-CM | POA: Diagnosis not present

## 2022-10-17 DIAGNOSIS — E785 Hyperlipidemia, unspecified: Secondary | ICD-10-CM | POA: Diagnosis not present

## 2022-10-17 DIAGNOSIS — I2510 Atherosclerotic heart disease of native coronary artery without angina pectoris: Secondary | ICD-10-CM | POA: Diagnosis not present

## 2022-10-17 DIAGNOSIS — Z125 Encounter for screening for malignant neoplasm of prostate: Secondary | ICD-10-CM | POA: Diagnosis not present

## 2022-10-24 DIAGNOSIS — J449 Chronic obstructive pulmonary disease, unspecified: Secondary | ICD-10-CM | POA: Diagnosis not present

## 2022-10-24 DIAGNOSIS — I5022 Chronic systolic (congestive) heart failure: Secondary | ICD-10-CM | POA: Diagnosis not present

## 2022-10-24 DIAGNOSIS — Z0000 Encounter for general adult medical examination without abnormal findings: Secondary | ICD-10-CM | POA: Diagnosis not present

## 2022-10-24 DIAGNOSIS — R82998 Other abnormal findings in urine: Secondary | ICD-10-CM | POA: Diagnosis not present

## 2022-10-24 DIAGNOSIS — I130 Hypertensive heart and chronic kidney disease with heart failure and stage 1 through stage 4 chronic kidney disease, or unspecified chronic kidney disease: Secondary | ICD-10-CM | POA: Diagnosis not present

## 2022-10-24 DIAGNOSIS — Z23 Encounter for immunization: Secondary | ICD-10-CM | POA: Diagnosis not present

## 2022-10-24 DIAGNOSIS — I700 Atherosclerosis of aorta: Secondary | ICD-10-CM | POA: Diagnosis not present

## 2022-10-24 DIAGNOSIS — G4700 Insomnia, unspecified: Secondary | ICD-10-CM | POA: Diagnosis not present

## 2022-10-24 DIAGNOSIS — E785 Hyperlipidemia, unspecified: Secondary | ICD-10-CM | POA: Diagnosis not present

## 2022-10-24 DIAGNOSIS — Z1331 Encounter for screening for depression: Secondary | ICD-10-CM | POA: Diagnosis not present

## 2022-10-24 DIAGNOSIS — D692 Other nonthrombocytopenic purpura: Secondary | ICD-10-CM | POA: Diagnosis not present

## 2022-10-24 DIAGNOSIS — E039 Hypothyroidism, unspecified: Secondary | ICD-10-CM | POA: Diagnosis not present

## 2022-10-24 DIAGNOSIS — I2510 Atherosclerotic heart disease of native coronary artery without angina pectoris: Secondary | ICD-10-CM | POA: Diagnosis not present

## 2022-10-24 DIAGNOSIS — N184 Chronic kidney disease, stage 4 (severe): Secondary | ICD-10-CM | POA: Diagnosis not present

## 2022-10-24 DIAGNOSIS — M546 Pain in thoracic spine: Secondary | ICD-10-CM | POA: Diagnosis not present

## 2022-11-04 NOTE — Progress Notes (Signed)
Remote ICD transmission.   

## 2022-11-13 DIAGNOSIS — Z85828 Personal history of other malignant neoplasm of skin: Secondary | ICD-10-CM | POA: Diagnosis not present

## 2022-11-13 DIAGNOSIS — H61002 Unspecified perichondritis of left external ear: Secondary | ICD-10-CM | POA: Diagnosis not present

## 2022-11-21 ENCOUNTER — Encounter (HOSPITAL_COMMUNITY): Payer: Self-pay | Admitting: *Deleted

## 2022-11-22 NOTE — Progress Notes (Signed)
Patient ID: [name], male   DOB: [birth year], 89 y.o.   MRN: [id number]    Advanced Heart Failure Clinic Note   PCP: Dr. Virgina [name] HF Cardiologist: Dr. Haroldine [name]  HPI: [name] is a 89 y.o. male with a history of coronary artery disease, status post previous large anterior wall myocardial infarction s/p multivessel stenting, systolic CHF with EF 123456 s/p St. Jude BiV ICD.  Remainder of his medical history is notable for atrial fibrillation, hx GI bleed, chronic renal insufficiency with baseline creatinine about 2.2-2.5, hypertension, hyperlipidemia, and a systemic tremor. Bladder cancer 05/2012. Completed BCG treatment.   2015 found that his left ventricular lead has not been capturing suggestive of either fracture of the proximal electrode or an issue at the header. They reprogrammed the device to use to the RV coil which he tolerated well and had a good threshold.   He was admitted in 11/17 for respiratory virus.  Admitted 12/30 - 10/15/16 with A/C CHF in setting of URI. Thought to have mild CHF, so given IV diuresis in house with rapid improvement.   Admitted 2/19 with enterococcus bacteremia thought to be UTI. Treated with daptomycin. TEE EF 20-25% no vegetation mod MR  Has had a rough 6 months. Was in/out of hospital 3x. Weak, unable to walk. Had severe back spasms. Flu A and COVID. Imaging ok. Carvedilol cut back.   Has been back in AF since 4/23. Saw Dr. Caryl [name] and [name] in [facility] and decided on rate control strategy.   Echo 7/23 showed EF 20-25%, LV global HK, RV mildly reduced, moderate MR and TR  Admitted 10/23 with a/c CHF in setting of indiscretion with sodium intake. He was diuresed with IV lasix. Initially coreg and farxiga held, but added back on discharge. ReDS at discharge 34%.  Follow up 11/23, chronic NYHA III and volume stable.   Today he returns for HF follow up with his wife. Overall feeling fine. He walks up 13 steps at home without dyspnea. Has  some gait instability, but no recent falls. Has had recent GI upset. Denies palpitations, CP, abnormal bleeding, edema, or PND/Orthopnea. Appetite ok. No fever or chills. Weight at home 157 pounds. Taking all medications.   Review of systems complete and found to be negative unless listed in HPI.    Past Medical History:  Diagnosis Date   Acute bronchitis with COPD ([place]) 09/10/2016   Anemia    Atrial fibrillation or flutter    maintaining sinus on amiodarone     Bladder cancer ([place]) dx'd 05/2012   BPH (benign prostatic hyperplasia)    CAD (coronary artery disease)     a. s/p anterior MI 12/05 c/b shock -> stent LAD   b. s/p stenting OM-1, 2/06   CHF (congestive heart failure) ([place])    due to ischemic CM  a. EF 20-30%. (Nov 2008)   b. s/p St. Jude BiV-ICD    c. CPX 07/2008  pvo2 16.3 (63% predicted) slope 34 RER 1.08 O2 pulse 93%   Cough    occasional /productive, no fever/ not new   CRI (chronic renal insufficiency)    (baseline 2.0-2.2)/ recent hospitalization 8/13   GERD (gastroesophageal reflux disease)    hx Barretts esophagitis   History of blood transfusion    following coumadin  usage   HTN (hypertension)    EKG 05/14/12,Chest x ray 8/13, last ICD interrogation 8/13 EPIC   Hyperlipidemia    Hypothyroidism    Left ventricular lead failure  to capture on the ring electrode 12/16/2013   Major depression, single episode, in complete remission ([place]) 08/16/2016   Neuromuscular disorder ([place])    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"   no neurologist   Osteoarthritis 03/08/2011   Skin cancer    basal cells   facial x 4, 1 right forearm   Sleep apnea    STOP BANG SCORE 4   Current Outpatient Medications  Medication Sig Dispense Refill   acetaminophen (TYLENOL) 500 MG tablet Take 500-1,000 mg by mouth every 6 (six) hours as needed (for pain).     apixaban (ELIQUIS) 2.5 MG TABS tablet Take 1 tablet (2.5 mg total) by mouth 2 (two) times daily. 60 tablet 11   carboxymethylcellulose (REFRESH  PLUS) 0.5 % SOLN Place 1 drop into both eyes 3 (three) times daily as needed (dry/irritated eyes.).     carvedilol (COREG) 3.125 MG tablet Take 1 tablet (3.125 mg total) by mouth 2 (two) times daily with a meal. 60 tablet 11   cephALEXin (KEFLEX) 500 MG capsule Take 2,000 mg by mouth See admin instructions. Take 2,000 mg by mouth 45 minutes before dental procedures     ezetimibe (ZETIA) 10 MG tablet Take 10 mg by mouth daily with supper.     FARXIGA 10 MG TABS tablet TAKE 1 TABLET BY MOUTH EVERY DAY BEFORE BREAKFAST 90 tablet 3   ferrous sulfate 325 (65 FE) MG tablet Take 325 mg by mouth daily with supper.     furosemide (LASIX) 20 MG tablet Take 1 tablet (20 mg total) by mouth 2 (two) times daily. Take an extra dose of 84m for weight gain-3lbs in 1 day or 5lbs in 1 week 60 tablet 1   KLOR-CON M10 10 MEQ tablet Take 10 mEq by mouth in the morning.     Multiple Vitamin (MULTIVITAMIN WITH MINERALS) TABS tablet Take 1 tablet by mouth daily with supper. Centrum Silver     nitroGLYCERIN (NITROSTAT) 0.4 MG SL tablet DISSOLVE ONE TABLET UNDER THE TONGUE EVERY 5 MINUTES AS NEEDED FOR CHEST PAIN.  DO NOT EXCEED A TOTAL OF 3 DOSES IN 15 MINUTES (Patient taking differently: Place 0.4 mg under the tongue every 5 (five) minutes as needed for chest pain.) 25 tablet 0   pantoprazole (PROTONIX) 40 MG tablet Take 40 mg by mouth every evening.     rosuvastatin (CRESTOR) 20 MG tablet Take 20 mg by mouth every evening.     sodium chloride (OCEAN) 0.65 % SOLN nasal spray Place 1 spray into both nostrils as needed for congestion.     SYNTHROID 75 MCG tablet TAKE 1 TABLET BY MOUTH DAILY BEFORE BREAKFAST. 90 tablet 1   tamsulosin (FLOMAX) 0.4 MG CAPS capsule Take 0.4 mg by mouth at bedtime.      triamcinolone (NASACORT) 55 MCG/ACT AERO nasal inhaler Place 2 sprays into the nose daily as needed.     No current facility-administered medications for this encounter.   Wt Readings from Last 3 Encounters:  11/25/22 73.9 kg  (163 lb)  08/26/22 72.2 kg (159 lb 3.2 oz)  08/13/22 68.9 kg (151 lb 14.4 oz)    BP 90/60   Pulse 94   Wt 73.9 kg (163 lb)   SpO2 96%   BMI 24.07 kg/m   Physical Exam General:  NAD. No resp difficulty, walked into clinic, elderly HEENT: Normal Neck: Supple. No JVD. Carotids 2+ bilat; no bruits. No lymphadenopathy or thryomegaly appreciated. Cor: PMI nondisplaced. Irregular rate & rhythm. No rubs, gallops  or murmurs. Lungs: Clear Abdomen: Soft, nontender, nondistended. No hepatosplenomegaly. No bruits or masses. Good bowel sounds. Extremities: No cyanosis, clubbing, rash, edema Neuro: Alert & oriented x 3, cranial nerves grossly intact. Moves all 4 extremities w/o difficulty. Affect pleasant.  Device interrogation (personally reviewed from 10/16/22): CorVue stable, 46 % BiV pacing  ASSESSMENT & PLAN: 1. Chronic systolic CHF - S/p BIV ICD - Echo 10/13/2017 LVEF 20-25%  - Echo 2/19 EF 20-25% - Echo 07/23: EF 20-25%, RV mildly reduced, moderate MR - Stable chronic NYHA III. Volume status ok. - Continue Lasix 20 mg bid. - Continue Coreg 3.125 mg bid. (cut back due to low BP) - Continue Farxiga 10 mg daily. - No ARB.MRA with CKD IV - K 4.0, SCr 2.1 on labs at PCP (1/24).  2. CAD - No s/s of ischemia  - Off ASA due to Eliquis.  - Continue statin + Zetia. LDL goal < 70 - Lipids followed by Dr. Virgina [name]. LDL 64 (1/24).  3. PAF -> now chronic AF - Remains in rate controlled AF - Saw Dr. Caryl [name] and [name] in [facility] and decided on rate control strategy.  - Continue Eliquis 2.5 mg bid. No bleeding issues. - Hgb 14.7 on labs from (1/24)  4. CKD, Stage IV  - Creatinine baseline 2.2-2.3  - Follows with Dr. Joelyn Oms - Continue [place] - Recent labs reviewed and are stable.  Follow up in 6 months with Dr. Haroldine [name].  [name] Bihari, FNP  2:42 PM

## 2022-11-25 ENCOUNTER — Encounter (HOSPITAL_COMMUNITY): Payer: Self-pay

## 2022-11-25 ENCOUNTER — Ambulatory Visit (HOSPITAL_COMMUNITY)
Admission: RE | Admit: 2022-11-25 | Discharge: 2022-11-25 | Disposition: A | Discharge status: Home / Self Care | Payer: Medicare Other | Source: Ambulatory Visit | Attending: Family Medicine | Admitting: Family Medicine

## 2022-11-25 VITALS — BP 90/60 | HR 94 | Wt 163.0 lb

## 2022-11-25 DIAGNOSIS — I480 Paroxysmal atrial fibrillation: Secondary | ICD-10-CM | POA: Diagnosis not present

## 2022-11-25 DIAGNOSIS — I482 Chronic atrial fibrillation, unspecified: Secondary | ICD-10-CM | POA: Diagnosis not present

## 2022-11-25 DIAGNOSIS — I2510 Atherosclerotic heart disease of native coronary artery without angina pectoris: Secondary | ICD-10-CM | POA: Diagnosis not present

## 2022-11-25 DIAGNOSIS — G4730 Sleep apnea, unspecified: Secondary | ICD-10-CM | POA: Insufficient documentation

## 2022-11-25 DIAGNOSIS — E039 Hypothyroidism, unspecified: Secondary | ICD-10-CM | POA: Diagnosis not present

## 2022-11-25 DIAGNOSIS — K219 Gastro-esophageal reflux disease without esophagitis: Secondary | ICD-10-CM | POA: Insufficient documentation

## 2022-11-25 DIAGNOSIS — N184 Chronic kidney disease, stage 4 (severe): Secondary | ICD-10-CM | POA: Diagnosis not present

## 2022-11-25 DIAGNOSIS — Z79899 Other long term (current) drug therapy: Secondary | ICD-10-CM | POA: Insufficient documentation

## 2022-11-25 DIAGNOSIS — Z85828 Personal history of other malignant neoplasm of skin: Secondary | ICD-10-CM | POA: Insufficient documentation

## 2022-11-25 DIAGNOSIS — Z8551 Personal history of malignant neoplasm of bladder: Secondary | ICD-10-CM | POA: Insufficient documentation

## 2022-11-25 DIAGNOSIS — Z8719 Personal history of other diseases of the digestive system: Secondary | ICD-10-CM | POA: Diagnosis not present

## 2022-11-25 DIAGNOSIS — I252 Old myocardial infarction: Secondary | ICD-10-CM | POA: Insufficient documentation

## 2022-11-25 DIAGNOSIS — I5022 Chronic systolic (congestive) heart failure: Secondary | ICD-10-CM | POA: Insufficient documentation

## 2022-11-25 DIAGNOSIS — J449 Chronic obstructive pulmonary disease, unspecified: Secondary | ICD-10-CM | POA: Diagnosis not present

## 2022-11-25 DIAGNOSIS — Z955 Presence of coronary angioplasty implant and graft: Secondary | ICD-10-CM | POA: Diagnosis not present

## 2022-11-25 DIAGNOSIS — M1990 Unspecified osteoarthritis, unspecified site: Secondary | ICD-10-CM | POA: Diagnosis not present

## 2022-11-25 DIAGNOSIS — E785 Hyperlipidemia, unspecified: Secondary | ICD-10-CM | POA: Insufficient documentation

## 2022-11-25 DIAGNOSIS — Z7901 Long term (current) use of anticoagulants: Secondary | ICD-10-CM | POA: Diagnosis not present

## 2022-11-25 DIAGNOSIS — Z7984 Long term (current) use of oral hypoglycemic drugs: Secondary | ICD-10-CM | POA: Insufficient documentation

## 2022-11-25 DIAGNOSIS — Z79890 Hormone replacement therapy (postmenopausal): Secondary | ICD-10-CM | POA: Insufficient documentation

## 2022-11-25 DIAGNOSIS — I130 Hypertensive heart and chronic kidney disease with heart failure and stage 1 through stage 4 chronic kidney disease, or unspecified chronic kidney disease: Secondary | ICD-10-CM | POA: Diagnosis not present

## 2022-11-25 NOTE — Patient Instructions (Signed)
  At the [facility], you and your health needs are our priority. We have a designated team specialized in the treatment of Heart Failure. This Care Team includes your primary Heart Failure Specialized Cardiologist (physician), Advanced Practice Providers (APPs- Physician Assistants and Nurse Practitioners), and Pharmacist who all work together to provide you with the care you need, when you need it.   You may see any of the following providers on your designated Care Team at your next follow up:  Dr. Glori Bickers Dr. Loralie Champagne Dr. Roxana Hires, NP Lyda Jester, [place] [facility], [place] Forestine Na, NP Audry Riles, PharmD   Please be sure to bring in all your medications bottles to every appointment.   Need to Contact us:  If you have any questions or concerns before your next appointment please send [place] a message through [place] or call our office at [phone].    TO LEAVE A MESSAGE FOR THE NURSE SELECT OPTION 2, PLEASE LEAVE A MESSAGE INCLUDING: YOUR NAME DATE OF BIRTH CALL BACK NUMBER REASON FOR CALL**this is important as we prioritize the call backs  YOU WILL RECEIVE A CALL BACK THE SAME DAY AS LONG AS YOU CALL BEFORE 4:00 PM

## 2022-12-02 ENCOUNTER — Ambulatory Visit (INDEPENDENT_AMBULATORY_CARE_PROVIDER_SITE_OTHER): Payer: Medicare Other | Admitting: Podiatry

## 2022-12-02 ENCOUNTER — Encounter: Payer: Self-pay | Admitting: Podiatry

## 2022-12-02 VITALS — BP 108/61

## 2022-12-02 DIAGNOSIS — M79675 Pain in left toe(s): Secondary | ICD-10-CM | POA: Diagnosis not present

## 2022-12-02 DIAGNOSIS — B351 Tinea unguium: Secondary | ICD-10-CM | POA: Diagnosis not present

## 2022-12-02 DIAGNOSIS — M79674 Pain in right toe(s): Secondary | ICD-10-CM | POA: Diagnosis not present

## 2022-12-02 DIAGNOSIS — G629 Polyneuropathy, unspecified: Secondary | ICD-10-CM

## 2022-12-02 NOTE — Progress Notes (Signed)
  Subjective:  Patient ID: [name], male    DOB: 06-25-1932,  MRN: [id number]  [name] presents to clinic today for at risk foot care with history of peripheral neuropathy and painful thick toenails that are difficult to trim. Pain interferes with ambulation. Aggravating factors include wearing enclosed shoe gear. Pain is relieved with periodic professional debridement.  Chief Complaint  Patient presents with   Nail Problem    [facility] PCP-[facility] PCP V[facility]- 2 months ago   New problem(s): None.   He is accompanied by his wife on today's visit.  He was hospitalized for CHF at the end of October.  PCP is [name], MD.  Allergies  Allergen Reactions   Amoxicillin Hives and Rash   Gabapentin Itching   Penicillin G Sodium Itching and Rash   Penicillins Itching and Rash    Has patient had a PCN reaction causing immediate rash, facial/tongue/throat swelling, SOB or lightheadedness with hypotension: No Has patient had a PCN reaction causing severe rash involving mucus membranes or skin necrosis: Yes-back and hands. Has patient had a PCN reaction that required hospitalization: No Has patient had a PCN reaction occurring within the last 10 years: Yes If all of the above answers are "NO", then may proceed with Cephalosporin use.   Ramipril Cough    Review of Systems: Negative except as noted in the HPI.  Objective: No changes noted in today's physical examination. Vitals:   12/02/22 1029  BP: 108/61   [name] is a pleasant 89 y.o. male WD, WN in NAD. AAO x 3.  Neurovascular Examination: CFT <3 seconds b/l LE. Faintly palpable pedal pulses b/l LE. Pedal hair sparse b/l LE. Skin temperature gradient WNL b/l. No pain with calf compression b/l. No edema b/l LE. No cyanosis or clubbing noted b/l LE.  Pt has subjective symptoms of neuropathy. Protective sensation intact 5/5 intact bilaterally with 10g monofilament b/l. Vibratory sensation diminished  b/l.  Dermatological:  Pedal skin thin, shiny and atrophic b/l LE.  No open wounds b/l. No interdigital macerations b/l.   Toenails 1-5 b/l elongated, thickened, discolored with subungual debris. +Tenderness with dorsal palpation of nailplates.   Incurvated nailplate medial border left hallux and medial border right hallux with tenderness to palpation. No erythema, no edema, no drainage noted. No fluctuance.   Musculoskeletal:  Normal muscle strength 5/5 to all lower extremity muscle groups bilaterally. HAV with bunion deformity noted b/l LE.Marland Kitchen No pain, crepitus or joint limitation noted with ROM b/l LE.  Patient ambulates with cane assistance on today's visit.  Assessment/Plan: 1. Pain due to onychomycosis of toenails of both feet   2. Peripheral polyneuropathy     -Consent given for treatment as described below: -Examined patient. -Continue supportive shoe gear daily. -Mycotic toenails 1-5 bilaterally were debrided in length and girth with sterile nail nippers and dremel without incident. -Patient/POA to call should there be question/concern in the interim.   Return in about 3 months (around 03/02/2023).  Marzetta Board, DPM

## 2022-12-23 ENCOUNTER — Encounter: Payer: Self-pay | Admitting: Internal Medicine

## 2022-12-23 ENCOUNTER — Ambulatory Visit: Payer: Medicare Other | Attending: Internal Medicine | Admitting: Internal Medicine

## 2022-12-23 VITALS — BP 110/58 | HR 76 | Ht 69.0 in | Wt 159.0 lb

## 2022-12-23 DIAGNOSIS — I255 Ischemic cardiomyopathy: Secondary | ICD-10-CM | POA: Insufficient documentation

## 2022-12-23 DIAGNOSIS — I480 Paroxysmal atrial fibrillation: Secondary | ICD-10-CM | POA: Diagnosis not present

## 2022-12-23 DIAGNOSIS — Z95810 Presence of automatic (implantable) cardiac defibrillator: Secondary | ICD-10-CM

## 2022-12-23 DIAGNOSIS — I5022 Chronic systolic (congestive) heart failure: Secondary | ICD-10-CM | POA: Insufficient documentation

## 2022-12-23 NOTE — Progress Notes (Signed)
Symptoms since      Patient Care Team: [name], MD as PCP - General (Internal Medicine) Haroldine Laws, Shaune Pascal, MD as Consulting Physician (Cardiology)   HPI  [name] is a 89 y.o. male seen in followup for ischemic heart myopathy congestive heart failure and prior CRT-D with generator replaced 12/14; 12/21.     Recurrent  atrial fibrillation. Managed with amiodarone -- had problems with neurotoxicity with gait instability and his amiodarone  decreased>>and then stopped 2/2 tremor  Resumed 2020 for recurrent afib and had tolerated.  However, it was stopped at the heart failure clinic 5/22  no clinical afib ((See Below))     The patient denies chest pain, shortness of breath, nocturnal dyspnea, orthopnea or peripheral edema.  There have been no palpitations, lightheadedness or syncope.   Biggest issue is back pain currently quiescient tells me his son is a care facility because of the stroke  Date Cr K Hgb  4/17 2.5      6/19     2/20 2.49 3.6 13.1  11/20 2.2  12.6  6/21 2.49 4.5 12.8  12/21 2.52 4.1 13.7  12/22 2.27 3.6 12.5  11/23 2.19 3.8 12.9      DATE TEST EF   12/14 Echo   20-25 %   2/19 Echo  25%   7/23 Echo  20-25%      Thromboembolic risk factors ( age  -2, Vasc disease -1, CHF-1) for a CHADSVASc Score of >=4        Past Medical History:  Diagnosis Date   Acute bronchitis with COPD ([place]) 09/10/2016   Anemia    Atrial fibrillation or flutter    maintaining sinus on amiodarone     Bladder cancer ([place]) dx'd 05/2012   BPH (benign prostatic hyperplasia)    CAD (coronary artery disease)     a. s/p anterior MI 12/05 c/b shock -> stent LAD   b. s/p stenting OM-1, 2/06   CHF (congestive heart failure) ([place])    due to ischemic CM  a. EF 20-30%. (Nov 2008)   b. s/p St. Jude BiV-ICD    c. CPX 07/2008  pvo2 16.3 (63% predicted) slope 34 RER 1.08 O2 pulse 93%   Cough    occasional /productive, no fever/ not new   CRI (chronic renal insufficiency)     (baseline 2.0-2.2)/ recent hospitalization 8/13   GERD (gastroesophageal reflux disease)    hx Barretts esophagitis   History of blood transfusion    following coumadin  usage   HTN (hypertension)    EKG 05/14/12,Chest x ray 8/13, last ICD interrogation 8/13 EPIC   Hyperlipidemia    Hypothyroidism    Left ventricular lead failure to capture on the ring electrode 12/16/2013   Major depression, single episode, in complete remission ([place]) 08/16/2016   Neuromuscular disorder ([place])    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"   no neurologist   Osteoarthritis 03/08/2011   Skin cancer    basal cells   facial x 4, 1 right forearm   Sleep apnea    STOP BANG SCORE 4    Past Surgical History:  Procedure Laterality Date   BACK SURGERY  1972   lower back   BIV ICD GENERATOR CHANGEOUT N/A 10/02/2020   Procedure: BIV ICD GENERATOR CHANGEOUT;  Surgeon: Deboraha Sprang, MD;  Location: [facility];  Service: Cardiovascular;  Laterality: N/A;   CARDIAC DEFIBRILLATOR PLACEMENT     ICD St Jude; gen change 09-20-13  CERVICAL LAMINECTOMY  1985   COLONOSCOPY     CORONARY ANGIOPLASTY  2005,2006   CYSTOSCOPY W/ RETROGRADES  05/25/2012   Procedure: CYSTOSCOPY WITH RETROGRADE PYELOGRAM;  Surgeon: Molli Hazard, MD;  Location: [facility];  Service: Urology;  Laterality: Bilateral;   CYSTOSCOPY W/ URETERAL STENT PLACEMENT  07/08/2012   Procedure: CYSTOSCOPY WITH STENT REPLACEMENT;  Surgeon: Molli Hazard, MD;  Location: [facility];  Service: Urology;  Laterality: Left;   CYSTOSCOPY W/ URETERAL STENT REMOVAL  07/08/2012   Procedure: CYSTOSCOPY WITH STENT REMOVAL;  Surgeon: Molli Hazard, MD;  Location: [facility];  Service: Urology;  Laterality: Bilateral;   CYSTOSCOPY/RETROGRADE/URETEROSCOPY  07/08/2012   Procedure: CYSTOSCOPY/RETROGRADE/URETEROSCOPY;  Surgeon: Molli Hazard, MD;  Location: [facility];  Service: Urology;  Laterality: Left;   ESOPHAGOGASTRODUODENOSCOPY     HERNIA REPAIR  1989    bilateral   IMPLANTABLE CARDIOVERTER DEFIBRILLATOR (ICD) GENERATOR CHANGE N/A 09/20/2013   Procedure: ICD GENERATOR CHANGE;  Surgeon: Deboraha Sprang, MD;  Location: [facility] CATH LAB;  Service: Cardiovascular;  Laterality: N/A;   TEE WITHOUT CARDIOVERSION N/A 12/10/2017   Procedure: TRANSESOPHAGEAL ECHOCARDIOGRAM (TEE);  Surgeon: Jerline Pain, MD;  Location: [facility] ENDOSCOPY;  Service: Cardiovascular;  Laterality: N/A;   TRANSURETHRAL RESECTION OF BLADDER TUMOR  05/25/2012   cold cup biopsy prostate   TRANSURETHRAL RESECTION OF BLADDER TUMOR  07/08/2012   Procedure: TRANSURETHRAL RESECTION OF BLADDER TUMOR (TURBT);  Surgeon: Molli Hazard, MD;  Location: [facility];  Service: Urology;  Laterality: N/A;  CYSTO, TURBT W/ GYRUS, BILATERAL STENT REMOVAL, LEFT URETEROSCOPY WITH BIOPSY, POSSIBLE LEFT STENT PLACEMENT     Current Outpatient Medications  Medication Sig Dispense Refill   acetaminophen (TYLENOL) 500 MG tablet Take 500-1,000 mg by mouth every 6 (six) hours as needed (for pain).     apixaban (ELIQUIS) 2.5 MG TABS tablet Take 1 tablet (2.5 mg total) by mouth 2 (two) times daily. 60 tablet 11   carboxymethylcellulose (REFRESH PLUS) 0.5 % SOLN Place 1 drop into both eyes 3 (three) times daily as needed (dry/irritated eyes.).     carvedilol (COREG) 3.125 MG tablet Take 1 tablet (3.125 mg total) by mouth 2 (two) times daily with a meal. 60 tablet 11   cephALEXin (KEFLEX) 500 MG capsule Take 2,000 mg by mouth See admin instructions. Take 2,000 mg by mouth 45 minutes before dental procedures     ezetimibe (ZETIA) 10 MG tablet Take 10 mg by mouth daily with supper.     FARXIGA 10 MG TABS tablet TAKE 1 TABLET BY MOUTH EVERY DAY BEFORE BREAKFAST 90 tablet 3   ferrous sulfate 325 (65 FE) MG tablet Take 325 mg by mouth daily with supper.     furosemide (LASIX) 20 MG tablet Take 1 tablet (20 mg total) by mouth 2 (two) times daily. Take an extra dose of '20mg'$  for weight gain-3lbs in 1 day or 5lbs in 1 week 60  tablet 1   KLOR-CON M10 10 MEQ tablet Take 10 mEq by mouth in the morning.     Multiple Vitamin (MULTIVITAMIN WITH MINERALS) TABS tablet Take 1 tablet by mouth daily with supper. Centrum Silver     nitroGLYCERIN (NITROSTAT) 0.4 MG SL tablet DISSOLVE ONE TABLET UNDER THE TONGUE EVERY 5 MINUTES AS NEEDED FOR CHEST PAIN.  DO NOT EXCEED A TOTAL OF 3 DOSES IN 15 MINUTES (Patient taking differently: Place 0.4 mg under the tongue every 5 (five) minutes as needed for chest pain.) 25 tablet 0   pantoprazole (  PROTONIX) 40 MG tablet Take 40 mg by mouth every evening.     rosuvastatin (CRESTOR) 20 MG tablet Take 20 mg by mouth every evening.     sodium chloride (OCEAN) 0.65 % SOLN nasal spray Place 1 spray into both nostrils as needed for congestion.     SYNTHROID 75 MCG tablet TAKE 1 TABLET BY MOUTH DAILY BEFORE BREAKFAST. 90 tablet 1   tamsulosin (FLOMAX) 0.4 MG CAPS capsule Take 0.4 mg by mouth at bedtime.      triamcinolone (NASACORT) 55 MCG/ACT AERO nasal inhaler Place 2 sprays into the nose daily as needed.     No current facility-administered medications for this visit.    Allergies  Allergen Reactions   Amoxicillin Hives and Rash   Gabapentin Itching   Penicillin G Sodium Itching and Rash   Penicillins Itching and Rash    Has patient had a PCN reaction causing immediate rash, facial/tongue/throat swelling, SOB or lightheadedness with hypotension: No Has patient had a PCN reaction causing severe rash involving mucus membranes or skin necrosis: Yes-back and hands. Has patient had a PCN reaction that required hospitalization: No Has patient had a PCN reaction occurring within the last 10 years: Yes If all of the above answers are "NO", then may proceed with Cephalosporin use.   Ramipril Cough    Review of Systems negative except from HPI and PMH  Physical Exam BP (!) 110/58   Pulse 76   Ht '5\' 9"'$  (1.753 m)   Wt 159 lb (72.1 kg)   SpO2 94%   BMI 23.48 kg/m  Well developed and well  nourished in no acute distress HENT normal Neck supple with JVP-flat Clear Device pocket well healed; without hematoma or erythema.  There is no tethering  Regular rate and rhythm, no  murmur Abd-soft with active BS No Clubbing cyanosis NO edema Skin-warm and dry A & Oriented  Grossly normal sensory and motor function  ECG ATRIAL fib with intermittnet V pacing; upright QRS V1  Device function is normal. No Programming changes   See Paceart for details    Assessment and  Plan  Ischemic cardiomyopathy  CRT-d  St Jude status post generator replacement 12/21  Atrial fibrillation permanent  Heart failure-chronic systolic  Renal insufficiency grade 3-4    Euvolelemic; continue furosemide. Cardiomyopathy medications are limited by hypotension.  Given his paucity of symptoms we will not try to make any adjustments at this point for his rapid atrial fibrillation only 1.2% of his heartbeats are faster than 100 although his ventricular pacing percentage is very low with CRT  continue carvedilol  Three options if his symptoms worsen 2/2 rate--digoxin although difficult with his renal function, metoprolol as opposed to carvedilol which might give [place] more beta-blocker for the blood pressure as it does not have concomitant alpha blockade or AV junction ablation.

## 2022-12-23 NOTE — Patient Instructions (Signed)
Medication Instructions:  Your physician recommends that you continue on your current medications as directed. Please refer to the Current Medication list given to you today.  *If you need a refill on your cardiac medications before your next appointment, please call your pharmacy*   Lab Work: None ordered.  If you have labs (blood work) drawn today and your tests are completely normal, you will receive your results only by: MyChart Message (if you have MyChart) OR A paper copy in the mail If you have any lab test that is abnormal or we need to change your treatment, we will call you to review the results.   Testing/Procedures: None ordered.    Follow-Up: At [facility], you and your health needs are our priority.  As part of our continuing mission to provide you with exceptional heart care, we have created designated Provider Care Teams.  These Care Teams include your primary Cardiologist (physician) and Advanced Practice Providers (APPs -  Physician Assistants and Nurse Practitioners) who all work together to provide you with the care you need, when you need it.  We recommend signing up for the patient portal called "MyChart".  Sign up information is provided on this After Visit Summary.  MyChart is used to connect with patients for Virtual Visits (Telemedicine).  Patients are able to view lab/test results, encounter notes, upcoming appointments, etc.  Non-urgent messages can be sent to your provider as well.   To learn more about what you can do with MyChart, go to [email or web address].    Your next appointment:   12 months with Dr Klein 

## 2022-12-24 LAB — CUP PACEART INCLINIC DEVICE CHECK
Battery Remaining Longevity: 49 mo
Brady Statistic RA Percent Paced: 8.2 %
Brady Statistic RV Percent Paced: 47 %
Date Time Interrogation Session: 20240311125600
HighPow Impedance: 50.0253
Implantable Lead Connection Status: 753985
Implantable Lead Connection Status: 753985
Implantable Lead Connection Status: 753985
Implantable Lead Implant Date: 20060509
Implantable Lead Implant Date: 20060509
Implantable Lead Implant Date: 20090417
Implantable Lead Location: 753858
Implantable Lead Location: 753859
Implantable Lead Location: 753860
Implantable Lead Model: 5076
Implantable Lead Model: 7001
Implantable Pulse Generator Implant Date: 20211220
Lead Channel Impedance Value: 400 Ohm
Lead Channel Impedance Value: 400 Ohm
Lead Channel Impedance Value: 562.5 Ohm
Lead Channel Pacing Threshold Amplitude: 1 V
Lead Channel Pacing Threshold Amplitude: 1 V
Lead Channel Pacing Threshold Amplitude: 1.25 V
Lead Channel Pacing Threshold Amplitude: 1.25 V
Lead Channel Pacing Threshold Pulse Width: 0.8 ms
Lead Channel Pacing Threshold Pulse Width: 0.8 ms
Lead Channel Pacing Threshold Pulse Width: 1 ms
Lead Channel Pacing Threshold Pulse Width: 1 ms
Lead Channel Sensing Intrinsic Amplitude: 0.5 mV
Lead Channel Sensing Intrinsic Amplitude: 11.7 mV
Lead Channel Setting Pacing Amplitude: 2 V
Lead Channel Setting Pacing Amplitude: 2.5 V
Lead Channel Setting Pacing Amplitude: 2.5 V
Lead Channel Setting Pacing Pulse Width: 0.8 ms
Lead Channel Setting Pacing Pulse Width: 1 ms
Lead Channel Setting Sensing Sensitivity: 0.5 mV
Pulse Gen Serial Number: 9905796

## 2022-12-30 ENCOUNTER — Ambulatory Visit (INDEPENDENT_AMBULATORY_CARE_PROVIDER_SITE_OTHER): Payer: Medicare Other

## 2022-12-30 DIAGNOSIS — I255 Ischemic cardiomyopathy: Secondary | ICD-10-CM | POA: Diagnosis not present

## 2022-12-31 LAB — CUP PACEART REMOTE DEVICE CHECK
Battery Remaining Longevity: 46 mo
Battery Remaining Percentage: 61 %
Battery Voltage: 2.95 V
Brady Statistic AP VP Percent: 18 %
Brady Statistic AP VS Percent: 4.4 %
Brady Statistic AS VP Percent: 72 %
Brady Statistic AS VS Percent: 2.4 %
Brady Statistic RA Percent Paced: 9 %
Date Time Interrogation Session: 20240318020017
HighPow Impedance: 43 Ohm
HighPow Impedance: 43 Ohm
Implantable Lead Connection Status: 753985
Implantable Lead Connection Status: 753985
Implantable Lead Connection Status: 753985
Implantable Lead Implant Date: 20060509
Implantable Lead Implant Date: 20060509
Implantable Lead Implant Date: 20090417
Implantable Lead Location: 753858
Implantable Lead Location: 753859
Implantable Lead Location: 753860
Implantable Lead Model: 5076
Implantable Lead Model: 7001
Implantable Pulse Generator Implant Date: 20211220
Lead Channel Impedance Value: 350 Ohm
Lead Channel Impedance Value: 390 Ohm
Lead Channel Impedance Value: 480 Ohm
Lead Channel Pacing Threshold Amplitude: 0.75 V
Lead Channel Pacing Threshold Amplitude: 1 V
Lead Channel Pacing Threshold Amplitude: 1.625 V
Lead Channel Pacing Threshold Pulse Width: 0.5 ms
Lead Channel Pacing Threshold Pulse Width: 0.8 ms
Lead Channel Pacing Threshold Pulse Width: 1 ms
Lead Channel Sensing Intrinsic Amplitude: 0.5 mV
Lead Channel Sensing Intrinsic Amplitude: 11.7 mV
Lead Channel Setting Pacing Amplitude: 2 V
Lead Channel Setting Pacing Amplitude: 2.5 V
Lead Channel Setting Pacing Amplitude: 2.625
Lead Channel Setting Pacing Pulse Width: 0.8 ms
Lead Channel Setting Pacing Pulse Width: 1 ms
Lead Channel Setting Sensing Sensitivity: 0.5 mV
Pulse Gen Serial Number: 9905796

## 2023-01-13 ENCOUNTER — Other Ambulatory Visit: Payer: Self-pay | Admitting: Internal Medicine

## 2023-02-07 NOTE — Progress Notes (Signed)
Remote ICD transmission.   

## 2023-02-10 ENCOUNTER — Other Ambulatory Visit: Payer: Self-pay | Admitting: Internal Medicine

## 2023-02-11 DIAGNOSIS — N184 Chronic kidney disease, stage 4 (severe): Secondary | ICD-10-CM | POA: Diagnosis not present

## 2023-02-11 DIAGNOSIS — I129 Hypertensive chronic kidney disease with stage 1 through stage 4 chronic kidney disease, or unspecified chronic kidney disease: Secondary | ICD-10-CM | POA: Diagnosis not present

## 2023-02-11 DIAGNOSIS — I509 Heart failure, unspecified: Secondary | ICD-10-CM | POA: Diagnosis not present

## 2023-02-11 DIAGNOSIS — D472 Monoclonal gammopathy: Secondary | ICD-10-CM | POA: Diagnosis not present

## 2023-02-11 LAB — LAB REPORT - SCANNED: EGFR: 28

## 2023-02-25 ENCOUNTER — Ambulatory Visit (INDEPENDENT_AMBULATORY_CARE_PROVIDER_SITE_OTHER): Payer: Medicare Other | Admitting: Podiatry

## 2023-02-25 ENCOUNTER — Encounter: Payer: Self-pay | Admitting: Podiatry

## 2023-02-25 DIAGNOSIS — G629 Polyneuropathy, unspecified: Secondary | ICD-10-CM | POA: Diagnosis not present

## 2023-02-25 DIAGNOSIS — M79675 Pain in left toe(s): Secondary | ICD-10-CM | POA: Diagnosis not present

## 2023-02-25 DIAGNOSIS — B351 Tinea unguium: Secondary | ICD-10-CM

## 2023-02-25 DIAGNOSIS — M79674 Pain in right toe(s): Secondary | ICD-10-CM

## 2023-03-01 NOTE — Progress Notes (Signed)
  Subjective:  Patient ID: [name], male    DOB: 1931/11/18,  MRN: [id number]  [name] presents to clinic today for at risk foot care with history of diabetic neuropathy and painful thick toenails that are difficult to trim. Pain interferes with ambulation. Aggravating factors include wearing enclosed shoe gear. Pain is relieved with periodic professional debridement.  Chief Complaint  Patient presents with   Nail Problem    RFC PCP-[facility] PCP VST- 4 or 5 months ago   New problem(s): None.   PCP is Creola Corn, MD.  Allergies  Allergen Reactions   Amoxicillin Hives and Rash   Gabapentin Itching   Penicillin G Sodium Itching and Rash   Penicillins Itching and Rash    Has patient had a PCN reaction causing immediate rash, facial/tongue/throat swelling, SOB or lightheadedness with hypotension: No Has patient had a PCN reaction causing severe rash involving mucus membranes or skin necrosis: Yes-back and hands. Has patient had a PCN reaction that required hospitalization: No Has patient had a PCN reaction occurring within the last 10 years: Yes If all of the above answers are "NO", then may proceed with Cephalosporin use.   Ramipril Cough    Review of Systems: Negative except as noted in the HPI.  Objective: No changes noted in today's physical examination. There were no vitals filed for this visit. [name] is a pleasant 89 y.o. male WD, WN in NAD. AAO x 3.  Neurovascular Examination: CFT <3 seconds b/l LE. Faintly palpable pedal pulses b/l LE. Pedal hair sparse b/l LE. Skin temperature gradient WNL b/l. No pain with calf compression b/l. No edema b/l LE. No cyanosis or clubbing noted b/l LE.  Pt has subjective symptoms of neuropathy. Protective sensation intact 5/5 intact bilaterally with 10g monofilament b/l. Vibratory sensation diminished b/l.  Dermatological:  Pedal skin thin, shiny and atrophic b/l LE.  No open wounds b/l. No interdigital  macerations b/l.   Toenails 1-5 b/l elongated, thickened, discolored with subungual debris. +Tenderness with dorsal palpation of nailplates.   Incurvated nailplate medial border left hallux and medial border right hallux with tenderness to palpation. No erythema, no edema, no drainage noted. No fluctuance.   Musculoskeletal:  Normal muscle strength 5/5 to all lower extremity muscle groups bilaterally. HAV with bunion deformity noted b/l LE. No pain, crepitus or joint limitation noted with ROM b/l LE.  Patient ambulates with cane assistance on today's visit.  Assessment/Plan: 1. Pain due to onychomycosis of toenails of both feet   2. Peripheral polyneuropathy   -Patient was evaluated and treated. All patient's and/or POA's questions/concerns answered on today's visit. -Patient to continue soft, supportive shoe gear daily. -Toenails 1-5 b/l were debrided in length and girth with sterile nail nippers and dremel without iatrogenic bleeding.  -Patient/POA to call should there be question/concern in the interim.   Return in about 3 months (around 05/28/2023).  Freddie Breech, DPM

## 2023-03-01 NOTE — Progress Notes (Incomplete)
  Subjective:  Patient ID: [name], male    DOB: 11-01-1931,  MRN: [id number]  [name] presents to clinic today for {jgcomplaint:23593}  Chief Complaint  Patient presents with  . Nail Problem    RFC PCP-[facility] PCP VST- 4 or 5 months ago   New problem(s): None. {jgcomplaint:23593}  PCP is Creola Corn, MD.  Allergies  Allergen Reactions  . Amoxicillin Hives and Rash  . Gabapentin Itching  . Penicillin G Sodium Itching and Rash  . Penicillins Itching and Rash    Has patient had a PCN reaction causing immediate rash, facial/tongue/throat swelling, SOB or lightheadedness with hypotension: No Has patient had a PCN reaction causing severe rash involving mucus membranes or skin necrosis: Yes-back and hands. Has patient had a PCN reaction that required hospitalization: No Has patient had a PCN reaction occurring within the last 10 years: Yes If all of the above answers are "NO", then may proceed with Cephalosporin use.  . Ramipril Cough    Review of Systems: Negative except as noted in the HPI.  Objective: No changes noted in today's physical examination. There were no vitals filed for this visit. [name] is a pleasant 89 y.o. male {jgbodyhabitus:24098} AAO x 3.   Assessment/Plan: No diagnosis found.  No orders of the defined types were placed in this encounter.   None {Jgplan:23602::"-Patient/POA to call should there be question/concern in the interim."}   Return in about 3 months (around 05/28/2023).  Freddie Breech, DPM

## 2023-03-03 ENCOUNTER — Other Ambulatory Visit (HOSPITAL_COMMUNITY): Payer: Self-pay | Admitting: *Deleted

## 2023-03-03 ENCOUNTER — Other Ambulatory Visit (HOSPITAL_COMMUNITY): Payer: Self-pay | Admitting: Internal Medicine

## 2023-03-31 ENCOUNTER — Ambulatory Visit (INDEPENDENT_AMBULATORY_CARE_PROVIDER_SITE_OTHER): Payer: Medicare Other

## 2023-03-31 DIAGNOSIS — I255 Ischemic cardiomyopathy: Secondary | ICD-10-CM

## 2023-03-31 DIAGNOSIS — I5022 Chronic systolic (congestive) heart failure: Secondary | ICD-10-CM

## 2023-03-31 LAB — CUP PACEART REMOTE DEVICE CHECK
Battery Remaining Longevity: 43 mo
Battery Remaining Percentage: 57 %
Battery Voltage: 2.95 V
Brady Statistic AP VP Percent: 31 %
Brady Statistic AP VS Percent: 3.4 %
Brady Statistic AS VP Percent: 58 %
Brady Statistic AS VS Percent: 2.5 %
Brady Statistic RA Percent Paced: 8.9 %
Date Time Interrogation Session: 20240617020016
HighPow Impedance: 49 Ohm
HighPow Impedance: 50 Ohm
Implantable Lead Connection Status: 753985
Implantable Lead Connection Status: 753985
Implantable Lead Connection Status: 753985
Implantable Lead Implant Date: 20060509
Implantable Lead Implant Date: 20060509
Implantable Lead Implant Date: 20090417
Implantable Lead Location: 753858
Implantable Lead Location: 753859
Implantable Lead Location: 753860
Implantable Lead Model: 5076
Implantable Lead Model: 7001
Implantable Pulse Generator Implant Date: 20211220
Lead Channel Impedance Value: 390 Ohm
Lead Channel Impedance Value: 390 Ohm
Lead Channel Impedance Value: 480 Ohm
Lead Channel Pacing Threshold Amplitude: 0.75 V
Lead Channel Pacing Threshold Amplitude: 1 V
Lead Channel Pacing Threshold Amplitude: 1.625 V
Lead Channel Pacing Threshold Pulse Width: 0.5 ms
Lead Channel Pacing Threshold Pulse Width: 0.8 ms
Lead Channel Pacing Threshold Pulse Width: 1 ms
Lead Channel Sensing Intrinsic Amplitude: 0.6 mV
Lead Channel Sensing Intrinsic Amplitude: 11.7 mV
Lead Channel Setting Pacing Amplitude: 2 V
Lead Channel Setting Pacing Amplitude: 2.5 V
Lead Channel Setting Pacing Amplitude: 2.625
Lead Channel Setting Pacing Pulse Width: 0.8 ms
Lead Channel Setting Pacing Pulse Width: 1 ms
Lead Channel Setting Sensing Sensitivity: 0.5 mV
Pulse Gen Serial Number: 9905796

## 2023-04-18 NOTE — Progress Notes (Signed)
Remote ICD transmission.   

## 2023-04-22 ENCOUNTER — Encounter (HOSPITAL_COMMUNITY): Payer: Self-pay | Admitting: Internal Medicine

## 2023-04-22 ENCOUNTER — Ambulatory Visit (HOSPITAL_COMMUNITY)
Admission: RE | Admit: 2023-04-22 | Discharge: 2023-04-22 | Disposition: A | Discharge status: Home / Self Care | Payer: Medicare Other | Source: Ambulatory Visit | Attending: Internal Medicine | Admitting: Internal Medicine

## 2023-04-22 VITALS — BP 92/50 | HR 82 | Wt 157.8 lb

## 2023-04-22 DIAGNOSIS — I482 Chronic atrial fibrillation, unspecified: Secondary | ICD-10-CM | POA: Insufficient documentation

## 2023-04-22 DIAGNOSIS — E785 Hyperlipidemia, unspecified: Secondary | ICD-10-CM | POA: Insufficient documentation

## 2023-04-22 DIAGNOSIS — Z8551 Personal history of malignant neoplasm of bladder: Secondary | ICD-10-CM | POA: Insufficient documentation

## 2023-04-22 DIAGNOSIS — N184 Chronic kidney disease, stage 4 (severe): Secondary | ICD-10-CM | POA: Insufficient documentation

## 2023-04-22 DIAGNOSIS — I480 Paroxysmal atrial fibrillation: Secondary | ICD-10-CM | POA: Diagnosis not present

## 2023-04-22 DIAGNOSIS — I252 Old myocardial infarction: Secondary | ICD-10-CM | POA: Diagnosis not present

## 2023-04-22 DIAGNOSIS — Z955 Presence of coronary angioplasty implant and graft: Secondary | ICD-10-CM | POA: Diagnosis not present

## 2023-04-22 DIAGNOSIS — Z95810 Presence of automatic (implantable) cardiac defibrillator: Secondary | ICD-10-CM | POA: Diagnosis not present

## 2023-04-22 DIAGNOSIS — I5022 Chronic systolic (congestive) heart failure: Secondary | ICD-10-CM | POA: Diagnosis not present

## 2023-04-22 DIAGNOSIS — Z7901 Long term (current) use of anticoagulants: Secondary | ICD-10-CM | POA: Diagnosis not present

## 2023-04-22 DIAGNOSIS — I2510 Atherosclerotic heart disease of native coronary artery without angina pectoris: Secondary | ICD-10-CM | POA: Insufficient documentation

## 2023-04-22 DIAGNOSIS — I130 Hypertensive heart and chronic kidney disease with heart failure and stage 1 through stage 4 chronic kidney disease, or unspecified chronic kidney disease: Secondary | ICD-10-CM | POA: Insufficient documentation

## 2023-04-22 DIAGNOSIS — Z7984 Long term (current) use of oral hypoglycemic drugs: Secondary | ICD-10-CM | POA: Insufficient documentation

## 2023-04-22 DIAGNOSIS — Z79899 Other long term (current) drug therapy: Secondary | ICD-10-CM | POA: Insufficient documentation

## 2023-04-22 NOTE — Patient Instructions (Signed)
There has been no changes to your medications.  Your physician recommends that you schedule a follow-up appointment in: 4 months ( November) ** please call the office in September to arrange your follow up appointment. **   If you have any questions or concerns before your next appointment please send [place] a message through [place] or call our office at [phone].    TO LEAVE A MESSAGE FOR THE NURSE SELECT OPTION 2, PLEASE LEAVE A MESSAGE INCLUDING: YOUR NAME DATE OF BIRTH CALL BACK NUMBER REASON FOR CALL**this is important as we prioritize the call backs  YOU WILL RECEIVE A CALL BACK THE SAME DAY AS LONG AS YOU CALL BEFORE 4:00 PM  At the [facility], you and your health needs are our priority. As part of our continuing mission to provide you with exceptional heart care, we have created designated Provider Care Teams. These Care Teams include your primary Cardiologist (physician) and Advanced Practice Providers (APPs- Physician Assistants and Nurse Practitioners) who all work together to provide you with the care you need, when you need it.   You may see any of the following providers on your designated Care Team at your next follow up: Dr Arvilla Meres Dr Marca Ancona Dr. Marcos Eke, NP Robbie Lis, [place] Jerold [facility] [place], [place] Brynda Peon, NP Karle Plumber, PharmD   Please be sure to bring in all your medications bottles to every appointment.    Thank you for choosing [facility]

## 2023-04-22 NOTE — Progress Notes (Addendum)
Patient ID: [name], male   DOB: 28-Oct-1931, 89 y.o.   MRN: [id number]    Advanced Heart Failure Clinic Note   PCP: Dr. Timothy Lasso HF Cardiologist: Dr. Gala Romney  HPI: [name] is a 89 y.o. male with a history of coronary artery disease, status post previous large anterior wall myocardial infarction s/p multivessel stenting, systolic CHF with EF 25% s/p St. Jude BiV ICD.  Remainder of his medical history is notable for atrial fibrillation, hx GI bleed, chronic renal insufficiency with baseline creatinine about 2.2-2.5, hypertension, hyperlipidemia, and a systemic tremor. Bladder cancer 05/2012. Completed BCG treatment.   2015 found that his left ventricular lead has not been capturing suggestive of either fracture of the proximal electrode or an issue at the header. They reprogrammed the device to use to the RV coil which he tolerated well and had a good threshold.   He was admitted in 11/17 for respiratory virus.  Admitted 12/30 - 10/15/16 with A/C CHF in setting of URI. Thought to have mild CHF, so given IV diuresis in house with rapid improvement.   Admitted 2/19 with enterococcus bacteremia thought to be UTI. Treated with daptomycin. TEE EF 20-25% no vegetation mod MR  Has had a rough 6 months. Was in/out of hospital 3x. Weak, unable to walk. Had severe back spasms. Flu A and COVID. Imaging ok. Carvedilol cut back.   Has been back in AF since 4/23. Saw Dr. Graciela Husbands and Rudi Coco in AF Clinic and decided on rate control strategy.   Echo 7/23 showed EF 20-25%, LV global HK, RV mildly reduced, moderate MR and TR  Admitted 10/23 with a/c CHF in setting of indiscretion with sodium intake. He was diuresed with IV lasix. Initially coreg and farxiga held, but added back on discharge. ReDS at discharge 34%.  Follow up 11/23, chronic NYHA III and volume stable.   Today he returns for HF follow up with his wife. Says he is doing Ok. Denies CP or SOB. Gets slightly lightheaded on  standing but goes away quickly. No edema, orthopnea or PND.   Review of systems complete and found to be negative unless listed in HPI.    Past Medical History:  Diagnosis Date   Acute bronchitis with COPD ([facility]) 09/10/2016   Anemia    Atrial fibrillation or flutter    maintaining sinus on amiodarone     Bladder cancer ([facility]) dx'd 05/2012   BPH (benign prostatic hyperplasia)    CAD (coronary artery disease)     a. s/p anterior MI 12/05 c/b shock -> stent LAD   b. s/p stenting OM-1, 2/06   CHF (congestive heart failure) ([facility])    due to ischemic CM  a. EF 20-30%. (Nov 2008)   b. s/p St. Jude BiV-ICD    c. 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/16/2013   Major depression, single episode, in complete remission ([facility]) 08/16/2016   Neuromuscular disorder ([facility])  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"   no neurologist   Osteoarthritis 03/08/2011   Skin cancer    basal cells   facial x 4, 1 right forearm   Sleep apnea    STOP BANG SCORE 4   Current Outpatient Medications  Medication Sig Dispense Refill   acetaminophen (TYLENOL) 500 MG tablet Take 500-1,000 mg by mouth every 6 (six) hours as needed (for pain).     apixaban (ELIQUIS) 2.5 MG TABS tablet Take 1 tablet (2.5 mg total) by mouth 2 (two) times daily. 60 tablet 11   carboxymethylcellulose (REFRESH PLUS) 0.5 % SOLN Place 1 drop into both eyes 3 (three) times daily as needed (dry/irritated eyes.).     carvedilol (COREG) 3.125 MG tablet Take 1 tablet (3.125  mg total) by mouth 2 (two) times daily with a meal. 60 tablet 11   cephALEXin (KEFLEX) 500 MG capsule Take 2,000 mg by mouth See admin instructions. Take 2,000 mg by mouth 45 minutes before dental procedures     ezetimibe (ZETIA) 10 MG tablet Take 10 mg by mouth daily with supper.     FARXIGA 10 MG TABS tablet TAKE 1 TABLET BY MOUTH EVERY DAY BEFORE BREAKFAST 90 tablet 3   ferrous sulfate 325 (65 FE) MG tablet Take 325 mg by mouth daily with supper.     furosemide (LASIX) 20 MG tablet Take 1 tablet (20 mg total) by mouth 2 (two) times daily. Take an extra dose of 20mg  for weight gain-3lbs in 1 day or 5lbs in 1 week 60 tablet 1   KLOR-CON M10 10 MEQ tablet Take 10 mEq by mouth in the morning.     levothyroxine (SYNTHROID) 75 MCG tablet TAKE 1 TABLET BY MOUTH EVERY DAY BEFORE BREAKFAST 90 tablet 1   Multiple Vitamin (MULTIVITAMIN WITH MINERALS) TABS tablet Take 1 tablet by mouth daily with supper. Centrum Silver     nitroGLYCERIN (NITROSTAT) 0.4 MG SL tablet DISSOLVE ONE TABLET UNDER THE TONGUE EVERY 5 MINUTES AS NEEDED FOR CHEST PAIN.  DO NOT EXCEED A TOTAL OF 3 DOSES IN 15 MINUTES 25 tablet 0   pantoprazole (PROTONIX) 40 MG tablet Take 40 mg by mouth every evening.     rosuvastatin (CRESTOR) 20 MG tablet Take 20 mg by mouth every evening.     sodium chloride (OCEAN) 0.65 % SOLN nasal spray Place 1 spray into both nostrils as needed for congestion.     tamsulosin (FLOMAX) 0.4 MG CAPS capsule Take 0.4 mg by mouth at bedtime.      triamcinolone (NASACORT) 55 MCG/ACT AERO nasal inhaler Place 2 sprays into the nose daily as needed.     No current facility-administered medications for this encounter.   Wt Readings from Last 3 Encounters:  04/22/23 71.6 kg (157 lb 12.8 oz)  12/23/22 72.1 kg (159 lb)  11/25/22 73.9 kg (163 lb)    BP (!) 92/50   Pulse 82   Wt 71.6 kg (157 lb 12.8 oz)   SpO2 96%   BMI 23.30 kg/m   Physical Exam General:  Elderly. No resp difficulty HEENT: normal Neck: supple.  no JVD. Carotids 2+ bilat; no bruits. No lymphadenopathy or thryomegaly appreciated. Cor: PMI nondisplaced. Regular rate & rhythm. No rubs, gallops or murmurs. Lungs: clear Abdomen: soft, nontender, nondistended. No hepatosplenomegaly. No bruits or masses. Good bowel sounds. Extremities: no cyanosis, clubbing, rash, edema Neuro: alert & orientedx3, cranial nerves grossly intact. moves all 4 extremities w/o difficulty. Affect pleasant  Device interrogation No VT/AF volume  ok. Personally reviewed  ASSESSMENT & PLAN:  1. Chronic systolic CHF - S/p BIV ICD - Echo 10/13/2017 LVEF 20-25%  - Echo 2/19 EF 20-25% - Echo 07/23: EF 20-25%, RV mildly reduced, moderate MR - Stable chronic NYHA IIII. Volume ok - Continue Lasix 20 mg bid. - Continue Coreg 3.125 mg bid. (cut back due to low BP) - Continue Farxiga 10 mg daily. - No ARB.MRA with CKD IV and low BP - Has labs this week with Dr. Timothy Lasso   2. CAD - No s/s ischemia  - Off ASA due to Eliquis.  - Continue statin + Zetia. LDL goal < 70 - Lipids followed by Dr. Timothy Lasso. LDL 64 (1/24).  3. PAF -> now chronic AF - Remains in rate controlled AF - Saw Dr. Graciela Husbands and Rudi Coco in AF Clinic and decided on rate control strategy.  - Continue Eliquis 2.5 mg bid. No bleeding  - Labs per PCP later this week  4. CKD, Stage IV  - Creatinine baseline 2.2-2.3  - Follows with Dr. Marisue Humble - Continue Marcelline Deist - Labs per PCP later this week  Arvilla Meres, MD  3:47 PM

## 2023-04-25 DIAGNOSIS — I700 Atherosclerosis of aorta: Secondary | ICD-10-CM | POA: Diagnosis not present

## 2023-04-25 DIAGNOSIS — I255 Ischemic cardiomyopathy: Secondary | ICD-10-CM | POA: Diagnosis not present

## 2023-04-25 DIAGNOSIS — I2510 Atherosclerotic heart disease of native coronary artery without angina pectoris: Secondary | ICD-10-CM | POA: Diagnosis not present

## 2023-04-25 DIAGNOSIS — N184 Chronic kidney disease, stage 4 (severe): Secondary | ICD-10-CM | POA: Diagnosis not present

## 2023-04-25 DIAGNOSIS — E039 Hypothyroidism, unspecified: Secondary | ICD-10-CM | POA: Diagnosis not present

## 2023-04-25 DIAGNOSIS — I5022 Chronic systolic (congestive) heart failure: Secondary | ICD-10-CM | POA: Diagnosis not present

## 2023-04-25 DIAGNOSIS — D692 Other nonthrombocytopenic purpura: Secondary | ICD-10-CM | POA: Diagnosis not present

## 2023-04-25 DIAGNOSIS — D696 Thrombocytopenia, unspecified: Secondary | ICD-10-CM | POA: Diagnosis not present

## 2023-04-25 DIAGNOSIS — I130 Hypertensive heart and chronic kidney disease with heart failure and stage 1 through stage 4 chronic kidney disease, or unspecified chronic kidney disease: Secondary | ICD-10-CM | POA: Diagnosis not present

## 2023-04-25 DIAGNOSIS — E46 Unspecified protein-calorie malnutrition: Secondary | ICD-10-CM | POA: Diagnosis not present

## 2023-04-25 DIAGNOSIS — D6869 Other thrombophilia: Secondary | ICD-10-CM | POA: Diagnosis not present

## 2023-04-25 DIAGNOSIS — I480 Paroxysmal atrial fibrillation: Secondary | ICD-10-CM | POA: Diagnosis not present

## 2023-04-28 ENCOUNTER — Encounter: Payer: Self-pay | Admitting: Internal Medicine

## 2023-05-23 ENCOUNTER — Other Ambulatory Visit (HOSPITAL_COMMUNITY): Payer: Self-pay | Admitting: Internal Medicine

## 2023-06-04 ENCOUNTER — Encounter: Payer: Self-pay | Admitting: Podiatry

## 2023-06-04 ENCOUNTER — Ambulatory Visit (INDEPENDENT_AMBULATORY_CARE_PROVIDER_SITE_OTHER): Payer: Medicare Other | Admitting: Podiatry

## 2023-06-04 DIAGNOSIS — M79675 Pain in left toe(s): Secondary | ICD-10-CM

## 2023-06-04 DIAGNOSIS — M79674 Pain in right toe(s): Secondary | ICD-10-CM

## 2023-06-04 DIAGNOSIS — B351 Tinea unguium: Secondary | ICD-10-CM | POA: Diagnosis not present

## 2023-06-04 DIAGNOSIS — G629 Polyneuropathy, unspecified: Secondary | ICD-10-CM

## 2023-06-04 NOTE — Progress Notes (Signed)
  Subjective:  Patient ID: [name], male    DOB: [birth year],   MRN: [id number]  Chief Complaint  Patient presents with   Nail Problem    RFC    89 y.o. male presents for concern of thickened elongated and painful nails that are difficult to trim. Requesting to have them trimmed today.   PCP:  Creola Corn, MD    . Denies any other pedal complaints. Denies n/v/f/c.   Past Medical History:  Diagnosis Date   Acute bronchitis with COPD (HCC) 09/10/2016   Anemia    Atrial fibrillation or flutter    maintaining sinus on amiodarone     Bladder cancer (HCC) dx'd 05/2012   BPH (benign prostatic hyperplasia)    CAD (coronary artery disease)     a. s/p anterior MI 12/05 c/b shock -> stent LAD   b. s/p stenting OM-1, 2/06   CHF (congestive heart failure) (HCC)    due to ischemic CM  a. EF 20-30%. (Nov 2008)   b. s/p St. Jude BiV-ICD    c. 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 CFT <3 seconds. Normal hair growth on digits. No edema.  Skin. No lacerations or abrasions bilateral feet. Nails 1-5 bilateral are thickened elongated and dystrophic.   Musculoskeletal: MMT 5/5 bilateral lower extremities in DF, PF, Inversion and Eversion. Deceased ROM in DF of ankle joint.  Neurological: Sensation intact to light touch.   Assessment:   1. Pain due to onychomycosis of toenails of both feet   2. Peripheral polyneuropathy      Plan:  Patient was evaluated and treated and all questions answered. -Mechanically debrided all nails 1-5 bilateral using sterile nail nipper and filed with dremel without incident  -Answered all patient questions -Patient to return  in 3 months for at risk foot care -Patient advised to call the office if any problems or questions arise in the meantime.   Louann Sjogren, DPM

## 2023-06-13 ENCOUNTER — Other Ambulatory Visit (HOSPITAL_COMMUNITY): Payer: Self-pay | Admitting: Internal Medicine

## 2023-06-13 DIAGNOSIS — I509 Heart failure, unspecified: Secondary | ICD-10-CM | POA: Diagnosis not present

## 2023-06-13 DIAGNOSIS — N184 Chronic kidney disease, stage 4 (severe): Secondary | ICD-10-CM | POA: Diagnosis not present

## 2023-06-13 DIAGNOSIS — I129 Hypertensive chronic kidney disease with stage 1 through stage 4 chronic kidney disease, or unspecified chronic kidney disease: Secondary | ICD-10-CM | POA: Diagnosis not present

## 2023-06-13 DIAGNOSIS — D472 Monoclonal gammopathy: Secondary | ICD-10-CM | POA: Diagnosis not present

## 2023-06-30 ENCOUNTER — Ambulatory Visit (INDEPENDENT_AMBULATORY_CARE_PROVIDER_SITE_OTHER): Payer: Medicare Other

## 2023-06-30 DIAGNOSIS — I255 Ischemic cardiomyopathy: Secondary | ICD-10-CM

## 2023-06-30 DIAGNOSIS — I5022 Chronic systolic (congestive) heart failure: Secondary | ICD-10-CM

## 2023-07-01 LAB — CUP PACEART REMOTE DEVICE CHECK
Battery Remaining Longevity: 40 mo
Battery Remaining Percentage: 54 %
Battery Voltage: 2.95 V
Brady Statistic AP VP Percent: 28 %
Brady Statistic AP VS Percent: 3.6 %
Brady Statistic AS VP Percent: 60 %
Brady Statistic AS VS Percent: 3.2 %
Brady Statistic RA Percent Paced: 9 %
Date Time Interrogation Session: 20240916020026
HighPow Impedance: 48 Ohm
HighPow Impedance: 48 Ohm
Implantable Lead Connection Status: 753985
Implantable Lead Connection Status: 753985
Implantable Lead Connection Status: 753985
Implantable Lead Implant Date: 20060509
Implantable Lead Implant Date: 20060509
Implantable Lead Implant Date: 20090417
Implantable Lead Location: 753858
Implantable Lead Location: 753859
Implantable Lead Location: 753860
Implantable Lead Model: 5076
Implantable Lead Model: 7001
Implantable Pulse Generator Implant Date: 20211220
Lead Channel Impedance Value: 350 Ohm
Lead Channel Impedance Value: 390 Ohm
Lead Channel Impedance Value: 410 Ohm
Lead Channel Pacing Threshold Amplitude: 0.75 V
Lead Channel Pacing Threshold Amplitude: 1 V
Lead Channel Pacing Threshold Amplitude: 1.375 V
Lead Channel Pacing Threshold Pulse Width: 0.5 ms
Lead Channel Pacing Threshold Pulse Width: 0.8 ms
Lead Channel Pacing Threshold Pulse Width: 1 ms
Lead Channel Sensing Intrinsic Amplitude: 0.6 mV
Lead Channel Sensing Intrinsic Amplitude: 11.7 mV
Lead Channel Setting Pacing Amplitude: 2 V
Lead Channel Setting Pacing Amplitude: 2.375
Lead Channel Setting Pacing Amplitude: 2.5 V
Lead Channel Setting Pacing Pulse Width: 0.8 ms
Lead Channel Setting Pacing Pulse Width: 1 ms
Lead Channel Setting Sensing Sensitivity: 0.5 mV
Pulse Gen Serial Number: 9905796

## 2023-07-14 NOTE — Progress Notes (Signed)
Remote ICD transmission.   

## 2023-08-06 ENCOUNTER — Other Ambulatory Visit (HOSPITAL_COMMUNITY): Payer: Self-pay | Admitting: Internal Medicine

## 2023-09-03 ENCOUNTER — Ambulatory Visit: Payer: Medicare Other | Admitting: Podiatry

## 2023-09-29 ENCOUNTER — Ambulatory Visit (INDEPENDENT_AMBULATORY_CARE_PROVIDER_SITE_OTHER): Payer: Medicare Other

## 2023-09-29 DIAGNOSIS — I255 Ischemic cardiomyopathy: Secondary | ICD-10-CM

## 2023-09-29 DIAGNOSIS — I5022 Chronic systolic (congestive) heart failure: Secondary | ICD-10-CM

## 2023-09-29 LAB — CUP PACEART REMOTE DEVICE CHECK
Battery Remaining Longevity: 38 mo
Battery Remaining Percentage: 51 %
Battery Voltage: 2.93 V
Brady Statistic AP VP Percent: 27 %
Brady Statistic AP VS Percent: 3.5 %
Brady Statistic AS VP Percent: 61 %
Brady Statistic AS VS Percent: 3.2 %
Brady Statistic RA Percent Paced: 8.9 %
Date Time Interrogation Session: 20241216034834
HighPow Impedance: 47 Ohm
HighPow Impedance: 47 Ohm
Implantable Lead Connection Status: 753985
Implantable Lead Connection Status: 753985
Implantable Lead Connection Status: 753985
Implantable Lead Implant Date: 20060509
Implantable Lead Implant Date: 20060509
Implantable Lead Implant Date: 20090417
Implantable Lead Location: 753858
Implantable Lead Location: 753859
Implantable Lead Location: 753860
Implantable Lead Model: 5076
Implantable Lead Model: 7001
Implantable Pulse Generator Implant Date: 20211220
Lead Channel Impedance Value: 390 Ohm
Lead Channel Impedance Value: 400 Ohm
Lead Channel Impedance Value: 480 Ohm
Lead Channel Pacing Threshold Amplitude: 0.75 V
Lead Channel Pacing Threshold Amplitude: 1 V
Lead Channel Pacing Threshold Amplitude: 1.375 V
Lead Channel Pacing Threshold Pulse Width: 0.5 ms
Lead Channel Pacing Threshold Pulse Width: 0.8 ms
Lead Channel Pacing Threshold Pulse Width: 1 ms
Lead Channel Sensing Intrinsic Amplitude: 0.6 mV
Lead Channel Sensing Intrinsic Amplitude: 11.7 mV
Lead Channel Setting Pacing Amplitude: 2 V
Lead Channel Setting Pacing Amplitude: 2.375
Lead Channel Setting Pacing Amplitude: 2.5 V
Lead Channel Setting Pacing Pulse Width: 0.8 ms
Lead Channel Setting Pacing Pulse Width: 1 ms
Lead Channel Setting Sensing Sensitivity: 0.5 mV
Pulse Gen Serial Number: 9905796

## 2023-10-14 ENCOUNTER — Encounter: Payer: Self-pay | Admitting: Podiatry

## 2023-10-14 ENCOUNTER — Ambulatory Visit (INDEPENDENT_AMBULATORY_CARE_PROVIDER_SITE_OTHER): Payer: Medicare Other | Admitting: Podiatry

## 2023-10-14 DIAGNOSIS — B351 Tinea unguium: Secondary | ICD-10-CM | POA: Diagnosis not present

## 2023-10-14 DIAGNOSIS — M79675 Pain in left toe(s): Secondary | ICD-10-CM

## 2023-10-14 DIAGNOSIS — M79674 Pain in right toe(s): Secondary | ICD-10-CM

## 2023-10-14 NOTE — Progress Notes (Signed)
 Subjective:  Patient ID: [name], male    DOB: 04/20/1932,  MRN: [id number]   [name] presents to clinic today for:  Chief Complaint  Patient presents with   Nail Problem    Patient is here for routine nail care   Patient notes nails are thick, discolored, elongated and painful in shoegear when trying to ambulate.  His wife is with him today.  He has a sore nail along the left great toenail, medial nail margin.  Denies drainage.  He thinks he already has his next appointment with Dr. Gaynel scheduled.    PCP is Onita Rush, MD.  Past Medical History:  Diagnosis Date   Acute bronchitis with COPD (HCC) 09/10/2016   Anemia    Atrial fibrillation or flutter    maintaining sinus on amiodarone      Bladder cancer (HCC) dx'd 05/2012   BPH (benign prostatic hyperplasia)    CAD (coronary artery disease)     a. s/p anterior MI 12/05 c/b shock -> stent LAD   b. s/p stenting OM-1, 2/06   CHF (congestive heart failure) (HCC)    due to ischemic CM  a. EF 20-30%. (Nov 2008)   b. s/p St. Jude BiV-ICD    c. 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/16/2013   Major depression, single episode, in complete remission (HCC) 08/16/2016   Neuromuscular disorder (HCC)    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   no neurologist   Osteoarthritis 03/08/2011   Skin cancer    basal cells   facial x 4, 1 right forearm   Sleep apnea    STOP BANG SCORE 4    Past Surgical History:  Procedure Laterality  Date   BACK SURGERY  1972   lower back   BIV ICD GENERATOR CHANGEOUT N/A 10/02/2020   Procedure: BIV ICD GENERATOR CHANGEOUT;  Surgeon: Fernande Elspeth BROCKS, MD;  Location: [facility] INVASIVE CV LAB;  Service: Cardiovascular;  Laterality: N/A;   CARDIAC DEFIBRILLATOR PLACEMENT     ICD St Jude; gen change 09-20-13   CERVICAL LAMINECTOMY  1985   COLONOSCOPY     CORONARY ANGIOPLASTY  2005,2006   CYSTOSCOPY W/ RETROGRADES  05/25/2012   Procedure: CYSTOSCOPY WITH RETROGRADE PYELOGRAM;  Surgeon: Toribio Neysa Repine, MD;  Location: [facility];  Service: Urology;  Laterality: Bilateral;   CYSTOSCOPY W/ URETERAL STENT PLACEMENT  07/08/2012   Procedure: CYSTOSCOPY WITH STENT REPLACEMENT;  Surgeon: Toribio Neysa Repine, MD;  Location: [facility];  Service: Urology;  Laterality: Left;   CYSTOSCOPY W/ URETERAL STENT REMOVAL  07/08/2012   Procedure: CYSTOSCOPY WITH STENT REMOVAL;  Surgeon: Toribio Neysa Repine, MD;  Location: [facility];  Service: Urology;  Laterality: Bilateral;   CYSTOSCOPY/RETROGRADE/URETEROSCOPY  07/08/2012   Procedure: CYSTOSCOPY/RETROGRADE/URETEROSCOPY;  Surgeon: Toribio Neysa Repine, MD;  Location: [facility];  Service: Urology;  Laterality: Left;   ESOPHAGOGASTRODUODENOSCOPY     HERNIA REPAIR  1989  bilateral   IMPLANTABLE CARDIOVERTER DEFIBRILLATOR (ICD) GENERATOR CHANGE N/A 09/20/2013   Procedure: ICD GENERATOR CHANGE;  Surgeon: Elspeth JAYSON Sage, MD;  Location: [facility] CATH LAB;  Service: Cardiovascular;  Laterality: N/A;   TEE WITHOUT CARDIOVERSION N/A 12/10/2017   Procedure: TRANSESOPHAGEAL ECHOCARDIOGRAM (TEE);  Surgeon: Jeffrie Oneil JAYSON, MD;  Location: [facility] ENDOSCOPY;  Service: Cardiovascular;  Laterality: N/A;   TRANSURETHRAL RESECTION OF BLADDER TUMOR  05/25/2012   cold cup biopsy prostate   TRANSURETHRAL RESECTION OF BLADDER TUMOR  07/08/2012   Procedure: TRANSURETHRAL RESECTION OF BLADDER TUMOR (TURBT);  Surgeon: Toribio Neysa Repine, MD;  Location: [facility];  Service: Urology;  Laterality: N/A;  CYSTO, TURBT  W/ GYRUS, BILATERAL STENT REMOVAL, LEFT URETEROSCOPY WITH BIOPSY, POSSIBLE LEFT STENT PLACEMENT     Allergies  Allergen Reactions   Amoxicillin Hives and Rash   Gabapentin Itching   Penicillin G Sodium Itching and Rash   Penicillins Itching and Rash    Has patient had a PCN reaction causing immediate rash, facial/tongue/throat swelling, SOB or lightheadedness with hypotension: No Has patient had a PCN reaction causing severe rash involving mucus membranes or skin necrosis: Yes-back and hands. Has patient had a PCN reaction that required hospitalization: No Has patient had a PCN reaction occurring within the last 10 years: Yes If all of the above answers are NO, then may proceed with Cephalosporin use.   Ramipril Cough    Review of Systems: Negative except as noted in the HPI.  Objective:  NICKALAS MCCARRICK is a pleasant 89 y.o. male in NAD. AAO x 3.  Vascular Examination: Capillary refill time is 3-5 seconds to toes bilateral. Palpable pedal pulses b/l LE. Digital hair present b/l.  Skin temperature gradient WNL b/l. No varicosities b/l. No cyanosis noted b/l.   Dermatological Examination: Pedal skin with normal turgor, texture and tone b/l. No open wounds. No interdigital macerations b/l. Toenails x10 are 3mm thick, discolored, dystrophic with subungual debris. There is pain with compression of the nail plates.  They are elongated x10  Assessment/Plan: 1. Pain due to onychomycosis of toenails of both feet    The mycotic toenails were sharply debrided x10 with sterile nail nippers and a power debriding burr to decrease bulk/thickness and length.  The left hallux lateral nail border was cut back to alleviate discomfort.    Return in about 3 months (around 01/12/2024) for RFC.   Awanda CHARM Imperial, DPM, FACFAS [facility]     [address] [place], [place] [address]                Office [phone]  Fax [phone]       Subjective:  Patient ID: [name], male    DOB: [birth year],  MRN: [id number]   [name] presents to clinic today for:  Chief Complaint  Patient presents with   Nail Problem    Patient is here for routine nail care   Patient notes nails are thick, discolored, elongated and painful in shoegear when trying to ambulate.    PCP is Onita Rush, MD.  Past Medical History:  Diagnosis Date   Acute bronchitis with COPD (HCC) 09/10/2016   Anemia    Atrial fibrillation or flutter    maintaining  sinus on amiodarone      Bladder cancer (HCC) dx'd 05/2012   BPH (benign prostatic hyperplasia)    CAD (coronary artery disease)     a. s/p anterior MI 12/05 c/b shock -> stent LAD   b. s/p stenting OM-1, 2/06   CHF (congestive heart failure) (HCC)    due to ischemic CM  a. EF 20-30%. (Nov 2008)   b. s/p St. Jude BiV-ICD    c. 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/16/2013   Major depression, single episode, in complete remission (HCC) 08/16/2016   Neuromuscular disorder (HCC)    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   no neurologist   Osteoarthritis 03/08/2011   Skin cancer    basal cells   facial x 4, 1 right forearm   Sleep apnea    STOP BANG SCORE 4    Past Surgical History:  Procedure Laterality Date   BACK SURGERY  1972   lower back   BIV ICD GENERATOR CHANGEOUT N/A 10/02/2020   Procedure: BIV ICD GENERATOR CHANGEOUT;  Surgeon: Fernande Elspeth BROCKS, MD;  Location: [facility] INVASIVE CV LAB;   Service: Cardiovascular;  Laterality: N/A;   CARDIAC DEFIBRILLATOR PLACEMENT     ICD St Jude; gen change 09-20-13   CERVICAL LAMINECTOMY  1985   COLONOSCOPY     CORONARY ANGIOPLASTY  2005,2006   CYSTOSCOPY W/ RETROGRADES  05/25/2012   Procedure: CYSTOSCOPY WITH RETROGRADE PYELOGRAM;  Surgeon: Toribio Neysa Repine, MD;  Location: [facility];  Service: Urology;  Laterality: Bilateral;   CYSTOSCOPY W/ URETERAL STENT PLACEMENT  07/08/2012   Procedure: CYSTOSCOPY WITH STENT REPLACEMENT;  Surgeon: Toribio Neysa Repine, MD;  Location: [facility];  Service: Urology;  Laterality: Left;   CYSTOSCOPY W/ URETERAL STENT REMOVAL  07/08/2012   Procedure: CYSTOSCOPY WITH STENT REMOVAL;  Surgeon: Toribio Neysa Repine, MD;  Location: [facility];  Service: Urology;  Laterality: Bilateral;   CYSTOSCOPY/RETROGRADE/URETEROSCOPY  07/08/2012   Procedure: CYSTOSCOPY/RETROGRADE/URETEROSCOPY;  Surgeon: Toribio Neysa Repine, MD;  Location: [facility];  Service: Urology;  Laterality: Left;   ESOPHAGOGASTRODUODENOSCOPY     HERNIA REPAIR  1989   bilateral   IMPLANTABLE CARDIOVERTER DEFIBRILLATOR (ICD) GENERATOR CHANGE N/A 09/20/2013   Procedure: ICD GENERATOR CHANGE;  Surgeon: Elspeth BROCKS Fernande, MD;  Location: [facility] CATH LAB;  Service: Cardiovascular;  Laterality: N/A;   TEE WITHOUT CARDIOVERSION N/A 12/10/2017   Procedure: TRANSESOPHAGEAL ECHOCARDIOGRAM (TEE);  Surgeon: Jeffrie Oneil BROCKS, MD;  Location: [facility] ENDOSCOPY;  Service: Cardiovascular;  Laterality: N/A;   TRANSURETHRAL RESECTION OF BLADDER TUMOR  05/25/2012   cold cup biopsy prostate   TRANSURETHRAL RESECTION OF BLADDER TUMOR  07/08/2012   Procedure: TRANSURETHRAL RESECTION OF BLADDER TUMOR (TURBT);  Surgeon: Toribio Neysa Repine, MD;  Location: [facility];  Service: Urology;  Laterality: N/A;  CYSTO, TURBT W/ GYRUS, BILATERAL STENT REMOVAL, LEFT URETEROSCOPY WITH BIOPSY, POSSIBLE LEFT STENT PLACEMENT     Allergies  Allergen Reactions   Amoxicillin Hives and Rash  Gabapentin Itching    Penicillin G Sodium Itching and Rash   Penicillins Itching and Rash    Has patient had a PCN reaction causing immediate rash, facial/tongue/throat swelling, SOB or lightheadedness with hypotension: No Has patient had a PCN reaction causing severe rash involving mucus membranes or skin necrosis: Yes-back and hands. Has patient had a PCN reaction that required hospitalization: No Has patient had a PCN reaction occurring within the last 10 years: Yes If all of the above answers are NO, then may proceed with Cephalosporin use.   Ramipril Cough    Review of Systems: Negative except as noted in the HPI.  Objective:  There were no vitals filed for this visit.  ABDULKAREEM BADOLATO is a pleasant 89 y.o. male in NAD. AAO x 3.  Vascular Examination: Capillary refill time is 3-5 seconds to toes bilateral. Palpable pedal pulses b/l LE. Digital hair present b/l.  Skin temperature gradient WNL b/l. No varicosities b/l. No cyanosis noted b/l.   Dermatological Examination: Pedal skin with normal turgor, texture and tone b/l. No open wounds. No interdigital macerations b/l. Toenails x10 are 3mm thick, discolored, dystrophic with subungual debris. There is pain with compression of the nail plates.  They are elongated x10  Neurological Examination: Protective sensation intact bilateral LE. Vibratory sensation intact bilateral LE.  Musculoskeletal Examination: Muscle strength 5/5 to all LE muscle groups b/l.       No data to display           Assessment/Plan: 1. Pain due to onychomycosis of toenails of both feet     No orders of the defined types were placed in this encounter.  None  The mycotic toenails were sharply debrided x10 with sterile nail nippers and a power debriding burr to decrease bulk/thickness and length.    Return in about 3 months (around 01/12/2024) for RFC.   Awanda CHARM Imperial, DPM, FACFAS [facility]     [address] [place], [place] [address]                Office [phone]  Fax [phone]

## 2023-10-24 DIAGNOSIS — D649 Anemia, unspecified: Secondary | ICD-10-CM | POA: Diagnosis not present

## 2023-10-31 DIAGNOSIS — E039 Hypothyroidism, unspecified: Secondary | ICD-10-CM | POA: Diagnosis not present

## 2023-10-31 DIAGNOSIS — R82998 Other abnormal findings in urine: Secondary | ICD-10-CM | POA: Diagnosis not present

## 2023-10-31 DIAGNOSIS — E46 Unspecified protein-calorie malnutrition: Secondary | ICD-10-CM | POA: Diagnosis not present

## 2023-10-31 DIAGNOSIS — Z95810 Presence of automatic (implantable) cardiac defibrillator: Secondary | ICD-10-CM | POA: Diagnosis not present

## 2023-10-31 DIAGNOSIS — I130 Hypertensive heart and chronic kidney disease with heart failure and stage 1 through stage 4 chronic kidney disease, or unspecified chronic kidney disease: Secondary | ICD-10-CM | POA: Diagnosis not present

## 2023-10-31 DIAGNOSIS — N184 Chronic kidney disease, stage 4 (severe): Secondary | ICD-10-CM | POA: Diagnosis not present

## 2023-10-31 DIAGNOSIS — J449 Chronic obstructive pulmonary disease, unspecified: Secondary | ICD-10-CM | POA: Diagnosis not present

## 2023-10-31 DIAGNOSIS — I5022 Chronic systolic (congestive) heart failure: Secondary | ICD-10-CM | POA: Diagnosis not present

## 2023-10-31 DIAGNOSIS — Z23 Encounter for immunization: Secondary | ICD-10-CM | POA: Diagnosis not present

## 2023-10-31 DIAGNOSIS — Z0000 Encounter for general adult medical examination without abnormal findings: Secondary | ICD-10-CM | POA: Diagnosis not present

## 2023-10-31 DIAGNOSIS — I472 Ventricular tachycardia, unspecified: Secondary | ICD-10-CM | POA: Diagnosis not present

## 2023-10-31 DIAGNOSIS — I480 Paroxysmal atrial fibrillation: Secondary | ICD-10-CM | POA: Diagnosis not present

## 2023-10-31 DIAGNOSIS — I255 Ischemic cardiomyopathy: Secondary | ICD-10-CM | POA: Diagnosis not present

## 2023-10-31 DIAGNOSIS — Z950 Presence of cardiac pacemaker: Secondary | ICD-10-CM | POA: Diagnosis not present

## 2023-10-31 DIAGNOSIS — R634 Abnormal weight loss: Secondary | ICD-10-CM | POA: Diagnosis not present

## 2023-10-31 DIAGNOSIS — E785 Hyperlipidemia, unspecified: Secondary | ICD-10-CM | POA: Diagnosis not present

## 2023-10-31 DIAGNOSIS — D649 Anemia, unspecified: Secondary | ICD-10-CM | POA: Diagnosis not present

## 2023-10-31 DIAGNOSIS — Z125 Encounter for screening for malignant neoplasm of prostate: Secondary | ICD-10-CM | POA: Diagnosis not present

## 2023-11-03 NOTE — Progress Notes (Signed)
Remote ICD transmission.   

## 2023-11-11 IMAGING — DX DG CHEST 2V
2 series · 2 of 2 positions shown · non-contrast
Comparison: Radiographs 10/12/2017.  CT 05/06/2005.

CLINICAL DATA: Cough with congestion, wheezing and fever.

EXAM:
CHEST - 2 VIEW

[w chest lat]
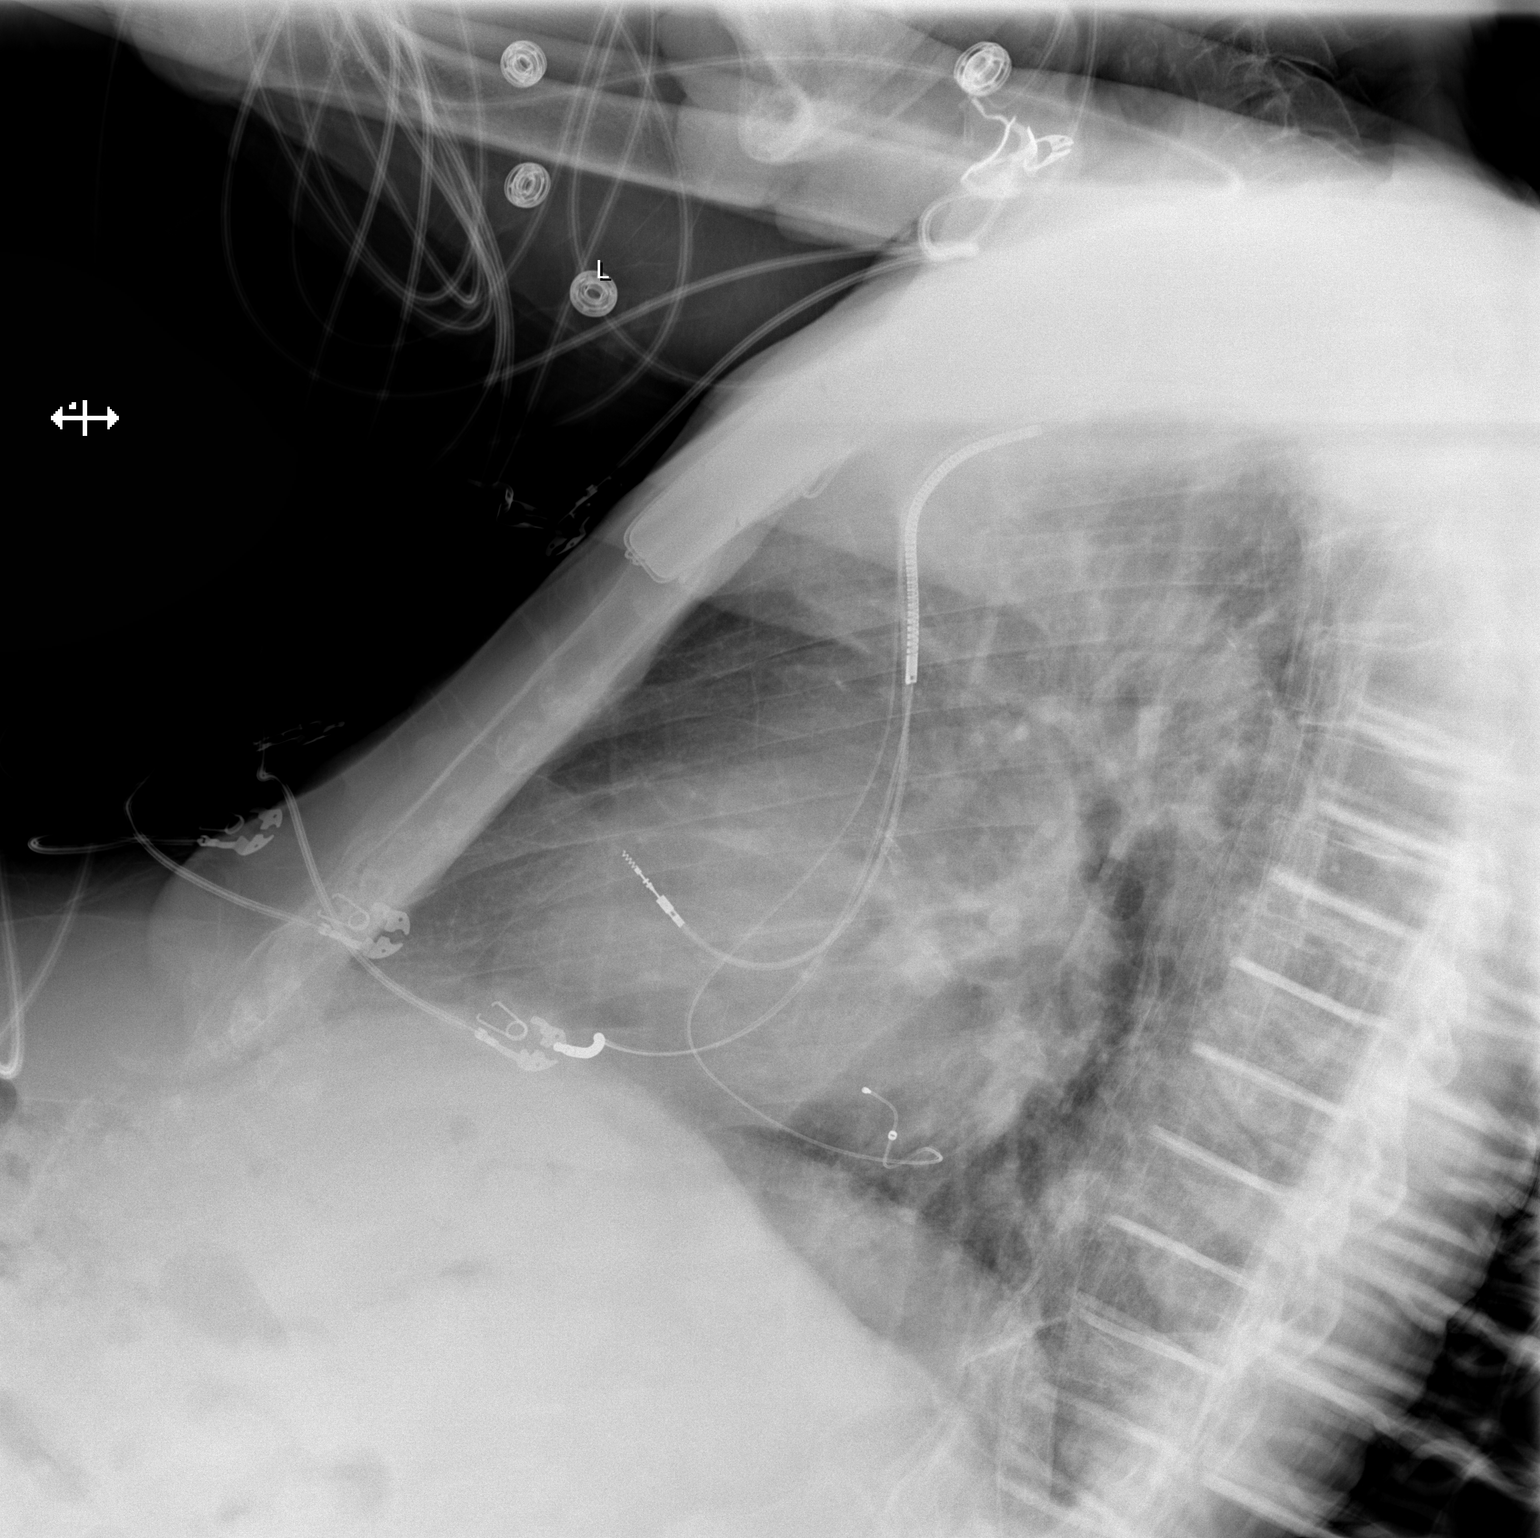

[x chest ap]
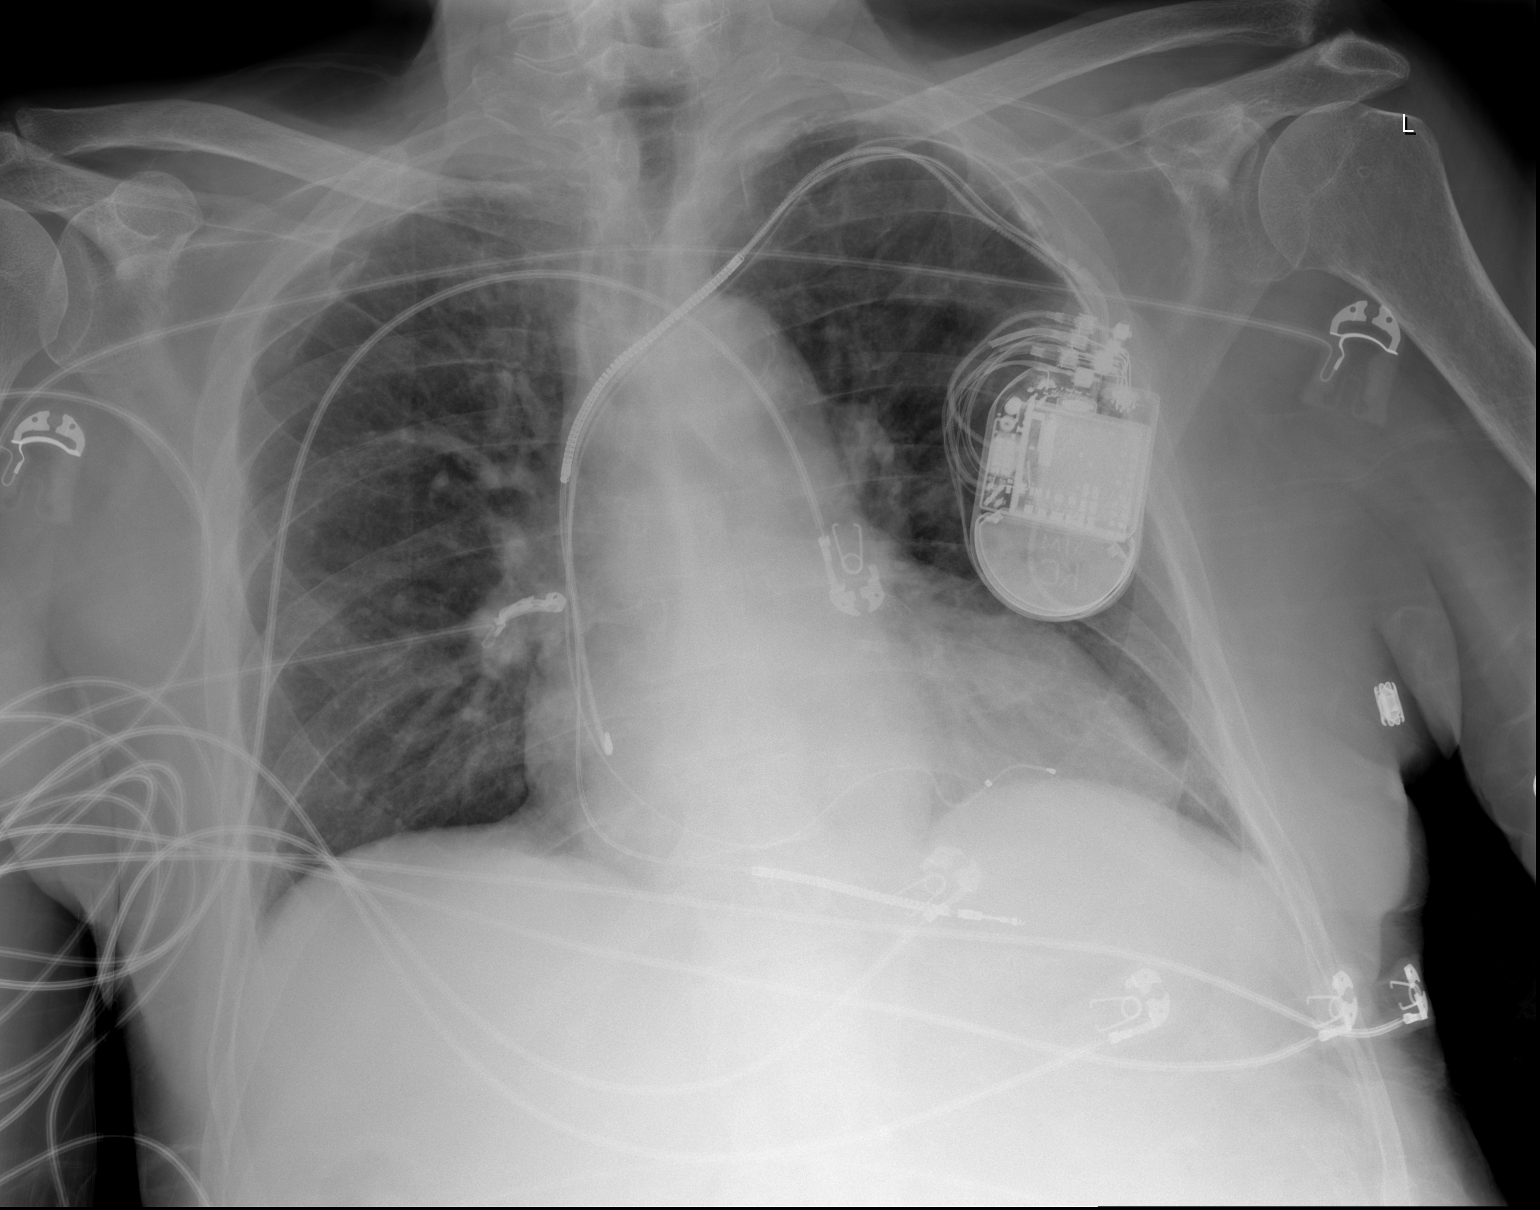

[2 of 2 positions shown; findings below may reference images not displayed]

FINDINGS: The lateral view is limited by a metallic structure overlying the
thoracic spine. Left subclavian biventricular AICD leads appear
unchanged. The heart size and mediastinal contours are stable. There
is mild chronic vascular congestion, but no evidence of edema,
confluent airspace opacity, pleural effusion or pneumothorax. No
acute osseous findings are evident with limited assessment of the
spine due to artifact.
IMPRESSION: Stable mild cardiomegaly and chronic vascular congestion. No acute
cardiopulmonary process identified.

## 2023-11-12 IMAGING — DX DG ABD PORTABLE 1V
1 series · 2 of 2 positions shown · non-contrast
Comparison: 09/03/2021 CT

CLINICAL DATA: Abdominal distension.

EXAM:
PORTABLE ABDOMEN - 1 VIEW

[Series 1: abdomen · 0.14mm/px · 2 of 2 slices shown]
[im 1/2]
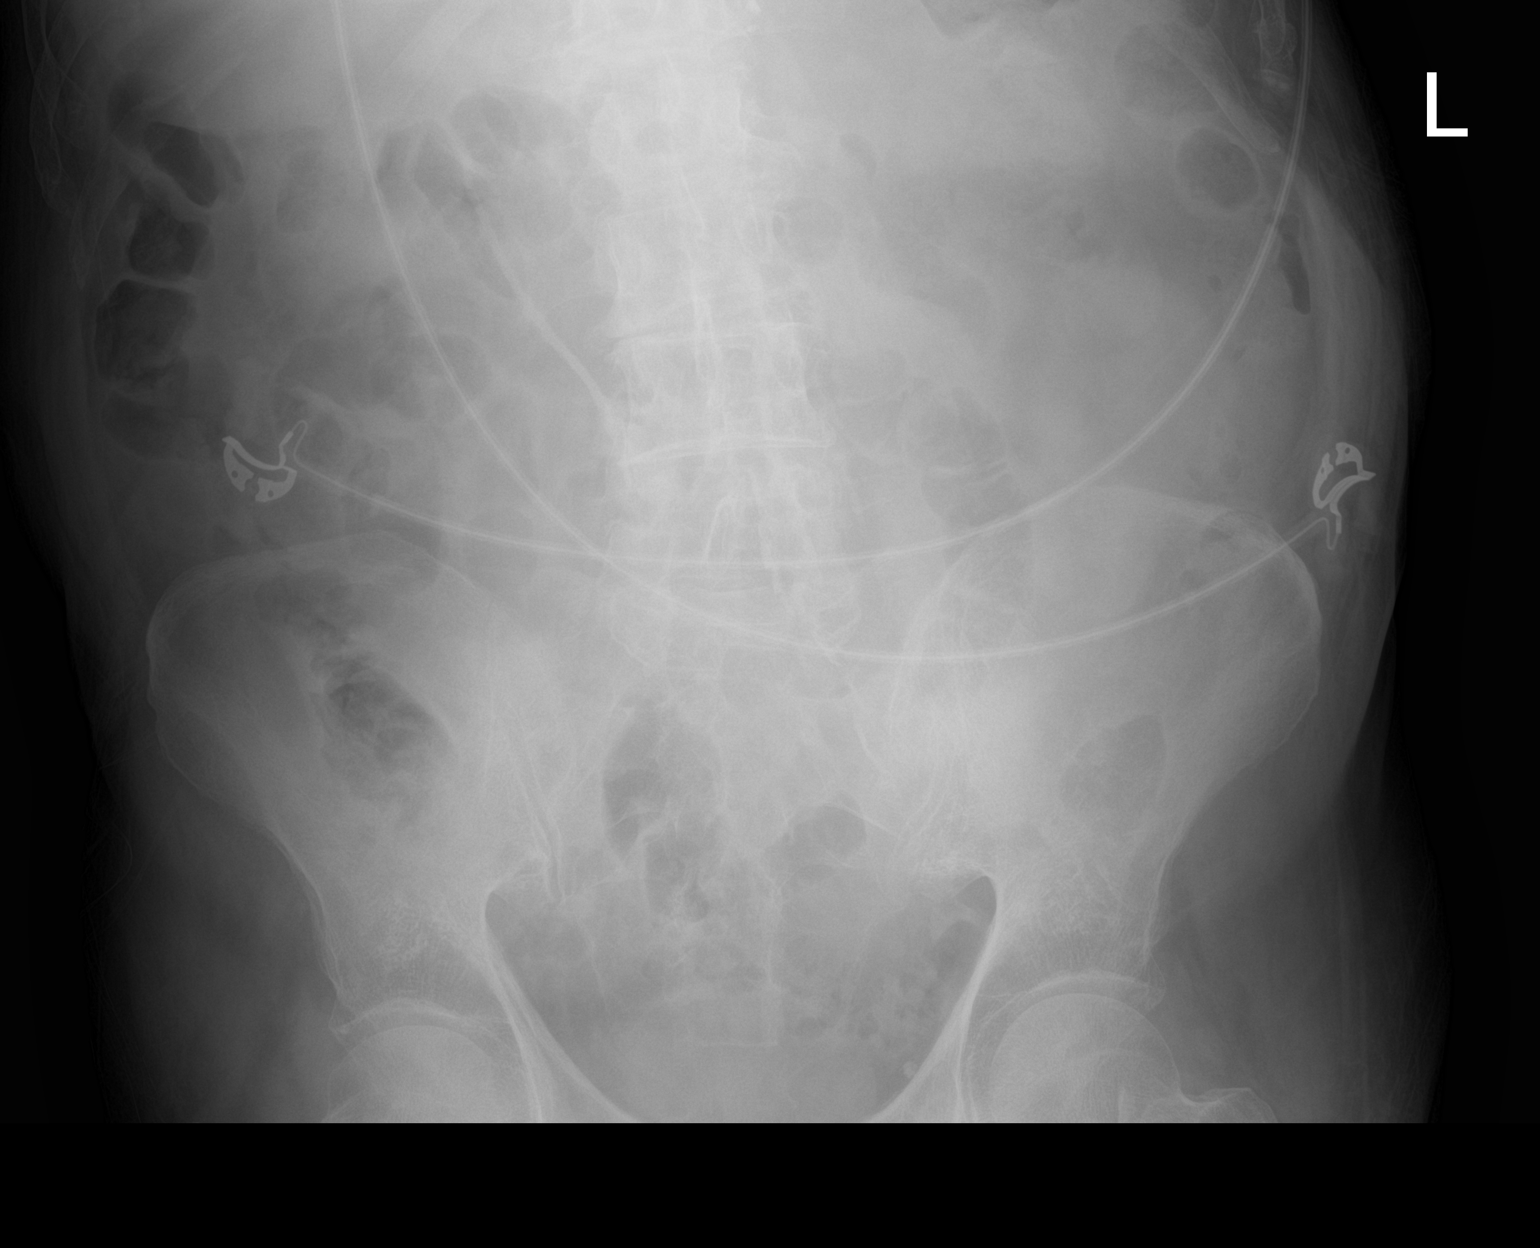
[im 2/2]
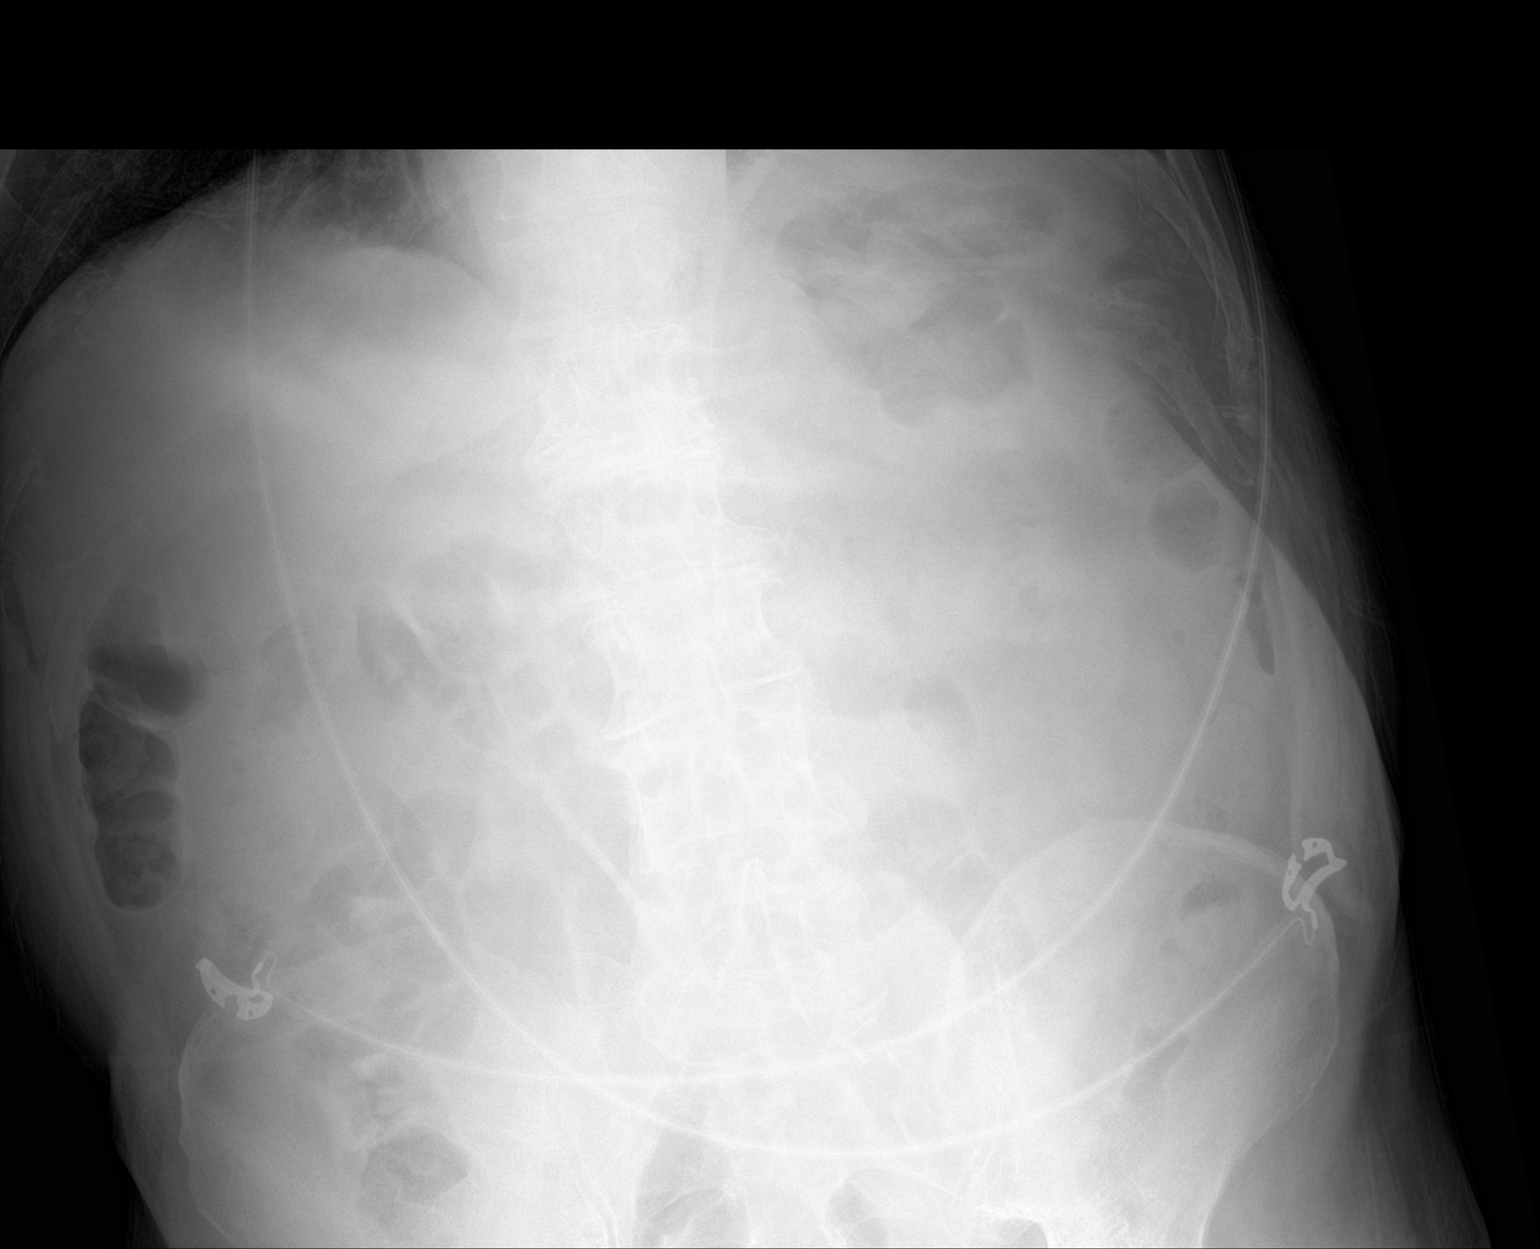

[2 of 2 positions shown; findings below may reference images not displayed]

FINDINGS: Nondistended gas-filled loops of small bowel and colon are noted.

No dilated bowel loops are present.

No suspicious calcifications are identified.
IMPRESSION: Nonspecific nonobstructive bowel gas pattern.

## 2023-12-19 ENCOUNTER — Other Ambulatory Visit (HOSPITAL_COMMUNITY): Payer: Self-pay | Admitting: Internal Medicine

## 2023-12-29 ENCOUNTER — Ambulatory Visit: Payer: Medicare Other

## 2023-12-29 DIAGNOSIS — I255 Ischemic cardiomyopathy: Secondary | ICD-10-CM

## 2023-12-29 DIAGNOSIS — I5022 Chronic systolic (congestive) heart failure: Secondary | ICD-10-CM

## 2023-12-30 LAB — CUP PACEART REMOTE DEVICE CHECK
Battery Remaining Longevity: 35 mo
Battery Remaining Percentage: 47 %
Battery Voltage: 2.93 V
Brady Statistic AP VP Percent: 27 %
Brady Statistic AP VS Percent: 3.5 %
Brady Statistic AS VP Percent: 61 %
Brady Statistic AS VS Percent: 3.2 %
Brady Statistic RA Percent Paced: 8.6 %
Date Time Interrogation Session: 20250317032347
HighPow Impedance: 48 Ohm
HighPow Impedance: 49 Ohm
Implantable Lead Connection Status: 753985
Implantable Lead Connection Status: 753985
Implantable Lead Connection Status: 753985
Implantable Lead Implant Date: 20060509
Implantable Lead Implant Date: 20060509
Implantable Lead Implant Date: 20090417
Implantable Lead Location: 753858
Implantable Lead Location: 753859
Implantable Lead Location: 753860
Implantable Lead Model: 5076
Implantable Lead Model: 7001
Implantable Pulse Generator Implant Date: 20211220
Lead Channel Impedance Value: 390 Ohm
Lead Channel Impedance Value: 400 Ohm
Lead Channel Impedance Value: 530 Ohm
Lead Channel Pacing Threshold Amplitude: 0.75 V
Lead Channel Pacing Threshold Amplitude: 1 V
Lead Channel Pacing Threshold Amplitude: 1.125 V
Lead Channel Pacing Threshold Pulse Width: 0.5 ms
Lead Channel Pacing Threshold Pulse Width: 0.8 ms
Lead Channel Pacing Threshold Pulse Width: 1 ms
Lead Channel Sensing Intrinsic Amplitude: 0.6 mV
Lead Channel Sensing Intrinsic Amplitude: 11.7 mV
Lead Channel Setting Pacing Amplitude: 2 V
Lead Channel Setting Pacing Amplitude: 2.125
Lead Channel Setting Pacing Amplitude: 2.5 V
Lead Channel Setting Pacing Pulse Width: 0.8 ms
Lead Channel Setting Pacing Pulse Width: 1 ms
Lead Channel Setting Sensing Sensitivity: 0.5 mV
Pulse Gen Serial Number: 9905796

## 2023-12-31 ENCOUNTER — Ambulatory Visit: Payer: Medicare Other | Attending: Internal Medicine | Admitting: Internal Medicine

## 2023-12-31 ENCOUNTER — Encounter: Payer: Self-pay | Admitting: Internal Medicine

## 2023-12-31 VITALS — BP 112/50 | HR 79 | Ht 69.0 in | Wt 158.0 lb

## 2023-12-31 DIAGNOSIS — I4821 Permanent atrial fibrillation: Secondary | ICD-10-CM | POA: Diagnosis not present

## 2023-12-31 DIAGNOSIS — I5022 Chronic systolic (congestive) heart failure: Secondary | ICD-10-CM | POA: Diagnosis not present

## 2023-12-31 DIAGNOSIS — Z95810 Presence of automatic (implantable) cardiac defibrillator: Secondary | ICD-10-CM | POA: Diagnosis not present

## 2023-12-31 DIAGNOSIS — I255 Ischemic cardiomyopathy: Secondary | ICD-10-CM | POA: Diagnosis not present

## 2023-12-31 NOTE — Progress Notes (Signed)
 Symptoms since      Patient Care Team: Creola Corn, MD as PCP - General (Internal Medicine) Gala Romney, Bevelyn Buckles, MD as Consulting Physician (Cardiology)   HPI  [name] is a 89 y.o. male seen in followup for ischemic heart myopathy congestive heart failure and prior CRT-D with generator replaced 12/14; 12/21.     Recurrent  atrial fibrillation. Managed with amiodarone -- had problems with neurotoxicity with gait instability and his amiodarone  decreased>>and then stopped 2/2 tremor  Resumed 2020 for recurrent afib and had tolerated.  However, it was stopped at the heart failure clinic 5/22   PERMANENT ATRIAL FIBRILLATION  The patient denies chest pain, nocturnal dyspnea, orthopnea or peripheral edem.  There have been no palpitations, lightheadedness or syncope.  Complains of limitations largely related to his arthritis and back pain..  Some shortness of breath..      Date Cr K Hgb  4/17 2.5      6/19     2/20 2.49 3.6 13.1  11/20 2.2  12.6  6/21 2.49 4.5 12.8  12/21 2.52 4.1 13.7  12/22 2.27 3.6 12.5  11/23} 2.19 3.8 12.9  8/24 1.97 4.4 13.5      DATE TEST EF   12/14 Echo   20-25 %   2/19 Echo  25%   7/23 Echo  20-25%      Thromboembolic risk factors ( age  -2, Vasc disease -1, CHF-1) for a CHADSVASc Score of >=4        Past Medical History:  Diagnosis Date   Acute bronchitis with COPD (HCC) 09/10/2016   Anemia    Atrial fibrillation or flutter    maintaining sinus on amiodarone     Bladder cancer (HCC) dx'd 05/2012   BPH (benign prostatic hyperplasia)    CAD (coronary artery disease)     a. s/p anterior MI 12/05 c/b shock -> stent LAD   b. s/p stenting OM-1, 2/06   CHF (congestive heart failure) (HCC)    due to ischemic CM  a. EF 20-30%. (Nov 2008)   b. s/p St. Jude BiV-ICD    c. CPX 07/2008  pvo2 16.3 (63% predicted) slope 34 RER 1.08 O2 pulse 93%   Cough    occasional /productive, no fever/ not new   CRI (chronic renal insufficiency)     (baseline 2.0-2.2)/ recent hospitalization 8/13   GERD (gastroesophageal reflux disease)    hx Barretts esophagitis   History of blood transfusion    following coumadin  usage   HTN (hypertension)    EKG 05/14/12,Chest x ray 8/13, last ICD interrogation 8/13 EPIC   Hyperlipidemia    Hypothyroidism    Left ventricular lead failure to capture on the ring electrode 12/16/2013   Major depression, single episode, in complete remission (HCC) 08/16/2016   Neuromuscular disorder (HCC)    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"   no neurologist   Osteoarthritis 03/08/2011   Skin cancer    basal cells   facial x 4, 1 right forearm   Sleep apnea    STOP BANG SCORE 4    Past Surgical History:  Procedure Laterality Date   BACK SURGERY  1972   lower back   BIV ICD GENERATOR CHANGEOUT N/A 10/02/2020   Procedure: BIV ICD GENERATOR CHANGEOUT;  Surgeon: Duke Salvia, MD;  Location: [facility] INVASIVE CV LAB;  Service: Cardiovascular;  Laterality: N/A;   CARDIAC DEFIBRILLATOR PLACEMENT     ICD St Jude; gen change 09-20-13  CERVICAL LAMINECTOMY  1985   COLONOSCOPY     CORONARY ANGIOPLASTY  2005,2006   CYSTOSCOPY W/ RETROGRADES  05/25/2012   Procedure: CYSTOSCOPY WITH RETROGRADE PYELOGRAM;  Surgeon: Milford Cage, MD;  Location: [facility] ORS;  Service: Urology;  Laterality: Bilateral;   CYSTOSCOPY W/ URETERAL STENT PLACEMENT  07/08/2012   Procedure: CYSTOSCOPY WITH STENT REPLACEMENT;  Surgeon: Milford Cage, MD;  Location: [facility];  Service: Urology;  Laterality: Left;   CYSTOSCOPY W/ URETERAL STENT REMOVAL  07/08/2012   Procedure: CYSTOSCOPY WITH STENT REMOVAL;  Surgeon: Milford Cage, MD;  Location: [facility];  Service: Urology;  Laterality: Bilateral;   CYSTOSCOPY/RETROGRADE/URETEROSCOPY  07/08/2012   Procedure: CYSTOSCOPY/RETROGRADE/URETEROSCOPY;  Surgeon: Milford Cage, MD;  Location: [facility];  Service: Urology;  Laterality: Left;   ESOPHAGOGASTRODUODENOSCOPY     HERNIA REPAIR  1989    bilateral   IMPLANTABLE CARDIOVERTER DEFIBRILLATOR (ICD) GENERATOR CHANGE N/A 09/20/2013   Procedure: ICD GENERATOR CHANGE;  Surgeon: Duke Salvia, MD;  Location: [facility] CATH LAB;  Service: Cardiovascular;  Laterality: N/A;   TEE WITHOUT CARDIOVERSION N/A 12/10/2017   Procedure: TRANSESOPHAGEAL ECHOCARDIOGRAM (TEE);  Surgeon: Jake Bathe, MD;  Location: [facility] ENDOSCOPY;  Service: Cardiovascular;  Laterality: N/A;   TRANSURETHRAL RESECTION OF BLADDER TUMOR  05/25/2012   cold cup biopsy prostate   TRANSURETHRAL RESECTION OF BLADDER TUMOR  07/08/2012   Procedure: TRANSURETHRAL RESECTION OF BLADDER TUMOR (TURBT);  Surgeon: Milford Cage, MD;  Location: [facility];  Service: Urology;  Laterality: N/A;  CYSTO, TURBT W/ GYRUS, BILATERAL STENT REMOVAL, LEFT URETEROSCOPY WITH BIOPSY, POSSIBLE LEFT STENT PLACEMENT     Current Outpatient Medications  Medication Sig Dispense Refill   acetaminophen (TYLENOL) 500 MG tablet Take 500-1,000 mg by mouth every 6 (six) hours as needed (for pain).     carboxymethylcellulose (REFRESH PLUS) 0.5 % SOLN Place 1 drop into both eyes 3 (three) times daily as needed (dry/irritated eyes.).     carvedilol (COREG) 3.125 MG tablet TAKE 1 TABLET BY MOUTH TWICE A DAY WITH A MEAL 180 tablet 3   cephALEXin (KEFLEX) 500 MG capsule Take 2,000 mg by mouth See admin instructions. Take 2,000 mg by mouth 45 minutes before dental procedures     ELIQUIS 2.5 MG TABS tablet TAKE 1 TABLET BY MOUTH TWICE A DAY 60 tablet 11   ezetimibe (ZETIA) 10 MG tablet Take 10 mg by mouth daily with supper.     FARXIGA 10 MG TABS tablet TAKE 1 TABLET BY MOUTH EVERY DAY BEFORE BREAKFAST 90 tablet 3   ferrous sulfate 325 (65 FE) MG tablet Take 325 mg by mouth daily with supper.     furosemide (LASIX) 20 MG tablet Take 1 tablet (20 mg total) by mouth 2 (two) times daily. Take an extra dose of 20mg  for weight gain-3lbs in 1 day or 5lbs in 1 week 60 tablet 1   KLOR-CON M10 10 MEQ tablet Take 10 mEq by mouth in  the morning.     levothyroxine (SYNTHROID) 75 MCG tablet TAKE 1 TABLET BY MOUTH EVERY DAY BEFORE BREAKFAST 90 tablet 1   Multiple Vitamin (MULTIVITAMIN WITH MINERALS) TABS tablet Take 1 tablet by mouth daily with supper. Centrum Silver     nitroGLYCERIN (NITROSTAT) 0.4 MG SL tablet DISSOLVE ONE TABLET UNDER THE TONGUE EVERY 5 MINUTES AS NEEDED FOR CHEST PAIN.  DO NOT EXCEED A TOTAL OF 3 DOSES IN 15 MINUTES 25 tablet 0   pantoprazole (PROTONIX) 40 MG tablet Take 40 mg by  mouth every evening.     rosuvastatin (CRESTOR) 20 MG tablet Take 20 mg by mouth every evening.     sodium chloride (OCEAN) 0.65 % SOLN nasal spray Place 1 spray into both nostrils as needed for congestion.     tamsulosin (FLOMAX) 0.4 MG CAPS capsule Take 0.4 mg by mouth at bedtime.      triamcinolone (NASACORT) 55 MCG/ACT AERO nasal inhaler Place 2 sprays into the nose daily as needed.     No current facility-administered medications for this visit.    Allergies  Allergen Reactions   Amoxicillin Hives and Rash   Gabapentin Itching   Penicillin G Sodium Itching and Rash   Penicillins Itching and Rash    Has patient had a PCN reaction causing immediate rash, facial/tongue/throat swelling, SOB or lightheadedness with hypotension: No Has patient had a PCN reaction causing severe rash involving mucus membranes or skin necrosis: Yes-back and hands. Has patient had a PCN reaction that required hospitalization: No Has patient had a PCN reaction occurring within the last 10 years: Yes If all of the above answers are "NO", then may proceed with Cephalosporin use.   Ramipril Cough    Review of Systems negative except from HPI and PMH  Physical Exam BP (!) 112/50   Pulse 79   Ht 5\' 9"  (1.753 m)   Wt 158 lb (71.7 kg)   SpO2 95%   BMI 23.33 kg/m  Well developed and well nourished in no acute distress HENT normal Neck supple with JVP-flat Clear Device pocket well healed; without hematoma or erythema.  There is no tethering   Regular rate and rhythm, no  gallop No murmur Abd-soft with active BS No Clubbing cyanosis  edema Skin-warm and dry A & Oriented  Grossly normal sensory and motor function  ECG atrial fibrillation at 77 with intermittent ventricular pacing Intervals-/14/41  Device function is normal. Programming changes increased LRL to 75 to increase BiVpacing See Paceart for details      Assessment and  Plan  Ischemic cardiomyopathy  CRT-d  St Jude status post generator replacement 12/21  Atrial fibrillation permanent  Heart failure-chronic systolic misread lost a walker with  Renal insufficiency grade 3-4    Heart rate control in atrial fibrillation is reasonable.  His intrinsic QRS duration is about 130 ms.  Paced QRS duration and morphology is upright QRS in lead V1 with the same duration.  Not sure that it matters a lot whether he is V-paced or not, we will try for now and see what happens by increasing his ventricular rate to 75 and paced more. GDMT is limited by hypotension.  Atrial fibrillation is permanent.  Continue Eliquis 2.5 twice daily.

## 2023-12-31 NOTE — Patient Instructions (Signed)
Medication Instructions:  Your physician recommends that you continue on your current medications as directed. Please refer to the Current Medication list given to you today.  *If you need a refill on your cardiac medications before your next appointment, please call your pharmacy*   Lab Work: None ordered.  If you have labs (blood work) drawn today and your tests are completely normal, you will receive your results only by: MyChart Message (if you have MyChart) OR A paper copy in the mail If you have any lab test that is abnormal or we need to change your treatment, we will call you to review the results.   Testing/Procedures: None ordered.    Follow-Up: At [facility], you and your health needs are our priority.  As part of our continuing mission to provide you with exceptional heart care, we have created designated Provider Care Teams.  These Care Teams include your primary Cardiologist (physician) and Advanced Practice Providers (APPs -  Physician Assistants and Nurse Practitioners) who all work together to provide you with the care you need, when you need it.  We recommend signing up for the patient portal called "MyChart".  Sign up information is provided on this After Visit Summary.  MyChart is used to connect with patients for Virtual Visits (Telemedicine).  Patients are able to view lab/test results, encounter notes, upcoming appointments, etc.  Non-urgent messages can be sent to your provider as well.   To learn more about what you can do with MyChart, go to [email or web address].    Your next appointment:   12 months with Dr Nelly Laurence

## 2024-01-05 ENCOUNTER — Encounter: Payer: Self-pay | Admitting: Podiatry

## 2024-01-05 ENCOUNTER — Ambulatory Visit (INDEPENDENT_AMBULATORY_CARE_PROVIDER_SITE_OTHER): Payer: Medicare Other | Admitting: Podiatry

## 2024-01-05 DIAGNOSIS — M79675 Pain in left toe(s): Secondary | ICD-10-CM | POA: Diagnosis not present

## 2024-01-05 DIAGNOSIS — M79674 Pain in right toe(s): Secondary | ICD-10-CM | POA: Diagnosis not present

## 2024-01-05 DIAGNOSIS — B351 Tinea unguium: Secondary | ICD-10-CM

## 2024-01-05 NOTE — Progress Notes (Unsigned)
   Subjective:  Patient ID: [name], male    DOB: 10/30/1956,  MRN: [id number]  [name] presents to clinic today for:  Chief Complaint  Patient presents with   Diabetes    dfc   Patient notes nails are thick, discolored, elongated and painful in shoegear when trying to ambulate.    PCP is Ivonne Andrew, NP.  Allergies  Allergen Reactions   Crab [Shellfish Allergy]     itching   Review of Systems: Negative except as noted in the HPI.  Objective:  [name] is a pleasant 89 y.o. male in NAD. AAO x 3.  Vascular Examination: Capillary refill time is 3-5 seconds to toes bilateral. Palpable pedal pulses b/l LE. Digital hair present b/l. No pedal edema b/l. Skin temperature gradient WNL b/l. No varicosities b/l. No cyanosis or clubbing noted b/l.   Dermatological Examination: Pedal skin with normal turgor, texture and tone b/l. No open wounds. No interdigital macerations b/l. Toenails x10 are 3mm thick, discolored, dystrophic with subungual debris. There is pain with compression of the nail plates.  They are elongated x10     Latest Ref Rng & Units 05/15/2023    9:20 AM 02/12/2023   10:20 AM 11/11/2022   10:08 AM 11/11/2022    9:57 AM 08/09/2022   10:59 AM  Hemoglobin A1C  Hemoglobin-A1c 4.0 - 5.6 % 6.7  8.3  9.1  9.1    9.1    9.1    9.1  6.3    Assessment/Plan: 1. Pain due to onychomycosis of nail     The mycotic toenails were sharply debrided x10 with sterile nail nippers and a power debriding burr to decrease bulk/thickness and length.    Return in about 3 months (around 08/20/2023) for [facility]   Clerance Lav, DPM, FACFAS [facility]     [address] [place], [place] [address]                Office [phone]  Fax [phone]

## 2024-01-12 LAB — CUP PACEART INCLINIC DEVICE CHECK
Date Time Interrogation Session: 20250319134559
Implantable Lead Connection Status: 753985
Implantable Lead Connection Status: 753985
Implantable Lead Connection Status: 753985
Implantable Lead Implant Date: 20060509
Implantable Lead Implant Date: 20060509
Implantable Lead Implant Date: 20090417
Implantable Lead Location: 753858
Implantable Lead Location: 753859
Implantable Lead Location: 753860
Implantable Lead Model: 5076
Implantable Lead Model: 7001
Implantable Pulse Generator Implant Date: 20211220
Pulse Gen Serial Number: 9905796

## 2024-02-12 NOTE — Addendum Note (Signed)
 Addended by: Edra Govern D on: 02/12/2024 11:50 AM   Modules accepted: Orders

## 2024-02-12 NOTE — Progress Notes (Signed)
 Remote ICD transmission.

## 2024-02-17 DIAGNOSIS — N184 Chronic kidney disease, stage 4 (severe): Secondary | ICD-10-CM | POA: Diagnosis not present

## 2024-02-17 DIAGNOSIS — D472 Monoclonal gammopathy: Secondary | ICD-10-CM | POA: Diagnosis not present

## 2024-02-17 DIAGNOSIS — I129 Hypertensive chronic kidney disease with stage 1 through stage 4 chronic kidney disease, or unspecified chronic kidney disease: Secondary | ICD-10-CM | POA: Diagnosis not present

## 2024-02-17 DIAGNOSIS — I10 Essential (primary) hypertension: Secondary | ICD-10-CM | POA: Diagnosis not present

## 2024-02-17 DIAGNOSIS — I509 Heart failure, unspecified: Secondary | ICD-10-CM | POA: Diagnosis not present

## 2024-03-02 ENCOUNTER — Ambulatory Visit (HOSPITAL_COMMUNITY)
Admission: RE | Admit: 2024-03-02 | Discharge: 2024-03-02 | Disposition: A | Discharge status: Home / Self Care | Source: Ambulatory Visit | Attending: Internal Medicine | Admitting: Internal Medicine

## 2024-03-02 ENCOUNTER — Encounter (HOSPITAL_COMMUNITY): Payer: Self-pay | Admitting: Internal Medicine

## 2024-03-02 VITALS — BP 98/60 | HR 80 | Ht 67.0 in | Wt 161.6 lb

## 2024-03-02 DIAGNOSIS — N184 Chronic kidney disease, stage 4 (severe): Secondary | ICD-10-CM | POA: Insufficient documentation

## 2024-03-02 DIAGNOSIS — I130 Hypertensive heart and chronic kidney disease with heart failure and stage 1 through stage 4 chronic kidney disease, or unspecified chronic kidney disease: Secondary | ICD-10-CM | POA: Diagnosis not present

## 2024-03-02 DIAGNOSIS — I482 Chronic atrial fibrillation, unspecified: Secondary | ICD-10-CM | POA: Diagnosis not present

## 2024-03-02 DIAGNOSIS — I2510 Atherosclerotic heart disease of native coronary artery without angina pectoris: Secondary | ICD-10-CM | POA: Diagnosis not present

## 2024-03-02 DIAGNOSIS — I252 Old myocardial infarction: Secondary | ICD-10-CM | POA: Diagnosis not present

## 2024-03-02 DIAGNOSIS — E785 Hyperlipidemia, unspecified: Secondary | ICD-10-CM | POA: Insufficient documentation

## 2024-03-02 DIAGNOSIS — Z95810 Presence of automatic (implantable) cardiac defibrillator: Secondary | ICD-10-CM | POA: Diagnosis not present

## 2024-03-02 DIAGNOSIS — Z955 Presence of coronary angioplasty implant and graft: Secondary | ICD-10-CM | POA: Insufficient documentation

## 2024-03-02 DIAGNOSIS — G252 Other specified forms of tremor: Secondary | ICD-10-CM | POA: Diagnosis not present

## 2024-03-02 DIAGNOSIS — Z7901 Long term (current) use of anticoagulants: Secondary | ICD-10-CM | POA: Diagnosis not present

## 2024-03-02 DIAGNOSIS — Z8551 Personal history of malignant neoplasm of bladder: Secondary | ICD-10-CM | POA: Diagnosis not present

## 2024-03-02 DIAGNOSIS — Z7984 Long term (current) use of oral hypoglycemic drugs: Secondary | ICD-10-CM | POA: Insufficient documentation

## 2024-03-02 DIAGNOSIS — I5022 Chronic systolic (congestive) heart failure: Secondary | ICD-10-CM | POA: Diagnosis not present

## 2024-03-02 DIAGNOSIS — I4821 Permanent atrial fibrillation: Secondary | ICD-10-CM

## 2024-03-02 NOTE — Patient Instructions (Addendum)
 No labs done today.   No medication changes were made. Please continue all current medications as prescribed.  Your physician recommends that you schedule a follow-up appointment in: 6 months. Please contact our office in September to schedule a November appointment.    If you have any questions or concerns before your next appointment please send us  a message through [place] or call our office at [phone].    TO LEAVE A MESSAGE FOR THE NURSE SELECT OPTION 2, PLEASE LEAVE A MESSAGE INCLUDING: YOUR NAME DATE OF BIRTH CALL BACK NUMBER REASON FOR CALL**this is important as we prioritize the call backs  YOU WILL RECEIVE A CALL BACK THE SAME DAY AS LONG AS YOU CALL BEFORE 4:00 PM   Do the following things EVERYDAY: Weigh yourself in the morning before breakfast. Write it down and keep it in a log. Take your medicines as prescribed Eat low salt foods--Limit salt (sodium) to 2000 mg per day.  Stay as active as you can everyday Limit all fluids for the day to less than 2 liters   At the [facility], you and your health needs are our priority. As part of our continuing mission to provide you with exceptional heart care, we have created designated Provider Care Teams. These Care Teams include your primary Cardiologist (physician) and Advanced Practice Providers (APPs- Physician Assistants and Nurse Practitioners) who all work together to provide you with the care you need, when you need it.   You may see any of the following providers on your designated Care Team at your next follow up: Dr Jules Oar Dr Peder Bourdon Dr. Mimi Alt, NP Ruddy Corral, [place] [facility] [place], [place] Dennise Fitz, NP Luster Salters, PharmD   Please be sure to bring in all your medications bottles to every appointment.    Thank you for choosing [facility]

## 2024-03-02 NOTE — Progress Notes (Signed)
 Patient ID: [name], male   DOB: 11-24-1931, 89 y.o.   MRN: [id number]    Advanced Heart Failure Clinic Note   PCP: Dr. Mamie Searles HF Cardiologist: Dr. Julane Ny  HPI: [name] is a 89 y.o. male with a history of coronary artery disease, status post previous large anterior wall myocardial infarction s/p multivessel stenting, systolic CHF with EF 25% s/p St. Jude BiV ICD.  Remainder of his medical history is notable for atrial fibrillation, hx GI bleed, chronic renal insufficiency with baseline creatinine about 2.2-2.5, hypertension, hyperlipidemia, and a systemic tremor. Bladder cancer 05/2012. Completed BCG treatment.   2015 found that his left ventricular lead has not been capturing suggestive of either fracture of the proximal electrode or an issue at the header. They reprogrammed the device to use to the RV coil which he tolerated well and had a good threshold.   Echo 7/23 EF 20-25%, LV global HK, RV mildly reduced, moderate MR and TR  Admitted 10/23 with a/c CHF in setting of indiscretion with sodium intake. He was diuresed with IV lasix . Initially coreg  and farxiga  held, but added back on discharge. ReDS at discharge 34%.  Today he returns for HF follow up with his wife. Says he is doing OK. Main complaint is bilateral back pain under his ribs.  Remains active. Mowing the grass, weed-eating and going to store. No CP or undue SOB. No edema, orthopnea or PND.   Complaint with meds. No bleeding with Eliquis . BP remains soft.   Review of systems complete and found to be negative unless listed in HPI.    Past Medical History:  Diagnosis Date   Acute bronchitis with COPD (HCC) 09/10/2016   Anemia    Atrial fibrillation or flutter    maintaining sinus on amiodarone      Bladder cancer (HCC) dx'd 05/2012   BPH (benign prostatic hyperplasia)    CAD (coronary artery disease)     a. s/p anterior MI 12/05 c/b shock -> stent LAD   b. s/p stenting OM-1, 2/06   CHF (congestive  heart failure) (HCC)    due to ischemic CM  a. EF 20-30%. (Nov 2008)   b. s/p St. Jude BiV-ICD    c. 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/16/2013   Major depression, single episode, in complete remission (HCC) 08/16/2016   Neuromuscular disorder (HCC)    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"   no neurologist   Osteoarthritis 03/08/2011   Skin cancer    basal cells   facial x 4, 1 right forearm   Sleep apnea    STOP BANG SCORE 4   Current Outpatient Medications  Medication Sig Dispense Refill   acetaminophen  (TYLENOL ) 500 MG tablet Take 500-1,000 mg by mouth every 6 (six) hours as needed (for pain).     carboxymethylcellulose (REFRESH PLUS) 0.5 % SOLN Place 1 drop into both eyes 3 (three) times daily as needed (dry/irritated eyes.).  carvedilol  (COREG ) 3.125 MG tablet TAKE 1 TABLET BY MOUTH TWICE A DAY WITH A MEAL 180 tablet 3   ELIQUIS  2.5 MG TABS tablet TAKE 1 TABLET BY MOUTH TWICE A DAY 60 tablet 11   ezetimibe  (ZETIA ) 10 MG tablet Take 10 mg by mouth daily with supper.     FARXIGA  10 MG TABS tablet TAKE 1 TABLET BY MOUTH EVERY DAY BEFORE BREAKFAST 90 tablet 3   ferrous sulfate  325 (65 FE) MG tablet Take 325 mg by mouth daily with supper.     furosemide  (LASIX ) 20 MG tablet Take 1 tablet (20 mg total) by mouth 2 (two) times daily. Take an extra dose of 20mg  for weight gain-3lbs in 1 day or 5lbs in 1 week 60 tablet 1   levothyroxine  (SYNTHROID ) 75 MCG tablet TAKE 1 TABLET BY MOUTH EVERY DAY BEFORE  BREAKFAST 90 tablet 1   Multiple Vitamin (MULTIVITAMIN WITH MINERALS) TABS tablet Take 1 tablet by mouth daily with supper. Centrum Silver     nitroGLYCERIN  (NITROSTAT ) 0.4 MG SL tablet DISSOLVE ONE TABLET UNDER THE TONGUE EVERY 5 MINUTES AS NEEDED FOR CHEST PAIN.  DO NOT EXCEED A TOTAL OF 3 DOSES IN 15 MINUTES 25 tablet 0   pantoprazole  (PROTONIX ) 40 MG tablet Take 40 mg by mouth every evening.     potassium chloride  20 MEQ/15ML (10%) SOLN 7.5 ML WITH FOOD ORALLY ONCE A DAY 90 DAYS     rosuvastatin  (CRESTOR ) 20 MG tablet Take 20 mg by mouth every evening.     sodium chloride  (OCEAN) 0.65 % SOLN nasal spray Place 1 spray into both nostrils as needed for congestion.     tamsulosin  (FLOMAX ) 0.4 MG CAPS capsule Take 0.4 mg by mouth at bedtime.      triamcinolone (NASACORT) 55 MCG/ACT AERO nasal inhaler Place 2 sprays into the nose daily as needed.     No current facility-administered medications for this encounter.   Wt Readings from Last 3 Encounters:  03/02/24 73.3 kg (161 lb 9.6 oz)  12/31/23 71.7 kg (158 lb)  04/22/23 71.6 kg (157 lb 12.8 oz)    BP 98/60   Pulse 80   Ht 5\' 7"  (1.702 m)   Wt 73.3 kg (161 lb 9.6 oz)   SpO2 97%   BMI 25.31 kg/m   Physical Exam General:  Elderly No resp difficulty HEENT: normal Neck: supple. no JVD. Carotids 2+ bilat; no bruits. No lymphadenopathy or thryomegaly appreciated. Cor: PMI nondisplaced. Irregular rate & rhythm. No rubs, gallops or murmurs. Lungs: clear Abdomen: soft, nontender, nondistended. No hepatosplenomegaly. No bruits or masses. Good bowel sounds. Extremities: no cyanosis, clubbing, rash, edema Neuro: alert & orientedx3, cranial nerves grossly intact. moves all 4 extremities w/o difficulty. Affect pleasant   Device interrogation Not done today. Personally reviewed  ASSESSMENT & PLAN:  1. Chronic systolic CHF - S/p BIV ICD - Echo 10/13/2017 LVEF 20-25%  - Echo 2/19 EF 20-25% - Echo 07/23: EF 20-25%, RV mildly reduced, moderate  MR - Stable NYHA II-III - Volume looks good.  - Continue lasix  20 mg bid. We reviewed sliding scale diuretics.  - Continue Coreg  3.125 mg bid. (cut back due to low BP) - Continue Farxiga  10 mg daily. - No ARB.MRA with CKD IV and low BP. Will not titrate GDMT today   2. CAD - No s/s ischemia - Off ASA. On Eliquis  - Continue statin + Zetia . LDL goal < 70 - Lipids followed by Dr. Mamie Searles.   3. Chronic AF -  Remains in rate controlled AF - Continue Eliquis  2.5 bid - No bleeding  4. CKD, Stage IV  - Creatinine baseline 2.2-2.3  - Follows with Dr. Jearldine Mina - Continue Farxiga  - Labs stable per last check last week   Jules Oar, MD  3:06 PM

## 2024-03-29 ENCOUNTER — Ambulatory Visit (INDEPENDENT_AMBULATORY_CARE_PROVIDER_SITE_OTHER): Payer: Medicare Other

## 2024-03-29 DIAGNOSIS — I255 Ischemic cardiomyopathy: Secondary | ICD-10-CM

## 2024-03-29 DIAGNOSIS — I5022 Chronic systolic (congestive) heart failure: Secondary | ICD-10-CM

## 2024-03-29 LAB — CUP PACEART REMOTE DEVICE CHECK
Battery Remaining Longevity: 31 mo
Battery Remaining Percentage: 43 %
Battery Voltage: 2.92 V
Date Time Interrogation Session: 20250616020018
HighPow Impedance: 49 Ohm
HighPow Impedance: 49 Ohm
Implantable Lead Connection Status: 753985
Implantable Lead Connection Status: 753985
Implantable Lead Connection Status: 753985
Implantable Lead Implant Date: 20060509
Implantable Lead Implant Date: 20060509
Implantable Lead Implant Date: 20090417
Implantable Lead Location: 753858
Implantable Lead Location: 753859
Implantable Lead Location: 753860
Implantable Lead Model: 5076
Implantable Lead Model: 7001
Implantable Pulse Generator Implant Date: 20211220
Lead Channel Impedance Value: 390 Ohm
Lead Channel Impedance Value: 400 Ohm
Lead Channel Impedance Value: 510 Ohm
Lead Channel Pacing Threshold Amplitude: 1 V
Lead Channel Pacing Threshold Amplitude: 1.125 V
Lead Channel Pacing Threshold Pulse Width: 0.8 ms
Lead Channel Pacing Threshold Pulse Width: 1 ms
Lead Channel Sensing Intrinsic Amplitude: 0.6 mV
Lead Channel Sensing Intrinsic Amplitude: 10.4 mV
Lead Channel Setting Pacing Amplitude: 1.75 V
Lead Channel Setting Pacing Amplitude: 2.5 V
Lead Channel Setting Pacing Pulse Width: 0.8 ms
Lead Channel Setting Pacing Pulse Width: 1 ms
Lead Channel Setting Sensing Sensitivity: 0.5 mV
Pulse Gen Serial Number: 9905796

## 2024-03-30 ENCOUNTER — Ambulatory Visit: Payer: Self-pay | Admitting: Cardiology

## 2024-04-12 ENCOUNTER — Ambulatory Visit (INDEPENDENT_AMBULATORY_CARE_PROVIDER_SITE_OTHER): Admitting: Podiatry

## 2024-04-12 DIAGNOSIS — B351 Tinea unguium: Secondary | ICD-10-CM

## 2024-04-12 DIAGNOSIS — M79674 Pain in right toe(s): Secondary | ICD-10-CM

## 2024-04-12 DIAGNOSIS — M79675 Pain in left toe(s): Secondary | ICD-10-CM | POA: Diagnosis not present

## 2024-04-12 NOTE — Progress Notes (Signed)
 Subjective:  Patient ID: [name], male    DOB: 1932-04-18,  MRN: [id number]   [name] presents to clinic today for:  Chief Complaint  Patient presents with   Nail Problem    Nail trim    Patient notes nails are thick, discolored, elongated and painful in shoegear when trying to ambulate.    PCP is Onita Rush, MD.  Past Medical History:  Diagnosis Date   Acute bronchitis with COPD (HCC) 09/10/2016   Anemia    Atrial fibrillation or flutter    maintaining sinus on amiodarone      Bladder cancer (HCC) dx'd 05/2012   BPH (benign prostatic hyperplasia)    CAD (coronary artery disease)     a. s/p anterior MI 12/05 c/b shock -> stent LAD   b. s/p stenting OM-1, 2/06   CHF (congestive heart failure) (HCC)    due to ischemic CM  a. EF 20-30%. (Nov 2008)   b. s/p St. Jude BiV-ICD    c. 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/16/2013   Major depression, single episode, in complete remission (HCC) 08/16/2016   Neuromuscular disorder (HCC)    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   no neurologist   Osteoarthritis 03/08/2011   Skin cancer    basal cells   facial x 4, 1 right forearm   Sleep apnea    STOP BANG SCORE 4    Past Surgical History:  Procedure Laterality Date   BACK SURGERY  1972   lower back   BIV ICD GENERATOR CHANGEOUT N/A 10/02/2020   Procedure: BIV ICD GENERATOR CHANGEOUT;  Surgeon: Fernande Elspeth BROCKS, MD;  Location: [facility] INVASIVE CV LAB;  Service: Cardiovascular;   Laterality: N/A;   CARDIAC DEFIBRILLATOR PLACEMENT     ICD St Jude; gen change 09-20-13   CERVICAL LAMINECTOMY  1985   COLONOSCOPY     CORONARY ANGIOPLASTY  2005,2006   CYSTOSCOPY W/ RETROGRADES  05/25/2012   Procedure: CYSTOSCOPY WITH RETROGRADE PYELOGRAM;  Surgeon: Toribio Neysa Repine, MD;  Location: [facility];  Service: Urology;  Laterality: Bilateral;   CYSTOSCOPY W/ URETERAL STENT PLACEMENT  07/08/2012   Procedure: CYSTOSCOPY WITH STENT REPLACEMENT;  Surgeon: Toribio Neysa Repine, MD;  Location: [facility];  Service: Urology;  Laterality: Left;   CYSTOSCOPY W/ URETERAL STENT REMOVAL  07/08/2012   Procedure: CYSTOSCOPY WITH STENT REMOVAL;  Surgeon: Toribio Neysa Repine, MD;  Location: [facility];  Service: Urology;  Laterality: Bilateral;   CYSTOSCOPY/RETROGRADE/URETEROSCOPY  07/08/2012   Procedure: CYSTOSCOPY/RETROGRADE/URETEROSCOPY;  Surgeon: Toribio Neysa Repine, MD;  Location: [facility];  Service: Urology;  Laterality: Left;   ESOPHAGOGASTRODUODENOSCOPY     HERNIA REPAIR  1989   bilateral   IMPLANTABLE CARDIOVERTER DEFIBRILLATOR (ICD) GENERATOR CHANGE N/A 09/20/2013   Procedure: ICD GENERATOR CHANGE;  Surgeon: Elspeth BROCKS Fernande, MD;  Location: [facility] CATH LAB;  Service: Cardiovascular;  Laterality: N/A;   TEE WITHOUT CARDIOVERSION N/A  12/10/2017   Procedure: TRANSESOPHAGEAL ECHOCARDIOGRAM (TEE);  Surgeon: Jeffrie Oneil BROCKS, MD;  Location: [facility] ENDOSCOPY;  Service: Cardiovascular;  Laterality: N/A;   TRANSURETHRAL RESECTION OF BLADDER TUMOR  05/25/2012   cold cup biopsy prostate   TRANSURETHRAL RESECTION OF BLADDER TUMOR  07/08/2012   Procedure: TRANSURETHRAL RESECTION OF BLADDER TUMOR (TURBT);  Surgeon: Toribio Neysa Repine, MD;  Location: [facility];  Service: Urology;  Laterality: N/A;  CYSTO, TURBT W/ GYRUS, BILATERAL STENT REMOVAL, LEFT URETEROSCOPY WITH BIOPSY, POSSIBLE LEFT STENT PLACEMENT     Allergies  Allergen Reactions   Amoxicillin Hives and Rash   Gabapentin Itching   Penicillin G Sodium Itching  and Rash   Penicillins Itching and Rash    Has patient had a PCN reaction causing immediate rash, facial/tongue/throat swelling, SOB or lightheadedness with hypotension: No Has patient had a PCN reaction causing severe rash involving mucus membranes or skin necrosis: Yes-back and hands. Has patient had a PCN reaction that required hospitalization: No Has patient had a PCN reaction occurring within the last 10 years: Yes If all of the above answers are NO, then may proceed with Cephalosporin use.   Ramipril Cough    Review of Systems: Negative except as noted in the HPI.  Objective:  [name] is a pleasant 89 y.o. male in NAD. AAO x 3.  Vascular Examination: Capillary refill time is 3-5 seconds to toes bilateral.  Trace palpable pedal pulses b/l LE. Digital hair absent b/l.  Skin temperature gradient WNL b/l. No varicosities b/l. No cyanosis noted b/l.   Dermatological Examination: Pedal skin with decrease in skin turgor, texture and tone b/l. No open wounds. No interdigital macerations b/l. Toenails x10 are 3mm thick, discolored, dystrophic with subungual debris. There is pain with compression of the nail plates.  They are elongated x10  Assessment/Plan: 1. Pain due to onychomycosis of toenails of both feet    The mycotic toenails were sharply debrided x10 with sterile nail nippers and a power debriding burr to decrease bulk/thickness and length.    Return in about 3 months (around 07/13/2024) for RFC.   [name], DPM, FACFAS [facility]     [address] [place], [place] [address]                Office [phone]  Fax [phone]

## 2024-04-22 DIAGNOSIS — I739 Peripheral vascular disease, unspecified: Secondary | ICD-10-CM | POA: Diagnosis not present

## 2024-04-22 DIAGNOSIS — N184 Chronic kidney disease, stage 4 (severe): Secondary | ICD-10-CM | POA: Diagnosis not present

## 2024-04-22 DIAGNOSIS — J449 Chronic obstructive pulmonary disease, unspecified: Secondary | ICD-10-CM | POA: Diagnosis not present

## 2024-04-22 DIAGNOSIS — I2510 Atherosclerotic heart disease of native coronary artery without angina pectoris: Secondary | ICD-10-CM | POA: Diagnosis not present

## 2024-04-22 DIAGNOSIS — I5022 Chronic systolic (congestive) heart failure: Secondary | ICD-10-CM | POA: Diagnosis not present

## 2024-04-22 DIAGNOSIS — R739 Hyperglycemia, unspecified: Secondary | ICD-10-CM | POA: Diagnosis not present

## 2024-04-22 DIAGNOSIS — I130 Hypertensive heart and chronic kidney disease with heart failure and stage 1 through stage 4 chronic kidney disease, or unspecified chronic kidney disease: Secondary | ICD-10-CM | POA: Diagnosis not present

## 2024-04-22 DIAGNOSIS — I255 Ischemic cardiomyopathy: Secondary | ICD-10-CM | POA: Diagnosis not present

## 2024-04-22 DIAGNOSIS — E46 Unspecified protein-calorie malnutrition: Secondary | ICD-10-CM | POA: Diagnosis not present

## 2024-04-22 DIAGNOSIS — F325 Major depressive disorder, single episode, in full remission: Secondary | ICD-10-CM | POA: Diagnosis not present

## 2024-04-22 DIAGNOSIS — E785 Hyperlipidemia, unspecified: Secondary | ICD-10-CM | POA: Diagnosis not present

## 2024-04-22 DIAGNOSIS — I480 Paroxysmal atrial fibrillation: Secondary | ICD-10-CM | POA: Diagnosis not present

## 2024-04-27 DIAGNOSIS — Z4589 Encounter for adjustment and management of other implanted devices: Secondary | ICD-10-CM | POA: Diagnosis not present

## 2024-04-27 DIAGNOSIS — H6522 Chronic serous otitis media, left ear: Secondary | ICD-10-CM | POA: Diagnosis not present

## 2024-04-27 DIAGNOSIS — H9190 Unspecified hearing loss, unspecified ear: Secondary | ICD-10-CM | POA: Diagnosis not present

## 2024-05-05 DIAGNOSIS — H9190 Unspecified hearing loss, unspecified ear: Secondary | ICD-10-CM | POA: Diagnosis not present

## 2024-05-05 DIAGNOSIS — H6522 Chronic serous otitis media, left ear: Secondary | ICD-10-CM | POA: Diagnosis not present

## 2024-05-06 NOTE — Progress Notes (Signed)
 Remote ICD transmission.

## 2024-05-30 ENCOUNTER — Other Ambulatory Visit (HOSPITAL_COMMUNITY): Payer: Self-pay | Admitting: Internal Medicine

## 2024-06-01 ENCOUNTER — Other Ambulatory Visit (HOSPITAL_COMMUNITY): Payer: Self-pay

## 2024-06-07 DIAGNOSIS — H903 Sensorineural hearing loss, bilateral: Secondary | ICD-10-CM | POA: Diagnosis not present

## 2024-06-09 DIAGNOSIS — H6522 Chronic serous otitis media, left ear: Secondary | ICD-10-CM | POA: Diagnosis not present

## 2024-06-09 DIAGNOSIS — H903 Sensorineural hearing loss, bilateral: Secondary | ICD-10-CM | POA: Diagnosis not present

## 2024-06-22 ENCOUNTER — Other Ambulatory Visit (HOSPITAL_COMMUNITY): Payer: Self-pay | Admitting: Internal Medicine

## 2024-06-28 ENCOUNTER — Ambulatory Visit (INDEPENDENT_AMBULATORY_CARE_PROVIDER_SITE_OTHER): Payer: Medicare Other

## 2024-06-28 DIAGNOSIS — I5022 Chronic systolic (congestive) heart failure: Secondary | ICD-10-CM | POA: Diagnosis not present

## 2024-06-29 LAB — CUP PACEART REMOTE DEVICE CHECK
Battery Remaining Longevity: 29 mo
Battery Remaining Percentage: 39 %
Battery Voltage: 2.92 V
Date Time Interrogation Session: 20250915020017
HighPow Impedance: 51 Ohm
HighPow Impedance: 51 Ohm
Implantable Lead Connection Status: 753985
Implantable Lead Connection Status: 753985
Implantable Lead Connection Status: 753985
Implantable Lead Implant Date: 20060509
Implantable Lead Implant Date: 20060509
Implantable Lead Implant Date: 20090417
Implantable Lead Location: 753858
Implantable Lead Location: 753859
Implantable Lead Location: 753860
Implantable Lead Model: 5076
Implantable Lead Model: 7001
Implantable Pulse Generator Implant Date: 20211220
Lead Channel Impedance Value: 400 Ohm
Lead Channel Impedance Value: 430 Ohm
Lead Channel Impedance Value: 450 Ohm
Lead Channel Pacing Threshold Amplitude: 1 V
Lead Channel Pacing Threshold Amplitude: 1.25 V
Lead Channel Pacing Threshold Pulse Width: 0.8 ms
Lead Channel Pacing Threshold Pulse Width: 1 ms
Lead Channel Sensing Intrinsic Amplitude: 0.6 mV
Lead Channel Sensing Intrinsic Amplitude: 12 mV
Lead Channel Setting Pacing Amplitude: 1.75 V
Lead Channel Setting Pacing Amplitude: 2.5 V
Lead Channel Setting Pacing Pulse Width: 0.8 ms
Lead Channel Setting Pacing Pulse Width: 1 ms
Lead Channel Setting Sensing Sensitivity: 0.5 mV
Pulse Gen Serial Number: 9905796

## 2024-07-01 ENCOUNTER — Ambulatory Visit: Payer: Self-pay | Admitting: Cardiology

## 2024-07-05 NOTE — Progress Notes (Signed)
Remote ICD Transmission.

## 2024-07-12 ENCOUNTER — Ambulatory Visit: Admitting: Podiatry

## 2024-07-12 ENCOUNTER — Encounter: Payer: Self-pay | Admitting: Podiatry

## 2024-07-12 DIAGNOSIS — M79674 Pain in right toe(s): Secondary | ICD-10-CM

## 2024-07-12 DIAGNOSIS — B351 Tinea unguium: Secondary | ICD-10-CM | POA: Diagnosis not present

## 2024-07-12 DIAGNOSIS — M79675 Pain in left toe(s): Secondary | ICD-10-CM | POA: Diagnosis not present

## 2024-07-12 NOTE — Progress Notes (Signed)
 Subjective:  Patient ID: [name], male    DOB: [birth year],  MRN: [id number]   [name] presents to clinic today for:  Chief Complaint  Patient presents with   [facility]    RFC Non diabetic toenail trim. 0 pain.   Patient notes nails are thick, discolored, elongated and painful in shoegear when trying to ambulate.    PCP is Onita Rush, MD.  Past Medical History:  Diagnosis Date   Acute bronchitis with COPD (HCC) 09/10/2016   Anemia    Atrial fibrillation or flutter    maintaining sinus on amiodarone      Bladder cancer (HCC) dx'd 05/2012   BPH (benign prostatic hyperplasia)    CAD (coronary artery disease)     a. s/p anterior MI 12/05 c/b shock -> stent LAD   b. s/p stenting OM-1, 2/06   CHF (congestive heart failure) (HCC)    due to ischemic CM  a. EF 20-30%. (Nov 2008)   b. s/p St. Jude BiV-ICD    c. 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/16/2013   Major depression, single episode, in complete remission 08/16/2016   Neuromuscular disorder (HCC)    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   no neurologist   Osteoarthritis 03/08/2011   Skin cancer    basal cells   facial x 4, 1 right forearm   Sleep apnea    STOP BANG SCORE 4    Past Surgical History:  Procedure Laterality Date   BACK SURGERY  1972   lower back   BIV ICD GENERATOR CHANGEOUT N/A 10/02/2020   Procedure: BIV ICD GENERATOR CHANGEOUT;  Surgeon: Fernande Elspeth BROCKS, MD;  Location: [facility] INVASIVE CV LAB;  Service:  Cardiovascular;  Laterality: N/A;   CARDIAC DEFIBRILLATOR PLACEMENT     ICD St Jude; gen change 09-20-13   CERVICAL LAMINECTOMY  1985   COLONOSCOPY     CORONARY ANGIOPLASTY  2005,2006   CYSTOSCOPY W/ RETROGRADES  05/25/2012   Procedure: CYSTOSCOPY WITH RETROGRADE PYELOGRAM;  Surgeon: Toribio Neysa Repine, MD;  Location: [facility];  Service: Urology;  Laterality: Bilateral;   CYSTOSCOPY W/ URETERAL STENT PLACEMENT  07/08/2012   Procedure: CYSTOSCOPY WITH STENT REPLACEMENT;  Surgeon: Toribio Neysa Repine, MD;  Location: [facility];  Service: Urology;  Laterality: Left;   CYSTOSCOPY W/ URETERAL STENT REMOVAL  07/08/2012   Procedure: CYSTOSCOPY WITH STENT REMOVAL;  Surgeon: Toribio Neysa Repine, MD;  Location: [facility];  Service: Urology;  Laterality: Bilateral;   CYSTOSCOPY/RETROGRADE/URETEROSCOPY  07/08/2012   Procedure: CYSTOSCOPY/RETROGRADE/URETEROSCOPY;  Surgeon: Toribio Neysa Repine, MD;  Location: [facility];  Service: Urology;  Laterality: Left;   ESOPHAGOGASTRODUODENOSCOPY     HERNIA REPAIR  1989   bilateral   IMPLANTABLE CARDIOVERTER DEFIBRILLATOR (ICD) GENERATOR CHANGE N/A 09/20/2013   Procedure: ICD GENERATOR CHANGE;  Surgeon: Elspeth BROCKS Fernande, MD;  Location: [facility] CATH LAB;  Service: Cardiovascular;  Laterality: N/A;   TEE WITHOUT  CARDIOVERSION N/A 12/10/2017   Procedure: TRANSESOPHAGEAL ECHOCARDIOGRAM (TEE);  Surgeon: Jeffrie Oneil BROCKS, MD;  Location: [facility] ENDOSCOPY;  Service: Cardiovascular;  Laterality: N/A;   TRANSURETHRAL RESECTION OF BLADDER TUMOR  05/25/2012   cold cup biopsy prostate   TRANSURETHRAL RESECTION OF BLADDER TUMOR  07/08/2012   Procedure: TRANSURETHRAL RESECTION OF BLADDER TUMOR (TURBT);  Surgeon: Toribio Neysa Repine, MD;  Location: [facility];  Service: Urology;  Laterality: N/A;  CYSTO, TURBT W/ GYRUS, BILATERAL STENT REMOVAL, LEFT URETEROSCOPY WITH BIOPSY, POSSIBLE LEFT STENT PLACEMENT     Allergies  Allergen Reactions   Amoxicillin Hives and Rash   Gabapentin Itching   Penicillin G  Sodium Itching and Rash   Penicillins Itching and Rash    Has patient had a PCN reaction causing immediate rash, facial/tongue/throat swelling, SOB or lightheadedness with hypotension: No Has patient had a PCN reaction causing severe rash involving mucus membranes or skin necrosis: Yes-back and hands. Has patient had a PCN reaction that required hospitalization: No Has patient had a PCN reaction occurring within the last 10 years: Yes If all of the above answers are NO, then may proceed with Cephalosporin use.   Ramipril Cough    Review of Systems: Negative except as noted in the HPI.  Objective:  [name] is a pleasant 89 y.o. male in NAD. AAO x 3.  Vascular Examination: Capillary refill time is 3-5 seconds to toes bilateral.  Trace palpable pedal pulses b/l LE. Digital hair absent b/l.  Skin temperature gradient WNL b/l. No varicosities b/l. No cyanosis noted b/l.   Dermatological Examination: Pedal skin with decrease in skin turgor, texture and tone b/l. No open wounds. No interdigital macerations b/l. Toenails x10 are 3mm thick, discolored, dystrophic with subungual debris. There is pain with compression of the nail plates.  They are elongated x10  Assessment/Plan: 1. Pain due to onychomycosis of toenails of both feet    The mycotic toenails were sharply debrided x10 with sterile nail nippers and a power debriding burr to decrease bulk/thickness and length.    Return in about 3 months (around 10/11/2024) for RFC.   [name], DPM, FACFAS [facility]     [address] [place], [place] [address]                Office [phone]  Fax [phone]

## 2024-07-14 ENCOUNTER — Other Ambulatory Visit (HOSPITAL_COMMUNITY): Payer: Self-pay | Admitting: Internal Medicine

## 2024-08-24 DIAGNOSIS — N1832 Chronic kidney disease, stage 3b: Secondary | ICD-10-CM | POA: Diagnosis not present

## 2024-08-24 DIAGNOSIS — I10 Essential (primary) hypertension: Secondary | ICD-10-CM | POA: Diagnosis not present

## 2024-08-24 DIAGNOSIS — I509 Heart failure, unspecified: Secondary | ICD-10-CM | POA: Diagnosis not present

## 2024-08-24 DIAGNOSIS — I129 Hypertensive chronic kidney disease with stage 1 through stage 4 chronic kidney disease, or unspecified chronic kidney disease: Secondary | ICD-10-CM | POA: Diagnosis not present

## 2024-08-24 DIAGNOSIS — Z23 Encounter for immunization: Secondary | ICD-10-CM | POA: Diagnosis not present

## 2024-09-27 ENCOUNTER — Ambulatory Visit: Payer: Medicare Other

## 2024-09-27 DIAGNOSIS — I5022 Chronic systolic (congestive) heart failure: Secondary | ICD-10-CM | POA: Diagnosis not present

## 2024-09-28 LAB — CUP PACEART REMOTE DEVICE CHECK
Battery Remaining Longevity: 26 mo
Battery Remaining Percentage: 36 %
Battery Voltage: 2.9 V
Date Time Interrogation Session: 20251215020018
HighPow Impedance: 51 Ohm
HighPow Impedance: 51 Ohm
Implantable Lead Connection Status: 753985
Implantable Lead Connection Status: 753985
Implantable Lead Connection Status: 753985
Implantable Lead Implant Date: 20060509
Implantable Lead Implant Date: 20060509
Implantable Lead Implant Date: 20090417
Implantable Lead Location: 753858
Implantable Lead Location: 753859
Implantable Lead Location: 753860
Implantable Lead Model: 5076
Implantable Lead Model: 7001
Implantable Pulse Generator Implant Date: 20211220
Lead Channel Impedance Value: 400 Ohm
Lead Channel Impedance Value: 410 Ohm
Lead Channel Impedance Value: 430 Ohm
Lead Channel Pacing Threshold Amplitude: 1 V
Lead Channel Pacing Threshold Amplitude: 1.125 V
Lead Channel Pacing Threshold Pulse Width: 0.8 ms
Lead Channel Pacing Threshold Pulse Width: 1 ms
Lead Channel Sensing Intrinsic Amplitude: 0.6 mV
Lead Channel Sensing Intrinsic Amplitude: 11.7 mV
Lead Channel Setting Pacing Amplitude: 1.75 V
Lead Channel Setting Pacing Amplitude: 2.5 V
Lead Channel Setting Pacing Pulse Width: 0.8 ms
Lead Channel Setting Pacing Pulse Width: 1 ms
Lead Channel Setting Sensing Sensitivity: 0.5 mV
Pulse Gen Serial Number: 9905796

## 2024-10-01 NOTE — Progress Notes (Signed)
 Remote ICD Transmission

## 2024-10-11 ENCOUNTER — Ambulatory Visit: Admitting: Podiatry

## 2024-10-11 ENCOUNTER — Encounter: Payer: Self-pay | Admitting: Podiatry

## 2024-10-11 DIAGNOSIS — M79675 Pain in left toe(s): Secondary | ICD-10-CM

## 2024-10-11 DIAGNOSIS — M79674 Pain in right toe(s): Secondary | ICD-10-CM

## 2024-10-11 DIAGNOSIS — B351 Tinea unguium: Secondary | ICD-10-CM

## 2024-10-11 NOTE — Progress Notes (Unsigned)
 Nails x 10

## 2024-12-02 ENCOUNTER — Ambulatory Visit (HOSPITAL_COMMUNITY): Admitting: Internal Medicine

## 2025-01-10 ENCOUNTER — Ambulatory Visit: Admitting: Podiatry

## 2025-01-12 ENCOUNTER — Ambulatory Visit: Admitting: Podiatry

## 2025-04-13 ENCOUNTER — Ambulatory Visit: Admitting: Podiatry
